# Patient Record
Sex: Female | Born: 1980 | Race: Black or African American | Hispanic: No | Marital: Single | Smoking: Current some day smoker
Health system: Southern US, Community
[De-identification: ages and names within clinical notes are randomized; demographics above are authoritative.]

## PROBLEM LIST (undated history)

## (undated) ENCOUNTER — Emergency Department: Admission: EM | Discharge status: Absent without leave | Payer: Medicaid Other

## (undated) DIAGNOSIS — R519 Headache, unspecified: Secondary | ICD-10-CM

## (undated) DIAGNOSIS — R112 Nausea with vomiting, unspecified: Secondary | ICD-10-CM

## (undated) DIAGNOSIS — I10 Essential (primary) hypertension: Secondary | ICD-10-CM

## (undated) DIAGNOSIS — M1990 Unspecified osteoarthritis, unspecified site: Secondary | ICD-10-CM

## (undated) DIAGNOSIS — K859 Acute pancreatitis without necrosis or infection, unspecified: Secondary | ICD-10-CM

## (undated) DIAGNOSIS — J45909 Unspecified asthma, uncomplicated: Secondary | ICD-10-CM

## (undated) DIAGNOSIS — G894 Chronic pain syndrome: Secondary | ICD-10-CM

## (undated) DIAGNOSIS — N739 Female pelvic inflammatory disease, unspecified: Secondary | ICD-10-CM

## (undated) DIAGNOSIS — R51 Headache: Secondary | ICD-10-CM

## (undated) DIAGNOSIS — K219 Gastro-esophageal reflux disease without esophagitis: Secondary | ICD-10-CM

## (undated) DIAGNOSIS — D259 Leiomyoma of uterus, unspecified: Secondary | ICD-10-CM

## (undated) DIAGNOSIS — R011 Cardiac murmur, unspecified: Secondary | ICD-10-CM

## (undated) DIAGNOSIS — E119 Type 2 diabetes mellitus without complications: Secondary | ICD-10-CM

## (undated) DIAGNOSIS — I509 Heart failure, unspecified: Secondary | ICD-10-CM

## (undated) DIAGNOSIS — Z8619 Personal history of other infectious and parasitic diseases: Secondary | ICD-10-CM

## (undated) HISTORY — DX: Chronic pain syndrome: G89.4

## (undated) HISTORY — DX: Leiomyoma of uterus, unspecified: D25.9

## (undated) HISTORY — PX: KNEE ARTHROSCOPY: SHX127

## (undated) HISTORY — DX: Female pelvic inflammatory disease, unspecified: N73.9

## (undated) HISTORY — PX: ABDOMINAL HYSTERECTOMY: SHX81

## (undated) HISTORY — PX: DENTAL SURGERY: SHX609

## (undated) HISTORY — PX: CHOLECYSTECTOMY: SHX55

## (undated) HISTORY — PX: OTHER SURGICAL HISTORY: SHX169

## (undated) HISTORY — DX: Personal history of other infectious and parasitic diseases: Z86.19

---

## 2017-05-04 ENCOUNTER — Emergency Department
Admission: EM | Admit: 2017-05-04 | Discharge: 2017-05-04 | Disposition: A | Discharge status: Home / Self Care | Payer: No Typology Code available for payment source | Attending: Emergency Medicine | Admitting: Emergency Medicine

## 2017-05-04 ENCOUNTER — Encounter: Payer: Self-pay | Admitting: Emergency Medicine

## 2017-05-04 DIAGNOSIS — F1123 Opioid dependence with withdrawal: Secondary | ICD-10-CM | POA: Insufficient documentation

## 2017-05-04 DIAGNOSIS — F17210 Nicotine dependence, cigarettes, uncomplicated: Secondary | ICD-10-CM | POA: Insufficient documentation

## 2017-05-04 DIAGNOSIS — J45909 Unspecified asthma, uncomplicated: Secondary | ICD-10-CM | POA: Diagnosis not present

## 2017-05-04 DIAGNOSIS — I10 Essential (primary) hypertension: Secondary | ICD-10-CM | POA: Insufficient documentation

## 2017-05-04 DIAGNOSIS — F1193 Opioid use, unspecified with withdrawal: Secondary | ICD-10-CM

## 2017-05-04 DIAGNOSIS — Z79899 Other long term (current) drug therapy: Secondary | ICD-10-CM | POA: Diagnosis not present

## 2017-05-04 DIAGNOSIS — M791 Myalgia, unspecified site: Secondary | ICD-10-CM | POA: Diagnosis present

## 2017-05-04 HISTORY — DX: Essential (primary) hypertension: I10

## 2017-05-04 HISTORY — DX: Unspecified asthma, uncomplicated: J45.909

## 2017-05-04 HISTORY — DX: Unspecified osteoarthritis, unspecified site: M19.90

## 2017-05-04 MED ORDER — IBUPROFEN 600 MG PO TABS: 600.0000 mg | ORAL_TABLET | Freq: Once | ORAL | Status: DC

## 2017-05-04 MED ORDER — ONDANSETRON 4 MG PO TBDP
4.0000 mg | ORAL_TABLET | Freq: Once | ORAL | Status: AC
  Administered 2017-05-04: 4 mg via ORAL
  Filled 2017-05-04: qty 1

## 2017-05-04 MED ORDER — CYCLOBENZAPRINE HCL 10 MG PO TABS
10.0000 mg | ORAL_TABLET | Freq: Once | ORAL | Status: AC
  Administered 2017-05-04: 10 mg via ORAL
  Filled 2017-05-04: qty 1

## 2017-05-04 MED ORDER — CLONIDINE HCL 0.1 MG PO TABS
0.2000 mg | ORAL_TABLET | Freq: Once | ORAL | Status: AC
  Administered 2017-05-04: 0.2 mg via ORAL
  Filled 2017-05-04: qty 2

## 2017-05-04 MED ORDER — CLONIDINE HCL 0.2 MG PO TABS: 0.2000 mg | ORAL_TABLET | Freq: Two times a day (BID) | ORAL | 0 refills | Status: DC

## 2017-05-04 MED ORDER — CYCLOBENZAPRINE HCL 10 MG PO TABS: 10.0000 mg | ORAL_TABLET | Freq: Three times a day (TID) | ORAL | 0 refills | Status: DC | PRN

## 2017-05-04 NOTE — Discharge Instructions (Signed)
Please take your clonidine as needed twice a day for withdrawal symptoms.  Follow up in pain clinic this coming week for a recheck.  It was a pleasure to take care of you today, and thank you for coming to our emergency department.  If you have any questions or concerns before leaving please ask the nurse to grab me and I'm more than happy to go through your aftercare instructions again.  If you were prescribed any opioid pain medication today such as Norco, Vicodin, Percocet, morphine, hydrocodone, or oxycodone please make sure you do not drive when you are taking this medication as it can alter your ability to drive safely.  If you have any concerns once you are home that you are not improving or are in fact getting worse before you can make it to your follow-up appointment, please do not hesitate to call 911 and come back for further evaluation.  Darel Hong, MD

## 2017-05-04 NOTE — ED Triage Notes (Signed)
Pt present to ED 01 via EMS from home c/o pain in both knees, between the shoulder and lower back; pt states pain is 10/10 at all sites; per EMS pt was hypertensive, with BP of 201/113, all other VS are WDL; at this time, pt is awake, alert and oriented x4; pt was talking on the phone when they arrived via EMS; pt has family at bedside at this time.

## 2017-05-04 NOTE — ED Provider Notes (Signed)
[facility] Emergency Department Provider Note  ____________________________________________   First MD Initiated Contact with Patient 05/04/17 1622     (approximate)  I have reviewed the triage vital signs and the nursing notes.   HISTORY  Chief Complaint Knee Pain   HPI Sarah Sosa is a 36 y.o. female who comes to the emergency department via EMS with severe 10 out of 10 diffuse full body pain, nausea, myalgias, headache, insomnia, and fatigue. She has been taking 7.5 mg Percocet 1-2 tablets every 4 hours for many years for her chronic pain. 5 days ago her primary care physician abruptly cut her off of Percocet she has not had any pain medication since. Her symptoms began gradually but is been slowly progressive and are now severe. He attempted to place her on a new medication for her pain however she cannot afford it.   Past Medical History:  Diagnosis Date  . Arthritis   . Asthma   . Hypertension     There are no active problems to display for this patient.   Past Surgical History:  Procedure Laterality Date  . CHOLECYSTECTOMY    . kenn surgery Bilateral     Prior to Admission medications   Medication Sig Start Date End Date Taking? Authorizing Provider  ALPRAZolam (XANAX) 0.25 MG tablet Take 0.25 mg by mouth 2 (two) times daily.   Yes [provider]  DULoxetine (CYMBALTA) 60 MG capsule Take 60 mg by mouth daily. with food 05/02/17  Yes [provider]  oxyCODONE (OXY IR/ROXICODONE) 5 MG immediate release tablet Take 5 mg by mouth 2 (two) times daily. 04/06/17  Yes [provider]  cloNIDine (CATAPRES) 0.2 MG tablet Take 1 tablet (0.2 mg total) by mouth 2 (two) times daily. 05/04/17 05/04/18  Darel Hong, MD  cyclobenzaprine (FLEXERIL) 10 MG tablet Take 1 tablet (10 mg total) by mouth 3 (three) times daily as needed for muscle spasms. 05/04/17   Darel Hong, MD  oxyCODONE-acetaminophen (PERCOCET) 7.5-325 MG  tablet Take 1 tablet by mouth every 4 (four) hours as needed for severe pain.    [provider]    Allergies Tramadol  No family history on file.  Social History Social History  Substance Use Topics  . Smoking status: Current Every Day Smoker    Packs/day: 0.50    Types: Cigarettes  . Smokeless tobacco: Never Used  . Alcohol use No    Review of Systems Constitutional: positive fevers and chills Eyes: No visual changes. ENT: No sore throat. Cardiovascular: positive for chest pain. Respiratory: Denies shortness of breath. Gastrointestinal: positive forabdominal pain.  positive for nausea, no vomiting.  No diarrhea.  No constipation. Genitourinary: Negative for dysuria. Musculoskeletal: positive for for back pain. Skin: Negative for rash. Neurological: positive for headache   ____________________________________________   PHYSICAL EXAM:  VITAL SIGNS: ED Triage Vitals  Enc Vitals Group     BP      Pulse      Resp      Temp      Temp src      SpO2      Weight      Height      Head Circumference      Peak Flow      Pain Score      Pain Loc      Pain Edu?      Excl. in [place]?     Constitutional: alert and oriented 4 appears uncomfortable tearful nontoxic no  diaphoresis speaks in full clear sentences Eyes: PERRL EOMI. pupils 5 mm and brisk Head: Atraumatic. Nose: No congestion/rhinnorhea. Mouth/Throat: No trismus Neck: No stridor.   Cardiovascular: Normal rate, regular rhythm. Grossly normal heart sounds.  Good peripheral circulation. Respiratory: increased respiratory effort.  No retractions. Lungs CTAB and moving good air Gastrointestinal: mild diffuse tenderness with no focality no peritonitis Musculoskeletal: No lower extremity edema   Neurologic:  Normal speech and language. No gross focal neurologic deficits are appreciated. Skin:  piloerection diffusely Psychiatric: Mood and affect are normal. Speech and behavior are  normal.    ____________________________________________   DIFFERENTIAL includes but not limited to  influenza, pneumonia, opioid withdrawal ____________________________________________   LABS (all labs ordered are listed, but only abnormal results are displayed)  Labs Reviewed - No data to display   __________________________________________  EKG   ____________________________________________  RADIOLOGY   ____________________________________________   PROCEDURES  Procedure(s) performed: o  Procedures  Critical Care performed: no  Observation: no ____________________________________________   INITIAL IMPRESSION / ASSESSMENT AND PLAN / ED COURSE  Pertinent labs & imaging results that were available during my care of the patient were reviewed by me and considered in my medical decision making (see chart for details).  The patient is diaphoretic, nauseated, with diffuse abdominal discomfort, hypertensive, with piloerection all in the setting of abrupt cessation of a large dose of opioid pain medications. I will treat her with clonidine, Motrin, Zofran, as well as Flexeril right now.    The patient feels slightly improved after her Flexeril and Zofran and clonidine. I had a lengthy discussion with the patient regarding opioid withdrawal and that I would not treat it with opioid pain medications. She expresses frustration but understands. Her pain specialist is at [facility] however she recently moved to [place] so I will refer her to our pain clinic here. I've given her a 1 month supply of clonidine to help her taper through her withdrawal. She is medically stable for outpatient management verbalizes understanding and agreement with the plan.  ____________________________________________   FINAL CLINICAL IMPRESSION(S) / ED DIAGNOSES  Final diagnoses:  Opiate withdrawal ([place])      NEW MEDICATIONS STARTED DURING THIS VISIT:  Discharge Medication List as of  05/04/2017  5:54 PM    START taking these medications   Details  cloNIDine (CATAPRES) 0.2 MG tablet Take 1 tablet (0.2 mg total) by mouth 2 (two) times daily., Starting Fri 05/04/2017, Until Sat 05/04/2018, Print    cyclobenzaprine (FLEXERIL) 10 MG tablet Take 1 tablet (10 mg total) by mouth 3 (three) times daily as needed for muscle spasms., Starting Fri 05/04/2017, Print         Note:  This document was prepared using Dragon voice recognition software and may include unintentional dictation errors.     Darel Hong, MD 05/05/17 0001

## 2017-05-04 NOTE — ED Notes (Signed)

## 2017-05-10 ENCOUNTER — Emergency Department
Admission: EM | Admit: 2017-05-10 | Discharge: 2017-05-10 | Disposition: A | Discharge status: Home / Self Care | Payer: Medicaid Other | Attending: Emergency Medicine | Admitting: Emergency Medicine

## 2017-05-10 ENCOUNTER — Emergency Department
Admit: 2017-05-10 | Discharge: 2017-05-10 | Disposition: A | Discharge status: Home / Self Care | Payer: Medicaid Other | Attending: Emergency Medicine | Admitting: Emergency Medicine

## 2017-05-10 DIAGNOSIS — I10 Essential (primary) hypertension: Secondary | ICD-10-CM | POA: Diagnosis not present

## 2017-05-10 DIAGNOSIS — M62838 Other muscle spasm: Secondary | ICD-10-CM | POA: Diagnosis not present

## 2017-05-10 DIAGNOSIS — R9431 Abnormal electrocardiogram [ECG] [EKG]: Secondary | ICD-10-CM | POA: Insufficient documentation

## 2017-05-10 DIAGNOSIS — F17210 Nicotine dependence, cigarettes, uncomplicated: Secondary | ICD-10-CM | POA: Insufficient documentation

## 2017-05-10 DIAGNOSIS — J45909 Unspecified asthma, uncomplicated: Secondary | ICD-10-CM | POA: Insufficient documentation

## 2017-05-10 DIAGNOSIS — Z79899 Other long term (current) drug therapy: Secondary | ICD-10-CM | POA: Insufficient documentation

## 2017-05-10 LAB — ECHOCARDIOGRAM COMPLETE
Height: 66 in
Weight: 3360 oz

## 2017-05-10 LAB — BASIC METABOLIC PANEL
Anion gap: 12 (ref 5–15)
BUN: 9 mg/dL (ref 6–20)
CO2: 23 mmol/L (ref 22–32)
CREATININE: 0.76 mg/dL (ref 0.44–1.00)
Calcium: 9.7 mg/dL (ref 8.9–10.3)
Chloride: 104 mmol/L (ref 101–111)
GFR calc Af Amer: >60 mL/min (ref >60)
Glucose, Bld: 125 mg/dL — ABNORMAL HIGH (ref 65–99)
Potassium: 3.6 mmol/L (ref 3.5–5.1)
SODIUM: 139 mmol/L (ref 135–145)

## 2017-05-10 LAB — CBC
HEMATOCRIT: 50.4 % — AB (ref 35.0–47.0)
HEMOGLOBIN: 16.5 g/dL — AB (ref 12.0–16.0)
MCH: 29.9 pg (ref 26.0–34.0)
MCHC: 32.7 g/dL (ref 32.0–36.0)
MCV: 91.5 fL (ref 80.0–100.0)
PLATELETS: 263 10*3/uL (ref 150–440)
RBC: 5.51 MIL/uL — AB (ref 3.80–5.20)
RDW: 13 % (ref 11.5–14.5)
WBC: 5.7 10*3/uL (ref 3.6–11.0)

## 2017-05-10 LAB — CK: Total CK: 50 U/L (ref 38–234)

## 2017-05-10 LAB — TROPONIN I
TROPONIN I: 0.03 ng/mL — AB (ref <0.03)
Troponin I: 0.04 ng/mL (ref <0.03)

## 2017-05-10 MED ORDER — CYCLOBENZAPRINE HCL 10 MG PO TABS
10.0000 mg | ORAL_TABLET | Freq: Once | ORAL | Status: AC
  Administered 2017-05-10: 10 mg via ORAL

## 2017-05-10 MED ORDER — SODIUM CHLORIDE 0.9 % IV BOLUS (SEPSIS)
1000.0000 mL | Freq: Once | INTRAVENOUS | Status: AC
  Administered 2017-05-10: 1000 mL via INTRAVENOUS

## 2017-05-10 MED ORDER — ACETAMINOPHEN 500 MG PO TABS: 1000.0000 mg | ORAL_TABLET | Freq: Once | ORAL | Status: DC

## 2017-05-10 MED ORDER — CYCLOBENZAPRINE HCL 10 MG PO TABS
ORAL_TABLET | ORAL | Status: AC
  Administered 2017-05-10: 10 mg via ORAL
  Filled 2017-05-10: qty 1

## 2017-05-10 NOTE — ED Notes (Signed)
Date and time results received: 05/10/17 1503 (use smartphrase ".now" to insert current time)  Test: Troponin I Critical Value: 0.03  Name of Provider Notified: Dr. Alfred Levins  Orders Received? Or Actions Taken?: no new orders at this time

## 2017-05-10 NOTE — Progress Notes (Signed)
Attempted to draw blood x 2 two.  Patient only allowed nurse and nurse tech to attempt in hand.  Patient refused for arms to be assessed for blood draw.  Patient would not remove jacket.

## 2017-05-10 NOTE — ED Notes (Signed)
2nd troponin drawn. No blood return from IV, pt stuck with butterfly 25G x2 to obtain sample. Sent with difficult collection tag.

## 2017-05-10 NOTE — Progress Notes (Signed)
*  PRELIMINARY RESULTS* Echocardiogram 2D Echocardiogram has been performed.  Sherrie Sport 05/10/2017, 4:07 PM

## 2017-05-10 NOTE — ED Notes (Signed)
Pt states "This isn't fucking helping me. All they gave me was flexeril. I could've stayed at home and done that shit. I'm about to just go." EDP notified of pt statements and wish to leave. EDP to bedside at this time.

## 2017-05-10 NOTE — ED Triage Notes (Signed)
Patient reports taking klonopin and a muscle relaxer this morning at 06:30 but does not know strength or exact name.

## 2017-05-10 NOTE — ED Provider Notes (Signed)
[facility] Emergency Department Provider Note  ____________________________________________  Time seen: Approximately 2:27 PM  I have reviewed the triage vital signs and the nursing notes.   HISTORY  Chief Complaint Spasms   HPI Sarah Sosa is a 36 y.o. female with a history of chronic pain and hypertension who presents for evaluation of muscle spasms. Patient reports that she woke up at 4 AM this morning having spasms of her 4 extremities. Worse on the right upper extremity. These episodes are intermittent and resolved with no intervention. She denies ever having similar symptoms. Patient was seen here 6 days ago after running out of her chronic opiates. She was prescribed clonidine which she has been taking home. She reports that yesterday she found 2 Percocets inside her purse and took them for her pain. She hasn't had any since yesterday. She smokes marijuana but denies any other drugs. She denies chest pain, shortness of breath, dizziness, nausea, vomiting, fever or chills, dysuria or hematuria.  Past Medical History:  Diagnosis Date  . Arthritis   . Asthma   . Hypertension     There are no active problems to display for this patient.   Past Surgical History:  Procedure Laterality Date  . CHOLECYSTECTOMY    . kenn surgery Bilateral     Prior to Admission medications   Medication Sig Start Date End Date Taking? Authorizing Provider  ALPRAZolam (XANAX) 0.25 MG tablet Take 0.25 mg by mouth 2 (two) times daily.   Yes [provider]  cloNIDine (CATAPRES) 0.2 MG tablet Take 1 tablet (0.2 mg total) by mouth 2 (two) times daily. 05/04/17 05/04/18 Yes Darel Hong, MD  cyclobenzaprine (FLEXERIL) 10 MG tablet Take 1 tablet (10 mg total) by mouth 3 (three) times daily as needed for muscle spasms. 05/04/17  Yes Darel Hong, MD  oxyCODONE-acetaminophen (PERCOCET) 7.5-325 MG tablet Take 1 tablet by mouth every 4 (four) hours as needed for  severe pain.   Yes [provider]    Allergies Tramadol  History reviewed. No pertinent family history.  Social History Social History  Substance Use Topics  . Smoking status: Current Every Day Smoker    Packs/day: 0.50    Types: Cigarettes  . Smokeless tobacco: Never Used  . Alcohol use No    Review of Systems  Constitutional: Negative for fever. Eyes: Negative for visual changes. ENT: Negative for sore throat. Neck: No neck pain  Cardiovascular: Negative for chest pain. Respiratory: Negative for shortness of breath. Gastrointestinal: Negative for abdominal pain, vomiting or diarrhea. Genitourinary: Negative for dysuria. Musculoskeletal: Negative for back pain. + muscle spasms x 4 extremities Skin: Negative for rash. Neurological: Negative for headaches, weakness or numbness. Psych: No SI or HI  ____________________________________________   PHYSICAL EXAM:  VITAL SIGNS: ED Triage Vitals  Enc Vitals Group     BP 05/10/17 1320 (!) 190/102     Pulse Rate 05/10/17 1320 (!) 52     Resp 05/10/17 1320 16     Temp 05/10/17 1320 98.2 F (36.8 C)     Temp Source 05/10/17 1320 Oral     SpO2 05/10/17 1320 100 %     Weight 05/10/17 1318 210 lb (95.3 kg)     Height 05/10/17 1318 5\' 6"  (1.676 m)     Head Circumference --      Peak Flow --      Pain Score 05/10/17 1317 8     Pain Loc --      Pain Edu? --  Excl. in [place]? --     Constitutional: Alert and oriented. Well appearing and in no apparent distress. HEENT:      Head: Normocephalic and atraumatic.         Eyes: Conjunctivae are normal. Sclera is non-icteric.       Mouth/Throat: Mucous membranes are moist.       Neck: Supple with no signs of meningismus. Cardiovascular: Regular rate and rhythm. No murmurs, gallops, or rubs. 2+ symmetrical distal pulses are present in all extremities. No JVD. Respiratory: Normal respiratory effort. Lungs are clear to auscultation bilaterally. No wheezes, crackles, or  rhonchi.  Gastrointestinal: Soft, non tender, and non distended with positive bowel sounds. No rebound or guarding. Musculoskeletal: Nontender with normal range of motion in all extremities. No edema, cyanosis, or erythema of extremities. Neurologic: Normal speech and language. Face is symmetric. Moving all extremities. No gross focal neurologic deficits are appreciated. Skin: Skin is warm, dry and intact. No rash noted. Psychiatric: Mood and affect are normal. Speech and behavior are normal.  ____________________________________________   LABS (all labs ordered are listed, but only abnormal results are displayed)  Labs Reviewed  CBC - Abnormal; Notable for the following:       Result Value   RBC 5.51 (*)    Hemoglobin 16.5 (*)    HCT 50.4 (*)    All other components within normal limits  BASIC METABOLIC PANEL - Abnormal; Notable for the following:    Glucose, Bld 125 (*)    All other components within normal limits  TROPONIN I - Abnormal; Notable for the following:    Troponin I 0.03 (*)    All other components within normal limits  TROPONIN I - Abnormal; Notable for the following:    Troponin I 0.04 (*)    All other components within normal limits  CK  URINALYSIS, COMPLETE (UACMP) WITH MICROSCOPIC  URINE DRUG SCREEN, QUALITATIVE ([facility] ONLY)   ____________________________________________  EKG  ED ECG REPORT I, Rudene Re, the attending physician, personally viewed and interpreted this ECG.  13:37 - sinus bradycardia, rate of 52, incomplete left bundle branch block, LVH, normal QTC and normal axis, ST elevation in 1 and aVL with upward concavity of STDs segment and T-wave inversions in 3, aVF, V2 to V4. No prior for comparison.  13:46 - sinus bradycardia, rate of 52, incomplete left bundle branch block, LVH, normal QTC, normal axis, persistent ST elevation in 1 and aVL, concave up with the T-wave inversions and inferior and anterior  leads. ____________________________________________  RADIOLOGY  ECHO: - Normal LVF   Normal Wall Motion   Normal Chamber   No clear evidence of vegetation   EF=60%   Normal Right side. Normal study. No cardiac source of embolism   was identified, but cannot be ruled out on the basis of this   examination. ____________________________________________   PROCEDURES  Procedure(s) performed: None Procedures Critical Care performed:  None ____________________________________________   INITIAL IMPRESSION / ASSESSMENT AND PLAN / ED COURSE  36 y.o. female with a history of chronic pain and hypertension who presents for evaluation of muscle spasms of all 4 extremities since 4 am. patient had an EKG done in triage which was concerning for ST elevations in 1 and aVL with T-wave inversions in inferior and anterior leads. Patient denies any chest pain, back pain, or shortness of breath, she has no dizziness. I consulted Dr. Clayborn Bigness, cardiologist on call who looked at the EKG and does not believe EKG meets criteria for STEMI  especially since patient has no symptoms of a heart attack. Repeat EKG is unchanged. Patient does not have an old EKG for comparison. We'll check cardiac enzymes. In regards to her chief complain patient recently started on clonidine for with drawl from opiates. She is complaining of intermittent muscle spasms possibly from dehydration vs electrolyte abnormalities vs withdrawal. Will check labs, electrolytes, CK, drug screen. Will give IVF and flexeril.  Clinical Course as of May 10 2034  Thu May 10, 2017  1709 Patient requesting to be discharged at this time because she has three young kids at home with no adult supervision. I explained to her about her concerning EKG and the fact that her second troponin is due at this time. She is allowing [place] to send a repeat troponin and has asked that we call her home if it is concerning. She remains with no CP or SOB. Her ECHO is normal  with no evidence of wall motion abnormality. Will refer to cardiology for outpatient evaluation of abnormal EKG. Labs showing hemoconcentration with no signs of acute kidney injury or rhabdo. Will dc home at this time.  [CV]    Clinical Course User Index [CV] Alfred Levins [place], MD   _________________________ 8:35 PM on 05/10/2017 -----------------------------------------  Repeat troponin unchanged.  As part of my medical decision making, I reviewed the following data within the [place] History obtained from family, Nursing notes reviewed and incorporated, Labs reviewed , EKG interpreted , Radiograph reviewed , A consult was requested and obtained from this/these consultant(s) Cardiology, Notes from prior ED visits and [place] Controlled Substance Database    Pertinent labs & imaging results that were available during my care of the patient were reviewed by me and considered in my medical decision making (see chart for details).    ____________________________________________   FINAL CLINICAL IMPRESSION(S) / ED DIAGNOSES  Final diagnoses:  Muscle spasm  Abnormal EKG      NEW MEDICATIONS STARTED DURING THIS VISIT:  Discharge Medication List as of 05/10/2017  5:14 PM       Note:  This document was prepared using Dragon voice recognition software and may include unintentional dictation errors.    Rudene Re, MD 05/10/17 2035

## 2017-05-10 NOTE — ED Notes (Signed)
First Nurse Note:  Per EMS, patient is complaining of muscle "twitching" that began at 4am.  Patient is complaining of generalized body aches.  Denies N/V/D.

## 2017-05-10 NOTE — ED Notes (Signed)
Pt sitting up rocking back and forth in bed, crying. States flexeril is not helping her spasms. EDP [place] notified, no new orders at this time. Pt updated.

## 2017-05-10 NOTE — Discharge Instructions (Signed)
Follow up with cardiology for your abnormal EKG. Return to the ER for chest pain, shortness of breath, feeling like you are going to pass out. Drink plenty of fluids and take muscle relaxants prescribed in your last visit.

## 2017-05-10 NOTE — ED Notes (Signed)
Per discussion with EDP, pt prefers to leave AMA at this time. Does not wish to wait for results of 2nd troponin draw. Pt noted to leave exam room, ambulating with steady gait toward lobby. Family member with patient. Pt signed paper copy of AMA form.

## 2018-02-13 DIAGNOSIS — M5136 Other intervertebral disc degeneration, lumbar region: Secondary | ICD-10-CM | POA: Diagnosis present

## 2018-02-13 DIAGNOSIS — A599 Trichomoniasis, unspecified: Secondary | ICD-10-CM | POA: Diagnosis present

## 2018-02-13 DIAGNOSIS — N730 Acute parametritis and pelvic cellulitis: Principal | ICD-10-CM | POA: Diagnosis present

## 2018-02-13 DIAGNOSIS — F17200 Nicotine dependence, unspecified, uncomplicated: Secondary | ICD-10-CM | POA: Diagnosis present

## 2018-02-13 DIAGNOSIS — G8929 Other chronic pain: Secondary | ICD-10-CM | POA: Diagnosis present

## 2018-02-13 DIAGNOSIS — F1410 Cocaine abuse, uncomplicated: Secondary | ICD-10-CM | POA: Diagnosis present

## 2018-02-13 DIAGNOSIS — I700 Atherosclerosis of aorta: Secondary | ICD-10-CM | POA: Diagnosis present

## 2018-02-13 DIAGNOSIS — I10 Essential (primary) hypertension: Secondary | ICD-10-CM | POA: Diagnosis present

## 2018-02-13 DIAGNOSIS — D259 Leiomyoma of uterus, unspecified: Secondary | ICD-10-CM | POA: Diagnosis present

## 2018-02-13 MED ORDER — ACETAMINOPHEN 325 MG PO TABS
ORAL_TABLET | ORAL | Status: AC
  Administered 2018-02-14: 650 mg via ORAL
  Filled 2018-02-13: qty 2

## 2018-02-13 NOTE — ED Triage Notes (Signed)
Pt in with co mid lower back pain that started this am. States has had back pain for years since epidural during child birth. Also co fever, cold symptoms since this am. States runny nose, congestion, and cough. Also co urinary frequency.

## 2018-02-14 ENCOUNTER — Emergency Department: Payer: Medicaid Other

## 2018-02-14 ENCOUNTER — Inpatient Hospital Stay
Admission: EM | Admit: 2018-02-14 | Discharge: 2018-02-16 | DRG: 759 | Disposition: A | Discharge status: Home / Self Care | Payer: Medicaid Other | Attending: Obstetrics and Gynecology | Admitting: Obstetrics and Gynecology

## 2018-02-14 ENCOUNTER — Other Ambulatory Visit: Payer: Self-pay

## 2018-02-14 ENCOUNTER — Encounter: Payer: Self-pay | Admitting: Radiology

## 2018-02-14 DIAGNOSIS — M5136 Other intervertebral disc degeneration, lumbar region: Secondary | ICD-10-CM | POA: Diagnosis present

## 2018-02-14 DIAGNOSIS — M545 Low back pain, unspecified: Secondary | ICD-10-CM

## 2018-02-14 DIAGNOSIS — F1210 Cannabis abuse, uncomplicated: Secondary | ICD-10-CM

## 2018-02-14 DIAGNOSIS — R509 Fever, unspecified: Secondary | ICD-10-CM

## 2018-02-14 DIAGNOSIS — I700 Atherosclerosis of aorta: Secondary | ICD-10-CM | POA: Diagnosis present

## 2018-02-14 DIAGNOSIS — N739 Female pelvic inflammatory disease, unspecified: Secondary | ICD-10-CM | POA: Diagnosis present

## 2018-02-14 DIAGNOSIS — F17200 Nicotine dependence, unspecified, uncomplicated: Secondary | ICD-10-CM | POA: Diagnosis present

## 2018-02-14 DIAGNOSIS — I10 Essential (primary) hypertension: Secondary | ICD-10-CM | POA: Diagnosis present

## 2018-02-14 DIAGNOSIS — R1084 Generalized abdominal pain: Secondary | ICD-10-CM

## 2018-02-14 DIAGNOSIS — A599 Trichomoniasis, unspecified: Secondary | ICD-10-CM | POA: Diagnosis present

## 2018-02-14 DIAGNOSIS — F1410 Cocaine abuse, uncomplicated: Secondary | ICD-10-CM | POA: Diagnosis present

## 2018-02-14 DIAGNOSIS — N898 Other specified noninflammatory disorders of vagina: Secondary | ICD-10-CM | POA: Diagnosis not present

## 2018-02-14 DIAGNOSIS — A419 Sepsis, unspecified organism: Secondary | ICD-10-CM

## 2018-02-14 DIAGNOSIS — D259 Leiomyoma of uterus, unspecified: Secondary | ICD-10-CM | POA: Diagnosis present

## 2018-02-14 DIAGNOSIS — N730 Acute parametritis and pelvic cellulitis: Secondary | ICD-10-CM | POA: Diagnosis present

## 2018-02-14 DIAGNOSIS — G8929 Other chronic pain: Secondary | ICD-10-CM | POA: Diagnosis present

## 2018-02-14 HISTORY — DX: Female pelvic inflammatory disease, unspecified: N73.9

## 2018-02-14 LAB — URINE DRUG SCREEN, QUALITATIVE (ARMC ONLY)
Amphetamines, Ur Screen: NOT DETECTED
Barbiturates, Ur Screen: NOT DETECTED
Cannabinoid 50 Ng, Ur ~~LOC~~: POSITIVE — AB
Cocaine Metabolite,Ur ~~LOC~~: POSITIVE — AB
MDMA (Ecstasy)Ur Screen: NOT DETECTED
METHADONE SCREEN, URINE: NOT DETECTED
OPIATE, UR SCREEN: NOT DETECTED
Phencyclidine (PCP) Ur S: NOT DETECTED
TRICYCLIC, UR SCREEN: POSITIVE — AB

## 2018-02-14 LAB — CHLAMYDIA/NGC RT PCR (ARMC ONLY)

## 2018-02-14 LAB — CBC
HCT: 39.5 % (ref 35.0–47.0)
HEMOGLOBIN: 13.1 g/dL (ref 12.0–16.0)
MCH: 29.4 pg (ref 26.0–34.0)
MCHC: 33.2 g/dL (ref 32.0–36.0)
MCV: 88.6 fL (ref 80.0–100.0)
Platelets: 420 10*3/uL (ref 150–440)
RBC: 4.46 MIL/uL (ref 3.80–5.20)
RDW: 14.8 % — ABNORMAL HIGH (ref 11.5–14.5)
WBC: 14.6 10*3/uL — ABNORMAL HIGH (ref 3.6–11.0)

## 2018-02-14 LAB — URINALYSIS, COMPLETE (UACMP) WITH MICROSCOPIC
BILIRUBIN URINE: NEGATIVE
Bacteria, UA: NONE SEEN
Glucose, UA: NEGATIVE mg/dL
KETONES UR: NEGATIVE mg/dL
Leukocytes, UA: NEGATIVE
Nitrite: NEGATIVE
PROTEIN: 30 mg/dL — AB
RBC / HPF: >50 RBC/hpf — ABNORMAL HIGH (ref 0–5)
Specific Gravity, Urine: 1.016 (ref 1.005–1.030)
pH: 7 (ref 5.0–8.0)

## 2018-02-14 LAB — COMPREHENSIVE METABOLIC PANEL
ALK PHOS: 59 U/L (ref 38–126)
ALT: 8 U/L (ref 0–44)
AST: 14 U/L — ABNORMAL LOW (ref 15–41)
Albumin: 3.2 g/dL — ABNORMAL LOW (ref 3.5–5.0)
Anion gap: 8 (ref 5–15)
BUN: 5 mg/dL — ABNORMAL LOW (ref 6–20)
CALCIUM: 9.3 mg/dL (ref 8.9–10.3)
CO2: 25 mmol/L (ref 22–32)
CREATININE: 0.71 mg/dL (ref 0.44–1.00)
Chloride: 103 mmol/L (ref 98–111)
GFR calc non Af Amer: >60 mL/min (ref >60)
Glucose, Bld: 93 mg/dL (ref 70–99)
Potassium: 3.5 mmol/L (ref 3.5–5.1)
SODIUM: 136 mmol/L (ref 135–145)
Total Bilirubin: 0.6 mg/dL (ref 0.3–1.2)
Total Protein: 7.8 g/dL (ref 6.5–8.1)

## 2018-02-14 LAB — WET PREP, GENITAL
Sperm: NONE SEEN
Yeast Wet Prep HPF POC: NONE SEEN

## 2018-02-14 LAB — LACTIC ACID, PLASMA: Lactic Acid, Venous: 1.4 mmol/L (ref 0.5–1.9)

## 2018-02-14 LAB — PREGNANCY, URINE: Preg Test, Ur: NEGATIVE

## 2018-02-14 MED ORDER — METRONIDAZOLE IN NACL 5-0.79 MG/ML-% IV SOLN: 500.0000 mg | Freq: Once | INTRAVENOUS | Status: DC

## 2018-02-14 MED ORDER — FENTANYL CITRATE (PF) 100 MCG/2ML IJ SOLN
INTRAMUSCULAR | Status: AC
  Filled 2018-02-14: qty 2

## 2018-02-14 MED ORDER — CEFOXITIN SODIUM 2 G IV SOLR: 2.0000 g | Freq: Once | INTRAVENOUS | Status: DC

## 2018-02-14 MED ORDER — IOPAMIDOL (ISOVUE-300) INJECTION 61%
30.0000 mL | Freq: Once | INTRAVENOUS | Status: AC | PRN
  Administered 2018-02-14: 30 mL via ORAL

## 2018-02-14 MED ORDER — PRENATAL MULTIVITAMIN CH
1.0000 | ORAL_TABLET | Freq: Every day | ORAL | Status: DC
  Administered 2018-02-14 – 2018-02-16 (×3): 1 via ORAL
  Filled 2018-02-14 (×3): qty 1

## 2018-02-14 MED ORDER — ONDANSETRON HCL 4 MG/2ML IJ SOLN
4.0000 mg | Freq: Once | INTRAMUSCULAR | Status: AC
  Administered 2018-02-14: 4 mg via INTRAVENOUS
  Filled 2018-02-14: qty 2

## 2018-02-14 MED ORDER — SODIUM CHLORIDE 0.9 % IV BOLUS
1000.0000 mL | Freq: Once | INTRAVENOUS | Status: AC
  Administered 2018-02-14: 1000 mL via INTRAVENOUS

## 2018-02-14 MED ORDER — FENTANYL CITRATE (PF) 100 MCG/2ML IJ SOLN
50.0000 ug | Freq: Once | INTRAMUSCULAR | Status: AC
  Administered 2018-02-14: 50 ug via INTRAVENOUS
  Filled 2018-02-14: qty 2

## 2018-02-14 MED ORDER — ACETAMINOPHEN 325 MG PO TABS: 650.0000 mg | ORAL_TABLET | ORAL | Status: DC | PRN

## 2018-02-14 MED ORDER — OXYCODONE-ACETAMINOPHEN 7.5-325 MG PO TABS
1.0000 | ORAL_TABLET | ORAL | Status: DC | PRN
  Administered 2018-02-14 – 2018-02-16 (×11): 1 via ORAL
  Filled 2018-02-14 (×11): qty 1

## 2018-02-14 MED ORDER — KETOROLAC TROMETHAMINE 30 MG/ML IJ SOLN
30.0000 mg | Freq: Four times a day (QID) | INTRAMUSCULAR | Status: AC
  Administered 2018-02-14 – 2018-02-15 (×4): 30 mg via INTRAVENOUS
  Filled 2018-02-14 (×4): qty 1

## 2018-02-14 MED ORDER — SODIUM CHLORIDE 0.9 % IV SOLN
100.0000 mg | Freq: Once | INTRAVENOUS | Status: DC
  Filled 2018-02-14: qty 100

## 2018-02-14 MED ORDER — ACETAMINOPHEN 325 MG PO TABS
650.0000 mg | ORAL_TABLET | Freq: Once | ORAL | Status: AC
  Administered 2018-02-14: 650 mg via ORAL

## 2018-02-14 MED ORDER — GADOBENATE DIMEGLUMINE 529 MG/ML IV SOLN
20.0000 mL | Freq: Once | INTRAVENOUS | Status: AC | PRN
  Administered 2018-02-14: 19 mL via INTRAVENOUS

## 2018-02-14 MED ORDER — DOXYCYCLINE HYCLATE 100 MG PO TABS
100.0000 mg | ORAL_TABLET | Freq: Two times a day (BID) | ORAL | Status: DC
  Administered 2018-02-14 – 2018-02-16 (×4): 100 mg via ORAL
  Filled 2018-02-14 (×5): qty 1

## 2018-02-14 MED ORDER — FENTANYL CITRATE (PF) 100 MCG/2ML IJ SOLN
50.0000 ug | Freq: Once | INTRAMUSCULAR | Status: AC
  Administered 2018-02-14: 50 ug via INTRAVENOUS

## 2018-02-14 MED ORDER — SODIUM CHLORIDE 0.9 % IV SOLN
2.0000 g | Freq: Four times a day (QID) | INTRAVENOUS | Status: DC
  Administered 2018-02-14 – 2018-02-16 (×7): 2 g via INTRAVENOUS
  Filled 2018-02-14 (×11): qty 2

## 2018-02-14 MED ORDER — METRONIDAZOLE 500 MG PO TABS
2000.0000 mg | ORAL_TABLET | Freq: Once | ORAL | Status: AC
  Administered 2018-02-14: 2000 mg via ORAL
  Filled 2018-02-14 (×2): qty 4

## 2018-02-14 MED ORDER — IOHEXOL 350 MG/ML SOLN
100.0000 mL | Freq: Once | INTRAVENOUS | Status: AC | PRN
  Administered 2018-02-14: 80 mL via INTRAVENOUS

## 2018-02-14 MED ORDER — SODIUM CHLORIDE 0.9 % IV SOLN
2.0000 g | Freq: Once | INTRAVENOUS | Status: AC
  Administered 2018-02-14: 2 g via INTRAVENOUS
  Filled 2018-02-14 (×2): qty 2

## 2018-02-14 MED ORDER — SODIUM CHLORIDE 0.9 % IV SOLN
100.0000 mg | Freq: Two times a day (BID) | INTRAVENOUS | Status: DC
  Filled 2018-02-14: qty 100

## 2018-02-14 MED ORDER — DEXTROSE IN LACTATED RINGERS 5 % IV SOLN
INTRAVENOUS | Status: DC
  Administered 2018-02-14 – 2018-02-16 (×6): via INTRAVENOUS

## 2018-02-14 NOTE — ED Notes (Signed)
Pt taken to MRI. Family member remains at bedside.

## 2018-02-14 NOTE — ED Notes (Addendum)
1 unsuccessful 22g PIV attempt by this RN (right hand). Pt agrees to allow this RN to attempt an US-guided PIV which was successful on 1st attempt.

## 2018-02-14 NOTE — ED Notes (Signed)
Pt requesting food and drink. Per EDP, pt should remain NPO at this time d/t MRI needed for possible epidural abscess. Per MRI tech, pt's exam cannot be done until 1400 today d/t case list and pt needing multiple exams requiring extended time in MRI. Explained to pt need to remain NPO at this time d/t aspiration risk if surgical intervention is required. Pt expressing frustration with wait but understanding of reasoning. Pt's family member also upset, swearing at staff but verbalizes understanding.

## 2018-02-14 NOTE — ED Provider Notes (Signed)
Signout from Dr. Beather Arbour in this 37 year old female who is presenting with fever as well as back pain.  Patient also found to have diffuse abdominal tenderness and is pending CAT scan of the abdomen and pelvis.  Dr. Beather Arbour reports that the patient was a "difficult stick."  Patient with multiple positive results on her urine drug screen.  Physical Exam  BP (!) 157/99   Pulse (!) 102   Temp 99.4 F (37.4 C) (Oral)   Resp 20   Ht 5\' 6"  (1.676 m)   Wt 94.3 kg (208 lb)   LMP 01/31/2018   SpO2 98%   BMI 33.57 kg/m  ----------------------------------------- 7:52 AM on 02/14/2018 -----------------------------------------   Physical Exam Patient at this time able to give me answers to questions completely.  No distress at this time.  However, does have tenderness diffusely to the midline spine from the cervical spine down to the lumbar spine.  Very minimal tenderness to the abdomen diffusely. ED Course/Procedures   Clinical Course as of Feb 15 751  Thu Feb 14, 2018  8889 Patient proving to be a difficult IV stick.  IV team was consulted.  UDS notable for cocaine metabolites, positive cannabinoids and tricyclics.   [JS]  [id number] Patient soundly asleep.  Nurse reported patient had immediate relief after IV fentanyl.   [JS]  [id number] Patient currently in CT scan.  She was only able to drink 1 bottle of oral contrast because she kept falling asleep.  Care will be transferred to the oncoming provider Dr. Clearnce Hasten pending results of CT scan.  Blood pressure improved 157/90s.   [JS]  [id number] Care transferred to Dr. Clearnce Hasten; consider MRI lumbar spine to evaluate epidural abscess if CT is negative.   [JS]    Clinical Course User Index [JS] Paulette Blanch, MD    Procedures  MDM  Patient says that she is aware of the fibroid.  Concern for epidural abscess given the patient being a hard IV stick and with multiple positives on the UDS.  Patient denies IV drug use to me.  Patient also with URI symptoms.  We  will MRI the spine but possible URI with fever if negative.  ----------------------------------------- 1:58 PM on 02/14/2018 -----------------------------------------  MRI at this time does not reveal any acute pathology to explain the patient's symptoms.  Pelvic exam performed.  A large wad of paper towels was removed from the vagina.  The patient says that she placed the paper towels and is she was leaving her apartment yesterday after wiping after urinating.  She does not give an explanation that adequately explains the large amount of paper towels that was found in her vagina.  However, after the large wad of paper towels was removed which is approximately the size of a tangerine, a large amount of exudate followed.  A bimanual exam did not reveal any CMT, uterine tenderness nor adnexal tenderness to palpation.  ----------------------------------------- 2:36 PM on 02/14/2018 -----------------------------------------  Patient with evidence of trichomonas in many white blood cells on the wet prep.  Patient to be admitted to the hospital for what appears to be mild sepsis in PID.  We will also treat for trichomonas.  Patient admitted to Dr. Amalia Hailey of the OB/GYN service.  Patient and husband aware of diagnosis.      Orbie Pyo, MD 02/14/18 [id number]

## 2018-02-14 NOTE — ED Provider Notes (Addendum)
[facility] Emergency Department Provider Note   ____________________________________________   First MD Initiated Contact with Patient 02/14/18 [phone]     (approximate)  I have reviewed the triage vital signs and the nursing notes.   HISTORY  Chief Complaint Fever and Back Pain  Level V caveat: History limited secondary to patient's distress  HPI Sarah Sosa is a 37 y.o. female who presents to the ED from home with a chief complaint of fever, back and abdominal pain.  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  Complains of a 1 day history of fever, bilateral lower back pain, generalized abdominal pain and cold-like symptoms including runny nose, congestion and nonproductive cough.  Denies headache, vision changes, neck pain, chest pain, shortness of breath, nausea, vomiting, dysuria, diarrhea.  Denies recent travel or trauma.   Past Medical History:  Diagnosis Date  . Arthritis   . Asthma   . Hypertension     There are no active problems to display for this patient.   Past Surgical History:  Procedure Laterality Date  . CHOLECYSTECTOMY    . kenn surgery Bilateral     Prior to Admission medications   Medication Sig Start Date End Date Taking? Authorizing Provider  ALPRAZolam (XANAX) 0.25 MG tablet Take 0.25 mg by mouth 2 (two) times daily.    [provider]  cloNIDine (CATAPRES) 0.2 MG tablet Take 1 tablet (0.2 mg total) by mouth 2 (two) times daily. 05/04/17 05/04/18  Darel Hong, MD  cyclobenzaprine (FLEXERIL) 10 MG tablet Take 1 tablet (10 mg total) by mouth 3 (three) times daily as needed for muscle spasms. 05/04/17   Darel Hong, MD  oxyCODONE-acetaminophen (PERCOCET) 7.5-325 MG tablet Take 1 tablet by mouth every 4 (four) hours as needed for severe pain.    [provider]    Allergies Tramadol  No family history on file.  Social History Social History   Tobacco Use  . Smoking status:  Current Every Day Smoker    Packs/day: 0.50    Types: Cigarettes  . Smokeless tobacco: Never Used  Substance Use Topics  . Alcohol use: No  . Drug use: Yes    Types: Marijuana    Review of Systems  Constitutional: Positive for fever. Eyes: No visual changes. ENT: No sore throat. Cardiovascular: Denies chest pain. Respiratory: Denies shortness of breath. Gastrointestinal: Positive for abdominal pain.  No nausea, no vomiting.  No diarrhea.  No constipation. Genitourinary: Negative for dysuria. Musculoskeletal: Positive for back pain. Skin: Negative for rash. Neurological: Negative for headaches, focal weakness or numbness.   ____________________________________________   PHYSICAL EXAM:  VITAL SIGNS: ED Triage Vitals [02/14/18 0000]  Enc Vitals Group     BP (!) 168/115     Pulse Rate (!) 105     Resp 20     Temp (!) 102.5 F (39.2 C)     Temp Source Oral     SpO2 100 %     Weight 208 lb (94.3 kg)     Height 5\' 6"  (1.676 m)     Head Circumference      Peak Flow      Pain Score 10     Pain Loc      Pain Edu?      Excl. in [place]?     Constitutional: Alert and oriented. Well appearing and in moderate acute distress.  Tearful. Eyes: Conjunctivae are normal. PERRL. EOMI. Head: Atraumatic. Nose: No congestion/rhinnorhea. Mouth/Throat: Mucous membranes are moist.  Oropharynx non-erythematous. Neck: No stridor.  Supple neck without meningismus. Cardiovascular: Normal rate, regular rhythm. Grossly normal heart sounds.  Good peripheral circulation. Respiratory: Normal respiratory effort.  No retractions. Lungs CTAB. Gastrointestinal: Soft and mildly diffusely tender to palpation without rebound or guarding. No distention. No abdominal bruits.  Mild bilateral CVA tenderness. Musculoskeletal: No lower extremity tenderness nor edema.  No joint effusions. Neurologic:  Normal speech and language. No gross focal neurologic deficits are appreciated. No gait instability. Skin:   Skin is warm, dry and intact. No rash noted. Psychiatric: Mood and affect are normal. Speech and behavior are normal.  ____________________________________________   LABS (all labs ordered are listed, but only abnormal results are displayed)  Labs Reviewed  CBC - Abnormal; Notable for the following components:      Result Value   WBC 14.6 (*)    RDW 14.8 (*)    All other components within normal limits  COMPREHENSIVE METABOLIC PANEL - Abnormal; Notable for the following components:   BUN 5 (*)    Albumin 3.2 (*)    AST 14 (*)    All other components within normal limits  URINALYSIS, COMPLETE (UACMP) WITH MICROSCOPIC - Abnormal; Notable for the following components:   Color, Urine YELLOW (*)    APPearance CLEAR (*)    Hgb urine dipstick MODERATE (*)    Protein, ur 30 (*)    RBC / HPF >50 (*)    All other components within normal limits  URINE DRUG SCREEN, QUALITATIVE ([facility] ONLY) - Abnormal; Notable for the following components:   Tricyclic, Ur Screen POSITIVE (*)    Cocaine Metabolite,Ur [place] POSITIVE (*)    Cannabinoid 50 Ng, Ur [place] POSITIVE (*)    All other components within normal limits  LACTIC ACID, PLASMA  PREGNANCY, URINE  POC URINE PREG, ED   ____________________________________________  EKG  None ____________________________________________  RADIOLOGY  ED MD interpretation: No acute cardiopulmonary process  Official radiology report(s): Dg Chest 2 View  Result Date: 02/14/2018 CLINICAL DATA:  Cough.  Fever. EXAM: CHEST - 2 VIEW COMPARISON:  None. FINDINGS: The cardiomediastinal contours are normal. Heart size upper normal. Minimal subsegmental atelectasis or scarring in the left upper lung zone. Pulmonary vasculature is normal. No consolidation, pleural effusion, or pneumothorax. No acute osseous abnormalities are seen. IMPRESSION: No acute pulmonary process. Electronically Signed   By: Jeb Levering M.D.   On: 02/14/2018 00:52     ____________________________________________   PROCEDURES  Procedure(s) performed: None  Procedures  Critical Care performed: No  ____________________________________________   INITIAL IMPRESSION / ASSESSMENT AND PLAN / ED COURSE  As part of my medical decision making, I reviewed the following data within the [place] History obtained from family, Nursing notes reviewed and incorporated, Labs reviewed, Old chart reviewed, Radiograph reviewed  and Notes from prior ED visits   37 year old female who presents with fever, abdominal and back pain. Differential diagnosis includes, but is not limited to, acute appendicitis, diverticulitis, urinary tract infection/pyelonephritis, endometriosis, bowel obstruction, colitis, renal colic, gastroenteritis, hernia, fibroids, endometriosis, pregnancy related pain including ectopic pregnancy, etc.  Laboratory urinalysis results remarkable for mild leukocytosis, RBC and urinalysis, negative lactic acid, unremarkable chest x-ray.  Will initiate IV fluid resuscitation, 50 mcg IV fentanyl for pain paired with 4 mg IV Zofran for nausea, and proceed to CT abdomen/pelvis to evaluate for intra-abdominal etiology of patient's pain.  Noted hypertension which hopefully will improve after pain controlled.   Clinical Course as of Feb 15 723  Thu Feb 14, 2018  1884 Patient proving to be a difficult IV stick.  IV team was consulted.  UDS notable for cocaine metabolites, positive cannabinoids and tricyclics.   [JS]  [id number] Patient soundly asleep.  Nurse reported patient had immediate relief after IV fentanyl.   [JS]  [id number] Patient currently in CT scan.  She was only able to drink 1 bottle of oral contrast because she kept falling asleep.  Care will be transferred to the oncoming provider Dr. Clearnce Hasten pending results of CT scan.  Blood pressure improved 157/90s.   [JS]  [id number] Care transferred to Dr. Clearnce Hasten; consider MRI lumbar spine to  evaluate epidural abscess if CT is negative.   [JS]    Clinical Course User Index [JS] Paulette Blanch, MD     ____________________________________________   FINAL CLINICAL IMPRESSION(S) / ED DIAGNOSES  Final diagnoses:  Fever, unspecified fever cause  Acute bilateral low back pain without sciatica  Generalized abdominal pain  Cocaine abuse ([place])  Marijuana abuse     ED Discharge Orders    None       Note:  This document was prepared using Dragon voice recognition software and may include unintentional dictation errors.    Paulette Blanch, MD 02/14/18 1660    Paulette Blanch, MD 02/14/18 [id number]

## 2018-02-14 NOTE — H&P (Signed)
Subjective:    Patient presented to the emergency department last night with complaint of back pain.  She underwent an extensive work-up for disc disease including MRI and CT.  Her CT revealed a large pelvic mass consistent with uterine fibroids.  She states that she is well aware of her fibroids and has seen doctors at [facility] on many occasions regarding these fibroids.  She is considering hysterectomy. She had a low-grade fever upon presentation to the emergency department.  Objective:    Patient Vitals for the past 2 hrs:  BP Temp Temp src Pulse Resp SpO2  02/14/18 1553 127/74 99 F (37.2 C) Oral 83 18 98 %   No intake/output data recorded.  Labs: Results for orders placed or performed during the hospital encounter of 02/14/18 (from the past 24 hour(s))  CBC     Status: Abnormal   Collection Time: 02/14/18 12:14 AM  Result Value Ref Range   WBC 14.6 (H) 3.6 - 11.0 K/uL   RBC 4.46 3.80 - 5.20 MIL/uL   Hemoglobin 13.1 12.0 - 16.0 g/dL   HCT 39.5 35.0 - 47.0 %   MCV 88.6 80.0 - 100.0 fL   MCH 29.4 26.0 - 34.0 pg   MCHC 33.2 32.0 - 36.0 g/dL   RDW 14.8 (H) 11.5 - 14.5 %   Platelets 420 150 - 440 K/uL  Comprehensive metabolic panel     Status: Abnormal   Collection Time: 02/14/18 12:14 AM  Result Value Ref Range   Sodium 136 135 - 145 mmol/L   Potassium 3.5 3.5 - 5.1 mmol/L   Chloride 103 98 - 111 mmol/L   CO2 25 22 - 32 mmol/L   Glucose, Bld 93 70 - 99 mg/dL   BUN 5 (L) 6 - 20 mg/dL   Creatinine, Ser 0.71 0.44 - 1.00 mg/dL   Calcium 9.3 8.9 - 10.3 mg/dL   Total Protein 7.8 6.5 - 8.1 g/dL   Albumin 3.2 (L) 3.5 - 5.0 g/dL   AST 14 (L) 15 - 41 U/L   ALT 8 0 - 44 U/L   Alkaline Phosphatase 59 38 - 126 U/L   Total Bilirubin 0.6 0.3 - 1.2 mg/dL   GFR calc non Af Amer >60 >60 mL/min   GFR calc Af Amer >60 >60 mL/min   Anion gap 8 5 - 15  Lactic acid, plasma     Status: None   Collection Time: 02/14/18 12:14 AM  Result Value Ref Range   Lactic Acid, Venous 1.4 0.5 - 1.9  mmol/L  Urinalysis, Complete w Microscopic     Status: Abnormal   Collection Time: 02/14/18 12:14 AM  Result Value Ref Range   Color, Urine YELLOW (A) YELLOW   APPearance CLEAR (A) CLEAR   Specific Gravity, Urine 1.016 1.005 - 1.030   pH 7.0 5.0 - 8.0   Glucose, UA NEGATIVE NEGATIVE mg/dL   Hgb urine dipstick MODERATE (A) NEGATIVE   Bilirubin Urine NEGATIVE NEGATIVE   Ketones, ur NEGATIVE NEGATIVE mg/dL   Protein, ur 30 (A) NEGATIVE mg/dL   Nitrite NEGATIVE NEGATIVE   Leukocytes, UA NEGATIVE NEGATIVE   RBC / HPF >50 (H) 0 - 5 RBC/hpf   WBC, UA 6-10 0 - 5 WBC/hpf   Bacteria, UA NONE SEEN NONE SEEN   Squamous Epithelial / LPF 0-5 0 - 5   Mucus PRESENT   Urine Drug Screen, Qualitative     Status: Abnormal   Collection Time: 02/14/18 12:14 AM  Result Value  Ref Range   Tricyclic, Ur Screen POSITIVE (A) NONE DETECTED   Amphetamines, Ur Screen NONE DETECTED NONE DETECTED   MDMA (Ecstasy)Ur Screen NONE DETECTED NONE DETECTED   Cocaine Metabolite,Ur [place] POSITIVE (A) NONE DETECTED   Opiate, Ur Screen NONE DETECTED NONE DETECTED   Phencyclidine (PCP) Ur S NONE DETECTED NONE DETECTED   Cannabinoid 50 Ng, Ur [place] POSITIVE (A) NONE DETECTED   Barbiturates, Ur Screen NONE DETECTED NONE DETECTED   Benzodiazepine, Ur Scrn UNABLE TO REPORT, REAGENT ON BACKORDER...[place] NONE DETECTED   Methadone Scn, Ur NONE DETECTED NONE DETECTED  Pregnancy, urine     Status: None   Collection Time: 02/14/18 12:14 AM  Result Value Ref Range   Preg Test, Ur NEGATIVE NEGATIVE  Wet prep, genital     Status: Abnormal   Collection Time: 02/14/18  1:53 PM  Result Value Ref Range   Yeast Wet Prep HPF POC NONE SEEN NONE SEEN   Trich, Wet Prep PRESENT (A) NONE SEEN   Clue Cells Wet Prep HPF POC PRESENT (A) NONE SEEN   WBC, Wet Prep HPF POC MANY (A) NONE SEEN   Sperm NONE SEEN     Medications    Current Discharge Medication List    CONTINUE these medications which have NOT CHANGED   Details  ALPRAZolam (XANAX)  0.25 MG tablet Take 0.25 mg by mouth 2 (two) times daily.    cyclobenzaprine (FLEXERIL) 10 MG tablet Take 1 tablet (10 mg total) by mouth 3 (three) times daily as needed for muscle spasms. Qty: 30 tablet, Refills: 0    oxyCODONE-acetaminophen (PERCOCET) 7.5-325 MG tablet Take 1 tablet by mouth 3 (three) times daily as needed for severe pain (pain).           See ED H&P   abdomen:  Soft mildly tender to palpation, midline mass consistent with enlarged uterus/fibroids.  Assessment:    Based on ED assessment of profuse vaginal discharge positive trichomonas low-grade fever is likely she has PID.   Her back pain is very likely from Uterine fibroids. Cocaine abuse is also present Patient states that she takes oxycodone regularly because of her knee problems.    Plan:    Intravenous antibiotics to treat PID-plan discharge when patient afebrile and symptoms decrease.   Patient will need oral antibiotics and discharge 2 g stat dose Flagyl for trichomonas Recommend follow-up at [facility] for possible hysterectomy and evaluation of uterine fibroids when PID resolved.  (Patient has seen [facility] multiple times for fibroids and they are aware of her care).   Finis Bud, M.D. 02/14/2018 4:22 PM

## 2018-02-15 DIAGNOSIS — N730 Acute parametritis and pelvic cellulitis: Secondary | ICD-10-CM | POA: Diagnosis present

## 2018-02-15 LAB — CBC WITH DIFFERENTIAL/PLATELET
Basophils Absolute: 0.1 10*3/uL (ref 0–0.1)
Basophils Relative: 1 %
EOS ABS: 0.3 10*3/uL (ref 0–0.7)
EOS PCT: 4 %
HCT: 34.5 % — ABNORMAL LOW (ref 35.0–47.0)
Hemoglobin: 11.6 g/dL — ABNORMAL LOW (ref 12.0–16.0)
LYMPHS ABS: 1.5 10*3/uL (ref 1.0–3.6)
Lymphocytes Relative: 21 %
MCH: 30.3 pg (ref 26.0–34.0)
MCHC: 33.6 g/dL (ref 32.0–36.0)
MCV: 90.1 fL (ref 80.0–100.0)
MONOS PCT: 10 %
Monocytes Absolute: 0.7 10*3/uL (ref 0.2–0.9)
Neutro Abs: 4.4 10*3/uL (ref 1.4–6.5)
Neutrophils Relative %: 64 %
Platelets: 346 10*3/uL (ref 150–440)
RBC: 3.83 MIL/uL (ref 3.80–5.20)
RDW: 14.7 % — ABNORMAL HIGH (ref 11.5–14.5)
WBC: 6.9 10*3/uL (ref 3.6–11.0)

## 2018-02-15 NOTE — Progress Notes (Signed)
Patient ID: Sarah Sosa, female   DOB: July 06, 1981, 37 y.o.   MRN: [id number]     Subjective:    Patient reports that her pain is better than yesterday.  Still complains of some back pain.  She complained of some "stomache" pain that she associated with Toradol use.  But when we discussed discontinuation of Toradol she declined this option and asked to continue it.  Objective:    Patient Vitals for the past 2 hrs:  BP Temp Temp src Resp SpO2  02/15/18 0900 130/84 98.1 F (36.7 C) Oral 20 100 %   Total I/O In: 240 [P.O.:240] Out: -   Labs: Results for orders placed or performed during the hospital encounter of 02/14/18 (from the past 24 hour(s))  Wet prep, genital     Status: Abnormal   Collection Time: 02/14/18  1:53 PM  Result Value Ref Range   Yeast Wet Prep HPF POC NONE SEEN NONE SEEN   Trich, Wet Prep PRESENT (A) NONE SEEN   Clue Cells Wet Prep HPF POC PRESENT (A) NONE SEEN   WBC, Wet Prep HPF POC MANY (A) NONE SEEN   Sperm NONE SEEN   Chlamydia/NGC rt PCR ([facility] only)     Status: Abnormal   Collection Time: 02/14/18  1:53 PM  Result Value Ref Range   Specimen source GC/Chlam ENDOCERVICAL    Chlamydia Tr (A) NOT DETECTED    INVALID, UNABLE TO DETERMINE THE PRESENCE OF TARGET DNA DUE TO SPECIMEN INTEGRITY. RECOLLECTION REQUESTED.   N gonorrhoeae (A) NOT DETECTED    INVALID, UNABLE TO DETERMINE THE PRESENCE OF TARGET DNA DUE TO SPECIMEN INTEGRITY. RECOLLECTION REQUESTED.  CBC WITH DIFFERENTIAL     Status: Abnormal   Collection Time: 02/15/18  5:03 AM  Result Value Ref Range   WBC 6.9 3.6 - 11.0 K/uL   RBC 3.83 3.80 - 5.20 MIL/uL   Hemoglobin 11.6 (L) 12.0 - 16.0 g/dL   HCT 34.5 (L) 35.0 - 47.0 %   MCV 90.1 80.0 - 100.0 fL   MCH 30.3 26.0 - 34.0 pg   MCHC 33.6 32.0 - 36.0 g/dL   RDW 14.7 (H) 11.5 - 14.5 %   Platelets 346 150 - 440 K/uL   Neutrophils Relative % 64 %   Neutro Abs 4.4 1.4 - 6.5 K/uL   Lymphocytes Relative 21 %   Lymphs Abs 1.5 1.0 - 3.6 K/uL   Monocytes Relative 10 %   Monocytes Absolute 0.7 0.2 - 0.9 K/uL   Eosinophils Relative 4 %   Eosinophils Absolute 0.3 0 - 0.7 K/uL   Basophils Relative 1 %   Basophils Absolute 0.1 0 - 0.1 K/uL    Medications    Current Discharge Medication List    CONTINUE these medications which have NOT CHANGED   Details  ALPRAZolam (XANAX) 0.25 MG tablet Take 0.25 mg by mouth 2 (two) times daily.    cyclobenzaprine (FLEXERIL) 10 MG tablet Take 1 tablet (10 mg total) by mouth 3 (three) times daily as needed for muscle spasms. Qty: 30 tablet, Refills: 0    oxyCODONE-acetaminophen (PERCOCET) 7.5-325 MG tablet Take 1 tablet by mouth 3 (three) times daily as needed for severe pain (pain).           Assessment:    PID: Patient improving.  White count decreasing.  However patient was febrile at midnight last night.  Uterine fibroids:  Very likely the source of her back pain.  Not currently bleeding.   Plan:  Continue IV antibiotics- likely discharge tomorrow.  Patient desires of return to [facility] care for possible hysterectomy regarding uterine fibroids and back pain.   Finis Bud, M.D. 02/15/2018 10:07 AM

## 2018-02-15 NOTE — Progress Notes (Signed)
Called Dr. Amalia Hailey to be aware patient is demanding to go outside to smoke. Patient is hooked to continuous IVF and taking PO pain medications. Educated patient she can walk around unit but Dr. Amalia Hailey does not want her going off the unit. Patient refused stating she will be going off the unit tonight. Will continue to monitor.

## 2018-02-16 DIAGNOSIS — N730 Acute parametritis and pelvic cellulitis: Secondary | ICD-10-CM | POA: Diagnosis not present

## 2018-02-16 MED ORDER — OXYCODONE-ACETAMINOPHEN 5-325 MG PO TABS: 1.0000 | ORAL_TABLET | Freq: Three times a day (TID) | ORAL | 0 refills | Status: DC | PRN

## 2018-02-16 MED ORDER — DOXYCYCLINE HYCLATE 100 MG PO TABS: 100.0000 mg | ORAL_TABLET | Freq: Two times a day (BID) | ORAL | 0 refills | Status: AC

## 2018-02-16 MED ORDER — KETOROLAC TROMETHAMINE 10 MG PO TABS
10.0000 mg | ORAL_TABLET | Freq: Two times a day (BID) | ORAL | Status: DC
  Filled 2018-02-16 (×3): qty 1

## 2018-02-16 MED ORDER — DOXYCYCLINE HYCLATE 100 MG PO TABS: 100.0000 mg | ORAL_TABLET | Freq: Two times a day (BID) | ORAL | 0 refills | Status: DC

## 2018-02-16 NOTE — Progress Notes (Addendum)
Patient bed in the highest position. RN explained safety precautions of bed being that high. 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 I told every nurse that and I'm keeping my bed up high." Patient also complaining that night shift RN "told me I couldn't go outside." RN explained safety reasoning for wanting patient to stay on the floor and that it's recommended that patients with IVs stay on the unit. Patient states "well I'm older than yall, and I didn't want to go there, but no little girl is going to tell me. I'm not asking if I can go outside, I'm telling yall I'm going outside." RN encouraged patient to call RN before getting out of bed and to alert RN before leaving the unit. Patient verbalizes understanding.

## 2018-02-16 NOTE — Progress Notes (Signed)
Patient yelled "I can't take oral Toradol because I have a stomach ulcer! Give me IV pain meds." IV RN currently in room to obtain access because left hand IV site swelling. Offered to give PRN Percocet 1 hour early- pt states "yes that will work and can you bring me a [place] sandwich too"

## 2018-02-16 NOTE — Progress Notes (Signed)
Dr. Marcelline Mates notified patient c/o back pain 10/10 and no PRN medication available until 0323 am. See new order. Will continue to assess & monitor.

## 2018-02-16 NOTE — Discharge Summary (Signed)
Physician Discharge Summary  Patient ID: Sarah Sosa MRN: [id number] DOB/AGE: Aug 27, 1980 37 y.o.  Admit date: 02/14/2018 Discharge date: 02/16/2018  Admission Diagnoses:  Discharge Diagnoses:  Active Problems:   PID (pelvic inflammatory disease)   PID (acute pelvic inflammatory disease)   Discharged Condition: fair  Hospital Course: The patient was admitted to the hospital on 02/14/2018 for treatment of PID after initial presentation to the ER for purulent vaginal discharge, febrile morbidity, and pelvic pain.  Patient was also noted to have an enlarged uterus (of which patient had previously noted a known long-time history of fibroids). She was treated with IV antibiotics (Cefoxitine) and oral antibiotics doxycycline and metronidazole.  She was afebrile after 48 hours of treatment. She was discharged on HD#3.   Consults: None  Significant Diagnostic Studies: labs and microbiology Results for orders placed or performed during the hospital encounter of 02/14/18  Wet prep, genital  Result Value Ref Range   Yeast Wet Prep HPF POC NONE SEEN NONE SEEN   Trich, Wet Prep PRESENT (A) NONE SEEN   Clue Cells Wet Prep HPF POC PRESENT (A) NONE SEEN   WBC, Wet Prep HPF POC MANY (A) NONE SEEN   Sperm NONE SEEN   Chlamydia/NGC rt PCR ([facility] only)  Result Value Ref Range   Specimen source GC/Chlam ENDOCERVICAL    Chlamydia Tr (A) NOT DETECTED    INVALID, UNABLE TO DETERMINE THE PRESENCE OF TARGET DNA DUE TO SPECIMEN INTEGRITY. RECOLLECTION REQUESTED.   N gonorrhoeae (A) NOT DETECTED    INVALID, UNABLE TO DETERMINE THE PRESENCE OF TARGET DNA DUE TO SPECIMEN INTEGRITY. RECOLLECTION REQUESTED.  CBC  Result Value Ref Range   WBC 14.6 (H) 3.6 - 11.0 K/uL   RBC 4.46 3.80 - 5.20 MIL/uL   Hemoglobin 13.1 12.0 - 16.0 g/dL   HCT 39.5 35.0 - 47.0 %   MCV 88.6 80.0 - 100.0 fL   MCH 29.4 26.0 - 34.0 pg   MCHC 33.2 32.0 - 36.0 g/dL   RDW 14.8 (H) 11.5 - 14.5 %   Platelets 420 150 - 440 K/uL   Comprehensive metabolic panel  Result Value Ref Range   Sodium 136 135 - 145 mmol/L   Potassium 3.5 3.5 - 5.1 mmol/L   Chloride 103 98 - 111 mmol/L   CO2 25 22 - 32 mmol/L   Glucose, Bld 93 70 - 99 mg/dL   BUN 5 (L) 6 - 20 mg/dL   Creatinine, Ser 0.71 0.44 - 1.00 mg/dL   Calcium 9.3 8.9 - 10.3 mg/dL   Total Protein 7.8 6.5 - 8.1 g/dL   Albumin 3.2 (L) 3.5 - 5.0 g/dL   AST 14 (L) 15 - 41 U/L   ALT 8 0 - 44 U/L   Alkaline Phosphatase 59 38 - 126 U/L   Total Bilirubin 0.6 0.3 - 1.2 mg/dL   GFR calc non Af Amer >60 >60 mL/min   GFR calc Af Amer >60 >60 mL/min   Anion gap 8 5 - 15  Lactic acid, plasma  Result Value Ref Range   Lactic Acid, Venous 1.4 0.5 - 1.9 mmol/L  Urinalysis, Complete w Microscopic  Result Value Ref Range   Color, Urine YELLOW (A) YELLOW   APPearance CLEAR (A) CLEAR   Specific Gravity, Urine 1.016 1.005 - 1.030   pH 7.0 5.0 - 8.0   Glucose, UA NEGATIVE NEGATIVE mg/dL   Hgb urine dipstick MODERATE (A) NEGATIVE   Bilirubin Urine NEGATIVE NEGATIVE   Ketones, ur NEGATIVE NEGATIVE  mg/dL   Protein, ur 30 (A) NEGATIVE mg/dL   Nitrite NEGATIVE NEGATIVE   Leukocytes, UA NEGATIVE NEGATIVE   RBC / HPF >50 (H) 0 - 5 RBC/hpf   WBC, UA 6-10 0 - 5 WBC/hpf   Bacteria, UA NONE SEEN NONE SEEN   Squamous Epithelial / LPF 0-5 0 - 5   Mucus PRESENT   Urine Drug Screen, Qualitative  Result Value Ref Range   Tricyclic, Ur Screen POSITIVE (A) NONE DETECTED   Amphetamines, Ur Screen NONE DETECTED NONE DETECTED   MDMA (Ecstasy)Ur Screen NONE DETECTED NONE DETECTED   Cocaine Metabolite,Ur [place] POSITIVE (A) NONE DETECTED   Opiate, Ur Screen NONE DETECTED NONE DETECTED   Phencyclidine (PCP) Ur S NONE DETECTED NONE DETECTED   Cannabinoid 50 Ng, Ur [place] POSITIVE (A) NONE DETECTED   Barbiturates, Ur Screen NONE DETECTED NONE DETECTED   Benzodiazepine, Ur Scrn UNABLE TO REPORT, REAGENT ON BACKORDER...[place] NONE DETECTED   Methadone Scn, Ur NONE DETECTED NONE DETECTED  Pregnancy, urine   Result Value Ref Range   Preg Test, Ur NEGATIVE NEGATIVE  CBC WITH DIFFERENTIAL  Result Value Ref Range   WBC 6.9 3.6 - 11.0 K/uL   RBC 3.83 3.80 - 5.20 MIL/uL   Hemoglobin 11.6 (L) 12.0 - 16.0 g/dL   HCT 34.5 (L) 35.0 - 47.0 %   MCV 90.1 80.0 - 100.0 fL   MCH 30.3 26.0 - 34.0 pg   MCHC 33.6 32.0 - 36.0 g/dL   RDW 14.7 (H) 11.5 - 14.5 %   Platelets 346 150 - 440 K/uL   Neutrophils Relative % 64 %   Neutro Abs 4.4 1.4 - 6.5 K/uL   Lymphocytes Relative 21 %   Lymphs Abs 1.5 1.0 - 3.6 K/uL   Monocytes Relative 10 %   Monocytes Absolute 0.7 0.2 - 0.9 K/uL   Eosinophils Relative 4 %   Eosinophils Absolute 0.3 0 - 0.7 K/uL   Basophils Relative 1 %   Basophils Absolute 0.1 0 - 0.1 K/uL    MR Lumbar Spine W Wo Contrast CLINICAL DATA:  Back pain. Symptoms since epidural for childbirth in the distant past.  EXAM: MRI LUMBAR SPINE WITHOUT AND WITH CONTRAST  TECHNIQUE: Multiplanar and multiecho pulse sequences of the lumbar spine were obtained without and with intravenous contrast.  CONTRAST:  16mL MULTIHANCE GADOBENATE DIMEGLUMINE 529 MG/ML IV SOLN  COMPARISON:  CT same day.  FINDINGS: Segmentation:  5 lumbar type vertebral bodies.  Alignment:  Normal  Vertebrae:  No fracture or primary bone lesion.  Conus medullaris and cauda equina: Conus extends to the L2 level. Conus and cauda equina appear normal.  Paraspinal and other soft tissues: Possible swelling of the left kidney as shown at CT. Uterus enlargement with mass as demonstrated on recent CT.  Disc levels:  No abnormality at L1-2 or L2-3.  L3-4: Minimal disc bulge and facet hypertrophy. No compressive stenosis.  L4-5: Mild disc bulge and facet hypertrophy.  No stenosis.  L5-S1: Mild disc bulge and facet hypertrophy.  No stenosis.  After contrast administration, there is no evidence of abnormal epidural enhancement to suggest complication related to the previous epidural infusion.  IMPRESSION: No  infectious inflammatory change suspected in the lumbar spine. Lower lumbar degenerative disc disease and degenerative facet disease which could contribute to low back pain.  Possible renal inflammation.  Uterine mass as shown by CT.  Electronically Signed   By: Nelson Chimes M.D.   On: 02/14/2018 12:53 MR THORACIC SPINE W  WO CONTRAST CLINICAL DATA:  Recent epidural for childbirth. Back pain. Recent fever.  EXAM: MRI THORACIC WITHOUT AND WITH CONTRAST  TECHNIQUE: Multiplanar and multiecho pulse sequences of the thoracic spine were obtained without and with intravenous contrast.  CONTRAST:  54mL MULTIHANCE GADOBENATE DIMEGLUMINE 529 MG/ML IV SOLN  COMPARISON:  Chest radiography same day  FINDINGS: MRI THORACIC SPINE FINDINGS  Alignment:  Normal  Vertebrae: Edema and enhancement associated with the endplates at V8-9 but also to a lesser extent affecting the vertebral bodies as distal as T11. This is probably discogenic marrow change.  Cord:  No cord compression or primary cord lesion.  Paraspinal and other soft tissues: Negative  Disc levels:  No abnormality at T1-2 or T2-3.  T3-4: Shallow left posterolateral disc protrusion without apparent neural compression.  T4-5: Broad-based disc herniation more prominent towards the left with left foraminal encroachment.  T5-6: Disc bulge.  No stenosis.  Mild facet arthropathy.  T6-7: Disc bulge.  Mild facet arthropathy.  No stenosis.  T7-8: Shallow right paracentral disc herniation. No neural compression. Mild facet arthropathy.  T8-9: Disc bulge.  No stenosis.  T9-10: Minimal disc bulge.  No stenosis.  T10-11: Disc bulge.  No stenosis.  T11-12: Disc bulge.  No stenosis.  T12-L1: Normal.  IMPRESSION: No sign of infectious inflammatory disease in the thoracic region. Multilevel disc degeneration with disc bulges and shallow disc protrusions. No evidence of cord compression. Discogenic endplate changes with mild  edema and enhancement. The patient also has some midthoracic facet osteoarthritis. All these findings could be associated with back pain.  Electronically Signed   By: Nelson Chimes M.D.   On: 02/14/2018 12:46 MR Cervical Spine W or Wo Contrast CLINICAL DATA:  Fever and back pain following epidural at childbirth.  EXAM: MRI CERVICAL SPINE WITHOUT AND WITH CONTRAST  TECHNIQUE: Multiplanar and multiecho pulse sequences of the cervical spine, to include the craniocervical junction and cervicothoracic junction, were obtained without and with intravenous contrast.  CONTRAST:  19 cc MultiHance  COMPARISON:  None.  FINDINGS: Alignment: Normal  Vertebrae: Slightly heterogeneous marrow signal affecting the C4, C5, C6 and C7 vertebral bodies with areas of patchy increased T2 signal, low T1 signal and low level contrast enhancement.  Cord: No cord compression. Small syrinx at the C2 level extending over a length of 11 mm with a maximal diameter of 3.5 mm. No enhancement.  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  Disc levels:  No disc abnormality at C2-3 or C3-4.  C4-5: Shallow disc protrusion narrows the ventral subarachnoid space but does not compress the cord. Mild bilateral foraminal narrowing.  C5-6: Minimal disc bulge.  No stenosis.  C6-7: Shallow disc protrusion narrows the ventral subarachnoid space but does not compress the cord. Bilateral foraminal narrowing.  C7-T1: Normal.  IMPRESSION: Patchy high T2, low T1 and enhancing signal in the vertebral bodies C4 through C7. Degenerative disc disease at C4-5, C5-6 and C6-7. I think that the marrow signal is most consistent with discogenic change. Certainly, the findings could be associated with neck pain. I think it would be unlikely that this pattern was due to inflammatory or neoplastic disease. Disc  protrusions at C4-5 and C5-6 narrow the ventral subarachnoid space but do not compress the cord. Bilateral foraminal stenosis at those levels would have some potential to affect the C5 and C7 nerves.  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 Neuro surgical referral on an elective basis would be appropriate for follow-up.  Electronically Signed   By: Nelson Chimes M.D.   On: 02/14/2018 12:41 CT Abdomen Pelvis W Contrast CLINICAL DATA:  37 year old female with history of mid lower back pain since this morning. Fever.  EXAM: CT ABDOMEN AND PELVIS WITH CONTRAST  TECHNIQUE: Multidetector CT imaging of the abdomen and pelvis was performed using the standard protocol following bolus administration of intravenous contrast.  CONTRAST:  6mL OMNIPAQUE IOHEXOL 350 MG/ML SOLN  COMPARISON:  No priors.  FINDINGS: Lower chest: Unremarkable.  Hepatobiliary: No cystic or solid hepatic lesions. No intra or extrahepatic biliary ductal dilatation. Gallbladder is not visualized and is either completely collapsed or surgically absent (no surgical clips are identified in the gallbladder fossa).  Pancreas: No pancreatic mass. No pancreatic ductal dilatation. No pancreatic or peripancreatic fluid or inflammatory changes.  Spleen: Unremarkable.  Adrenals/Urinary Tract: Right kidney and bilateral adrenal glands are normal in appearance. Areas of heterogeneous hypoperfusion are noted in the left kidney, concerning for potential pyelonephritis. There is a small amount of left-sided perinephric stranding. No hydroureteronephrosis. Urinary bladder is normal in appearance.  Stomach/Bowel: Normal appearance of the stomach. No pathologic dilatation of small bowel or colon. Normal appendix.  Vascular/Lymphatic: Aortic atherosclerosis, without evidence of aneurysm or dissection in the abdominal or pelvic  vasculature. Borderline enlarged retroperitoneal lymph nodes on the left side adjacent to the renal hila measuring up to 9 mm in short axis, favored to be reactive. No other definite lymphadenopathy noted elsewhere in the abdomen or pelvis.  Reproductive: Large heterogeneously enhancing mass which appears centered in the anterior aspect of the uterine body (axial image 68 of series 2 and sagittal image 63 of series 6) measuring 14 x 13 x 9.8 cm. Ovaries are unremarkable in appearance.  Other: Trace amount of free fluid in the cul-de-sac, presumably physiologic. No larger volume of ascites. No pneumoperitoneum.  Musculoskeletal: There are no aggressive appearing lytic or blastic lesions noted in the visualized portions of the skeleton.  IMPRESSION: 1. Striated nephrogram in the left kidney with some mild perinephric stranding, concerning for left-sided pyelonephritis. Correlation with urinalysis is recommended. 2. Large mass centered in the anterior aspect of the uterine body, statistically likely a large fibroid. 3. Aortic atherosclerosis.  Electronically Signed   By: Vinnie Langton M.D.   On: 02/14/2018 07:36 DG Chest 2 View CLINICAL DATA:  Cough.  Fever.  EXAM: CHEST - 2 VIEW  COMPARISON:  None.  FINDINGS: The cardiomediastinal contours are normal. Heart size upper normal. Minimal subsegmental atelectasis or scarring in the left upper lung zone. Pulmonary vasculature is normal. No consolidation, pleural effusion, or pneumothorax. No acute osseous abnormalities are seen.  IMPRESSION: No acute pulmonary process.  Electronically Signed   By: Jeb Levering M.D.   On: 02/14/2018 00:52    Treatments: IV hydration, antibiotics: metronidazole and doxycyline, and cefoxitine. Analgesia: Fentanyl (IV), Toradol (IV), and Percocet PO  Discharge Exam: Blood pressure 105/69, pulse 61, temperature 97.8 F (36.6 C), temperature source Oral, resp. rate 18, height 5\' 6"   (1.676 m), weight 208 lb (94.3 kg), last menstrual period 01/31/2018, SpO2 98 %. General appearance: alert and no distress Resp: clear to auscultation bilaterally Cardio: regular rate and rhythm, S1, S2 normal, no murmur, click, rub or gallop GI: normal findings: bowel sounds normal and soft and abnormal findings:  moderate tenderness in the upper abdomen Pelvic: deferred Extremities: extremities normal, atraumatic, no cyanosis or  edema Neurologic: Grossly normal  Disposition:    Allergies as of 02/16/2018      Reactions   Tramadol Itching      Medication List    TAKE these medications   ALPRAZolam 0.25 MG tablet Commonly known as:  XANAX Take 0.25 mg by mouth 2 (two) times daily.   cyclobenzaprine 10 MG tablet Commonly known as:  FLEXERIL Take 1 tablet (10 mg total) by mouth 3 (three) times daily as needed for muscle spasms.   doxycycline 100 MG tablet Commonly known as:  VIBRA-TABS Take 1 tablet (100 mg total) by mouth every 12 (twelve) hours for 12 days.   oxyCODONE-acetaminophen 7.5-325 MG tablet Commonly known as:  PERCOCET Take 1 tablet by mouth 3 (three) times daily as needed for severe pain (pain). What changed:  Another medication with the same name was added. Make sure you understand how and when to take each.   oxyCODONE-acetaminophen 5-325 MG tablet Commonly known as:  PERCOCET/ROXICET Take 1-2 tablets by mouth every 8 (eight) hours as needed. What changed:  You were already taking a medication with the same name, and this prescription was added. Make sure you understand how and when to take each.        Signed: Rubie Maid 02/16/2018, 12:44 PM

## 2018-02-16 NOTE — Progress Notes (Signed)
Discharge instructions given. Patient verbalizes understanding of teaching. Patient discharged home via wheelchair at 1515.

## 2018-02-16 NOTE — Discharge Instructions (Signed)
Pelvic Inflammatory Disease Pelvic inflammatory disease (PID) is an infection in some or all of the female organs. PID can be in the uterus, ovaries, fallopian tubes, or the surrounding tissues that are inside the lower belly area (pelvis). PID can lead to lasting problems if it is not treated. To check for this disease, your doctor may:  Do a physical exam.  Do blood tests, urine tests, or a pregnancy test.  Look at your vaginal discharge.  Do tests to look inside the pelvis.  Test you for other infections.  Follow these instructions at home:  Take over-the-counter and prescription medicines only as told by your doctor.  If you were prescribed an antibiotic medicine, take it as told by your doctor. Do not stop taking it even if you start to feel better.  Do not have sex until treatment is done or as told by your doctor.  Tell your sex partner if you have PID. Your partner may need to be treated.  Keep all follow-up visits as told by your doctor. This is important.  Your doctor may test you for infection again 3 months after you are treated. Contact a doctor if:  You have more fluid (discharge) coming from your vagina or fluid that is not normal.  Your pain does not improve.  You throw up (vomit).  You have a fever.  You cannot take your medicines.  Your partner has a sexually transmitted disease (STD).  You have pain when you pee (urinate). Get help right away if:  You have more belly (abdominal) or lower belly pain.  You have chills.  You are not better after 72 hours. This information is not intended to replace advice given to you by your health care provider. Make sure you discuss any questions you have with your health care provider. Document Released: 09/29/2008 Document Revised: 12/09/2015 Document Reviewed: 08/10/2014 Elsevier Interactive Patient Education  2018 [place].    Abdominal Hysterectomy Abdominal hysterectomy is a surgical procedure to  remove the womb (uterus). The uterus is the muscular organ that houses a developing baby. This surgery may be done if:  You have cancer.  You have growths (tumors or fibroids) in the uterus.  You have long-term (chronic) pain.  You are bleeding.  Your uterus has slipped down into your vagina (uterine prolapse).  You have a condition in which the tissue that lines the uterus grows outside of its normal location (endometriosis).  You have an infection in your uterus.  You are having problems with your menstrual cycle.  Depending on why you are having this procedure, you may also have other reproductive organs removed. These could include:  The part of your vagina that connects with your uterus (cervix).  The organs that make eggs (ovaries).  The tubes that connect the ovaries to the uterus (fallopian tubes).  Tell a health care provider about:  Any allergies you have.  All medicines you are taking, including vitamins, herbs, eye drops, creams, and over-the-counter medicines.  Any problems you or family members have had with anesthetic medicines.  Any blood disorders you have.  Any surgeries you have had.  Any medical conditions you have.  Whether you are pregnant or may be pregnant. What are the risks? Generally, this is a safe procedure. However, problems may occur, including:  Bleeding.  Infection.  Allergic reactions to medicines or dyes.  Damage to other structures or organs.  Nerve injury.  Decreased interest in sex or pain during sex.  Blood clots  that can break free and travel to your lungs.  What happens before the procedure? Staying hydrated Follow instructions from your health care provider about hydration, which may include:  Up to 2 hours before the procedure - you may continue to drink clear liquids, such as water, clear fruit juice, black coffee, and plain tea  Eating and drinking restrictions Follow instructions from your health care  provider about eating and drinking, which may include:  8 hours before the procedure - stop eating heavy meals or foods such as meat, fried foods, or fatty foods.  6 hours before the procedure - stop eating light meals or foods, such as toast or cereal.  6 hours before the procedure - stop drinking milk or drinks that contain milk.  2 hours before the procedure - stop drinking clear liquids.  Medicines  Ask your health care provider about: ? Changing or stopping your regular medicines. This is especially important if you are taking diabetes medicines or blood thinners. ? Taking medicines such as aspirin and ibuprofen. These medicines can thin your blood. Do not take these medicines before your procedure if your health care provider instructs you not to.  You may be given antibiotic medicine to help prevent infection. Take it as told by your health care provider.  You may be asked to take laxatives to prevent constipation. General instructions  Ask your health care provider how your surgical site will be marked or identified.  You may be asked to shower with a germ-killing soap.  Plan to have someone take you home from the hospital.  Do not use any products that contain nicotine or tobacco, such as cigarettes and e-cigarettes. If you need help quitting, ask your health care provider.  You may have an exam or testing.  You may have a blood or urine sample taken.  You may need to have an enema to clean out your rectum and lower colon.  This procedure can affect the way you feel about yourself. Talk to your health care provider about the physical and emotional changes this procedure may cause. What happens during the procedure?  To lower your risk of infection: ? Your health care team will wash or sanitize their hands. ? Your skin will be washed with soap. ? Hair may be removed from the surgical area.  An IV tube will be inserted into one of your veins.  You will be given one  or more of the following: ? A medicine to help you relax (sedative). ? A medicine to make you fall asleep (general anesthetic).  Tight-fitting (compression) stockings will be placed on your legs to promote circulation.  A thin, flexible tube (catheter) will be inserted to help drain your urine.  The surgeon will make a cut (incision) through the skin in your lower belly. The incision may go side-to-side or up-and-down.  The surgeon will move aside the body tissue that covers your uterus. The surgeon will then carefully take out your uterus along with any of the other organs that need to be removed.  Bleeding will be controlled with clamps or sutures.  The surgeon will close your incision with stitches (sutures), skin glue, or adhesive strips.  A bandage (dressing) will be placed over the incision. The procedure may vary among health care providers and hospitals. What happens after the procedure?  You will be given pain medicine as needed.  Your blood pressure, heart rate, breathing rate, and blood oxygen level will be monitored until the medicines you  were given have worn off.  You will need to stay in the hospital to recover for one to two days. Ask your health care provider how long you will need to stay in the hospital after your procedure.  You may have a liquid diet at first. You will most likely return to your usual diet the day after surgery.  You will still have the urinary catheter in place. It will likely be removed the day after surgery.  You may have to wear compression stockings. These stockings help to prevent blood clots and reduce swelling in your legs.  You will be encouraged to walk as soon as possible. You will also use a device or do breathing exercises to keep your lungs clear.  You may need to use a sanitary napkin for vaginal discharge. Summary  Abdominal hysterectomy is a surgical procedure to remove the womb (uterus). The uterus is the muscular organ that  houses a developing baby.  This procedure can affect the way you feel about yourself. Talk to your health care provider about the physical and emotional changes this procedure may cause.  You will be given medicines for pain after the procedure.  You will need to stay in the hospital to recover. Ask your health care provider how long you will need to stay in the hospital after your procedure. This information is not intended to replace advice given to you by your health care provider. Make sure you discuss any questions you have with your health care provider. Document Released: 07/08/2013 Document Revised: 06/21/2016 Document Reviewed: 06/21/2016 Elsevier Interactive Patient Education  2017 [employer].

## 2018-02-16 NOTE — Progress Notes (Signed)
Patient seen walking toward exit of unit with spouse . Requested patient stay on unit since she's still hooked up to IVF but patient refused. Will continue to monitor.

## 2018-02-16 NOTE — Progress Notes (Addendum)
Hospital Day # 3, admitted for PID, pelvic pain, enlarged fibroid uterus  Subjective: up ad lib and tolerating PO. Patient reports that she had moderate to severe pain overnight, was not able to take PO Toradol due to having a h/o stomach ulcer.  She states that she regularly take Percot 7.5 mg tablets at home (prescribed by her pain clinic in [place]) every 6 hrs, however is getting less here.  Desires to leave the area to smoke.   Objective: Vitals:   02/16/18 0526 02/16/18 0757 02/16/18 0830 02/16/18 1225  BP: (!) 153/84 (!) 164/88 (!) 157/94 105/69  Pulse: 81 76  61  Resp: 18 20  18   Temp: 98.3 F (36.8 C) 98.4 F (36.9 C)  97.8 F (36.6 C)  TempSrc: Oral Oral  Oral  SpO2: 100% 97%  98%  Weight:      Height:         Physical Exam:  General: alert and no distress  Lungs: clear to auscultation bilaterally Heart: regular rate and rhythm, S1, S2 normal, no murmur, click, rub or gallop Abdomen: soft, moderately tender to palpation.  Pelvic mass palpable to 2-3 cm above umbilicus Pelvis:Bleeding: deferred,  Incision:  Extremities: DVT Evaluation: No evidence of DVT seen on physical exam. Negative Homan's sign. No cords or calf tenderness. No significant calf/ankle edema.   CBC Latest Ref Rng & Units 02/15/2018 02/14/2018  WBC 3.6 - 11.0 K/uL 6.9 14.6(H)  Hemoglobin 12.0 - 16.0 g/dL 11.6(L) 13.1  Hematocrit 35.0 - 47.0 % 34.5(L) 39.5  Platelets 150 - 440 K/uL 346 420     Lab Results  Component Value Date   CREATININE 0.71 02/14/2018     Assessment/Plan: PID - currently receiving IV antibiotics. Has been afebrile for over24 hours.  Can discontinue IV antibiotics and transition to PO medication.  She will be discharged home with PO course and advised to continue until completion.  Fibroid uterus - patient has in the past followed up with [facility].  Initially states that she was planning to go back to have her hysterectomy, however would now like to f/u with [facility]. Will have  patient scheduled in clinic. Discussed method of surgical intervention (likely abdominal approach given size of uterus). Advised that if she desired minimally invasive approach (robotic), then [facility] may be better suited for that request.  Chronic pain - patient with h/o chronic pain, followed by pain clinic in [place] on continuing home pain regimen once discharged, but will give a small prescription for breakthrough pain.  - HTN - patient with elevated BPs, on further discussion patient does report a h/o HTN, usually is diet controlled but has had to be on medication in the past (Lisinopril-HCTZ). Advised that if blood pressures continue to be elevated, she may need to resume medication prior to discharge.  - Tobacco abuse - patient is a regular smoker, desires to ambulate outside to smoke. Offered nicotine patch which she declined. Strongly encouraged patient to remain in hospital, however noted that she could not be held against her will.  Discharge home today. Will f/u back up in clinic next week.    A total of 25 minutes were spent face-to-face with the patient during this encounter and over half of that time involved counseling and coordination of care.   Rubie Maid, MD [facility]

## 2018-02-16 NOTE — Progress Notes (Signed)
Patient requesting that prescriptions be sent to [facility] on [address] instead of [facility] in [place]. Provider, Dr. Marcelline Mates, sent prescriptions to [place] and asked that the RN leave a message with [facility] pharmacy to cancel the prescriptions that were sent there. RN called [facility] and left a message for the pharmacist.

## 2018-02-18 ENCOUNTER — Telehealth: Payer: Self-pay | Admitting: Obstetrics and Gynecology
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
## 2018-02-18 NOTE — Telephone Encounter (Signed)
[facility] will not fill the pain medication due to she goes to a pain clinic and they are managing her pain medications.

## 2018-02-18 NOTE — Telephone Encounter (Signed)
[facility] called earlier this morning asking if [facility] could sent the rx to [place] due to the pt having a block on rx being sent to multiply pharmacies. [facility] was informed that [facility] was not in the office but would see if another provider would sent in the medication.

## 2018-02-26 ENCOUNTER — Telehealth: Payer: Self-pay | Admitting: Obstetrics and Gynecology

## 2018-02-26 NOTE — Telephone Encounter (Signed)
The patient called and stated that she would like to speak with her nurse or Dr. Marcelline Mates in regards to her pain medication and discussing her Hysterectomy. Please advise.

## 2018-02-28 NOTE — Telephone Encounter (Signed)
Pt was called to inform her that [facility] was not going to refill her pain medication and she needed to speak with her pain clinic to receive pain medications.  Pt seem to not understand and continued to tell me that [facility] could resent her another rx and send it to [facility] in [place]. Pt stated that she wanted to make an appointment with [facility] to have a hysterotomy. Pt made that appointment with the front desk staff.

## 2018-03-09 ENCOUNTER — Encounter: Payer: Self-pay | Admitting: Emergency Medicine

## 2018-03-09 ENCOUNTER — Other Ambulatory Visit: Payer: Self-pay

## 2018-03-09 ENCOUNTER — Emergency Department
Admission: EM | Admit: 2018-03-09 | Discharge: 2018-03-10 | Disposition: A | Discharge status: Home / Self Care | Payer: Medicaid Other | Attending: Emergency Medicine | Admitting: Emergency Medicine

## 2018-03-09 DIAGNOSIS — F17210 Nicotine dependence, cigarettes, uncomplicated: Secondary | ICD-10-CM | POA: Diagnosis not present

## 2018-03-09 DIAGNOSIS — R102 Pelvic and perineal pain: Secondary | ICD-10-CM | POA: Insufficient documentation

## 2018-03-09 DIAGNOSIS — B9689 Other specified bacterial agents as the cause of diseases classified elsewhere: Secondary | ICD-10-CM | POA: Diagnosis not present

## 2018-03-09 DIAGNOSIS — Z79899 Other long term (current) drug therapy: Secondary | ICD-10-CM | POA: Diagnosis not present

## 2018-03-09 DIAGNOSIS — M545 Low back pain, unspecified: Secondary | ICD-10-CM

## 2018-03-09 DIAGNOSIS — J45909 Unspecified asthma, uncomplicated: Secondary | ICD-10-CM | POA: Diagnosis not present

## 2018-03-09 DIAGNOSIS — I10 Essential (primary) hypertension: Secondary | ICD-10-CM | POA: Diagnosis not present

## 2018-03-09 DIAGNOSIS — N760 Acute vaginitis: Secondary | ICD-10-CM | POA: Diagnosis not present

## 2018-03-09 DIAGNOSIS — G8929 Other chronic pain: Secondary | ICD-10-CM | POA: Insufficient documentation

## 2018-03-09 DIAGNOSIS — D259 Leiomyoma of uterus, unspecified: Secondary | ICD-10-CM | POA: Diagnosis not present

## 2018-03-09 MED ORDER — KETOROLAC TROMETHAMINE 60 MG/2ML IM SOLN
60.0000 mg | Freq: Once | INTRAMUSCULAR | Status: AC
  Administered 2018-03-10: 60 mg via INTRAMUSCULAR
  Filled 2018-03-09: qty 2

## 2018-03-09 NOTE — ED Triage Notes (Signed)
C/O back and pelvic pain x 2 days.  Patient states she does not see pain doctor until Monday and has been out of pain medications since Thursday.  States takes Percocet 7.5/325 1 tab q 4-6 hours.

## 2018-03-10 ENCOUNTER — Emergency Department: Payer: Medicaid Other

## 2018-03-10 LAB — CHLAMYDIA/NGC RT PCR (ARMC ONLY)
Chlamydia Tr: NOT DETECTED
N GONORRHOEAE: NOT DETECTED

## 2018-03-10 LAB — WET PREP, GENITAL
Sperm: NONE SEEN
Trich, Wet Prep: NONE SEEN
Yeast Wet Prep HPF POC: NONE SEEN

## 2018-03-10 MED ORDER — LIDOCAINE 5 % EX PTCH
1.0000 | MEDICATED_PATCH | CUTANEOUS | Status: DC
  Administered 2018-03-10: 1 via TRANSDERMAL
  Filled 2018-03-10: qty 1

## 2018-03-10 MED ORDER — LIDOCAINE 5 % EX PTCH: 1.0000 | MEDICATED_PATCH | Freq: Two times a day (BID) | CUTANEOUS | 0 refills | Status: DC

## 2018-03-10 MED ORDER — METRONIDAZOLE 500 MG PO TABS
500.0000 mg | ORAL_TABLET | Freq: Once | ORAL | Status: AC
  Administered 2018-03-10: 500 mg via ORAL
  Filled 2018-03-10: qty 1

## 2018-03-10 MED ORDER — ETODOLAC 200 MG PO CAPS: 200.0000 mg | ORAL_CAPSULE | Freq: Three times a day (TID) | ORAL | 0 refills | Status: DC

## 2018-03-10 MED ORDER — METRONIDAZOLE 500 MG PO TABS: 500.0000 mg | ORAL_TABLET | Freq: Two times a day (BID) | ORAL | 0 refills | Status: AC

## 2018-03-10 MED ORDER — OXYCODONE-ACETAMINOPHEN 5-325 MG PO TABS
1.0000 | ORAL_TABLET | Freq: Once | ORAL | Status: AC
  Administered 2018-03-10: 1 via ORAL
  Filled 2018-03-10: qty 1

## 2018-03-10 NOTE — ED Provider Notes (Signed)
[facility] Emergency Department Provider Note   ____________________________________________   First MD Initiated Contact with Patient 03/09/18 2313     (approximate)  I have reviewed the triage vital signs and the nursing notes.   HISTORY  Chief Complaint Back Pain and Pelvic Pain    HPI Sarah Sosa is a 37 y.o. female who comes into the hospital today with some pelvic pain from her fibroid.  The patient states that she is in the pain clinic for knee surgeries and normally takes Percocet.  She reports that it does not stop her uterus pain.  She also has ulcers she has not been taking ibuprofen.  The patient states that when she was last seen she discussed with Dr. Marcelline Mates plans to have a hysterectomy but has not yet gone to her first official appointment.  She reports that she came back tonight because she was having pain that was beyond a 10 out of 10 in intensity.  The patient states that she is had some mild vaginal discharge but she did take antibiotics from when she was previously seen in the hospital.  The patient denies any nausea vomiting or fevers.  She is here today for evaluation of her symptoms.   Past Medical History:  Diagnosis Date  . Arthritis   . Asthma   . Hypertension     Patient Active Problem List   Diagnosis Date Noted  . PID (acute pelvic inflammatory disease) 02/15/2018  . PID (pelvic inflammatory disease) 02/14/2018    Past Surgical History:  Procedure Laterality Date  . CHOLECYSTECTOMY    . kenn surgery Bilateral     Prior to Admission medications   Medication Sig Start Date End Date Taking? Authorizing Provider  ALPRAZolam (XANAX) 0.25 MG tablet Take 0.25 mg by mouth 2 (two) times daily.    [provider]  cyclobenzaprine (FLEXERIL) 10 MG tablet Take 1 tablet (10 mg total) by mouth 3 (three) times daily as needed for muscle spasms. 05/04/17   Darel Hong, MD  etodolac (LODINE) 200 MG capsule Take 1  capsule (200 mg total) by mouth every 8 (eight) hours. 03/10/18   Loney Hering, MD  lidocaine (LIDODERM) 5 % Place 1 patch onto the skin every 12 (twelve) hours. Remove & Discard patch within 12 hours or as directed by MD 03/10/18 03/10/19  Loney Hering, MD  metroNIDAZOLE (FLAGYL) 500 MG tablet Take 1 tablet (500 mg total) by mouth 2 (two) times daily for 7 days. 03/10/18 03/17/18  Loney Hering, MD  oxyCODONE-acetaminophen (PERCOCET) 7.5-325 MG tablet Take 1 tablet by mouth 3 (three) times daily as needed for severe pain (pain).     [provider]  oxyCODONE-acetaminophen (PERCOCET/ROXICET) 5-325 MG tablet Take 1-2 tablets by mouth every 8 (eight) hours as needed. 02/16/18   Rubie Maid, MD    Allergies Latex; Tomato; and Tramadol  No family history on file.  Social History Social History   Tobacco Use  . Smoking status: Current Every Day Smoker    Packs/day: 0.50    Types: Cigarettes  . Smokeless tobacco: Never Used  Substance Use Topics  . Alcohol use: No  . Drug use: Yes    Types: Marijuana    Review of Systems  Constitutional: No fever/chills Eyes: No visual changes. ENT: No sore throat. Cardiovascular: Denies chest pain. Respiratory: Denies shortness of breath. Gastrointestinal:  abdominal pain.  No nausea, no vomiting.  No diarrhea.  No constipation. Genitourinary: Negative for dysuria. Musculoskeletal:  back pain. Skin: Negative for rash. Neurological: Negative for headaches,    ____________________________________________   PHYSICAL EXAM:  VITAL SIGNS: ED Triage Vitals  Enc Vitals Group     BP 03/09/18 2208 (!) 145/100     Pulse Rate 03/09/18 2208 91     Resp 03/09/18 2208 14     Temp 03/09/18 2208 98.2 F (36.8 C)     Temp Source 03/09/18 2208 Oral     SpO2 03/09/18 2208 99 %     Weight 03/09/18 2206 208 lb (94.3 kg)     Height 03/09/18 2206 5\' 6"  (1.676 m)     Head Circumference --      Peak Flow --      Pain Score 03/09/18  2206 10     Pain Loc --      Pain Edu? --      Excl. in [place]? --     Constitutional: Alert and oriented. Well appearing and in moderate distress. Eyes: Conjunctivae are normal. PERRL. EOMI. Head: Atraumatic. Nose: No congestion/rhinnorhea. Mouth/Throat: Mucous membranes are moist.  Oropharynx non-erythematous. Cardiovascular: Normal rate, regular rhythm. Grossly normal heart sounds.  Good peripheral circulation. Respiratory: Normal respiratory effort.  No retractions. Lungs CTAB. Gastrointestinal: Soft and nontender. No distention.  Positive bowel sounds Genitourinary: Normal external genitalia with some minimal white discharge, uterine tenderness to palpation no cervical motion tenderness to palpation Musculoskeletal: No lower extremity tenderness nor edema.   Neurologic:  Normal speech and language.  Skin:  Skin is warm, dry and intact.  Psychiatric: Mood and affect are normal.   ____________________________________________   LABS (all labs ordered are listed, but only abnormal results are displayed)  Labs Reviewed  WET PREP, GENITAL - Abnormal; Notable for the following components:      Result Value   Clue Cells Wet Prep HPF POC PRESENT (*)    WBC, Wet Prep HPF POC FEW (*)    All other components within normal limits  CHLAMYDIA/NGC RT PCR ([facility] ONLY)   ____________________________________________  EKG  none ____________________________________________  RADIOLOGY  ED MD interpretation:  US pelvis: Large uterine masses seen on recent abdominal CT statistically most likely uterine fibroid, internal cystic spaces may represent underlying degeneration, fibroid obscures the endometrium.  Official radiology report(s): US Pelvis Transvanginal Non-ob (tv Only)  Result Date: 03/10/2018 CLINICAL DATA:  Pelvic pain.  History of uterine fibroids. EXAM: TRANSABDOMINAL AND TRANSVAGINAL ULTRASOUND OF PELVIS TECHNIQUE: Both transabdominal and transvaginal ultrasound  examinations of the pelvis were performed. Transabdominal technique was performed for global imaging of the pelvis including uterus, ovaries, adnexal regions, and pelvic cul-de-sac. It was necessary to proceed with endovaginal exam following the transabdominal exam to visualize the endometrium and adnexa. COMPARISON:  CT 02/14/2018 FINDINGS: Uterus Measurements: 14.8 x 11.1 x 11.9 cm. Uterus is near completely replaced by a large 10.7 x 9.1 x 11.9 cm heterogeneous fibroid. Some internal cystic spaces may represent underlying degeneration. Endometrium Not well-defined, obscured by large uterine fibroid. Right ovary Not discretely visualized.  No adnexal mass. Left ovary Not discretely visualized, no adnexal mass. Other findings No abnormal free fluid. IMPRESSION: Large uterine masses seen on recent abdominal CT, statistically most likely uterine fibroid. Internal cystic spaces may represent underlying degeneration. Fibroid obscures the endometrium. Ovaries not visualized. Electronically Signed   By: Jeb Levering M.D.   On: 03/10/2018 01:39   US Pelvis Complete  Result Date: 03/10/2018 CLINICAL DATA:  Pelvic pain.  History of uterine fibroids. EXAM: TRANSABDOMINAL AND TRANSVAGINAL ULTRASOUND OF PELVIS  TECHNIQUE: Both transabdominal and transvaginal ultrasound examinations of the pelvis were performed. Transabdominal technique was performed for global imaging of the pelvis including uterus, ovaries, adnexal regions, and pelvic cul-de-sac. It was necessary to proceed with endovaginal exam following the transabdominal exam to visualize the endometrium and adnexa. COMPARISON:  CT 02/14/2018 FINDINGS: Uterus Measurements: 14.8 x 11.1 x 11.9 cm. Uterus is near completely replaced by a large 10.7 x 9.1 x 11.9 cm heterogeneous fibroid. Some internal cystic spaces may represent underlying degeneration. Endometrium Not well-defined, obscured by large uterine fibroid. Right ovary Not discretely visualized.  No adnexal  mass. Left ovary Not discretely visualized, no adnexal mass. Other findings No abnormal free fluid. IMPRESSION: Large uterine masses seen on recent abdominal CT, statistically most likely uterine fibroid. Internal cystic spaces may represent underlying degeneration. Fibroid obscures the endometrium. Ovaries not visualized. Electronically Signed   By: Jeb Levering M.D.   On: 03/10/2018 01:39    ____________________________________________   PROCEDURES  Procedure(s) performed: None  Procedures  Critical Care performed: No  ____________________________________________   INITIAL IMPRESSION / ASSESSMENT AND PLAN / ED COURSE  As part of my medical decision making, I reviewed the following data within the electronic MEDICAL RECORD NUMBER Notes from prior ED visits and [place] Controlled Substance Database   This is a 37 year old female who comes into the hospital today with some abdominal pain and back pain.  The patient states it is due to her fibroid tumor.  The patient had a wet prep that was positive for clue cells and the patient had an ultrasound that showed a very large fibroid tumor.  I did give the patient a dose of Toradol for her pain as well as a Percocet.  The patient also received a Lidoderm patch for her back.  I informed the patient that she does need to follow-up with OB/GYN for further evaluation and treatment of her uterine fibroid.  The patient otherwise has no other complaints.  She will be discharged home.  She received some metronidazole for her bacterial vaginosis.      ____________________________________________   FINAL CLINICAL IMPRESSION(S) / ED DIAGNOSES  Final diagnoses:  Pelvic pain  Chronic low back pain without sciatica, unspecified back pain laterality  Uterine leiomyoma, unspecified location  Bacterial vaginosis     ED Discharge Orders         Ordered    etodolac (LODINE) 200 MG capsule  Every 8 hours     03/10/18 0229    lidocaine (LIDODERM) 5 %   Every 12 hours     03/10/18 0229    metroNIDAZOLE (FLAGYL) 500 MG tablet  2 times daily     03/10/18 0230           Note:  This document was prepared using Dragon voice recognition software and may include unintentional dictation errors.    Loney Hering, MD 03/10/18 [id number]

## 2018-03-10 NOTE — ED Notes (Signed)
Pt to ultrasound

## 2018-03-10 NOTE — Discharge Instructions (Signed)
Please follow up with Dr. Marcelline Mates for further evaluation of your pain and symptoms

## 2018-03-12 ENCOUNTER — Encounter: Payer: Self-pay | Admitting: Obstetrics and Gynecology

## 2018-03-13 ENCOUNTER — Encounter: Payer: Self-pay | Admitting: Obstetrics and Gynecology

## 2018-03-19 ENCOUNTER — Other Ambulatory Visit (HOSPITAL_COMMUNITY)
Admission: RE | Admit: 2018-03-19 | Discharge: 2018-03-19 | Disposition: A | Discharge status: Home / Self Care | Payer: Medicaid Other | Source: Ambulatory Visit | Attending: Obstetrics and Gynecology | Admitting: Obstetrics and Gynecology

## 2018-03-19 ENCOUNTER — Encounter: Payer: Self-pay | Admitting: Obstetrics and Gynecology

## 2018-03-19 ENCOUNTER — Ambulatory Visit: Payer: Medicaid Other | Admitting: Obstetrics and Gynecology

## 2018-03-19 VITALS — BP 157/104 | HR 79 | Ht 66.0 in | Wt 190.1 lb

## 2018-03-19 DIAGNOSIS — N946 Dysmenorrhea, unspecified: Secondary | ICD-10-CM

## 2018-03-19 DIAGNOSIS — D259 Leiomyoma of uterus, unspecified: Secondary | ICD-10-CM | POA: Diagnosis not present

## 2018-03-19 DIAGNOSIS — I10 Essential (primary) hypertension: Secondary | ICD-10-CM | POA: Insufficient documentation

## 2018-03-19 DIAGNOSIS — N920 Excessive and frequent menstruation with regular cycle: Secondary | ICD-10-CM | POA: Diagnosis not present

## 2018-03-19 DIAGNOSIS — G894 Chronic pain syndrome: Secondary | ICD-10-CM | POA: Diagnosis not present

## 2018-03-19 DIAGNOSIS — Z8619 Personal history of other infectious and parasitic diseases: Secondary | ICD-10-CM

## 2018-03-19 DIAGNOSIS — R102 Pelvic and perineal pain: Secondary | ICD-10-CM | POA: Insufficient documentation

## 2018-03-19 DIAGNOSIS — Z124 Encounter for screening for malignant neoplasm of cervix: Secondary | ICD-10-CM

## 2018-03-19 HISTORY — DX: Leiomyoma of uterus, unspecified: D25.9

## 2018-03-19 HISTORY — DX: Personal history of other infectious and parasitic diseases: Z86.19

## 2018-03-19 HISTORY — DX: Chronic pain syndrome: G89.4

## 2018-03-19 MED ORDER — LISINOPRIL-HYDROCHLOROTHIAZIDE 10-12.5 MG PO TABS: 1.0000 | ORAL_TABLET | Freq: Every day | ORAL | 6 refills | Status: DC

## 2018-03-19 NOTE — Progress Notes (Signed)
GYNECOLOGY PROGRESS NOTE  Subjective:    Patient ID: Sarah Sosa, female    DOB: 1981-04-15, 37 y.o.   MRN: [id number]  HPI  Patient is a 37 y.o. G56P0 female who presents for follow from ER admission on 02/16/2018 for which she was admitted for treatment of PID and pelvic pain.  Of note, patient with a h/o chronic pain managed by a pain clinic in [place], however pelvic pain was more intense and she had a low-grade fever. She was diagnosed with trichomoniasis and treated. She also had a second ER visit for pain on 03/10/2018, however did not require admission for continued complaints of pain.   Of note, patient also has a long history of fibroid uterus, was being followed at [facility], however patient notes that no one seemed to acknowledge her request for a hysterectomy (although she notes that she has been requesting for several years). She has decided to transition her GYN care to [facility].   Gynecology History:  Menarche: age 34 Patient's last menstrual period was 02/26/2018. Contraception: condoms (inconsistent use) Last pap smear: 2016.  Results were: normal (reviewed in Care Everywhere)   Past Medical History:  Diagnosis Date  . Arthritis   . Asthma   . Hypertension     OB History  Gravida Para Term Preterm AB Living  3         4  SAB TAB Ectopic Multiple Live Births               # Outcome Date GA Lbr Len/2nd Weight Sex Delivery Anes PTL Lv  3 Gravida           2 Gravida           1 Gravida             Family History  Problem Relation Age of Onset  . Fibroids Mother   . Hypertension Mother   . Arthritis Mother     Past Surgical History:  Procedure Laterality Date  . CHOLECYSTECTOMY    . DENTAL SURGERY    . kenn surgery Bilateral     Social History   Socioeconomic History  . Marital status: Significant Other    Spouse name: Not on file  . Number of children: Not on file  . Years of education: Not on file  . Highest education level: Not on file    Occupational History  . Not on file  Social Needs  . Financial resource strain: Not on file  . Food insecurity:    Worry: Not on file    Inability: Not on file  . Transportation needs:    Medical: Not on file    Non-medical: Not on file  Tobacco Use  . Smoking status: Current Every Day Smoker    Packs/day: 0.50    Types: Cigarettes  . Smokeless tobacco: Never Used  Substance and Sexual Activity  . Alcohol use: No  . Drug use: Yes    Types: Marijuana  . Sexual activity: Yes    Birth control/protection: None  Lifestyle  . Physical activity:    Days per week: Not on file    Minutes per session: Not on file  . Stress: Not on file  Relationships  . Social connections:    Talks on phone: Not on file    Gets together: Not on file    Attends religious service: Not on file    Active member of club or organization: Not on file  Attends meetings of clubs or organizations: Not on file    Relationship status: Not on file  . Intimate partner violence:    Fear of current or ex partner: Not on file    Emotionally abused: Not on file    Physically abused: Not on file    Forced sexual activity: Not on file  Other Topics Concern  . Not on file  Social History Narrative  . Not on file    Current Outpatient Medications on File Prior to Visit  Medication Sig Dispense Refill  . ALPRAZolam (XANAX) 0.25 MG tablet Take 0.25 mg by mouth 2 (two) times daily.    . cyclobenzaprine (FLEXERIL) 10 MG tablet Take 1 tablet (10 mg total) by mouth 3 (three) times daily as needed for muscle spasms. 30 tablet 0  . oxyCODONE-acetaminophen (PERCOCET) 7.5-325 MG tablet Take 1 tablet by mouth 3 (three) times daily as needed for severe pain (pain).      No current facility-administered medications on file prior to visit.     Allergies  Allergen Reactions  . Latex   . Tomato   . Tramadol Itching      Review of Systems Constitutional: negative for chills, fatigue, fevers and sweats Eyes:  negative for irritation, redness and visual disturbance Ears, nose, mouth, throat, and face: negative for hearing loss, nasal congestion, snoring and tinnitus Respiratory: negative for asthma, cough, sputum Cardiovascular: negative for chest pain, dyspnea, exertional chest pressure/discomfort, irregular heart beat, palpitations and syncope Gastrointestinal: positive for abdominal pain. Negative for change in bowel habits, nausea and vomiting Genitourinary: positive for heavy and painful menstrual periods, genital lesions, sexual problems and vaginal discharge, dysuria and urinary incontinence Integument/breast: negative for breast lump, breast tenderness and nipple discharge Hematologic/lymphatic: negative for bleeding and easy bruising Musculoskeletal:negative for back pain and muscle weakness Neurological: negative for dizziness, headaches, vertigo and weakness Endocrine: negative for diabetic symptoms including polydipsia, polyuria and skin dryness Allergic/Immunologic: negative for hay fever and urticaria      Objective:   Blood pressure (!) 157/104, pulse 79, height 5\' 6"  (1.676 m), weight 190 lb 1.6 oz (86.2 kg), last menstrual period 02/26/2018. General appearance: alert and no distress Abdomen: normal findings: bowel sounds normal, no organomegaly and soft and abnormal findings:  moderate tenderness in the lower abdomen, mass arising from pelvis, midway between umbilicus and pubic symphysis  Pelvic: external genitalia normal, rectovaginal septum normal.  Vagina with moderate thin white discharge, faint odor.  Cervix normal appearing, no lesions and no motion tenderness.  Uterus enlarged, ~ 14-16 week size, mobile, normal contour, tender.   Adnexae non-palpable, nontender bilaterally.  Extremities: extremities normal, atraumatic, no cyanosis or edema Neurologic: Grossly normal   Labs:  Lab Results  Component Value Date   WBC 6.9 02/15/2018   HGB 11.6 (L) 02/15/2018   HCT 34.5 (L)  02/15/2018   MCV 90.1 02/15/2018   PLT 346 02/15/2018    CMP     Component Value Date/Time   NA 136 02/14/2018 0014   K 3.5 02/14/2018 0014   CL 103 02/14/2018 0014   CO2 25 02/14/2018 0014   GLUCOSE 93 02/14/2018 0014   BUN 5 (L) 02/14/2018 0014   CREATININE 0.71 02/14/2018 0014   CALCIUM 9.3 02/14/2018 0014   PROT 7.8 02/14/2018 0014   ALBUMIN 3.2 (L) 02/14/2018 0014   AST 14 (L) 02/14/2018 0014   ALT 8 02/14/2018 0014   ALKPHOS 59 02/14/2018 0014   BILITOT 0.6 02/14/2018 0014   GFRNONAA >60  02/14/2018 0014   GFRAA >60 02/14/2018 0014    Admission on 03/09/2018, Discharged on 03/10/2018  Component Date Value Ref Range Status  . Yeast Wet Prep HPF POC 03/10/2018 NONE SEEN  NONE SEEN Final  . Trich, Wet Prep 03/10/2018 NONE SEEN  NONE SEEN Final  . Clue Cells Wet Prep HPF POC 03/10/2018 PRESENT* NONE SEEN Final  . WBC, Wet Prep HPF POC 03/10/2018 FEW* NONE SEEN Final  . Sperm 03/10/2018 NONE SEEN   Final   Performed at [facility], [address] [place], [place] [address]  . Specimen source GC/Chlam 03/10/2018 ENDOCERVICAL   Final  . Chlamydia Tr 03/10/2018 NOT DETECTED  NOT DETECTED Final  . N gonorrhoeae 03/10/2018 NOT DETECTED  NOT DETECTED Final   Comment: (NOTE) This CT/NG assay has not been evaluated in patients with a history of  hysterectomy. Performed at [facility], [address], [place], [place] [address]     Imaging:   US Pelvis Complete CLINICAL DATA:  Pelvic pain.  History of uterine fibroids.  EXAM: TRANSABDOMINAL AND TRANSVAGINAL ULTRASOUND OF PELVIS  TECHNIQUE: Both transabdominal and transvaginal ultrasound examinations of the pelvis were performed. Transabdominal technique was performed for global imaging of the pelvis including uterus, ovaries, adnexal regions, and pelvic cul-de-sac. It was necessary to proceed with endovaginal exam following the transabdominal exam to visualize the endometrium and  adnexa.  COMPARISON:  CT 02/14/2018  FINDINGS: Uterus  Measurements: 14.8 x 11.1 x 11.9 cm. Uterus is near completely replaced by a large 10.7 x 9.1 x 11.9 cm heterogeneous fibroid. Some internal cystic spaces may represent underlying degeneration.  Endometrium  Not well-defined, obscured by large uterine fibroid.  Right ovary  Not discretely visualized.  No adnexal mass.  Left ovary  Not discretely visualized, no adnexal mass.  Other findings  No abnormal free fluid.  IMPRESSION: Large uterine masses seen on recent abdominal CT, statistically most likely uterine fibroid. Internal cystic spaces may represent underlying degeneration. Fibroid obscures the endometrium.  Ovaries not visualized.  Electronically Signed   By: Jeb Levering M.D.   On: 03/10/2018 01:39 US PELVIS TRANSVANGINAL NON-OB (TV ONLY) CLINICAL DATA:  Pelvic pain.  History of uterine fibroids.  EXAM: TRANSABDOMINAL AND TRANSVAGINAL ULTRASOUND OF PELVIS  TECHNIQUE: Both transabdominal and transvaginal ultrasound examinations of the pelvis were performed. Transabdominal technique was performed for global imaging of the pelvis including uterus, ovaries, adnexal regions, and pelvic cul-de-sac. It was necessary to proceed with endovaginal exam following the transabdominal exam to visualize the endometrium and adnexa.  COMPARISON:  CT 02/14/2018  FINDINGS: Uterus  Measurements: 14.8 x 11.1 x 11.9 cm. Uterus is near completely replaced by a large 10.7 x 9.1 x 11.9 cm heterogeneous fibroid. Some internal cystic spaces may represent underlying degeneration.  Endometrium  Not well-defined, obscured by large uterine fibroid.  Right ovary  Not discretely visualized.  No adnexal mass.  Left ovary  Not discretely visualized, no adnexal mass.  Other findings  No abnormal free fluid.  IMPRESSION: Large uterine masses seen on recent abdominal CT, statistically most likely uterine fibroid.  Internal cystic spaces may represent underlying degeneration. Fibroid obscures the endometrium.  Ovaries not visualized.  Electronically Signed   By: Jeb Levering M.D.   On: 03/10/2018 01:39   Assessment:   Enlarged fibroid uterus Pelvic pain Chronic pain disorder H/o PID (trichomoniasis) Hypertension (uncontrolled) Dysmenorrhea Heavy menses  Plan:   - Patient desires definitive management with hysterectomy.  I proposed doing a total abdominal  hysterectomy (TAH) and prophylactic bilateral salpingectomy.  No indication for oophorectomy.  Patient agrees with this proposed surgery.  The risks of surgery were discussed in detail with the patient including but not limited to: bleeding which may require transfusion or reoperation; infection which may require antibiotics; injury to bowel, bladder, ureters or other surrounding organs; need for additional procedures including laparotomy; thromboembolic phenomenon, incisional problems and other postoperative/anesthesia complications.  Patient was also advised that she will remain in house for 1-2 nights; and expected recovery time after a hysterectomy is 6-8 weeks.  Likelihood of success in alleviating the patient's symptoms was discussed.   She was told that she will be contacted by our surgical scheduler regarding the time and date of her surgery; routine preoperative instructions of having nothing to eat or drink after midnight on the day prior to surgery and also coming to the hospital 1.5 hours prior to her time of surgery were also emphasized.  She was told she may be called for a preoperative appointment about a week prior to surgery and will be given further preoperative instructions at that visit.  Routine postoperative instructions will be reviewed with the patient and her family in detail after surgery. Printed patient education handouts about the procedure was given to the patient to review at home.  Signed Medicaid Hysterectomy form  today. Scheduled for surgery 04/01/2018.  - Chronic pain managed by pain doctor in [place]. Patient has given information, will reach out to discuss perioperative and post-operative pain management. Will also plan for On-Q pump at time of surgery.  - Pap smear performed today, due for cervical cancer screening.  Also included vaginitis assessment due to slightly malodorous discharge.  Recently tested negative at last ER visit for STIs.  - Patient with h/o HTN, usually diet controlled per patient but has been on meds in the past. Will have patient restart BP meds in order to better optimize for surgery.    A total of 30 minutes were spent face-to-face with the patient during the encounter with greater than 50% dealing with counseling and coordination of care.   Rubie Maid, MD [facility]

## 2018-03-19 NOTE — Progress Notes (Signed)
Pt is present today for a consult for hysterectomy.

## 2018-03-19 NOTE — H&P (View-Only) (Signed)
GYNECOLOGY PREOPERATIVE HISTORY AND PHYSICAL   Subjective:  Sarah Sosa is a 37 y.o. [id number] here for surgical management of enlarged fibroid uterus, heavy painful menses. No significant preoperative concerns. Of note, patient was admitted to the hospital several weeks ago and treated for PID and pelvic pain.  She is also currently being managed by pain clinic for chronic pain.   Proposed surgery: Total abdominal hysterectomy with bilateral salpingectomy   Pertinent Gynecological History: Menses: regular every month without intermenstrual spotting, with moderate to severe dysmenorrhea, usually lasting 5-6 days.  Contraception: condoms Last pap: normal Date: 2016 (Reviewed in [place])   Past Medical History:  Diagnosis Date  . Arthritis   . Asthma   . Hypertension    Past Surgical History:  Procedure Laterality Date  . CHOLECYSTECTOMY    . DENTAL SURGERY    . kenn surgery Bilateral    OB History  Gravida Para Term Preterm AB Living  3         4  SAB TAB Ectopic Multiple Live Births               # Outcome Date GA Lbr Len/2nd Weight Sex Delivery Anes PTL Lv  3 Gravida           2 Gravida           1 [place]            .  Family History  Problem Relation Age of Onset  . Fibroids Mother   . Hypertension Mother   . Arthritis Mother     Social History   Socioeconomic History  . Marital status: Significant Other    Spouse name: Not on file  . Number of children: Not on file  . Years of education: Not on file  . Highest education level: Not on file  Occupational History  . Not on file  Social Needs  . Financial resource strain: Not on file  . Food insecurity:    Worry: Not on file    Inability: Not on file  . Transportation needs:    Medical: Not on file    Non-medical: Not on file  Tobacco Use  . Smoking status: Current Every Day Smoker    Packs/day: 0.50    Types: Cigarettes  . Smokeless tobacco: Never Used  Substance and Sexual Activity    . Alcohol use: No  . Drug use: Yes    Types: Marijuana  . Sexual activity: Yes    Birth control/protection: None  Lifestyle  . Physical activity:    Days per week: Not on file    Minutes per session: Not on file  . Stress: Not on file  Relationships  . Social connections:    Talks on phone: Not on file    Gets together: Not on file    Attends religious service: Not on file    Active member of club or organization: Not on file    Attends meetings of clubs or organizations: Not on file    Relationship status: Not on file  . Intimate partner violence:    Fear of current or ex partner: Not on file    Emotionally abused: Not on file    Physically abused: Not on file    Forced sexual activity: Not on file  Other Topics Concern  . Not on file  Social History Narrative  . Not on file    Current Outpatient Medications on File Prior to Visit  Medication  Sig Dispense Refill  . ALPRAZolam (XANAX) 0.25 MG tablet Take 0.25 mg by mouth 2 (two) times daily.    . cyclobenzaprine (FLEXERIL) 10 MG tablet Take 1 tablet (10 mg total) by mouth 3 (three) times daily as needed for muscle spasms. 30 tablet 0  . oxyCODONE-acetaminophen (PERCOCET) 7.5-325 MG tablet Take 1 tablet by mouth 3 (three) times daily as needed for severe pain (pain).      No current facility-administered medications on file prior to visit.    Allergies  Allergen Reactions  . Latex   . Tomato   . Tramadol Itching     Review of Systems Constitutional: No recent fever/chills/sweats Respiratory: No recent cough/bronchitis Cardiovascular: No chest pain Gastrointestinal: No recent nausea/vomiting/diarrhea Genitourinary: No UTI symptoms Hematologic/lymphatic:No history of coagulopathy or recent blood thinner use    Objective:   Blood pressure (!) 157/104, pulse 79, height 5\' 6"  (1.676 m), weight 190 lb 1.6 oz (86.2 kg), last menstrual period 02/26/2018. CONSTITUTIONAL: Well-developed, well-nourished female in no  acute distress.  HENT:  Normocephalic, atraumatic, External right and left ear normal. Oropharynx is clear and moist EYES: Conjunctivae and EOM are normal. Pupils are equal, round, and reactive to light. No scleral icterus.  NECK: Normal range of motion, supple, no masses SKIN: Skin is warm and dry. No rash noted. Not diaphoretic. No erythema. No pallor. NEUROLOGIC: Alert and oriented to person, place, and time. Normal reflexes, muscle tone coordination. No cranial nerve deficit noted. PSYCHIATRIC: Normal mood and affect. Normal behavior. Normal judgment and thought content. CARDIOVASCULAR: Normal heart rate noted, regular rhythm RESPIRATORY: Effort and breath sounds normal, no problems with respiration noted ABDOMEN: Soft, nontender, nondistended. PELVIC: Deferred MUSCULOSKELETAL: Normal range of motion. No edema and no tenderness. 2+ distal pulses.    Labs: Results for orders placed or performed during the hospital encounter of 03/09/18 (from the past 336 hour(s))  Wet prep, genital   Collection Time: 03/10/18 12:17 AM  Result Value Ref Range   Yeast Wet Prep HPF POC NONE SEEN NONE SEEN   Trich, Wet Prep NONE SEEN NONE SEEN   Clue Cells Wet Prep HPF POC PRESENT (A) NONE SEEN   WBC, Wet Prep HPF POC FEW (A) NONE SEEN   Sperm NONE SEEN   Chlamydia/NGC rt PCR ([facility] only)   Collection Time: 03/10/18 12:17 AM  Result Value Ref Range   Specimen source GC/Chlam ENDOCERVICAL    Chlamydia Tr NOT DETECTED NOT DETECTED   N gonorrhoeae NOT DETECTED NOT DETECTED     Imaging Studies: US Pelvis Transvanginal Non-ob (tv Only)  Result Date: 03/10/2018 CLINICAL DATA:  Pelvic pain.  History of uterine fibroids. EXAM: TRANSABDOMINAL AND TRANSVAGINAL ULTRASOUND OF PELVIS TECHNIQUE: Both transabdominal and transvaginal ultrasound examinations of the pelvis were performed. Transabdominal technique was performed for global imaging of the pelvis including uterus, ovaries, adnexal regions, and pelvic  cul-de-sac. It was necessary to proceed with endovaginal exam following the transabdominal exam to visualize the endometrium and adnexa. COMPARISON:  CT 02/14/2018 FINDINGS: Uterus Measurements: 14.8 x 11.1 x 11.9 cm. Uterus is near completely replaced by a large 10.7 x 9.1 x 11.9 cm heterogeneous fibroid. Some internal cystic spaces may represent underlying degeneration. Endometrium Not well-defined, obscured by large uterine fibroid. Right ovary Not discretely visualized.  No adnexal mass. Left ovary Not discretely visualized, no adnexal mass. Other findings No abnormal free fluid. IMPRESSION: Large uterine masses seen on recent abdominal CT, statistically most likely uterine fibroid. Internal cystic spaces may represent underlying degeneration. Fibroid  obscures the endometrium. Ovaries not visualized. Electronically Signed   By: Jeb Levering M.D.   On: 03/10/2018 01:39   US Pelvis Complete  Result Date: 03/10/2018 CLINICAL DATA:  Pelvic pain.  History of uterine fibroids. EXAM: TRANSABDOMINAL AND TRANSVAGINAL ULTRASOUND OF PELVIS TECHNIQUE: Both transabdominal and transvaginal ultrasound examinations of the pelvis were performed. Transabdominal technique was performed for global imaging of the pelvis including uterus, ovaries, adnexal regions, and pelvic cul-de-sac. It was necessary to proceed with endovaginal exam following the transabdominal exam to visualize the endometrium and adnexa. COMPARISON:  CT 02/14/2018 FINDINGS: Uterus Measurements: 14.8 x 11.1 x 11.9 cm. Uterus is near completely replaced by a large 10.7 x 9.1 x 11.9 cm heterogeneous fibroid. Some internal cystic spaces may represent underlying degeneration. Endometrium Not well-defined, obscured by large uterine fibroid. Right ovary Not discretely visualized.  No adnexal mass. Left ovary Not discretely visualized, no adnexal mass. Other findings No abnormal free fluid. IMPRESSION: Large uterine masses seen on recent abdominal CT,  statistically most likely uterine fibroid. Internal cystic spaces may represent underlying degeneration. Fibroid obscures the endometrium. Ovaries not visualized. Electronically Signed   By: Jeb Levering M.D.   On: 03/10/2018 01:39    Assessment:    Fibroid uterus (enlarged) Heavy menses Dysmenorrhea Chronic pain  Plan:    Counseling: Procedure, risks, reasons, benefits and complications (including injury to bowel, bladder, major blood vessel, ureter, bleeding, possibility of transfusion, infection, or fistula formation) reviewed in detail. Likelihood of success in alleviating the patient's condition was discussed. Routine postoperative instructions will be reviewed with the patient and her family in detail after surgery.  The patient concurred with the proposed plan, giving informed written consent for the surgery.   Preop testing ordered. Expectations for pain management reviewed.  Instructions reviewed, including NPO after midnight.  Rubie Maid, MD [facility]

## 2018-03-19 NOTE — Patient Instructions (Signed)
Abdominal Hysterectomy °Abdominal hysterectomy is a surgical procedure to remove the womb (uterus). The uterus is the muscular organ that houses a developing baby. This surgery may be done if: °· You have cancer. °· You have growths (tumors or fibroids) in the uterus. °· You have long-term (chronic) pain. °· You are bleeding. °· Your uterus has slipped down into your vagina (uterine prolapse). °· You have a condition in which the tissue that lines the uterus grows outside of its normal location (endometriosis). °· You have an infection in your uterus. °· You are having problems with your menstrual cycle. ° °Depending on why you are having this procedure, you may also have other reproductive organs removed. These could include: °· The part of your vagina that connects with your uterus (cervix). °· The organs that make eggs (ovaries). °· The tubes that connect the ovaries to the uterus (fallopian tubes). ° °Tell a health care provider about: °· Any allergies you have. °· All medicines you are taking, including vitamins, herbs, eye drops, creams, and over-the-counter medicines. °· Any problems you or family members have had with anesthetic medicines. °· Any blood disorders you have. °· Any surgeries you have had. °· Any medical conditions you have. °· Whether you are pregnant or may be pregnant. °What are the risks? °Generally, this is a safe procedure. However, problems may occur, including: °· Bleeding. °· Infection. °· Allergic reactions to medicines or dyes. °· Damage to other structures or organs. °· Nerve injury. °· Decreased interest in sex or pain during sex. °· Blood clots that can break free and travel to your lungs. ° °What happens before the procedure? °Staying hydrated °Follow instructions from your health care provider about hydration, which may include: °· Up to 2 hours before the procedure - you may continue to drink clear liquids, such as water, clear fruit juice, black coffee, and plain tea ° °Eating  and drinking restrictions °Follow instructions from your health care provider about eating and drinking, which may include: °· 8 hours before the procedure - stop eating heavy meals or foods such as meat, fried foods, or fatty foods. °· 6 hours before the procedure - stop eating light meals or foods, such as toast or cereal. °· 6 hours before the procedure - stop drinking milk or drinks that contain milk. °· 2 hours before the procedure - stop drinking clear liquids. ° °Medicines °· Ask your health care provider about: °? Changing or stopping your regular medicines. This is especially important if you are taking diabetes medicines or blood thinners. °? Taking medicines such as aspirin and ibuprofen. These medicines can thin your blood. Do not take these medicines before your procedure if your health care provider instructs you not to. °· You may be given antibiotic medicine to help prevent infection. Take it as told by your health care provider. °· You may be asked to take laxatives to prevent constipation. °General instructions °· Ask your health care provider how your surgical site will be marked or identified. °· You may be asked to shower with a germ-killing soap. °· Plan to have someone take you home from the hospital. °· Do not use any products that contain nicotine or tobacco, such as cigarettes and e-cigarettes. If you need help quitting, ask your health care provider. °· You may have an exam or testing. °· You may have a blood or urine sample taken. °· You may need to have an enema to clean out your rectum and lower colon. °· This   procedure can affect the way you feel about yourself. Talk to your health care provider about the physical and emotional changes this procedure may cause. °What happens during the procedure? °· To lower your risk of infection: °? Your health care team will wash or sanitize their hands. °? Your skin will be washed with soap. °? Hair may be removed from the surgical area. °· An IV  tube will be inserted into one of your veins. °· You will be given one or more of the following: °? A medicine to help you relax (sedative). °? A medicine to make you fall asleep (general anesthetic). °· Tight-fitting (compression) stockings will be placed on your legs to promote circulation. °· A thin, flexible tube (catheter) will be inserted to help drain your urine. °· The surgeon will make a cut (incision) through the skin in your lower belly. The incision may go side-to-side or up-and-down. °· The surgeon will move aside the body tissue that covers your uterus. The surgeon will then carefully take out your uterus along with any of the other organs that need to be removed. °· Bleeding will be controlled with clamps or sutures. °· The surgeon will close your incision with stitches (sutures), skin glue, or adhesive strips. °· A bandage (dressing) will be placed over the incision. °The procedure may vary among health care providers and hospitals. °What happens after the procedure? °· You will be given pain medicine as needed. °· Your blood pressure, heart rate, breathing rate, and blood oxygen level will be monitored until the medicines you were given have worn off. °· You will need to stay in the hospital to recover for one to two days. Ask your health care provider how long you will need to stay in the hospital after your procedure. °· You may have a liquid diet at first. You will most likely return to your usual diet the day after surgery. °· You will still have the urinary catheter in place. It will likely be removed the day after surgery. °· You may have to wear compression stockings. These stockings help to prevent blood clots and reduce swelling in your legs. °· You will be encouraged to walk as soon as possible. You will also use a device or do breathing exercises to keep your lungs clear. °· You may need to use a sanitary napkin for vaginal discharge. °Summary °· Abdominal hysterectomy is a surgical  procedure to remove the womb (uterus). The uterus is the muscular organ that houses a developing baby. °· This procedure can affect the way you feel about yourself. Talk to your health care provider about the physical and emotional changes this procedure may cause. °· You will be given medicines for pain after the procedure. °· You will need to stay in the hospital to recover. Ask your health care provider how long you will need to stay in the hospital after your procedure. °This information is not intended to replace advice given to you by your health care provider. Make sure you discuss any questions you have with your health care provider. °Document Released: 07/08/2013 Document Revised: 06/21/2016 Document Reviewed: 06/21/2016 °Elsevier Interactive Patient Education © 2017 [employer] ° °

## 2018-03-19 NOTE — H&P (Signed)
GYNECOLOGY PREOPERATIVE HISTORY AND PHYSICAL   Subjective:  Sarah Sosa is a 36 y.o. [id number] here for surgical management of enlarged fibroid uterus, heavy painful menses. No significant preoperative concerns. Of note, patient was admitted to the hospital several weeks ago and treated for PID and pelvic pain.  She is also currently being managed by pain clinic for chronic pain.   Proposed surgery: Total abdominal hysterectomy with bilateral salpingectomy   Pertinent Gynecological History: Menses: regular every month without intermenstrual spotting, with moderate to severe dysmenorrhea, usually lasting 5-6 days.  Contraception: condoms Last pap: normal Date: 2016 (Reviewed in [place])   Past Medical History:  Diagnosis Date  . Arthritis   . Asthma   . Hypertension    Past Surgical History:  Procedure Laterality Date  . CHOLECYSTECTOMY    . DENTAL SURGERY    . kenn surgery Bilateral    OB History  Gravida Para Term Preterm AB Living  3         4  SAB TAB Ectopic Multiple Live Births               # Outcome Date GA Lbr Len/2nd Weight Sex Delivery Anes PTL Lv  3 Gravida           2 Gravida           1 [place]            .  Family History  Problem Relation Age of Onset  . Fibroids Mother   . Hypertension Mother   . Arthritis Mother     Social History   Socioeconomic History  . Marital status: Significant Other    Spouse name: Not on file  . Number of children: Not on file  . Years of education: Not on file  . Highest education level: Not on file  Occupational History  . Not on file  Social Needs  . Financial resource strain: Not on file  . Food insecurity:    Worry: Not on file    Inability: Not on file  . Transportation needs:    Medical: Not on file    Non-medical: Not on file  Tobacco Use  . Smoking status: Current Every Day Smoker    Packs/day: 0.50    Types: Cigarettes  . Smokeless tobacco: Never Used  Substance and Sexual Activity    . Alcohol use: No  . Drug use: Yes    Types: Marijuana  . Sexual activity: Yes    Birth control/protection: None  Lifestyle  . Physical activity:    Days per week: Not on file    Minutes per session: Not on file  . Stress: Not on file  Relationships  . Social connections:    Talks on phone: Not on file    Gets together: Not on file    Attends religious service: Not on file    Active member of club or organization: Not on file    Attends meetings of clubs or organizations: Not on file    Relationship status: Not on file  . Intimate partner violence:    Fear of current or ex partner: Not on file    Emotionally abused: Not on file    Physically abused: Not on file    Forced sexual activity: Not on file  Other Topics Concern  . Not on file  Social History Narrative  . Not on file    Current Outpatient Medications on File Prior to Visit  Medication  Sig Dispense Refill  . ALPRAZolam (XANAX) 0.25 MG tablet Take 0.25 mg by mouth 2 (two) times daily.    . cyclobenzaprine (FLEXERIL) 10 MG tablet Take 1 tablet (10 mg total) by mouth 3 (three) times daily as needed for muscle spasms. 30 tablet 0  . oxyCODONE-acetaminophen (PERCOCET) 7.5-325 MG tablet Take 1 tablet by mouth 3 (three) times daily as needed for severe pain (pain).      No current facility-administered medications on file prior to visit.    Allergies  Allergen Reactions  . Latex   . Tomato   . Tramadol Itching     Review of Systems Constitutional: No recent fever/chills/sweats Respiratory: No recent cough/bronchitis Cardiovascular: No chest pain Gastrointestinal: No recent nausea/vomiting/diarrhea Genitourinary: No UTI symptoms Hematologic/lymphatic:No history of coagulopathy or recent blood thinner use    Objective:   Blood pressure (!) 157/104, pulse 79, height 5\' 6"  (1.676 m), weight 190 lb 1.6 oz (86.2 kg), last menstrual period 02/26/2018. CONSTITUTIONAL: Well-developed, well-nourished female in no  acute distress.  HENT:  Normocephalic, atraumatic, External right and left ear normal. Oropharynx is clear and moist EYES: Conjunctivae and EOM are normal. Pupils are equal, round, and reactive to light. No scleral icterus.  NECK: Normal range of motion, supple, no masses SKIN: Skin is warm and dry. No rash noted. Not diaphoretic. No erythema. No pallor. NEUROLOGIC: Alert and oriented to person, place, and time. Normal reflexes, muscle tone coordination. No cranial nerve deficit noted. PSYCHIATRIC: Normal mood and affect. Normal behavior. Normal judgment and thought content. CARDIOVASCULAR: Normal heart rate noted, regular rhythm RESPIRATORY: Effort and breath sounds normal, no problems with respiration noted ABDOMEN: Soft, nontender, nondistended. PELVIC: Deferred MUSCULOSKELETAL: Normal range of motion. No edema and no tenderness. 2+ distal pulses.    Labs: Results for orders placed or performed during the hospital encounter of 03/09/18 (from the past 336 hour(s))  Wet prep, genital   Collection Time: 03/10/18 12:17 AM  Result Value Ref Range   Yeast Wet Prep HPF POC NONE SEEN NONE SEEN   Trich, Wet Prep NONE SEEN NONE SEEN   Clue Cells Wet Prep HPF POC PRESENT (A) NONE SEEN   WBC, Wet Prep HPF POC FEW (A) NONE SEEN   Sperm NONE SEEN   Chlamydia/NGC rt PCR ([facility] only)   Collection Time: 03/10/18 12:17 AM  Result Value Ref Range   Specimen source GC/Chlam ENDOCERVICAL    Chlamydia Tr NOT DETECTED NOT DETECTED   N gonorrhoeae NOT DETECTED NOT DETECTED     Imaging Studies: US Pelvis Transvanginal Non-ob (tv Only)  Result Date: 03/10/2018 CLINICAL DATA:  Pelvic pain.  History of uterine fibroids. EXAM: TRANSABDOMINAL AND TRANSVAGINAL ULTRASOUND OF PELVIS TECHNIQUE: Both transabdominal and transvaginal ultrasound examinations of the pelvis were performed. Transabdominal technique was performed for global imaging of the pelvis including uterus, ovaries, adnexal regions, and pelvic  cul-de-sac. It was necessary to proceed with endovaginal exam following the transabdominal exam to visualize the endometrium and adnexa. COMPARISON:  CT 02/14/2018 FINDINGS: Uterus Measurements: 14.8 x 11.1 x 11.9 cm. Uterus is near completely replaced by a large 10.7 x 9.1 x 11.9 cm heterogeneous fibroid. Some internal cystic spaces may represent underlying degeneration. Endometrium Not well-defined, obscured by large uterine fibroid. Right ovary Not discretely visualized.  No adnexal mass. Left ovary Not discretely visualized, no adnexal mass. Other findings No abnormal free fluid. IMPRESSION: Large uterine masses seen on recent abdominal CT, statistically most likely uterine fibroid. Internal cystic spaces may represent underlying degeneration. Fibroid  obscures the endometrium. Ovaries not visualized. Electronically Signed   By: Jeb Levering M.D.   On: 03/10/2018 01:39   US Pelvis Complete  Result Date: 03/10/2018 CLINICAL DATA:  Pelvic pain.  History of uterine fibroids. EXAM: TRANSABDOMINAL AND TRANSVAGINAL ULTRASOUND OF PELVIS TECHNIQUE: Both transabdominal and transvaginal ultrasound examinations of the pelvis were performed. Transabdominal technique was performed for global imaging of the pelvis including uterus, ovaries, adnexal regions, and pelvic cul-de-sac. It was necessary to proceed with endovaginal exam following the transabdominal exam to visualize the endometrium and adnexa. COMPARISON:  CT 02/14/2018 FINDINGS: Uterus Measurements: 14.8 x 11.1 x 11.9 cm. Uterus is near completely replaced by a large 10.7 x 9.1 x 11.9 cm heterogeneous fibroid. Some internal cystic spaces may represent underlying degeneration. Endometrium Not well-defined, obscured by large uterine fibroid. Right ovary Not discretely visualized.  No adnexal mass. Left ovary Not discretely visualized, no adnexal mass. Other findings No abnormal free fluid. IMPRESSION: Large uterine masses seen on recent abdominal CT,  statistically most likely uterine fibroid. Internal cystic spaces may represent underlying degeneration. Fibroid obscures the endometrium. Ovaries not visualized. Electronically Signed   By: Jeb Levering M.D.   On: 03/10/2018 01:39    Assessment:    Fibroid uterus (enlarged) Heavy menses Dysmenorrhea Chronic pain  Plan:    Counseling: Procedure, risks, reasons, benefits and complications (including injury to bowel, bladder, major blood vessel, ureter, bleeding, possibility of transfusion, infection, or fistula formation) reviewed in detail. Likelihood of success in alleviating the patient's condition was discussed. Routine postoperative instructions will be reviewed with the patient and her family in detail after surgery.  The patient concurred with the proposed plan, giving informed written consent for the surgery.   Preop testing ordered. Expectations for pain management reviewed.  Instructions reviewed, including NPO after midnight.  Rubie Maid, MD [facility]

## 2018-03-22 LAB — CYTOLOGY - PAP
Adequacy: ABSENT
Chlamydia: NEGATIVE
Diagnosis: NEGATIVE
HPV (WINDOPATH): NOT DETECTED
NEISSERIA GONORRHEA: NEGATIVE
TRICH (WINDOWPATH): NEGATIVE

## 2018-03-27 ENCOUNTER — Inpatient Hospital Stay
Admission: RE | Admit: 2018-03-27 | Discharge: 2018-03-27 | Disposition: A | Discharge status: Home / Self Care | Payer: Self-pay | Source: Ambulatory Visit

## 2018-03-27 NOTE — Pre-Procedure Instructions (Signed)
Sarah Sosa  ECHO COMPLETE WO IMAGING ENHANCING AGENT  Order# [id number]  Reading physician: Yolonda Kida, MD Ordering physician: Rudene Re, MD Study date: 05/10/17  Study Result   Result status: Final result                   *[place], [place] [address]                            [phone]  ------------------------------------------------------------------- Transthoracic Echocardiography  Patient:    Sarah Sosa, Sarah Sosa MR #:       [id number] Study Date: 05/10/2017 Gender:     F Age:        37 Height:     167.6 cm Weight:     95.3 kg BSA:        2.14 m^2 Pt. Status: Room:       ED15A   SONOGRAPHER  [facility]  PERFORMING   [facility]  ATTENDING    [place], [place], [place], [place]  cc:  ------------------------------------------------------------------- LV EF: 60% -   65%  ------------------------------------------------------------------- Indications:      Abnormal ECG 794.31.  ------------------------------------------------------------------- History:   PMH:  Asthma  Risk factors:  Hypertension.  ------------------------------------------------------------------- Study Conclusions  - Left ventricle: The cavity size was normal. Wall thickness was   normal. Systolic function was normal. The estimated ejection   fraction was in the range of 60% to 65%. Wall motion was normal;   there were no regional wall motion abnormalities. - Aortic valve: There was moderate regurgitation.  Impressions:  - Normal LVF   Normal Wall Motion   Normal Chamber   No clear evidence of vegetation   EF=60%   Normal Right side. Normal study. No cardiac source of embolism   was identified, but cannot be ruled out on the basis of this   examination.  Recommendations:  Consider transesophageal  echocardiography if clinically indicated.  ------------------------------------------------------------------- Study data:   Study status:  Routine.  Procedure:  The patient reported no pain pre or post test. Transthoracic echocardiography. Image quality was adequate.          Transthoracic echocardiography.  M-mode, complete 2D, spectral Doppler, and color Doppler.  Birthdate:  Patient birthdate: Oct 10, 1980.  Age:  Patient is 37 yr old.  Sex:  Gender: female.    BMI: 33.9 kg/m^2.  Blood pressure:     191/91  Patient status:  Inpatient.  Study date: Study date: 05/10/2017. Study time: 03:45 PM.  -------------------------------------------------------------------  ------------------------------------------------------------------- Left ventricle:  The cavity size was normal. Wall thickness was normal. Systolic function was normal. The estimated ejection fraction was in the range of 60% to 65%. Wall motion was normal; there were no regional wall motion abnormalities.  ------------------------------------------------------------------- Aortic valve:   Trileaflet; mildly thickened leaflets. Mobility was not restricted.  Doppler:  Transvalvular velocity was within the normal range. There was no stenosis. There was moderate regurgitation.  ------------------------------------------------------------------- Aorta:  The aorta was normal, not dilated, and non-diseased. Aortic root: The aortic root was normal in size.  ------------------------------------------------------------------- Mitral valve:   Structurally  normal valve.   Leaflet separation was normal. Mobility was not restricted.  Doppler:  Transvalvular velocity was within the normal range. There was no evidence for stenosis. There was no regurgitation.  ------------------------------------------------------------------- Left atrium:  The atrium was normal in  size.  ------------------------------------------------------------------- Atrial septum:  Poorly visualized.  ------------------------------------------------------------------- Right ventricle:  The cavity size was normal. Wall thickness was normal. Systolic function was normal.  ------------------------------------------------------------------- Pulmonic valve:    Structurally normal valve.   Cusp separation was normal.  Doppler:  Transvalvular velocity was within the normal range. There was no evidence for stenosis. There was no regurgitation.  ------------------------------------------------------------------- Tricuspid valve:   Structurally normal valve.   Leaflet separation was normal.  Doppler:  Transvalvular velocity was within the normal range. There was no regurgitation.  ------------------------------------------------------------------- Pulmonary artery:   Poorly visualized. The main pulmonary artery was normal-sized. Systolic pressure was within the normal range.   ------------------------------------------------------------------- Right atrium:  The atrium was normal in size.  ------------------------------------------------------------------- Pericardium:  Not well visualized. The pericardium was normal in appearance. There was no pericardial effusion.  ------------------------------------------------------------------- Systemic veins: Inferior vena cava: The vessel was normal in size.  ------------------------------------------------------------------- Post procedure conclusions Ascending Aorta:  - The aorta was normal, not dilated, and non-diseased.  ------------------------------------------------------------------- Measurements   Left ventricle                          Value        Reference  LV ID, ED, PLAX chordal                 46.4  mm     43 - 52  LV ID, ES, PLAX chordal                 31.3  mm     23 - 38  LV fx shortening, PLAX  chordal          33    %      >=29  LV PW thickness, ED                     17.8  mm     ----------  IVS/LV PW ratio, ED                     0.88         <=1.3    Ventricular septum                      Value        Reference  IVS thickness, ED                       15.6  mm     ----------    LVOT                                    Value        Reference  LVOT ID, S                              22    mm     ----------  LVOT area  3.8   cm^2   ----------    Aorta                                   Value        Reference  Aortic root ID, ED                      29    mm     ----------    Left atrium                             Value        Reference  LA ID, A-P, ES                          37    mm     ----------  LA ID/bsa, A-P                          1.73  cm/m^2 <=2.2  LA volume, ES, 1-p A4C                  41.7  ml     ----------  LA volume/bsa, ES, 1-p A4C              19.5  ml/m^2 ----------    Right atrium                            Value        Reference  RA ID, S-I, ES, A4C             (H)     53    mm     34 - 49  RA area, ES, A4C                        15.5  cm^2   8.3 - 19.5  RA volume, ES, A/L                      37    ml     ----------  RA volume/bsa, ES, A/L                  17.3  ml/m^2 ----------    Right ventricle                         Value        Reference  RV ID, ED, PLAX                         29.8  mm     19 - 38    Pulmonic valve                          Value        Reference  Pulmonic valve peak velocity, S         68.4  cm/s   ----------  Legend: (L)  and  (H)  mark values outside specified reference range.  ------------------------------------------------------------------- Prepared and Electronically Authenticated by  Lujean Amel, MD 2018-10-25T16:22:20  [facility] Images  Show images for ECHOCARDIOGRAM COMPLETE  Patient Information   Patient Name Sarah Sosa, Sarah Sosa Sex Female DOB 05-25-1981 SSN [id number]   Reason for Exam  Priority: STAT  Comments:   Surgical History   Surgical History   No past medical history on file.    Other Surgical History   Procedure Laterality Date Comment Source  CHOLECYSTECTOMY    Provider  DENTAL SURGERY    Provider  kenn surgery Bilateral   Provider    Patient Data   Height 66 in    BP 191/91 mmHg       Performing Technologist/Nurse   Performing Technologist/Nurse: Gabriel Cirri                    Implants    No active implants to display in this view.  Order-Level Documents:   There are no order-level documents.  Encounter-Level Documents - 05/10/2017:   Document on 05/14/2017 5:45 PM by Terrilyn Saver, RN: ED PB Summary  Scan on 05/11/2017 4:32 PM by Default, Provider, MD  Scan on 05/11/2017 3:51 PM by Default, Provider, MD  Electronic signature on 05/10/2017 5:36 PM - Unsigned  Document on 05/10/2017 5:14 PM by Rudene Re, MD: ED After Visit Summary  Scan on 05/10/2017 4:48 PM by Default, Provider, MD  Electronic signature on 05/10/2017 2:37 PM - Signed      Signed   Electronically signed by Yolonda Kida, MD on 05/10/17 at 1622 EDT  Printable Result Report   Result Report   External Result Report   External Result Report

## 2018-03-28 ENCOUNTER — Encounter
Admission: RE | Admit: 2018-03-28 | Discharge: 2018-03-28 | Disposition: A | Discharge status: Home / Self Care | Payer: Medicaid Other | Source: Ambulatory Visit | Attending: Obstetrics and Gynecology | Admitting: Obstetrics and Gynecology

## 2018-03-28 ENCOUNTER — Other Ambulatory Visit: Payer: Self-pay

## 2018-03-28 HISTORY — DX: Headache, unspecified: R51.9

## 2018-03-28 HISTORY — DX: Gastro-esophageal reflux disease without esophagitis: K21.9

## 2018-03-28 HISTORY — DX: Cardiac murmur, unspecified: R01.1

## 2018-03-28 HISTORY — DX: Headache: R51

## 2018-03-28 NOTE — Patient Instructions (Signed)
  Your procedure is scheduled on: Monday April 01, 2018 Report to Same Day Surgery 2nd floor medical mall ([facility] Entrance-take elevator on left to 2nd floor.  Check in with surgery information desk.) To find out your arrival time please call [phone] between 1PM - 3PM on Friday March 29, 2018  Remember: Instructions that are not followed completely may result in serious medical risk, up to and including death, or upon the discretion of your surgeon and anesthesiologist your surgery may need to be rescheduled.    _x___ 1. Do not eat food (including mints, candies, chewing gum) after midnight the night before your procedure. You may drink clear liquids up to 2 hours before you are scheduled to arrive at the hospital for your procedure.  Do not drink clear liquids within 2 hours of your scheduled arrival to the hospital.  Clear liquids include  --Water or Apple juice without pulp  --Clear carbohydrate beverage such as Gatorade  --Black Coffee or Clear Tea (No milk, no creamers, do not add anything to the coffee or tea)    __x__ 2. No Alcohol for 24 hours before or after surgery.   __x__ 3. No Smoking or e-cigarettes for 24 prior to surgery.  Do not use any chewable tobacco products for at least 6 hour prior to surgery   __x__ 4. Notify your doctor if there is any change in your medical condition (cold, fever, infections).   __x__ 5. On the morning of surgery brush your teeth with toothpaste and water.  You may rinse your mouth with mouth wash if you wish.  Do not swallow any toothpaste or mouthwash.  Please read over the following fact sheets that you were given:   [facility] Preparing for Surgery and or MRSA Information    __x__ Use CHG Soap or sage wipes as directed on instruction sheet    Do not wear jewelry, make-up, hairpins, clips or nail polish.  Do not wear lotions, powders, deodorant, or perfumes.   Do not shave below the face/neck 48 hours prior to surgery.    Do not bring valuables to the hospital.    [facility] is not responsible for any belongings or valuables.               Contacts, dentures or bridgework may not be worn into surgery.  Leave your suitcase in the car. After surgery it may be brought to your room.  For patients admitted to the hospital, discharge time is determined by your treatment team.  _x___ Take anti-hypertensive listed below, cardiac, seizure, asthma, anti-reflux and psychiatric medicines. These include:  1. Alprazolam/Xanax  2. Oxycodone-Acetaminophen/Percocet  3. Cyclobenzaprine/Flexeril if needed  4. Acetaminophen/Tylenol if needed  5. Fluticasone/Flonase spray if needed  Do NOT take your Lisinopril-Hydrochlorothiazide/Zestoretic on the day of surgery.    _x___ Follow recommendations from Cardiologist, Pulmonologist or PCP regarding stopping Aspirin, Coumadin, Plavix ,Eliquis, Effient, or Pradaxa, and Pletal.  _x___ Stop Anti-inflammatories such as Advil, Aleve, Ibuprofen, Motrin, Naproxen, Naprosyn, Goodies powders or aspirin products. OK to take Tylenol and Celebrex.   _x___ NOW: Stop supplements until after surgery.  But may continue Vitamin D, Vitamin B, and multivitamin.

## 2018-03-29 ENCOUNTER — Inpatient Hospital Stay
Admission: RE | Admit: 2018-03-29 | Discharge: 2018-03-29 | Disposition: A | Discharge status: Home / Self Care | Payer: Self-pay | Source: Ambulatory Visit

## 2018-03-29 NOTE — Pre-Procedure Instructions (Signed)
Called Dr Cherry's office regarding the fact that patient did not show for blood work.

## 2018-03-31 MED ORDER — CEFAZOLIN SODIUM-DEXTROSE 2-4 GM/100ML-% IV SOLN: 2.0000 g | INTRAVENOUS | Status: DC

## 2018-04-01 ENCOUNTER — Ambulatory Visit
Admission: RE | Admit: 2018-04-01 | Discharge: 2018-04-01 | Disposition: A | Discharge status: Home / Self Care | Payer: Medicaid Other | Source: Ambulatory Visit | Attending: Obstetrics and Gynecology | Admitting: Obstetrics and Gynecology

## 2018-04-01 ENCOUNTER — Inpatient Hospital Stay: Payer: Medicaid Other | Admitting: Anesthesiology

## 2018-04-01 ENCOUNTER — Encounter: Payer: Self-pay | Admitting: *Deleted

## 2018-04-01 ENCOUNTER — Encounter
Admission: RE | Disposition: A | Discharge status: Home / Self Care | Payer: Self-pay | Source: Ambulatory Visit | Attending: Obstetrics and Gynecology

## 2018-04-01 ENCOUNTER — Other Ambulatory Visit: Payer: Self-pay

## 2018-04-01 DIAGNOSIS — Z01818 Encounter for other preprocedural examination: Secondary | ICD-10-CM | POA: Insufficient documentation

## 2018-04-01 LAB — ABO/RH: ABO/RH(D): A POS

## 2018-04-01 LAB — URINE DRUG SCREEN, QUALITATIVE (ARMC ONLY)
Amphetamines, Ur Screen: NOT DETECTED
BARBITURATES, UR SCREEN: NOT DETECTED
BENZODIAZEPINE, UR SCRN: NOT DETECTED
Cannabinoid 50 Ng, Ur ~~LOC~~: POSITIVE — AB
Cocaine Metabolite,Ur ~~LOC~~: POSITIVE — AB
MDMA (Ecstasy)Ur Screen: NOT DETECTED
METHADONE SCREEN, URINE: NOT DETECTED
OPIATE, UR SCREEN: NOT DETECTED
Phencyclidine (PCP) Ur S: NOT DETECTED
TRICYCLIC, UR SCREEN: POSITIVE — AB

## 2018-04-01 LAB — COMPREHENSIVE METABOLIC PANEL
ALBUMIN: 3.4 g/dL — AB (ref 3.5–5.0)
ALT: 12 U/L (ref 0–44)
ANION GAP: 8 (ref 5–15)
AST: 16 U/L (ref 15–41)
Alkaline Phosphatase: 48 U/L (ref 38–126)
BILIRUBIN TOTAL: 0.3 mg/dL (ref 0.3–1.2)
BUN: 10 mg/dL (ref 6–20)
CHLORIDE: 112 mmol/L — AB (ref 98–111)
CO2: 21 mmol/L — ABNORMAL LOW (ref 22–32)
Calcium: 8.8 mg/dL — ABNORMAL LOW (ref 8.9–10.3)
Creatinine, Ser: 0.84 mg/dL (ref 0.44–1.00)
GFR calc Af Amer: >60 mL/min (ref >60)
GFR calc non Af Amer: >60 mL/min (ref >60)
GLUCOSE: 96 mg/dL (ref 70–99)
POTASSIUM: 3.9 mmol/L (ref 3.5–5.1)
Sodium: 141 mmol/L (ref 135–145)
TOTAL PROTEIN: 7.2 g/dL (ref 6.5–8.1)

## 2018-04-01 LAB — CBC
HEMATOCRIT: 39.2 % (ref 35.0–47.0)
Hemoglobin: 13.6 g/dL (ref 12.0–16.0)
MCH: 31.3 pg (ref 26.0–34.0)
MCHC: 34.5 g/dL (ref 32.0–36.0)
MCV: 90.6 fL (ref 80.0–100.0)
PLATELETS: 280 10*3/uL (ref 150–440)
RBC: 4.33 MIL/uL (ref 3.80–5.20)
RDW: 15.7 % — AB (ref 11.5–14.5)
WBC: 3 10*3/uL — ABNORMAL LOW (ref 3.6–11.0)

## 2018-04-01 LAB — RAPID HIV SCREEN (HIV 1/2 AB+AG)
HIV 1/2 Antibodies: NONREACTIVE
HIV-1 P24 ANTIGEN - HIV24: NONREACTIVE

## 2018-04-01 LAB — POCT PREGNANCY, URINE: PREG TEST UR: NEGATIVE

## 2018-04-01 SURGERY — HYSTERECTOMY, TOTAL, ABDOMINAL, WITH SALPINGECTOMY
Anesthesia: General | Laterality: Bilateral

## 2018-04-01 MED ORDER — FAMOTIDINE 20 MG PO TABS: 20.0000 mg | ORAL_TABLET | Freq: Once | ORAL | Status: DC

## 2018-04-01 MED ORDER — LACTATED RINGERS IV SOLN: INTRAVENOUS | Status: DC

## 2018-04-01 SURGICAL SUPPLY — 29 items
CANISTER SUCT 1200ML W/VALVE (MISCELLANEOUS) ×3 IMPLANT
CHLORAPREP W/TINT 26ML (MISCELLANEOUS) ×3 IMPLANT
DRAPE LAPAROTOMY 100X77 ABD (DRAPES) ×3 IMPLANT
DRAPE LAPAROTOMY TRNSV 106X77 (MISCELLANEOUS) ×3 IMPLANT
DRSG TELFA 3X8 NADH (GAUZE/BANDAGES/DRESSINGS) ×3 IMPLANT
ELECT BLADE 6 FLAT ULTRCLN (ELECTRODE) ×3 IMPLANT
ELECT REM PT RETURN 9FT ADLT (ELECTROSURGICAL) ×3
ELECTRODE REM PT RTRN 9FT ADLT (ELECTROSURGICAL) ×1 IMPLANT
GAUZE SPONGE 4X4 12PLY STRL (GAUZE/BANDAGES/DRESSINGS) ×3 IMPLANT
GLOVE BIO SURGEON STRL SZ 6.5 (GLOVE) ×2 IMPLANT
GLOVE BIO SURGEONS STRL SZ 6.5 (GLOVE) ×1
GLOVE INDICATOR 7.0 STRL GRN (GLOVE) ×3 IMPLANT
GOWN STRL REUS W/ TWL LRG LVL3 (GOWN DISPOSABLE) ×2 IMPLANT
GOWN STRL REUS W/TWL LRG LVL3 (GOWN DISPOSABLE) ×4
KIT TURNOVER CYSTO (KITS) ×3 IMPLANT
LABEL OR SOLS (LABEL) ×3 IMPLANT
NS IRRIG 1000ML POUR BTL (IV SOLUTION) ×3 IMPLANT
PACK BASIN MAJOR ARMC (MISCELLANEOUS) ×3 IMPLANT
SPONGE LAP 18X18 RF (DISPOSABLE) ×3 IMPLANT
STAPLER SKIN PROX 35W (STAPLE) ×3 IMPLANT
SUT MNCRL 4-0 (SUTURE) ×2
SUT MNCRL 4-0 27XMFL (SUTURE) ×1
SUT VIC AB 0 CT1 27 (SUTURE) ×2
SUT VIC AB 0 CT1 27XCR 8 STRN (SUTURE) ×1 IMPLANT
SUT VIC AB 0 CT1 36 (SUTURE) ×6 IMPLANT
SUTURE MNCRL 4-0 27XMF (SUTURE) ×1 IMPLANT
SYR BULB IRRIG 60ML STRL (SYRINGE) ×3 IMPLANT
TRAY FOLEY MTR SLVR 16FR STAT (SET/KITS/TRAYS/PACK) ×3 IMPLANT
TRAY PREP VAG/GEN (MISCELLANEOUS) ×3 IMPLANT

## 2018-04-01 NOTE — Progress Notes (Signed)
Dr. Amie Critchley in to speak to patient regarding positive drug scrreen;  Patient told to call office and reschedule.

## 2018-04-02 LAB — RPR: RPR Ser Ql: NONREACTIVE

## 2018-04-07 MED ORDER — BUPIVACAINE HCL (PF) 0.5 % IJ SOLN
INTRAMUSCULAR | Status: AC
  Filled 2018-04-07: qty 30

## 2018-04-08 ENCOUNTER — Encounter: Payer: Self-pay | Admitting: Emergency Medicine

## 2018-04-08 ENCOUNTER — Emergency Department
Admission: EM | Admit: 2018-04-08 | Discharge: 2018-04-08 | Disposition: A | Discharge status: Home / Self Care | Payer: Medicaid Other | Attending: Emergency Medicine | Admitting: Emergency Medicine

## 2018-04-08 DIAGNOSIS — G8929 Other chronic pain: Secondary | ICD-10-CM

## 2018-04-08 DIAGNOSIS — J45909 Unspecified asthma, uncomplicated: Secondary | ICD-10-CM | POA: Diagnosis not present

## 2018-04-08 DIAGNOSIS — M545 Low back pain: Secondary | ICD-10-CM | POA: Diagnosis not present

## 2018-04-08 DIAGNOSIS — Z79899 Other long term (current) drug therapy: Secondary | ICD-10-CM | POA: Diagnosis not present

## 2018-04-08 DIAGNOSIS — F17210 Nicotine dependence, cigarettes, uncomplicated: Secondary | ICD-10-CM | POA: Diagnosis not present

## 2018-04-08 DIAGNOSIS — R300 Dysuria: Secondary | ICD-10-CM | POA: Insufficient documentation

## 2018-04-08 DIAGNOSIS — I10 Essential (primary) hypertension: Secondary | ICD-10-CM | POA: Diagnosis not present

## 2018-04-08 LAB — CBC
HCT: 38.8 % (ref 35.0–47.0)
Hemoglobin: 13.4 g/dL (ref 12.0–16.0)
MCH: 31.2 pg (ref 26.0–34.0)
MCHC: 34.4 g/dL (ref 32.0–36.0)
MCV: 90.8 fL (ref 80.0–100.0)
PLATELETS: 433 10*3/uL (ref 150–440)
RBC: 4.28 MIL/uL (ref 3.80–5.20)
RDW: 15.1 % — AB (ref 11.5–14.5)
WBC: 7.8 10*3/uL (ref 3.6–11.0)

## 2018-04-08 LAB — COMPREHENSIVE METABOLIC PANEL
ALK PHOS: 54 U/L (ref 38–126)
ALT: 11 U/L (ref 0–44)
AST: 20 U/L (ref 15–41)
Albumin: 3.5 g/dL (ref 3.5–5.0)
Anion gap: 9 (ref 5–15)
BUN: 9 mg/dL (ref 6–20)
CHLORIDE: 105 mmol/L (ref 98–111)
CO2: 24 mmol/L (ref 22–32)
CREATININE: 0.92 mg/dL (ref 0.44–1.00)
Calcium: 9.1 mg/dL (ref 8.9–10.3)
GFR calc non Af Amer: >60 mL/min (ref >60)
GLUCOSE: 145 mg/dL — AB (ref 70–99)
Potassium: 3.5 mmol/L (ref 3.5–5.1)
Sodium: 138 mmol/L (ref 135–145)
Total Bilirubin: 0.3 mg/dL (ref 0.3–1.2)
Total Protein: 7.5 g/dL (ref 6.5–8.1)

## 2018-04-08 LAB — URINALYSIS, COMPLETE (UACMP) WITH MICROSCOPIC
BACTERIA UA: NONE SEEN
BILIRUBIN URINE: NEGATIVE
Bilirubin Urine: NEGATIVE
GLUCOSE, UA: NEGATIVE mg/dL
Glucose, UA: NEGATIVE mg/dL
Hgb urine dipstick: NEGATIVE
KETONES UR: NEGATIVE mg/dL
KETONES UR: NEGATIVE mg/dL
LEUKOCYTES UA: NEGATIVE
Leukocytes, UA: NEGATIVE
NITRITE: NEGATIVE
Nitrite: NEGATIVE
PH: 5 (ref 5.0–8.0)
Protein, ur: 30 mg/dL — AB
Protein, ur: NEGATIVE mg/dL
SPECIFIC GRAVITY, URINE: 1.025 (ref 1.005–1.030)
SPECIFIC GRAVITY, URINE: 1.012 (ref 1.005–1.030)
pH: 6 (ref 5.0–8.0)

## 2018-04-08 LAB — PREGNANCY, URINE: Preg Test, Ur: NEGATIVE

## 2018-04-08 MED ORDER — CYCLOBENZAPRINE HCL 10 MG PO TABS: 10.0000 mg | ORAL_TABLET | Freq: Three times a day (TID) | ORAL | 0 refills | Status: DC | PRN

## 2018-04-08 MED ORDER — CEPHALEXIN 500 MG PO CAPS: 500.0000 mg | ORAL_CAPSULE | Freq: Two times a day (BID) | ORAL | 0 refills | Status: DC

## 2018-04-08 MED ORDER — MORPHINE SULFATE (PF) 4 MG/ML IV SOLN
4.0000 mg | Freq: Once | INTRAVENOUS | Status: AC
  Administered 2018-04-08: 4 mg via INTRAVENOUS
  Filled 2018-04-08: qty 1

## 2018-04-08 NOTE — ED Triage Notes (Signed)
Patient states that she is having lower back pain that started yesterday. Patient states that she has had similar pain in the past and was admitting with a kidney infection. Patient states that she has taken 7.5 mg percocet with no relief.

## 2018-04-08 NOTE — ED Notes (Signed)
Pt ambulatory with slow steady gait to stat registration to ask for a blanket; explained to pt that there was one more person to be seen ahead of her; pt replied "I've been suffering for years anyway so it don't make a difference"

## 2018-04-08 NOTE — ED Notes (Signed)
Pt awake and alert in waiting room, waiting patiently for treatment room

## 2018-04-08 NOTE — ED Provider Notes (Signed)
[facility] Emergency Department Provider Note    First MD Initiated Contact with Patient 04/08/18 0559     (approximate)  I have reviewed the triage vital signs and the nursing notes.   HISTORY  Chief Complaint Back Pain    HPI Sarah Sosa is a 37 y.o. female low list of chronic medical conditions including chronic pain disorder secondary to chronic back pain presents to the emergency department with low back pain which patient states began yesterday which was nontraumatic in nature.  Patient states that she had a similar pain in the past when she had a kidney infection.  Patient denies any fever, febrile on presentation.  Patient does however admit to dysuria x2 days.  Patient states that she took a Percocet 7.5 mg tablet before arrival without any improvement of pain.  Patient denies any lower examinee weakness numbness gait instability.  Patient denies any change in bowel or urinary habits.  Past Medical History:  Diagnosis Date  . Arthritis   . Asthma   . Chronic pain disorder 03/19/2018  . GERD (gastroesophageal reflux disease)   . Headache   . Heart murmur   . History of trichomoniasis 03/19/2018  . Hypertension   . PID (pelvic inflammatory disease) 02/14/2018  . Uterine leiomyoma 03/19/2018    Patient Active Problem List   Diagnosis Date Noted  . Uterine leiomyoma 03/19/2018  . Menorrhagia with regular cycle 03/19/2018  . Chronic pain disorder 03/19/2018  . Pelvic pain 03/19/2018  . Essential hypertension 03/19/2018    Past Surgical History:  Procedure Laterality Date  . CHOLECYSTECTOMY    . DENTAL SURGERY    . kenn surgery Bilateral   . KNEE ARTHROSCOPY Left 2012, 2013    Prior to Admission medications   Medication Sig Start Date End Date Taking? Authorizing Provider  acetaminophen (TYLENOL) 500 MG tablet Take 1,000-1,500 mg by mouth 2 (two) times daily as needed for moderate pain.    [provider]  ALPRAZolam Duanne Moron) 0.25  MG tablet Take 0.25 mg by mouth 2 (two) times daily.    [provider]  cephALEXin (KEFLEX) 500 MG capsule Take 1 capsule (500 mg total) by mouth 2 (two) times daily for 10 days. 04/08/18 04/18/18  Gregor Hams, MD  cyclobenzaprine (FLEXERIL) 10 MG tablet Take 1 tablet (10 mg total) by mouth 3 (three) times daily as needed for muscle spasms. 05/04/17   Darel Hong, MD  cyclobenzaprine (FLEXERIL) 10 MG tablet Take 1 tablet (10 mg total) by mouth 3 (three) times daily as needed. 04/08/18   Gregor Hams, MD  diphenhydramine-acetaminophen (TYLENOL PM) 25-500 MG TABS tablet Take 2 tablets by mouth at bedtime as needed (sleep).    [provider]  fluticasone (FLONASE) 50 MCG/ACT nasal spray Place 1-2 sprays into both nostrils daily as needed for allergies or rhinitis.    [provider]  lisinopril-hydrochlorothiazide (ZESTORETIC) 10-12.5 MG tablet Take 1 tablet by mouth daily. 03/19/18   Rubie Maid, MD  oxyCODONE-acetaminophen (PERCOCET) 7.5-325 MG tablet Take 1 tablet by mouth 4 (four) times daily.     [provider]    Allergies Latex; Nsaids; Tomato; and Tramadol  Family History  Problem Relation Age of Onset  . Fibroids Mother   . Hypertension Mother   . Arthritis Mother     Social History Social History   Tobacco Use  . Smoking status: Current Every Day Smoker    Packs/day: 0.50    Years: 22.00  Pack years: 11.00    Types: Cigarettes  . Smokeless tobacco: Never Used  Substance Use Topics  . Alcohol use: Yes    Comment: occasional  . Drug use: Yes    Types: Marijuana    Review of Systems Constitutional: No fever/chills Eyes: No visual changes. ENT: No sore throat. Cardiovascular: Denies chest pain. Respiratory: Denies shortness of breath. Gastrointestinal: No abdominal pain.  No nausea, no vomiting.  No diarrhea.  No constipation. Genitourinary: Positive for dysuria Musculoskeletal: Negative for neck pain.  Positive for  back pain. Integumentary: Negative for rash. Neurological: Negative for headaches, focal weakness or numbness.   ____________________________________________   PHYSICAL EXAM:  VITAL SIGNS: ED Triage Vitals [04/08/18 0208]  Enc Vitals Group     BP (!) 174/112     Pulse Rate 97     Resp 18     Temp 98.6 F (37 C)     Temp Source Oral     SpO2 100 %     Weight 93.4 kg (206 lb)     Height 1.702 m (5\' 7" )     Head Circumference      Peak Flow      Pain Score 10     Pain Loc      Pain Edu?      Excl. in [place]?     Constitutional: Alert and oriented. Well appearing and in no acute distress. Eyes: Conjunctivae are normal.  Head: Atraumatic. Mouth/Throat: Mucous membranes are moist.  Oropharynx non-erythematous. Neck: No stridor.  No meningeal signs.  No cervical spine tenderness to palpation. Cardiovascular: Normal rate, regular rhythm. Good peripheral circulation. Grossly normal heart sounds. Respiratory: Normal respiratory effort.  No retractions. Lungs CTAB. Gastrointestinal: Soft and nontender. No distention.  Musculoskeletal: Diffuse back pain with very gentle palpation Neurologic:  Normal speech and language. No gross focal neurologic deficits are appreciated.  Skin:  Skin is warm, dry and intact. No rash noted. Psychiatric: Mood and affect are normal. Speech and behavior are normal.  ____________________________________________   LABS (all labs ordered are listed, but only abnormal results are displayed)  Labs Reviewed  COMPREHENSIVE METABOLIC PANEL - Abnormal; Notable for the following components:      Result Value   Glucose, Bld 145 (*)    All other components within normal limits  CBC - Abnormal; Notable for the following components:   RDW 15.1 (*)    All other components within normal limits  URINALYSIS, COMPLETE (UACMP) WITH MICROSCOPIC - Abnormal; Notable for the following components:   Color, Urine YELLOW (*)    APPearance CLOUDY (*)    Hgb urine dipstick  SMALL (*)    Protein, ur 30 (*)    Bacteria, UA RARE (*)    All other components within normal limits  URINALYSIS, COMPLETE (UACMP) WITH MICROSCOPIC - Abnormal; Notable for the following components:   Color, Urine STRAW (*)    APPearance CLEAR (*)    All other components within normal limits  PREGNANCY, URINE   ____    Procedures   ____________________________________________   INITIAL IMPRESSION / ASSESSMENT AND PLAN / ED COURSE  As part of my medical decision making, I reviewed the following data within the electronic MEDICAL RECORD NUMBER   37 year old female presenting with above-stated history and physical exam secondary to back pain.  Considered possibility of pyelonephritis.  Patient initially urinalysis was a unclean catch and as such repeat sample was obtained which revealed no evidence of UTI.  Patient is also afebrile.  Considered possibility of epidural abscess or cauda equina however no saddle anesthesia or any neurological deficits.  Patient is also afebrile with unremarkable laboratory data.  I suspect the patient's pain is secondary to her known history of chronic back pain.  Patient was given IV morphine in the emergency department with pain improvement.  Patient is advised to follow-up with her chronic pain physician.  Patient was prescribed Flexeril ____________________________________________  FINAL CLINICAL IMPRESSION(S) / ED DIAGNOSES  Final diagnoses:  Dysuria  Chronic bilateral low back pain without sciatica     MEDICATIONS GIVEN DURING THIS VISIT:  Medications  morphine 4 MG/ML injection 4 mg (4 mg Intravenous Given 04/08/18 0537)     ED Discharge Orders         Ordered    cephALEXin (KEFLEX) 500 MG capsule  2 times daily,   Status:  Discontinued     04/08/18 0610    cyclobenzaprine (FLEXERIL) 10 MG tablet  3 times daily PRN,   Status:  Discontinued     04/08/18 0610    cyclobenzaprine (FLEXERIL) 10 MG tablet  3 times daily PRN     04/08/18 0638     cephALEXin (KEFLEX) 500 MG capsule  2 times daily     04/08/18 4403           Note:  This document was prepared using Dragon voice recognition software and may include unintentional dictation errors.    Gregor Hams, MD 04/08/18 2245

## 2018-04-12 ENCOUNTER — Other Ambulatory Visit: Payer: Self-pay | Admitting: Obstetrics and Gynecology

## 2018-04-14 MED ORDER — CEFAZOLIN SODIUM-DEXTROSE 2-4 GM/100ML-% IV SOLN
2.0000 g | INTRAVENOUS | Status: AC
  Administered 2018-04-15: 2 g via INTRAVENOUS

## 2018-04-15 ENCOUNTER — Inpatient Hospital Stay: Payer: Medicaid Other | Admitting: Anesthesiology

## 2018-04-15 ENCOUNTER — Encounter
Admission: RE | Disposition: A | Discharge status: Home / Self Care | Payer: Self-pay | Source: Home / Self Care | Attending: Obstetrics and Gynecology

## 2018-04-15 ENCOUNTER — Inpatient Hospital Stay
Admission: RE | Admit: 2018-04-15 | Discharge: 2018-04-18 | DRG: 743 | Disposition: A | Discharge status: Home / Self Care | Payer: Medicaid Other | Attending: Obstetrics and Gynecology | Admitting: Obstetrics and Gynecology

## 2018-04-15 ENCOUNTER — Encounter: Payer: Self-pay | Admitting: Anesthesiology

## 2018-04-15 ENCOUNTER — Other Ambulatory Visit: Payer: Self-pay

## 2018-04-15 DIAGNOSIS — I10 Essential (primary) hypertension: Secondary | ICD-10-CM | POA: Diagnosis present

## 2018-04-15 DIAGNOSIS — R102 Pelvic and perineal pain: Secondary | ICD-10-CM | POA: Diagnosis present

## 2018-04-15 DIAGNOSIS — N946 Dysmenorrhea, unspecified: Secondary | ICD-10-CM

## 2018-04-15 DIAGNOSIS — N920 Excessive and frequent menstruation with regular cycle: Secondary | ICD-10-CM | POA: Diagnosis present

## 2018-04-15 DIAGNOSIS — Z90710 Acquired absence of both cervix and uterus: Secondary | ICD-10-CM

## 2018-04-15 DIAGNOSIS — F17210 Nicotine dependence, cigarettes, uncomplicated: Secondary | ICD-10-CM | POA: Diagnosis present

## 2018-04-15 DIAGNOSIS — G8929 Other chronic pain: Secondary | ICD-10-CM

## 2018-04-15 DIAGNOSIS — N941 Unspecified dyspareunia: Secondary | ICD-10-CM | POA: Diagnosis present

## 2018-04-15 DIAGNOSIS — D259 Leiomyoma of uterus, unspecified: Secondary | ICD-10-CM | POA: Diagnosis present

## 2018-04-15 DIAGNOSIS — N852 Hypertrophy of uterus: Secondary | ICD-10-CM

## 2018-04-15 DIAGNOSIS — F419 Anxiety disorder, unspecified: Secondary | ICD-10-CM | POA: Diagnosis present

## 2018-04-15 DIAGNOSIS — D251 Intramural leiomyoma of uterus: Secondary | ICD-10-CM

## 2018-04-15 DIAGNOSIS — Z9889 Other specified postprocedural states: Secondary | ICD-10-CM

## 2018-04-15 HISTORY — PX: HYSTERECTOMY ABDOMINAL WITH SALPINGECTOMY: SHX6725

## 2018-04-15 LAB — POCT PREGNANCY, URINE: PREG TEST UR: NEGATIVE

## 2018-04-15 LAB — TYPE AND SCREEN
ABO/RH(D): A POS
ANTIBODY SCREEN: NEGATIVE

## 2018-04-15 LAB — URINE DRUG SCREEN, QUALITATIVE (ARMC ONLY)
Amphetamines, Ur Screen: NOT DETECTED
Barbiturates, Ur Screen: NOT DETECTED
Benzodiazepine, Ur Scrn: NOT DETECTED
CANNABINOID 50 NG, UR ~~LOC~~: POSITIVE — AB
COCAINE METABOLITE, UR ~~LOC~~: NOT DETECTED
MDMA (ECSTASY) UR SCREEN: NOT DETECTED
Methadone Scn, Ur: NOT DETECTED
Opiate, Ur Screen: NOT DETECTED
PHENCYCLIDINE (PCP) UR S: NOT DETECTED
Tricyclic, Ur Screen: NOT DETECTED

## 2018-04-15 SURGERY — HYSTERECTOMY, TOTAL, ABDOMINAL, WITH SALPINGECTOMY
Anesthesia: General | Laterality: Bilateral

## 2018-04-15 MED ORDER — CEFAZOLIN SODIUM-DEXTROSE 2-4 GM/100ML-% IV SOLN
INTRAVENOUS | Status: AC
  Filled 2018-04-15: qty 100

## 2018-04-15 MED ORDER — FENTANYL CITRATE (PF) 250 MCG/5ML IJ SOLN
INTRAMUSCULAR | Status: AC
  Filled 2018-04-15: qty 5

## 2018-04-15 MED ORDER — LIDOCAINE HCL (PF) 2 % IJ SOLN
INTRAMUSCULAR | Status: AC
  Filled 2018-04-15: qty 10

## 2018-04-15 MED ORDER — FENTANYL CITRATE (PF) 100 MCG/2ML IJ SOLN
INTRAMUSCULAR | Status: AC
  Administered 2018-04-15: 25 ug via INTRAVENOUS
  Filled 2018-04-15: qty 2

## 2018-04-15 MED ORDER — GABAPENTIN 300 MG PO CAPS
ORAL_CAPSULE | ORAL | Status: AC
  Administered 2018-04-15: 600 mg via ORAL
  Filled 2018-04-15: qty 2

## 2018-04-15 MED ORDER — FENTANYL CITRATE (PF) 100 MCG/2ML IJ SOLN
INTRAMUSCULAR | Status: DC | PRN
  Administered 2018-04-15 (×5): 50 ug via INTRAVENOUS

## 2018-04-15 MED ORDER — ONDANSETRON HCL 4 MG/2ML IJ SOLN: 4.0000 mg | Freq: Once | INTRAMUSCULAR | Status: DC | PRN

## 2018-04-15 MED ORDER — KETAMINE HCL 50 MG/ML IJ SOLN
INTRAMUSCULAR | Status: DC | PRN
  Administered 2018-04-15: 25 mg via INTRAMUSCULAR
  Administered 2018-04-15: 50 mg via INTRAMUSCULAR

## 2018-04-15 MED ORDER — DOCUSATE SODIUM 100 MG PO CAPS
100.0000 mg | ORAL_CAPSULE | Freq: Two times a day (BID) | ORAL | Status: DC
  Administered 2018-04-15 – 2018-04-18 (×7): 100 mg via ORAL
  Filled 2018-04-15 (×7): qty 1

## 2018-04-15 MED ORDER — MIDAZOLAM HCL 2 MG/2ML IJ SOLN
INTRAMUSCULAR | Status: AC
  Filled 2018-04-15: qty 2

## 2018-04-15 MED ORDER — ACETAMINOPHEN 325 MG PO TABS
ORAL_TABLET | ORAL | Status: AC
  Administered 2018-04-15: 1000 mg via ORAL
  Filled 2018-04-15: qty 3

## 2018-04-15 MED ORDER — PROPOFOL 10 MG/ML IV BOLUS
INTRAVENOUS | Status: AC
  Filled 2018-04-15: qty 20

## 2018-04-15 MED ORDER — KETOROLAC TROMETHAMINE 30 MG/ML IJ SOLN
INTRAMUSCULAR | Status: AC
  Filled 2018-04-15: qty 1

## 2018-04-15 MED ORDER — MAGNESIUM CITRATE PO SOLN
1.0000 | Freq: Once | ORAL | Status: AC | PRN
  Administered 2018-04-18: 1 via ORAL
  Filled 2018-04-15 (×2): qty 296

## 2018-04-15 MED ORDER — BUPIVACAINE HCL (PF) 0.5 % IJ SOLN
INTRAMUSCULAR | Status: AC
  Filled 2018-04-15: qty 30

## 2018-04-15 MED ORDER — LACTATED RINGERS IV SOLN
INTRAVENOUS | Status: DC
  Administered 2018-04-15 – 2018-04-18 (×7): via INTRAVENOUS

## 2018-04-15 MED ORDER — ACETAMINOPHEN NICU IV SYRINGE 10 MG/ML
INTRAVENOUS | Status: AC
  Filled 2018-04-15: qty 1

## 2018-04-15 MED ORDER — GABAPENTIN 300 MG PO CAPS
600.0000 mg | ORAL_CAPSULE | Freq: Once | ORAL | Status: AC
  Administered 2018-04-15: 600 mg via ORAL

## 2018-04-15 MED ORDER — ONDANSETRON HCL 4 MG PO TABS: 4.0000 mg | ORAL_TABLET | Freq: Four times a day (QID) | ORAL | Status: DC | PRN

## 2018-04-15 MED ORDER — PHENYLEPHRINE HCL 10 MG/ML IJ SOLN
INTRAMUSCULAR | Status: DC | PRN
  Administered 2018-04-15 (×7): 100 ug via INTRAVENOUS

## 2018-04-15 MED ORDER — BUPIVACAINE 0.25 % ON-Q PUMP DUAL CATH 400 ML
400.0000 mL | INJECTION | Status: DC
  Filled 2018-04-15: qty 400

## 2018-04-15 MED ORDER — SUGAMMADEX SODIUM 200 MG/2ML IV SOLN
INTRAVENOUS | Status: AC
  Filled 2018-04-15: qty 2

## 2018-04-15 MED ORDER — ROCURONIUM BROMIDE 50 MG/5ML IV SOLN
INTRAVENOUS | Status: AC
  Filled 2018-04-15: qty 1

## 2018-04-15 MED ORDER — LIDOCAINE HCL (CARDIAC) PF 100 MG/5ML IV SOSY
PREFILLED_SYRINGE | INTRAVENOUS | Status: DC | PRN
  Administered 2018-04-15: 100 mg via INTRAVENOUS

## 2018-04-15 MED ORDER — HYDROMORPHONE HCL 1 MG/ML IJ SOLN
INTRAMUSCULAR | Status: DC | PRN
  Administered 2018-04-15 (×2): 0.5 mg via INTRAVENOUS

## 2018-04-15 MED ORDER — BUPIVACAINE HCL 0.5 % IJ SOLN
INTRAMUSCULAR | Status: DC | PRN
  Administered 2018-04-15: 10 mL

## 2018-04-15 MED ORDER — HYDROCHLOROTHIAZIDE 12.5 MG PO CAPS
12.5000 mg | ORAL_CAPSULE | Freq: Every day | ORAL | Status: DC
  Administered 2018-04-16 – 2018-04-18 (×3): 12.5 mg via ORAL
  Filled 2018-04-15 (×4): qty 1

## 2018-04-15 MED ORDER — ONDANSETRON HCL 4 MG/2ML IJ SOLN
INTRAMUSCULAR | Status: AC
  Filled 2018-04-15: qty 2

## 2018-04-15 MED ORDER — FAMOTIDINE 20 MG PO TABS
20.0000 mg | ORAL_TABLET | Freq: Once | ORAL | Status: AC
  Administered 2018-04-15: 20 mg via ORAL

## 2018-04-15 MED ORDER — ACETAMINOPHEN 10 MG/ML IV SOLN
INTRAVENOUS | Status: DC | PRN
  Administered 2018-04-15: 1000 mg via INTRAVENOUS

## 2018-04-15 MED ORDER — ONDANSETRON HCL 4 MG/2ML IJ SOLN
4.0000 mg | Freq: Four times a day (QID) | INTRAMUSCULAR | Status: DC | PRN
  Administered 2018-04-18 (×2): 4 mg via INTRAVENOUS
  Filled 2018-04-15 (×2): qty 2

## 2018-04-15 MED ORDER — PANTOPRAZOLE SODIUM 40 MG PO TBEC
40.0000 mg | DELAYED_RELEASE_TABLET | Freq: Every day | ORAL | Status: DC
  Administered 2018-04-15 – 2018-04-18 (×4): 40 mg via ORAL
  Filled 2018-04-15 (×4): qty 1

## 2018-04-15 MED ORDER — ALUM & MAG HYDROXIDE-SIMETH 200-200-20 MG/5ML PO SUSP
30.0000 mL | ORAL | Status: DC | PRN
  Filled 2018-04-15: qty 30

## 2018-04-15 MED ORDER — ONDANSETRON HCL 4 MG/2ML IJ SOLN
INTRAMUSCULAR | Status: DC | PRN
  Administered 2018-04-15: 4 mg via INTRAVENOUS

## 2018-04-15 MED ORDER — PROPOFOL 10 MG/ML IV BOLUS
INTRAVENOUS | Status: DC | PRN
  Administered 2018-04-15: 200 mg via INTRAVENOUS

## 2018-04-15 MED ORDER — SIMETHICONE 80 MG PO CHEW
80.0000 mg | CHEWABLE_TABLET | Freq: Four times a day (QID) | ORAL | Status: DC | PRN
  Administered 2018-04-15 – 2018-04-17 (×5): 80 mg via ORAL
  Filled 2018-04-15 (×6): qty 1

## 2018-04-15 MED ORDER — GLYCOPYRROLATE 0.2 MG/ML IJ SOLN
INTRAMUSCULAR | Status: DC | PRN
  Administered 2018-04-15: .15 mg via INTRAVENOUS

## 2018-04-15 MED ORDER — LISINOPRIL-HYDROCHLOROTHIAZIDE 10-12.5 MG PO TABS: 1.0000 | ORAL_TABLET | Freq: Every day | ORAL | Status: DC

## 2018-04-15 MED ORDER — ROCURONIUM BROMIDE 100 MG/10ML IV SOLN
INTRAVENOUS | Status: DC | PRN
  Administered 2018-04-15: 50 mg via INTRAVENOUS
  Administered 2018-04-15: 20 mg via INTRAVENOUS

## 2018-04-15 MED ORDER — OXYCODONE HCL 5 MG PO TABS
5.0000 mg | ORAL_TABLET | Freq: Four times a day (QID) | ORAL | Status: DC
  Administered 2018-04-15: 5 mg via ORAL
  Filled 2018-04-15: qty 1

## 2018-04-15 MED ORDER — KETAMINE HCL 50 MG/ML IJ SOLN
INTRAMUSCULAR | Status: AC
  Filled 2018-04-15: qty 10

## 2018-04-15 MED ORDER — LIDOCAINE 5 % EX PTCH
MEDICATED_PATCH | CUTANEOUS | Status: AC
  Filled 2018-04-15: qty 1

## 2018-04-15 MED ORDER — NICOTINE 21 MG/24HR TD PT24: 21.0000 mg | MEDICATED_PATCH | Freq: Every day | TRANSDERMAL | Status: DC

## 2018-04-15 MED ORDER — OXYCODONE HCL 5 MG PO TABS: 10.0000 mg | ORAL_TABLET | Freq: Four times a day (QID) | ORAL | Status: DC | PRN

## 2018-04-15 MED ORDER — MIDAZOLAM HCL 2 MG/2ML IJ SOLN
INTRAMUSCULAR | Status: DC | PRN
  Administered 2018-04-15: 2 mg via INTRAVENOUS

## 2018-04-15 MED ORDER — LACTATED RINGERS IV SOLN
INTRAVENOUS | Status: DC
  Administered 2018-04-15: 09:00:00 via INTRAVENOUS

## 2018-04-15 MED ORDER — KETOROLAC TROMETHAMINE 30 MG/ML IJ SOLN
30.0000 mg | Freq: Four times a day (QID) | INTRAMUSCULAR | Status: DC
  Administered 2018-04-15 – 2018-04-17 (×7): 30 mg via INTRAVENOUS
  Filled 2018-04-15 (×7): qty 1

## 2018-04-15 MED ORDER — DEXAMETHASONE SODIUM PHOSPHATE 10 MG/ML IJ SOLN
INTRAMUSCULAR | Status: DC | PRN
  Administered 2018-04-15: 10 mg via INTRAVENOUS

## 2018-04-15 MED ORDER — ACETAMINOPHEN 325 MG PO TABS: 650.0000 mg | ORAL_TABLET | ORAL | Status: DC | PRN

## 2018-04-15 MED ORDER — FAMOTIDINE 20 MG PO TABS
ORAL_TABLET | ORAL | Status: AC
  Administered 2018-04-15: 20 mg via ORAL
  Filled 2018-04-15: qty 1

## 2018-04-15 MED ORDER — BISACODYL 10 MG RE SUPP
10.0000 mg | Freq: Every day | RECTAL | Status: DC | PRN
  Administered 2018-04-18: 10 mg via RECTAL
  Filled 2018-04-15 (×2): qty 1

## 2018-04-15 MED ORDER — OXYCODONE HCL 5 MG PO TABS
5.0000 mg | ORAL_TABLET | Freq: Once | ORAL | Status: AC
  Administered 2018-04-15: 5 mg via ORAL
  Filled 2018-04-15: qty 1

## 2018-04-15 MED ORDER — OXYCODONE-ACETAMINOPHEN 7.5-325 MG PO TABS
1.0000 | ORAL_TABLET | Freq: Four times a day (QID) | ORAL | Status: DC
  Administered 2018-04-15 – 2018-04-16 (×2): 1 via ORAL
  Filled 2018-04-15 (×2): qty 1

## 2018-04-15 MED ORDER — GLYCOPYRROLATE 0.2 MG/ML IJ SOLN
INTRAMUSCULAR | Status: AC
  Filled 2018-04-15: qty 1

## 2018-04-15 MED ORDER — ACETAMINOPHEN 500 MG PO TABS
1000.0000 mg | ORAL_TABLET | Freq: Once | ORAL | Status: AC
  Administered 2018-04-15: 1000 mg via ORAL

## 2018-04-15 MED ORDER — DEXAMETHASONE SODIUM PHOSPHATE 10 MG/ML IJ SOLN
INTRAMUSCULAR | Status: AC
  Filled 2018-04-15: qty 1

## 2018-04-15 MED ORDER — ACETAMINOPHEN 500 MG PO TABS
1000.0000 mg | ORAL_TABLET | Freq: Four times a day (QID) | ORAL | Status: DC
  Administered 2018-04-15: 1000 mg via ORAL
  Filled 2018-04-15: qty 2

## 2018-04-15 MED ORDER — ZOLPIDEM TARTRATE 5 MG PO TABS: 5.0000 mg | ORAL_TABLET | Freq: Every evening | ORAL | Status: DC | PRN

## 2018-04-15 MED ORDER — FENTANYL CITRATE (PF) 100 MCG/2ML IJ SOLN
25.0000 ug | INTRAMUSCULAR | Status: AC | PRN
  Administered 2018-04-15 (×6): 25 ug via INTRAVENOUS

## 2018-04-15 MED ORDER — OXYCODONE-ACETAMINOPHEN 5-325 MG PO TABS: 1.0000 | ORAL_TABLET | ORAL | Status: DC | PRN

## 2018-04-15 MED ORDER — HYDROMORPHONE HCL 1 MG/ML IJ SOLN
0.2000 mg | INTRAMUSCULAR | Status: DC | PRN
  Administered 2018-04-15 (×2): 0.6 mg via INTRAVENOUS
  Administered 2018-04-15: 0.4 mg via INTRAVENOUS
  Administered 2018-04-15 – 2018-04-17 (×15): 0.6 mg via INTRAVENOUS
  Filled 2018-04-15 (×18): qty 1

## 2018-04-15 MED ORDER — CYCLOBENZAPRINE HCL 10 MG PO TABS
10.0000 mg | ORAL_TABLET | Freq: Three times a day (TID) | ORAL | Status: DC | PRN
  Administered 2018-04-16 – 2018-04-18 (×3): 10 mg via ORAL
  Filled 2018-04-15 (×4): qty 1

## 2018-04-15 MED ORDER — MENTHOL 3 MG MT LOZG
1.0000 | LOZENGE | OROMUCOSAL | Status: DC | PRN
  Filled 2018-04-15: qty 9

## 2018-04-15 MED ORDER — HYDROMORPHONE HCL 1 MG/ML IJ SOLN
INTRAMUSCULAR | Status: AC
  Filled 2018-04-15: qty 1

## 2018-04-15 MED ORDER — LISINOPRIL 10 MG PO TABS
10.0000 mg | ORAL_TABLET | Freq: Every day | ORAL | Status: DC
  Administered 2018-04-16 – 2018-04-18 (×3): 10 mg via ORAL
  Filled 2018-04-15 (×4): qty 1

## 2018-04-15 MED ORDER — SENNOSIDES-DOCUSATE SODIUM 8.6-50 MG PO TABS: 1.0000 | ORAL_TABLET | Freq: Every evening | ORAL | Status: DC | PRN

## 2018-04-15 MED ORDER — ACETAMINOPHEN 500 MG PO TABS
ORAL_TABLET | ORAL | Status: AC
  Filled 2018-04-15: qty 2

## 2018-04-15 MED ORDER — SUGAMMADEX SODIUM 200 MG/2ML IV SOLN
INTRAVENOUS | Status: DC | PRN
  Administered 2018-04-15: 186.8 mg via INTRAVENOUS

## 2018-04-15 MED ORDER — LACTATED RINGERS IV SOLN: INTRAVENOUS | Status: DC

## 2018-04-15 SURGICAL SUPPLY — 35 items
CANISTER SUCT 1200ML W/VALVE (MISCELLANEOUS) ×3 IMPLANT
CATH KIT ON-Q SILVERSOAK 5IN (CATHETERS) ×6 IMPLANT
CHLORAPREP W/TINT 26ML (MISCELLANEOUS) ×3 IMPLANT
DERMABOND ADVANCED (GAUZE/BANDAGES/DRESSINGS) ×2
DERMABOND ADVANCED .7 DNX12 (GAUZE/BANDAGES/DRESSINGS) ×1 IMPLANT
DRAPE LAPAROTOMY 100X77 ABD (DRAPES) ×3 IMPLANT
DRAPE LAPAROTOMY TRNSV 106X77 (MISCELLANEOUS) ×3 IMPLANT
DRAPE POUCH INSTRU U-SHP 10X18 (DRAPES) ×3 IMPLANT
DRSG TELFA 3X8 NADH (GAUZE/BANDAGES/DRESSINGS) ×3 IMPLANT
ELECT BLADE 6 FLAT ULTRCLN (ELECTRODE) ×3 IMPLANT
ELECT REM PT RETURN 9FT ADLT (ELECTROSURGICAL) ×3
ELECTRODE REM PT RTRN 9FT ADLT (ELECTROSURGICAL) ×1 IMPLANT
GAUZE SPONGE 4X4 12PLY STRL (GAUZE/BANDAGES/DRESSINGS) ×3 IMPLANT
GLOVE BIO SURGEON STRL SZ 6.5 (GLOVE) ×2 IMPLANT
GLOVE BIO SURGEONS STRL SZ 6.5 (GLOVE) ×1
GLOVE INDICATOR 7.0 STRL GRN (GLOVE) ×3 IMPLANT
GOWN STRL REUS W/ TWL LRG LVL3 (GOWN DISPOSABLE) ×2 IMPLANT
GOWN STRL REUS W/TWL LRG LVL3 (GOWN DISPOSABLE) ×4
KIT TURNOVER CYSTO (KITS) ×3 IMPLANT
LABEL OR SOLS (LABEL) ×3 IMPLANT
NS IRRIG 1000ML POUR BTL (IV SOLUTION) ×3 IMPLANT
PACK BASIN MAJOR ARMC (MISCELLANEOUS) ×3 IMPLANT
RETRACTOR WND ALEXIS-O 25 LRG (MISCELLANEOUS) ×1 IMPLANT
RTRCTR WOUND ALEXIS O 25CM LRG (MISCELLANEOUS) ×3
SPONGE LAP 18X18 RF (DISPOSABLE) ×3 IMPLANT
STAPLER SKIN PROX 35W (STAPLE) ×3 IMPLANT
SUT MNCRL 4-0 (SUTURE) ×2
SUT MNCRL 4-0 27XMFL (SUTURE) ×1
SUT VIC AB 0 CT1 27 (SUTURE) ×2
SUT VIC AB 0 CT1 27XCR 8 STRN (SUTURE) ×1 IMPLANT
SUT VIC AB 0 CT1 36 (SUTURE) ×6 IMPLANT
SUTURE MNCRL 4-0 27XMF (SUTURE) ×1 IMPLANT
SYR BULB IRRIG 60ML STRL (SYRINGE) ×3 IMPLANT
TRAY FOLEY MTR SLVR 16FR STAT (SET/KITS/TRAYS/PACK) ×3 IMPLANT
TRAY PREP VAG/GEN (MISCELLANEOUS) ×3 IMPLANT

## 2018-04-15 NOTE — Anesthesia Procedure Notes (Signed)
Procedure Name: Intubation Performed by: Scarleth Brame, CRNA Pre-anesthesia Checklist: Patient identified, Patient being monitored, Timeout performed, Emergency Drugs available and Suction available Patient Re-evaluated:Patient Re-evaluated prior to induction Oxygen Delivery Method: Circle system utilized Preoxygenation: Pre-oxygenation with 100% oxygen Induction Type: IV induction Ventilation: Mask ventilation without difficulty Laryngoscope Size: Mac and 3 Grade View: Grade II Tube type: Oral Tube size: 7.0 mm Number of attempts: 1 Airway Equipment and Method: Stylet Placement Confirmation: ETT inserted through vocal cords under direct vision,  positive ETCO2 and breath sounds checked- equal and bilateral Secured at: 21 cm Tube secured with: Tape Dental Injury: Teeth and Oropharynx as per pre-operative assessment        

## 2018-04-15 NOTE — Op Note (Signed)
Procedure(s): HYSTERECTOMY ABDOMINAL WITH BILATERAL SALPINGECTOMY Procedure Note  Sarah Sosa female 37 y.o. 04/15/2018  Indications: The patient is a 37 y.o. G81P0 female with enlarged fibroid uterus, chronic pain, menorrhagia, dysmenorrhea, h/o substance abuse  Pre-operative Diagnosis: enlarged fibroid uterus, chronic pain, menorrhagia, dysmenorrhea  Post-operative Diagnosis: Same  Surgeon: Rubie Maid, MD  Assistants:  Malachi Paradise, MD. No other capable assistant available in surgery requiring a high level assistant.   Anesthesia: General endotracheal anesthesia  Procedure Details: The patient was seen in the Holding Room. The risks, benefits, complications, treatment options, and expected outcomes were discussed with the patient.  The patient concurred with the proposed plan, giving informed consent.  The site of surgery properly noted/marked. The patient was taken to the Operating Room, identified as Sarah Sosa and the procedure verified as Procedure(s) (LRB): HYSTERECTOMY ABDOMINAL WITH BILATERAL SALPINGECTOMY (Bilateral). A Time Out was held and the above information confirmed.  After induction of anesthesia, the patient was draped and prepped in the usual sterile manner. She was placed in supine position after anesthesia and draped and prepped in the usual sterile manner. Foley catheter was placed.  A Pfannenstiel incision was made and carried through the subcutaneous tissue to the fascia. Fascial incision was made and extended laterally. The rectus muscles were separated. The peritoneum was identified and entered. Peritoneal incision was extended longitudinally.  The above findings were noted. The bowel was packed away from the surgical site.   The round ligaments were identified, clamped, ligated, and cut using the Ligasure device. The anterior peritoneal reflection was incised and the bladder was dissected off the lower uterine segment. The retroperitoneal space was  explored and the ureters were identified bilaterally. The right utero-ovarian ligament and proximal fallopian tube were grasped, cut, and suture ligated with 0-Vicryl. The left utero-ovarian ligament and proximal fallopian tube were grasped, cut and suture ligated with 0-Vicryl. The fallopian tubes were then bilaterally grasped with Babcock clamps, clamped, ligated and cut in the mesosalpinx, and removed from the uterus. Hemostasis was observed. The uterine vessels were skeletonized, then clamped, ligated, and cut using the Ligasure device.. Serial pedicles of the cardinal and utero-sacral ligaments were clamped, ligated, and cut using the Ligasure device. Entrance was made into the vagina and the uterus removed. Vaginal cuff angle sutures were placed incorporating the utero-sacral ligaments for support. The vaginal cuff was then closed with a figure-of-eight stitch of 0-Vicryl. Lavage was carried out until clear. Hemostasis was observed.  All packing was removed from the abdomen. The On-Q pump device was then placed according to manufacturer instructions. The fascia was approximated with running sutures of 0-Vicryl. Lavage was again carried out. Hemostasis was observed. The skin was approximated with 4-0 Vicryl.  Instrument, sponge, and needle counts were correct prior to abdominal closure and at the conclusion of the case.   Estimated Blood Loss:  125 ml      Drains: straight catheterization prior to procedure with  100 ml of clear urine         Total IV Fluids:  800 ml  Specimens: Uterus with cervix, bilateral fallopian tubes         Implants: On-Q bupivacaine pump         Complications:  None; patient tolerated the procedure well.         Disposition: PACU - hemodynamically stable.         Condition: stable   Rubie Maid, MD [facility]

## 2018-04-15 NOTE — Interval H&P Note (Signed)
History and Physical Interval Note:  04/15/2018 9:25 AM  Sarah Sosa  has presented today for surgery, with the diagnosis of UTERINE LEIOMYOMA, MENORRHAGIA, CHRONIC PAIN DISORDER  The various methods of treatment have been discussed with the patient and family. After consideration of risks, benefits and other options for treatment, the patient has consented to  Procedure(s): HYSTERECTOMY ABDOMINAL WITH BILATERAL SALPINGECTOMY (Bilateral) as a surgical intervention .  The patient's history has been reviewed, patient examined, no change in status, stable for surgery.  I have reviewed the patient's chart and labs.  Questions were answered to the patient's satisfaction.     Rubie Maid, MD [facility]

## 2018-04-15 NOTE — Anesthesia Postprocedure Evaluation (Signed)
Anesthesia Post Note  Patient: Sarah Sosa  Procedure(s) Performed: HYSTERECTOMY ABDOMINAL WITH BILATERAL SALPINGECTOMY (Bilateral )  Patient location during evaluation: PACU Anesthesia Type: General Level of consciousness: awake and alert and oriented Pain management: pain level controlled Vital Signs Assessment: post-procedure vital signs reviewed and stable Respiratory status: spontaneous breathing Cardiovascular status: blood pressure returned to baseline Anesthetic complications: no     Last Vitals:  Vitals:   04/15/18 1300 04/15/18 1350  BP: (!) 133/102 (!) 145/89  Pulse: 71 69  Resp: 16 18  Temp: 37.1 C 36.9 C  SpO2: 93% 96%    Last Pain:  Vitals:   04/15/18 1402  TempSrc:   PainSc: 8                  Sarah Sosa

## 2018-04-15 NOTE — Anesthesia Preprocedure Evaluation (Signed)
Anesthesia Evaluation  Patient identified by MRN, date of birth, ID band Patient awake    Reviewed: Allergy & Precautions, NPO status , Patient's Chart, lab work & pertinent test results  Airway Mallampati: II  TM Distance: >3 FB     Dental   Pulmonary asthma , Current Smoker,    Pulmonary exam normal        Cardiovascular hypertension, Pt. on medications Normal cardiovascular exam+ Valvular Problems/Murmurs      Neuro/Psych  Headaches, negative psych ROS   GI/Hepatic Neg liver ROS, GERD  ,  Endo/Other  negative endocrine ROS  Renal/GU negative Renal ROS  Female GU complaint     Musculoskeletal  (+) Arthritis , Osteoarthritis,    Abdominal Normal abdominal exam  (+)   Peds negative pediatric ROS (+)  Hematology   Anesthesia Other Findings Past Medical History: No date: Arthritis No date: Asthma 03/19/2018: Chronic pain disorder No date: GERD (gastroesophageal reflux disease) No date: Headache No date: Heart murmur 03/19/2018: History of trichomoniasis No date: Hypertension 02/14/2018: PID (pelvic inflammatory disease) 03/19/2018: Uterine leiomyoma  Reproductive/Obstetrics                             Anesthesia Physical Anesthesia Plan  ASA: II  Anesthesia Plan: General   Post-op Pain Management:    Induction: Intravenous  PONV Risk Score and Plan:   Airway Management Planned: Oral ETT  Additional Equipment:   Intra-op Plan:   Post-operative Plan: Extubation in OR  Informed Consent: I have reviewed the patients History and Physical, chart, labs and discussed the procedure including the risks, benefits and alternatives for the proposed anesthesia with the patient or authorized representative who has indicated his/her understanding and acceptance.   Dental advisory given  Plan Discussed with: CRNA and Surgeon  Anesthesia Plan Comments:         Anesthesia Quick  Evaluation

## 2018-04-15 NOTE — Anesthesia Post-op Follow-up Note (Signed)
Anesthesia QCDR form completed.        

## 2018-04-15 NOTE — Transfer of Care (Signed)
Immediate Anesthesia Transfer of Care Note  Patient: Sarah Sosa  Procedure(s) Performed: HYSTERECTOMY ABDOMINAL WITH BILATERAL SALPINGECTOMY (Bilateral )  Patient Location: PACU  Anesthesia Type:General  Level of Consciousness: sedated  Airway & Oxygen Therapy: Patient Spontanous Breathing and Patient connected to face mask oxygen  Post-op Assessment: Report given to RN and Post -op Vital signs reviewed and stable  Post vital signs: Reviewed and stable  Last Vitals:  Vitals Value Taken Time  BP 100/60 04/15/2018 11:38 AM  Temp    Pulse 78 04/15/2018 11:40 AM  Resp 27 04/15/2018 11:40 AM  SpO2 99 % 04/15/2018 11:40 AM  Vitals shown include unvalidated device data.  Last Pain:  Vitals:   04/15/18 0820  TempSrc: Temporal  PainSc: 9          Complications: No apparent anesthesia complications

## 2018-04-16 LAB — CBC
HEMATOCRIT: 36.9 % (ref 35.0–47.0)
HEMOGLOBIN: 12.4 g/dL (ref 12.0–16.0)
MCH: 30.9 pg (ref 26.0–34.0)
MCHC: 33.7 g/dL (ref 32.0–36.0)
MCV: 91.8 fL (ref 80.0–100.0)
Platelets: 284 10*3/uL (ref 150–440)
RBC: 4.02 MIL/uL (ref 3.80–5.20)
RDW: 15.4 % — ABNORMAL HIGH (ref 11.5–14.5)
WBC: 15.7 10*3/uL — ABNORMAL HIGH (ref 3.6–11.0)

## 2018-04-16 LAB — SURGICAL PATHOLOGY

## 2018-04-16 LAB — CREATININE, SERUM
Creatinine, Ser: 0.98 mg/dL (ref 0.44–1.00)
GFR calc Af Amer: >60 mL/min (ref >60)
GFR calc non Af Amer: >60 mL/min (ref >60)

## 2018-04-16 MED ORDER — OXYCODONE-ACETAMINOPHEN 7.5-325 MG PO TABS: 1.0000 | ORAL_TABLET | ORAL | Status: DC | PRN

## 2018-04-16 MED ORDER — OXYCODONE-ACETAMINOPHEN 7.5-325 MG PO TABS
2.0000 | ORAL_TABLET | ORAL | Status: DC | PRN
  Administered 2018-04-16 – 2018-04-17 (×8): 2 via ORAL
  Filled 2018-04-16 (×9): qty 2

## 2018-04-16 MED ORDER — ALPRAZOLAM 0.25 MG PO TABS
0.2500 mg | ORAL_TABLET | Freq: Two times a day (BID) | ORAL | Status: DC
  Administered 2018-04-17 – 2018-04-18 (×3): 0.25 mg via ORAL
  Filled 2018-04-16 (×4): qty 1

## 2018-04-16 NOTE — Progress Notes (Signed)
Patient vitals stable this morning. Blood pressure elevated at 149/91. Scheduled bp meds given. Patient complaining of pain 10/10. Pain being controlled with PO percocet and IV dilaudid and toradol. Patient also complaining of "a lot of gas bubbles." PRN simethicone given and RN encouraged patient to ambulate. Patient has voided x1 (359ml) since foley catheter removal at 0630. Patient tolerating PO food/fluids. Incision WDL. No vaginal bleeding. RN to continue to monitor.

## 2018-04-16 NOTE — Progress Notes (Signed)
Post-Operative Day # 1, s/p TAH with bilateral salpingectomy. Performed for menorrhagia, enalrged fibroid uterus, pelvic pain, dysmenorrhea. PMH of chronic pain, substance abuse.   Subjective: Patient complains of pain, currently unrelieved by pain medications, and notes that she is feeling very anxious and was not able to sleep much last night. Tolerating diet. Has not yet ambulated. Is belching but otherwise  Not passing flatus, no BM.  Denies SOB, chest pain, nausea/vomiting.   Objective: Temp:  [97.5 F (36.4 C)-99.2 F (37.3 C)] 98.4 F (36.9 C) (10/01 0747) Pulse Rate:  [61-87] 64 (10/01 0747) Resp:  [12-32] 20 (10/01 0747) BP: (93-175)/(57-104) 149/91 (10/01 0747) SpO2:  [91 %-100 %] 97 % (10/01 0747) Weight:  [93.4 kg] 93.4 kg (09/30 0820)  Physical Exam:  General: alert and no distress  Lungs: clear to auscultation bilaterally Heart: regular rate and rhythm, S1, S2 normal, no murmur, click, rub or gallop Abdomen: soft, non-tender; bowel sounds normal; no masses,  no organomegaly Pelvis:Bleeding: appropriate, scant,  Incision: healing well, no significant drainage, no dehiscence, no significant erythema Extremities: DVT Evaluation: No evidence of DVT seen on physical exam. Negative Homan's sign. No cords or calf tenderness. No significant calf/ankle edema.   Labs:  CBC Latest Ref Rng & Units 04/16/2018 04/08/2018  WBC 3.6 - 11.0 K/uL 15.7(H) 7.8  Hemoglobin 12.0 - 16.0 g/dL 12.4 13.4  Hematocrit 35.0 - 47.0 % 36.9 38.8  Platelets 150 - 440 K/uL 284 433     Lab Results  Component Value Date   CREATININE 0.98 04/16/2018   CREATININE 0.92 04/08/2018     Assessment: s/p Procedure(s): HYSTERECTOMY ABDOMINAL WITH BILATERAL SALPINGECTOMY (Bilateral): stable, tolerating diet and post-operative pain uncontrolled.   H/o chronic pain H/o substance abuse Anxiety HTN   Plan: Advance diet.  Encourage ambulation Continue PO medication, patient notes that the dosage she is  on currently is not her regular dosage prescribed by her pain doctor in [place]. Usually uses 1-2 tabs of 7.5 mg Percocet q 4 hrs (not 1 tab q 6 as currently documented in her chart). Will change. Also has been using IV Dilaudid frequently, in addition to her Toradol IV. Advised that after changing of her pain medications, she should begin to notice a difference and not requires so much IV pain medication.   Patient states she uses Xanax for her anxiety. Is on her MAR, can receive.   Discontinue IV fluids once tolerating regular diet.  Resume home BP meds (although patient notes she did not start them as encouraged after her last admission. Dispo: home in 1-2 days.    LOS: 1 day       Rubie Maid, MD [facility]

## 2018-04-16 NOTE — Plan of Care (Signed)
Patient's temp max 99.2 orally; other vital signs stable except for one high BP that RN was not notified of by Theodis Sato NT, therefore the MD was not notified, subsequent BP's were normal; pain controlled with po percocet, IV toradol, and IV dilaudid; patient TCDB with splinting; incentive spirometer used every hour while awake; IV fluids continue; IV site clear; husband at bedside and attentive; foley catheter to be removed this am; urine clear amber and qs.

## 2018-04-17 MED ORDER — ACETAMINOPHEN 325 MG PO TABS
650.0000 mg | ORAL_TABLET | ORAL | Status: DC
  Administered 2018-04-17 – 2018-04-18 (×6): 650 mg via ORAL
  Filled 2018-04-17 (×6): qty 2

## 2018-04-17 MED ORDER — OXYCODONE HCL 5 MG PO TABS
20.0000 mg | ORAL_TABLET | ORAL | Status: DC
  Administered 2018-04-17 – 2018-04-18 (×6): 20 mg via ORAL
  Filled 2018-04-17 (×6): qty 4

## 2018-04-17 MED ORDER — OXYCODONE-ACETAMINOPHEN 7.5-325 MG PO TABS: 2.0000 | ORAL_TABLET | ORAL | Status: DC

## 2018-04-17 MED ORDER — NALOXONE HCL 0.4 MG/ML IJ SOLN: 0.4000 mg | INTRAMUSCULAR | Status: DC | PRN

## 2018-04-17 MED ORDER — LIDOCAINE 5 % EX PTCH
1.0000 | MEDICATED_PATCH | CUTANEOUS | Status: DC
  Administered 2018-04-17 – 2018-04-18 (×2): 1 via TRANSDERMAL
  Filled 2018-04-17 (×2): qty 1

## 2018-04-17 MED ORDER — KETOROLAC TROMETHAMINE 30 MG/ML IJ SOLN
30.0000 mg | Freq: Four times a day (QID) | INTRAMUSCULAR | Status: DC
  Administered 2018-04-17 – 2018-04-18 (×5): 30 mg via INTRAMUSCULAR
  Filled 2018-04-17 (×5): qty 1

## 2018-04-17 MED ORDER — MAGNESIUM HYDROXIDE 400 MG/5ML PO SUSP
30.0000 mL | Freq: Every day | ORAL | Status: DC | PRN
  Administered 2018-04-17: 30 mL via ORAL
  Filled 2018-04-17: qty 30

## 2018-04-17 NOTE — Progress Notes (Signed)
Post-Operative Day # 2, s/p TAH with bilateral salpingectomy. Performed for menorrhagia, enalrged fibroid uterus, pelvic pain, dysmenorrhea. PMH of chronic pain, substance abuse.   Subjective: Patient ambulating voiding, has passed gas.  Tolerating diet. Still continues to complain of 10/10 pain at times. Has passed flatus. Is currently using Toradol, IV Dilaudid, and Hydrocodone scheduled.   Objective: Vitals:   04/16/18 2035 04/16/18 2344 04/17/18 0329 04/17/18 0428  BP: (!) 146/83 (!) 142/77 (!) 146/100 126/74  Pulse: 65 70 66 66  Resp: 20 20    Temp: 97.9 F (36.6 C) 98.1 F (36.7 C) 98 F (36.7 C)   TempSrc: Oral Oral Oral   SpO2: 100% 98% 99%   Weight:      Height:        Physical Exam:  General: alert and no distress  Lungs: clear to auscultation bilaterally Heart: regular rate and rhythm, S1, S2 normal, no murmur, click, rub or gallop Abdomen: soft, non-tender; bowel sounds normal; no masses,  no organomegaly Pelvis:Bleeding: appropriate, scant,  Incision: healing well, no significant drainage, no dehiscence, no significant erythema Extremities: DVT Evaluation: No evidence of DVT seen on physical exam. Negative Homan's sign. No cords or calf tenderness. No significant calf/ankle edema.   Labs:  CBC Latest Ref Rng & Units 04/16/2018 04/08/2018  WBC 3.6 - 11.0 K/uL 15.7(H) 7.8  Hemoglobin 12.0 - 16.0 g/dL 12.4 13.4  Hematocrit 35.0 - 47.0 % 36.9 38.8  Platelets 150 - 440 K/uL 284 433     Lab Results  Component Value Date   CREATININE 0.98 04/16/2018   CREATININE 0.92 04/08/2018     Assessment: s/p Procedure(s): HYSTERECTOMY ABDOMINAL WITH BILATERAL SALPINGECTOMY (Bilateral): stable, tolerating diet and post-operative pain uncontrolled.   H/o chronic pain H/o substance abuse Anxiety HTN   Plan: Regular diet as tolerated.  Encourage ambulation Continue PO pain medication scheduled, will discontinue IV narcotic pain medications, but can change Toradol to IM.    Continue Xanax for her anxiety.  Continue home BP meds Dispo: home in 1-2 days.    LOS: 2 days       Rubie Maid, MD [facility]

## 2018-04-18 MED ORDER — OXYCODONE HCL 5 MG PO TABS: 5.0000 mg | ORAL_TABLET | ORAL | 0 refills | Status: AC

## 2018-04-18 MED ORDER — DOCUSATE SODIUM 100 MG PO CAPS: 100.0000 mg | ORAL_CAPSULE | Freq: Two times a day (BID) | ORAL | 2 refills | Status: DC | PRN

## 2018-04-18 MED ORDER — KETOROLAC TROMETHAMINE 10 MG PO TABS: 10.0000 mg | ORAL_TABLET | Freq: Four times a day (QID) | ORAL | 0 refills | Status: DC | PRN

## 2018-04-18 MED ORDER — GABAPENTIN 300 MG PO CAPS: 300.0000 mg | ORAL_CAPSULE | Freq: Three times a day (TID) | ORAL | 0 refills | Status: AC

## 2018-04-18 MED ORDER — OXYCODONE HCL 5 MG PO TABS: 5.0000 mg | ORAL_TABLET | ORAL | 0 refills | Status: DC | PRN

## 2018-04-18 NOTE — Discharge Summary (Signed)
Gynecology Physician Postoperative Discharge Summary  Patient ID: Sarah Sosa MRN: [id number] DOB/AGE: 36-05-1981 36 y.o.  Admit Date: 04/15/2018 Discharge Date: 04/18/2018  Preoperative Diagnoses: Enlarged fibroid uterus, pelvic pain, dysmenorrhea, dyspareunia. Also with h/o chronic pain, history of substance abuse, anxiety.   Procedures: Procedure(s) (LRB): HYSTERECTOMY ABDOMINAL WITH BILATERAL SALPINGECTOMY (Bilateral)  Hospital Course:  Sarah Sosa is a 37 y.o. G3P0  admitted for scheduled surgery.  She underwent the procedures as mentioned above, her operation was uncomplicated. For further details about surgery, please refer to the operative report. Patient had a postoperative course complicated by post-operative pain management. By time of discharge on POD#3, her pain was controlled on oral pain medications; she was ambulating, voiding without difficulty, tolerating regular diet and passing flatus. She was deemed stable for discharge to home.   Significant Labs: CBC Latest Ref Rng & Units 04/16/2018 04/08/2018 04/01/2018  WBC 3.6 - 11.0 K/uL 15.7(H) 7.8 3.0(L)  Hemoglobin 12.0 - 16.0 g/dL 12.4 13.4 13.6  Hematocrit 35.0 - 47.0 % 36.9 38.8 39.2  Platelets 150 - 440 K/uL 284 433 280    Lab Results  Component Value Date   CREATININE 0.98 04/16/2018   CREATININE 0.92 04/08/2018     Discharge Exam: Blood pressure 131/82, pulse 69, temperature 98.6 F (37 C), temperature source Oral, resp. rate 18, height 5\' 7"  (1.702 m), weight 93.4 kg, last menstrual period 03/25/2018, SpO2 99 %. General appearance: alert and no distress  Resp: clear to auscultation bilaterally  Cardio: regular rate and rhythm  GI: soft, non-tender; bowel sounds normal; no masses, no organomegaly.  Incision: C/D/I, no erythema, no drainage noted Pelvic: scant blood on pad  Extremities: extremities normal, atraumatic, no cyanosis or edema and Homans sign is negative, no sign of DVT  Discharged Condition:  Stable  Disposition: Discharge disposition: 01-Home or Self Care       Discharge Instructions    Discharge patient   Complete by:  As directed    Discharge disposition:  01-Home or Self Care   Discharge patient date:  04/18/2018     Allergies as of 04/18/2018      Reactions   Latex Anaphylaxis, Hives, Itching   Nsaids Other (See Comments)   Serve ulcers; stomach aches, GERD.    Tomato Anaphylaxis, Hives, Itching   Tramadol Itching      Medication List    STOP taking these medications   cephALEXin 500 MG capsule Commonly known as:  KEFLEX     TAKE these medications   acetaminophen 500 MG tablet Commonly known as:  TYLENOL Take 1,000-1,500 mg by mouth 2 (two) times daily as needed for moderate pain.   ALPRAZolam 0.25 MG tablet Commonly known as:  XANAX Take 0.25 mg by mouth 2 (two) times daily.   cyclobenzaprine 10 MG tablet Commonly known as:  FLEXERIL Take 1 tablet (10 mg total) by mouth 3 (three) times daily as needed for muscle spasms. What changed:  Another medication with the same name was removed. Continue taking this medication, and follow the directions you see here.   diphenhydramine-acetaminophen 25-500 MG Tabs tablet Commonly known as:  TYLENOL PM Take 2 tablets by mouth at bedtime as needed (sleep).   docusate sodium 100 MG capsule Commonly known as:  COLACE Take 1 capsule (100 mg total) by mouth 2 (two) times daily as needed.   fluticasone 50 MCG/ACT nasal spray Commonly known as:  FLONASE Place 1-2 sprays into both nostrils daily as needed for allergies or rhinitis.   gabapentin  300 MG capsule Commonly known as:  NEURONTIN Take 1 capsule (300 mg total) by mouth 3 (three) times daily for 7 days. For nerve pain after surgery   ketorolac 10 MG tablet Commonly known as:  TORADOL Take 1 tablet (10 mg total) by mouth every 6 (six) hours as needed for moderate pain or severe pain.   lisinopril-hydrochlorothiazide 10-12.5 MG tablet Commonly known  as:  PRINZIDE,ZESTORETIC Take 1 tablet by mouth daily.   oxyCODONE 5 MG immediate release tablet Commonly known as:  Oxy IR/ROXICODONE Take 1 tablet (5 mg total) by mouth every 4 (four) hours as needed for up to 5 days for breakthrough pain.   oxyCODONE-acetaminophen 7.5-325 MG tablet Commonly known as:  PERCOCET Take 1 tablet by mouth 4 (four) times daily.      No future appointments. Follow-up Information    Rubie Maid, MD In 1 week.   Specialties:  Obstetrics and Gynecology, Radiology Why:  For wound re-check Contact information: [place] [address] [phone]           Signed:  Rubie Maid, MD [facility]

## 2018-04-18 NOTE — Progress Notes (Signed)
Post-Operative Day # 3, s/p TAH with bilateral salpingectomy. Performed for menorrhagia, enalrged fibroid uterus, pelvic pain, dysmenorrhea. PMH of chronic pain, substance abuse.   Subjective: Patient ambulating voiding, has passed gas and had BM.  Tolerating diet. Still continues to complain of 10/10 pain at times. Has passed flatus. Had to increase patient's pain medication which she notes significantly helped, in addition to taking her Flexeril.  Objective: Vitals:   04/17/18 1906 04/18/18 0008 04/18/18 0418 04/18/18 0827  BP: (!) 143/70 (!) 195/97 (!) 149/89 131/82  Pulse: 83 87 78 69  Resp: 20 18 20 18   Temp: 98.5 F (36.9 C) 98.4 F (36.9 C) 98.9 F (37.2 C) 98.6 F (37 C)  TempSrc: Oral Oral Oral Oral  SpO2: 99% 100% 100% 99%  Weight:      Height:        Physical Exam:  General: alert and no distress  Lungs: clear to auscultation bilaterally Heart: regular rate and rhythm, S1, S2 normal, no murmur, click, rub or gallop Abdomen: soft, non-tender; bowel sounds normal; no masses,  no organomegaly Pelvis:Bleeding: none  Incision: healing well, no significant drainage, no dehiscence, no significant erythema Extremities: DVT Evaluation: No evidence of DVT seen on physical exam. Negative Homan's sign. No cords or calf tenderness. No significant calf/ankle edema.   Labs:  CBC Latest Ref Rng & Units 04/16/2018 04/08/2018  WBC 3.6 - 11.0 K/uL 15.7(H) 7.8  Hemoglobin 12.0 - 16.0 g/dL 12.4 13.4  Hematocrit 35.0 - 47.0 % 36.9 38.8  Platelets 150 - 440 K/uL 284 433     Lab Results  Component Value Date   CREATININE 0.98 04/16/2018   CREATININE 0.92 04/08/2018     Assessment: s/p Procedure(s): HYSTERECTOMY ABDOMINAL WITH BILATERAL SALPINGECTOMY (Bilateral): stable, tolerating diet H/o chronic pain H/o substance abuse Anxiety HTN   Plan: Regular diet as tolerated.  Encourage ambulation Continue PO pain medication scheduled. Continue Xanax for her anxiety.  Continue  home BP meds Dispo: home today.    LOS: 3 days       Rubie Maid, MD [facility]

## 2018-04-18 NOTE — Progress Notes (Signed)
Post-Operative Day # 3, s/p TAH with bilateral salpingectomy. Performed for menorrhagia, enalrged fibroid uterus, pelvic pain, dysmenorrhea. PMH of chronic pain, substance abuse.   Subjective: Patient ambulating voiding, has passed gas.  Tolerating diet. Still continues to complain of 10/10 pain at times. Has passed flatus. Is currently using Toradol, IV Dilaudid, and Hydrocodone scheduled.   Objective: Vitals:   04/17/18 1906 04/18/18 0008 04/18/18 0418 04/18/18 0827  BP: (!) 143/70 (!) 195/97 (!) 149/89 131/82  Pulse: 83 87 78 69  Resp: 20 18 20 18   Temp: 98.5 F (36.9 C) 98.4 F (36.9 C) 98.9 F (37.2 C) 98.6 F (37 C)  TempSrc: Oral Oral Oral Oral  SpO2: 99% 100% 100% 99%  Weight:      Height:        Physical Exam:  General: alert and no distress  Lungs: clear to auscultation bilaterally Heart: regular rate and rhythm, S1, S2 normal, no murmur, click, rub or gallop Abdomen: soft, non-tender; bowel sounds normal; no masses,  no organomegaly Pelvis:Bleeding: appropriate, scant,  Incision: healing well, no significant drainage, no dehiscence, no significant erythema Extremities: DVT Evaluation: No evidence of DVT seen on physical exam. Negative Homan's sign. No cords or calf tenderness. No significant calf/ankle edema.   Labs:  CBC Latest Ref Rng & Units 04/16/2018 04/08/2018  WBC 3.6 - 11.0 K/uL 15.7(H) 7.8  Hemoglobin 12.0 - 16.0 g/dL 12.4 13.4  Hematocrit 35.0 - 47.0 % 36.9 38.8  Platelets 150 - 440 K/uL 284 433     Lab Results  Component Value Date   CREATININE 0.98 04/16/2018   CREATININE 0.92 04/08/2018     Assessment: s/p Procedure(s): HYSTERECTOMY ABDOMINAL WITH BILATERAL SALPINGECTOMY (Bilateral): stable, tolerating diet and post-operative pain uncontrolled.   H/o chronic pain H/o substance abuse Anxiety HTN   Plan: Regular diet as tolerated.  Encourage ambulation Continue PO pain medication scheduled, Toradol and On-Q pump.    Continue Xanax for  her anxiety.  Continue home BP meds Dispo: home today.    LOS: 3 days       Rubie Maid, MD [facility]

## 2018-04-18 NOTE — Progress Notes (Signed)
All discharge instructions reviewed with pt at 1930; information regarding on-q pump removal given to pt; on-q pump to be removed tomorrow morning; pt's significant other went to get prescription meds; pt discharged at this time via wheelchair escorted to car by RN; pt going home with significant other

## 2018-04-18 NOTE — Discharge Instructions (Signed)
REMOVE ON-Q PAIN PUMP ON POST-OPERATIVE DAY 5 (WHEN ALL MEDICINE IS DRAINED FROM BALL)  Abdominal Hysterectomy, Care After This sheet gives you information about how to care for yourself after your procedure. Your health care provider may also give you more specific instructions. If you have problems or questions, contact your health care provider. What can I expect after the procedure? After your procedure, it is common to have:  Pain.  Fatigue.  Poor appetite.  Less interest in sex.  Vaginal bleeding and discharge. You may need to use a sanitary napkin after this procedure.  Follow these instructions at home: Bathing  Do not take baths, swim, or use a hot tub until your health care provider approves. Ask your health care provider if you can take showers. You may only be allowed to take sponge baths for bathing.  Keep the bandage (dressing) dry until your health care provider says it can be removed. Incision care  Follow instructions from your health care provider about how to take care of your incision. Make sure you: ? Wash your hands with soap and water before you change your bandage (dressing). If soap and water are not available, use hand sanitizer. ? Change your dressing as told by your health care provider. ? Leave stitches (sutures), skin glue, or adhesive strips in place. These skin closures may need to stay in place for 2 weeks or longer. If adhesive strip edges start to loosen and curl up, you may trim the loose edges. Do not remove adhesive strips completely unless your health care provider tells you to do that.  Check your incision area every day for signs of infection. Check for: ? Redness, swelling, or pain. ? Fluid or blood. ? Warmth. ? Pus or a bad smell. Activity  Do gentle, daily exercises as told by your health care provider. You may be told to take short walks every day and go farther each time.  Do not lift anything that is heavier than 10 lb (4.5 kg), or  the limit that your health care provider tells you, until he or she says that it is safe.  Do not drive or use heavy machinery while taking prescription pain medicine.  Do not drive for 24 hours if you were given a medicine to help you relax (sedative).  Follow your health care provider's instructions about exercise, driving, and general activities. Ask your health care provider what activities are safe for you. Lifestyle  Do not douche, use tampons, or have sex for at least 6 weeks or as told by your health care provider.  Do not drink alcohol until your health care provider approves.  Drink enough fluid to keep your urine clear or pale yellow.  Try to have someone at home with you for the first 1-2 weeks to help.  Do not use any products that contain nicotine or tobacco, such as cigarettes and e-cigarettes. These can delay healing. If you need help quitting, ask your health care provider. General instructions  Take over-the-counter and prescription medicines only as told by your health care provider.  Do not take aspirin or ibuprofen. These medicines can cause bleeding.  To prevent or treat constipation while you are taking prescription pain medicine, your health care provider may recommend that you: ? Drink enough fluid to keep your urine clear or pale yellow. ? Take over-the-counter or prescription medicines. ? Eat foods that are high in fiber, such as fresh fruits and vegetables, whole grains, and beans. ? Limit foods that  are high in fat and processed sugars, such as fried and sweet foods.  Keep all follow-up visits as told by your health care provider. This is important. Contact a health care provider if:  You have chills or fever.  You have redness, swelling, or pain around your incision.  You have fluid or blood coming from your incision.  Your incision feels warm to the touch.  You have pus or a bad smell coming from your incision.  Your incision breaks  open.  You feel dizzy or light-headed.  You have pain or bleeding when you urinate.  You have persistent diarrhea.  You have persistent nausea and vomiting.  You have abnormal vaginal discharge.  You have a rash.  You have any type of abnormal reaction or you develop an allergy to your medicine.  Your pain medicine does not help. Get help right away if:  You have a fever and your symptoms suddenly get worse.  You have severe abdominal pain.  You have shortness of breath.  You faint.  You have pain, swelling, or redness in your leg.  You have heavy vaginal bleeding with blood clots. Summary  After your procedure, it is common to have pain, fatigue and vaginal discharge.  Do not take baths, swim, or use a hot tub until your health care provider approves. Ask your health care provider if you can take showers. You may only be allowed to take sponge baths for bathing.  Follow your health care provider's instructions about exercise, driving, and general activities. Ask your health care provider what activities are safe for you.  Do not lift anything that is heavier than 10 lb (4.5 kg), or the limit that your health care provider tells you, until he or she says that it is safe.  Try to have someone at home with you for the first 1-2 weeks to help. This information is not intended to replace advice given to you by your health care provider. Make sure you discuss any questions you have with your health care provider. Document Released: 01/20/2005 Document Revised: 06/21/2016 Document Reviewed: 06/21/2016 Elsevier Interactive Patient Education  2017 [employer].

## 2018-04-18 NOTE — Progress Notes (Signed)
Dr. Marcelline Mates notified patient still not feeling well this afternoon due to N/V related to constipation per patient and surgical pain. PRN Zofran given after emesis around 12pm with minimal relief. Patient was able to take PO pain medications at 1418. Per Dr. Marcelline Mates give PRN Magnesium Citrate once for constipation. Patient updated and aware of current plan of care.

## 2018-04-20 ENCOUNTER — Inpatient Hospital Stay
Admission: EM | Admit: 2018-04-20 | Discharge: 2018-04-23 | DRG: 394 | Disposition: A | Discharge status: Home / Self Care | Payer: Medicaid Other | Attending: Obstetrics and Gynecology | Admitting: Obstetrics and Gynecology

## 2018-04-20 ENCOUNTER — Observation Stay: Payer: Medicaid Other

## 2018-04-20 ENCOUNTER — Encounter: Payer: Self-pay | Admitting: Emergency Medicine

## 2018-04-20 ENCOUNTER — Emergency Department: Payer: Medicaid Other

## 2018-04-20 DIAGNOSIS — K9189 Other postprocedural complications and disorders of digestive system: Principal | ICD-10-CM | POA: Diagnosis present

## 2018-04-20 DIAGNOSIS — E876 Hypokalemia: Secondary | ICD-10-CM | POA: Diagnosis present

## 2018-04-20 DIAGNOSIS — F17210 Nicotine dependence, cigarettes, uncomplicated: Secondary | ICD-10-CM | POA: Diagnosis present

## 2018-04-20 DIAGNOSIS — K567 Ileus, unspecified: Secondary | ICD-10-CM | POA: Diagnosis present

## 2018-04-20 DIAGNOSIS — R140 Abdominal distension (gaseous): Secondary | ICD-10-CM

## 2018-04-20 DIAGNOSIS — I10 Essential (primary) hypertension: Secondary | ICD-10-CM | POA: Diagnosis present

## 2018-04-20 DIAGNOSIS — K5909 Other constipation: Secondary | ICD-10-CM | POA: Diagnosis present

## 2018-04-20 DIAGNOSIS — G8929 Other chronic pain: Secondary | ICD-10-CM | POA: Diagnosis present

## 2018-04-20 DIAGNOSIS — K56609 Unspecified intestinal obstruction, unspecified as to partial versus complete obstruction: Secondary | ICD-10-CM

## 2018-04-20 DIAGNOSIS — R109 Unspecified abdominal pain: Secondary | ICD-10-CM

## 2018-04-20 LAB — CBC
HCT: 39.7 % (ref 35.0–47.0)
Hemoglobin: 13 g/dL (ref 12.0–16.0)
MCH: 29.9 pg (ref 26.0–34.0)
MCHC: 32.9 g/dL (ref 32.0–36.0)
MCV: 91.1 fL (ref 80.0–100.0)
PLATELETS: 380 10*3/uL (ref 150–440)
RBC: 4.36 MIL/uL (ref 3.80–5.20)
RDW: 14.4 % (ref 11.5–14.5)
WBC: 6.4 10*3/uL (ref 3.6–11.0)

## 2018-04-20 LAB — COMPREHENSIVE METABOLIC PANEL
ALK PHOS: 43 U/L (ref 38–126)
ALT: 9 U/L (ref 0–44)
AST: 11 U/L — ABNORMAL LOW (ref 15–41)
Albumin: 3 g/dL — ABNORMAL LOW (ref 3.5–5.0)
Anion gap: 9 (ref 5–15)
BUN: 11 mg/dL (ref 6–20)
CHLORIDE: 102 mmol/L (ref 98–111)
CO2: 27 mmol/L (ref 22–32)
CREATININE: 0.67 mg/dL (ref 0.44–1.00)
Calcium: 8.3 mg/dL — ABNORMAL LOW (ref 8.9–10.3)
GLUCOSE: 93 mg/dL (ref 70–99)
Potassium: 3.4 mmol/L — ABNORMAL LOW (ref 3.5–5.1)
Sodium: 138 mmol/L (ref 135–145)
Total Bilirubin: 0.5 mg/dL (ref 0.3–1.2)
Total Protein: 6.5 g/dL (ref 6.5–8.1)

## 2018-04-20 LAB — LIPASE, BLOOD: Lipase: 19 U/L (ref 11–51)

## 2018-04-20 MED ORDER — ONDANSETRON HCL 4 MG/2ML IJ SOLN
4.0000 mg | Freq: Once | INTRAMUSCULAR | Status: AC
  Administered 2018-04-20: 4 mg via INTRAVENOUS
  Filled 2018-04-20: qty 2

## 2018-04-20 MED ORDER — SODIUM CHLORIDE 0.9 % IV SOLN
Freq: Once | INTRAVENOUS | Status: AC
  Administered 2018-04-20: 17:00:00 via INTRAVENOUS

## 2018-04-20 MED ORDER — KETOROLAC TROMETHAMINE 30 MG/ML IJ SOLN
30.0000 mg | Freq: Four times a day (QID) | INTRAMUSCULAR | Status: DC
  Filled 2018-04-20: qty 1

## 2018-04-20 MED ORDER — SODIUM CHLORIDE 0.9 % IV BOLUS
1000.0000 mL | Freq: Once | INTRAVENOUS | Status: AC
  Administered 2018-04-20: 1000 mL via INTRAVENOUS

## 2018-04-20 MED ORDER — SODIUM CHLORIDE 0.9 % IV BOLUS: 1000.0000 mL | Freq: Once | INTRAVENOUS | Status: DC

## 2018-04-20 MED ORDER — ONDANSETRON HCL 4 MG PO TABS: 4.0000 mg | ORAL_TABLET | Freq: Four times a day (QID) | ORAL | Status: DC | PRN

## 2018-04-20 MED ORDER — IOPAMIDOL (ISOVUE-300) INJECTION 61%
100.0000 mL | Freq: Once | INTRAVENOUS | Status: AC | PRN
  Administered 2018-04-20: 100 mL via INTRAVENOUS

## 2018-04-20 MED ORDER — POTASSIUM CHLORIDE 10 MEQ/100ML IV SOLN
10.0000 meq | INTRAVENOUS | Status: AC
  Administered 2018-04-21 (×3): 10 meq via INTRAVENOUS
  Filled 2018-04-20 (×3): qty 100

## 2018-04-20 MED ORDER — LEVOFLOXACIN IN D5W 500 MG/100ML IV SOLN
500.0000 mg | INTRAVENOUS | Status: DC
  Administered 2018-04-21: 500 mg via INTRAVENOUS
  Filled 2018-04-20 (×2): qty 100

## 2018-04-20 MED ORDER — DEXTROSE IN LACTATED RINGERS 5 % IV SOLN
INTRAVENOUS | Status: DC
  Administered 2018-04-21 – 2018-04-23 (×8): via INTRAVENOUS

## 2018-04-20 MED ORDER — ACETAMINOPHEN 325 MG PO TABS: 650.0000 mg | ORAL_TABLET | ORAL | Status: DC | PRN

## 2018-04-20 MED ORDER — HYDROMORPHONE HCL 1 MG/ML IJ SOLN
0.5000 mg | Freq: Once | INTRAMUSCULAR | Status: AC
  Administered 2018-04-20: 0.5 mg via INTRAVENOUS
  Filled 2018-04-20: qty 1

## 2018-04-20 MED ORDER — ONDANSETRON HCL 4 MG/2ML IJ SOLN
4.0000 mg | Freq: Four times a day (QID) | INTRAMUSCULAR | Status: DC | PRN
  Administered 2018-04-21: 4 mg via INTRAVENOUS
  Filled 2018-04-20: qty 2

## 2018-04-20 MED ORDER — HYDROMORPHONE HCL 1 MG/ML IJ SOLN
1.0000 mg | Freq: Once | INTRAMUSCULAR | Status: AC
  Administered 2018-04-20: 1 mg via INTRAVENOUS
  Filled 2018-04-20: qty 1

## 2018-04-20 MED ORDER — KETOROLAC TROMETHAMINE 30 MG/ML IJ SOLN
15.0000 mg | Freq: Once | INTRAMUSCULAR | Status: AC
  Administered 2018-04-20: 15 mg via INTRAVENOUS

## 2018-04-20 MED ORDER — KETOROLAC TROMETHAMINE 30 MG/ML IJ SOLN
30.0000 mg | Freq: Four times a day (QID) | INTRAMUSCULAR | Status: DC
  Administered 2018-04-21 – 2018-04-23 (×10): 30 mg via INTRAVENOUS
  Filled 2018-04-20 (×9): qty 1

## 2018-04-20 MED ORDER — SIMETHICONE 80 MG PO CHEW: 80.0000 mg | CHEWABLE_TABLET | Freq: Four times a day (QID) | ORAL | Status: DC | PRN

## 2018-04-20 MED ORDER — KETOROLAC TROMETHAMINE 30 MG/ML IJ SOLN
INTRAMUSCULAR | Status: AC
  Filled 2018-04-20: qty 1

## 2018-04-20 NOTE — ED Provider Notes (Signed)
[facility] Emergency Department Provider Note  ____________________________________________  Time seen: Approximately 6:02 PM  I have reviewed the triage vital signs and the nursing notes.   HISTORY  Chief Complaint Emesis and Loss of Consciousness    HPI Sarah Sosa is a 37 y.o. female with a history of chronic pain, GERD, hypertension, who is recently status post abdominal hysterectomy  performed 5 days ago.  She reports that since then she has had ongoing abdominal pain, inability to have a bowel movement, inability to tolerate oral intake, and profuse vomiting for the past few days.  Complains of abdominal distention as well.  No vaginal discharge or bleeding.  Pain is constant, severe, worse with movement, no alleviating factors.  Denies fever.  Positive chills     Past Medical History:  Diagnosis Date  . Arthritis   . Asthma   . Chronic pain disorder 03/19/2018  . GERD (gastroesophageal reflux disease)   . Headache   . Heart murmur   . History of trichomoniasis 03/19/2018  . Hypertension   . PID (pelvic inflammatory disease) 02/14/2018  . Uterine leiomyoma 03/19/2018     Patient Active Problem List   Diagnosis Date Noted  . Ileus ([place]) 04/21/2018  . Ileus, postoperative ([place]) 04/20/2018  . Postoperative state 04/15/2018  . Uterine leiomyoma 03/19/2018  . Menorrhagia with regular cycle 03/19/2018  . Chronic pain disorder 03/19/2018  . Pelvic pain 03/19/2018  . Essential hypertension 03/19/2018     Past Surgical History:  Procedure Laterality Date  . CHOLECYSTECTOMY    . DENTAL SURGERY    . HYSTERECTOMY ABDOMINAL WITH SALPINGECTOMY Bilateral 04/15/2018   Procedure: HYSTERECTOMY ABDOMINAL WITH BILATERAL SALPINGECTOMY;  Surgeon: Rubie Maid, MD;  Location: [facility] ORS;  Service: Gynecology;  Laterality: Bilateral;  . kenn surgery Bilateral   . KNEE ARTHROSCOPY Left 2012, 2013     Prior to Admission medications   Medication Sig Start Date  End Date Taking? Authorizing Provider  acetaminophen (TYLENOL) 500 MG tablet Take 1,000-1,500 mg by mouth 2 (two) times daily as needed for moderate pain.   Yes [provider]  ALPRAZolam (XANAX) 0.25 MG tablet Take 0.25 mg by mouth 2 (two) times daily.   Yes [provider]  cyclobenzaprine (FLEXERIL) 10 MG tablet Take 1 tablet (10 mg total) by mouth 3 (three) times daily as needed for muscle spasms. 05/04/17  Yes Darel Hong, MD  diphenhydramine-acetaminophen (TYLENOL PM) 25-500 MG TABS tablet Take 2 tablets by mouth at bedtime as needed (sleep).   Yes [provider]  docusate sodium (COLACE) 100 MG capsule Take 1 capsule (100 mg total) by mouth 2 (two) times daily as needed. 04/18/18  Yes Rubie Maid, MD  fluticasone (FLONASE) 50 MCG/ACT nasal spray Place 1-2 sprays into both nostrils daily as needed for allergies or rhinitis.   Yes [provider]  gabapentin (NEURONTIN) 300 MG capsule Take 1 capsule (300 mg total) by mouth 3 (three) times daily for 7 days. For nerve pain after surgery 04/18/18 04/25/18 Yes Rubie Maid, MD  ketorolac (TORADOL) 10 MG tablet Take 1 tablet (10 mg total) by mouth every 6 (six) hours as needed for moderate pain or severe pain. 04/18/18  Yes Rubie Maid, MD  lisinopril-hydrochlorothiazide (ZESTORETIC) 10-12.5 MG tablet Take 1 tablet by mouth daily. 03/19/18  Yes Rubie Maid, MD  oxyCODONE (ROXICODONE) 5 MG immediate release tablet Take 1 tablet (5 mg total) by mouth every 4 (four) hours as needed for up to 5 days for breakthrough  pain. 04/18/18 04/23/18 Yes Rubie Maid, MD  oxyCODONE-acetaminophen (PERCOCET) 7.5-325 MG tablet Take 1 tablet by mouth 4 (four) times daily.    Yes [provider]     Allergies Latex; Nsaids; Tomato; and Tramadol   Family History  Problem Relation Age of Onset  . Fibroids Mother   . Hypertension Mother   . Arthritis Mother     Social History Social History   Tobacco Use  .  Smoking status: Current Every Day Smoker    Packs/day: 0.50    Years: 22.00    Pack years: 11.00    Types: Cigarettes  . Smokeless tobacco: Never Used  Substance Use Topics  . Alcohol use: Yes    Comment: occasional  . Drug use: Yes    Types: Marijuana    Review of Systems  Constitutional:   No fever or chills.  ENT:   No sore throat. No rhinorrhea. Cardiovascular:   No chest pain or syncope. Respiratory:   No dyspnea or cough. Gastrointestinal: Positive as above for abdominal pain, vomiting, and constipation Musculoskeletal:   Negative for focal pain or swelling All other systems reviewed and are negative except as documented above in ROS and HPI.  ____________________________________________   PHYSICAL EXAM:  VITAL SIGNS: ED Triage Vitals  Enc Vitals Group     BP 04/20/18 1600 (!) 154/97     Pulse Rate 04/20/18 1600 100     Resp 04/20/18 1600 10     Temp 04/20/18 1600 98.2 F (36.8 C)     Temp Source 04/20/18 1600 Oral     SpO2 04/20/18 1600 97 %     Weight 04/20/18 1614 210 lb (95.3 kg)     Height 04/20/18 1614 5\' 6"  (1.676 m)     Head Circumference --      Peak Flow --      Pain Score 04/20/18 1613 10     Pain Loc --      Pain Edu? --      Excl. in [place]? --     Vital signs reviewed, nursing assessments reviewed.   Constitutional:   Alert and oriented. Non-toxic appearance. Eyes:   Conjunctivae are normal. EOMI. PERRL. ENT      Head:   Normocephalic and atraumatic.      Nose:   No congestion/rhinnorhea.       Mouth/Throat:   Dry mucous membranes, no pharyngeal erythema. No peritonsillar mass.       Neck:   No meningismus. Full ROM. Hematological/Lymphatic/Immunilogical:   No cervical lymphadenopathy. Cardiovascular:   RRR. Symmetric bilateral radial and DP pulses.  No murmurs. Cap refill less than 2 seconds. Respiratory:   Normal respiratory effort without tachypnea/retractions. Breath sounds are clear and equal bilaterally. No  wheezes/rales/rhonchi. Gastrointestinal:   Soft with moderate distention and generalized tenderness.  Pfannenstiel incision is well-healed. No rebound, rigidity, or guarding. Musculoskeletal:   Normal range of motion in all extremities. No joint effusions.  No lower extremity tenderness.  No edema. Neurologic:   Normal speech and language.  Motor grossly intact. No acute focal neurologic deficits are appreciated.  Skin:    Skin is warm, dry and intact. No rash noted.  No petechiae, purpura, or bullae.  ____________________________________________    LABS (pertinent positives/negatives) (all labs ordered are listed, but only abnormal results are displayed) Labs Reviewed  URINALYSIS, COMPLETE (UACMP) WITH MICROSCOPIC - Abnormal; Notable for the following components:      Result Value   Color, Urine YELLOW (*)  APPearance CLEAR (*)    Specific Gravity, Urine >1.046 (*)    Ketones, ur 5 (*)    Protein, ur 30 (*)    All other components within normal limits  COMPREHENSIVE METABOLIC PANEL - Abnormal; Notable for the following components:   Potassium 3.4 (*)    Calcium 8.3 (*)    Albumin 3.0 (*)    AST 11 (*)    All other components within normal limits  URINE DRUG SCREEN, QUALITATIVE ([facility] ONLY) - Abnormal; Notable for the following components:   Tricyclic, Ur Screen POSITIVE (*)    Opiate, Ur Screen POSITIVE (*)    Cannabinoid 50 Ng, Ur [place] POSITIVE (*)    Benzodiazepine, Ur Scrn POSITIVE (*)    All other components within normal limits  COMPREHENSIVE METABOLIC PANEL - Abnormal; Notable for the following components:   Glucose, Bld 108 (*)    Calcium 8.4 (*)    Total Protein 6.3 (*)    Albumin 2.8 (*)    AST 11 (*)    Alkaline Phosphatase 37 (*)    All other components within normal limits  CBC  LIPASE, BLOOD  CBC   ____________________________________________   EKG    ____________________________________________    RADIOLOGY  Dg Abd Portable 2v  Result Date:  04/21/2018 CLINICAL DATA:  Small bowel obstruction, recent hysterectomy EXAM: PORTABLE ABDOMEN - 2 VIEW COMPARISON:  04/20/2018 abdominal radiograph FINDINGS: Enteric tube terminates in the proximal stomach. Moderately dilated small bowel loops throughout the abdomen, not appreciably changed. No evidence of pneumatosis or pneumoperitoneum. Excreted contrast in the bladder. Clear lung bases. No radiopaque nephrolithiasis. IMPRESSION: Enteric tube terminates in the proximal stomach. Stable moderate small bowel dilatation throughout the abdomen compatible with diffuse adynamic ileus or distal small bowel obstruction. Electronically Signed   By: Ilona Sorrel M.D.   On: 04/21/2018 10:12    ____________________________________________   PROCEDURES Procedures Peripheral IV insertion by physician Indication: Multiple failed attempts by nursing staff, need for IV access and/or blood samples for workup Performed under continuous real-time ultrasound visualization Area cleaned with chlorhexidine. 20-gauge IV successfully placed in the right basilic vein in mid-upper arm 1 attempt, no complications, EBL 0.   ____________________________________________  DIFFERENTIAL DIAGNOSIS   Bowel perforation, obstruction, intra-abdominal abscess, constipation  CLINICAL IMPRESSION / ASSESSMENT AND PLAN / ED COURSE  Pertinent labs & imaging results that were available during my care of the patient were reviewed by me and considered in my medical decision making (see chart for details).    Patient presents with severe pain in the abdomen, less than 1 week after abdominal surgery for hysterectomy.  Patient is not septic, doubt vascular injury.  Possible bowel injury versus infectious complication related to procedure.  Recent surgery and pain medicine use could be causing ileus or constipation as well.  IV placed by me under ultrasound visualization on arrival to treatment room to expedite work-up and management with  IV fluids, Dilaudid 1 mg IV, Zofran 4 mill grams IV.  Clinical Course as of Apr 21 2330  Sat Apr 20, 2018  2030 Labs unremarkable.  Still waiting for CT.   [PS]    Clinical Course User Index [PS] Carrie Mew, MD     ____________________________________________   FINAL CLINICAL IMPRESSION(S) / ED DIAGNOSES    Final diagnoses:  Abdominal pain, unspecified abdominal location     ED Discharge Orders    None      Portions of this note were generated with dragon dictation software. Dictation errors  may occur despite best attempts at proofreading.    Carrie Mew, MD 04/21/18 2332

## 2018-04-20 NOTE — ED Provider Notes (Signed)
T scan shows possible pyelonephritis also cannot ruled out small bowel obstruction or possibly just severe adynamic ileus.  Patient is not passing gas she has severe pain.  I will call OB/GYN to see if they want to admit her.   Nena Polio, MD 04/20/18 2159

## 2018-04-20 NOTE — ED Notes (Signed)
Attempt to call report.

## 2018-04-20 NOTE — ED Triage Notes (Signed)
Patient presents to the ED for nausea and vomiting since Wednesday.  Patient had a hysterectomy on Monday and was d/c from the hospital on Wednesday.  Per patient the only thing she has had to eat since then is an apple.  Patient denies bleeding.  Patient appears very lethargic in triage.  Patient slumped over asleep twice during triage.  Patient's significant other states that patient passed out twice at home today.  He also states patient vomited x 5 today.

## 2018-04-20 NOTE — ED Notes (Signed)
Unable to pull from IV pt request hand stick

## 2018-04-20 NOTE — ED Provider Notes (Signed)
Gust with Dr. Amalia Hailey on call for Dr. Marcelline Mates.  He wants [place] to put an NG tube and I will do this I will also give her some pain medication she is complaining severe pain.  He will admit her and we will go from there.  She may have the pyelonephritis she has either adynamic ileus or bowel obstruction again we will get her admitted.   Nena Polio, MD 04/20/18 2214

## 2018-04-21 ENCOUNTER — Observation Stay: Payer: Medicaid Other

## 2018-04-21 ENCOUNTER — Other Ambulatory Visit: Payer: Self-pay

## 2018-04-21 DIAGNOSIS — K5909 Other constipation: Secondary | ICD-10-CM | POA: Diagnosis present

## 2018-04-21 DIAGNOSIS — G8929 Other chronic pain: Secondary | ICD-10-CM | POA: Diagnosis present

## 2018-04-21 DIAGNOSIS — F17210 Nicotine dependence, cigarettes, uncomplicated: Secondary | ICD-10-CM | POA: Diagnosis present

## 2018-04-21 DIAGNOSIS — R109 Unspecified abdominal pain: Secondary | ICD-10-CM | POA: Diagnosis present

## 2018-04-21 DIAGNOSIS — K567 Ileus, unspecified: Secondary | ICD-10-CM | POA: Diagnosis present

## 2018-04-21 DIAGNOSIS — F1120 Opioid dependence, uncomplicated: Secondary | ICD-10-CM

## 2018-04-21 DIAGNOSIS — K9189 Other postprocedural complications and disorders of digestive system: Secondary | ICD-10-CM | POA: Diagnosis present

## 2018-04-21 DIAGNOSIS — I10 Essential (primary) hypertension: Secondary | ICD-10-CM | POA: Diagnosis present

## 2018-04-21 DIAGNOSIS — E876 Hypokalemia: Secondary | ICD-10-CM | POA: Diagnosis present

## 2018-04-21 LAB — CBC
HEMATOCRIT: 36.9 % (ref 35.0–47.0)
Hemoglobin: 12 g/dL (ref 12.0–16.0)
MCH: 29.7 pg (ref 26.0–34.0)
MCHC: 32.5 g/dL (ref 32.0–36.0)
MCV: 91.3 fL (ref 80.0–100.0)
Platelets: 318 10*3/uL (ref 150–440)
RBC: 4.04 MIL/uL (ref 3.80–5.20)
RDW: 14.5 % (ref 11.5–14.5)
WBC: 5.9 10*3/uL (ref 3.6–11.0)

## 2018-04-21 LAB — COMPREHENSIVE METABOLIC PANEL
ALK PHOS: 37 U/L — AB (ref 38–126)
ALT: 7 U/L (ref 0–44)
AST: 11 U/L — ABNORMAL LOW (ref 15–41)
Albumin: 2.8 g/dL — ABNORMAL LOW (ref 3.5–5.0)
Anion gap: 10 (ref 5–15)
BUN: 9 mg/dL (ref 6–20)
CALCIUM: 8.4 mg/dL — AB (ref 8.9–10.3)
CO2: 28 mmol/L (ref 22–32)
CREATININE: 0.71 mg/dL (ref 0.44–1.00)
Chloride: 100 mmol/L (ref 98–111)
Glucose, Bld: 108 mg/dL — ABNORMAL HIGH (ref 70–99)
Potassium: 3.5 mmol/L (ref 3.5–5.1)
Sodium: 138 mmol/L (ref 135–145)
Total Bilirubin: 0.4 mg/dL (ref 0.3–1.2)
Total Protein: 6.3 g/dL — ABNORMAL LOW (ref 6.5–8.1)

## 2018-04-21 LAB — URINALYSIS, COMPLETE (UACMP) WITH MICROSCOPIC
Bacteria, UA: NONE SEEN
Bilirubin Urine: NEGATIVE
GLUCOSE, UA: NEGATIVE mg/dL
HGB URINE DIPSTICK: NEGATIVE
Ketones, ur: 5 mg/dL — AB
Leukocytes, UA: NEGATIVE
Nitrite: NEGATIVE
Protein, ur: 30 mg/dL — AB
pH: 6 (ref 5.0–8.0)

## 2018-04-21 LAB — URINE DRUG SCREEN, QUALITATIVE (ARMC ONLY)
AMPHETAMINES, UR SCREEN: NOT DETECTED
Barbiturates, Ur Screen: NOT DETECTED
Benzodiazepine, Ur Scrn: POSITIVE — AB
Cannabinoid 50 Ng, Ur ~~LOC~~: POSITIVE — AB
Cocaine Metabolite,Ur ~~LOC~~: NOT DETECTED
MDMA (ECSTASY) UR SCREEN: NOT DETECTED
Methadone Scn, Ur: NOT DETECTED
OPIATE, UR SCREEN: POSITIVE — AB
PHENCYCLIDINE (PCP) UR S: NOT DETECTED
Tricyclic, Ur Screen: POSITIVE — AB

## 2018-04-21 MED ORDER — PHENOL 1.4 % MT LIQD
1.0000 | OROMUCOSAL | Status: DC | PRN
  Filled 2018-04-21: qty 177

## 2018-04-21 MED ORDER — DIPHENHYDRAMINE HCL 50 MG/ML IJ SOLN
25.0000 mg | Freq: Four times a day (QID) | INTRAMUSCULAR | Status: DC | PRN
  Administered 2018-04-21 – 2018-04-23 (×6): 25 mg via INTRAVENOUS
  Filled 2018-04-21 (×6): qty 1

## 2018-04-21 MED ORDER — SODIUM CHLORIDE 0.9 % IV SOLN
INTRAVENOUS | Status: DC | PRN
  Administered 2018-04-21: 250 mL via INTRAVENOUS

## 2018-04-21 MED ORDER — LORAZEPAM 2 MG/ML IJ SOLN
0.5000 mg | INTRAMUSCULAR | Status: DC | PRN
  Administered 2018-04-21 – 2018-04-23 (×6): 0.5 mg via INTRAVENOUS
  Filled 2018-04-21 (×6): qty 1

## 2018-04-21 NOTE — Progress Notes (Signed)
   04/21/18 1310  Clinical Encounter Type  Visited With Patient and family together  Visit Type Initial (order for advanced directive)  Referral From Nurse  Consult/Referral To Chaplain   Chaplain responded to order for advanced directive.  Patient expressed frustration with current health situation and declined visit/conversation.  She requested that chaplain leave document with her to review later.  Chaplain outlined scope of chaplain support services if patient would like to have a conversation around AD or around current feelings/experiences.  Patient indicated that she would have staff page chaplain if needed.  Chaplain followed up with patient's nurse regarding the order and patient's wishes.

## 2018-04-21 NOTE — Progress Notes (Signed)
Pt became agitated, asking to go outside, states she is withdrawing from her pain meds, says she feels claustrophobic. Made statements that she was "going to pull out this tube" or "walk out of the building". MD paged and notified. Orders placed for ativan. Pt offered nicotine patch, declined stating it makes her itch.

## 2018-04-21 NOTE — H&P (Signed)
Patient ID: Sarah Sosa, female   DOB: 1981/04/14, 37 y.o.   MRN: [id number]         ADMIT NOTE & HD#1 ROUNDING NOTE  HPI:      Sarah Sosa is a 37 y.o. G3P0 who underwent abdominal hysterectomy on Monday 1 week ago.  Her postoperative course was complicated by difficulty with pain control requiring higher doses of narcotics. Sarah Sosa presented to the emergency department yesterday complaining of upper abdominal pain.  Her CT was remarkable for dilated bowel loops especially in the upper abdomen.  The possibility of pyelonephritis was also raised by the CT findings.  Her white count and H&H were normal and she was afebrile.  According to the emergency physician her abdomen was significantly distended.  And she was having nausea and vomiting in the emergency department. I admitted her last evening and had an NG tube placed which she has been draining significant amounts since that time.  Subjective: She is currently sleeping.  She had some significant vomiting this morning despite her NG tube but is now resting comfortably.          HISTORY Allergies  Allergen Reactions  . Latex Anaphylaxis, Hives and Itching  . Nsaids Other (See Comments)    Serve ulcers; stomach aches, GERD.   . Tomato Anaphylaxis, Hives and Itching  . Tramadol Itching    OB History  OB History  Gravida Para Term Preterm AB Living  3         4  SAB TAB Ectopic Multiple Live Births               # Outcome Date GA Lbr Len/2nd Weight Sex Delivery Anes PTL Lv  3 Gravida           2 Gravida           1 [place]             Past Medical History  Past Medical History:  Diagnosis Date  . Arthritis   . Asthma   . Chronic pain disorder 03/19/2018  . GERD (gastroesophageal reflux disease)   . Headache   . Heart murmur   . History of trichomoniasis 03/19/2018  . Hypertension   . PID (pelvic inflammatory disease) 02/14/2018  . Uterine leiomyoma 03/19/2018    Past Surgical History  Past Surgical History:    Procedure Laterality Date  . CHOLECYSTECTOMY    . DENTAL SURGERY    . HYSTERECTOMY ABDOMINAL WITH SALPINGECTOMY Bilateral 04/15/2018   Procedure: HYSTERECTOMY ABDOMINAL WITH BILATERAL SALPINGECTOMY;  Surgeon: Rubie Maid, MD;  Location: [facility] ORS;  Service: Gynecology;  Laterality: Bilateral;  . kenn surgery Bilateral   . KNEE ARTHROSCOPY Left 2012, 2013      Past Social History:  Social History   Socioeconomic History  . Marital status: Significant Other    Spouse name: Not on file  . Number of children: Not on file  . Years of education: Not on file  . Highest education level: Not on file  Occupational History  . Not on file  Social Needs  . Financial resource strain: Not on file  . Food insecurity:    Worry: Not on file    Inability: Not on file  . Transportation needs:    Medical: Not on file    Non-medical: Not on file  Tobacco Use  . Smoking status: Current Every Day Smoker    Packs/day: 0.50    Years: 22.00    Pack years: 11.00  Types: Cigarettes  . Smokeless tobacco: Never Used  Substance and Sexual Activity  . Alcohol use: Yes    Comment: occasional  . Drug use: Yes    Types: Marijuana  . Sexual activity: Not on file  Lifestyle  . Physical activity:    Days per week: Not on file    Minutes per session: Not on file  . Stress: Not on file  Relationships  . Social connections:    Talks on phone: Not on file    Gets together: Not on file    Attends religious service: Not on file    Active member of club or organization: Not on file    Attends meetings of clubs or organizations: Not on file    Relationship status: Not on file  Other Topics Concern  . Not on file  Social History Narrative  . Not on file    Family History  Family History  Problem Relation Age of Onset  . Fibroids Mother   . Hypertension Mother   . Arthritis Mother      ROS: Constitutional: Denied constitutional symptoms, night sweats, recent illness, fatigue, fever,  insomnia and weight loss.  Eyes: Denied eye symptoms, eye pain, photophobia, vision change and visual disturbance.  Ears/Nose/Throat/Neck: Denied ear, nose, throat or neck symptoms, hearing loss, nasal discharge, sinus congestion and sore throat.  Cardiovascular: Denied cardiovascular symptoms, arrhythmia, chest pain/pressure, edema, exercise intolerance, orthopnea and palpitations.  Respiratory: Denied pulmonary symptoms, asthma, pleuritic pain, productive sputum, cough, dyspnea and wheezing.  Gastrointestinal: See HPI for additional information.  Genitourinary: Denied genitourinary symptoms including symptomatic vaginal discharge, pelvic relaxation issues, and urinary complaints.  Musculoskeletal: Denied musculoskeletal symptoms, stiffness, swelling, muscle weakness and myalgia.  Dermatologic: Denied dermatology symptoms, rash and scar.  Neurologic: Denied neurology symptoms, dizziness, headache, neck pain and syncope.  Psychiatric: Denied psychiatric symptoms, anxiety and depression.  Endocrine: Denied endocrine symptoms including hot flashes and night sweats.   Medications    Current Discharge Medication List    CONTINUE these medications which have NOT CHANGED   Details  acetaminophen (TYLENOL) 500 MG tablet Take 1,000-1,500 mg by mouth 2 (two) times daily as needed for moderate pain.    ALPRAZolam (XANAX) 0.25 MG tablet Take 0.25 mg by mouth 2 (two) times daily.    cyclobenzaprine (FLEXERIL) 10 MG tablet Take 1 tablet (10 mg total) by mouth 3 (three) times daily as needed for muscle spasms. Qty: 30 tablet, Refills: 0    diphenhydramine-acetaminophen (TYLENOL PM) 25-500 MG TABS tablet Take 2 tablets by mouth at bedtime as needed (sleep).    docusate sodium (COLACE) 100 MG capsule Take 1 capsule (100 mg total) by mouth 2 (two) times daily as needed. Qty: 30 capsule, Refills: 2    fluticasone (FLONASE) 50 MCG/ACT nasal spray Place 1-2 sprays into both nostrils daily as needed for  allergies or rhinitis.    gabapentin (NEURONTIN) 300 MG capsule Take 1 capsule (300 mg total) by mouth 3 (three) times daily for 7 days. For nerve pain after surgery Qty: 21 capsule, Refills: 0    ketorolac (TORADOL) 10 MG tablet Take 1 tablet (10 mg total) by mouth every 6 (six) hours as needed for moderate pain or severe pain. Qty: 20 tablet, Refills: 0    lisinopril-hydrochlorothiazide (ZESTORETIC) 10-12.5 MG tablet Take 1 tablet by mouth daily. Qty: 30 tablet, Refills: 6    oxyCODONE (ROXICODONE) 5 MG immediate release tablet Take 1 tablet (5 mg total) by mouth every 4 (four) hours as  needed for up to 5 days for breakthrough pain. Qty: 20 tablet, Refills: 0    oxyCODONE-acetaminophen (PERCOCET) 7.5-325 MG tablet Take 1 tablet by mouth 4 (four) times daily.         Objective: Vitals:   04/21/18 0317 04/21/18 0831  BP: (!) 141/75 (!) 148/79  Pulse: 75 66  Resp: 20 18  Temp: 98.6 F (37 C) 98.2 F (36.8 C)  SpO2: 98% 100%    See ED for H&P  Abdomen: Mild to moderately distended.  No bowel sounds.  Her abdomen is relatively soft.  She does not complain of pain or wake up with abdominal palpation she continues to rest comfortably.           ASSESSMENT:  1.  Postop ileus -patient has shown significant improvement since ED visit.  NG tube continues to be productive of large amounts.  Abdomen much less distended and relatively nontender at this time. 2.  Although the possibility of pyelonephritis was noted on CT without an elevated white count and based on her urinalysis I doubt this as a diagnosis at this time.  PLAN: 1.   Continue to attempt to manage ileus without narcotic use.  Benadryl and ketorolac seem to be working.  Continue expectant management and electrolyte and fluid replacement as needed. 2.  Will stop antibiotics as pyelonephritis likely not present. 3.  General surgery consult-I spoke with Dr. Perrin Maltese regarding postoperative ileus and he will stop by to see her  today. 4.  Repeat CT tomorrow. 5.  CBC and CMP this morning.  Jeannie Fend ,MD 04/21/2018,9:11 AM

## 2018-04-21 NOTE — Progress Notes (Signed)
Pt awake, complains of discomfort to throat and nose, states"I can't take this thing in my nose anymore". Provided emotional support and education on purpose of NG tube, discussed with patient and family member at bedside. Pt compliant but seems agitated. Also stated, "my body doesn't feel right, I need to be back on my medicine". When RN inquired about this, she replied "I need my pain meds, my body doesn't feel right when its not in my system". Explained that narcotic pain meds could contribute to cause and worsening of illeus, but will pass this information along to MD.

## 2018-04-21 NOTE — Consult Note (Signed)
Patient ID: Sarah Sosa, female   DOB: [birth year], 37 y.o.   MRN: [id number]  HPI Sarah Sosa is a 37 y.o. female asked to see in consultation by Dr. Amalia Hailey  ( d/w him in detail) secondary to ileus.  Patient and abdominal hysterectomy 6 days ago by Dr. Marcelline Mates.  He came into the emergency room complaining of moderate to severe abdominal pain and nausea and vomiting.  She reports that she is always had abdominal pain and she had a history of chronic pain.  She reports significant vomiting.  The pain she reports is sharp, diffuse.  No specific alleviating or aggravating factors.  CT scan personally reviewed showing evidence of dilated loops of the small bowel.  There is no definitive transition point.  There is no evidence of free air or pneumatosis.  No evidence of abscess.  CBC and CMP were normal. I have repeated a KUB this morning showing no evidence of free air, dilated loops of SB Operative reports reviewed  HPI  Past Medical History:  Diagnosis Date  . Arthritis   . Asthma   . Chronic pain disorder 03/19/2018  . GERD (gastroesophageal reflux disease)   . Headache   . Heart murmur   . History of trichomoniasis 03/19/2018  . Hypertension   . PID (pelvic inflammatory disease) 02/14/2018  . Uterine leiomyoma 03/19/2018    Past Surgical History:  Procedure Laterality Date  . CHOLECYSTECTOMY    . DENTAL SURGERY    . HYSTERECTOMY ABDOMINAL WITH SALPINGECTOMY Bilateral 04/15/2018   Procedure: HYSTERECTOMY ABDOMINAL WITH BILATERAL SALPINGECTOMY;  Surgeon: Rubie Maid, MD;  Location: [facility] ORS;  Service: Gynecology;  Laterality: Bilateral;  . kenn surgery Bilateral   . KNEE ARTHROSCOPY Left 2012, 2013    Family History  Problem Relation Age of Onset  . Fibroids Mother   . Hypertension Mother   . Arthritis Mother     Social History Social History   Tobacco Use  . Smoking status: Current Every Day Smoker    Packs/day: 0.50    Years: 22.00    Pack years: 11.00    Types: Cigarettes  .  Smokeless tobacco: Never Used  Substance Use Topics  . Alcohol use: Yes    Comment: occasional  . Drug use: Yes    Types: Marijuana    Allergies  Allergen Reactions  . Latex Anaphylaxis, Hives and Itching  . Nsaids Other (See Comments)    Serve ulcers; stomach aches, GERD.   . Tomato Anaphylaxis, Hives and Itching  . Tramadol Itching    Current Facility-Administered Medications  Medication Dose Route Frequency Provider Last Rate Last Dose  . 0.9 %  sodium chloride infusion   Intravenous PRN Harlin Heys, MD 10 mL/hr at 04/21/18 0124 250 mL at 04/21/18 0124  . acetaminophen (TYLENOL) tablet 650 mg  650 mg Oral Q4H PRN Harlin Heys, MD      . dextrose 5 % in lactated ringers infusion   Intravenous Continuous Harlin Heys, MD 150 mL/hr at 04/21/18 1331    . diphenhydrAMINE (BENADRYL) injection 25 mg  25 mg Intravenous Q6H PRN Harlin Heys, MD   25 mg at 04/21/18 0810  . ketorolac (TORADOL) 30 MG/ML injection 30 mg  30 mg Intravenous Q6H Harlin Heys, MD   30 mg at 04/21/18 1214   Or  . ketorolac (TORADOL) 30 MG/ML injection 30 mg  30 mg Intramuscular Q6H Harlin Heys, MD      . levofloxacin [facility]) IVPB 500  mg  500 mg Intravenous Q24H Harlin Heys, MD 100 mL/hr at 04/21/18 0126 500 mg at 04/21/18 0126  . ondansetron (ZOFRAN) tablet 4 mg  4 mg Oral Q6H PRN Harlin Heys, MD       Or  . ondansetron [facility]) injection 4 mg  4 mg Intravenous Q6H PRN Harlin Heys, MD   4 mg at 04/21/18 0810  . phenol (CHLORASEPTIC) mouth spray 1 spray  1 spray Mouth/Throat PRN Harlin Heys, MD      . simethicone [facility]) chewable tablet 80 mg  80 mg Oral QID PRN Harlin Heys, MD         Review of Systems Full ROS  was asked and was negative except for the information on the HPI  Physical Exam Blood pressure 140/83, pulse 73, temperature 98.1 F (36.7 C), temperature source Oral, resp. rate 18, height 5\' 6"  (1.676 m), weight 90 kg,  last menstrual period 03/25/2018, SpO2 98 %. CONSTITUTIONAL: NAD EYES: Pupils are equal, round, and reactive to light, Sclera are non-icteric. EARS, NOSE, MOUTH AND THROAT: The oropharynx is clear. The oral mucosa is pink and moist. Hearing is intact to voice. LYMPH NODES:  Lymph nodes in the neck are normal. RESPIRATORY:  Lungs are clear. There is normal respiratory effort, with equal breath sounds bilaterally, and without pathologic use of accessory muscles. CARDIOVASCULAR: Heart is regular without murmurs, gallops, or rubs. GI: The abdomen is  soft, diffuse tenderness, no peritonitis, decrease BS, mild distension. wound healing well, no infection GU: Rectal deferred.   MUSCULOSKELETAL: Normal muscle strength and tone. No cyanosis or edema.   SKIN: Turgor is good and there are no pathologic skin lesions or ulcers. NEUROLOGIC: Motor and sensation is grossly normal. Cranial nerves are grossly intact. PSYCH:  Oriented to person, place and time. Affect is normal.  Data Reviewed  I have personally reviewed the patient's imaging, laboratory findings and medical records.    Assessment/Plan 37 year old female admitted for ileus.  I agree with current management of NG tube and n.p.o.  We will continue serial abdominal exams and will perform an additional x-ray in the morning.  Differentials will include obviously missed bowel injury.  At this time she is not septic,  not peritonitic and missed Bowel injury  is unlikely.  No need for any emergent surgical intervention at this point.    Caroleen Hamman, MD FACS General Surgeon 04/21/2018, 1:46 PM

## 2018-04-22 ENCOUNTER — Inpatient Hospital Stay: Payer: Medicaid Other

## 2018-04-22 DIAGNOSIS — E876 Hypokalemia: Secondary | ICD-10-CM

## 2018-04-22 DIAGNOSIS — K567 Ileus, unspecified: Secondary | ICD-10-CM | POA: Diagnosis not present

## 2018-04-22 DIAGNOSIS — K5909 Other constipation: Secondary | ICD-10-CM

## 2018-04-22 DIAGNOSIS — F1120 Opioid dependence, uncomplicated: Secondary | ICD-10-CM

## 2018-04-22 DIAGNOSIS — I10 Essential (primary) hypertension: Secondary | ICD-10-CM

## 2018-04-22 LAB — COMPREHENSIVE METABOLIC PANEL
ALK PHOS: 38 U/L (ref 38–126)
ALT: 8 U/L (ref 0–44)
AST: 13 U/L — ABNORMAL LOW (ref 15–41)
Albumin: 2.8 g/dL — ABNORMAL LOW (ref 3.5–5.0)
Anion gap: 7 (ref 5–15)
BUN: 6 mg/dL (ref 6–20)
CALCIUM: 8.5 mg/dL — AB (ref 8.9–10.3)
CO2: 27 mmol/L (ref 22–32)
CREATININE: 0.78 mg/dL (ref 0.44–1.00)
Chloride: 106 mmol/L (ref 98–111)
GFR calc Af Amer: >60 mL/min (ref >60)
GFR calc non Af Amer: >60 mL/min (ref >60)
Glucose, Bld: 100 mg/dL — ABNORMAL HIGH (ref 70–99)
Potassium: 3.1 mmol/L — ABNORMAL LOW (ref 3.5–5.1)
SODIUM: 140 mmol/L (ref 135–145)
Total Bilirubin: 0.4 mg/dL (ref 0.3–1.2)
Total Protein: 6.1 g/dL — ABNORMAL LOW (ref 6.5–8.1)

## 2018-04-22 MED ORDER — PROMETHAZINE HCL 25 MG/ML IJ SOLN
12.5000 mg | Freq: Four times a day (QID) | INTRAMUSCULAR | Status: DC | PRN
  Administered 2018-04-22: 12.5 mg via INTRAVENOUS
  Filled 2018-04-22: qty 1

## 2018-04-22 MED ORDER — FLEET ENEMA 7-19 GM/118ML RE ENEM
1.0000 | ENEMA | Freq: Once | RECTAL | Status: AC
  Administered 2018-04-22: 1 via RECTAL

## 2018-04-22 MED ORDER — ACETAMINOPHEN 10 MG/ML IV SOLN
1000.0000 mg | Freq: Four times a day (QID) | INTRAVENOUS | Status: AC
  Administered 2018-04-22 – 2018-04-23 (×4): 1000 mg via INTRAVENOUS
  Filled 2018-04-22 (×4): qty 100

## 2018-04-22 MED ORDER — ACETAMINOPHEN 10 MG/ML IV SOLN: 1000.0000 mg | Freq: Four times a day (QID) | INTRAVENOUS | Status: DC

## 2018-04-22 MED ORDER — PROMETHAZINE HCL 25 MG PO TABS
25.0000 mg | ORAL_TABLET | Freq: Four times a day (QID) | ORAL | Status: DC | PRN
  Filled 2018-04-22: qty 1

## 2018-04-22 NOTE — Progress Notes (Signed)
[facility] Progress Note     Subjective: She is resting in bed this morning. She complains of "pain all over." Emesis yesterday, no nausea this morning. NGT in place, NPO. Has not been up and moving.   Objective: Vital signs in last 24 hours: Temp:  [98.1 F (36.7 C)-98.4 F (36.9 C)] 98.2 F (36.8 C) (10/07 0747) Pulse Rate:  [68-85] 85 (10/07 0747) Resp:  [18] 18 (10/07 0747) BP: (102-181)/(68-88) 102/68 (10/07 0747) SpO2:  [98 %-100 %] 98 % (10/07 0747) Last BM Date: 04/14/18  Intake/Output from previous day: 10/06 0701 - 10/07 0700 In: 1250 [I.V.:1250] Out: 2350 [Urine:700; Emesis/NG output:1650] Intake/Output this shift: No intake/output data recorded.  PE: Gen:  Alert, NAD, pleasant Card:  Regular rate and rhythm Pulm:  Normal effort, clear to auscultation bilaterally Abd: Soft, diffuse tenderness, non-distended, tympanic to percussion Skin: warm and dry, no rashes  Psych: A&Ox3   Lab Results:  Recent Labs    04/20/18 1629 04/21/18 0943  WBC 6.4 5.9  HGB 13.0 12.0  HCT 39.7 36.9  PLT 380 318   BMET Recent Labs    04/20/18 1932 04/21/18 0943  NA 138 138  K 3.4* 3.5  CL 102 100  CO2 27 28  GLUCOSE 93 108*  BUN 11 9  CREATININE 0.67 0.71  CALCIUM 8.3* 8.4*   PT/INR No results for input(s): LABPROT, INR in the last 72 hours. CMP     Component Value Date/Time   NA 138 04/21/2018 0943   K 3.5 04/21/2018 0943   CL 100 04/21/2018 0943   CO2 28 04/21/2018 0943   GLUCOSE 108 (H) 04/21/2018 0943   BUN 9 04/21/2018 0943   CREATININE 0.71 04/21/2018 0943   CALCIUM 8.4 (L) 04/21/2018 0943   PROT 6.3 (L) 04/21/2018 0943   ALBUMIN 2.8 (L) 04/21/2018 0943   AST 11 (L) 04/21/2018 0943   ALT 7 04/21/2018 0943   ALKPHOS 37 (L) 04/21/2018 0943   BILITOT 0.4 04/21/2018 0943   GFRNONAA >60 04/21/2018 0943   GFRAA >60 04/21/2018 0943   Lipase     Component Value Date/Time   LIPASE 19 04/20/2018 1932       Studies/Results: Dg  Abdomen 1 View  Result Date: 04/20/2018 CLINICAL DATA:  37 y/o  F; NG tube placement. EXAM: ABDOMEN - 1 VIEW COMPARISON:  04/20/2018 CT abdomen and pelvis. FINDINGS: Diffusely dilated loops of bowel project over the upper abdomen better characterized on prior CT of abdomen and pelvis. The enteric tube tip projects over proximal stomach and the proximal side hole is in the distal esophagus IMPRESSION: Enteric tube tip projects over proximal stomach and the proximal side hole is in the distal esophagus. At least 6 cm advancement recommended. Electronically Signed   By: Kristine Garbe M.D.   On: 04/20/2018 23:02   Ct Abdomen Pelvis W Contrast  Result Date: 04/20/2018 CLINICAL DATA:  Nausea and vomiting. Status post hysterectomy on 04/15/2018. EXAM: CT ABDOMEN AND PELVIS WITH CONTRAST TECHNIQUE: Multidetector CT imaging of the abdomen and pelvis was performed using the standard protocol following bolus administration of intravenous contrast. CONTRAST:  158mL ISOVUE-300 IOPAMIDOL (ISOVUE-300) INJECTION 61% COMPARISON:  02/14/2018. FINDINGS: Lower chest: Unremarkable. Hepatobiliary: Tiny 3 mm low-density lesion in the right liver is too small to characterize but likely benign and probably a cyst. Liver otherwise unremarkable. Gallbladder surgically absent. No intrahepatic or extrahepatic biliary dilation. Pancreas: No focal mass lesion. No dilatation of the main duct. No intraparenchymal cyst. No peripancreatic  edema. Spleen: No splenomegaly. No focal mass lesion. Adrenals/Urinary Tract: No adrenal nodule or mass. Right kidney unremarkable. The areas of segmental hypoperfusion are identified in the left kidney suspicious for pyelonephritis. No evidence for hydroureter. The urinary bladder appears normal for the degree of distention. Stomach/Bowel: Stomach is fluid-filled and distended. Duodenum is fluid-filled and distended. Small bowel loops in the abdomen and upper pelvis are fluid-filled and distended  measuring up to 3.9 cm diameter. Gas is identified between small bowel contents and the mucosa, non dependent in some locations, but this is not felt to represent pneumatosis. Small bowel loops in the central pelvis are relatively decompressed. Fluid and gas are identified in the terminal ileum although it is nondilated. There is fluid in the mildly distended right colon with gas and stool identified in the transverse and left segments of the colon. Vascular/Lymphatic: No abdominal aortic aneurysm. No abdominal lymphadenopathy no pelvic lymphadenopathy Reproductive: Uterus surgically absent. A 3.1 x 3.2 cm multicystic focus in the posterior right pelvis (61/7) may be the ovary. This could also be a tiny residual hematoma or fluid collection from surgery. Other: Small volume free fluid noted in the pelvis. Small volume of fluid is also noted in each paracolic gutter. Musculoskeletal: Gas is identified in the subcutaneous fat of the lower anterior abdominal wall and a tiny gas bubble is identified in the right rectus sheath. This is not unexpected on postoperative day 5. no evidence for intraperitoneal free air. IMPRESSION: 1. Prominent fluid-filled distention of proximal and mid small bowel which appears to gradually taper to decompressed loops in the central pelvis. Gas and fluid is identified in the nondilated terminal ileum and fluid/gas/stool is identified along the length of the colon. Imaging features may be related to a severe adynamic ileus given the patient is only 5 days out from surgery. Although small bowel obstruction is considered less likely based on timing since surgery, mechanical obstruction cannot be excluded on this CT. 2. 3 cm multicystic structure identified in the right pelvis, not well defined given the adjacent collapsed small bowel loops. This may represent the ovary although a residual fluid collection from recent surgery could have this appearance. 3. Segmental edema/hypoperfusion in the  left kidney is highly suspicious for pyelonephritis. 4. Small volume free fluid identified in the para colic gutters and central pelvis. 5. Soft tissue gas in the anterior lower abdominal wall, in the expected region of a low transverse incision and not unexpected on postoperative day 5. There is no evidence for intraperitoneal free gas. Electronically Signed   By: Misty Stanley M.D.   On: 04/20/2018 21:14   Dg Abd Portable 2v  Result Date: 04/21/2018 CLINICAL DATA:  Small bowel obstruction, recent hysterectomy EXAM: PORTABLE ABDOMEN - 2 VIEW COMPARISON:  04/20/2018 abdominal radiograph FINDINGS: Enteric tube terminates in the proximal stomach. Moderately dilated small bowel loops throughout the abdomen, not appreciably changed. No evidence of pneumatosis or pneumoperitoneum. Excreted contrast in the bladder. Clear lung bases. No radiopaque nephrolithiasis. IMPRESSION: Enteric tube terminates in the proximal stomach. Stable moderate small bowel dilatation throughout the abdomen compatible with diffuse adynamic ileus or distal small bowel obstruction. Electronically Signed   By: Ilona Sorrel M.D.   On: 04/21/2018 10:12    Anti-infectives: Anti-infectives (From admission, onward)   Start     Dose/Rate Route Frequency Ordered Stop   04/20/18 2245  levofloxacin (LEVAQUIN) IVPB 500 mg  Status:  Discontinued     500 mg 100 mL/hr over 60 Minutes Intravenous Every 24  hours 04/20/18 2235 04/21/18 2057       Assessment/Plan  Post-Operative Ileus  - NGT placed on day of admission, per chart review 1600cc out in last 24 hours.  - Will obtain KUB this morning - Likely NGT will remain in place and she will remain NPO today - Continue serial abdominal exams  - She is complaining of inadequate pain control, takes percocet and flexeril at home for arthritis and muscles spasms....would limit narcotic use given ileus - Encourage mobilization - OB/GYN primary   LOS: 1 day    Edison Simon ,  PA-C  [facility] 04/22/2018, 9:06 AM [phone] M-F: 7am - 4pm

## 2018-04-22 NOTE — Progress Notes (Signed)
Post-Operative Day #7 s/p TAH with bilateral salpingectomy/Hospital Day # 2, s/p post-operative ileus. H/o chronic pain, long-time narcotic use, substance abuse.  Subjective: Patient complains of feeling "awful".  Notes that she is going through withdrawals, feels clammy having hot flushes.  Also feels nauseated.  Notes that she just wants to leave as she wants to see her kids. Also desiring to have an enema as she has not had a BM since leaving the hospital previously, and has a h/o chronic constipation due to chronic pain medication use. States that she does not feel her pain is very well controlled.   Objective: Temp:  [98.1 F (36.7 C)-98.4 F (36.9 C)] 98.2 F (36.8 C) (10/07 0747) Pulse Rate:  [68-85] 85 (10/07 0747) Resp:  [18] 18 (10/07 0747) BP: (102-181)/(68-88) 102/68 (10/07 0747) SpO2:  [98 %-100 %] 98 % (10/07 0747)  I/O last 3 completed shifts: In: 3250 [I.V.:2250; IV Piggyback:1000] Out: 8325 [Urine:1150; Emesis/NG output:3125] Total I/O In: 159.1 [I.V.:159.1] Out: 250 [Emesis/NG output:250]    Physical Exam:  General: alert and no distress  HEENT: NG tube in place draining bilious fluid Lungs: clear to auscultation bilaterally  Heart: regular rate and rhythm, S1, S2 normal, no murmur, click, rub or gallop Abdomen: deferred at patient's request Pelvis:Bleeding: no bleeding,  Incision: healing well, no significant drainage, no dehiscence, no significant erythema Extremities: DVT Evaluation: No evidence of DVT seen on physical exam. Negative Homan's sign. No cords or calf tenderness. No significant calf/ankle edema.   Labs:   Lab Results  Component Value Date   WBC 5.9 04/21/2018   HGB 12.0 04/21/2018   HCT 36.9 04/21/2018   MCV 91.3 04/21/2018   PLT 318 04/21/2018    Lab Results  Component Value Date   CREATININE 0.71 04/21/2018   BUN 9 04/21/2018   NA 138 04/21/2018   K 3.5 04/21/2018   CL 100 04/21/2018   CO2 28 04/21/2018   Lab Results   Component Value Date   ALT 7 04/21/2018   AST 11 (L) 04/21/2018   ALKPHOS 37 (L) 04/21/2018   BILITOT 0.4 04/21/2018     Assessment/Plan: Post-operative Day #7 s/p hysterectomy, HD#2 readmitted for post-operative ileus.  - Chronic prescription  narcotic use - patient currently without pain medication x 2 days, treating withdrawal symptoms with Ativan. Discussed that it was necessary to discontinue her narcotic medication use as it was likely the cause of her ileus (received multiple medications during recent hospitalization for pain control). Currently on Toradol IV, and will add IV Tylenol.  - Post-operative ileus - currently with NG tube in place.  General Surgery aware of patient and currently following. Will repeat Abd X-Ray in a.m. Possibly may pull NG tube tomorrow if drainage continues to decrease. Continue NPO status for now, continue IVF with D5LR. Repeat CMP today.  - Chronic constipation - patient notes that she uses laxatives every few days. Would like to have an enema today. Will order.  - Continue routine post-operative care    Rubie Maid, MD [facility]

## 2018-04-22 NOTE — Progress Notes (Signed)
Came into room to administer medications.  Patient was hiding face under blanket.  Asked o see her face.  Patient has pulled her NG tube and eaten 2 chicken wings, some collard greens and mac n cheese.

## 2018-04-23 ENCOUNTER — Inpatient Hospital Stay: Payer: Medicaid Other

## 2018-04-23 ENCOUNTER — Telehealth: Payer: Self-pay | Admitting: Obstetrics and Gynecology

## 2018-04-23 DIAGNOSIS — K567 Ileus, unspecified: Secondary | ICD-10-CM | POA: Diagnosis not present

## 2018-04-23 DIAGNOSIS — K9189 Other postprocedural complications and disorders of digestive system: Secondary | ICD-10-CM | POA: Diagnosis not present

## 2018-04-23 MED ORDER — POLYETHYLENE GLYCOL 3350 17 G PO PACK: 17.0000 g | PACK | Freq: Every day | ORAL | 3 refills | Status: DC

## 2018-04-23 MED ORDER — LISINOPRIL-HYDROCHLOROTHIAZIDE 10-12.5 MG PO TABS: 1.0000 | ORAL_TABLET | Freq: Every day | ORAL | 6 refills | Status: DC

## 2018-04-23 MED ORDER — HYDROCHLOROTHIAZIDE 12.5 MG PO CAPS
12.5000 mg | ORAL_CAPSULE | Freq: Every day | ORAL | Status: DC
  Administered 2018-04-23: 12.5 mg via ORAL
  Filled 2018-04-23: qty 1

## 2018-04-23 MED ORDER — LISINOPRIL-HYDROCHLOROTHIAZIDE 10-12.5 MG PO TABS: 2.0000 | ORAL_TABLET | Freq: Every day | ORAL | 6 refills | Status: DC

## 2018-04-23 MED ORDER — LISINOPRIL 10 MG PO TABS
10.0000 mg | ORAL_TABLET | Freq: Every day | ORAL | Status: DC
  Administered 2018-04-23: 10 mg via ORAL
  Filled 2018-04-23: qty 1

## 2018-04-23 MED ORDER — POTASSIUM CHLORIDE CRYS ER 20 MEQ PO TBCR
40.0000 meq | EXTENDED_RELEASE_TABLET | Freq: Once | ORAL | Status: AC
  Administered 2018-04-23: 40 meq via ORAL
  Filled 2018-04-23: qty 2

## 2018-04-23 NOTE — Telephone Encounter (Signed)
[facility] called just now with courtesy results and also wanted to verbally notify Dr. Marcelline Mates that this patient does have a partial SBO and is going to fax over a copy of those records today.  The patient is currently admitted to [facility] and had her radiology report done today; early this morning, please advise, thanks.

## 2018-04-23 NOTE — Discharge Instructions (Signed)
Ileus  Ileus is a condition in which the intestines, also called the bowels, stop working and moving correctly. If the intestines stop working, food cannot pass through to get digested. The intestines are hollow organs that digest food after the food leaves the stomach. These organs are long, muscular tubes that connect the stomach to the rectum. When ileus occurs, the muscular contractions that cause food to move through the intestines stop happening as they normally would.  Ileus can occur for various reasons. This condition is a serious problem that usually requires hospitalization. It can cause symptoms such as nausea, abdominal pain, and bloating. Ileus can last from a few hours to a few days. If the intestines stop working because of a blockage, that is a different condition that is called a bowel obstruction.  What are the causes?  This condition may be caused by:  · Surgery on the abdomen.  · An infection or inflammation in the abdomen. This includes inflammation of the lining of the abdomen (peritonitis).  · Infection or inflammation in other parts of the body, such as pneumonia or pancreatitis.  · Passage of gallstones or kidney stones.  · Damage to the nerves or blood vessels that go to the intestines.  · A collection of blood within the abdominal cavity.  · Imbalance in the salts in the blood (electrolytes).  · Injury to the brain or spinal cord.  · Medicines. Many medicines, including strong pain medicines, can cause ileus or make it worse.    What are the signs or symptoms?  Symptoms of this condition include:  · Bloating of the abdomen.  · Pain or discomfort in the abdomen.  · Poor appetite.  · Nausea and vomiting.  · Lack of normal bowel sounds, such as “growling" in the stomach.    How is this diagnosed?  This condition may be diagnosed with:  · A physical exam and medical history.  · X-rays or a CT scan of the abdomen.    You may also have other tests to help find the cause of the condition.  How  is this treated?  Treatment for this condition may include:  · Resting the intestines until they start to work again. This is often done by:  ? Stopping oral intake of food and drink. You will be given fluid through an IV tube to prevent dehydration.  ? Placing a small tube (nasogastric tube or NG tube) that is passed through your nose and into your stomach. The tube is attached to a suction device and keeps the stomach emptied out. This allows the bowels to rest and also helps to reduce nausea and vomiting.  · Correcting any electrolyte imbalance by giving supplements in the IV fluid.  · Stopping any medicines that might make ileus worse.  · Treating any condition that may have caused ileus.    Follow these instructions at home:  · Follow instructions from your health care provider about diet and fluid intake. Usually, you will be told to:  ? Drink plenty of clear fluids.  ? Avoid alcohol.  ? Avoid caffeine.  ? Eat a bland diet.  · Get plenty of rest. Return to your normal activities as told by your health care provider.  · Take over-the-counter and prescription medicines only as told by your health care provider.  · Keep all follow-up visits as told by your health care provider. This is important.  Contact a health care provider if:  · You have nausea, vomiting,   or abdominal discomfort.  · You have a fever.  Get help right away if:  · You have severe abdominal pain or bloating.  · You cannot eat or drink without vomiting.  This information is not intended to replace advice given to you by your health care provider. Make sure you discuss any questions you have with your health care provider.  Document Released: 07/06/2003 Document Revised: 12/09/2015 Document Reviewed: 08/27/2014  [employer] Interactive Patient Education © 2018 [employer]

## 2018-04-23 NOTE — Progress Notes (Signed)
Post-Operative Day #8 s/p TAH with bilateral salpingectomy/Hospital Day # 3, s/p post-operative ileus. H/o chronic pain, long-time narcotic use, substance abuse, HTN.  Subjective: Overnight patient pulled NG tube out, and consumed a full meal against medical advice. Patient notes she is doing much bettertoday.  Is tolerating regular diet, ambulating without difficulty. Has also had a full bowel movement. Denies any major complaints of pain  Objective: Vitals:   04/22/18 2015 04/22/18 2346 04/23/18 0341 04/23/18 0700  BP: (!) 185/85 (!) 182/95 (!) 167/89 (!) 155/85  Pulse: 83 73 61 63  Resp: 18 18 18 16   Temp: 98.4 F (36.9 C) 98.2 F (36.8 C) 98 F (36.7 C) 98.4 F (36.9 C)  TempSrc: Oral Oral Oral Oral  SpO2: 99% 99% 97% 98%  Weight:      Height:        I/O last 3 completed shifts: In: 3051.8 [I.V.:2951.8; IV Piggyback:100] Out: 1100 [Urine:250; Emesis/NG output:850] No intake/output data recorded.   Physical Exam:  General: alert and no distress  HEENT: NG tube in place draining bilious fluid Lungs: clear to auscultation bilaterally  Heart: regular rate and rhythm, S1, S2 normal, no murmur, click, rub or gallop Abdomen: deferred at patient's request Pelvis:Bleeding: no bleeding,  Incision: healing well, no significant drainage, no dehiscence, no significant erythema Extremities: DVT Evaluation: No evidence of DVT seen on physical exam. Negative Homan's sign. No cords or calf tenderness. No significant calf/ankle edema.   Labs:   Lab Results  Component Value Date   WBC 5.9 04/21/2018   HGB 12.0 04/21/2018   HCT 36.9 04/21/2018   MCV 91.3 04/21/2018   PLT 318 04/21/2018    Lab Results  Component Value Date   CREATININE 0.78 04/22/2018   BUN 6 04/22/2018   NA 140 04/22/2018   K 3.1 (L) 04/22/2018   CL 106 04/22/2018   CO2 27 04/22/2018   Lab Results  Component Value Date   ALT 8 04/22/2018   AST 13 (L) 04/22/2018   ALKPHOS 38 04/22/2018   BILITOT 0.4  04/22/2018     DG Abd 2 Views CLINICAL DATA:  37 year old female. Postoperative ileus. Hysterectomy 04/15/2018. Subsequent encounter.  EXAM: ABDOMEN - 2 VIEW  COMPARISON:  04/22/2018 plain film exam.  04/20/2018 CT.  FINDINGS: Progressive gas distended small bowel loops with thickened folds with small bowel measuring up to 5.6 cm versus prior 5 cm. Small bowel fold thickening more notable. Gas seen within portions of colon. No free intraperitoneal air detected. Findings raise possibility of partial small bowel obstruction with postoperative ileus a secondary consideration.  IMPRESSION: Progressive abnormal bowel gas pattern raises possibility of partial small bowel obstruction with postoperative ileus a secondary consideration. Please see above.  These results will be called to the ordering clinician or representative by the Radiologist Assistant, and communication documented in the PACS or zVision Dashboard.  Electronically Signed   By: Genia Del M.D.   On: 04/23/2018 09:39  Assessment/Plan: Post-operative Day #8 s/p hysterectomy, HD#3 readmitted for post-operative ileus.  - Post-operative ileus - clinically significantly improved, although abdominal X-ray still noting evidence of possible partial SBO vs ileus.  General Surgery has seen patient and recommend no further intervention at this time, and is ok to d/c home with precautions.  - Chronic prescription  narcotic use - patient currently without pain medication x 3 days, treating withdrawal symptoms with Ativan. Discussed if patient desired to continue with detoxification, can continue prescription for Ativan for the next 4-7 days.  Patient notes that now that she has had her hysterectomy, she will only need her pain medications "as needed, every few days" for her chronic knee pain.  - Chronic constipation - patient notes that she uses laxatives every few days. Advised on use of Miralax to improve regularity. Will  prescribe.  -  Mild hypokalemia. Will treat PO with K-dur now that she is tolerating PO, and refused IV infusion.  - Hypertension - patient has not received her BP meds in several days due to NPO status with NG tube.  Will give today as is currently uncontrolled.   Can d/c home today. To f/u on Friday for GYN post-operative check.      Rubie Maid, MD [facility]

## 2018-04-23 NOTE — Discharge Summary (Signed)
Gynecology Physician Postoperative Discharge Summary  Patient ID: Sarah Sosa MRN: [id number] DOB/AGE: 11/02/1980 37 y.o.  Admit Date: 04/20/2018 Discharge Date: 04/23/2018  Preoperative Diagnoses: Post-operative ileus, h/o chronic pain, substance abuse. Recent abdominal hysterectomy  Procedures: Imaging (serial abdominal X-rays)  Hospital Course:  Sarah Sosa is a 37 y.o. female readmitted on POD#5 s/p TAH with bilateral salpingectomy for nausea/vomiting, abdominal pain. She was diagnosed with ileus vs SBO.  She had an NG tube placed and remained NPO x 2 days. She began experiencing withdrawal symptoms and was treated with Ativan.  Patient self-discontinued her NG tube and proceeded to tolerate a regular diet. By time of discharge on POD#8, HD#3,  she was ambulating, voiding without difficulty, tolerating regular diet and passing flatus and had a BM. She was deemed stable for discharge to home.   Significant Labs: CBC Latest Ref Rng & Units 04/21/2018 04/20/2018 04/16/2018  WBC 3.6 - 11.0 K/uL 5.9 6.4 15.7(H)  Hemoglobin 12.0 - 16.0 g/dL 12.0 13.0 12.4  Hematocrit 35.0 - 47.0 % 36.9 39.7 36.9  Platelets 150 - 440 K/uL 318 380 284    Discharge Exam: Blood pressure (!) 155/85, pulse 63, temperature 98.4 F (36.9 C), temperature source Oral, resp. rate 16, height 5\' 6"  (1.676 m), weight 90 kg, last menstrual period 03/25/2018, SpO2 98 %. General appearance: alert and no distress  Resp: clear to auscultation bilaterally  Cardio: regular rate and rhythm  GI: soft, non-tender; bowel sounds normal; no masses, no organomegaly.  Incision: C/D/I, no erythema, no drainage noted Pelvic: no bleeding Extremities: extremities normal, atraumatic, no cyanosis or edema and Homans sign is negative, no sign of DVT  Discharged Condition: Stable  Disposition: Discharge disposition: 01-Home or Self Care      Discharge Instructions    Discharge patient   Complete by:  As directed    Discharge  disposition:  01-Home or Self Care   Discharge patient date:  04/23/2018     Allergies as of 04/23/2018      Reactions   Latex Anaphylaxis, Hives, Itching   Nsaids Other (See Comments)   Serve ulcers; stomach aches, GERD.    Tomato Anaphylaxis, Hives, Itching   Tramadol Itching      Medication List    STOP taking these medications   acetaminophen 500 MG tablet Commonly known as:  TYLENOL   docusate sodium 100 MG capsule Commonly known as:  COLACE   fluticasone 50 MCG/ACT nasal spray Commonly known as:  FLONASE   ketorolac 10 MG tablet Commonly known as:  TORADOL   oxyCODONE 5 MG immediate release tablet Commonly known as:  Oxy IR/ROXICODONE     TAKE these medications   ALPRAZolam 0.25 MG tablet Commonly known as:  XANAX Take 0.25 mg by mouth 2 (two) times daily.   cyclobenzaprine 10 MG tablet Commonly known as:  FLEXERIL Take 1 tablet (10 mg total) by mouth 3 (three) times daily as needed for muscle spasms.   diphenhydramine-acetaminophen 25-500 MG Tabs tablet Commonly known as:  TYLENOL PM Take 2 tablets by mouth at bedtime as needed (sleep).   gabapentin 300 MG capsule Commonly known as:  NEURONTIN Take 1 capsule (300 mg total) by mouth 3 (three) times daily for 7 days. For nerve pain after surgery   lisinopril-hydrochlorothiazide 10-12.5 MG tablet Commonly known as:  PRINZIDE,ZESTORETIC Take 1 tablet by mouth daily.   oxyCODONE-acetaminophen 7.5-325 MG tablet Commonly known as:  PERCOCET Take 1 tablet by mouth 4 (four) times daily.   polyethylene glycol  packet Commonly known as:  MIRALAX / GLYCOLAX Take 17 g by mouth daily. Take daily for 2 weeks, then daily prn.      Future Appointments  Date Time Provider [place]  04/26/2018 11:00 AM Rubie Maid, MD [facility] None     Signed:  Rubie Maid, MD [facility]

## 2018-04-23 NOTE — Progress Notes (Signed)
   04/23/18 1130  Clinical Encounter Type  Visited With Patient not available;Health care provider  Visit Type Follow-up  Spiritual Encounters  Spiritual Needs Prayer   Chaplain attempted follow up with patient.  Per staff, patient currently with physician.  Chaplain offered silent and energetic prayer for patient and care team.

## 2018-04-23 NOTE — Progress Notes (Addendum)
SURGICAL PROGRESS NOTE (cpt: [phone])  Patient seen and examined as described below by surgical PA-C, Ardell Isaacs.  Assessment/Plan: (ICD-10's: K56.7, K91.89) In summary, patient is a 37 y.o. female with resolving post-operative ileus POD#9 s/p hysterectomy with Dr. Marcelline Mates for fibroids, complicated by pertinent comorbidities including HTN, asthma, history of heart murmur, chronic pain, and chronic ongoing tobacco abuse (smoking).   - minimize narcotics  - okay to keep NGT out as patient tolerating diet without abdominal pain or N/V with +flatus and +BM's  - medical management and discharge planning as per primary team, return precautions discussed  - will signoff, please call if any questions or concerns  - DVT prophylaxis, ambulation encouraged.  Thank you for the opportunity to participate in this patient's care.  -- Marilynne Drivers Rosana Hoes, MD, [place]: [facility] - Partnering for exceptional care. Office: [phone]      SURGICAL PROGRESS NOTE (cpt [phone])  Hospital Day(s): 2.   Post op day(s):  Marland Kitchen   Interval History: Patient seen and examined, patient removed her NGT last night and ate a full meal (chicken, greens, and mac and cheese). Denied any nausea or emesis this morning. No abdominal pain. She endorses 6 bowel movements since evaluation yesterday morning.  Review of Systems:  Constitutional: denies fever, chills  HEENT: denies cough or congestion  Respiratory: denies any shortness of breath  Cardiovascular: denies chest pain or palpitations  Gastrointestinal: denies abdominal pain, N/V, or diarrhea/and bowel function as per interval history Genitourinary: denies burning with urination or urinary frequency Musculoskeletal: denies pain, decreased motor or sensation Integumentary: denies any other rashes or skin discolorations Neurological: denies HA or vision/hearing changes   Vital signs in last 24 hours: [min-max] current  Temp:   [98 F (36.7 C)-98.4 F (36.9 C)] 98.4 F (36.9 C) (10/08 0700) Pulse Rate:  [61-83] 63 (10/08 0700) Resp:  [16-18] 16 (10/08 0700) BP: (155-185)/(85-95) 155/85 (10/08 0700) SpO2:  [97 %-99 %] 98 % (10/08 0700)     Height: _0  (167.6 cm) Weight: 90 kg BMI (Calculated): 32.05   Intake/Output this shift:  No intake/output data recorded.   Intake/Output last 2 shifts:  _1 @   Physical Exam:  Constitutional: alert, cooperative and no distress  HENT: normocephalic without obvious abnormality  Eyes: PERRL, EOM's grossly intact and symmetric  Neuro: CN II - XII grossly intact and symmetric without deficit  Respiratory: breathing non-labored at rest  Cardiovascular: regular rate and sinus rhythm  Gastrointestinal: soft, non-tender, and non-distended, + BS Musculoskeletal: UE and LE FROM, no edema or wounds, motor and sensation grossly intact, NT   Labs:  CBC Latest Ref Rng & Units 04/21/2018 04/20/2018 04/16/2018  WBC 3.6 - 11.0 K/uL 5.9 6.4 15.7(H)  Hemoglobin 12.0 - 16.0 g/dL 12.0 13.0 12.4  Hematocrit 35.0 - 47.0 % 36.9 39.7 36.9  Platelets 150 - 440 K/uL 318 380 284   CMP Latest Ref Rng & Units 04/22/2018 04/21/2018 04/20/2018  Glucose 70 - 99 mg/dL 100(H) 108(H) 93  BUN 6 - 20 mg/dL _2 Creatinine 0.44 - 1.00 mg/dL 0.78 0.71 0.67  Sodium 135 - 145 mmol/L 140 138 138  Potassium 3.5 - 5.1 mmol/L 3.1(L) 3.5 3.4(L)  Chloride 98 - 111 mmol/L 106 100 102  CO2 22 - 32 mmol/L _3 Calcium 8.9 - 10.3 mg/dL 8.5(L) 8.4(L) 8.3(L)  Total Protein 6.5 - 8.1 g/dL 6.1(L) 6.3(L) 6.5  Total Bilirubin 0.3 - 1.2 mg/dL 0.4 0.4 0.5  Alkaline Phos 38 - 126 U/L 38 37(L) 43  AST 15 - 41 U/L 13(L) 11(L) 11(L)  ALT 0 - 44 U/L _0 Imaging studies: No new pertinent imaging studies   Assessment/Plan: (ICD-10's: K56.7, K91.89) 37 y.o. female with resolving post-operative ileus POD9 s/p hysterectomy with Dr. Marcelline Mates for fibroids, complicated by pertinent comorbidities including  Asthma, chronic pain, history of heart murmur, HTN and current tobacco abuse (smoking).   - minimize narcotics  - okay to keep NGT out as patient tolerating diet without abdominal pain or N/V with +flatus and +BM's  - medical management and discharge planning as per primary team, return precautions discussed  - will signoff, please call if any questions or concerns  - DVT prophylaxis, ambulation encouraged  All of the above findings and recommendations were discussed with the patient and Dr. Patrice Paradise team, and all of patient's questions were answered to her expressed satisfaction.  Thank you for the opportunity to participate in this patient's care.  -- Edison Simon, PA-C [facility] 04/23/2018, 10:47 AM [phone] M-F: 7am - 4pm

## 2018-04-23 NOTE — Progress Notes (Signed)
Patient off the floor for imaging (abdominal X-ray).  Will return to round once patient returns. Of note, called by nurses yesterday evening as patient had pulled NG tube out and ate a full meal (fried chicken, collard greens, mac 'n cheese) brought in by visitor.  Was initiated on liquid diet thereafter. Per nurse, no episodes of vomiting overnight. If imaging ok today, can discuss with General Surgery regarding potential discharge today.   Rubie Maid, MD [facility]

## 2018-04-23 NOTE — Progress Notes (Signed)
Patient discharged home. Discharge instructions, prescriptions and follow up appointment given to and reviewed with patient. Patient verbalized understanding. Patient wheeled out by auxiliary.  

## 2018-04-24 NOTE — Telephone Encounter (Signed)
AC was informed yesterday and pt was discharged.

## 2018-04-26 ENCOUNTER — Other Ambulatory Visit: Payer: Self-pay

## 2018-04-26 ENCOUNTER — Encounter: Payer: Medicaid Other | Admitting: Obstetrics and Gynecology

## 2018-04-26 NOTE — Telephone Encounter (Signed)
Pt was called to see how she was doing since she did not arrive for her appointment today. No answer but let message via voicemail of the reason for the call and for her to call the office to inform staff on how she was doing.

## 2018-06-24 ENCOUNTER — Telehealth: Payer: Self-pay

## 2018-06-24 NOTE — Telephone Encounter (Signed)
Spoke with pt concerning her hysterectomy statement paper being rejected by medicaid due to the signature. Pt seem to not understand why due to that is the way she always sign. Pt stated that she would come by on tomorrow and sign the paper. Pt stated that one of her incision area is numb and she as no feeling in that area. Pt was advise to make an appointment to be seen by [facility]. Pt stated that she would make an appointment when she came in to sign the paperwork.

## 2018-07-15 ENCOUNTER — Encounter: Payer: Self-pay | Admitting: Radiology

## 2018-07-15 ENCOUNTER — Emergency Department
Admission: EM | Admit: 2018-07-15 | Discharge: 2018-07-15 | Disposition: A | Discharge status: Home / Self Care | Payer: Medicaid Other | Attending: Student in an Organized Health Care Education/Training Program | Admitting: Student in an Organized Health Care Education/Training Program

## 2018-07-15 ENCOUNTER — Other Ambulatory Visit: Payer: Self-pay

## 2018-07-15 ENCOUNTER — Emergency Department: Payer: Medicaid Other

## 2018-07-15 DIAGNOSIS — F1210 Cannabis abuse, uncomplicated: Secondary | ICD-10-CM | POA: Diagnosis not present

## 2018-07-15 DIAGNOSIS — R112 Nausea with vomiting, unspecified: Secondary | ICD-10-CM | POA: Diagnosis not present

## 2018-07-15 DIAGNOSIS — J45909 Unspecified asthma, uncomplicated: Secondary | ICD-10-CM | POA: Insufficient documentation

## 2018-07-15 DIAGNOSIS — Z91040 Latex allergy status: Secondary | ICD-10-CM | POA: Diagnosis not present

## 2018-07-15 DIAGNOSIS — F17210 Nicotine dependence, cigarettes, uncomplicated: Secondary | ICD-10-CM | POA: Insufficient documentation

## 2018-07-15 DIAGNOSIS — Z79899 Other long term (current) drug therapy: Secondary | ICD-10-CM | POA: Insufficient documentation

## 2018-07-15 DIAGNOSIS — I10 Essential (primary) hypertension: Secondary | ICD-10-CM | POA: Insufficient documentation

## 2018-07-15 DIAGNOSIS — R1030 Lower abdominal pain, unspecified: Secondary | ICD-10-CM | POA: Diagnosis present

## 2018-07-15 LAB — COMPREHENSIVE METABOLIC PANEL
ALBUMIN: 3.8 g/dL (ref 3.5–5.0)
ALT: 9 U/L (ref 0–44)
AST: 14 U/L — ABNORMAL LOW (ref 15–41)
Alkaline Phosphatase: 49 U/L (ref 38–126)
Anion gap: 7 (ref 5–15)
BUN: 10 mg/dL (ref 6–20)
CO2: 24 mmol/L (ref 22–32)
Calcium: 9.2 mg/dL (ref 8.9–10.3)
Chloride: 109 mmol/L (ref 98–111)
Creatinine, Ser: 1.14 mg/dL — ABNORMAL HIGH (ref 0.44–1.00)
GFR calc Af Amer: >60 mL/min (ref >60)
GFR calc non Af Amer: >60 mL/min (ref >60)
Glucose, Bld: 120 mg/dL — ABNORMAL HIGH (ref 70–99)
Potassium: 3.4 mmol/L — ABNORMAL LOW (ref 3.5–5.1)
Sodium: 140 mmol/L (ref 135–145)
Total Bilirubin: 0.8 mg/dL (ref 0.3–1.2)
Total Protein: 7.3 g/dL (ref 6.5–8.1)

## 2018-07-15 LAB — URINALYSIS, COMPLETE (UACMP) WITH MICROSCOPIC
Bacteria, UA: NONE SEEN
Bilirubin Urine: NEGATIVE
GLUCOSE, UA: NEGATIVE mg/dL
Hgb urine dipstick: NEGATIVE
Ketones, ur: NEGATIVE mg/dL
Leukocytes, UA: NEGATIVE
Nitrite: NEGATIVE
Protein, ur: 100 mg/dL — AB
Specific Gravity, Urine: 1.026 (ref 1.005–1.030)
pH: 8 (ref 5.0–8.0)

## 2018-07-15 LAB — CBC
HCT: 39.8 % (ref 36.0–46.0)
Hemoglobin: 13.1 g/dL (ref 12.0–15.0)
MCH: 29.4 pg (ref 26.0–34.0)
MCHC: 32.9 g/dL (ref 30.0–36.0)
MCV: 89.4 fL (ref 80.0–100.0)
Platelets: 345 10*3/uL (ref 150–400)
RBC: 4.45 MIL/uL (ref 3.87–5.11)
RDW: 13.3 % (ref 11.5–15.5)
WBC: 5.4 10*3/uL (ref 4.0–10.5)
nRBC: 0 % (ref 0.0–0.2)

## 2018-07-15 LAB — LIPASE, BLOOD: LIPASE: 38 U/L (ref 11–51)

## 2018-07-15 MED ORDER — MORPHINE SULFATE (PF) 4 MG/ML IV SOLN
4.0000 mg | INTRAVENOUS | Status: DC | PRN
  Administered 2018-07-15: 4 mg via INTRAVENOUS
  Filled 2018-07-15: qty 1

## 2018-07-15 MED ORDER — IOHEXOL 300 MG/ML  SOLN
100.0000 mL | Freq: Once | INTRAMUSCULAR | Status: AC | PRN
  Administered 2018-07-15: 100 mL via INTRAVENOUS

## 2018-07-15 MED ORDER — PROMETHAZINE HCL 25 MG/ML IJ SOLN
12.5000 mg | Freq: Four times a day (QID) | INTRAMUSCULAR | Status: DC | PRN
  Administered 2018-07-15: 12.5 mg via INTRAVENOUS
  Filled 2018-07-15: qty 1

## 2018-07-15 NOTE — ED Notes (Signed)
Patient reports pain to pelvic region/suprapubic area where C-section incision site is x2 weeks. Reports fever of 101 day before yesterday with no other accompanying symptoms. Patient states she sees Dr. Marcelline Mates, but has not been to see her yet for this episode/problem (no ultrasound yet). Patient's C-section was in September. Denies any complications that she is aware of with C-section other than ileus that she developed afterwards that required rehospitalization.

## 2018-07-15 NOTE — ED Notes (Signed)
Observed sitting in waiting room.  No acute distress noted.

## 2018-07-15 NOTE — ED Provider Notes (Signed)
[facility] Emergency Department Provider Note    First MD Initiated Contact with Patient 07/15/18 [phone]     (approximate)  I have reviewed the triage vital signs and the nursing notes.   HISTORY  Chief Complaint Abdominal Pain    HPI Sarah Sosa is a 37 y.o. female status post transabdominal hysterectomy presents with persistent worsening lower abdominal pain associated with nausea vomiting.  No diarrhea.  Denies any discharge.  No vaginal discharge.  No dysuria or hematuria.  States that she is on pain medications at home but the pain is gotten worse.  Denies any recent surgeries.    Past Medical History:  Diagnosis Date  . Arthritis   . Asthma   . Chronic pain disorder 03/19/2018  . GERD (gastroesophageal reflux disease)   . Headache   . Heart murmur   . History of trichomoniasis 03/19/2018  . Hypertension   . PID (pelvic inflammatory disease) 02/14/2018  . Uterine leiomyoma 03/19/2018   Family History  Problem Relation Age of Onset  . Fibroids Mother   . Hypertension Mother   . Arthritis Mother    Past Surgical History:  Procedure Laterality Date  . CHOLECYSTECTOMY    . DENTAL SURGERY    . HYSTERECTOMY ABDOMINAL WITH SALPINGECTOMY Bilateral 04/15/2018   Procedure: HYSTERECTOMY ABDOMINAL WITH BILATERAL SALPINGECTOMY;  Surgeon: Rubie Maid, MD;  Location: [facility] ORS;  Service: Gynecology;  Laterality: Bilateral;  . kenn surgery Bilateral   . KNEE ARTHROSCOPY Left 2012, 2013   Patient Active Problem List   Diagnosis Date Noted  . Ileus ([place]) 04/21/2018  . Postoperative ileus ([place]) 04/20/2018  . Postoperative state 04/15/2018  . Uterine leiomyoma 03/19/2018  . Menorrhagia with regular cycle 03/19/2018  . Chronic pain disorder 03/19/2018  . Pelvic pain 03/19/2018  . Essential hypertension 03/19/2018      Prior to Admission medications   Medication Sig Start Date End Date Taking? Authorizing Provider  ALPRAZolam (XANAX) 0.25 MG tablet  Take 0.25 mg by mouth 2 (two) times daily.    [provider]  cyclobenzaprine (FLEXERIL) 10 MG tablet Take 1 tablet (10 mg total) by mouth 3 (three) times daily as needed for muscle spasms. 05/04/17   Darel Hong, MD  diphenhydramine-acetaminophen (TYLENOL PM) 25-500 MG TABS tablet Take 2 tablets by mouth at bedtime as needed (sleep).    [provider]  lisinopril-hydrochlorothiazide (ZESTORETIC) 10-12.5 MG tablet Take 1 tablet by mouth daily. 04/23/18   Rubie Maid, MD  oxyCODONE-acetaminophen (PERCOCET) 7.5-325 MG tablet Take 1 tablet by mouth 4 (four) times daily.     [provider]  polyethylene glycol (MIRALAX) packet Take 17 g by mouth daily. Take daily for 2 weeks, then daily prn. 04/23/18   Rubie Maid, MD    Allergies Latex; Nsaids; Tomato; and Tramadol    Social History Social History   Tobacco Use  . Smoking status: Current Every Day Smoker    Packs/day: 0.50    Years: 22.00    Pack years: 11.00    Types: Cigarettes  . Smokeless tobacco: Never Used  Substance Use Topics  . Alcohol use: Yes    Comment: occasional  . Drug use: Yes    Types: Marijuana    Review of Systems Patient denies headaches, rhinorrhea, blurry vision, numbness, shortness of breath, chest pain, edema, cough, abdominal pain, nausea, vomiting, diarrhea, dysuria, fevers, rashes or hallucinations unless otherwise stated above in HPI. ____________________________________________   PHYSICAL EXAM:  VITAL SIGNS: Vitals:   07/15/18  0650 07/15/18 1039  BP: (!) 155/92 (!) 156/72  Pulse: 84 (!) 54  Resp: 18 18  Temp:    SpO2: 100% 100%    Constitutional: Alert and oriented.  Eyes: Conjunctivae are normal.  Head: Atraumatic. Nose: No congestion/rhinnorhea. Mouth/Throat: Mucous membranes are moist.   Neck: No stridor. Painless ROM.  Cardiovascular: Normal rate, regular rhythm. Grossly normal heart sounds.  Good peripheral circulation. Respiratory: Normal  respiratory effort.  No retractions. Lungs CTAB. Gastrointestinal: Soft with ttp of lower abd.  No fluctuance or overlying erythema. No distention. No abdominal bruits. No CVA tenderness. Genitourinary:  Musculoskeletal: No lower extremity tenderness nor edema.  No joint effusions. Neurologic:  Normal speech and language. No gross focal neurologic deficits are appreciated. No facial droop Skin:  Skin is warm, dry and intact. No rash noted. Psychiatric: Mood and affect are normal. Speech and behavior are normal.  ____________________________________________   LABS (all labs ordered are listed, but only abnormal results are displayed)  Results for orders placed or performed during the hospital encounter of 07/15/18 (from the past 24 hour(s))  Lipase, blood     Status: None   Collection Time: 07/15/18  2:24 AM  Result Value Ref Range   Lipase 38 11 - 51 U/L  Comprehensive metabolic panel     Status: Abnormal   Collection Time: 07/15/18  2:24 AM  Result Value Ref Range   Sodium 140 135 - 145 mmol/L   Potassium 3.4 (L) 3.5 - 5.1 mmol/L   Chloride 109 98 - 111 mmol/L   CO2 24 22 - 32 mmol/L   Glucose, Bld 120 (H) 70 - 99 mg/dL   BUN 10 6 - 20 mg/dL   Creatinine, Ser 1.14 (H) 0.44 - 1.00 mg/dL   Calcium 9.2 8.9 - 10.3 mg/dL   Total Protein 7.3 6.5 - 8.1 g/dL   Albumin 3.8 3.5 - 5.0 g/dL   AST 14 (L) 15 - 41 U/L   ALT 9 0 - 44 U/L   Alkaline Phosphatase 49 38 - 126 U/L   Total Bilirubin 0.8 0.3 - 1.2 mg/dL   GFR calc non Af Amer >60 >60 mL/min   GFR calc Af Amer >60 >60 mL/min   Anion gap 7 5 - 15  CBC     Status: None   Collection Time: 07/15/18  2:24 AM  Result Value Ref Range   WBC 5.4 4.0 - 10.5 K/uL   RBC 4.45 3.87 - 5.11 MIL/uL   Hemoglobin 13.1 12.0 - 15.0 g/dL   HCT 39.8 36.0 - 46.0 %   MCV 89.4 80.0 - 100.0 fL   MCH 29.4 26.0 - 34.0 pg   MCHC 32.9 30.0 - 36.0 g/dL   RDW 13.3 11.5 - 15.5 %   Platelets 345 150 - 400 K/uL   nRBC 0.0 0.0 - 0.2 %  Urinalysis, Complete w  Microscopic     Status: Abnormal   Collection Time: 07/15/18  2:25 AM  Result Value Ref Range   Color, Urine YELLOW (A) YELLOW   APPearance CLEAR (A) CLEAR   Specific Gravity, Urine 1.026 1.005 - 1.030   pH 8.0 5.0 - 8.0   Glucose, UA NEGATIVE NEGATIVE mg/dL   Hgb urine dipstick NEGATIVE NEGATIVE   Bilirubin Urine NEGATIVE NEGATIVE   Ketones, ur NEGATIVE NEGATIVE mg/dL   Protein, ur 100 (A) NEGATIVE mg/dL   Nitrite NEGATIVE NEGATIVE   Leukocytes, UA NEGATIVE NEGATIVE   RBC / HPF 0-5 0 - 5 RBC/hpf  WBC, UA 0-5 0 - 5 WBC/hpf   Bacteria, UA NONE SEEN NONE SEEN   Squamous Epithelial / LPF 0-5 0 - 5   Mucus PRESENT    ____________________________________________   ____________________________________________  RADIOLOGY  I personally reviewed all radiographic images ordered to evaluate for the above acute complaints and reviewed radiology reports and findings.  These findings were personally discussed with the patient.  Please see medical record for radiology report.  ____________________________________________   PROCEDURES  Procedure(s) performed:  Procedures    Critical Care performed: no ____________________________________________   INITIAL IMPRESSION / ASSESSMENT AND PLAN / ED COURSE  Pertinent labs & imaging results that were available during my care of the patient were reviewed by me and considered in my medical decision making (see chart for details).   DDX: Colitis, appendicitis, TOA, abscess, seroma, cellulitis, pain syndrome  Evalena Blankenhorn is a 37 y.o. who presents to the ED with symptoms as described above.  Patient in no acute distress.  But does have abdominal pain as described above.  Given her previous history will order CT imaging to evaluate for the bladder complaints.  CT imaging does not show any evidence of acute process.  Blood work is reassuring.  Patient stable and appropriate for outpatient follow-up.      As part of my medical decision  making, I reviewed the following data within the [place] notes reviewed and incorporated, Labs reviewed, notes from prior ED visits.  ____________________________________________   FINAL CLINICAL IMPRESSION(S) / ED DIAGNOSES  Final diagnoses:  Lower abdominal pain      NEW MEDICATIONS STARTED DURING THIS VISIT:  Discharge Medication List as of 07/15/2018 10:09 AM       Note:  This document was prepared using Dragon voice recognition software and may include unintentional dictation errors.    Merlyn Lot, MD 07/15/18 1101

## 2018-07-15 NOTE — ED Triage Notes (Signed)
Patient reports lower abdominal pain at c-section incision (done in September) for the past 2 weeks.

## 2018-07-15 NOTE — Discharge Instructions (Signed)

## 2018-08-11 ENCOUNTER — Emergency Department: Payer: Medicaid Other

## 2018-08-11 ENCOUNTER — Other Ambulatory Visit: Payer: Self-pay

## 2018-08-11 ENCOUNTER — Emergency Department
Admission: EM | Admit: 2018-08-11 | Discharge: 2018-08-11 | Disposition: A | Discharge status: Home / Self Care | Payer: Medicaid Other | Attending: Emergency Medicine | Admitting: Emergency Medicine

## 2018-08-11 DIAGNOSIS — F17210 Nicotine dependence, cigarettes, uncomplicated: Secondary | ICD-10-CM | POA: Insufficient documentation

## 2018-08-11 DIAGNOSIS — I10 Essential (primary) hypertension: Secondary | ICD-10-CM | POA: Diagnosis not present

## 2018-08-11 DIAGNOSIS — Z79899 Other long term (current) drug therapy: Secondary | ICD-10-CM | POA: Insufficient documentation

## 2018-08-11 DIAGNOSIS — J45909 Unspecified asthma, uncomplicated: Secondary | ICD-10-CM | POA: Diagnosis not present

## 2018-08-11 DIAGNOSIS — M25511 Pain in right shoulder: Secondary | ICD-10-CM | POA: Insufficient documentation

## 2018-08-11 MED ORDER — OXYCODONE-ACETAMINOPHEN 5-325 MG PO TABS
1.0000 | ORAL_TABLET | Freq: Once | ORAL | Status: AC
  Administered 2018-08-11: 1 via ORAL
  Filled 2018-08-11: qty 1

## 2018-08-11 MED ORDER — ONDANSETRON 4 MG PO TBDP
4.0000 mg | ORAL_TABLET | Freq: Once | ORAL | Status: DC
  Filled 2018-08-11: qty 1

## 2018-08-11 NOTE — ED Notes (Signed)
Pt already accessed by provider

## 2018-08-11 NOTE — ED Notes (Signed)
Discharge instructions reviewed with patient. Questions fielded by this RN. Patient verbalizes understanding of instructions. Patient discharged home in stable condition per provider. No acute distress noted at time of discharge.

## 2018-08-11 NOTE — ED Provider Notes (Signed)
[facility] Emergency Department Provider Note  ____________________________________________  Time seen: Approximately 11:04 PM  I have reviewed the triage vital signs and the nursing notes.   HISTORY  Chief Complaint Shoulder Pain    HPI Sarah Sosa is a 38 y.o. female presents to the emergency department with acute right shoulder pain after patient reports that she sustained a mechanical, non-syncopal fall while ascending steps.  Patient did not hit her head or lose consciousness.  Patient is currently under the care of pain management and takes oxycodone 7.5 mg 3 times daily.  Patient is apprehensive that she is going to run out of her pain medication.  Patient was given a 31-day supply on July 22, 2018.  Patient contacted her pain management facility tonight after fall and he referred her to the emergency department stating that if the emergency department provides additional pain medication at discharge "he would approve it".  She denies chest pain, chest tightness, shortness of breath, nausea, vomiting or abdominal pain.  Patient has history of hypertension and has been noncompliant with her hypertensive medications today.   Past Medical History:  Diagnosis Date  . Arthritis   . Asthma   . Chronic pain disorder 03/19/2018  . GERD (gastroesophageal reflux disease)   . Headache   . Heart murmur   . History of trichomoniasis 03/19/2018  . Hypertension   . PID (pelvic inflammatory disease) 02/14/2018  . Uterine leiomyoma 03/19/2018    Patient Active Problem List   Diagnosis Date Noted  . Ileus ([place]) 04/21/2018  . Postoperative ileus ([place]) 04/20/2018  . Postoperative state 04/15/2018  . Uterine leiomyoma 03/19/2018  . Menorrhagia with regular cycle 03/19/2018  . Chronic pain disorder 03/19/2018  . Pelvic pain 03/19/2018  . Essential hypertension 03/19/2018    Past Surgical History:  Procedure Laterality Date  . CHOLECYSTECTOMY    . DENTAL SURGERY     . HYSTERECTOMY ABDOMINAL WITH SALPINGECTOMY Bilateral 04/15/2018   Procedure: HYSTERECTOMY ABDOMINAL WITH BILATERAL SALPINGECTOMY;  Surgeon: Rubie Maid, MD;  Location: [facility] ORS;  Service: Gynecology;  Laterality: Bilateral;  . kenn surgery Bilateral   . KNEE ARTHROSCOPY Left 2012, 2013    Prior to Admission medications   Medication Sig Start Date End Date Taking? Authorizing Provider  ALPRAZolam (XANAX) 0.25 MG tablet Take 0.25 mg by mouth 2 (two) times daily.    [provider]  cyclobenzaprine (FLEXERIL) 10 MG tablet Take 1 tablet (10 mg total) by mouth 3 (three) times daily as needed for muscle spasms. 05/04/17   Darel Hong, MD  diphenhydramine-acetaminophen (TYLENOL PM) 25-500 MG TABS tablet Take 2 tablets by mouth at bedtime as needed (sleep).    [provider]  lisinopril-hydrochlorothiazide (ZESTORETIC) 10-12.5 MG tablet Take 1 tablet by mouth daily. 04/23/18   Rubie Maid, MD  oxyCODONE-acetaminophen (PERCOCET) 7.5-325 MG tablet Take 1 tablet by mouth 4 (four) times daily.     [provider]  polyethylene glycol (MIRALAX) packet Take 17 g by mouth daily. Take daily for 2 weeks, then daily prn. 04/23/18   Rubie Maid, MD    Allergies Latex; Nsaids; Tomato; and Tramadol  Family History  Problem Relation Age of Onset  . Fibroids Mother   . Hypertension Mother   . Arthritis Mother     Social History Social History   Tobacco Use  . Smoking status: Current Every Day Smoker    Packs/day: 0.50    Years: 22.00    Pack years: 11.00    Types: Cigarettes  .  Smokeless tobacco: Never Used  Substance Use Topics  . Alcohol use: Yes    Comment: occasional  . Drug use: Yes    Types: Marijuana     Review of Systems  Constitutional: No fever/chills Eyes: No visual changes. No discharge ENT: No upper respiratory complaints. Cardiovascular: no chest pain. Respiratory: no cough. No SOB. Gastrointestinal: No abdominal pain.  No nausea, no  vomiting.  No diarrhea.  No constipation. Musculoskeletal: Patient has right shoulder pain.  Skin: Negative for rash, abrasions, lacerations, ecchymosis. Neurological: Negative for headaches, focal weakness or numbness. ____________________________________________   PHYSICAL EXAM:  VITAL SIGNS: ED Triage Vitals  Enc Vitals Group     BP 08/11/18 2158 (!) 206/116     Pulse Rate 08/11/18 2158 83     Resp 08/11/18 2158 18     Temp 08/11/18 2158 98.4 F (36.9 C)     Temp Source 08/11/18 2158 Oral     SpO2 08/11/18 2158 100 %     Weight --      Height --      Head Circumference --      Peak Flow --      Pain Score 08/11/18 2155 10     Pain Loc --      Pain Edu? --      Excl. in [place]? --      Constitutional: Alert and oriented. Well appearing and in no acute distress. Eyes: Conjunctivae are normal. PERRL. EOMI. Head: Atraumatic. Cardiovascular: Normal rate, regular rhythm. Normal S1 and S2.  Good peripheral circulation. Respiratory: Normal respiratory effort without tachypnea or retractions. Lungs CTAB. Good air entry to the bases with no decreased or absent breath sounds. Musculoskeletal: Provocative testing at the right shoulder is limited due to pain.  No pain with palpation over the clavicle or the right AC joint.  Patient is able to spread her right fingers.  She can perform opposition at the right hand.  She can perform flexion at the IP joint of the right thumb.  She can perform pronation and supination at the right elbow without difficulty.  Palpable radial pulse, right. Neurologic:  Normal speech and language. No gross focal neurologic deficits are appreciated.  Skin:  Skin is warm, dry and intact. No rash noted. Psychiatric: Mood and affect are normal. Speech and behavior are normal. Patient exhibits appropriate insight and judgement.   ____________________________________________   LABS (all labs ordered are listed, but only abnormal results are displayed)  Labs  Reviewed - No data to display ____________________________________________  EKG   ____________________________________________  RADIOLOGY I personally viewed and evaluated these images as part of my medical decision making, as well as reviewing the written report by the radiologist.  Dg Shoulder Right  Result Date: 08/11/2018 CLINICAL DATA:  Shoulder pain after fall EXAM: RIGHT SHOULDER - 2+ VIEW COMPARISON:  None. FINDINGS: There is no evidence of fracture or dislocation. There is no evidence of arthropathy or other focal bone abnormality. Soft tissues are unremarkable. IMPRESSION: Negative. Electronically Signed   By: Donavan Foil M.D.   On: 08/11/2018 22:31    ____________________________________________    PROCEDURES  Procedure(s) performed:    Procedures    Medications  ondansetron (ZOFRAN-ODT) disintegrating tablet 4 mg (4 mg Oral Refused 08/11/18 2216)  oxyCODONE-acetaminophen (PERCOCET/ROXICET) 5-325 MG per tablet 1 tablet (1 tablet Oral Given 08/11/18 2215)     ____________________________________________   INITIAL IMPRESSION / ASSESSMENT AND PLAN / ED COURSE  Pertinent labs & imaging results that were available  during my care of the patient were reviewed by me and considered in my medical decision making (see chart for details).  Review of the [place] CSRS was performed in accordance of the [place] prior to dispensing any controlled drugs.       Assessment and plan Right shoulder pain Patient presents to the emergency department with acute right shoulder pain that occurred tonight after a fall.  Patient was given Roxicet in the emergency department and she reported no improvement in her pain.  X-ray examination the right shoulder revealed no acute abnormality.  I was unable to perform provocative testing of the right shoulder as patient stated that her pain was too severe.  Patient's history was reviewed and the [facility] drug database and I declined prescribing  patient additional narcotics.  Patient was prescribed a 31-day supply of Percocet on July 22, 2018.  Patient was given a referral to orthopedics.  Patient was advised to follow-up with primary care regarding hypertension noted at triage.  All patient questions were answered.     ____________________________________________  FINAL CLINICAL IMPRESSION(S) / ED DIAGNOSES  Final diagnoses:  Acute pain of right shoulder      NEW MEDICATIONS STARTED DURING THIS VISIT:  ED Discharge Orders    None          This chart was dictated using voice recognition software/Dragon. Despite best efforts to proofread, errors can occur which can change the meaning. Any change was purely unintentional.    Lannie Fields, PA-C 08/11/18 2311    Harvest Dark, MD 08/11/18 2312

## 2018-08-11 NOTE — ED Triage Notes (Signed)
Patient reports knee gave out while going up steps and she fell and re injured her right shoulder.

## 2018-10-27 ENCOUNTER — Other Ambulatory Visit: Payer: Self-pay

## 2018-10-27 ENCOUNTER — Emergency Department
Admission: EM | Admit: 2018-10-27 | Discharge: 2018-10-27 | Disposition: A | Discharge status: Home / Self Care | Payer: Medicaid Other | Attending: Emergency Medicine | Admitting: Emergency Medicine

## 2018-10-27 ENCOUNTER — Emergency Department: Payer: Medicaid Other

## 2018-10-27 DIAGNOSIS — I10 Essential (primary) hypertension: Secondary | ICD-10-CM | POA: Diagnosis not present

## 2018-10-27 DIAGNOSIS — F17210 Nicotine dependence, cigarettes, uncomplicated: Secondary | ICD-10-CM | POA: Diagnosis not present

## 2018-10-27 DIAGNOSIS — J029 Acute pharyngitis, unspecified: Secondary | ICD-10-CM | POA: Diagnosis not present

## 2018-10-27 DIAGNOSIS — J45909 Unspecified asthma, uncomplicated: Secondary | ICD-10-CM | POA: Insufficient documentation

## 2018-10-27 DIAGNOSIS — Z91040 Latex allergy status: Secondary | ICD-10-CM | POA: Insufficient documentation

## 2018-10-27 DIAGNOSIS — L049 Acute lymphadenitis, unspecified: Secondary | ICD-10-CM | POA: Diagnosis not present

## 2018-10-27 DIAGNOSIS — Z79899 Other long term (current) drug therapy: Secondary | ICD-10-CM | POA: Insufficient documentation

## 2018-10-27 LAB — CBC
HCT: 40.7 % (ref 36.0–46.0)
Hemoglobin: 13.6 g/dL (ref 12.0–15.0)
MCH: 30.9 pg (ref 26.0–34.0)
MCHC: 33.4 g/dL (ref 30.0–36.0)
MCV: 92.5 fL (ref 80.0–100.0)
Platelets: 303 10*3/uL (ref 150–400)
RBC: 4.4 MIL/uL (ref 3.87–5.11)
RDW: 12.9 % (ref 11.5–15.5)
WBC: 5.5 10*3/uL (ref 4.0–10.5)
nRBC: 0 % (ref 0.0–0.2)

## 2018-10-27 LAB — BASIC METABOLIC PANEL
Anion gap: 10 (ref 5–15)
BUN: 7 mg/dL (ref 6–20)
CO2: 25 mmol/L (ref 22–32)
Calcium: 8.9 mg/dL (ref 8.9–10.3)
Chloride: 104 mmol/L (ref 98–111)
Creatinine, Ser: 0.77 mg/dL (ref 0.44–1.00)
GFR calc Af Amer: >60 mL/min (ref >60)
GFR calc non Af Amer: >60 mL/min (ref >60)
Glucose, Bld: 139 mg/dL — ABNORMAL HIGH (ref 70–99)
Potassium: 3 mmol/L — ABNORMAL LOW (ref 3.5–5.1)
Sodium: 139 mmol/L (ref 135–145)

## 2018-10-27 LAB — GROUP A STREP BY PCR: Group A Strep by PCR: NOT DETECTED

## 2018-10-27 MED ORDER — MENTHOL 3 MG MT LOZG
1.0000 | LOZENGE | OROMUCOSAL | Status: DC | PRN
  Filled 2018-10-27: qty 9

## 2018-10-27 MED ORDER — MORPHINE SULFATE (PF) 4 MG/ML IV SOLN
4.0000 mg | Freq: Once | INTRAVENOUS | Status: AC
  Administered 2018-10-27: 4 mg via INTRAVENOUS
  Filled 2018-10-27: qty 1

## 2018-10-27 MED ORDER — IOHEXOL 300 MG/ML  SOLN
75.0000 mL | Freq: Once | INTRAMUSCULAR | Status: AC | PRN
  Administered 2018-10-27: 75 mL via INTRAVENOUS
  Filled 2018-10-27: qty 75

## 2018-10-27 MED ORDER — DEXAMETHASONE SODIUM PHOSPHATE 4 MG/ML IJ SOLN
8.0000 mg | Freq: Once | INTRAMUSCULAR | Status: AC
  Administered 2018-10-27: 8 mg via INTRAVENOUS
  Filled 2018-10-27 (×2): qty 2

## 2018-10-27 NOTE — ED Triage Notes (Signed)
Pt states she noticed lumps to R sick of neck/jaw yesterday with sore throat. Painful to swallow. Denies fever. A&O, ambulatory.   States hx of HTN, takes BP meds at night.

## 2018-10-27 NOTE — ED Notes (Signed)
First Nurse Note: Pt to ED via POV c/o swelling on the right side of her neck. Pt is in NAD.

## 2018-10-27 NOTE — ED Provider Notes (Signed)
[facility] Emergency Department Provider Note   ____________________________________________   First MD Initiated Contact with Patient 10/27/18 1533     (approximate)  I have reviewed the triage vital signs and the nursing notes.   HISTORY  Chief Complaint Sore Throat    HPI Sarah Sosa is a 38 y.o. female has a history of hypertension  Patient presents today states that over the last days she is noticed a very sore throat, sore when she swallows and she is noticed that she feels like the front of her neck all the glands and are swollen   No nausea vomiting.  The glands feel more swollen over the right side.  No pain in her teeth.  No swelling inside the mouth.  Reports is painful to swallow but she is able to swallow.  Reports she did not take her blood pressure medicine last night, but thinks she brought it with her and can take it here in the ER.  No chest pain or shortness of breath.  No nausea or vomiting.  Denies pregnancy.  Not take any medication to alleviate the symptoms  Past Medical History:  Diagnosis Date  . Arthritis   . Asthma   . Chronic pain disorder 03/19/2018  . GERD (gastroesophageal reflux disease)   . Headache   . Heart murmur   . History of trichomoniasis 03/19/2018  . Hypertension   . PID (pelvic inflammatory disease) 02/14/2018  . Uterine leiomyoma 03/19/2018    Patient Active Problem List   Diagnosis Date Noted  . Ileus ([place]) 04/21/2018  . Postoperative ileus ([place]) 04/20/2018  . Postoperative state 04/15/2018  . Uterine leiomyoma 03/19/2018  . Menorrhagia with regular cycle 03/19/2018  . Chronic pain disorder 03/19/2018  . Pelvic pain 03/19/2018  . Essential hypertension 03/19/2018    Past Surgical History:  Procedure Laterality Date  . CHOLECYSTECTOMY    . DENTAL SURGERY    . HYSTERECTOMY ABDOMINAL WITH SALPINGECTOMY Bilateral 04/15/2018   Procedure: HYSTERECTOMY ABDOMINAL WITH BILATERAL SALPINGECTOMY;  Surgeon:  Rubie Maid, MD;  Location: [facility] ORS;  Service: Gynecology;  Laterality: Bilateral;  . kenn surgery Bilateral   . KNEE ARTHROSCOPY Left 2012, 2013    Prior to Admission medications   Medication Sig Start Date End Date Taking? Authorizing Provider  ALPRAZolam (XANAX) 0.25 MG tablet Take 0.25 mg by mouth 2 (two) times daily.    [provider]  cyclobenzaprine (FLEXERIL) 10 MG tablet Take 1 tablet (10 mg total) by mouth 3 (three) times daily as needed for muscle spasms. 05/04/17   Darel Hong, MD  diphenhydramine-acetaminophen (TYLENOL PM) 25-500 MG TABS tablet Take 2 tablets by mouth at bedtime as needed (sleep).    [provider]  lisinopril-hydrochlorothiazide (ZESTORETIC) 10-12.5 MG tablet Take 1 tablet by mouth daily. 04/23/18   Rubie Maid, MD  oxyCODONE-acetaminophen (PERCOCET) 7.5-325 MG tablet Take 1 tablet by mouth 4 (four) times daily.     [provider]  polyethylene glycol (MIRALAX) packet Take 17 g by mouth daily. Take daily for 2 weeks, then daily prn. 04/23/18   Rubie Maid, MD    Allergies Latex; Nsaids; Tomato; and Tramadol  Family History  Problem Relation Age of Onset  . Fibroids Mother   . Hypertension Mother   . Arthritis Mother     Social History Social History   Tobacco Use  . Smoking status: Current Every Day Smoker    Packs/day: 0.50    Years: 22.00    Pack years: 11.00  Types: Cigarettes  . Smokeless tobacco: Never Used  Substance Use Topics  . Alcohol use: Yes    Comment: occasional  . Drug use: Yes    Types: Marijuana    Review of Systems Constitutional: No fever/chills Eyes: No visual changes. ENT: See HPI Cardiovascular: Denies chest pain. Respiratory: Denies shortness of breath. Gastrointestinal: No abdominal pain.   Genitourinary: Negative for dysuria. Musculoskeletal: Negative for back pain. Skin: Negative for rash. Neurological: Negative for headaches, areas of focal weakness or numbness.     ____________________________________________   PHYSICAL EXAM:  VITAL SIGNS: ED Triage Vitals  Enc Vitals Group     BP 10/27/18 1527 (!) 201/98     Pulse Rate 10/27/18 1527 93     Resp 10/27/18 1527 16     Temp 10/27/18 1527 98.6 F (37 C)     Temp Source 10/27/18 1527 Oral     SpO2 10/27/18 1527 99 %     Weight 10/27/18 1528 190 lb (86.2 kg)     Height 10/27/18 1528 5\' 7"  (1.702 m)     Head Circumference --      Peak Flow --      Pain Score 10/27/18 1527 10     Pain Loc --      Pain Edu? --      Excl. in [place]? --     Constitutional: Alert and oriented. Well appearing and in no acute distress. Eyes: Conjunctivae are normal. Head: Atraumatic. Nose: No congestion/rhinnorhea.  Tympanic membranes normal bilaterally. Mouth/Throat: Mucous membranes are moist.  No dental pain or dental lesions. Neck: No stridor.  She has shotty tender anterior cervical adenopathy, questionably slightly worse on the right than the left.  No meningismus. Cardiovascular: Normal rate, regular rhythm. Grossly normal heart sounds.  Good peripheral circulation. Respiratory: Normal respiratory effort.  No retractions. Lungs CTAB.  He can full and clear sentences Gastrointestinal: Soft and nontender. No distention. Musculoskeletal: No lower extremity tenderness nor edema. Neurologic:  Normal speech and language. No gross focal neurologic deficits are appreciated.  Skin:  Skin is warm, dry and intact. No rash noted. Psychiatric: Mood and affect are normal. Speech and behavior are normal.  ____________________________________________   LABS (all labs ordered are listed, but only abnormal results are displayed)  Labs Reviewed  BASIC METABOLIC PANEL - Abnormal; Notable for the following components:      Result Value   Potassium 3.0 (*)    Glucose, Bld 139 (*)    All other components within normal limits  GROUP A STREP BY PCR  CBC   ____________________________________________  EKG    ____________________________________________  RADIOLOGY  Ct Soft Tissue Neck W Contrast  Result Date: 10/27/2018 CLINICAL DATA:  Sore throat with right-sided adenopathy EXAM: CT NECK WITH CONTRAST TECHNIQUE: Multidetector CT imaging of the neck was performed using the standard protocol following the bolus administration of intravenous contrast. CONTRAST:  33mL OMNIPAQUE IOHEXOL 300 MG/ML  SOLN COMPARISON:  None. FINDINGS: Pharynx and larynx: Mild prominence of the tonsils without submucosal edema or abnormal enhancement. Negative for abscess. Salivary glands: No inflammation, mass, or stone. Thyroid: Generous size without asymmetry, nodule or evident inflammation Lymph nodes: Symmetric prominence of upper jugular lymph nodes. There is asymmetric right submandibular nodal swelling with mild adjacent fat edema. Vascular: Negative. Limited intracranial: Negative. Visualized orbits: Negative. Mastoids and visualized paranasal sinuses: Clear. Skeleton: Lower cervical degenerative disc narrowing and ridging Upper chest: Negative IMPRESSION: Nonspecific adenitis.  No abscess or airway swelling. Electronically Signed   By:  Monte Fantasia M.D.   On: 10/27/2018 17:08    CT reviewed adenitis. ____________________________________________   PROCEDURES  Procedure(s) performed: None  Procedures  Critical Care performed: No  ____________________________________________   INITIAL IMPRESSION / ASSESSMENT AND PLAN / ED COURSE  Pertinent labs & imaging results that were available during my care of the patient were reviewed by me and considered in my medical decision making (see chart for details).   Patient notes for sore throat.  On clinical examination obviously has anterior cervical adenopathy.  No tonsillar exudates or swelling.  Oropharynx is widely patent slightly erythematous.  Reassuring clinical examination, she is notably hypertensive but reports missing her medication, she brought with her and  we will have her administer it here but she has known hypertension and is treated for this already.  She is not having any symptoms of hypertensive emergency or urgency.  Brent Vangorden was evaluated in Emergency Department on 10/27/2018 for the symptoms described in the history of present illness. She was evaluated in the context of the global COVID-19 pandemic, which necessitated consideration that the patient might be at risk for infection with the SARS-CoV-2 virus that causes COVID-19. Institutional protocols and algorithms that pertain to the evaluation of patients at risk for COVID-19 are in a state of rapid change based on information released by regulatory bodies including the CDC and federal and state organizations. These policies and algorithms were followed during the patient's care in the ED.     Reassuring clinical examination.  She does have evidence of pharyngitis and sore throat but is handling secretions well.  Because of the amount of discomfort and pain and adenopathy will proceed with CT scan to rule out abscess, I doubt something like epiglottitis.   Strep negative.  Patient feels quite it better after throat lozenges and morphine.  Resting comfortably.  She did not have her medication with her to take for blood pressure, but she reports she will take it at home and she will follow-up with her doctor on this.  Careful return precautions around sore throat, pharyngitis discussed and reviewed with the patient is in agreement  Return precautions and treatment recommendations and follow-up discussed with the patient who is agreeable with the plan.   ____________________________________________   FINAL CLINICAL IMPRESSION(S) / ED DIAGNOSES  Final diagnoses:  Adenitis, acute  Pharyngitis, unspecified etiology        Note:  This document was prepared using Dragon voice recognition software and may include unintentional dictation errors       Delman Kitten, MD 10/27/18 1800

## 2018-10-27 NOTE — Discharge Instructions (Signed)
No driving today as you received a dose of morphine today.  Please make sure to take your blood pressure medication as precribed.  As we discussed, though you do have high blood pressure (hypertension), fortunately it is not immediately dangerous at this time and does not need emergency intervention or admission to the hospital.  If we add to or change your regular medications, we may cause more harm than good - it is more appropriate for your primary care doctor to evaluate you in clinic and decide if any medication changes are needed.  Please follow up in clinic as recommended in these papers.

## 2018-10-29 ENCOUNTER — Other Ambulatory Visit: Payer: Self-pay

## 2018-10-29 ENCOUNTER — Emergency Department: Payer: Medicaid Other

## 2018-10-29 ENCOUNTER — Encounter: Payer: Self-pay | Admitting: *Deleted

## 2018-10-29 ENCOUNTER — Emergency Department
Admission: EM | Admit: 2018-10-29 | Discharge: 2018-10-29 | Disposition: A | Discharge status: Home / Self Care | Payer: Medicaid Other | Attending: Emergency Medicine | Admitting: Emergency Medicine

## 2018-10-29 DIAGNOSIS — J45909 Unspecified asthma, uncomplicated: Secondary | ICD-10-CM | POA: Diagnosis not present

## 2018-10-29 DIAGNOSIS — Z9049 Acquired absence of other specified parts of digestive tract: Secondary | ICD-10-CM | POA: Insufficient documentation

## 2018-10-29 DIAGNOSIS — I10 Essential (primary) hypertension: Secondary | ICD-10-CM | POA: Insufficient documentation

## 2018-10-29 DIAGNOSIS — Z79899 Other long term (current) drug therapy: Secondary | ICD-10-CM | POA: Insufficient documentation

## 2018-10-29 DIAGNOSIS — F17210 Nicotine dependence, cigarettes, uncomplicated: Secondary | ICD-10-CM | POA: Diagnosis not present

## 2018-10-29 DIAGNOSIS — J029 Acute pharyngitis, unspecified: Secondary | ICD-10-CM | POA: Diagnosis present

## 2018-10-29 MED ORDER — DEXAMETHASONE SODIUM PHOSPHATE 10 MG/ML IJ SOLN
10.0000 mg | Freq: Once | INTRAMUSCULAR | Status: AC
  Administered 2018-10-29: 10 mg via INTRAVENOUS
  Filled 2018-10-29: qty 1

## 2018-10-29 MED ORDER — HYDROCODONE-ACETAMINOPHEN 5-325 MG PO TABS: 1.0000 | ORAL_TABLET | Freq: Four times a day (QID) | ORAL | 0 refills | Status: DC | PRN

## 2018-10-29 MED ORDER — MORPHINE SULFATE (PF) 4 MG/ML IV SOLN
4.0000 mg | Freq: Once | INTRAVENOUS | Status: AC
  Administered 2018-10-29: 4 mg via INTRAVENOUS
  Filled 2018-10-29: qty 1

## 2018-10-29 MED ORDER — DEXAMETHASONE 4 MG PO TABS: 12.0000 mg | ORAL_TABLET | Freq: Once | ORAL | 0 refills | Status: AC

## 2018-10-29 MED ORDER — DEXAMETHASONE 4 MG PO TABS: 12.0000 mg | ORAL_TABLET | Freq: Once | ORAL | 0 refills | Status: DC

## 2018-10-29 NOTE — ED Triage Notes (Signed)
Pt reports a sore throat for 5 days.  Pt seen here recently for similar sx.  Pt has a dry cough.  cig smoker.  Pt alert.

## 2018-10-29 NOTE — Discharge Instructions (Addendum)
Return to the ER for new or worsening pain, inability to swallow, fever, weakness or any other new or worsening symptoms that concern you.    You should continue tylenol for pain; you can take the vicodin as needed if you have more severe pain.  Take the decadron once, the day after tomorrow.

## 2018-10-29 NOTE — ED Triage Notes (Signed)
First Nurse Note:  C/O no improvement to sore throat since last visit to ED on Monday.  States neck/jaw yesterday.  Painful to swallow. States unable to eat or drink anything.  No SOB/ DOE noted, but  Patient has laryngitis.

## 2018-10-29 NOTE — ED Provider Notes (Signed)
[facility] Emergency Department Provider Note ____________________________________________   First MD Initiated Contact with Patient 10/29/18 1718     (approximate)  I have reviewed the triage vital signs and the nursing notes.   HISTORY  Chief Complaint Sore Throat    HPI Sarah Sosa is a 38 y.o. female with PMH as noted below who presents with sore throat over the last 3 to 4 days, persistent course, associated with difficulty swallowing and pain on swallowing, and also associated with nonproductive cough and mild shortness of breath.  She denies any fever.  She is able to swallow her saliva and drink water, and is able to eat food but has pain.  The patient was seen in the ER for this 2 days ago.  She states that she had some swelling and glands to her neck especially on the right side which have come down, but the other symptoms have not really improved since she left the hospital.  Past Medical History:  Diagnosis Date  . Arthritis   . Asthma   . Chronic pain disorder 03/19/2018  . GERD (gastroesophageal reflux disease)   . Headache   . Heart murmur   . History of trichomoniasis 03/19/2018  . Hypertension   . PID (pelvic inflammatory disease) 02/14/2018  . Uterine leiomyoma 03/19/2018    Patient Active Problem List   Diagnosis Date Noted  . Ileus ([place]) 04/21/2018  . Postoperative ileus ([place]) 04/20/2018  . Postoperative state 04/15/2018  . Uterine leiomyoma 03/19/2018  . Menorrhagia with regular cycle 03/19/2018  . Chronic pain disorder 03/19/2018  . Pelvic pain 03/19/2018  . Essential hypertension 03/19/2018    Past Surgical History:  Procedure Laterality Date  . CHOLECYSTECTOMY    . DENTAL SURGERY    . HYSTERECTOMY ABDOMINAL WITH SALPINGECTOMY Bilateral 04/15/2018   Procedure: HYSTERECTOMY ABDOMINAL WITH BILATERAL SALPINGECTOMY;  Surgeon: Rubie Maid, MD;  Location: [facility] ORS;  Service: Gynecology;  Laterality: Bilateral;  . kenn surgery  Bilateral   . KNEE ARTHROSCOPY Left 2012, 2013    Prior to Admission medications   Medication Sig Start Date End Date Taking? Authorizing Provider  ALPRAZolam (XANAX) 0.25 MG tablet Take 0.25 mg by mouth 2 (two) times daily.    [provider]  cyclobenzaprine (FLEXERIL) 10 MG tablet Take 1 tablet (10 mg total) by mouth 3 (three) times daily as needed for muscle spasms. 05/04/17   Darel Hong, MD  dexamethasone (DECADRON) 4 MG tablet Take 3 tablets (12 mg total) by mouth once for 1 dose. Take two days after the ER visit 10/31/18 10/31/18  Arta Silence, MD  diphenhydramine-acetaminophen (TYLENOL PM) 25-500 MG TABS tablet Take 2 tablets by mouth at bedtime as needed (sleep).    [provider]  HYDROcodone-acetaminophen (NORCO/VICODIN) 5-325 MG tablet Take 1 tablet by mouth every 6 (six) hours as needed for severe pain. 10/29/18   Arta Silence, MD  lisinopril-hydrochlorothiazide (ZESTORETIC) 10-12.5 MG tablet Take 1 tablet by mouth daily. 04/23/18   Rubie Maid, MD  oxyCODONE-acetaminophen (PERCOCET) 7.5-325 MG tablet Take 1 tablet by mouth 4 (four) times daily.     [provider]  polyethylene glycol (MIRALAX) packet Take 17 g by mouth daily. Take daily for 2 weeks, then daily prn. 04/23/18   Rubie Maid, MD    Allergies Latex; Nsaids; Tomato; and Tramadol  Family History  Problem Relation Age of Onset  . Fibroids Mother   . Hypertension Mother   . Arthritis Mother     Social History  Social History   Tobacco Use  . Smoking status: Current Every Day Smoker    Packs/day: 0.50    Years: 22.00    Pack years: 11.00    Types: Cigarettes  . Smokeless tobacco: Never Used  Substance Use Topics  . Alcohol use: Yes    Comment: occasional  . Drug use: Yes    Types: Marijuana    Review of Systems  Constitutional: No fever. Eyes: No redness. ENT: Positive for sore throat. Cardiovascular: Denies chest pain. Respiratory: Positive for mild  shortness of breath. Gastrointestinal: No vomiting or diarrhea.  Genitourinary: Negative for flank pain.  Musculoskeletal: Negative for back pain. Skin: Negative for rash. Neurological: Negative for headache.   ____________________________________________   PHYSICAL EXAM:  VITAL SIGNS: ED Triage Vitals  Enc Vitals Group     BP 10/29/18 1709 (!) 157/105     Pulse Rate 10/29/18 1709 91     Resp 10/29/18 1709 18     Temp 10/29/18 1709 98 F (36.7 C)     Temp Source 10/29/18 1709 Oral     SpO2 10/29/18 1709 100 %     Weight 10/29/18 1711 190 lb (86.2 kg)     Height 10/29/18 1711 5\' 7"  (1.702 m)     Head Circumference --      Peak Flow --      Pain Score 10/29/18 1711 10     Pain Loc --      Pain Edu? --      Excl. in [place]? --     Constitutional: Alert and oriented.  Relatively well appearing and in no acute distress. Eyes: Conjunctivae are normal.  Head: Atraumatic. Nose: No congestion/rhinnorhea. Mouth/Throat: Mucous membranes are moist.  Oropharynx with erythema.  No significant swelling or exudate. Neck: Normal range of motion.  Mild anterior cervical lymphadenopathy. Cardiovascular: Normal rate, regular rhythm. Grossly normal heart sounds.  Good peripheral circulation. Respiratory: Normal respiratory effort.  No retractions. Lungs CTAB. Gastrointestinal:  No distention.  Musculoskeletal: Extremities warm and well perfused.  Neurologic:  Normal speech and language. No gross focal neurologic deficits are appreciated.  Skin:  Skin is warm and dry. No rash noted. Psychiatric: Mood and affect are normal. Speech and behavior are normal.  ____________________________________________   LABS (all labs ordered are listed, but only abnormal results are displayed)  Labs Reviewed - No data to display ____________________________________________  EKG   ____________________________________________  RADIOLOGY  XR neck soft tissue: No acute abnormality CXR: No acute  abnormality  ____________________________________________   PROCEDURES  Procedure(s) performed: No  Procedures  Critical Care performed: No ____________________________________________   INITIAL IMPRESSION / ASSESSMENT AND PLAN / ED COURSE  Pertinent labs & imaging results that were available during my care of the patient were reviewed by me and considered in my medical decision making (see chart for details).  38 year old female with PMH as noted above presents with persistent throat pain over the last several days, and states she now has lost her voice.  She also had some swelling to the right side of her neck and jaw area which has gone down over the last few days.  She has no fever.  She reports difficulty swallowing, but clarifies that she is able to swallow just with a lot of pain.  She has mild shortness of breath and cough.  On exam the patient is relatively well-appearing.  Her vital signs are normal except for hypertension.  Oropharynx shows erythema but no significant swelling.  There is mild  anterior cervical lymphadenopathy with no palpable masses.  She has no stridor.  The lungs are clear.  I reviewed the past medical records in epic.  The patient was seen in ED on 4/14 with the presentation described above.  Due to the cervical lymphadenopathy and swelling she had a CT neck which showed adenitis but no abscess or airway compromise.  Lab work-up was unremarkable.  Strep was negative.  She improved with Decadron and pain medication.  Overall the presentation is consistent with persistent pharyngitis/laryngitis, likely viral.  At this time there is no evidence of airway compromise.  Because of the patient's young age I would like to avoid unnecessary CT, and based on the clinical presentation there is no indication for repeat CT at this time.  However I would like to obtain some imaging to verify no abnormal airway narrowing.  We will obtain x-rays of the neck as well as chest  x-ray.  I will give another dose of IV Decadron and pain medication.  There is no indication for repeat labs at this time especially as the patient is afebrile and has no signs of sepsis.  ----------------------------------------- 7:44 PM on 10/29/2018 -----------------------------------------  X-rays are negative.  The patient is tolerating p.o. without difficulty and feeling better after the Decadron.  She is stable for discharge home.  I will prescribe an additional Decadron dose for home as well as some pain medication.  Return precautions given, and the patient expresses understanding  ________  Tamera Stands was evaluated in Emergency Department on 10/29/2018 for the symptoms described in the history of present illness. She was evaluated in the context of the global COVID-19 pandemic, which necessitated consideration that the patient might be at risk for infection with the SARS-CoV-2 virus that causes COVID-19. Institutional protocols and algorithms that pertain to the evaluation of patients at risk for COVID-19 are in a state of rapid change based on information released by regulatory bodies including the CDC and federal and state organizations. These policies and algorithms were followed during the patient's care in the ED.  ____________________________________________   FINAL CLINICAL IMPRESSION(S) / ED DIAGNOSES  Final diagnoses:  Viral pharyngitis      NEW MEDICATIONS STARTED DURING THIS VISIT:  Current Discharge Medication List    START taking these medications   Details  dexamethasone (DECADRON) 4 MG tablet Take 3 tablets (12 mg total) by mouth once for 1 dose. Take two days after the ER visit Qty: 3 tablet, Refills: 0    HYDROcodone-acetaminophen (NORCO/VICODIN) 5-325 MG tablet Take 1 tablet by mouth every 6 (six) hours as needed for severe pain. Qty: 5 tablet, Refills: 0         Note:  This document was prepared using Dragon voice recognition software and may  include unintentional dictation errors.    Arta Silence, MD 10/29/18 1944

## 2018-10-30 IMAGING — DX DG ABD PORTABLE 2V
2 series · 2 of 2 positions shown · non-contrast
Comparison: Abdominal radiographs of April 21, 2018

CLINICAL DATA: Abdominal distension since hysterectomy 1 week ago.

EXAM:
PORTABLE ABDOMEN - 2 VIEW

[abdomen erect]
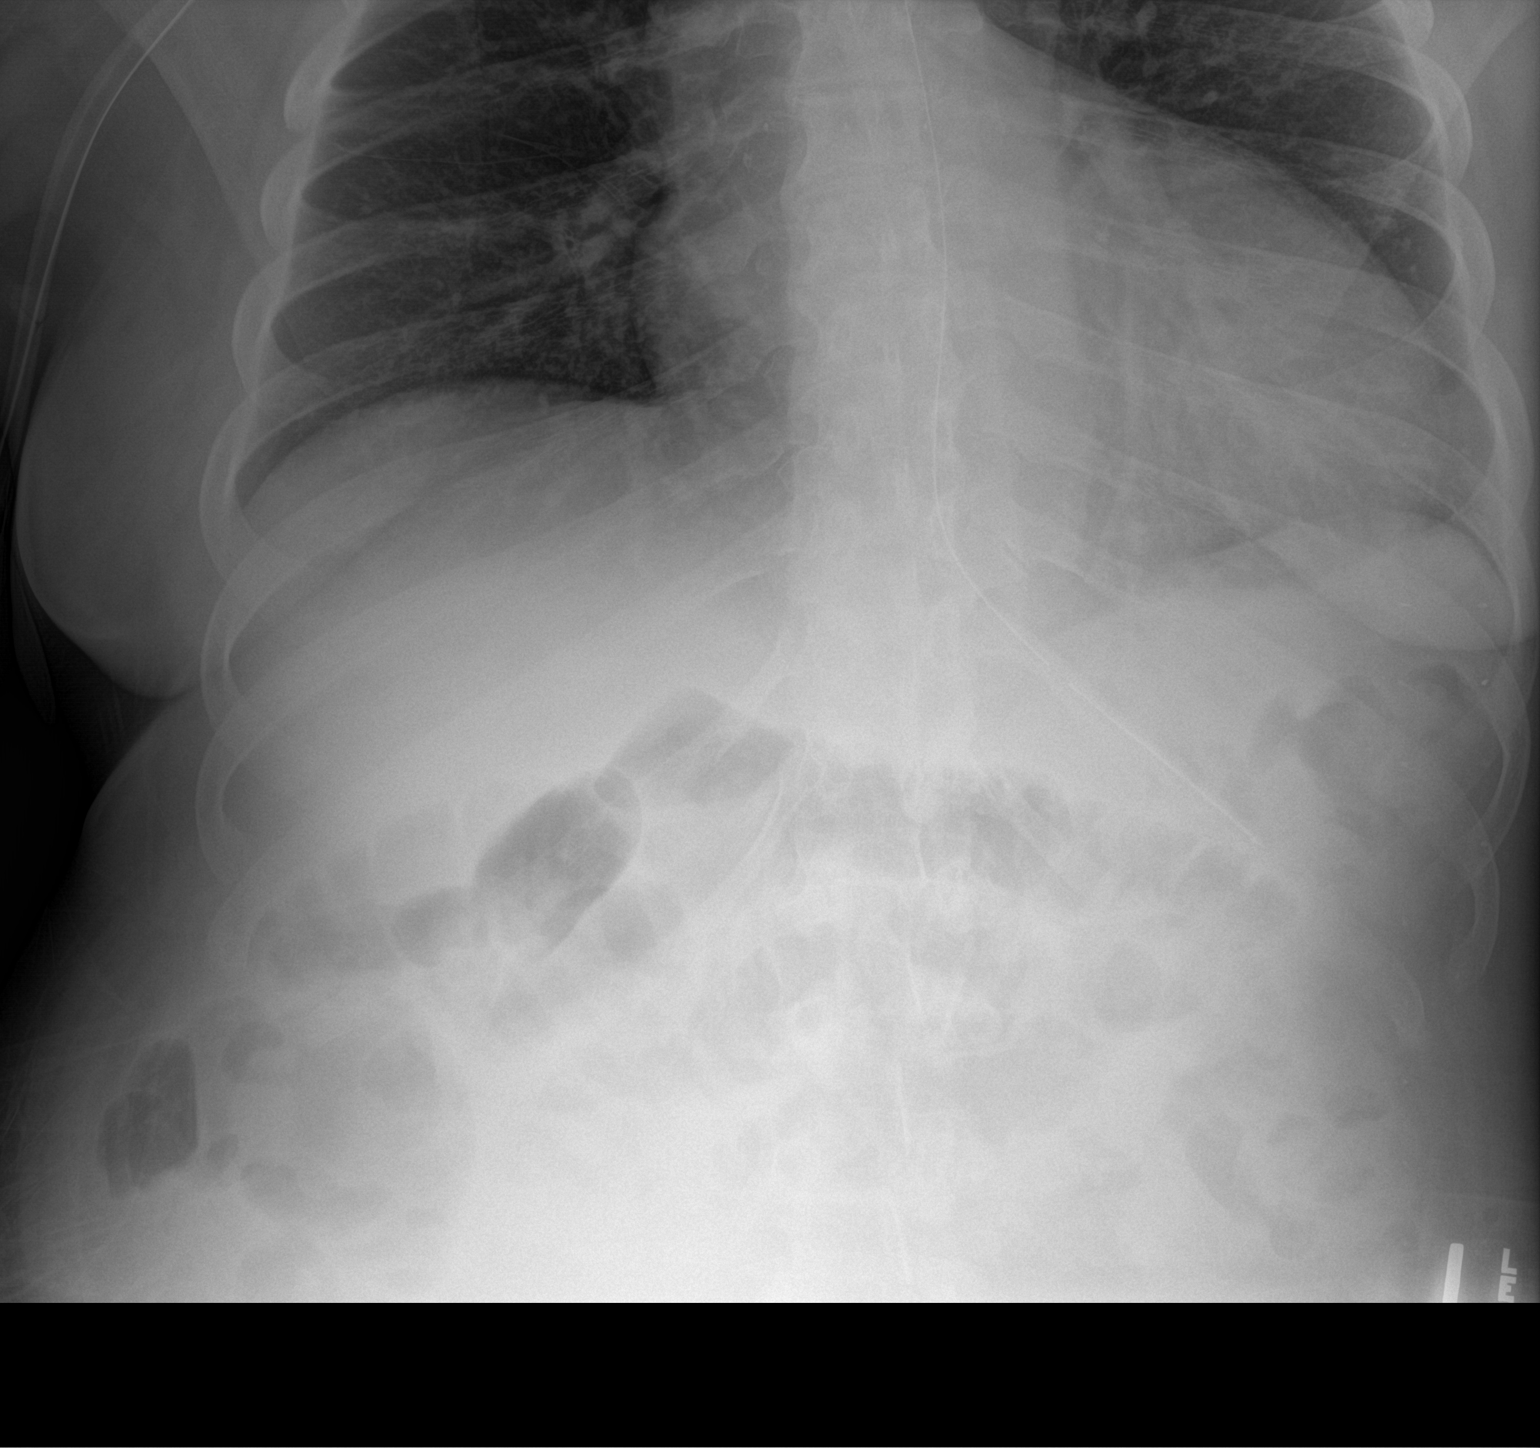

[abdomen supine]
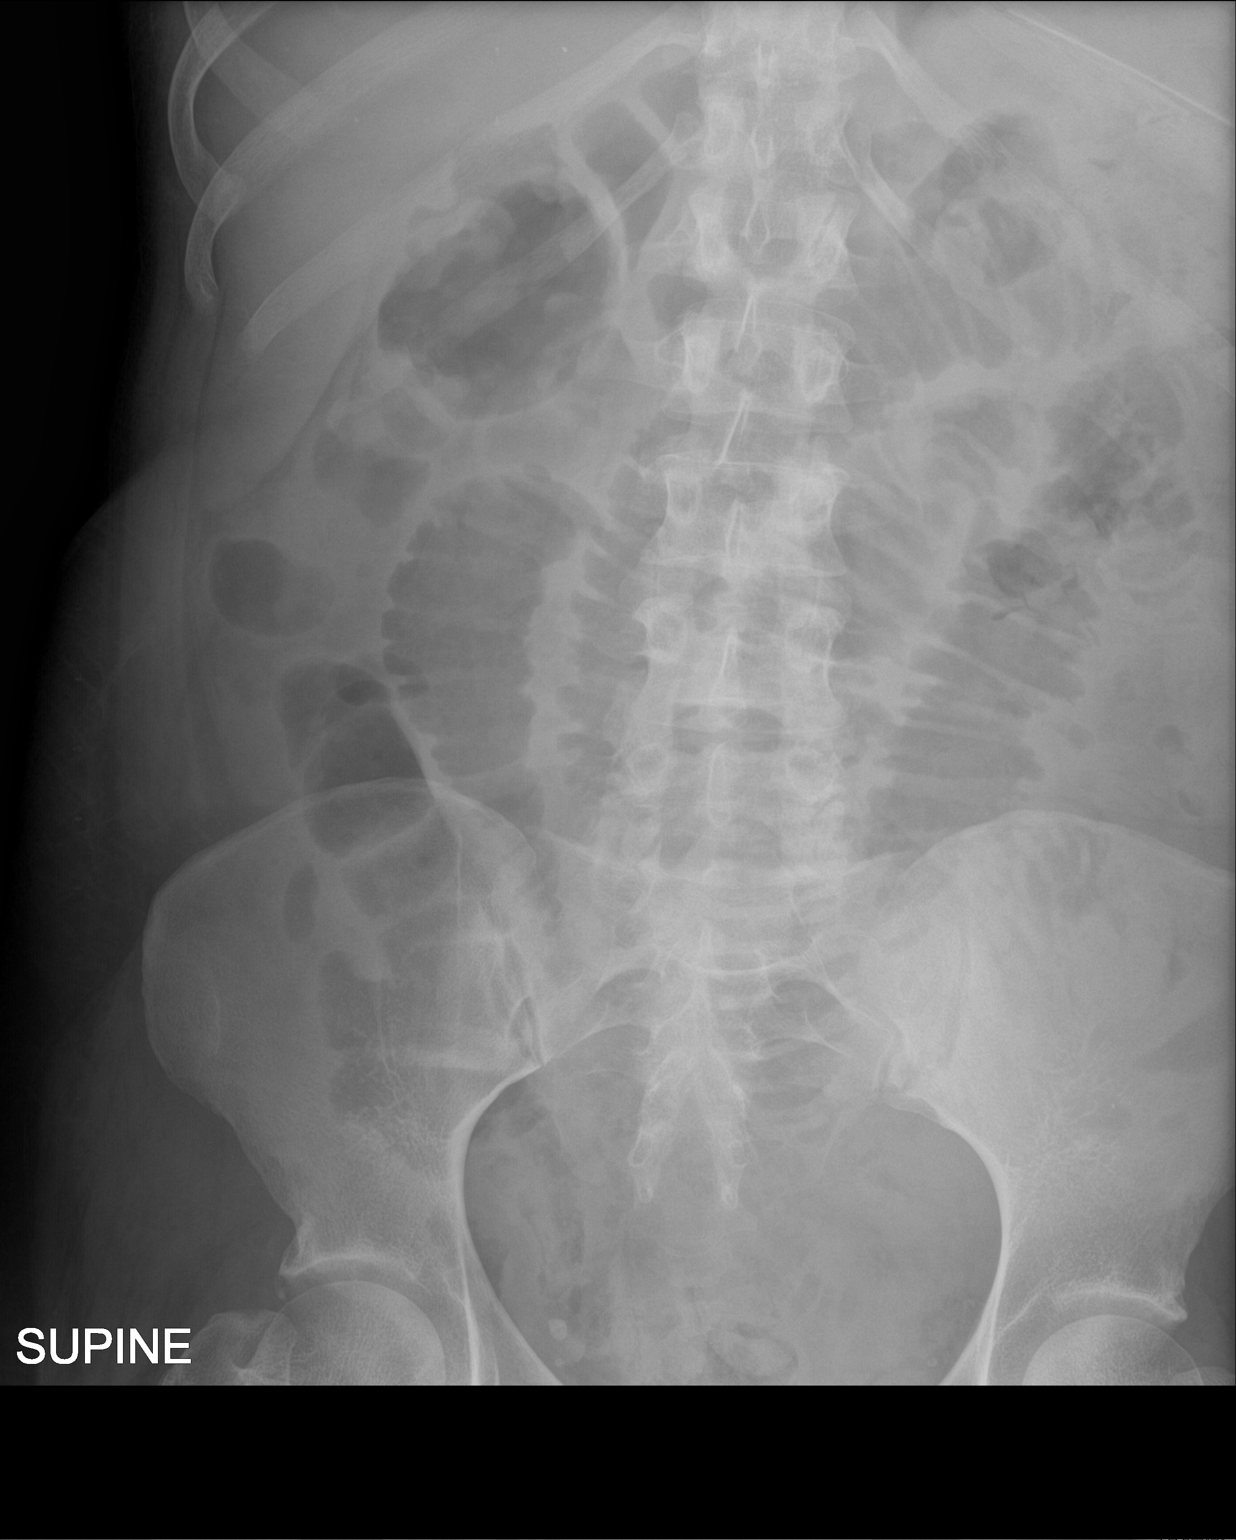

[2 of 2 positions shown; findings below may reference images not displayed]

FINDINGS: There remain loops of moderately distended gas-filled small bowel in
the mid abdomen. There is some gas in the right colon. The stomach
is nondistended. There is an esophagogastric tube present whose tip
lies in the cardia and proximal port at the GE junction. No free
extraluminal gas collections are observed. The observed bony
structures are unremarkable.
IMPRESSION: Findings compatible with an ileus or partial distal small bowel
obstruction.

Advancement of the nasogastric tube by 5-10 cm would assure that the
proximal port remains below the GE junction.

## 2018-12-09 ENCOUNTER — Other Ambulatory Visit: Payer: Self-pay

## 2018-12-09 ENCOUNTER — Emergency Department: Payer: Medicaid Other

## 2018-12-09 ENCOUNTER — Encounter: Payer: Self-pay | Admitting: Emergency Medicine

## 2018-12-09 ENCOUNTER — Emergency Department
Admission: EM | Admit: 2018-12-09 | Discharge: 2018-12-10 | Disposition: A | Discharge status: Home / Self Care | Payer: Medicaid Other | Attending: Emergency Medicine | Admitting: Emergency Medicine

## 2018-12-09 DIAGNOSIS — Z79899 Other long term (current) drug therapy: Secondary | ICD-10-CM | POA: Diagnosis not present

## 2018-12-09 DIAGNOSIS — F17210 Nicotine dependence, cigarettes, uncomplicated: Secondary | ICD-10-CM | POA: Diagnosis not present

## 2018-12-09 DIAGNOSIS — R1084 Generalized abdominal pain: Secondary | ICD-10-CM | POA: Diagnosis present

## 2018-12-09 DIAGNOSIS — Z91040 Latex allergy status: Secondary | ICD-10-CM | POA: Diagnosis not present

## 2018-12-09 DIAGNOSIS — R112 Nausea with vomiting, unspecified: Secondary | ICD-10-CM | POA: Insufficient documentation

## 2018-12-09 DIAGNOSIS — J45909 Unspecified asthma, uncomplicated: Secondary | ICD-10-CM | POA: Diagnosis not present

## 2018-12-09 LAB — CBC
HCT: 46.9 % — ABNORMAL HIGH (ref 36.0–46.0)
Hemoglobin: 15.3 g/dL — ABNORMAL HIGH (ref 12.0–15.0)
MCH: 30.4 pg (ref 26.0–34.0)
MCHC: 32.6 g/dL (ref 30.0–36.0)
MCV: 93.1 fL (ref 80.0–100.0)
Platelets: 401 10*3/uL — ABNORMAL HIGH (ref 150–400)
RBC: 5.04 MIL/uL (ref 3.87–5.11)
RDW: 13.3 % (ref 11.5–15.5)
WBC: 7.1 10*3/uL (ref 4.0–10.5)
nRBC: 0 % (ref 0.0–0.2)

## 2018-12-09 LAB — COMPREHENSIVE METABOLIC PANEL
ALT: 8 U/L (ref 0–44)
AST: 16 U/L (ref 15–41)
Albumin: 3.7 g/dL (ref 3.5–5.0)
Alkaline Phosphatase: 57 U/L (ref 38–126)
Anion gap: 9 (ref 5–15)
BUN: 8 mg/dL (ref 6–20)
CO2: 25 mmol/L (ref 22–32)
Calcium: 9.1 mg/dL (ref 8.9–10.3)
Chloride: 107 mmol/L (ref 98–111)
Creatinine, Ser: 0.69 mg/dL (ref 0.44–1.00)
GFR calc Af Amer: >60 mL/min (ref >60)
GFR calc non Af Amer: >60 mL/min (ref >60)
Glucose, Bld: 101 mg/dL — ABNORMAL HIGH (ref 70–99)
Potassium: 3.2 mmol/L — ABNORMAL LOW (ref 3.5–5.1)
Sodium: 141 mmol/L (ref 135–145)
Total Bilirubin: 0.6 mg/dL (ref 0.3–1.2)
Total Protein: 7.5 g/dL (ref 6.5–8.1)

## 2018-12-09 LAB — LIPASE, BLOOD: Lipase: 33 U/L (ref 11–51)

## 2018-12-09 MED ORDER — IOHEXOL 300 MG/ML  SOLN
100.0000 mL | Freq: Once | INTRAMUSCULAR | Status: AC | PRN
  Administered 2018-12-09: 100 mL via INTRAVENOUS

## 2018-12-09 MED ORDER — SODIUM CHLORIDE 0.9 % IV BOLUS
1000.0000 mL | Freq: Once | INTRAVENOUS | Status: AC
  Administered 2018-12-09: 1000 mL via INTRAVENOUS

## 2018-12-09 MED ORDER — IOHEXOL 300 MG/ML  SOLN
30.0000 mL | Freq: Once | INTRAMUSCULAR | Status: AC | PRN
  Administered 2018-12-09: 22:00:00 30 mL via INTRAVENOUS

## 2018-12-09 MED ORDER — MORPHINE SULFATE (PF) 4 MG/ML IV SOLN: 4.0000 mg | Freq: Once | INTRAVENOUS | Status: DC

## 2018-12-09 MED ORDER — HYDROMORPHONE HCL 1 MG/ML IJ SOLN
1.0000 mg | Freq: Once | INTRAMUSCULAR | Status: AC
  Administered 2018-12-09: 1 mg via INTRAVENOUS
  Filled 2018-12-09: qty 1

## 2018-12-09 MED ORDER — IOPAMIDOL (ISOVUE-300) INJECTION 61%: 100.0000 mL | Freq: Once | INTRAVENOUS | Status: DC | PRN

## 2018-12-09 MED ORDER — ONDANSETRON HCL 4 MG/2ML IJ SOLN
4.0000 mg | Freq: Once | INTRAMUSCULAR | Status: AC
  Administered 2018-12-09: 4 mg via INTRAVENOUS
  Filled 2018-12-09: qty 2

## 2018-12-09 NOTE — ED Notes (Signed)
EDP aware of inability to collect labs

## 2018-12-09 NOTE — ED Triage Notes (Signed)
Pt reports she was recently hospitalized with bowl obstruction. Pt back to ED tonight due to abdominal distension as well as brown emesis and BM x3 days ago.

## 2018-12-09 NOTE — ED Provider Notes (Signed)
[facility] Emergency Department Provider Note  Time seen: 10:06 PM  I have reviewed the triage vital signs and the nursing notes.   HISTORY  Chief Complaint Abdominal Pain   HPI Sarah Sosa is a 38 y.o. female with a past medical history of gastric reflux, chronic pain, hypertension, presents to the emergency department for abdominal pain.  According to the patient and record review patient was admitted to [facility] 5/10 for small bowel obstruction.  Patient was treated with a NG tube  but did not require surgery.  Patient was discharged from the hospital 3 days after admission, states she had been doing fairly well until approximately 2 days ago when the abdominal pain returned.  Patient states moderate to severe diffuse abdominal pain.  Positive for nausea and vomiting today as well.  Negative for diarrhea.  Patient states she has not been able to pass gas or stool today.  Last bowel movement 2 days ago.  Denies any fever cough congestion or shortness of breath.  Past Medical History:  Diagnosis Date  . Arthritis   . Asthma   . Chronic pain disorder 03/19/2018  . GERD (gastroesophageal reflux disease)   . Headache   . Heart murmur   . History of trichomoniasis 03/19/2018  . Hypertension   . PID (pelvic inflammatory disease) 02/14/2018  . Uterine leiomyoma 03/19/2018    Patient Active Problem List   Diagnosis Date Noted  . Ileus ([place]) 04/21/2018  . Postoperative ileus ([place]) 04/20/2018  . Postoperative state 04/15/2018  . Uterine leiomyoma 03/19/2018  . Menorrhagia with regular cycle 03/19/2018  . Chronic pain disorder 03/19/2018  . Pelvic pain 03/19/2018  . Essential hypertension 03/19/2018    Past Surgical History:  Procedure Laterality Date  . CHOLECYSTECTOMY    . DENTAL SURGERY    . HYSTERECTOMY ABDOMINAL WITH SALPINGECTOMY Bilateral 04/15/2018   Procedure: HYSTERECTOMY ABDOMINAL WITH BILATERAL SALPINGECTOMY;  Surgeon: Rubie Maid, MD;   Location: [facility] ORS;  Service: Gynecology;  Laterality: Bilateral;  . kenn surgery Bilateral   . KNEE ARTHROSCOPY Left 2012, 2013    Prior to Admission medications   Medication Sig Start Date End Date Taking? Authorizing Provider  ALPRAZolam (XANAX) 0.25 MG tablet Take 0.25 mg by mouth 2 (two) times daily.    [provider]  cyclobenzaprine (FLEXERIL) 10 MG tablet Take 1 tablet (10 mg total) by mouth 3 (three) times daily as needed for muscle spasms. 05/04/17   Darel Hong, MD  diphenhydramine-acetaminophen (TYLENOL PM) 25-500 MG TABS tablet Take 2 tablets by mouth at bedtime as needed (sleep).    [provider]  HYDROcodone-acetaminophen (NORCO/VICODIN) 5-325 MG tablet Take 1 tablet by mouth every 6 (six) hours as needed for severe pain. 10/29/18   Arta Silence, MD  lisinopril-hydrochlorothiazide (ZESTORETIC) 10-12.5 MG tablet Take 1 tablet by mouth daily. 04/23/18   Rubie Maid, MD  oxyCODONE-acetaminophen (PERCOCET) 7.5-325 MG tablet Take 1 tablet by mouth 4 (four) times daily.     [provider]  polyethylene glycol (MIRALAX) packet Take 17 g by mouth daily. Take daily for 2 weeks, then daily prn. 04/23/18   Rubie Maid, MD    Allergies  Allergen Reactions  . Latex Anaphylaxis, Hives and Itching  . Nsaids Other (See Comments)    Serve ulcers; stomach aches, GERD.   . Tomato Anaphylaxis, Hives and Itching  . Tramadol Itching    Family History  Problem Relation Age of Onset  . Fibroids Mother   . Hypertension Mother   .  Arthritis Mother     Social History Social History   Tobacco Use  . Smoking status: Current Every Day Smoker    Packs/day: 0.50    Years: 22.00    Pack years: 11.00    Types: Cigarettes  . Smokeless tobacco: Never Used  Substance Use Topics  . Alcohol use: Yes    Comment: occasional  . Drug use: Yes    Types: Marijuana    Review of Systems Constitutional: Negative for fever. Cardiovascular: Negative for  chest pain. Respiratory: Negative for shortness of breath. Gastrointestinal: Positive for abdominal pain.  Positive for nausea vomiting.  Negative for diarrhea.  Positive for constipation and obstipation. Genitourinary: Negative for urinary compaints Musculoskeletal: Negative for musculoskeletal complaints Skin: Negative for skin complaints  Neurological: Negative for headache All other ROS negative  ____________________________________________   PHYSICAL EXAM:  VITAL SIGNS: ED Triage Vitals [12/09/18 2031]  Enc Vitals Group     BP (!) 205/108     Pulse Rate 83     Resp 18     Temp 98.1 F (36.7 C)     Temp Source Oral     SpO2 100 %     Weight 180 lb (81.6 kg)     Height 5\' 6"  (1.676 m)     Head Circumference      Peak Flow      Pain Score      Pain Loc      Pain Edu?      Excl. in [place]?     Constitutional: Alert and oriented. Well appearing and in no distress. Eyes: Normal exam ENT      Head: Normocephalic and atraumatic.      Mouth/Throat: Mucous membranes are moist. Cardiovascular: Normal rate, regular rhythm Respiratory: Normal respiratory effort without tachypnea nor retractions. Breath sounds are clear  Gastrointestinal: Soft, no obvious distention.  Moderate diffuse tenderness.  No specific area of increased tenderness.  No rebound guarding o Musculoskeletal: Nontender with normal range of motion in all extremities.  Neurologic:  Normal speech and language. No gross focal neurologic deficits Skin:  Skin is warm, dry and intact.  Psychiatric: Mood and affect are normal.  ____________________________________________     RADIOLOGY  CT pending  ____________________________________________   INITIAL IMPRESSION / ASSESSMENT AND PLAN / ED COURSE  Pertinent labs & imaging results that were available during my care of the patient were reviewed by me and considered in my medical decision making (see chart for details).   Patient presents to the emergency  department for diffuse abdominal pain, history of small bowel obstruction 2 weeks ago at [facility] that resolved spontaneously.  Differential would include return of small bowel obstruction, ileus, gastroenteritis, enteritis, colitis, diverticulitis, other intra-abdominal pathology.  We will proceed with CT scan of the abdomen/pelvis.  Labs are pending.  Labs and CT are pending.  Patient care signed out to oncoming physician.  Sarah Sosa was evaluated in Emergency Department on 12/09/2018 for the symptoms described in the history of present illness. She was evaluated in the context of the global COVID-19 pandemic, which necessitated consideration that the patient might be at risk for infection with the SARS-CoV-2 virus that causes COVID-19. Institutional protocols and algorithms that pertain to the evaluation of patients at risk for COVID-19 are in a state of rapid change based on information released by regulatory bodies including the CDC and federal and state organizations. These policies and algorithms were followed during the patient's care in the ED.  ____________________________________________  FINAL CLINICAL IMPRESSION(S) / ED DIAGNOSES  Abdominal pain   Harvest Dark, MD 12/09/18 2257

## 2018-12-10 LAB — URINALYSIS, COMPLETE (UACMP) WITH MICROSCOPIC
Bacteria, UA: NONE SEEN
Bilirubin Urine: NEGATIVE
Glucose, UA: NEGATIVE mg/dL
Hgb urine dipstick: NEGATIVE
Ketones, ur: NEGATIVE mg/dL
Leukocytes,Ua: NEGATIVE
Nitrite: NEGATIVE
Protein, ur: 30 mg/dL — AB
Specific Gravity, Urine: >1.046 — ABNORMAL HIGH (ref 1.005–1.030)
pH: 6 (ref 5.0–8.0)

## 2018-12-10 MED ORDER — HYDROMORPHONE HCL 1 MG/ML IJ SOLN
INTRAMUSCULAR | Status: AC
  Filled 2018-12-10: qty 1

## 2018-12-10 MED ORDER — OXYCODONE-ACETAMINOPHEN 5-325 MG PO TABS: 1.0000 | ORAL_TABLET | ORAL | 0 refills | Status: DC | PRN

## 2018-12-10 MED ORDER — ESOMEPRAZOLE MAGNESIUM 40 MG PO CPDR: 40.0000 mg | DELAYED_RELEASE_CAPSULE | Freq: Every day | ORAL | 1 refills | Status: DC

## 2018-12-10 MED ORDER — OXYCODONE-ACETAMINOPHEN 5-325 MG PO TABS
1.0000 | ORAL_TABLET | Freq: Once | ORAL | Status: AC
Start: 2018-12-10 — End: 2018-12-10
  Administered 2018-12-10: 1 via ORAL
  Filled 2018-12-10: qty 1

## 2018-12-10 MED ORDER — HYDROMORPHONE HCL 1 MG/ML IJ SOLN
1.0000 mg | Freq: Once | INTRAMUSCULAR | Status: AC
  Administered 2018-12-10: 1 mg via INTRAVENOUS

## 2018-12-10 NOTE — Discharge Instructions (Addendum)
Lab work urine and CT are normal.  Not quite sure what is causing her abdominal pain but there is nothing more that I can do about it tonight.  I will give you some pain medicine to go home with.  Please follow-up with your regular doctor or you can follow-up with the surgeon.  Please also make sure your regular doctor checks on your blood pressure.  I understand it is always high when you are in pain but it is quite high now.  He is be sure to drink more water.  Your urine is concentrated.  You could develop kidney stones if you do not drink enough fluids.  Nexium 1 a day.  If it is too expensive have the pharmacy call us while you are there we can try something else.  Remember to come back if you begin vomiting brown material again if you vomit any blood.

## 2018-12-10 NOTE — ED Provider Notes (Addendum)
Patient CT is negative her lab work is within normal limits urine is clear patient reports her blood pressure is always very high when she has pain.  She is complaining of pain now although not sure why.  I have given her some pain medicine.  I will give her some more to go home with.  I will advise her to drink more as her urine is fairly concentrated.   Nena Polio, MD 12/10/18 0300 Patient reports she had been vomiting up brown material.  She has not vomited during her whole stay here however.  Has been here almost 7 hours.  I will give her some Nexium as well.  Advised her to come back if she vomits any more brown material or if she vomits anything that looks like blood.  Any red blood that is.  Patient's abdominal pain is not epigastric is diffuse her abdomen is soft.   Nena Polio, MD 12/10/18 [phone]

## 2019-02-11 ENCOUNTER — Other Ambulatory Visit: Payer: Self-pay

## 2019-02-11 ENCOUNTER — Emergency Department: Payer: Medicaid Other

## 2019-02-11 ENCOUNTER — Emergency Department
Admission: EM | Admit: 2019-02-11 | Discharge: 2019-02-11 | Disposition: A | Discharge status: Home / Self Care | Payer: Medicaid Other | Attending: Emergency Medicine | Admitting: Emergency Medicine

## 2019-02-11 DIAGNOSIS — S8012XA Contusion of left lower leg, initial encounter: Secondary | ICD-10-CM | POA: Diagnosis not present

## 2019-02-11 DIAGNOSIS — I10 Essential (primary) hypertension: Secondary | ICD-10-CM | POA: Insufficient documentation

## 2019-02-11 DIAGNOSIS — Z79899 Other long term (current) drug therapy: Secondary | ICD-10-CM | POA: Diagnosis not present

## 2019-02-11 DIAGNOSIS — Y92810 Car as the place of occurrence of the external cause: Secondary | ICD-10-CM | POA: Diagnosis not present

## 2019-02-11 DIAGNOSIS — F1210 Cannabis abuse, uncomplicated: Secondary | ICD-10-CM | POA: Diagnosis not present

## 2019-02-11 DIAGNOSIS — J45909 Unspecified asthma, uncomplicated: Secondary | ICD-10-CM | POA: Insufficient documentation

## 2019-02-11 DIAGNOSIS — F17210 Nicotine dependence, cigarettes, uncomplicated: Secondary | ICD-10-CM | POA: Diagnosis not present

## 2019-02-11 DIAGNOSIS — G44319 Acute post-traumatic headache, not intractable: Secondary | ICD-10-CM | POA: Insufficient documentation

## 2019-02-11 DIAGNOSIS — W230XXA Caught, crushed, jammed, or pinched between moving objects, initial encounter: Secondary | ICD-10-CM | POA: Diagnosis not present

## 2019-02-11 DIAGNOSIS — S8992XA Unspecified injury of left lower leg, initial encounter: Secondary | ICD-10-CM | POA: Diagnosis present

## 2019-02-11 DIAGNOSIS — Y9389 Activity, other specified: Secondary | ICD-10-CM | POA: Diagnosis not present

## 2019-02-11 DIAGNOSIS — Y998 Other external cause status: Secondary | ICD-10-CM | POA: Diagnosis not present

## 2019-02-11 DIAGNOSIS — S93402A Sprain of unspecified ligament of left ankle, initial encounter: Secondary | ICD-10-CM | POA: Diagnosis not present

## 2019-02-11 DIAGNOSIS — Z91040 Latex allergy status: Secondary | ICD-10-CM | POA: Insufficient documentation

## 2019-02-11 MED ORDER — ACETAMINOPHEN 500 MG PO TABS
1000.0000 mg | ORAL_TABLET | Freq: Once | ORAL | Status: AC
  Administered 2019-02-11: 08:00:00 1000 mg via ORAL

## 2019-02-11 MED ORDER — OXYCODONE-ACETAMINOPHEN 5-325 MG PO TABS
1.0000 | ORAL_TABLET | ORAL | Status: DC | PRN
  Administered 2019-02-11: 05:00:00 1 via ORAL
  Filled 2019-02-11: qty 1

## 2019-02-11 MED ORDER — ACETAMINOPHEN 500 MG PO TABS: 1000.0000 mg | ORAL_TABLET | Freq: Three times a day (TID) | ORAL | 2 refills | Status: DC | PRN

## 2019-02-11 MED ORDER — ACETAMINOPHEN 500 MG PO TABS
ORAL_TABLET | ORAL | Status: AC
  Administered 2019-02-11: 08:00:00 1000 mg via ORAL
  Filled 2019-02-11: qty 2
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
## 2019-03-24 DIAGNOSIS — R1030 Lower abdominal pain, unspecified: Secondary | ICD-10-CM | POA: Diagnosis present

## 2019-03-24 DIAGNOSIS — F17210 Nicotine dependence, cigarettes, uncomplicated: Secondary | ICD-10-CM | POA: Insufficient documentation

## 2019-03-24 DIAGNOSIS — Z79899 Other long term (current) drug therapy: Secondary | ICD-10-CM | POA: Insufficient documentation

## 2019-03-24 DIAGNOSIS — E876 Hypokalemia: Secondary | ICD-10-CM | POA: Diagnosis not present

## 2019-03-24 DIAGNOSIS — I10 Essential (primary) hypertension: Secondary | ICD-10-CM | POA: Insufficient documentation

## 2019-03-24 DIAGNOSIS — R1084 Generalized abdominal pain: Secondary | ICD-10-CM | POA: Insufficient documentation

## 2019-03-24 DIAGNOSIS — E860 Dehydration: Secondary | ICD-10-CM | POA: Insufficient documentation
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
## 2019-03-25 ENCOUNTER — Other Ambulatory Visit: Payer: Self-pay

## 2019-03-25 ENCOUNTER — Emergency Department: Payer: Medicaid Other

## 2019-03-25 ENCOUNTER — Encounter: Payer: Self-pay | Admitting: Emergency Medicine

## 2019-03-25 ENCOUNTER — Emergency Department
Admission: EM | Admit: 2019-03-25 | Discharge: 2019-03-25 | Disposition: A | Discharge status: Home / Self Care | Payer: Medicaid Other | Attending: Emergency Medicine | Admitting: Emergency Medicine

## 2019-03-25 DIAGNOSIS — N3000 Acute cystitis without hematuria: Secondary | ICD-10-CM

## 2019-03-25 DIAGNOSIS — E860 Dehydration: Secondary | ICD-10-CM

## 2019-03-25 DIAGNOSIS — E876 Hypokalemia: Secondary | ICD-10-CM

## 2019-03-25 DIAGNOSIS — R1084 Generalized abdominal pain: Secondary | ICD-10-CM

## 2019-03-25 LAB — URINALYSIS, COMPLETE (UACMP) WITH MICROSCOPIC
Bilirubin Urine: NEGATIVE
Glucose, UA: NEGATIVE mg/dL
Ketones, ur: NEGATIVE mg/dL
Nitrite: POSITIVE — AB
Protein, ur: NEGATIVE mg/dL
Specific Gravity, Urine: 1.011 (ref 1.005–1.030)
WBC, UA: >50 WBC/hpf — ABNORMAL HIGH (ref 0–5)
pH: 6 (ref 5.0–8.0)

## 2019-03-25 LAB — COMPREHENSIVE METABOLIC PANEL
ALT: 11 U/L (ref 0–44)
AST: 23 U/L (ref 15–41)
Albumin: 3.7 g/dL (ref 3.5–5.0)
Alkaline Phosphatase: 59 U/L (ref 38–126)
Anion gap: 12 (ref 5–15)
BUN: 7 mg/dL (ref 6–20)
CO2: 24 mmol/L (ref 22–32)
Calcium: 9 mg/dL (ref 8.9–10.3)
Chloride: 105 mmol/L (ref 98–111)
Creatinine, Ser: 0.84 mg/dL (ref 0.44–1.00)
GFR calc Af Amer: >60 mL/min (ref >60)
GFR calc non Af Amer: >60 mL/min (ref >60)
Glucose, Bld: 148 mg/dL — ABNORMAL HIGH (ref 70–99)
Potassium: 2.8 mmol/L — ABNORMAL LOW (ref 3.5–5.1)
Sodium: 141 mmol/L (ref 135–145)
Total Bilirubin: 0.5 mg/dL (ref 0.3–1.2)
Total Protein: 7.4 g/dL (ref 6.5–8.1)

## 2019-03-25 LAB — CBC
HCT: 40.6 % (ref 36.0–46.0)
Hemoglobin: 13.7 g/dL (ref 12.0–15.0)
MCH: 31 pg (ref 26.0–34.0)
MCHC: 33.7 g/dL (ref 30.0–36.0)
MCV: 91.9 fL (ref 80.0–100.0)
Platelets: 312 10*3/uL (ref 150–400)
RBC: 4.42 MIL/uL (ref 3.87–5.11)
RDW: 12.2 % (ref 11.5–15.5)
WBC: 5.5 10*3/uL (ref 4.0–10.5)
nRBC: 0 % (ref 0.0–0.2)

## 2019-03-25 LAB — LIPASE, BLOOD: Lipase: 34 U/L (ref 11–51)

## 2019-03-25 MED ORDER — LACTATED RINGERS IV BOLUS
1000.0000 mL | Freq: Once | INTRAVENOUS | Status: AC
  Administered 2019-03-25: 1000 mL via INTRAVENOUS

## 2019-03-25 MED ORDER — DICYCLOMINE HCL 10 MG PO CAPS
20.0000 mg | ORAL_CAPSULE | Freq: Once | ORAL | Status: AC
  Administered 2019-03-25: 20 mg via ORAL
  Filled 2019-03-25: qty 2

## 2019-03-25 MED ORDER — HYDROMORPHONE HCL 1 MG/ML IJ SOLN
1.0000 mg | Freq: Once | INTRAMUSCULAR | Status: AC
  Administered 2019-03-25: 1 mg via INTRAVENOUS
  Filled 2019-03-25: qty 1

## 2019-03-25 MED ORDER — OXYCODONE HCL 5 MG PO TABS: 5.0000 mg | ORAL_TABLET | Freq: Three times a day (TID) | ORAL | 0 refills | Status: DC | PRN

## 2019-03-25 MED ORDER — SODIUM CHLORIDE 0.9 % IV SOLN
2.0000 g | Freq: Once | INTRAVENOUS | Status: AC
  Administered 2019-03-25: 2 g via INTRAVENOUS
  Filled 2019-03-25: qty 20

## 2019-03-25 MED ORDER — POTASSIUM CHLORIDE 10 MEQ/100ML IV SOLN: 10.0000 meq | INTRAVENOUS | Status: DC

## 2019-03-25 MED ORDER — HYDROMORPHONE HCL 1 MG/ML IJ SOLN
1.0000 mg | Freq: Once | INTRAMUSCULAR | Status: AC
  Administered 2019-03-25: 06:00:00 1 mg via INTRAVENOUS
  Filled 2019-03-25: qty 1

## 2019-03-25 MED ORDER — POTASSIUM CHLORIDE CRYS ER 20 MEQ PO TBCR
20.0000 meq | EXTENDED_RELEASE_TABLET | Freq: Once | ORAL | Status: DC
  Filled 2019-03-25: qty 1

## 2019-03-25 MED ORDER — POTASSIUM CHLORIDE ER 10 MEQ PO TBCR: 20.0000 meq | EXTENDED_RELEASE_TABLET | Freq: Two times a day (BID) | ORAL | 0 refills | Status: DC

## 2019-03-25 MED ORDER — POTASSIUM CHLORIDE CRYS ER 20 MEQ PO TBCR
40.0000 meq | EXTENDED_RELEASE_TABLET | Freq: Once | ORAL | Status: AC
  Administered 2019-03-25: 40 meq via ORAL
  Filled 2019-03-25: qty 2

## 2019-03-25 MED ORDER — DICYCLOMINE HCL 20 MG PO TABS: 20.0000 mg | ORAL_TABLET | Freq: Three times a day (TID) | ORAL | 0 refills | Status: DC | PRN

## 2019-03-25 MED ORDER — ONDANSETRON HCL 4 MG/2ML IJ SOLN
4.0000 mg | Freq: Once | INTRAMUSCULAR | Status: AC
  Administered 2019-03-25: 4 mg via INTRAVENOUS
  Filled 2019-03-25: qty 2

## 2019-03-25 MED ORDER — IOHEXOL 300 MG/ML  SOLN
100.0000 mL | Freq: Once | INTRAMUSCULAR | Status: AC | PRN
  Administered 2019-03-25: 100 mL via INTRAVENOUS

## 2019-03-25 MED ORDER — CEPHALEXIN 500 MG PO CAPS: 500.0000 mg | ORAL_CAPSULE | Freq: Three times a day (TID) | ORAL | 0 refills | Status: AC

## 2019-03-25 MED ORDER — ONDANSETRON 4 MG PO TBDP: 4.0000 mg | ORAL_TABLET | Freq: Three times a day (TID) | ORAL | 0 refills | Status: DC | PRN

## 2019-03-25 NOTE — ED Provider Notes (Signed)
[facility] Emergency Department Provider Note  ____________________________________________   First MD Initiated Contact with Patient 03/25/19 [phone]     (approximate)  I have reviewed the triage vital signs and the nursing notes.   HISTORY  Chief Complaint Abdominal Pain    HPI Sarah Sosa is a 38 y.o. female here with lower abdominal pain.  The patient has a history of chronic abdominal pain, but states that her current pain is very different from this.  She reports aching, throbbing, cramp-like, suprapubic lower abdominal pain with associated nausea.  She has had associated abdominal distention and subjective swelling.  She is had some occasional vomiting.  She feels like her symptoms are somewhat similar to her previous bowel obstructions.  She denies any diarrhea but instead has constipation.  No flank pain.  No known fevers or chills.  No cough.  No shortness of breath.        Past Medical History:  Diagnosis Date   Arthritis    Asthma    Chronic pain disorder 03/19/2018   GERD (gastroesophageal reflux disease)    Headache    Heart murmur    History of trichomoniasis 03/19/2018   Hypertension    PID (pelvic inflammatory disease) 02/14/2018   Uterine leiomyoma 03/19/2018    Patient Active Problem List   Diagnosis Date Noted   Ileus ([place]) 04/21/2018   Postoperative ileus ([place]) 04/20/2018   Postoperative state 04/15/2018   Uterine leiomyoma 03/19/2018   Menorrhagia with regular cycle 03/19/2018   Chronic pain disorder 03/19/2018   Pelvic pain 03/19/2018   Essential hypertension 03/19/2018    Past Surgical History:  Procedure Laterality Date   ABDOMINAL HYSTERECTOMY     CHOLECYSTECTOMY     DENTAL SURGERY     HYSTERECTOMY ABDOMINAL WITH SALPINGECTOMY Bilateral 04/15/2018   Procedure: HYSTERECTOMY ABDOMINAL WITH BILATERAL SALPINGECTOMY;  Surgeon: Rubie Maid, MD;  Location: [facility] ORS;  Service: Gynecology;  Laterality:  Bilateral;   kenn surgery Bilateral    KNEE ARTHROSCOPY Left 2012, 2013    Prior to Admission medications   Medication Sig Start Date End Date Taking? Authorizing Provider  acetaminophen (TYLENOL) 500 MG tablet Take 2 tablets (1,000 mg total) by mouth every 8 (eight) hours as needed. 02/11/19 02/11/20  Lavonia Drafts, MD  ALPRAZolam Duanne Moron) 0.25 MG tablet Take 0.25 mg by mouth 2 (two) times daily.    [provider]  cephALEXin (KEFLEX) 500 MG capsule Take 1 capsule (500 mg total) by mouth 3 (three) times daily for 7 days. 03/25/19 04/01/19  Duffy Bruce, MD  cyclobenzaprine (FLEXERIL) 10 MG tablet Take 1 tablet (10 mg total) by mouth 3 (three) times daily as needed for muscle spasms. 05/04/17   Darel Hong, MD  dicyclomine (BENTYL) 20 MG tablet Take 1 tablet (20 mg total) by mouth 3 (three) times daily as needed for spasms. 03/25/19 03/24/20  Duffy Bruce, MD  diphenhydramine-acetaminophen (TYLENOL PM) 25-500 MG TABS tablet Take 2 tablets by mouth at bedtime as needed (sleep).    [provider]  esomeprazole (NEXIUM) 40 MG capsule Take 1 capsule (40 mg total) by mouth daily. 12/10/18 12/10/19  Nena Polio, MD  HYDROcodone-acetaminophen (NORCO/VICODIN) 5-325 MG tablet Take 1 tablet by mouth every 6 (six) hours as needed for severe pain. 10/29/18   Arta Silence, MD  lisinopril-hydrochlorothiazide (ZESTORETIC) 10-12.5 MG tablet Take 1 tablet by mouth daily. 04/23/18   Rubie Maid, MD  ondansetron (ZOFRAN ODT) 4 MG disintegrating tablet Take 1 tablet (4 mg total)  by mouth every 8 (eight) hours as needed for nausea or vomiting. 03/25/19   Duffy Bruce, MD  oxyCODONE (ROXICODONE) 5 MG immediate release tablet Take 1 tablet (5 mg total) by mouth every 8 (eight) hours as needed for breakthrough pain. 03/25/19 03/24/20  Duffy Bruce, MD  oxyCODONE-acetaminophen (PERCOCET) 5-325 MG tablet Take 1 tablet by mouth every 4 (four) hours as needed for severe pain. 12/10/18 12/10/19   Nena Polio, MD  oxyCODONE-acetaminophen (PERCOCET) 7.5-325 MG tablet Take 1 tablet by mouth 4 (four) times daily.     [provider]  polyethylene glycol (MIRALAX) packet Take 17 g by mouth daily. Take daily for 2 weeks, then daily prn. 04/23/18   Rubie Maid, MD  potassium chloride (K-DUR) 10 MEQ tablet Take 2 tablets (20 mEq total) by mouth 2 (two) times daily for 3 days. 03/25/19 03/28/19  Duffy Bruce, MD    Allergies Latex, Nsaids, Tomato, and Tramadol  Family History  Problem Relation Age of Onset   Fibroids Mother    Hypertension Mother    Arthritis Mother     Social History Social History   Tobacco Use   Smoking status: Current Some Day Smoker    Packs/day: 0.50    Years: 22.00    Pack years: 11.00    Types: Cigarettes   Smokeless tobacco: Never Used  Substance Use Topics   Alcohol use: Yes    Comment: occasional   Drug use: Yes    Types: Marijuana    Review of Systems  Review of Systems  Constitutional: Positive for fatigue. Negative for fever.  HENT: Negative for congestion and sore throat.   Eyes: Negative for visual disturbance.  Respiratory: Negative for cough and shortness of breath.   Cardiovascular: Negative for chest pain.  Gastrointestinal: Positive for abdominal pain, nausea and vomiting. Negative for diarrhea.  Genitourinary: Positive for dysuria and frequency. Negative for flank pain.  Musculoskeletal: Negative for back pain and neck pain.  Skin: Negative for rash and wound.  Neurological: Negative for weakness.  All other systems reviewed and are negative.    ____________________________________________  PHYSICAL EXAM:      VITAL SIGNS: ED Triage Vitals  Enc Vitals Group     BP 03/24/19 2352 (!) 176/96     Pulse Rate 03/24/19 2352 76     Resp 03/24/19 2352 18     Temp 03/24/19 2352 98.7 F (37.1 C)     Temp Source 03/24/19 2352 Oral     SpO2 03/24/19 2352 100 %     Weight 03/25/19 0001 190 lb (86.2 kg)      Height 03/25/19 0001 5\' 6"  (1.676 m)     Head Circumference --      Peak Flow --      Pain Score 03/25/19 0000 10     Pain Loc --      Pain Edu? --      Excl. in [place]? --      Physical Exam Vitals signs and nursing note reviewed.  Constitutional:      General: She is not in acute distress.    Appearance: She is well-developed.  HENT:     Head: Normocephalic and atraumatic.  Eyes:     Conjunctiva/sclera: Conjunctivae normal.  Neck:     Musculoskeletal: Neck supple.  Cardiovascular:     Rate and Rhythm: Normal rate and regular rhythm.     Heart sounds: Normal heart sounds. No murmur. No friction rub.  Pulmonary:  Effort: Pulmonary effort is normal. No respiratory distress.     Breath sounds: Normal breath sounds. No wheezing or rales.  Abdominal:     General: There is no distension.     Palpations: Abdomen is soft.     Tenderness: There is abdominal tenderness in the epigastric area and suprapubic area. There is guarding. There is no rebound.  Skin:    General: Skin is warm.     Capillary Refill: Capillary refill takes less than 2 seconds.  Neurological:     Mental Status: She is alert and oriented to person, place, and time.     Motor: No abnormal muscle tone.       ____________________________________________   LABS (all labs ordered are listed, but only abnormal results are displayed)  Labs Reviewed  COMPREHENSIVE METABOLIC PANEL - Abnormal; Notable for the following components:      Result Value   Potassium 2.8 (*)    Glucose, Bld 148 (*)    All other components within normal limits  URINALYSIS, COMPLETE (UACMP) WITH MICROSCOPIC - Abnormal; Notable for the following components:   Color, Urine YELLOW (*)    APPearance CLOUDY (*)    Hgb urine dipstick SMALL (*)    Nitrite POSITIVE (*)    Leukocytes,Ua MODERATE (*)    WBC, UA >50 (*)    Bacteria, UA MANY (*)    All other components within normal limits  URINE CULTURE  LIPASE, BLOOD  CBC     ____________________________________________  EKG: None ________________________________________  RADIOLOGY All imaging, including plain films, CT scans, and ultrasounds, independently reviewed by me, and interpretations confirmed via formal radiology reads.  ED MD interpretation:   CT: No acute normality  Official radiology report(s): Ct Abdomen Pelvis W Contrast  Result Date: 03/25/2019 CLINICAL DATA:  Lower abdominal pain and bloating worsening over the past 4 days EXAM: CT ABDOMEN AND PELVIS WITH CONTRAST TECHNIQUE: Multidetector CT imaging of the abdomen and pelvis was performed using the standard protocol following bolus administration of intravenous contrast. CONTRAST:  155mL OMNIPAQUE IOHEXOL 300 MG/ML  SOLN COMPARISON:  12/09/2018 FINDINGS: Lower chest: No acute abnormality. Hepatobiliary: No focal liver abnormality is seen. Status post cholecystectomy. No biliary dilatation. Pancreas: Unremarkable. No pancreatic ductal dilatation or surrounding inflammatory changes. Spleen: Normal in size without focal abnormality. Adrenals/Urinary Tract: Adrenal glands are unremarkable. Kidneys are normal, without renal calculi, focal lesion, or hydronephrosis. Bladder is unremarkable. Stomach/Bowel: Stomach is within normal limits. No evidence of bowel wall thickening, distention, or inflammatory changes. Moderate amount of stool in the ascending and transverse colon. Vascular/Lymphatic: Normal caliber abdominal aorta with mild atherosclerosis. No lymphadenopathy. Reproductive: Status post hysterectomy. No adnexal masses. Other: No abdominal wall hernia or abnormality. No abdominopelvic ascites. Musculoskeletal: No acute osseous abnormality. No aggressive osseous lesion. Mild osteoarthritis of bilateral sacroiliac joints. Bilateral facet arthropathy at L4-5 and L5-S1. IMPRESSION: No acute abdominal or pelvic pathology. Aortic Atherosclerosis (ICD10-I70.0). Electronically Signed   By: Kathreen Devoid   On:  03/25/2019 06:19    ____________________________________________  PROCEDURES   Procedure(s) performed (including Critical Care):  Procedures  ____________________________________________  INITIAL IMPRESSION / MDM / [place] / ED COURSE  As part of my medical decision making, I reviewed the following data within the electronic MEDICAL RECORD NUMBER Notes from prior ED visits and [place] Controlled Substance Database      *Linell Wheeland was evaluated in Emergency Department on 03/25/2019 for the symptoms described in the history of present illness. She was evaluated in the  context of the global COVID-19 pandemic, which necessitated consideration that the patient might be at risk for infection with the SARS-CoV-2 virus that causes COVID-19. Institutional protocols and algorithms that pertain to the evaluation of patients at risk for COVID-19 are in a state of rapid change based on information released by regulatory bodies including the CDC and federal and state organizations. These policies and algorithms were followed during the patient's care in the ED.  Some ED evaluations and interventions may be delayed as a result of limited staffing during the pandemic.*      Medical Decision Making: 38 year old female here with generalized abdominal pain.  Lab work shows likely UTI which does explain some of her suprapubic pain and cramping.  Suspect there is also a component of chronic abdominal pain versus IBS.  Her CT scan shows no acute normality.  Lab work is reassuring with the exception of mild hypokalemia, which is a chronic issue for her.  She has tolerated multiple doses of p.o. replacement here.  No arrhythmia on telemetry.  Will treat her UTI, discharge with supportive care.  Brief course of analgesics given.  ____________________________________________  FINAL CLINICAL IMPRESSION(S) / ED DIAGNOSES  Final diagnoses:  Hypokalemia  Dehydration  Generalized abdominal pain      MEDICATIONS GIVEN DURING THIS VISIT:  Medications  potassium chloride SA (K-DUR) CR tablet 20 mEq (has no administration in time range)  dicyclomine (BENTYL) capsule 20 mg (has no administration in time range)  HYDROmorphone (DILAUDID) injection 1 mg (has no administration in time range)  cefTRIAXone (ROCEPHIN) 2 g in sodium chloride 0.9 % 100 mL IVPB (0 g Intravenous Stopped 03/25/19 0618)  lactated ringers bolus 1,000 mL (1,000 mLs Intravenous New Bag/Given 03/25/19 0547)  potassium chloride SA (K-DUR) CR tablet 40 mEq (40 mEq Oral Given 03/25/19 0543)  ondansetron (ZOFRAN) injection 4 mg (4 mg Intravenous Given 03/25/19 0547)  HYDROmorphone (DILAUDID) injection 1 mg (1 mg Intravenous Given 03/25/19 0547)  iohexol (OMNIPAQUE) 300 MG/ML solution 100 mL (100 mLs Intravenous Contrast Given 03/25/19 0559)     ED Discharge Orders         Ordered    cephALEXin (KEFLEX) 500 MG capsule  3 times daily     03/25/19 0707    potassium chloride (K-DUR) 10 MEQ tablet  2 times daily     03/25/19 0707    ondansetron (ZOFRAN ODT) 4 MG disintegrating tablet  Every 8 hours PRN     03/25/19 0707    dicyclomine (BENTYL) 20 MG tablet  3 times daily PRN     03/25/19 0707    oxyCODONE (ROXICODONE) 5 MG immediate release tablet  Every 8 hours PRN     03/25/19 0708           Note:  This document was prepared using Dragon voice recognition software and may include unintentional dictation errors.   Duffy Bruce, MD 03/25/19 [phone]

## 2019-03-25 NOTE — ED Notes (Signed)
Patient ambulatory to lobby with steady gait and NAD noted. Verbalized understanding of discharge instructions and follow-up care.  

## 2019-03-27 ENCOUNTER — Emergency Department: Payer: Medicaid Other

## 2019-03-27 ENCOUNTER — Encounter: Payer: Self-pay | Admitting: Emergency Medicine

## 2019-03-27 ENCOUNTER — Emergency Department
Admission: EM | Admit: 2019-03-27 | Discharge: 2019-03-27 | Disposition: A | Discharge status: Home / Self Care | Payer: Medicaid Other | Attending: Emergency Medicine | Admitting: Emergency Medicine

## 2019-03-27 ENCOUNTER — Other Ambulatory Visit: Payer: Self-pay

## 2019-03-27 DIAGNOSIS — Z91040 Latex allergy status: Secondary | ICD-10-CM | POA: Insufficient documentation

## 2019-03-27 DIAGNOSIS — I10 Essential (primary) hypertension: Secondary | ICD-10-CM | POA: Diagnosis not present

## 2019-03-27 DIAGNOSIS — K5900 Constipation, unspecified: Secondary | ICD-10-CM | POA: Diagnosis not present

## 2019-03-27 DIAGNOSIS — J45909 Unspecified asthma, uncomplicated: Secondary | ICD-10-CM | POA: Diagnosis not present

## 2019-03-27 DIAGNOSIS — R1084 Generalized abdominal pain: Secondary | ICD-10-CM | POA: Diagnosis not present

## 2019-03-27 DIAGNOSIS — F17210 Nicotine dependence, cigarettes, uncomplicated: Secondary | ICD-10-CM | POA: Diagnosis not present

## 2019-03-27 LAB — URINALYSIS, COMPLETE (UACMP) WITH MICROSCOPIC
Bilirubin Urine: NEGATIVE
Glucose, UA: NEGATIVE mg/dL
Hgb urine dipstick: NEGATIVE
Ketones, ur: NEGATIVE mg/dL
Leukocytes,Ua: NEGATIVE
Nitrite: NEGATIVE
Protein, ur: NEGATIVE mg/dL
Specific Gravity, Urine: 1.015 (ref 1.005–1.030)
pH: 7 (ref 5.0–8.0)

## 2019-03-27 LAB — COMPREHENSIVE METABOLIC PANEL
ALT: 10 U/L (ref 0–44)
AST: 21 U/L (ref 15–41)
Albumin: 3.5 g/dL (ref 3.5–5.0)
Alkaline Phosphatase: 58 U/L (ref 38–126)
Anion gap: 9 (ref 5–15)
BUN: 9 mg/dL (ref 6–20)
CO2: 25 mmol/L (ref 22–32)
Calcium: 8.5 mg/dL — ABNORMAL LOW (ref 8.9–10.3)
Chloride: 105 mmol/L (ref 98–111)
Creatinine, Ser: 0.8 mg/dL (ref 0.44–1.00)
GFR calc Af Amer: >60 mL/min (ref >60)
GFR calc non Af Amer: >60 mL/min (ref >60)
Glucose, Bld: 112 mg/dL — ABNORMAL HIGH (ref 70–99)
Potassium: 3.4 mmol/L — ABNORMAL LOW (ref 3.5–5.1)
Sodium: 139 mmol/L (ref 135–145)
Total Bilirubin: 0.3 mg/dL (ref 0.3–1.2)
Total Protein: 6.9 g/dL (ref 6.5–8.1)

## 2019-03-27 LAB — CBC WITH DIFFERENTIAL/PLATELET
Abs Immature Granulocytes: 0.01 10*3/uL (ref 0.00–0.07)
Basophils Absolute: 0.1 10*3/uL (ref 0.0–0.1)
Basophils Relative: 1 %
Eosinophils Absolute: 0.5 10*3/uL (ref 0.0–0.5)
Eosinophils Relative: 10 %
HCT: 37.9 % (ref 36.0–46.0)
Hemoglobin: 12.9 g/dL (ref 12.0–15.0)
Immature Granulocytes: 0 %
Lymphocytes Relative: 40 %
Lymphs Abs: 2.1 10*3/uL (ref 0.7–4.0)
MCH: 31.6 pg (ref 26.0–34.0)
MCHC: 34 g/dL (ref 30.0–36.0)
MCV: 92.9 fL (ref 80.0–100.0)
Monocytes Absolute: 0.4 10*3/uL (ref 0.1–1.0)
Monocytes Relative: 7 %
Neutro Abs: 2.2 10*3/uL (ref 1.7–7.7)
Neutrophils Relative %: 42 %
Platelets: 290 10*3/uL (ref 150–400)
RBC: 4.08 MIL/uL (ref 3.87–5.11)
RDW: 12.6 % (ref 11.5–15.5)
WBC: 5.2 10*3/uL (ref 4.0–10.5)
nRBC: 0 % (ref 0.0–0.2)

## 2019-03-27 LAB — LIPASE, BLOOD: Lipase: 35 U/L (ref 11–51)

## 2019-03-27 LAB — POCT PREGNANCY, URINE: Preg Test, Ur: NEGATIVE

## 2019-03-27 MED ORDER — MINERAL OIL RE ENEM: 1.0000 | ENEMA | Freq: Once | RECTAL | Status: DC

## 2019-03-27 MED ORDER — IOHEXOL 9 MG/ML PO SOLN
500.0000 mL | ORAL | Status: AC | PRN
  Administered 2019-03-27 (×2): 500 mL via ORAL
  Filled 2019-03-27 (×2): qty 500

## 2019-03-27 MED ORDER — ONDANSETRON HCL 4 MG/2ML IJ SOLN
4.0000 mg | Freq: Once | INTRAMUSCULAR | Status: AC
  Administered 2019-03-27: 4 mg via INTRAVENOUS
  Filled 2019-03-27: qty 2

## 2019-03-27 MED ORDER — IOHEXOL 300 MG/ML  SOLN
100.0000 mL | Freq: Once | INTRAMUSCULAR | Status: AC | PRN
  Administered 2019-03-27: 100 mL via INTRAVENOUS

## 2019-03-27 MED ORDER — METOCLOPRAMIDE HCL 10 MG PO TABS: 10.0000 mg | ORAL_TABLET | Freq: Three times a day (TID) | ORAL | 1 refills | Status: DC | PRN

## 2019-03-27 MED ORDER — HYDROMORPHONE HCL 1 MG/ML IJ SOLN
1.0000 mg | Freq: Once | INTRAMUSCULAR | Status: AC
  Administered 2019-03-27: 1 mg via INTRAVENOUS
  Filled 2019-03-27: qty 1

## 2019-03-27 MED ORDER — HYDRALAZINE HCL 20 MG/ML IJ SOLN
20.0000 mg | Freq: Once | INTRAMUSCULAR | Status: AC
  Administered 2019-03-27: 20 mg via INTRAVENOUS
  Filled 2019-03-27: qty 1

## 2019-03-27 MED ORDER — HYDROMORPHONE HCL 1 MG/ML IJ SOLN
0.5000 mg | Freq: Once | INTRAMUSCULAR | Status: AC
  Administered 2019-03-27: 09:00:00 0.5 mg via INTRAVENOUS
  Filled 2019-03-27: qty 1

## 2019-03-27 NOTE — ED Notes (Signed)
Patient transported to CT 

## 2019-03-27 NOTE — ED Triage Notes (Addendum)
Patient ambulatory to triage with steady gait, without difficulty or distress noted, mask in place; pt reports she was here 2 days ago and dx with "blockage" but it was "early" and was d/c; c/o persistent abd pain with N/V; per chart notes pt was d/c with UTI and CT scan was negative

## 2019-03-27 NOTE — ED Notes (Signed)
Patient here for abdominal pain. Skin is warm, dry and intact. Skin color is appropriate for ethnicity. Respirations are even, symmetrical and non labored. Patient is tearful. Patient endorses nausea and vomiting. No emesis noted while patient has been here in ED. Patient was seen here a few days ago for same and dx with a UTI.

## 2019-03-27 NOTE — ED Provider Notes (Signed)
[facility] Emergency Department Provider Note       Time seen: ----------------------------------------- 7:17 AM on 03/27/2019 -----------------------------------------   I have reviewed the triage vital signs and the nursing notes.  HISTORY   Chief Complaint Abdominal Pain    HPI Sarah Sosa is a 38 y.o. female with a history of arthritis, asthma, chronic pain, GERD, headache, hypertension, PID who presents to the ED for abdominal pain.  Patient states she was here 2 days ago and diagnosed with a "blockage".  Patient states she was told it was early but was discharged.  She has had persistent abdominal pain as well as nausea vomiting.  CT scan was reportedly negative at that time.  Past Medical History:  Diagnosis Date  . Arthritis   . Asthma   . Chronic pain disorder 03/19/2018  . GERD (gastroesophageal reflux disease)   . Headache   . Heart murmur   . History of trichomoniasis 03/19/2018  . Hypertension   . PID (pelvic inflammatory disease) 02/14/2018  . Uterine leiomyoma 03/19/2018    Patient Active Problem List   Diagnosis Date Noted  . Ileus ([place]) 04/21/2018  . Postoperative ileus ([place]) 04/20/2018  . Postoperative state 04/15/2018  . Uterine leiomyoma 03/19/2018  . Menorrhagia with regular cycle 03/19/2018  . Chronic pain disorder 03/19/2018  . Pelvic pain 03/19/2018  . Essential hypertension 03/19/2018    Past Surgical History:  Procedure Laterality Date  . ABDOMINAL HYSTERECTOMY    . CHOLECYSTECTOMY    . DENTAL SURGERY    . HYSTERECTOMY ABDOMINAL WITH SALPINGECTOMY Bilateral 04/15/2018   Procedure: HYSTERECTOMY ABDOMINAL WITH BILATERAL SALPINGECTOMY;  Surgeon: Rubie Maid, MD;  Location: [facility] ORS;  Service: Gynecology;  Laterality: Bilateral;  . kenn surgery Bilateral   . KNEE ARTHROSCOPY Left 2012, 2013    Allergies Latex, Nsaids, Tomato, and Tramadol  Social History Social History   Tobacco Use  . Smoking status: Current  Some Day Smoker    Packs/day: 0.50    Years: 22.00    Pack years: 11.00    Types: Cigarettes  . Smokeless tobacco: Never Used  Substance Use Topics  . Alcohol use: Yes    Comment: occasional  . Drug use: Yes    Types: Marijuana   Review of Systems Constitutional: Negative for fever. Cardiovascular: Negative for chest pain. Respiratory: Negative for shortness of breath. Gastrointestinal: Positive for abdominal pain, vomiting Musculoskeletal: Negative for back pain. Skin: Negative for rash. Neurological: Negative for headaches, focal weakness or numbness.  All systems negative/normal/unremarkable except as stated in the HPI  ____________________________________________   PHYSICAL EXAM:  VITAL SIGNS: ED Triage Vitals  Enc Vitals Group     BP 03/27/19 0532 (!) 227/115     Pulse Rate 03/27/19 0528 80     Resp 03/27/19 0528 20     Temp 03/27/19 0528 98.2 F (36.8 C)     Temp Source 03/27/19 0528 Oral     SpO2 03/27/19 0528 97 %     Weight --      Height --      Head Circumference --      Peak Flow --      Pain Score 03/27/19 0528 10     Pain Loc --      Pain Edu? --      Excl. in [place]? --    Constitutional: Alert and oriented.  Moderate distress from pain Eyes: Conjunctivae are normal. Normal extraocular movements. ENT      Head: Normocephalic and  atraumatic.      Nose: No congestion/rhinnorhea.      Mouth/Throat: Mucous membranes are moist.      Neck: No stridor. Cardiovascular: Normal rate, regular rhythm. No murmurs, rubs, or gallops. Respiratory: Normal respiratory effort without tachypnea nor retractions. Breath sounds are clear and equal bilaterally. No wheezes/rales/rhonchi. Gastrointestinal: Distended, high-pitched bowel sounds Musculoskeletal: Nontender with normal range of motion in extremities. No lower extremity tenderness nor edema. Neurologic:  Normal speech and language. No gross focal neurologic deficits are appreciated.  Skin:  Skin is warm, dry and  intact. No rash noted. Psychiatric: Mood and affect are normal. Speech and behavior are normal.  ____________________________________________  ED COURSE:  As part of my medical decision making, I reviewed the following data within the [facility] History obtained from family if available, nursing notes, old chart and ekg, as well as notes from prior ED visits. Patient presented for abdominal pain, we will assess with labs and imaging as indicated at this time.   Procedures  Sarah Sosa was evaluated in Emergency Department on 03/27/2019 for the symptoms described in the history of present illness. She was evaluated in the context of the global COVID-19 pandemic, which necessitated consideration that the patient might be at risk for infection with the SARS-CoV-2 virus that causes COVID-19. Institutional protocols and algorithms that pertain to the evaluation of patients at risk for COVID-19 are in a state of rapid change based on information released by regulatory bodies including the CDC and federal and state organizations. These policies and algorithms were followed during the patient's care in the ED.  ____________________________________________   LABS (pertinent positives/negatives)  Labs Reviewed  COMPREHENSIVE METABOLIC PANEL - Abnormal; Notable for the following components:      Result Value   Potassium 3.4 (*)    Glucose, Bld 112 (*)    Calcium 8.5 (*)    All other components within normal limits  URINALYSIS, COMPLETE (UACMP) WITH MICROSCOPIC - Abnormal; Notable for the following components:   Color, Urine YELLOW (*)    APPearance HAZY (*)    Bacteria, UA RARE (*)    All other components within normal limits  CBC WITH DIFFERENTIAL/PLATELET  LIPASE, BLOOD  POCT PREGNANCY, URINE    RADIOLOGY Images were viewed by me  Abdomen 2 view/CT IMPRESSION: 1. Somewhat swirled appearance of vessels in the mid to lower abdomen midline, a likely focus of internal  hernia. No bowel extends into this area on current examination.  2. No evident bowel obstruction. Moderate stool noted in colon. No abscess in the abdomen or pelvis. Appendix appears normal.  3. No evident renal or ureteral calculus. No hydronephrosis. Urinary bladder wall thickness normal.  4.  Gallbladder and uterus absent.  5.  Aortic Atherosclerosis (ICD10-I70.0). ____________________________________________   DIFFERENTIAL DIAGNOSIS   Ileus, small bowel obstruction, chronic pain, dehydration, electrolyte abnormality  FINAL ASSESSMENT AND PLAN  Abdominal pain, vomiting   Plan: The patient had presented for abdominal pain and vomiting with recent visit for same. Patient's labs initially look normal. Patient's imaging did not reveal a bowel obstruction, she does appear constipated.  I offered an enema which she has declined.  I do not feel comfortable prescribing narcotic pain medicine for her.  She will be referred to general surgery for close outpatient follow-up.   Laurence Aly, MD    Note: This note was generated in part or whole with voice recognition software. Voice recognition is usually quite accurate but there are transcription errors that  can and very often do occur. I apologize for any typographical errors that were not detected and corrected.     Earleen Newport, MD 03/27/19 1024

## 2019-03-27 NOTE — ED Notes (Signed)
Pt taken to xray via stretcher  

## 2019-03-27 NOTE — ED Notes (Signed)
EDP at bedside  

## 2019-03-27 NOTE — ED Notes (Signed)
Patient hysterical in room. Cry uncontrollably.  Patient stating "I cannot go home. If I go home I will come right back". Provider notified.

## 2019-03-27 NOTE — ED Notes (Signed)
Patient notified of need of urine sample.

## 2019-03-28 LAB — URINE CULTURE
Culture: >=100000 — AB
Special Requests: NORMAL

## 2019-05-11 ENCOUNTER — Emergency Department
Admission: EM | Admit: 2019-05-11 | Discharge: 2019-05-11 | Disposition: A | Discharge status: Home / Self Care | Payer: Medicaid Other | Attending: Emergency Medicine | Admitting: Emergency Medicine

## 2019-05-11 ENCOUNTER — Emergency Department: Payer: Medicaid Other

## 2019-05-11 ENCOUNTER — Encounter: Payer: Self-pay | Admitting: Emergency Medicine

## 2019-05-11 ENCOUNTER — Other Ambulatory Visit: Payer: Self-pay

## 2019-05-11 DIAGNOSIS — M778 Other enthesopathies, not elsewhere classified: Secondary | ICD-10-CM | POA: Insufficient documentation

## 2019-05-11 DIAGNOSIS — F17210 Nicotine dependence, cigarettes, uncomplicated: Secondary | ICD-10-CM | POA: Diagnosis not present

## 2019-05-11 DIAGNOSIS — Z79899 Other long term (current) drug therapy: Secondary | ICD-10-CM | POA: Diagnosis not present

## 2019-05-11 DIAGNOSIS — M25531 Pain in right wrist: Secondary | ICD-10-CM | POA: Diagnosis not present

## 2019-05-11 DIAGNOSIS — I10 Essential (primary) hypertension: Secondary | ICD-10-CM | POA: Diagnosis not present

## 2019-05-11 DIAGNOSIS — J45909 Unspecified asthma, uncomplicated: Secondary | ICD-10-CM | POA: Insufficient documentation

## 2019-05-11 DIAGNOSIS — Z91040 Latex allergy status: Secondary | ICD-10-CM | POA: Insufficient documentation

## 2019-05-11 DIAGNOSIS — M79641 Pain in right hand: Secondary | ICD-10-CM | POA: Diagnosis present

## 2019-05-11 MED ORDER — PREDNISONE 10 MG PO TABS: ORAL_TABLET | ORAL | 0 refills | Status: DC

## 2019-05-11 MED ORDER — DEXAMETHASONE SODIUM PHOSPHATE 10 MG/ML IJ SOLN
10.0000 mg | Freq: Once | INTRAMUSCULAR | Status: AC
  Administered 2019-05-11: 10:00:00 10 mg via INTRAMUSCULAR
  Filled 2019-05-11: qty 1

## 2019-05-11 NOTE — ED Provider Notes (Signed)
[facility] Emergency Department Provider Note  ____________________________________________   First MD Initiated Contact with Patient 05/11/19 [phone]     (approximate)  I have reviewed the triage vital signs and the nursing notes.   HISTORY  Chief Complaint Hand Pain   HPI Sarah Sosa is a 38 y.o. female presents to the ED with complaint of right wrist and hand pain since yesterday.  Patient denies any injury and states that she woke up with pain.  She is right-hand dominant but denies any repetitive work.  She has taken some over-the-counter medication without relief.  She rates her pain as a 9/10.     Past Medical History:  Diagnosis Date  . Arthritis   . Asthma   . Chronic pain disorder 03/19/2018  . GERD (gastroesophageal reflux disease)   . Headache   . Heart murmur   . History of trichomoniasis 03/19/2018  . Hypertension   . PID (pelvic inflammatory disease) 02/14/2018  . Uterine leiomyoma 03/19/2018    Patient Active Problem List   Diagnosis Date Noted  . Ileus ([place]) 04/21/2018  . Postoperative ileus ([place]) 04/20/2018  . Postoperative state 04/15/2018  . Uterine leiomyoma 03/19/2018  . Menorrhagia with regular cycle 03/19/2018  . Chronic pain disorder 03/19/2018  . Pelvic pain 03/19/2018  . Essential hypertension 03/19/2018    Past Surgical History:  Procedure Laterality Date  . ABDOMINAL HYSTERECTOMY    . CHOLECYSTECTOMY    . DENTAL SURGERY    . HYSTERECTOMY ABDOMINAL WITH SALPINGECTOMY Bilateral 04/15/2018   Procedure: HYSTERECTOMY ABDOMINAL WITH BILATERAL SALPINGECTOMY;  Surgeon: Rubie Maid, MD;  Location: [facility] ORS;  Service: Gynecology;  Laterality: Bilateral;  . kenn surgery Bilateral   . KNEE ARTHROSCOPY Left 2012, 2013    Prior to Admission medications   Medication Sig Start Date End Date Taking? Authorizing Provider  ALPRAZolam (XANAX) 0.25 MG tablet Take 0.25 mg by mouth 2 (two) times daily.    [provider]   dicyclomine (BENTYL) 20 MG tablet Take 1 tablet (20 mg total) by mouth 3 (three) times daily as needed for spasms. 03/25/19 03/24/20  Duffy Bruce, MD  diphenhydramine-acetaminophen (TYLENOL PM) 25-500 MG TABS tablet Take 2 tablets by mouth at bedtime as needed (sleep).    [provider]  esomeprazole (NEXIUM) 40 MG capsule Take 1 capsule (40 mg total) by mouth daily. 12/10/18 12/10/19  Nena Polio, MD  lisinopril-hydrochlorothiazide (ZESTORETIC) 10-12.5 MG tablet Take 1 tablet by mouth daily. 04/23/18   Rubie Maid, MD  metoCLOPramide (REGLAN) 10 MG tablet Take 1 tablet (10 mg total) by mouth every 8 (eight) hours as needed for nausea or vomiting (can also be taken for headache). 03/27/19   Earleen Newport, MD  ondansetron (ZOFRAN ODT) 4 MG disintegrating tablet Take 1 tablet (4 mg total) by mouth every 8 (eight) hours as needed for nausea or vomiting. 03/25/19   Duffy Bruce, MD  oxyCODONE (ROXICODONE) 5 MG immediate release tablet Take 1 tablet (5 mg total) by mouth every 8 (eight) hours as needed for breakthrough pain. 03/25/19 03/24/20  Duffy Bruce, MD  oxyCODONE-acetaminophen (PERCOCET) 5-325 MG tablet Take 1 tablet by mouth every 4 (four) hours as needed for severe pain. 12/10/18 12/10/19  Nena Polio, MD  oxyCODONE-acetaminophen (PERCOCET) 7.5-325 MG tablet Take 1 tablet by mouth 4 (four) times daily.     [provider]  polyethylene glycol (MIRALAX) packet Take 17 g by mouth daily. Take daily for 2 weeks, then daily prn. 04/23/18  Rubie Maid, MD  potassium chloride (K-DUR) 10 MEQ tablet Take 2 tablets (20 mEq total) by mouth 2 (two) times daily for 3 days. 03/25/19 03/28/19  Duffy Bruce, MD  predniSONE (DELTASONE) 10 MG tablet Take 6 tablets  today, on day 2 take 5 tablets, day 3 take 4 tablets, day 4 take 3 tablets, day 5 take  2 tablets and 1 tablet the last day 05/11/19   Johnn Hai, PA-C    Allergies Latex, Nsaids, Tomato, and Tramadol  Family  History  Problem Relation Age of Onset  . Fibroids Mother   . Hypertension Mother   . Arthritis Mother     Social History Social History   Tobacco Use  . Smoking status: Current Some Day Smoker    Packs/day: 0.50    Years: 22.00    Pack years: 11.00    Types: Cigarettes  . Smokeless tobacco: Never Used  Substance Use Topics  . Alcohol use: Yes    Comment: occasional  . Drug use: Yes    Types: Marijuana    Review of Systems Constitutional: No fever/chills Cardiovascular: Denies chest pain. Respiratory: Denies shortness of breath. Gastrointestinal: No abdominal pain.  No nausea, no vomiting. Musculoskeletal: Positive right hand pain with increased pain right thumb. Skin: Negative for rash. Neurological: Negative for headaches, focal weakness or numbness. ___________________________________________   PHYSICAL EXAM:  VITAL SIGNS: ED Triage Vitals  Enc Vitals Group     BP 05/11/19 0829 (!) 199/95     Pulse Rate 05/11/19 0827 74     Resp 05/11/19 0827 16     Temp 05/11/19 0827 97.8 F (36.6 C)     Temp Source 05/11/19 0827 Oral     SpO2 05/11/19 0827 98 %     Weight 05/11/19 0828 200 lb (90.7 kg)     Height 05/11/19 0828 5\' 6"  (1.676 m)     Head Circumference --      Peak Flow --      Pain Score 05/11/19 0828 9     Pain Loc --      Pain Edu? --      Excl. in [place]? --    Constitutional: Alert and oriented. Well appearing and in no acute distress. Eyes: Conjunctivae are normal.  Head: Atraumatic. Neck: No stridor.   Cardiovascular: Normal rate, regular rhythm. Grossly normal heart sounds.  Good peripheral circulation. Respiratory: Normal respiratory effort.  No retractions. Lungs CTAB. Musculoskeletal: Examination of the right hand there is no gross deformity and no soft tissue injury appreciated.  There is marked tenderness and increased pain with range of motion both in flexion and extension of her right thumb.  Skin is intact.  Motor sensory function intact.   Pulses present.  No deformity is noted. Neurologic:  Normal speech and language. No gross focal neurologic deficits are appreciated. No gait instability. Skin:  Skin is warm, dry and intact.  No erythema, ecchymosis or abrasions were noted to the right hand. Psychiatric: Mood and affect are normal. Speech and behavior are normal.  ____________________________________________   LABS (all labs ordered are listed, but only abnormal results are displayed)  Labs Reviewed - No data to display  RADIOLOGY  ED MD interpretation:   Right hand x-ray is negative for acute bony injury.  Official radiology report(s): Dg Hand Complete Right  Result Date: 05/11/2019 CLINICAL DATA:  Pain laterally EXAM: RIGHT HAND - COMPLETE 3+ VIEW COMPARISON:  None. FINDINGS: Frontal, oblique, and lateral views were obtained. No fracture  or dislocation. Joint spaces appear normal. No erosive change. IMPRESSION: No fracture or dislocation.  No evident arthropathy. Electronically Signed   By: Lowella Grip III M.D.   On: 05/11/2019 09:18    ____________________________________________   PROCEDURES  Procedure(s) performed (including Critical Care):  Procedures Thumb spica splint was applied to the right hand.  ____________________________________________   INITIAL IMPRESSION / ASSESSMENT AND PLAN / ED COURSE  As part of my medical decision making, I reviewed the following data within the electronic MEDICAL RECORD NUMBER Notes from prior ED visits and [place] Controlled Substance Database  38 year old female presents to the ED with complaint of right hand pain that was noticed yesterday when she woke up.  She denied any history of injury.  Pain is increased with range of motion flexion and extension of her right thumb.  Patient is right-hand dominant and denies any repetitive movement.  No evidence of injury is noted on exam.  X-rays were negative for acute bony injury.  Patient most likely has a tendinitis of her  right thumb and was placed in a thumb spica splint.  She was given a prescription for prednisone to begin taking.  It was also noted in the [facility] website that patient filled a prescription on 04/30/2019 for Percocet 7.5 mg #124 from a doctor in [place].  Patient states that she will continue taking the medication that she has as directed by her doctor.  She will follow-up with her PCP in [place] if any continued problems.  ____________________________________________   FINAL CLINICAL IMPRESSION(S) / ED DIAGNOSES  Final diagnoses:  Tendinitis of thumb     ED Discharge Orders         Ordered    predniSONE (DELTASONE) 10 MG tablet     05/11/19 [id number]           Note:  This document was prepared using Dragon voice recognition software and may include unintentional dictation errors.    Johnn Hai, PA-C 05/11/19 1420    Vanessa Osceola, MD 05/12/19 1332

## 2019-05-11 NOTE — ED Triage Notes (Signed)
Pt to ED c/o right wrist and hand pain. Pt states that she woke up with the pain yesterday. Pt denies any known injury. Pt has not fallen. Swelling noted to the area. Pt unable to wrist and hand due to pain.

## 2019-05-11 NOTE — ED Notes (Signed)
Pt verbalized understanding of discharge instructions. NAD at this time. 

## 2019-05-11 NOTE — Discharge Instructions (Signed)
Follow-up with your primary care provider if any continued problems.  You may ice and elevate your hand to help reduce swelling and help with pain.  Continue your regular medications.  Also you were given a prescription for prednisone starting with 6 tablets and tapering down.  Wear the thumb spica splint for added support of your thumb but no more than 4 to 5 days.  You do not have to wear this while you are sleeping if you are able.  This can also be removed to take showers.

## 2019-05-13 ENCOUNTER — Encounter: Payer: Self-pay | Admitting: Emergency Medicine

## 2019-05-13 ENCOUNTER — Emergency Department
Admission: EM | Admit: 2019-05-13 | Discharge: 2019-05-13 | Disposition: A | Discharge status: Home / Self Care | Payer: Medicaid Other | Attending: Emergency Medicine | Admitting: Emergency Medicine

## 2019-05-13 ENCOUNTER — Emergency Department: Payer: Medicaid Other

## 2019-05-13 ENCOUNTER — Other Ambulatory Visit: Payer: Self-pay

## 2019-05-13 DIAGNOSIS — I10 Essential (primary) hypertension: Secondary | ICD-10-CM | POA: Insufficient documentation

## 2019-05-13 DIAGNOSIS — R112 Nausea with vomiting, unspecified: Secondary | ICD-10-CM | POA: Insufficient documentation

## 2019-05-13 DIAGNOSIS — Z79899 Other long term (current) drug therapy: Secondary | ICD-10-CM | POA: Diagnosis not present

## 2019-05-13 DIAGNOSIS — R011 Cardiac murmur, unspecified: Secondary | ICD-10-CM | POA: Insufficient documentation

## 2019-05-13 DIAGNOSIS — Z91018 Allergy to other foods: Secondary | ICD-10-CM | POA: Insufficient documentation

## 2019-05-13 DIAGNOSIS — Z886 Allergy status to analgesic agent status: Secondary | ICD-10-CM | POA: Diagnosis not present

## 2019-05-13 DIAGNOSIS — R1010 Upper abdominal pain, unspecified: Secondary | ICD-10-CM | POA: Diagnosis present

## 2019-05-13 DIAGNOSIS — J45909 Unspecified asthma, uncomplicated: Secondary | ICD-10-CM | POA: Insufficient documentation

## 2019-05-13 DIAGNOSIS — Z91040 Latex allergy status: Secondary | ICD-10-CM | POA: Diagnosis not present

## 2019-05-13 DIAGNOSIS — F17210 Nicotine dependence, cigarettes, uncomplicated: Secondary | ICD-10-CM | POA: Insufficient documentation

## 2019-05-13 LAB — CBC
HCT: 43.2 % (ref 36.0–46.0)
Hemoglobin: 14.4 g/dL (ref 12.0–15.0)
MCH: 31 pg (ref 26.0–34.0)
MCHC: 33.3 g/dL (ref 30.0–36.0)
MCV: 93.1 fL (ref 80.0–100.0)
Platelets: 329 10*3/uL (ref 150–400)
RBC: 4.64 MIL/uL (ref 3.87–5.11)
RDW: 12.2 % (ref 11.5–15.5)
WBC: 6.8 10*3/uL (ref 4.0–10.5)
nRBC: 0 % (ref 0.0–0.2)

## 2019-05-13 LAB — URINALYSIS, COMPLETE (UACMP) WITH MICROSCOPIC
Bilirubin Urine: NEGATIVE
Glucose, UA: NEGATIVE mg/dL
Hgb urine dipstick: NEGATIVE
Ketones, ur: NEGATIVE mg/dL
Nitrite: NEGATIVE
Protein, ur: NEGATIVE mg/dL
Specific Gravity, Urine: 1.012 (ref 1.005–1.030)
pH: 6 (ref 5.0–8.0)

## 2019-05-13 LAB — COMPREHENSIVE METABOLIC PANEL
ALT: 8 U/L (ref 0–44)
AST: 13 U/L — ABNORMAL LOW (ref 15–41)
Albumin: 3.7 g/dL (ref 3.5–5.0)
Alkaline Phosphatase: 54 U/L (ref 38–126)
Anion gap: 11 (ref 5–15)
BUN: 9 mg/dL (ref 6–20)
CO2: 24 mmol/L (ref 22–32)
Calcium: 9.2 mg/dL (ref 8.9–10.3)
Chloride: 110 mmol/L (ref 98–111)
Creatinine, Ser: 0.8 mg/dL (ref 0.44–1.00)
GFR calc Af Amer: >60 mL/min (ref >60)
GFR calc non Af Amer: >60 mL/min (ref >60)
Glucose, Bld: 95 mg/dL (ref 70–99)
Potassium: 3.2 mmol/L — ABNORMAL LOW (ref 3.5–5.1)
Sodium: 145 mmol/L (ref 135–145)
Total Bilirubin: 0.6 mg/dL (ref 0.3–1.2)
Total Protein: 7.7 g/dL (ref 6.5–8.1)

## 2019-05-13 LAB — POCT PREGNANCY, URINE: Preg Test, Ur: NEGATIVE

## 2019-05-13 LAB — LIPASE, BLOOD: Lipase: 35 U/L (ref 11–51)

## 2019-05-13 MED ORDER — IOHEXOL 9 MG/ML PO SOLN
500.0000 mL | Freq: Once | ORAL | Status: AC
  Administered 2019-05-13: 1000 mL via ORAL

## 2019-05-13 MED ORDER — IOHEXOL 300 MG/ML  SOLN
100.0000 mL | Freq: Once | INTRAMUSCULAR | Status: AC | PRN
  Administered 2019-05-13: 100 mL via INTRAVENOUS

## 2019-05-13 MED ORDER — MAGNESIUM CITRATE PO SOLN: 1.0000 | Freq: Once | ORAL | 0 refills | Status: AC

## 2019-05-13 MED ORDER — MORPHINE SULFATE (PF) 4 MG/ML IV SOLN
4.0000 mg | Freq: Once | INTRAVENOUS | Status: AC
  Administered 2019-05-13: 4 mg via INTRAVENOUS
  Filled 2019-05-13: qty 1

## 2019-05-13 MED ORDER — ONDANSETRON HCL 4 MG/2ML IJ SOLN
4.0000 mg | Freq: Once | INTRAMUSCULAR | Status: AC
  Administered 2019-05-13: 4 mg via INTRAVENOUS
  Filled 2019-05-13: qty 2

## 2019-05-13 NOTE — Discharge Instructions (Signed)
In addition to the MiraLAX that you are already on, you can take magnesium citrate in order to help move your bowels, which may relieve your pain.  You can continue to take the pain medication that you are already on.  Return to the ER immediately for new, worsening, or persistent severe abdominal pain, vomiting, fever, weakness, or any other new or worsening symptoms that concern you.

## 2019-05-13 NOTE — ED Notes (Signed)
CT notified that pt finished with contrast. 

## 2019-05-13 NOTE — ED Triage Notes (Signed)
Pt c/o constipation x3 days with no relief from Murelax. Pt c/o lower abdominal pain.

## 2019-05-13 NOTE — ED Provider Notes (Signed)
[facility] Emergency Department Provider Note ____________________________________________   First MD Initiated Contact with Patient 05/13/19 0820     (approximate)  I have reviewed the triage vital signs and the nursing notes.   HISTORY  Chief Complaint No chief complaint on file.    HPI Sarah Sosa is a 38 y.o. female with PMH as noted below including prior history of cholecystectomy and hysterectomy as well as a prior history of SBO who presents with bilateral upper abdominal pain over the last 3 to 4 days, gradual onset, and constant but coming in more severe waves.  She reports associated nausea but no vomiting.  She states that she has not had a bowel movement in the last several days despite taking MiraLAX chronically.  She denies fever or chills or any urinary symptoms.  She states that the pain feels similar to when she had an SBO previously.  Past Medical History:  Diagnosis Date   Arthritis    Asthma    Chronic pain disorder 03/19/2018   GERD (gastroesophageal reflux disease)    Headache    Heart murmur    History of trichomoniasis 03/19/2018   Hypertension    PID (pelvic inflammatory disease) 02/14/2018   Uterine leiomyoma 03/19/2018    Patient Active Problem List   Diagnosis Date Noted   Ileus ([place]) 04/21/2018   Postoperative ileus ([place]) 04/20/2018   Postoperative state 04/15/2018   Uterine leiomyoma 03/19/2018   Menorrhagia with regular cycle 03/19/2018   Chronic pain disorder 03/19/2018   Pelvic pain 03/19/2018   Essential hypertension 03/19/2018    Past Surgical History:  Procedure Laterality Date   ABDOMINAL HYSTERECTOMY     CHOLECYSTECTOMY     DENTAL SURGERY     HYSTERECTOMY ABDOMINAL WITH SALPINGECTOMY Bilateral 04/15/2018   Procedure: HYSTERECTOMY ABDOMINAL WITH BILATERAL SALPINGECTOMY;  Surgeon: Rubie Maid, MD;  Location: [facility] ORS;  Service: Gynecology;  Laterality: Bilateral;   kenn surgery  Bilateral    KNEE ARTHROSCOPY Left 2012, 2013    Prior to Admission medications   Medication Sig Start Date End Date Taking? Authorizing Provider  ALPRAZolam (XANAX) 0.25 MG tablet Take 0.25 mg by mouth 2 (two) times daily as needed.    Yes [provider]  dicyclomine (BENTYL) 20 MG tablet Take 1 tablet (20 mg total) by mouth 3 (three) times daily as needed for spasms. 03/25/19 03/24/20 Yes Duffy Bruce, MD  diphenhydramine-acetaminophen (TYLENOL PM) 25-500 MG TABS tablet Take 2 tablets by mouth at bedtime as needed (sleep).   Yes [provider]  lisinopril-hydrochlorothiazide (ZESTORETIC) 10-12.5 MG tablet Take 1 tablet by mouth daily. Patient taking differently: Take 2 tablets by mouth at bedtime.  04/23/18  Yes Rubie Maid, MD  metoCLOPramide (REGLAN) 10 MG tablet Take 1 tablet (10 mg total) by mouth every 8 (eight) hours as needed for nausea or vomiting (can also be taken for headache). 03/27/19  Yes Earleen Newport, MD  oxyCODONE-acetaminophen (PERCOCET) 7.5-325 MG tablet Take 1 tablet by mouth 4 (four) times daily.    Yes [provider]  polyethylene glycol (MIRALAX) packet Take 17 g by mouth daily. Take daily for 2 weeks, then daily prn. Patient taking differently: Take 17 g by mouth daily as needed. Take daily for 2 weeks, then daily prn 04/23/18  Yes Rubie Maid, MD  predniSONE (DELTASONE) 10 MG tablet Take 6 tablets  today, on day 2 take 5 tablets, day 3 take 4 tablets, day 4 take 3 tablets, day 5 take  2 tablets and 1 tablet the last day 05/11/19  Yes Letitia Neri L, PA-C  magnesium citrate SOLN Take 296 mLs (1 Bottle total) by mouth once for 1 dose. 05/13/19 05/13/19  Arta Silence, MD    Allergies Latex, Nsaids, Tomato, and Tramadol  Family History  Problem Relation Age of Onset   Fibroids Mother    Hypertension Mother    Arthritis Mother     Social History Social History   Tobacco Use   Smoking status: Current Some Day  Smoker    Packs/day: 0.50    Years: 22.00    Pack years: 11.00    Types: Cigarettes   Smokeless tobacco: Never Used  Substance Use Topics   Alcohol use: Yes    Comment: occasional   Drug use: Yes    Types: Marijuana    Review of Systems  Constitutional: No fever. Eyes: No redness. ENT: No sore throat. Cardiovascular: Denies chest pain. Respiratory: Denies shortness of breath. Gastrointestinal: Positive for nausea. Genitourinary: Negative for dysuria.  Musculoskeletal: Negative for back pain. Skin: Negative for rash. Neurological: Negative for headache.   ____________________________________________   PHYSICAL EXAM:  VITAL SIGNS: ED Triage Vitals  Enc Vitals Group     BP 05/13/19 0556 (!) 189/115     Pulse Rate 05/13/19 0556 90     Resp --      Temp 05/13/19 0556 97.6 F (36.4 C)     Temp Source 05/13/19 0556 Oral     SpO2 05/13/19 0556 99 %     Weight --      Height --      Head Circumference --      Peak Flow --      Pain Score 05/13/19 0739 8     Pain Loc --      Pain Edu? --      Excl. in [place]? --     Constitutional: Alert and oriented.  Relatively well appearing and in no acute distress. Eyes: Conjunctivae are normal.  No scleral icterus. Head: Atraumatic. Nose: No congestion/rhinnorhea. Mouth/Throat: Mucous membranes are moist.   Neck: Normal range of motion.  Cardiovascular: Normal rate, regular rhythm. Good peripheral circulation. Respiratory: Normal respiratory effort.  No retractions. Gastrointestinal: Soft with moderate bilateral upper quadrant tenderness.  No distention.  Genitourinary: No flank tenderness. Musculoskeletal: Extremities warm and well perfused.  Neurologic:  Normal speech and language. No gross focal neurologic deficits are appreciated.  Skin:  Skin is warm and dry. No rash noted. Psychiatric: Mood and affect are normal. Speech and behavior are normal.  ____________________________________________   LABS (all labs ordered  are listed, but only abnormal results are displayed)  Labs Reviewed  COMPREHENSIVE METABOLIC PANEL - Abnormal; Notable for the following components:      Result Value   Potassium 3.2 (*)    AST 13 (*)    All other components within normal limits  URINALYSIS, COMPLETE (UACMP) WITH MICROSCOPIC - Abnormal; Notable for the following components:   Color, Urine YELLOW (*)    APPearance CLEAR (*)    Leukocytes,Ua TRACE (*)    Bacteria, UA RARE (*)    All other components within normal limits  LIPASE, BLOOD  CBC  POC URINE PREG, ED  POCT PREGNANCY, URINE   ____________________________________________  EKG   ____________________________________________  RADIOLOGY  CT abdomen: No acute abnormality  ____________________________________________   PROCEDURES  Procedure(s) performed: No  Procedures  Critical Care performed: No ____________________________________________   INITIAL IMPRESSION / ASSESSMENT AND PLAN /  ED COURSE  Pertinent labs & imaging results that were available during my care of the patient were reviewed by me and considered in my medical decision making (see chart for details).  38 year old female with PMH as noted above presents with bilateral upper abdominal pain over the last 3 to 4 days associated with nausea but no vomiting.  She has not had a bowel movement in the last several days.  She reports that she is chronically on MiraLAX due to also chronically being on Percocet for knee pain.  I reviewed the past medical records in [place].  The patient has a history of a hysterectomy and cholecystectomy.  The patient was seen in the ED last month with similar abdominal pain and a negative work-up.  She was also seen in May with a similar presentation and negative work-up at that time as well.  She was last admitted approximately 1 year ago with a postoperative ileus after abdominal hysterectomy.  On exam the patient is overall well-appearing.  Her vital signs are  normal except for hypertension.  She has bilateral upper abdominal tenderness but no peritoneal signs.  The exam is otherwise unremarkable.  Given her prior history, differential includes ileus or SBO, as well as more benign etiology such as gastritis or and exacerbation of what appears to be chronic recurrent abdominal pain.  The initial lab work-up is unremarkable, so there is no evidence of pancreatitis or other hepatobiliary etiology.  Given the patient surgical history and elevated risk we will obtain a CT to rule out SBO.  ----------------------------------------- 11:49 AM on 05/13/2019 -----------------------------------------  CT abdomen shows no acute abnormality.  The patient appears comfortable.  She required a second dose of pain medication, but now states that she feels better.  She has been tolerating p.o. throughout this episode of pain, and was able to tolerate the contrast without difficulty.  Overall the presentation is consistent with exacerbation of chronic recurrent abdominal pain.  I recommended that the patient take magnesium citrate if needed for better constipation relief.  I gave her very thorough return precautions, and she expressed understanding.  She is stable for discharge at this time. ____________________________________________   FINAL CLINICAL IMPRESSION(S) / ED DIAGNOSES  Final diagnoses:  Pain of upper abdomen      NEW MEDICATIONS STARTED DURING THIS VISIT:  New Prescriptions   MAGNESIUM CITRATE SOLN    Take 296 mLs (1 Bottle total) by mouth once for 1 dose.     Note:  This document was prepared using Dragon voice recognition software and may include unintentional dictation errors.    Arta Silence, MD 05/13/19 1150

## 2019-06-14 ENCOUNTER — Emergency Department
Admission: EM | Admit: 2019-06-14 | Discharge: 2019-06-14 | Disposition: A | Discharge status: Home / Self Care | Payer: Medicaid Other | Attending: Emergency Medicine | Admitting: Emergency Medicine

## 2019-06-14 ENCOUNTER — Other Ambulatory Visit: Payer: Self-pay

## 2019-06-14 DIAGNOSIS — I10 Essential (primary) hypertension: Secondary | ICD-10-CM | POA: Insufficient documentation

## 2019-06-14 DIAGNOSIS — R1013 Epigastric pain: Secondary | ICD-10-CM | POA: Insufficient documentation

## 2019-06-14 DIAGNOSIS — Z91040 Latex allergy status: Secondary | ICD-10-CM | POA: Insufficient documentation

## 2019-06-14 DIAGNOSIS — J45909 Unspecified asthma, uncomplicated: Secondary | ICD-10-CM | POA: Insufficient documentation

## 2019-06-14 DIAGNOSIS — Z79899 Other long term (current) drug therapy: Secondary | ICD-10-CM | POA: Insufficient documentation

## 2019-06-14 DIAGNOSIS — F17210 Nicotine dependence, cigarettes, uncomplicated: Secondary | ICD-10-CM | POA: Insufficient documentation

## 2019-06-14 LAB — COMPREHENSIVE METABOLIC PANEL
ALT: 10 U/L (ref 0–44)
AST: 17 U/L (ref 15–41)
Albumin: 3.8 g/dL (ref 3.5–5.0)
Alkaline Phosphatase: 47 U/L (ref 38–126)
Anion gap: 11 (ref 5–15)
BUN: 9 mg/dL (ref 6–20)
CO2: 22 mmol/L (ref 22–32)
Calcium: 9.4 mg/dL (ref 8.9–10.3)
Chloride: 105 mmol/L (ref 98–111)
Creatinine, Ser: 0.79 mg/dL (ref 0.44–1.00)
GFR calc Af Amer: >60 mL/min (ref >60)
GFR calc non Af Amer: >60 mL/min (ref >60)
Glucose, Bld: 96 mg/dL (ref 70–99)
Potassium: 3.9 mmol/L (ref 3.5–5.1)
Sodium: 138 mmol/L (ref 135–145)
Total Bilirubin: 0.6 mg/dL (ref 0.3–1.2)
Total Protein: 7.5 g/dL (ref 6.5–8.1)

## 2019-06-14 LAB — HCG, QUANTITATIVE, PREGNANCY: hCG, Beta Chain, Quant, S: <1 m[IU]/mL (ref <5)

## 2019-06-14 LAB — CBC
HCT: 41.9 % (ref 36.0–46.0)
Hemoglobin: 13.9 g/dL (ref 12.0–15.0)
MCH: 31.2 pg (ref 26.0–34.0)
MCHC: 33.2 g/dL (ref 30.0–36.0)
MCV: 93.9 fL (ref 80.0–100.0)
Platelets: 318 10*3/uL (ref 150–400)
RBC: 4.46 MIL/uL (ref 3.87–5.11)
RDW: 12.2 % (ref 11.5–15.5)
WBC: 4.5 10*3/uL (ref 4.0–10.5)
nRBC: 0 % (ref 0.0–0.2)

## 2019-06-14 LAB — LIPASE, BLOOD: Lipase: 32 U/L (ref 11–51)

## 2019-06-14 MED ORDER — SODIUM CHLORIDE 0.9% FLUSH: 3.0000 mL | Freq: Once | INTRAVENOUS | Status: DC

## 2019-06-14 MED ORDER — ONDANSETRON 4 MG PO TBDP: 4.0000 mg | ORAL_TABLET | Freq: Three times a day (TID) | ORAL | 0 refills | Status: DC | PRN

## 2019-06-14 MED ORDER — DICYCLOMINE HCL 20 MG PO TABS: 20.0000 mg | ORAL_TABLET | Freq: Three times a day (TID) | ORAL | 0 refills | Status: DC | PRN

## 2019-06-14 MED ORDER — DROPERIDOL 2.5 MG/ML IJ SOLN
2.5000 mg | Freq: Once | INTRAMUSCULAR | Status: AC
  Administered 2019-06-14: 2.5 mg via INTRAVENOUS
  Filled 2019-06-14: qty 2

## 2019-06-14 NOTE — ED Provider Notes (Signed)
[facility] Emergency Department Provider Note    First MD Initiated Contact with Patient 06/14/19 0522     (approximate)  I have reviewed the triage vital signs and the nursing notes.   HISTORY  Chief Complaint Abdominal Pain and Flank Pain    HPI Sarah Sosa is a 38 y.o. female with below list of previous medical conditions including chronic pain presents to the emergency department secondary to upper abdominal discomfort which patient states is currently 10 out of 10.  Patient also admits to nausea and vomiting.  Patient states that symptoms have been occurring for the past 3 days.  Patient denies any fever.  Patient denies any known sick contact.  Patient denies any urinary symptoms.     Past Medical History:  Diagnosis Date  . Arthritis   . Asthma   . Chronic pain disorder 03/19/2018  . GERD (gastroesophageal reflux disease)   . Headache   . Heart murmur   . History of trichomoniasis 03/19/2018  . Hypertension   . PID (pelvic inflammatory disease) 02/14/2018  . Uterine leiomyoma 03/19/2018    Patient Active Problem List   Diagnosis Date Noted  . Ileus ([place]) 04/21/2018  . Postoperative ileus ([place]) 04/20/2018  . Postoperative state 04/15/2018  . Uterine leiomyoma 03/19/2018  . Menorrhagia with regular cycle 03/19/2018  . Chronic pain disorder 03/19/2018  . Pelvic pain 03/19/2018  . Essential hypertension 03/19/2018    Past Surgical History:  Procedure Laterality Date  . ABDOMINAL HYSTERECTOMY    . CHOLECYSTECTOMY    . DENTAL SURGERY    . HYSTERECTOMY ABDOMINAL WITH SALPINGECTOMY Bilateral 04/15/2018   Procedure: HYSTERECTOMY ABDOMINAL WITH BILATERAL SALPINGECTOMY;  Surgeon: Rubie Maid, MD;  Location: [facility] ORS;  Service: Gynecology;  Laterality: Bilateral;  . kenn surgery Bilateral   . KNEE ARTHROSCOPY Left 2012, 2013    Prior to Admission medications   Medication Sig Start Date End Date Taking? Authorizing Provider  ALPRAZolam  (XANAX) 0.25 MG tablet Take 0.25 mg by mouth 2 (two) times daily as needed.     [provider]  dicyclomine (BENTYL) 20 MG tablet Take 1 tablet (20 mg total) by mouth 3 (three) times daily as needed for spasms. 03/25/19 03/24/20  Duffy Bruce, MD  dicyclomine (BENTYL) 20 MG tablet Take 1 tablet (20 mg total) by mouth 3 (three) times daily as needed for spasms. 06/14/19 07/14/19  Gregor Hams, MD  diphenhydramine-acetaminophen (TYLENOL PM) 25-500 MG TABS tablet Take 2 tablets by mouth at bedtime as needed (sleep).    [provider]  lisinopril-hydrochlorothiazide (ZESTORETIC) 10-12.5 MG tablet Take 1 tablet by mouth daily. Patient taking differently: Take 2 tablets by mouth at bedtime.  04/23/18   Rubie Maid, MD  metoCLOPramide (REGLAN) 10 MG tablet Take 1 tablet (10 mg total) by mouth every 8 (eight) hours as needed for nausea or vomiting (can also be taken for headache). 03/27/19   Earleen Newport, MD  ondansetron (ZOFRAN ODT) 4 MG disintegrating tablet Take 1 tablet (4 mg total) by mouth every 8 (eight) hours as needed. 06/14/19   Gregor Hams, MD  oxyCODONE-acetaminophen (PERCOCET) 7.5-325 MG tablet Take 1 tablet by mouth 4 (four) times daily.     [provider]  polyethylene glycol (MIRALAX) packet Take 17 g by mouth daily. Take daily for 2 weeks, then daily prn. Patient taking differently: Take 17 g by mouth daily as needed. Take daily for 2 weeks, then daily prn 04/23/18   Rubie Maid,  MD  predniSONE (DELTASONE) 10 MG tablet Take 6 tablets  today, on day 2 take 5 tablets, day 3 take 4 tablets, day 4 take 3 tablets, day 5 take  2 tablets and 1 tablet the last day 05/11/19   Johnn Hai, PA-C    Allergies Latex, Nsaids, Tomato, and Tramadol  Family History  Problem Relation Age of Onset  . Fibroids Mother   . Hypertension Mother   . Arthritis Mother     Social History Social History   Tobacco Use  . Smoking status: Current Some Day  Smoker    Packs/day: 0.50    Years: 22.00    Pack years: 11.00    Types: Cigarettes  . Smokeless tobacco: Never Used  Substance Use Topics  . Alcohol use: Yes    Comment: occasional  . Drug use: Yes    Types: Marijuana    Review of Systems Constitutional: No fever/chills Eyes: No visual changes. ENT: No sore throat. Cardiovascular: Denies chest pain. Respiratory: Denies shortness of breath. Gastrointestinal: Positive for abdominal pain and nausea/vomiting   No diarrhea.  No constipation. Genitourinary: Negative for dysuria. Musculoskeletal: Negative for neck pain.  Negative for back pain. Integumentary: Negative for rash. Neurological: Negative for headaches, focal weakness or numbness.   ____________________________________________   PHYSICAL EXAM:  VITAL SIGNS: ED Triage Vitals  Enc Vitals Group     BP 06/14/19 0234 (!) 188/110     Pulse Rate 06/14/19 0234 87     Resp 06/14/19 0234 19     Temp 06/14/19 0234 98 F (36.7 C)     Temp src --      SpO2 06/14/19 0234 100 %     Weight 06/14/19 0232 93 kg (205 lb)     Height 06/14/19 0232 1.702 m (5\' 7" )     Head Circumference --      Peak Flow --      Pain Score 06/14/19 0232 10     Pain Loc --      Pain Edu? --      Excl. in [place]? --     Constitutional: Alert and oriented.  Eyes: Conjunctivae are normal.  Mouth/Throat: Patient is wearing a mask. Neck: No stridor.  No meningeal signs.   Cardiovascular: Normal rate, regular rhythm. Good peripheral circulation. Grossly normal heart sounds. Respiratory: Normal respiratory effort.  No retractions. Gastrointestinal: Epigastric tenderness to palpation.. No distention.  Musculoskeletal: No lower extremity tenderness nor edema. No gross deformities of extremities. Neurologic:  Normal speech and language. No gross focal neurologic deficits are appreciated.  Skin:  Skin is warm, dry and intact. Psychiatric: Mood and affect are normal. Speech and behavior are normal.   ____________________________________________   LABS (all labs ordered are listed, but only abnormal results are displayed)  Labs Reviewed  LIPASE, BLOOD  COMPREHENSIVE METABOLIC PANEL  CBC  HCG, QUANTITATIVE, PREGNANCY  URINALYSIS, COMPLETE (UACMP) WITH MICROSCOPIC   _______________________________   Procedures   ____________________________________________   INITIAL IMPRESSION / MDM / [place] / ED COURSE  As part of my medical decision making, I reviewed the following data within the electronic MEDICAL RECORD NUMBER  38 year old female presented with above-stated history and physical exam secondary to upper abdominal discomfort.  Patient's laboratory data normal.  Reviewed the patient's chart revealed multiple CT scan of the abdomen pelvis performed secondary to the samE most recently on October 27 and twice in September.  As such further imaging not performed today.  Patient given droperidol in  the emergency department with resolution of symptoms.  On reevaluation patient resting comfortably but easily arousable to verbal stimuli.  Patient is under the care of care management as such narcotic database review revealed recent prescription for Percocet 7.5 mg on April 28, 2019.  Patient will be referred to chronic pain physician for further outpatient evaluation.  ____________________________________________  FINAL CLINICAL IMPRESSION(S) / ED DIAGNOSES  Final diagnoses:  Epigastric pain     MEDICATIONS GIVEN DURING THIS VISIT:  Medications  sodium chloride flush (NS) 0.9 % injection 3 mL (3 mLs Intravenous Not Given 06/14/19 0551)  droperidol (INAPSINE) 2.5 MG/ML injection 2.5 mg (2.5 mg Intravenous Given 06/14/19 0549)     ED Discharge Orders         Ordered    ondansetron (ZOFRAN ODT) 4 MG disintegrating tablet  Every 8 hours PRN     06/14/19 0620    dicyclomine (BENTYL) 20 MG tablet  3 times daily PRN     06/14/19 [id number]          *Please note:  Sarah  Sosa was evaluated in Emergency Department on 06/14/2019 for the symptoms described in the history of present illness. She was evaluated in the context of the global COVID-19 pandemic, which necessitated consideration that the patient might be at risk for infection with the SARS-CoV-2 virus that causes COVID-19. Institutional protocols and algorithms that pertain to the evaluation of patients at risk for COVID-19 are in a state of rapid change based on information released by regulatory bodies including the CDC and federal and state organizations. These policies and algorithms were followed during the patient's care in the ED.  Some ED evaluations and interventions may be delayed as a result of limited staffing during the pandemic.*  Note:  This document was prepared using Dragon voice recognition software and may include unintentional dictation errors.   Gregor Hams, MD 06/14/19 [phone]

## 2019-06-14 NOTE — ED Triage Notes (Signed)
Patient c/o epigastric pain, right flank pain, and N/V.

## 2019-06-14 NOTE — ED Notes (Signed)
Pt verbalized understanding of discharge instructions. NAD at this time. 

## 2019-07-22 ENCOUNTER — Other Ambulatory Visit: Payer: Self-pay

## 2019-07-22 DIAGNOSIS — R1031 Right lower quadrant pain: Secondary | ICD-10-CM | POA: Insufficient documentation

## 2019-07-22 DIAGNOSIS — Z5321 Procedure and treatment not carried out due to patient leaving prior to being seen by health care provider: Secondary | ICD-10-CM | POA: Diagnosis not present

## 2019-07-22 LAB — COMPREHENSIVE METABOLIC PANEL
ALT: 9 U/L (ref 0–44)
AST: 15 U/L (ref 15–41)
Albumin: 3.6 g/dL (ref 3.5–5.0)
Alkaline Phosphatase: 46 U/L (ref 38–126)
Anion gap: 9 (ref 5–15)
BUN: 14 mg/dL (ref 6–20)
CO2: 20 mmol/L — ABNORMAL LOW (ref 22–32)
Calcium: 9.2 mg/dL (ref 8.9–10.3)
Chloride: 110 mmol/L (ref 98–111)
Creatinine, Ser: 0.76 mg/dL (ref 0.44–1.00)
GFR calc Af Amer: >60 mL/min (ref >60)
GFR calc non Af Amer: >60 mL/min (ref >60)
Glucose, Bld: 128 mg/dL — ABNORMAL HIGH (ref 70–99)
Potassium: 3.4 mmol/L — ABNORMAL LOW (ref 3.5–5.1)
Sodium: 139 mmol/L (ref 135–145)
Total Bilirubin: 0.6 mg/dL (ref 0.3–1.2)
Total Protein: 7.3 g/dL (ref 6.5–8.1)

## 2019-07-22 LAB — LIPASE, BLOOD: Lipase: 34 U/L (ref 11–51)

## 2019-07-22 LAB — CBC
HCT: 40.5 % (ref 36.0–46.0)
Hemoglobin: 13.5 g/dL (ref 12.0–15.0)
MCH: 31.3 pg (ref 26.0–34.0)
MCHC: 33.3 g/dL (ref 30.0–36.0)
MCV: 94 fL (ref 80.0–100.0)
Platelets: 289 10*3/uL (ref 150–400)
RBC: 4.31 MIL/uL (ref 3.87–5.11)
RDW: 12.7 % (ref 11.5–15.5)
WBC: 5.9 10*3/uL (ref 4.0–10.5)
nRBC: 0 % (ref 0.0–0.2)

## 2019-07-22 NOTE — ED Triage Notes (Signed)
Pt in with co right sided abd pain that radiates to lower back for 2 days. States has had urinary urgency, and vomiting for 2 days. Denies any diarrhea or fever.

## 2019-07-22 NOTE — ED Notes (Signed)
Pt unable to give urine sample at this time; pt has specimen cup. 

## 2019-07-23 ENCOUNTER — Telehealth: Payer: Self-pay | Admitting: Emergency Medicine

## 2019-07-23 ENCOUNTER — Emergency Department
Admission: EM | Admit: 2019-07-23 | Discharge: 2019-07-23 | Disposition: A | Discharge status: Home / Self Care | Payer: Medicaid Other | Attending: Emergency Medicine | Admitting: Emergency Medicine

## 2019-07-23 NOTE — Telephone Encounter (Signed)
Called patient due to lwot to inquire about condition and follow up plans. She is doing no better.  She does not have a ua result. Says she was unable to get specimen.  Says she does not have dysuria, but she has urgency and very little urine comes out.  She has emailed her dr and will try to call them.  She will return if her doctor cannot help her.

## 2019-07-23 NOTE — ED Notes (Signed)
No answer when called several times from lobby 

## 2019-07-23 NOTE — ED Notes (Signed)
No answer when called from lobby x1 

## 2019-08-04 ENCOUNTER — Encounter: Payer: Self-pay | Admitting: Radiology

## 2019-08-04 ENCOUNTER — Other Ambulatory Visit: Payer: Self-pay

## 2019-08-04 ENCOUNTER — Emergency Department: Payer: Medicaid Other

## 2019-08-04 ENCOUNTER — Emergency Department
Admission: EM | Admit: 2019-08-04 | Discharge: 2019-08-04 | Disposition: A | Discharge status: Home / Self Care | Payer: Medicaid Other | Attending: Emergency Medicine | Admitting: Emergency Medicine

## 2019-08-04 DIAGNOSIS — Z9114 Patient's other noncompliance with medication regimen: Secondary | ICD-10-CM | POA: Insufficient documentation

## 2019-08-04 DIAGNOSIS — N12 Tubulo-interstitial nephritis, not specified as acute or chronic: Secondary | ICD-10-CM

## 2019-08-04 DIAGNOSIS — Z91040 Latex allergy status: Secondary | ICD-10-CM | POA: Diagnosis not present

## 2019-08-04 DIAGNOSIS — F17210 Nicotine dependence, cigarettes, uncomplicated: Secondary | ICD-10-CM | POA: Diagnosis not present

## 2019-08-04 DIAGNOSIS — Z79899 Other long term (current) drug therapy: Secondary | ICD-10-CM | POA: Diagnosis not present

## 2019-08-04 DIAGNOSIS — J45909 Unspecified asthma, uncomplicated: Secondary | ICD-10-CM | POA: Insufficient documentation

## 2019-08-04 DIAGNOSIS — R109 Unspecified abdominal pain: Secondary | ICD-10-CM | POA: Diagnosis present

## 2019-08-04 DIAGNOSIS — F1910 Other psychoactive substance abuse, uncomplicated: Secondary | ICD-10-CM | POA: Diagnosis not present

## 2019-08-04 DIAGNOSIS — I10 Essential (primary) hypertension: Secondary | ICD-10-CM | POA: Insufficient documentation

## 2019-08-04 DIAGNOSIS — K5900 Constipation, unspecified: Secondary | ICD-10-CM | POA: Diagnosis not present

## 2019-08-04 LAB — LIPASE, BLOOD: Lipase: 19 U/L (ref 11–51)

## 2019-08-04 LAB — COMPREHENSIVE METABOLIC PANEL
ALT: 9 U/L (ref 0–44)
AST: 13 U/L — ABNORMAL LOW (ref 15–41)
Albumin: 3.7 g/dL (ref 3.5–5.0)
Alkaline Phosphatase: 57 U/L (ref 38–126)
Anion gap: 11 (ref 5–15)
BUN: 10 mg/dL (ref 6–20)
CO2: 23 mmol/L (ref 22–32)
Calcium: 9.5 mg/dL (ref 8.9–10.3)
Chloride: 106 mmol/L (ref 98–111)
Creatinine, Ser: 0.87 mg/dL (ref 0.44–1.00)
GFR calc Af Amer: >60 mL/min (ref >60)
GFR calc non Af Amer: >60 mL/min (ref >60)
Glucose, Bld: 114 mg/dL — ABNORMAL HIGH (ref 70–99)
Potassium: 3.1 mmol/L — ABNORMAL LOW (ref 3.5–5.1)
Sodium: 140 mmol/L (ref 135–145)
Total Bilirubin: 0.5 mg/dL (ref 0.3–1.2)
Total Protein: 8 g/dL (ref 6.5–8.1)

## 2019-08-04 LAB — URINE DRUG SCREEN, QUALITATIVE (ARMC ONLY)
Amphetamines, Ur Screen: NOT DETECTED
Barbiturates, Ur Screen: NOT DETECTED
Benzodiazepine, Ur Scrn: POSITIVE — AB
Cannabinoid 50 Ng, Ur ~~LOC~~: POSITIVE — AB
Cocaine Metabolite,Ur ~~LOC~~: POSITIVE — AB
MDMA (Ecstasy)Ur Screen: NOT DETECTED
Methadone Scn, Ur: NOT DETECTED
Opiate, Ur Screen: NOT DETECTED
Phencyclidine (PCP) Ur S: NOT DETECTED
Tricyclic, Ur Screen: POSITIVE — AB

## 2019-08-04 LAB — URINALYSIS, COMPLETE (UACMP) WITH MICROSCOPIC
Bilirubin Urine: NEGATIVE
Glucose, UA: NEGATIVE mg/dL
Hgb urine dipstick: NEGATIVE
Ketones, ur: 20 mg/dL — AB
Nitrite: POSITIVE — AB
Protein, ur: 100 mg/dL — AB
Specific Gravity, Urine: 1.023 (ref 1.005–1.030)
pH: 6 (ref 5.0–8.0)

## 2019-08-04 LAB — CBC
HCT: 43.1 % (ref 36.0–46.0)
Hemoglobin: 14.6 g/dL (ref 12.0–15.0)
MCH: 31.1 pg (ref 26.0–34.0)
MCHC: 33.9 g/dL (ref 30.0–36.0)
MCV: 91.9 fL (ref 80.0–100.0)
Platelets: 378 10*3/uL (ref 150–400)
RBC: 4.69 MIL/uL (ref 3.87–5.11)
RDW: 12.6 % (ref 11.5–15.5)
WBC: 4.6 10*3/uL (ref 4.0–10.5)
nRBC: 0 % (ref 0.0–0.2)

## 2019-08-04 LAB — TROPONIN I (HIGH SENSITIVITY): Troponin I (High Sensitivity): 10 ng/L (ref <18)

## 2019-08-04 MED ORDER — ONDANSETRON HCL 4 MG/2ML IJ SOLN
4.0000 mg | Freq: Once | INTRAMUSCULAR | Status: AC
  Administered 2019-08-04: 02:00:00 4 mg via INTRAVENOUS
  Filled 2019-08-04: qty 2

## 2019-08-04 MED ORDER — HYDROCHLOROTHIAZIDE 25 MG PO TABS
25.0000 mg | ORAL_TABLET | Freq: Once | ORAL | Status: AC
  Administered 2019-08-04: 03:00:00 25 mg via ORAL
  Filled 2019-08-04: qty 1

## 2019-08-04 MED ORDER — MORPHINE SULFATE (PF) 4 MG/ML IV SOLN
4.0000 mg | Freq: Once | INTRAVENOUS | Status: AC
  Administered 2019-08-04: 02:00:00 4 mg via INTRAVENOUS
  Filled 2019-08-04: qty 1

## 2019-08-04 MED ORDER — SODIUM CHLORIDE 0.9 % IV SOLN
1.0000 g | Freq: Once | INTRAVENOUS | Status: AC
  Administered 2019-08-04: 03:00:00 1 g via INTRAVENOUS
  Filled 2019-08-04: qty 10

## 2019-08-04 MED ORDER — ONDANSETRON 4 MG PO TBDP: 4.0000 mg | ORAL_TABLET | Freq: Three times a day (TID) | ORAL | 0 refills | Status: DC | PRN

## 2019-08-04 MED ORDER — CEPHALEXIN 500 MG PO CAPS: 500.0000 mg | ORAL_CAPSULE | Freq: Three times a day (TID) | ORAL | 0 refills | Status: AC

## 2019-08-04 MED ORDER — LISINOPRIL 10 MG PO TABS
20.0000 mg | ORAL_TABLET | Freq: Once | ORAL | Status: AC
  Administered 2019-08-04: 20 mg via ORAL
  Filled 2019-08-04: qty 2

## 2019-08-04 MED ORDER — IOHEXOL 300 MG/ML  SOLN
100.0000 mL | Freq: Once | INTRAMUSCULAR | Status: AC | PRN
  Administered 2019-08-04: 100 mL via INTRAVENOUS

## 2019-08-04 NOTE — ED Notes (Signed)
Peripheral IV discontinued. Catheter intact. No signs of infiltration or redness. Gauze applied to IV site.  Discharge instructions reviewed with patient. Questions fielded by this RN. Patient verbalizes understanding of instructions. Patient discharged home in stable condition per [place]. No acute distress noted at time of discharge.   

## 2019-08-04 NOTE — ED Notes (Signed)
Attempted lab draw unsuccessful  

## 2019-08-04 NOTE — ED Notes (Signed)
Pt calling family for pick up

## 2019-08-04 NOTE — ED Provider Notes (Signed)
[facility] Emergency Department Provider Note  ____________________________________________  Time seen: Approximately 2:53 AM  I have reviewed the triage vital signs and the nursing notes.   HISTORY  Chief Complaint Abdominal Pain   HPI Sarah Sosa is a 39 y.o. female with a history of chronic abdominal pain, GERD, hypertension, cocaine abuse, marijuana abuse, hysterectomy, cholecystectomy who presents for evaluation of abdominal pain.  Patient reports 2 weeks of sharp severe right-sided abdominal pain radiating to her right lower back.  Has had several daily episodes of nonbloody nonbilious emesis.  Has been unable to tolerate p.o.  Has been having normal bowel movements, passing flatus, no constipation or diarrhea, no fever chills, no dysuria or hematuria, no chest pain or shortness of breath.  Has not been taking her medications.  Patient reports recently smoking marijuana laced with cocaine.   Past Medical History:  Diagnosis Date  . Arthritis   . Asthma   . Chronic pain disorder 03/19/2018  . GERD (gastroesophageal reflux disease)   . Headache   . Heart murmur   . History of trichomoniasis 03/19/2018  . Hypertension   . PID (pelvic inflammatory disease) 02/14/2018  . Uterine leiomyoma 03/19/2018    Patient Active Problem List   Diagnosis Date Noted  . Ileus ([place]) 04/21/2018  . Postoperative ileus ([place]) 04/20/2018  . Postoperative state 04/15/2018  . Uterine leiomyoma 03/19/2018  . Menorrhagia with regular cycle 03/19/2018  . Chronic pain disorder 03/19/2018  . Pelvic pain 03/19/2018  . Essential hypertension 03/19/2018    Past Surgical History:  Procedure Laterality Date  . ABDOMINAL HYSTERECTOMY    . CHOLECYSTECTOMY    . DENTAL SURGERY    . HYSTERECTOMY ABDOMINAL WITH SALPINGECTOMY Bilateral 04/15/2018   Procedure: HYSTERECTOMY ABDOMINAL WITH BILATERAL SALPINGECTOMY;  Surgeon: Rubie Maid, MD;  Location: [facility] ORS;  Service: Gynecology;   Laterality: Bilateral;  . kenn surgery Bilateral   . KNEE ARTHROSCOPY Left 2012, 2013    Prior to Admission medications   Medication Sig Start Date End Date Taking? Authorizing Provider  ALPRAZolam (XANAX) 0.25 MG tablet Take 0.25 mg by mouth 2 (two) times daily as needed.     [provider]  cephALEXin (KEFLEX) 500 MG capsule Take 1 capsule (500 mg total) by mouth 3 (three) times daily for 7 days. 08/04/19 08/11/19  Rudene Re, MD  dicyclomine (BENTYL) 20 MG tablet Take 1 tablet (20 mg total) by mouth 3 (three) times daily as needed for spasms. 03/25/19 03/24/20  Duffy Bruce, MD  dicyclomine (BENTYL) 20 MG tablet Take 1 tablet (20 mg total) by mouth 3 (three) times daily as needed for spasms. 06/14/19 07/14/19  Gregor Hams, MD  diphenhydramine-acetaminophen (TYLENOL PM) 25-500 MG TABS tablet Take 2 tablets by mouth at bedtime as needed (sleep).    [provider]  lisinopril-hydrochlorothiazide (ZESTORETIC) 10-12.5 MG tablet Take 1 tablet by mouth daily. Patient taking differently: Take 2 tablets by mouth at bedtime.  04/23/18   Rubie Maid, MD  metoCLOPramide (REGLAN) 10 MG tablet Take 1 tablet (10 mg total) by mouth every 8 (eight) hours as needed for nausea or vomiting (can also be taken for headache). 03/27/19   Earleen Newport, MD  ondansetron (ZOFRAN ODT) 4 MG disintegrating tablet Take 1 tablet (4 mg total) by mouth every 8 (eight) hours as needed. 08/04/19   Rudene Re, MD  oxyCODONE-acetaminophen (PERCOCET) 7.5-325 MG tablet Take 1 tablet by mouth 4 (four) times daily.     [provider]  polyethylene glycol (MIRALAX) packet Take 17 g by mouth daily. Take daily for 2 weeks, then daily prn. Patient taking differently: Take 17 g by mouth daily as needed. Take daily for 2 weeks, then daily prn 04/23/18   Rubie Maid, MD  predniSONE (DELTASONE) 10 MG tablet Take 6 tablets  today, on day 2 take 5 tablets, day 3 take 4 tablets, day 4 take 3  tablets, day 5 take  2 tablets and 1 tablet the last day 05/11/19   Johnn Hai, PA-C    Allergies Latex, Nsaids, Tomato, and Tramadol  Family History  Problem Relation Age of Onset  . Fibroids Mother   . Hypertension Mother   . Arthritis Mother     Social History Social History   Tobacco Use  . Smoking status: Current Some Day Smoker    Packs/day: 0.50    Years: 22.00    Pack years: 11.00    Types: Cigarettes  . Smokeless tobacco: Never Used  Substance Use Topics  . Alcohol use: Yes    Comment: occasional  . Drug use: Yes    Types: Marijuana    Review of Systems  Constitutional: Negative for fever. Eyes: Negative for visual changes. ENT: Negative for sore throat. Neck: No neck pain  Cardiovascular: Negative for chest pain. Respiratory: Negative for shortness of breath. Gastrointestinal: + abdominal pain, vomiting and diarrhea. Genitourinary: Negative for dysuria. Musculoskeletal: Negative for back pain. Skin: Negative for rash. Neurological: Negative for headaches, weakness or numbness. Psych: No SI or HI  ____________________________________________   PHYSICAL EXAM:  VITAL SIGNS: ED Triage Vitals  Enc Vitals Group     BP 08/04/19 0036 (!) 183/121     Pulse Rate 08/04/19 0133 80     Resp 08/04/19 0036 18     Temp 08/04/19 0036 98.3 F (36.8 C)     Temp Source 08/04/19 0036 Oral     SpO2 08/04/19 0036 100 %     Weight 08/04/19 0035 206 lb (93.4 kg)     Height 08/04/19 0035 5\' 7"  (1.702 m)     Head Circumference --      Peak Flow --      Pain Score 08/04/19 0035 9     Pain Loc --      Pain Edu? --      Excl. in [place]? --     Constitutional: Alert and oriented. Well appearing and in no apparent distress. HEENT:      Head: Normocephalic and atraumatic.         Eyes: Conjunctivae are normal. Sclera is non-icteric.       Mouth/Throat: Mucous membranes are moist.       Neck: Supple with no signs of meningismus. Cardiovascular: Regular rate and  rhythm. No murmurs, gallops, or rubs. 2+ symmetrical distal pulses are present in all extremities. No JVD. Respiratory: Normal respiratory effort. Lungs are clear to auscultation bilaterally. No wheezes, crackles, or rhonchi.  Gastrointestinal: Soft, tender to palpation over the RUQ, and non distended with positive bowel sounds. No rebound or guarding. Genitourinary: R CVA tenderness. Musculoskeletal: Nontender with normal range of motion in all extremities. No edema, cyanosis, or erythema of extremities. Neurologic: Normal speech and language. Face is symmetric. Moving all extremities. No gross focal neurologic deficits are appreciated. Skin: Skin is warm, dry and intact. No rash noted. Psychiatric: Mood and affect are normal. Speech and behavior are normal.  ____________________________________________   LABS (all labs ordered are listed, but only abnormal results are  displayed)  Labs Reviewed  COMPREHENSIVE METABOLIC PANEL - Abnormal; Notable for the following components:      Result Value   Potassium 3.1 (*)    Glucose, Bld 114 (*)    AST 13 (*)    All other components within normal limits  URINALYSIS, COMPLETE (UACMP) WITH MICROSCOPIC - Abnormal; Notable for the following components:   Color, Urine AMBER (*)    APPearance CLOUDY (*)    Ketones, ur 20 (*)    Protein, ur 100 (*)    Nitrite POSITIVE (*)    Leukocytes,Ua TRACE (*)    Bacteria, UA MANY (*)    All other components within normal limits  URINE DRUG SCREEN, QUALITATIVE ([facility] ONLY) - Abnormal; Notable for the following components:   Tricyclic, Ur Screen POSITIVE (*)    Cocaine Metabolite,Ur [place] POSITIVE (*)    Cannabinoid 50 Ng, Ur [place] POSITIVE (*)    Benzodiazepine, Ur Scrn POSITIVE (*)    All other components within normal limits  URINE CULTURE  LIPASE, BLOOD  CBC  TROPONIN I (HIGH SENSITIVITY)   ____________________________________________  EKG  ED ECG REPORT I, Rudene Re, the attending physician,  personally viewed and interpreted this ECG.  Normal sinus rhythm, rate of 75, LVH, normal intervals, normal axis, no ST elevations or depressions. ____________________________________________  RADIOLOGY  I have personally reviewed the images performed during this visit and I agree with the Radiologist's read.   Interpretation by Radiologist:  CT ABDOMEN PELVIS W CONTRAST  Result Date: 08/04/2019 CLINICAL DATA:  Initial evaluation for acute abdominal and back pain for 2 weeks, vomiting. History of cholecystectomy. EXAM: CT ABDOMEN AND PELVIS WITH CONTRAST TECHNIQUE: Multidetector CT imaging of the abdomen and pelvis was performed using the standard protocol following bolus administration of intravenous contrast. CONTRAST:  130mL OMNIPAQUE IOHEXOL 300 MG/ML  SOLN COMPARISON:  Prior CT from 03/27/2019. FINDINGS: Lower chest: Small layering left pleural effusion with associated left basilar atelectasis. Visualized lungs are otherwise clear. Hepatobiliary: Few scattered subcentimeter hypodensities noted within the liver, too small the characterize, but could reflect small cysts and/or biliary hamartomas. Liver otherwise unremarkable. Gallbladder surgically absent. No biliary dilatation. Pancreas: Pancreas within normal limits. Spleen: Spleen within normal limits. Adrenals/Urinary Tract: Adrenal glands are normal. Kidneys equal size with symmetric enhancement. Mild multifocal cortical scarring noted about the left kidney. No nephrolithiasis, hydronephrosis or focal enhancing renal mass. No visible hydroureter. Bladder largely decompressed without acute abnormality. Stomach/Bowel: Stomach within normal limits. Similar swirling of the mesenteric vessels within the central and lower abdomen, stable from previous exams. No evidence for bowel obstruction or associated inflammation. No acute inflammatory changes seen about the bowels. Negative appendix. Moderate stool seen within the distal colon, which could  reflect constipation. Vascular/Lymphatic: No adenopathy. Reproductive: Uterus is absent. Left ovary unremarkable. 12 mm right ovarian cyst, likely a normal physiologic follicular cyst. Other: No free air or fluid. Musculoskeletal: No acute osseous abnormality. No discrete lytic or blastic osseous lesions. Moderate multilevel facet degeneration noted within the lumbar spine. IMPRESSION: 1. No CT evidence for acute intra-abdominal or pelvic process. 2. Moderate stool within the distal colon, which could reflect constipation. 3. Small layering left pleural effusion with associated left basilar atelectasis. 4. Status post cholecystectomy and hysterectomy. Electronically Signed   By: Jeannine Boga M.D.   On: 08/04/2019 03:09     ____________________________________________   PROCEDURES  Procedure(s) performed: None Procedures Critical Care performed:  None ____________________________________________   INITIAL IMPRESSION / ASSESSMENT AND PLAN / ED COURSE  39 y.o. female with a history of chronic abdominal pain, GERD, hypertension, cocaine abuse, marijuana abuse, hysterectomy, cholecystectomy who presents for evaluation of abdominal pain, nausea, vomiting x 2 weeks.  Patient is extremely hypertensive with systolics in the [id number], but otherwise in no significant distress, she is tender to palpation the right upper quadrant with no rebound or guarding and right CVA.  Differential diagnoses include SBO versus gastritis versus ischemia due to cocaine versus dissection versus peptic ulcer disease versus pancreatitis versus appendicitis.  We will treat her pain, get labs, urinalysis, EKG, and CT.  Clinical Course as of Aug 04 339  Mon Aug 04, 2019  0315 UA positive for UTI with patient complaining of right-sided pain radiating to her back possibly early pyelonephritis.  No signs of sepsis with no fever and no leukocytosis.  Patient given a dose of Rocephin and will be discharged home on Keflex.   CT showing no evidence of kidney stones or any other acute findings other than constipation.  Will send home on a bowel regimen.   [CV]    Clinical Course User Index [CV] Alfred Levins [place], MD   _________________________ 3:41 AM on 08/04/2019 -----------------------------------------  Drug screen positive for cocaine, cannabinoids and benzos.  Discussed with patient the dangers of using drugs especially with her past medical history and noncompliance with her antihypertensives.  Patient will be discharged home on Zofran and Keflex.  Discussed my standard return precautions.   As part of my medical decision making, I reviewed the following data within the [place] notes reviewed and incorporated, Labs reviewed , EKG interpreted , Old EKG reviewed, Old chart reviewed, Radiograph reviewed , Notes from prior ED visits and [place] Controlled Substance Database   Please note:  Patient was evaluated in Emergency Department today for the symptoms described in the history of present illness. Patient was evaluated in the context of the global COVID-19 pandemic, which necessitated consideration that the patient might be at risk for infection with the SARS-CoV-2 virus that causes COVID-19. Institutional protocols and algorithms that pertain to the evaluation of patients at risk for COVID-19 are in a state of rapid change based on information released by regulatory bodies including the CDC and federal and state organizations. These policies and algorithms were followed during the patient's care in the ED.  Some ED evaluations and interventions may be delayed as a result of limited staffing during the pandemic.   ____________________________________________   FINAL CLINICAL IMPRESSION(S) / ED DIAGNOSES   Final diagnoses:  Pyelonephritis  Constipation, unspecified constipation type  Polysubstance abuse ([place])      NEW MEDICATIONS STARTED DURING THIS VISIT:  ED Discharge  Orders         Ordered    ondansetron (ZOFRAN ODT) 4 MG disintegrating tablet  Every 8 hours PRN     08/04/19 0340    cephALEXin (KEFLEX) 500 MG capsule  3 times daily     08/04/19 0340           Note:  This document was prepared using Dragon voice recognition software and may include unintentional dictation errors.    Alfred Levins, [place], MD 08/04/19 [phone]

## 2019-08-04 NOTE — ED Notes (Signed)
Patient transported to CT 

## 2019-08-04 NOTE — ED Triage Notes (Signed)
Patient reports abdominal and back pain for approximately a 2 week with vomiting.

## 2019-08-04 NOTE — Discharge Instructions (Signed)
Constipation: Take colace twice a day everyday. Take senna once a day at bedtime. Take daily probiotics. Drink plenty of fluids and eat a diet rich in fiber. For the next 3 days take 3600mg or 45ml of 400mg/5ml Milk of Magnesia at bedtime.  ° °

## 2019-08-04 NOTE — ED Notes (Signed)
Unable to find remote for pt, tv turned to A&E as requested

## 2019-08-06 LAB — URINE CULTURE: Culture: >=100000 — AB

## 2019-10-04 IMAGING — CR DG ABDOMEN 2V
1 series · 3 of 3 positions shown · non-contrast
Comparison: 03/25/2019

CLINICAL DATA: Abdominal pain for 2 days

EXAM:
ABDOMEN - 2 VIEW

[Series 1: dg abd 2 views · 0.14mm/px · 3 of 3 slices shown]
[im 1/3]
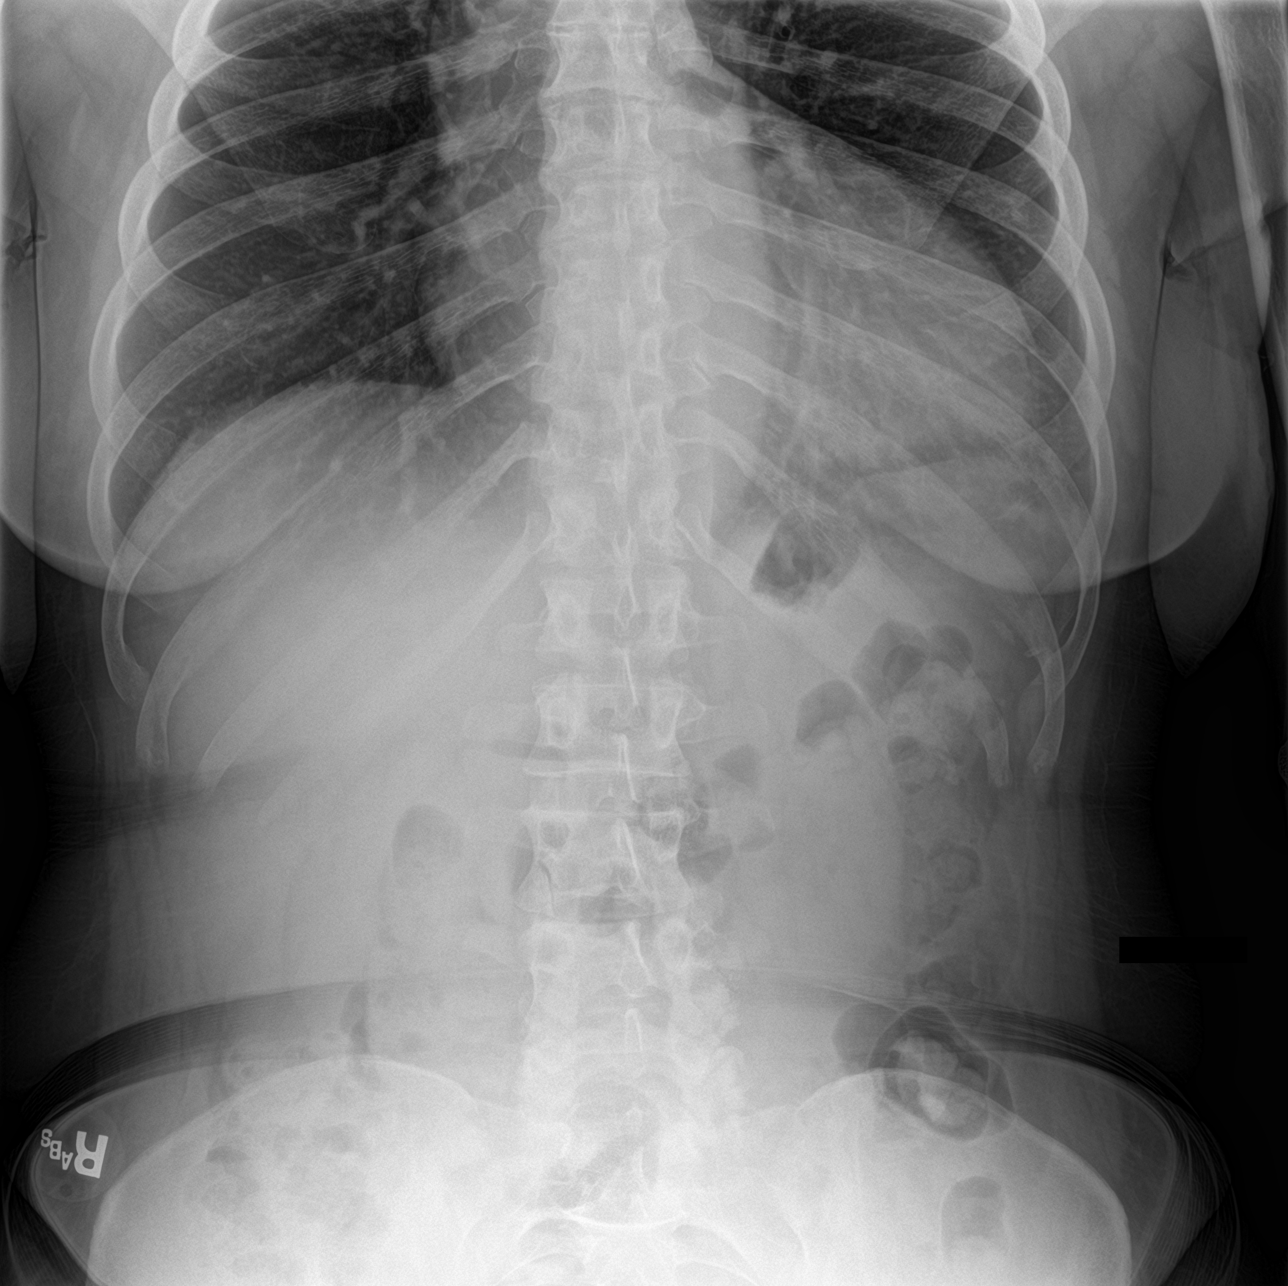
[im 2/3]
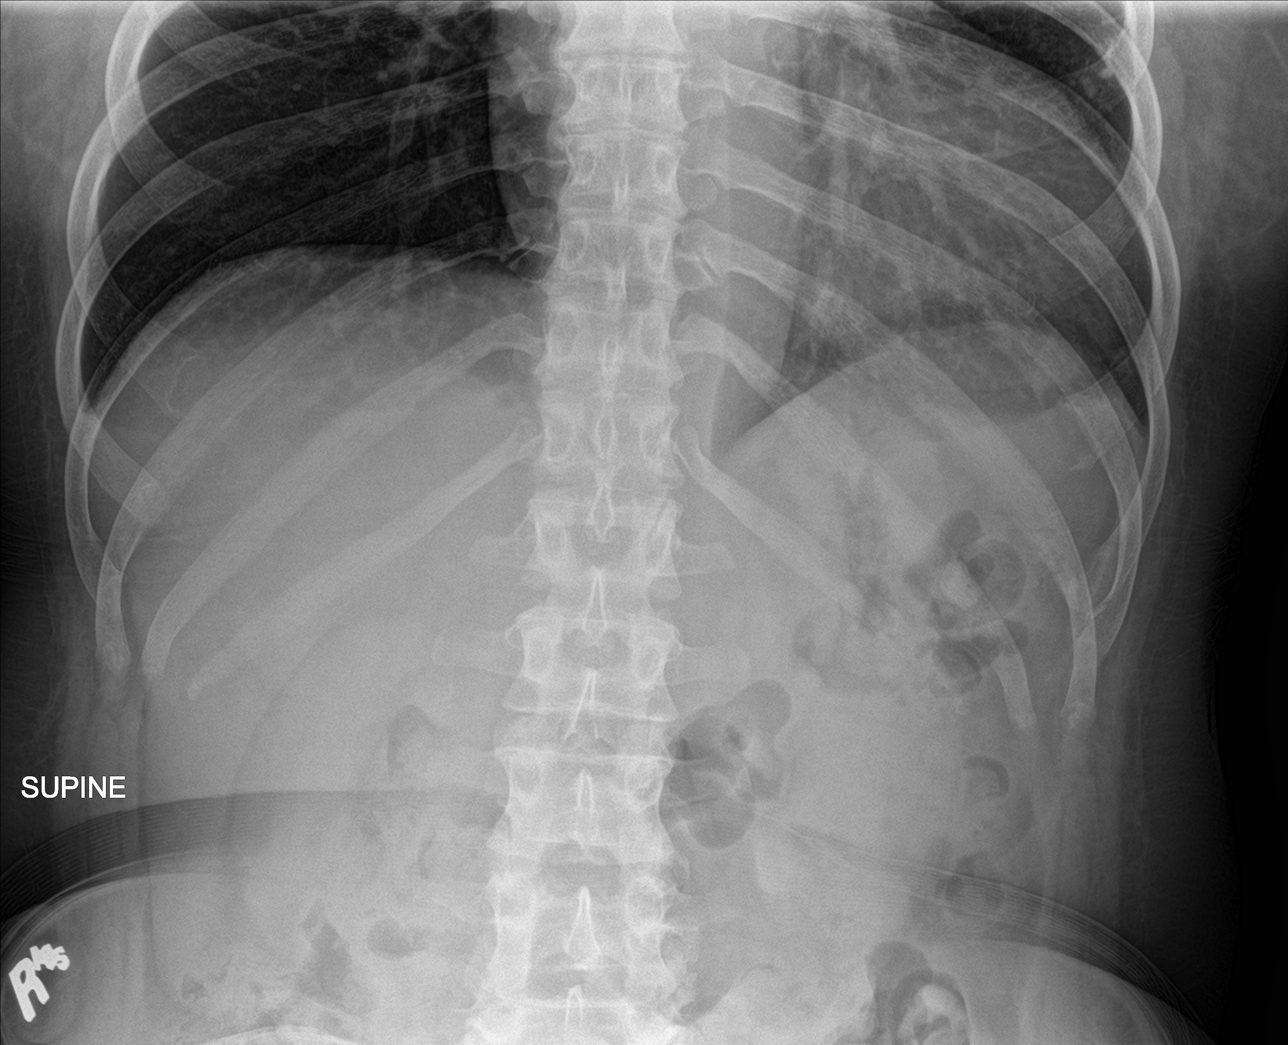
[im 3/3]
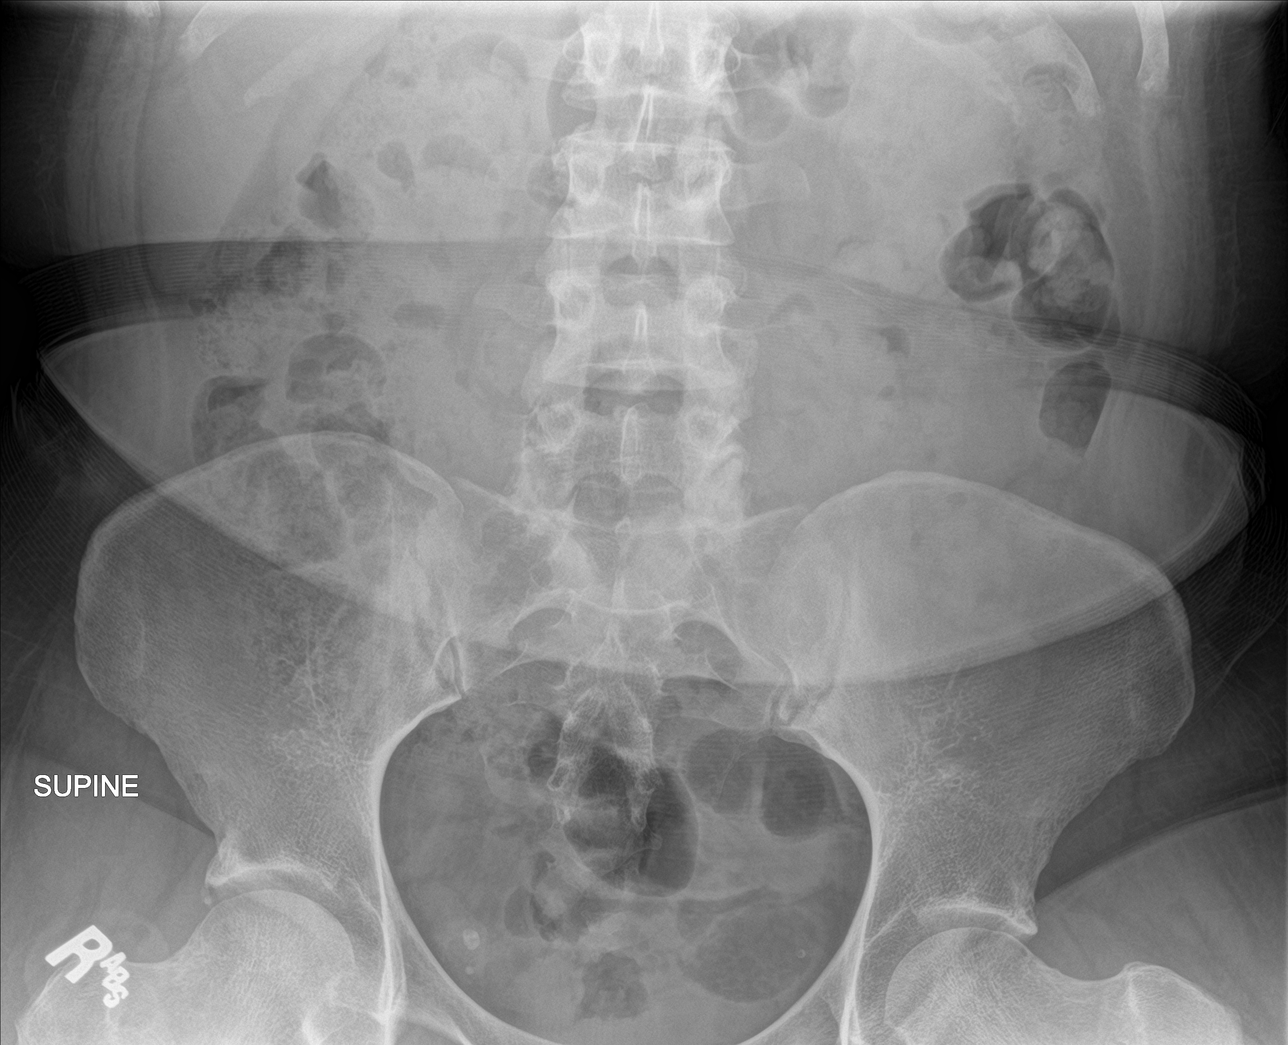

[3 of 3 positions shown; findings below may reference images not displayed]

FINDINGS: Scattered large and small bowel gas is noted. Mild retained fecal
material is seen. No obstructive changes are noted. No free air is
seen. No bony abnormality is noted.
IMPRESSION: Mild retained fecal material which may represent early constipation.
This is relatively stable from prior CT.

## 2019-11-10 ENCOUNTER — Encounter: Payer: Self-pay | Admitting: Emergency Medicine

## 2019-11-10 ENCOUNTER — Emergency Department: Payer: Medicaid Other

## 2019-11-10 ENCOUNTER — Other Ambulatory Visit: Payer: Self-pay

## 2019-11-10 ENCOUNTER — Emergency Department
Admission: EM | Admit: 2019-11-10 | Discharge: 2019-11-10 | Disposition: A | Discharge status: Home / Self Care | Payer: Medicaid Other | Attending: Emergency Medicine | Admitting: Emergency Medicine

## 2019-11-10 DIAGNOSIS — Z79899 Other long term (current) drug therapy: Secondary | ICD-10-CM | POA: Diagnosis not present

## 2019-11-10 DIAGNOSIS — R0602 Shortness of breath: Secondary | ICD-10-CM | POA: Diagnosis not present

## 2019-11-10 DIAGNOSIS — R102 Pelvic and perineal pain: Secondary | ICD-10-CM | POA: Insufficient documentation

## 2019-11-10 DIAGNOSIS — R112 Nausea with vomiting, unspecified: Secondary | ICD-10-CM | POA: Insufficient documentation

## 2019-11-10 DIAGNOSIS — Z91040 Latex allergy status: Secondary | ICD-10-CM | POA: Diagnosis not present

## 2019-11-10 DIAGNOSIS — R0789 Other chest pain: Secondary | ICD-10-CM | POA: Diagnosis present

## 2019-11-10 DIAGNOSIS — I10 Essential (primary) hypertension: Secondary | ICD-10-CM | POA: Insufficient documentation

## 2019-11-10 DIAGNOSIS — R1084 Generalized abdominal pain: Secondary | ICD-10-CM | POA: Diagnosis not present

## 2019-11-10 DIAGNOSIS — F1210 Cannabis abuse, uncomplicated: Secondary | ICD-10-CM | POA: Diagnosis not present

## 2019-11-10 DIAGNOSIS — F17210 Nicotine dependence, cigarettes, uncomplicated: Secondary | ICD-10-CM | POA: Diagnosis not present

## 2019-11-10 LAB — CBC WITH DIFFERENTIAL/PLATELET
Abs Immature Granulocytes: 0.01 10*3/uL (ref 0.00–0.07)
Basophils Absolute: 0 10*3/uL (ref 0.0–0.1)
Basophils Relative: 1 %
Eosinophils Absolute: 0.1 10*3/uL (ref 0.0–0.5)
Eosinophils Relative: 2 %
HCT: 39.9 % (ref 36.0–46.0)
Hemoglobin: 13.5 g/dL (ref 12.0–15.0)
Immature Granulocytes: 0 %
Lymphocytes Relative: 29 %
Lymphs Abs: 1 10*3/uL (ref 0.7–4.0)
MCH: 32.1 pg (ref 26.0–34.0)
MCHC: 33.8 g/dL (ref 30.0–36.0)
MCV: 94.8 fL (ref 80.0–100.0)
Monocytes Absolute: 0.3 10*3/uL (ref 0.1–1.0)
Monocytes Relative: 9 %
Neutro Abs: 2 10*3/uL (ref 1.7–7.7)
Neutrophils Relative %: 59 %
Platelets: 205 10*3/uL (ref 150–400)
RBC: 4.21 MIL/uL (ref 3.87–5.11)
RDW: 12.9 % (ref 11.5–15.5)
WBC: 3.3 10*3/uL — ABNORMAL LOW (ref 4.0–10.5)
nRBC: 0 % (ref 0.0–0.2)

## 2019-11-10 LAB — URINALYSIS, COMPLETE (UACMP) WITH MICROSCOPIC
Bilirubin Urine: NEGATIVE
Glucose, UA: NEGATIVE mg/dL
Ketones, ur: NEGATIVE mg/dL
Leukocytes,Ua: NEGATIVE
Nitrite: NEGATIVE
Protein, ur: 30 mg/dL — AB
Specific Gravity, Urine: 1.015 (ref 1.005–1.030)
pH: 7 (ref 5.0–8.0)

## 2019-11-10 LAB — URINE DRUG SCREEN, QUALITATIVE (ARMC ONLY)
Amphetamines, Ur Screen: NOT DETECTED
Barbiturates, Ur Screen: NOT DETECTED
Benzodiazepine, Ur Scrn: NOT DETECTED
Cannabinoid 50 Ng, Ur ~~LOC~~: POSITIVE — AB
Cocaine Metabolite,Ur ~~LOC~~: NOT DETECTED
MDMA (Ecstasy)Ur Screen: NOT DETECTED
Methadone Scn, Ur: NOT DETECTED
Opiate, Ur Screen: NOT DETECTED
Phencyclidine (PCP) Ur S: NOT DETECTED
Tricyclic, Ur Screen: NOT DETECTED

## 2019-11-10 LAB — TROPONIN I (HIGH SENSITIVITY)
Troponin I (High Sensitivity): 14 ng/L (ref <18)
Troponin I (High Sensitivity): 14 ng/L (ref <18)

## 2019-11-10 LAB — BASIC METABOLIC PANEL
Anion gap: 8 (ref 5–15)
BUN: 11 mg/dL (ref 6–20)
CO2: 24 mmol/L (ref 22–32)
Calcium: 8.6 mg/dL — ABNORMAL LOW (ref 8.9–10.3)
Chloride: 103 mmol/L (ref 98–111)
Creatinine, Ser: 0.81 mg/dL (ref 0.44–1.00)
GFR calc Af Amer: >60 mL/min (ref >60)
GFR calc non Af Amer: >60 mL/min (ref >60)
Glucose, Bld: 118 mg/dL — ABNORMAL HIGH (ref 70–99)
Potassium: 3.4 mmol/L — ABNORMAL LOW (ref 3.5–5.1)
Sodium: 135 mmol/L (ref 135–145)

## 2019-11-10 LAB — PREGNANCY, URINE: Preg Test, Ur: NEGATIVE

## 2019-11-10 MED ORDER — FENTANYL CITRATE (PF) 100 MCG/2ML IJ SOLN
50.0000 ug | INTRAMUSCULAR | Status: DC | PRN
  Administered 2019-11-10: 50 ug via NASAL
  Filled 2019-11-10: qty 2

## 2019-11-10 MED ORDER — KETOROLAC TROMETHAMINE 30 MG/ML IJ SOLN
15.0000 mg | Freq: Once | INTRAMUSCULAR | Status: DC
  Filled 2019-11-10: qty 1

## 2019-11-10 MED ORDER — SODIUM CHLORIDE 0.9 % IV BOLUS
1000.0000 mL | Freq: Once | INTRAVENOUS | Status: AC
  Administered 2019-11-10: 1000 mL via INTRAVENOUS

## 2019-11-10 MED ORDER — DROPERIDOL 2.5 MG/ML IJ SOLN
2.5000 mg | Freq: Once | INTRAMUSCULAR | Status: AC
  Administered 2019-11-10: 2.5 mg via INTRAVENOUS
  Filled 2019-11-10: qty 2

## 2019-11-10 NOTE — ED Provider Notes (Signed)
[facility] Emergency Department Provider Note  ____________________________________________   First MD Initiated Contact with Patient 11/10/19 [phone]     (approximate)  I have reviewed the triage vital signs and the nursing notes.   HISTORY  Chief Complaint Chest Pain, Shortness of Breath, Back Pain, and Abdominal Pain    HPI Sarah Sosa is a 39 y.o. female with medical history as listed below which includes chronic pain disorder, gout chronic pelvic pain, PID which she says was the result of leaving a tampon in for too long and not from SCD, and care everywhere reveals a relatively recent visit to [facility] emergency department for opioid overdose.  She presents tonight for evaluation of what she says is about 3 days of nausea, vomiting, and generalized abdominal pain.  The pain is all over but seems to be worse in the lower part of her abdomen.  She says she has not been able to eat or drink very much because of the vomiting.  She also said that as of the last 24 hours she is having pain that radiates up into the left side of her chest under her breast.  It feels similar but worse compared to prior episodes.  She received fentanyl 50 mcg IV out in the lobby.  She has no history of kidney stones and has no dysuria.  She denies fever.  She said that she has been taking her usual medications at home which includes oxycodone and Zofran but it does not seem to be helping.  She says that she uses marijuana but claims that she does not use it every day.  No other new medications or drugs.   She says that she has no new sexual partners and is not concerned about the possibility of STD.     Past Medical History:  Diagnosis Date  . Arthritis   . Asthma   . Chronic pain disorder 03/19/2018  . GERD (gastroesophageal reflux disease)   . Headache   . Heart murmur   . History of trichomoniasis 03/19/2018  . Hypertension   . PID (pelvic inflammatory disease) 02/14/2018  .  Uterine leiomyoma 03/19/2018    Patient Active Problem List   Diagnosis Date Noted  . Ileus ([place]) 04/21/2018  . Postoperative ileus ([place]) 04/20/2018  . Postoperative state 04/15/2018  . Uterine leiomyoma 03/19/2018  . Menorrhagia with regular cycle 03/19/2018  . Chronic pain disorder 03/19/2018  . Pelvic pain 03/19/2018  . Essential hypertension 03/19/2018    Past Surgical History:  Procedure Laterality Date  . ABDOMINAL HYSTERECTOMY    . CHOLECYSTECTOMY    . DENTAL SURGERY    . HYSTERECTOMY ABDOMINAL WITH SALPINGECTOMY Bilateral 04/15/2018   Procedure: HYSTERECTOMY ABDOMINAL WITH BILATERAL SALPINGECTOMY;  Surgeon: Rubie Maid, MD;  Location: [facility] ORS;  Service: Gynecology;  Laterality: Bilateral;  . kenn surgery Bilateral   . KNEE ARTHROSCOPY Left 2012, 2013    Prior to Admission medications   Medication Sig Start Date End Date Taking? Authorizing Provider  ALPRAZolam (XANAX) 0.25 MG tablet Take 0.25 mg by mouth 2 (two) times daily as needed.     [provider]  dicyclomine (BENTYL) 20 MG tablet Take 1 tablet (20 mg total) by mouth 3 (three) times daily as needed for spasms. 03/25/19 03/24/20  Duffy Bruce, MD  dicyclomine (BENTYL) 20 MG tablet Take 1 tablet (20 mg total) by mouth 3 (three) times daily as needed for spasms. 06/14/19 07/14/19  Gregor Hams, MD  diphenhydramine-acetaminophen (TYLENOL PM) 25-500  MG TABS tablet Take 2 tablets by mouth at bedtime as needed (sleep).    [provider]  lisinopril-hydrochlorothiazide (ZESTORETIC) 10-12.5 MG tablet Take 1 tablet by mouth daily. Patient taking differently: Take 2 tablets by mouth at bedtime.  04/23/18   Rubie Maid, MD  metoCLOPramide (REGLAN) 10 MG tablet Take 1 tablet (10 mg total) by mouth every 8 (eight) hours as needed for nausea or vomiting (can also be taken for headache). 03/27/19   Earleen Newport, MD  ondansetron (ZOFRAN ODT) 4 MG disintegrating tablet Take 1 tablet (4 mg total) by  mouth every 8 (eight) hours as needed. 08/04/19   Rudene Re, MD  oxyCODONE-acetaminophen (PERCOCET) 7.5-325 MG tablet Take 1 tablet by mouth 4 (four) times daily.     [provider]  polyethylene glycol (MIRALAX) packet Take 17 g by mouth daily. Take daily for 2 weeks, then daily prn. Patient taking differently: Take 17 g by mouth daily as needed. Take daily for 2 weeks, then daily prn 04/23/18   Rubie Maid, MD  predniSONE (DELTASONE) 10 MG tablet Take 6 tablets  today, on day 2 take 5 tablets, day 3 take 4 tablets, day 4 take 3 tablets, day 5 take  2 tablets and 1 tablet the last day 05/11/19   Johnn Hai, PA-C    Allergies Latex, Nsaids, Tomato, and Tramadol  Family History  Problem Relation Age of Onset  . Fibroids Mother   . Hypertension Mother   . Arthritis Mother     Social History Social History   Tobacco Use  . Smoking status: Current Some Day Smoker    Packs/day: 0.50    Years: 22.00    Pack years: 11.00    Types: Cigarettes  . Smokeless tobacco: Never Used  Substance Use Topics  . Alcohol use: Yes    Comment: occasional  . Drug use: Yes    Types: Marijuana    Review of Systems Constitutional: No fever/chills Eyes: No visual changes. ENT: No sore throat. Cardiovascular: Pain in chest radiating up from the abdomen. Respiratory: Denies shortness of breath. Gastrointestinal: Abdominal pain, nausea, vomiting. Genitourinary: Negative for dysuria. Musculoskeletal: Negative for neck pain.  Negative for back pain. Integumentary: Negative for rash. Neurological: Negative for headaches, focal weakness or numbness.   ____________________________________________   PHYSICAL EXAM:  VITAL SIGNS: ED Triage Vitals  Enc Vitals Group     BP 11/10/19 0253 (!) 221/114     Pulse Rate 11/10/19 0253 84     Resp 11/10/19 0253 20     Temp 11/10/19 0253 98.6 F (37 C)     Temp Source 11/10/19 0253 Oral     SpO2 11/10/19 0253 100 %     Weight  11/10/19 0250 93.4 kg (206 lb)     Height 11/10/19 0250 1.702 m (5\' 7" )     Head Circumference --      Peak Flow --      Pain Score 11/10/19 0250 9     Pain Loc --      Pain Edu? --      Excl. in [place]? --     Constitutional: Alert and oriented.  Eyes: Conjunctivae are normal.  Head: Atraumatic. Nose: No congestion/rhinnorhea. Mouth/Throat: Patient is wearing a mask. Neck: No stridor.  No meningeal signs.   Cardiovascular: Normal rate, regular rhythm. Good peripheral circulation. Grossly normal heart sounds. Respiratory: Normal respiratory effort.  No retractions. Gastrointestinal: Soft and nondistended.  Mild generalized tenderness to palpation all throughout  the abdomen without any focal tenderness. Musculoskeletal: No lower extremity tenderness nor edema. No gross deformities of extremities. Neurologic:  Normal speech and language. No gross focal neurologic deficits are appreciated.  Skin:  Skin is warm, dry and intact. Psychiatric: Mood and affect are normal. Speech and behavior are normal.  ____________________________________________   LABS (all labs ordered are listed, but only abnormal results are displayed)  Labs Reviewed  CBC WITH DIFFERENTIAL/PLATELET - Abnormal; Notable for the following components:      Result Value   WBC 3.3 (*)    All other components within normal limits  BASIC METABOLIC PANEL - Abnormal; Notable for the following components:   Potassium 3.4 (*)    Glucose, Bld 118 (*)    Calcium 8.6 (*)    All other components within normal limits  URINALYSIS, COMPLETE (UACMP) WITH MICROSCOPIC - Abnormal; Notable for the following components:   Color, Urine STRAW (*)    Hgb urine dipstick TRACE (*)    Protein, ur 30 (*)    Bacteria, UA RARE (*)    All other components within normal limits  PREGNANCY, URINE  URINE DRUG SCREEN, QUALITATIVE ([facility] ONLY)  TROPONIN I (HIGH SENSITIVITY)  TROPONIN I (HIGH SENSITIVITY)    ____________________________________________  EKG  ED ECG REPORT I, Hinda Kehr, the attending physician, personally viewed and interpreted this ECG.  Date: 11/10/2019 EKG Time: 2:49 AM Rate: 88 Rhythm: normal sinus rhythm QRS Axis: normal Intervals: normal ST/T Wave abnormalities: Non-specific ST segment / T-wave changes, but no clear evidence of acute ischemia. Narrative Interpretation: no definitive evidence of acute ischemia; does not meet STEMI criteria.   ____________________________________________  RADIOLOGY I, Hinda Kehr, personally viewed and evaluated these images (plain radiographs) as part of my medical decision making, as well as reviewing the written report by the radiologist.  ED MD interpretation:  No acute abnormalities.  Official radiology report(s): DG Abdomen Acute W/Chest  Result Date: 11/10/2019 CLINICAL DATA:  Generalized abdominal pain with nausea and vomiting. EXAM: DG ABDOMEN ACUTE W/ 1V CHEST COMPARISON:  Abdominal CT 08/04/2019 FINDINGS: Mild cardiomegaly. Possible mild atelectasis on the left where there is mid chest streaky density. Artifact over the thoracic inlet, likely from patient's hair. There is no edema, consolidation, effusion, or pneumothorax. Normal bowel gas pattern. No concerning mass effect or calcification. Degenerative spurring to the lower lumbar spine. IMPRESSION: 1. No acute finding in the chest or abdomen. 2. Cardiomegaly. Electronically Signed   By: Monte Fantasia M.D.   On: 11/10/2019 07:42    ____________________________________________   PROCEDURES   Procedure(s) performed (including Critical Care):  Procedures   ____________________________________________   INITIAL IMPRESSION / MDM / [place] / ED COURSE  As part of my medical decision making, I reviewed the following data within the [facility] notes reviewed and incorporated, Labs reviewed , EKG interpreted , Old chart  reviewed, Patient signed out to , Radiograph reviewed , reviewed Notes from prior ED visits and [place] Controlled Substance Database   Differential diagnosis includes, but is not limited to, cannabinoid hyperemesis syndrome, SBO/ileus, gastroparesis, viral infection, nonspecific gastritis, peptic ulcer disease.  Vital signs are stable and within normal limits.  I reviewed the medical record and saw that she has had multiple CT scans over the last year and there has never been a specific emergent cause of her abdominal pain for which she has had multiple ED presentations both here and at [facility].  I think that her symptoms may be most  consistent with cannabinoid hyperemesis syndrome and that perhaps her marijuana consumption is higher than she initially thought.  She has several opiate prescriptions in the record but also presented to the [facility] emergency department relatively recently with an opioid overdose.  I am reluctant to continue to provide narcotic pain medication.  As an alternative and as treatment for chronic pain, nausea and vomiting, and possible cannabinoid hyperemesis, I am providing droperidol 2.5 mg IV.  I will also give Toradol 15 mg IV; as it is being administered intravenously we do not need to worry about peptic ulcer disease at this time and this is a single relatively small dose.  Rather than repeat another CT scan I am obtaining acute abdomen series of radiographs to look for any evidence of SBO/ileus.  I will reassess once the results are back.       Clinical Course as of Nov 10 803  Mon Nov 10, 2019  0719 Patient's potassium is slightly decreased at 3.4 but only just slightly below the lower limit of normal.  Urinalysis is unremarkable.  Urine drug screen is still pending.  The high-sensitivity troponin is 14 but this is very close to her last troponin on record of 10 which was about 3 months ago.  Given her complaint of chest pain I think it is reasonable and as per protocol to repeat  the troponin to make sure it is not going up and we will plan to repeat that troponin at about 7:45 AM.  Radiographs are pending. Transferring ED care to Dr. Jari Pigg.   [CF]  [phone] Of note, patient declined the Toradol saying that it causes her to itch.  She initially declined the droperidol as well, but it was explained to her that this was the recommended treatment and that additional opioids would not be provided, so she accepted the medication.   [CF]  [id number] No acute abnormalities identified on acute abdomen series.  DG Abdomen Acute W/Chest [CF]  [id number] Transferring ED care to Dr. Jari Pigg to follow up on troponin and reassess.   [CF]    Clinical Course User Index [CF] Hinda Kehr, MD     ____________________________________________  FINAL CLINICAL IMPRESSION(S) / ED DIAGNOSES  Final diagnoses:  Nausea and vomiting, intractability of vomiting not specified, unspecified vomiting type  Generalized abdominal pain     MEDICATIONS GIVEN DURING THIS VISIT:  Medications  fentaNYL (SUBLIMAZE) injection 50 mcg (50 mcg Nasal Given 11/10/19 0418)  ketorolac (TORADOL) 30 MG/ML injection 15 mg (15 mg Intravenous Refused 11/10/19 0701)  droperidol (INAPSINE) 2.5 MG/ML injection 2.5 mg (2.5 mg Intravenous Given 11/10/19 0655)  sodium chloride 0.9 % bolus 1,000 mL (1,000 mLs Intravenous New Bag/Given 11/10/19 [id number])     ED Discharge Orders    None      *Please note:  Sarah Sosa was evaluated in Emergency Department on 11/10/2019 for the symptoms described in the history of present illness. She was evaluated in the context of the global COVID-19 pandemic, which necessitated consideration that the patient might be at risk for infection with the SARS-CoV-2 virus that causes COVID-19. Institutional protocols and algorithms that pertain to the evaluation of patients at risk for COVID-19 are in a state of rapid change based on information released by regulatory bodies including the CDC and federal and  state organizations. These policies and algorithms were followed during the patient's care in the ED.  Some ED evaluations and interventions may be delayed as a result of limited staffing during the pandemic.*  Note:  This document was prepared using Dragon voice recognition software and may include unintentional dictation errors.   Hinda Kehr, MD 11/10/19 [phone]

## 2019-11-10 NOTE — ED Triage Notes (Signed)
Patient with complaint of lower back and pelvic pain times 4 days. Patient denies nausea, vomiting, diarrhea or urinary symptoms. Patient also states that she has chest pain and shortness of breath that started three days ago.

## 2019-11-10 NOTE — ED Notes (Signed)
MD at bedside. 

## 2019-11-10 NOTE — ED Notes (Signed)
This RN entered room to assess pt's vitals. Pt sleeping and appears to be in NAD when this RN entered room. Respirations even and unlabored at this time.

## 2019-11-10 NOTE — Discharge Instructions (Addendum)
Your work-up was reassuring and your labs show no evidence of heart attack.  Return to the ER for worsening symptoms or any other concerns

## 2019-11-10 NOTE — ED Notes (Signed)
Pt resting comfortably at this time. Pt respirations even and unlabored. Pt appears to be in NAD at this time.

## 2019-11-10 NOTE — ED Provider Notes (Signed)
7:20 AM Assumed care for off going team.   Blood pressure (!) 194/116, pulse 74, temperature 98.6 F (37 C), temperature source Oral, resp. rate 20, height 5\' 7"  (1.702 m), weight 93.4 kg, last menstrual period 03/25/2018, SpO2 99 %.  See their HPI for full report but in brief   History of chronic pain, gastroparesis, hyperemesis-->comes in with similar symptoms. Many CTs in past.  Got fentanyl in triage. Now getting toradol, droperidol --> refusing these meds. No opioids.   Cardiac markers are negative x2.  No evidence of anemia.  Chest x-ray and abdomen x-ray are negative  Went to the room around 9 AM and patient was asleep.  Attempted to wake patient up and after some tactile stimulation patient awoke.  Patient states she still had some discomfort but was drifting off to sleep.  I received a phone call so had to step out of the room.  9:32 AM reevaluated patient and patient woke up with me entering the room and stating her name.  Patient states that her pain is better.  Repeat lung exam shows clear lungs and her abdomen is soft and nontender.  Patient states that she just felt sleepy.  I asked patient if she felt comfortable going home at this time given patient's had multiple CT scans in the past and we are trying to prevent additional radiation.  Patient stated that she felt comfortable with this plan and that she had someone to come pick her up.  Patient understands that if her symptoms are worsening she will return to the ER for reevaluation.  I discussed the provisional nature of ED diagnosis, the treatment so far, the ongoing plan of care, follow up appointments and return precautions with the patient and any family or support people present. They expressed understanding and agreed with the plan, discharged home.           Vanessa Victor, MD 11/10/19 [phone]

## 2019-11-10 NOTE — ED Notes (Signed)
Pt has had three separate attempts for blood. One by Jenny Reichmann EDT, two by this RN, and two by lab. IV team consult has already been placed at this time. Pt states that pain medication helped for a while but is now wearing off.

## 2019-11-10 NOTE — ED Notes (Signed)
E-signature not working at this time. Pt verbalized understanding of D/C instructions, prescriptions and follow up care with no further questions at this time. Pt in NAD and ambulatory at time of D/C.  

## 2019-11-18 IMAGING — DX DG HAND COMPLETE 3+V*R*
3 series · 3 of 3 positions shown · non-contrast
Comparison: None.

CLINICAL DATA: Pain laterally

EXAM:
RIGHT HAND - COMPLETE 3+ VIEW

[hand ap]
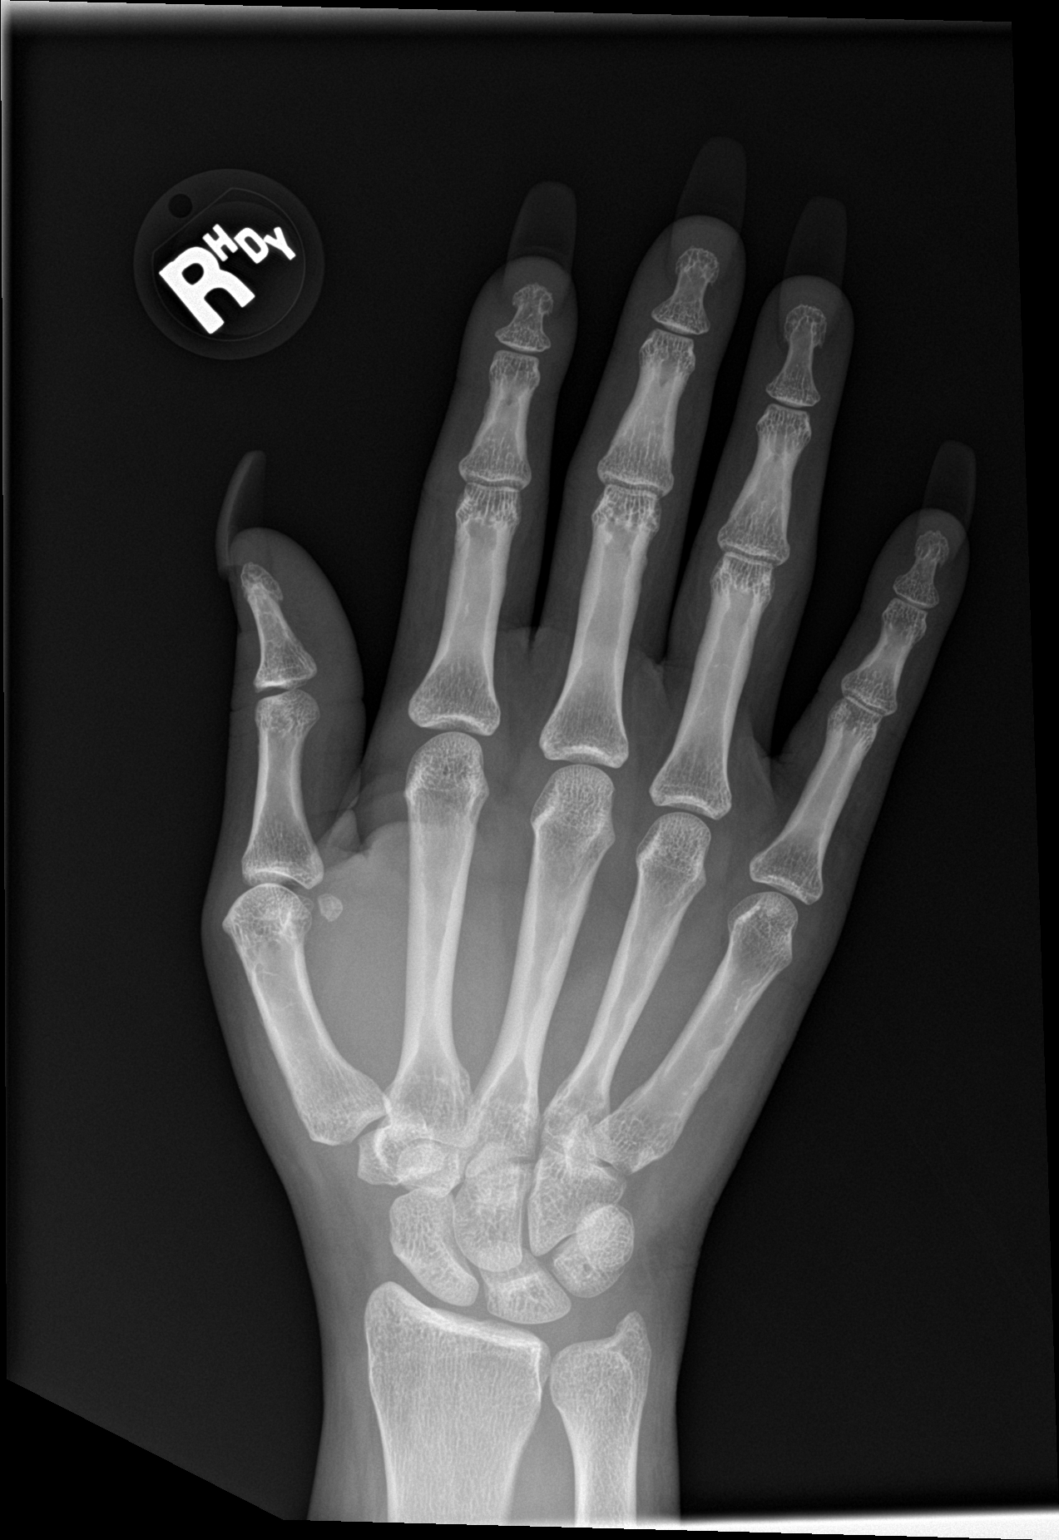

[hand obl]
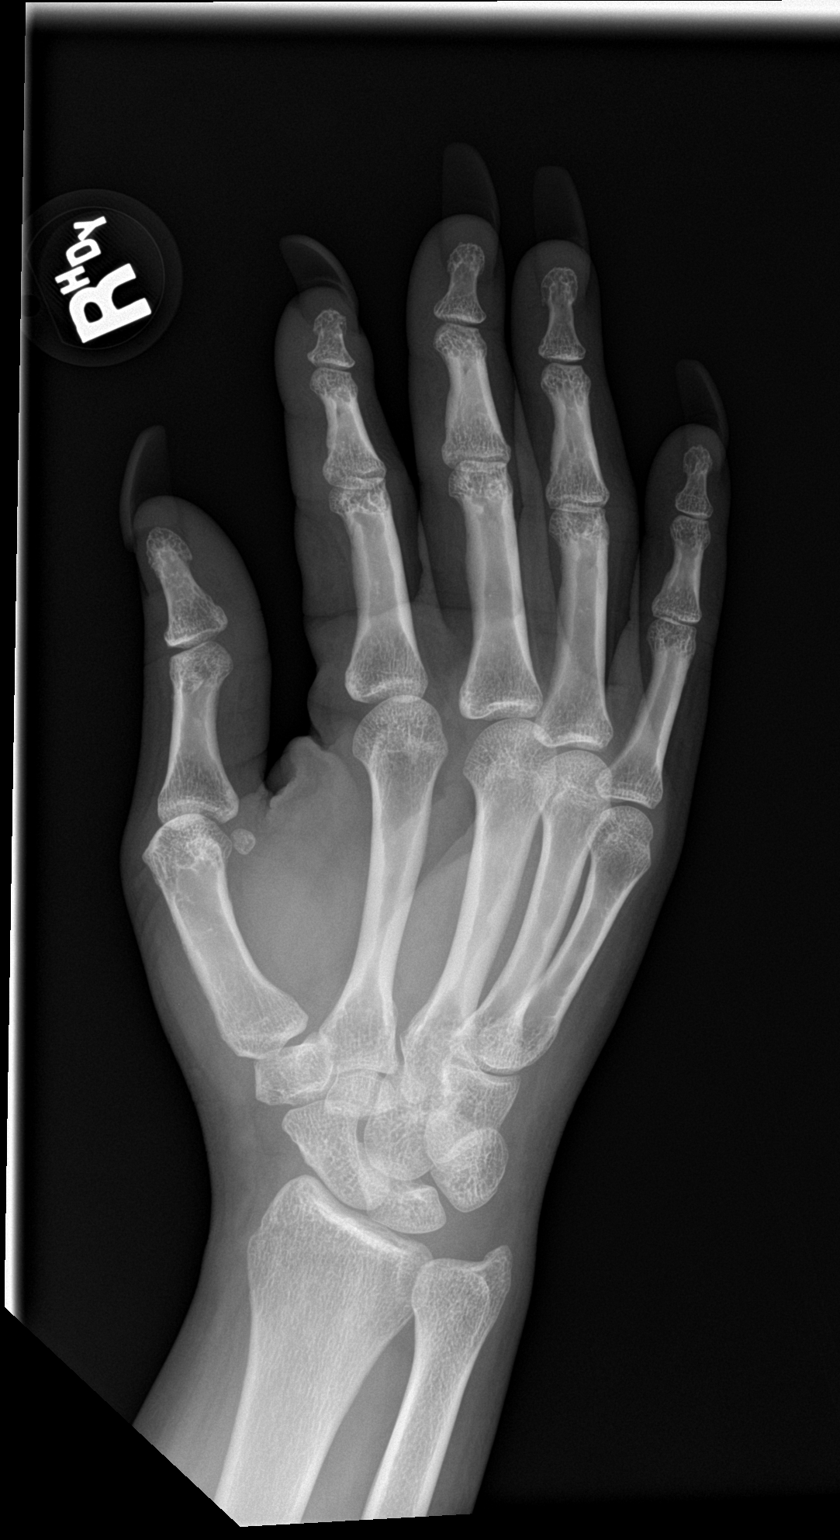

[hand lat]
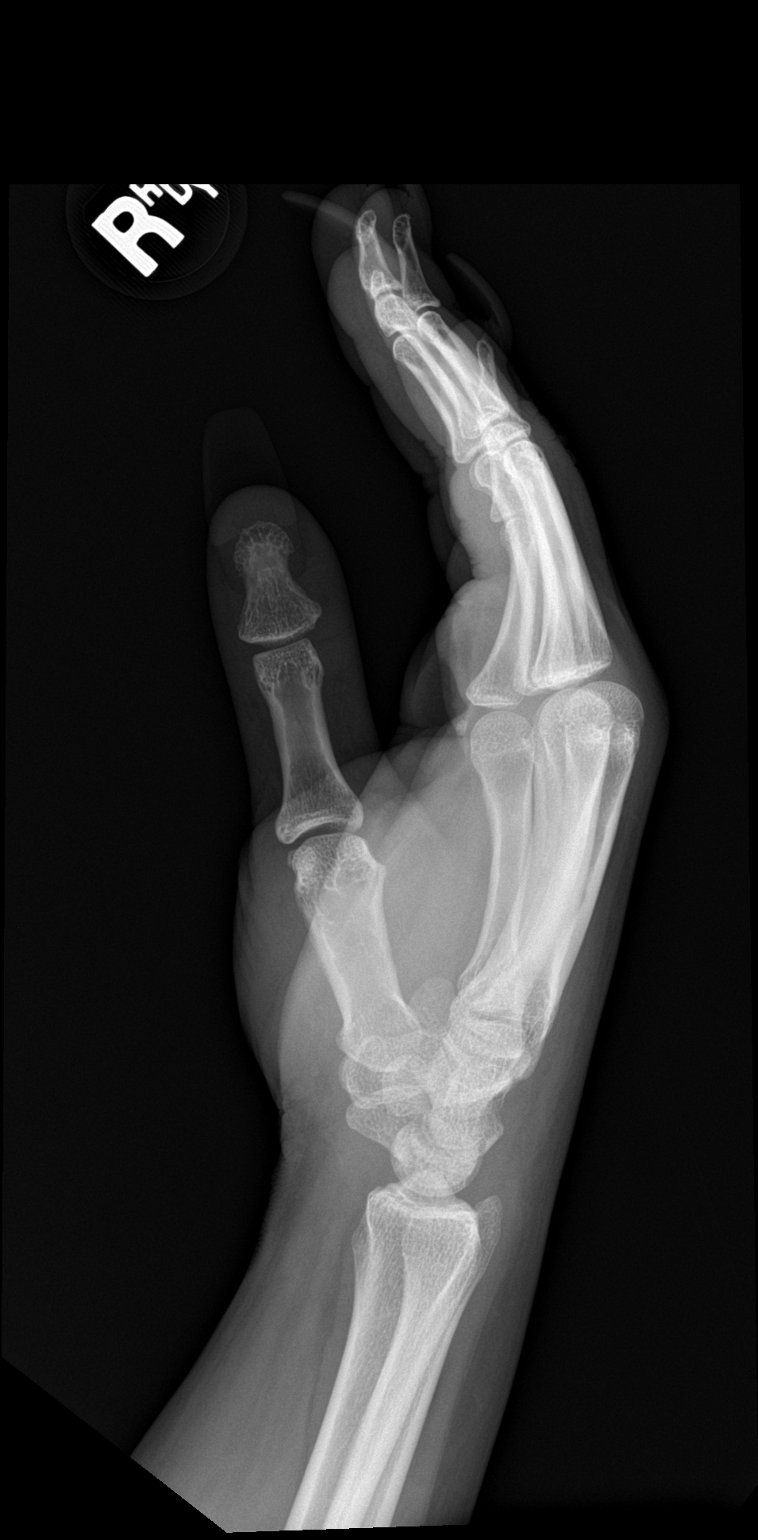

[3 of 3 positions shown; findings below may reference images not displayed]

FINDINGS: Frontal, oblique, and lateral views were obtained. No fracture or
dislocation. Joint spaces appear normal. No erosive change.
IMPRESSION: No fracture or dislocation.  No evident arthropathy.

## 2019-12-09 ENCOUNTER — Other Ambulatory Visit: Payer: Self-pay

## 2019-12-09 ENCOUNTER — Inpatient Hospital Stay
Admission: EM | Admit: 2019-12-09 | Discharge: 2019-12-11 | DRG: 440 | Disposition: A | Discharge status: Home / Self Care | Payer: Medicaid Other | Attending: Internal Medicine | Admitting: Internal Medicine

## 2019-12-09 DIAGNOSIS — K853 Drug induced acute pancreatitis without necrosis or infection: Secondary | ICD-10-CM | POA: Diagnosis not present

## 2019-12-09 DIAGNOSIS — Z720 Tobacco use: Secondary | ICD-10-CM

## 2019-12-09 DIAGNOSIS — T502X5A Adverse effect of carbonic-anhydrase inhibitors, benzothiadiazides and other diuretics, initial encounter: Secondary | ICD-10-CM | POA: Diagnosis present

## 2019-12-09 DIAGNOSIS — F17210 Nicotine dependence, cigarettes, uncomplicated: Secondary | ICD-10-CM | POA: Diagnosis present

## 2019-12-09 DIAGNOSIS — F17200 Nicotine dependence, unspecified, uncomplicated: Secondary | ICD-10-CM | POA: Diagnosis not present

## 2019-12-09 DIAGNOSIS — I10 Essential (primary) hypertension: Secondary | ICD-10-CM | POA: Diagnosis present

## 2019-12-09 DIAGNOSIS — J45909 Unspecified asthma, uncomplicated: Secondary | ICD-10-CM | POA: Diagnosis present

## 2019-12-09 DIAGNOSIS — I160 Hypertensive urgency: Secondary | ICD-10-CM | POA: Diagnosis present

## 2019-12-09 DIAGNOSIS — Z90710 Acquired absence of both cervix and uterus: Secondary | ICD-10-CM

## 2019-12-09 DIAGNOSIS — Z79899 Other long term (current) drug therapy: Secondary | ICD-10-CM | POA: Diagnosis not present

## 2019-12-09 DIAGNOSIS — K859 Acute pancreatitis without necrosis or infection, unspecified: Principal | ICD-10-CM | POA: Diagnosis present

## 2019-12-09 DIAGNOSIS — Z20822 Contact with and (suspected) exposure to covid-19: Secondary | ICD-10-CM | POA: Diagnosis present

## 2019-12-09 DIAGNOSIS — K219 Gastro-esophageal reflux disease without esophagitis: Secondary | ICD-10-CM | POA: Diagnosis present

## 2019-12-09 DIAGNOSIS — R197 Diarrhea, unspecified: Secondary | ICD-10-CM | POA: Diagnosis present

## 2019-12-09 HISTORY — DX: Acute pancreatitis without necrosis or infection, unspecified: K85.90

## 2019-12-09 LAB — COMPREHENSIVE METABOLIC PANEL
ALT: 11 U/L (ref 0–44)
AST: 18 U/L (ref 15–41)
Albumin: 3.4 g/dL — ABNORMAL LOW (ref 3.5–5.0)
Alkaline Phosphatase: 44 U/L (ref 38–126)
Anion gap: 9 (ref 5–15)
BUN: 8 mg/dL (ref 6–20)
CO2: 20 mmol/L — ABNORMAL LOW (ref 22–32)
Calcium: 9.1 mg/dL (ref 8.9–10.3)
Chloride: 110 mmol/L (ref 98–111)
Creatinine, Ser: 0.73 mg/dL (ref 0.44–1.00)
GFR calc Af Amer: >60 mL/min (ref >60)
GFR calc non Af Amer: >60 mL/min (ref >60)
Glucose, Bld: 108 mg/dL — ABNORMAL HIGH (ref 70–99)
Potassium: 3.5 mmol/L (ref 3.5–5.1)
Sodium: 139 mmol/L (ref 135–145)
Total Bilirubin: 0.6 mg/dL (ref 0.3–1.2)
Total Protein: 7 g/dL (ref 6.5–8.1)

## 2019-12-09 LAB — CBC
HCT: 39.8 % (ref 36.0–46.0)
Hemoglobin: 13 g/dL (ref 12.0–15.0)
MCH: 32 pg (ref 26.0–34.0)
MCHC: 32.7 g/dL (ref 30.0–36.0)
MCV: 98 fL (ref 80.0–100.0)
Platelets: 223 10*3/uL (ref 150–400)
RBC: 4.06 MIL/uL (ref 3.87–5.11)
RDW: 13.1 % (ref 11.5–15.5)
WBC: 4.3 10*3/uL (ref 4.0–10.5)
nRBC: 0 % (ref 0.0–0.2)

## 2019-12-09 LAB — LIPASE, BLOOD: Lipase: 145 U/L — ABNORMAL HIGH (ref 11–51)

## 2019-12-09 LAB — MAGNESIUM: Magnesium: 1.8 mg/dL (ref 1.7–2.4)

## 2019-12-09 MED ORDER — LACTATED RINGERS IV BOLUS
1000.0000 mL | Freq: Once | INTRAVENOUS | Status: AC
  Administered 2019-12-09: 1000 mL via INTRAVENOUS

## 2019-12-09 MED ORDER — ONDANSETRON HCL 4 MG/2ML IJ SOLN
4.0000 mg | Freq: Once | INTRAMUSCULAR | Status: AC
  Administered 2019-12-09: 4 mg via INTRAVENOUS
  Filled 2019-12-09: qty 2

## 2019-12-09 MED ORDER — HYDROMORPHONE HCL 1 MG/ML IJ SOLN
1.0000 mg | INTRAMUSCULAR | Status: DC | PRN
  Administered 2019-12-10 (×3): 1 mg via INTRAVENOUS
  Filled 2019-12-09 (×3): qty 1

## 2019-12-09 MED ORDER — SODIUM CHLORIDE 0.9 % IV SOLN: INTRAVENOUS | Status: DC

## 2019-12-09 MED ORDER — ALPRAZOLAM 0.25 MG PO TABS: 0.2500 mg | ORAL_TABLET | Freq: Two times a day (BID) | ORAL | Status: DC | PRN

## 2019-12-09 MED ORDER — TRAZODONE HCL 50 MG PO TABS: 25.0000 mg | ORAL_TABLET | Freq: Every evening | ORAL | Status: DC | PRN

## 2019-12-09 MED ORDER — POLYETHYLENE GLYCOL 3350 17 G PO PACK: 17.0000 g | PACK | Freq: Every day | ORAL | Status: DC | PRN

## 2019-12-09 MED ORDER — DIPHENHYDRAMINE-APAP (SLEEP) 25-500 MG PO TABS: 2.0000 | ORAL_TABLET | Freq: Every evening | ORAL | Status: DC | PRN

## 2019-12-09 MED ORDER — MORPHINE SULFATE (PF) 4 MG/ML IV SOLN
4.0000 mg | Freq: Once | INTRAVENOUS | Status: AC
  Administered 2019-12-09: 4 mg via INTRAVENOUS
  Filled 2019-12-09: qty 1

## 2019-12-09 MED ORDER — ONDANSETRON HCL 4 MG/2ML IJ SOLN
4.0000 mg | Freq: Four times a day (QID) | INTRAMUSCULAR | Status: DC | PRN
  Administered 2019-12-10 – 2019-12-11 (×3): 4 mg via INTRAVENOUS
  Filled 2019-12-09 (×3): qty 2

## 2019-12-09 MED ORDER — SODIUM CHLORIDE 0.9% FLUSH: 3.0000 mL | Freq: Once | INTRAVENOUS | Status: DC

## 2019-12-09 MED ORDER — ACETAMINOPHEN 650 MG RE SUPP: 650.0000 mg | Freq: Four times a day (QID) | RECTAL | Status: DC | PRN

## 2019-12-09 MED ORDER — LISINOPRIL 20 MG PO TABS
20.0000 mg | ORAL_TABLET | Freq: Every day | ORAL | Status: DC
  Administered 2019-12-11: 20 mg via ORAL
  Filled 2019-12-09: qty 2
  Filled 2019-12-09: qty 1

## 2019-12-09 MED ORDER — DICYCLOMINE HCL 20 MG PO TABS
20.0000 mg | ORAL_TABLET | Freq: Three times a day (TID) | ORAL | Status: DC | PRN
  Administered 2019-12-10: 20 mg via ORAL

## 2019-12-09 MED ORDER — ENOXAPARIN SODIUM 40 MG/0.4ML ~~LOC~~ SOLN
40.0000 mg | SUBCUTANEOUS | Status: DC
  Administered 2019-12-10 – 2019-12-11 (×2): 40 mg via SUBCUTANEOUS
  Filled 2019-12-09 (×2): qty 0.4

## 2019-12-09 MED ORDER — OXYCODONE-ACETAMINOPHEN 7.5-325 MG PO TABS
1.0000 | ORAL_TABLET | Freq: Four times a day (QID) | ORAL | Status: DC
  Administered 2019-12-09 – 2019-12-11 (×7): 1 via ORAL
  Filled 2019-12-09 (×7): qty 1

## 2019-12-09 MED ORDER — ONDANSETRON HCL 4 MG PO TABS: 4.0000 mg | ORAL_TABLET | Freq: Four times a day (QID) | ORAL | Status: DC | PRN

## 2019-12-09 MED ORDER — DICYCLOMINE HCL 20 MG PO TABS
20.0000 mg | ORAL_TABLET | Freq: Three times a day (TID) | ORAL | Status: DC | PRN
  Filled 2019-12-09: qty 1

## 2019-12-09 MED ORDER — ONDANSETRON 4 MG PO TBDP: 4.0000 mg | ORAL_TABLET | Freq: Three times a day (TID) | ORAL | Status: DC | PRN

## 2019-12-09 MED ORDER — MAGNESIUM HYDROXIDE 400 MG/5ML PO SUSP: 30.0000 mL | Freq: Every day | ORAL | Status: DC | PRN

## 2019-12-09 MED ORDER — LABETALOL HCL 5 MG/ML IV SOLN: 20.0000 mg | INTRAVENOUS | Status: DC | PRN

## 2019-12-09 MED ORDER — ACETAMINOPHEN 325 MG PO TABS
650.0000 mg | ORAL_TABLET | Freq: Four times a day (QID) | ORAL | Status: DC | PRN
  Administered 2019-12-10 – 2019-12-11 (×2): 650 mg via ORAL
  Filled 2019-12-09 (×2): qty 2

## 2019-12-09 MED ORDER — METOCLOPRAMIDE HCL 10 MG PO TABS: 10.0000 mg | ORAL_TABLET | Freq: Three times a day (TID) | ORAL | Status: DC | PRN

## 2019-12-09 NOTE — ED Notes (Signed)
Additional green tube for mag level sent to lab.

## 2019-12-09 NOTE — ED Triage Notes (Signed)
PT to ED via POV from home c/o abd pain. PT was dx with pancreatitis at [facility] last week, d/c on abx. Has finshed abx and has continued pain, N/V/D. No fever.

## 2019-12-09 NOTE — ED Provider Notes (Signed)
[facility] Emergency Department Provider Note   ____________________________________________   First MD Initiated Contact with Patient 12/09/19 2011     (approximate)  I have reviewed the triage vital signs and the nursing notes.   HISTORY  Chief Complaint Abdominal Pain    HPI Sarah Sosa is a 39 y.o. female with possible history of hypertension, asthma, and chronic pain who presents to the ED complaining of abdominal pain.  Patient reports that she has had 2 to 3 days of worsening epigastric abdominal pain.  She describes it as a constant cramping that has now become severe.  She dealt with similar pain last week, when she was evaluated at [facility] and had a CT scan consistent with pancreatitis.  She was initially feeling better when discharged from [facility], but pain has since worsened and she has been unable to keep down any solids.  She does state that she can keep down small sips of water.  She denies any fevers, cough, chest pain, shortness of breath, dysuria, or hematuria.  She does not drink alcohol and is status post cholecystectomy as well as hysterectomy.        Past Medical History:  Diagnosis Date  . Arthritis   . Asthma   . Chronic pain disorder 03/19/2018  . GERD (gastroesophageal reflux disease)   . Headache   . Heart murmur   . History of trichomoniasis 03/19/2018  . Hypertension   . Pancreatitis   . PID (pelvic inflammatory disease) 02/14/2018  . Uterine leiomyoma 03/19/2018    Patient Active Problem List   Diagnosis Date Noted  . Acute pancreatitis 12/09/2019  . Ileus ([place]) 04/21/2018  . Postoperative ileus ([place]) 04/20/2018  . Postoperative state 04/15/2018  . Uterine leiomyoma 03/19/2018  . Menorrhagia with regular cycle 03/19/2018  . Chronic pain disorder 03/19/2018  . Pelvic pain 03/19/2018  . Essential hypertension 03/19/2018    Past Surgical History:  Procedure Laterality Date  . ABDOMINAL HYSTERECTOMY    . CHOLECYSTECTOMY     . DENTAL SURGERY    . HYSTERECTOMY ABDOMINAL WITH SALPINGECTOMY Bilateral 04/15/2018   Procedure: HYSTERECTOMY ABDOMINAL WITH BILATERAL SALPINGECTOMY;  Surgeon: Rubie Maid, MD;  Location: [facility] ORS;  Service: Gynecology;  Laterality: Bilateral;  . kenn surgery Bilateral   . KNEE ARTHROSCOPY Left 2012, 2013    Prior to Admission medications   Medication Sig Start Date End Date Taking? Authorizing Provider  ALPRAZolam (XANAX) 0.25 MG tablet Take 0.25 mg by mouth 2 (two) times daily as needed.     [provider]  dicyclomine (BENTYL) 20 MG tablet Take 1 tablet (20 mg total) by mouth 3 (three) times daily as needed for spasms. 03/25/19 03/24/20  Duffy Bruce, MD  dicyclomine (BENTYL) 20 MG tablet Take 1 tablet (20 mg total) by mouth 3 (three) times daily as needed for spasms. 06/14/19 07/14/19  Gregor Hams, MD  diphenhydramine-acetaminophen (TYLENOL PM) 25-500 MG TABS tablet Take 2 tablets by mouth at bedtime as needed (sleep).    [provider]  lisinopril-hydrochlorothiazide (ZESTORETIC) 10-12.5 MG tablet Take 1 tablet by mouth daily. Patient taking differently: Take 2 tablets by mouth at bedtime.  04/23/18   Rubie Maid, MD  metoCLOPramide (REGLAN) 10 MG tablet Take 1 tablet (10 mg total) by mouth every 8 (eight) hours as needed for nausea or vomiting (can also be taken for headache). 03/27/19   Earleen Newport, MD  ondansetron (ZOFRAN ODT) 4 MG disintegrating tablet Take 1 tablet (4 mg total) by  mouth every 8 (eight) hours as needed. 08/04/19   Rudene Re, MD  oxyCODONE-acetaminophen (PERCOCET) 7.5-325 MG tablet Take 1 tablet by mouth 4 (four) times daily.     [provider]  polyethylene glycol (MIRALAX) packet Take 17 g by mouth daily. Take daily for 2 weeks, then daily prn. Patient taking differently: Take 17 g by mouth daily as needed. Take daily for 2 weeks, then daily prn 04/23/18   Rubie Maid, MD  predniSONE (DELTASONE) 10 MG tablet Take  6 tablets  today, on day 2 take 5 tablets, day 3 take 4 tablets, day 4 take 3 tablets, day 5 take  2 tablets and 1 tablet the last day 05/11/19   Johnn Hai, PA-C    Allergies Latex, Nsaids, Tomato, and Tramadol  Family History  Problem Relation Age of Onset  . Fibroids Mother   . Hypertension Mother   . Arthritis Mother     Social History Social History   Tobacco Use  . Smoking status: Current Some Day Smoker    Packs/day: 0.50    Years: 22.00    Pack years: 11.00    Types: Cigarettes  . Smokeless tobacco: Never Used  Substance Use Topics  . Alcohol use: Yes    Comment: occasional  . Drug use: Yes    Types: Marijuana    Review of Systems  Constitutional: No fever/chills Eyes: No visual changes. ENT: No sore throat. Cardiovascular: Denies chest pain. Respiratory: Denies shortness of breath. Gastrointestinal: Positive for abdominal pain.  Positive for nausea and vomiting.  No diarrhea.  No constipation. Genitourinary: Negative for dysuria. Musculoskeletal: Negative for back pain. Skin: Negative for rash. Neurological: Negative for headaches, focal weakness or numbness.  ____________________________________________   PHYSICAL EXAM:  VITAL SIGNS: ED Triage Vitals  Enc Vitals Group     BP 12/09/19 1512 (!) 170/86     Pulse Rate 12/09/19 1512 71     Resp 12/09/19 1512 20     Temp 12/09/19 1512 98.2 F (36.8 C)     Temp src --      SpO2 12/09/19 1512 100 %     Weight 12/09/19 1513 208 lb (94.3 kg)     Height 12/09/19 1513 5\' 7"  (1.702 m)     Head Circumference --      Peak Flow --      Pain Score 12/09/19 1513 10     Pain Loc --      Pain Edu? --      Excl. in [place]? --     Constitutional: Alert and oriented. Eyes: Conjunctivae are normal. Head: Atraumatic. Nose: No congestion/rhinnorhea. Mouth/Throat: Mucous membranes are moist. Neck: Normal ROM Cardiovascular: Normal rate, regular rhythm. Grossly normal heart sounds. Respiratory: Normal  respiratory effort.  No retractions. Lungs CTAB. Gastrointestinal: Soft and tender to palpation in the epigastrium with no rebound or guarding. No distention. Genitourinary: deferred Musculoskeletal: No lower extremity tenderness nor edema. Neurologic:  Normal speech and language. No gross focal neurologic deficits are appreciated. Skin:  Skin is warm, dry and intact. No rash noted. Psychiatric: Mood and affect are normal. Speech and behavior are normal.  ____________________________________________   LABS (all labs ordered are listed, but only abnormal results are displayed)  Labs Reviewed  LIPASE, BLOOD - Abnormal; Notable for the following components:      Result Value   Lipase 145 (*)    All other components within normal limits  COMPREHENSIVE METABOLIC PANEL - Abnormal; Notable for the following components:  CO2 20 (*)    Glucose, Bld 108 (*)    Albumin 3.4 (*)    All other components within normal limits  SARS CORONAVIRUS 2 (TAT 6-24 HRS)  CBC  MAGNESIUM  URINALYSIS, COMPLETE (UACMP) WITH MICROSCOPIC  HIV ANTIBODY (ROUTINE TESTING W REFLEX)  COMPREHENSIVE METABOLIC PANEL  CBC  LIPASE, BLOOD  POC URINE PREG, ED   ____________________________________________  EKG  ED ECG REPORT I, Blake Divine, the attending physician, personally viewed and interpreted this ECG.   Date: 12/09/2019  EKG Time: 15:23  Rate: 67  Rhythm: normal sinus rhythm  Axis: LAD  Intervals:none  ST&T Change: Nonspecific T wave changes, LVH   PROCEDURES  Procedure(s) performed (including Critical Care):  Procedures   ____________________________________________   INITIAL IMPRESSION / ASSESSMENT AND PLAN / ED COURSE       39 year old female with history of hypertension, asthma, and chronic pain presents to the ED complaining of constant epigastric pain over the past couple of days associated with difficulty tolerating p.o.  She had similar symptoms during recent admission at [facility]  and found to have peripancreatic inflammation on CT scan 1 week prior.  Her lipase was mildly elevated at that time but it has now continued to rise with increasing pain.  We will treat with IV morphine and Zofran, hydrate with IV fluids.  Lab work is otherwise reassuring, will add on magnesium.  We will reassess for symptomatic improvement.  Patient continues to have significant epigastric pain, magnesium found to be within normal limits.  Given rising lipase and pancreatitis seen on recent CT scan as well as significant pain, case was discussed with hospitalist for admission.      ____________________________________________   FINAL CLINICAL IMPRESSION(S) / ED DIAGNOSES  Final diagnoses:  Acute pancreatitis without infection or necrosis, unspecified pancreatitis type     ED Discharge Orders    None       Note:  This document was prepared using Dragon voice recognition software and may include unintentional dictation errors.   Blake Divine, MD 12/09/19 2242

## 2019-12-09 NOTE — ED Notes (Signed)
EDT attempted blood. Lab called for draw at this time

## 2019-12-09 NOTE — ED Notes (Signed)
Attempted blood draw x 2  

## 2019-12-09 NOTE — H&P (Addendum)
[place] at [place] NAME: Sarah Sosa    MR#:  [id number]  DATE OF BIRTH:  [birth year]  DATE OF ADMISSION:  12/09/2019  PRIMARY CARE PHYSICIAN: System, Pcp Not In   REQUESTING/REFERRING PHYSICIAN: Glenice Bow, MD  CHIEF COMPLAINT:   Chief Complaint  Patient presents with  . Abdominal Pain    HISTORY OF PRESENT ILLNESS:  Sarah Sosa  is a 39 y.o. African-American female with a known history of asthma, GERD, hypertension and pancreatitis, who presented to the emergency room with acute onset of upper quadrant abdominal pain with associated nausea and vomiting over the last 3 days.  She admits to diarrhea with loose and occasional watery bowel movements for the last couple of days.  No fever or chills.  No dyspnea or cough or wheezing.  No chest pain or palpitations.  No dysuria, oliguria or hematuria or flank pain.  She was diagnosed with acute pancreatitis and was admitted in [facility] for a day and a half about a week ago.  She apparently felt better and states that she was started on Zestoretic for elevated blood pressure and after she went home for a few days started having abdominal pain again.  Upon presentation to the emergency room, blood pressure was 221/114 and vital signs otherwise were within normal.  Labs revealed potassium of 3.5 and magnesium 1.8 with a lipase of 145 and albumin 3.4.  CBC was unremarkable.  EKG showed normal sinus rhythm with a rate of 67 with sinus arrhythmia, possible left atrial enlargement, LVH and nonspecific T wave abnormalities.  The patient was given 1 L bolus of IV normal saline, 4 mg of IV morphine sulfate and 4 mg of IV Zofran.  She will be admitted to a medical bed for further evaluation and management. PAST MEDICAL HISTORY:   Past Medical History:  Diagnosis Date  . Arthritis   . Asthma   . Chronic pain disorder 03/19/2018  . GERD (gastroesophageal reflux disease)   . Headache   . Heart murmur   .  History of trichomoniasis 03/19/2018  . Hypertension   . Pancreatitis   . PID (pelvic inflammatory disease) 02/14/2018  . Uterine leiomyoma 03/19/2018    PAST SURGICAL HISTORY:   Past Surgical History:  Procedure Laterality Date  . ABDOMINAL HYSTERECTOMY    . CHOLECYSTECTOMY    . DENTAL SURGERY    . HYSTERECTOMY ABDOMINAL WITH SALPINGECTOMY Bilateral 04/15/2018   Procedure: HYSTERECTOMY ABDOMINAL WITH BILATERAL SALPINGECTOMY;  Surgeon: Rubie Maid, MD;  Location: [facility] ORS;  Service: Gynecology;  Laterality: Bilateral;  . kenn surgery Bilateral   . KNEE ARTHROSCOPY Left 2012, 2013    SOCIAL HISTORY:   Social History   Tobacco Use  . Smoking status: Current Some Day Smoker    Packs/day: 0.50    Years: 22.00    Pack years: 11.00    Types: Cigarettes  . Smokeless tobacco: Never Used  Substance Use Topics  . Alcohol use: Yes    Comment: occasional    FAMILY HISTORY:   Family History  Problem Relation Age of Onset  . Fibroids Mother   . Hypertension Mother   . Arthritis Mother     DRUG ALLERGIES:   Allergies  Allergen Reactions  . Latex Anaphylaxis, Hives and Itching  . Nsaids Other (See Comments)    Serve ulcers; stomach aches, GERD.   . Tomato Anaphylaxis, Hives and Itching  . Tramadol Itching    REVIEW OF SYSTEMS:  ROS As per history of present illness. All pertinent systems were reviewed above. Constitutional,  HEENT, cardiovascular, respiratory, GI, GU, musculoskeletal, neuro, psychiatric, endocrine,  integumentary and hematologic systems were reviewed and are otherwise  negative/unremarkable except for positive findings mentioned above in the HPI.   MEDICATIONS AT HOME:   Prior to Admission medications   Medication Sig Start Date End Date Taking? Authorizing Provider  ALPRAZolam (XANAX) 0.25 MG tablet Take 0.25 mg by mouth 2 (two) times daily as needed.     [provider]  dicyclomine (BENTYL) 20 MG tablet Take 1 tablet (20 mg total) by  mouth 3 (three) times daily as needed for spasms. 03/25/19 03/24/20  Duffy Bruce, MD  dicyclomine (BENTYL) 20 MG tablet Take 1 tablet (20 mg total) by mouth 3 (three) times daily as needed for spasms. 06/14/19 07/14/19  Gregor Hams, MD  diphenhydramine-acetaminophen (TYLENOL PM) 25-500 MG TABS tablet Take 2 tablets by mouth at bedtime as needed (sleep).    [provider]  lisinopril-hydrochlorothiazide (ZESTORETIC) 10-12.5 MG tablet Take 1 tablet by mouth daily. Patient taking differently: Take 2 tablets by mouth at bedtime.  04/23/18   Rubie Maid, MD  metoCLOPramide (REGLAN) 10 MG tablet Take 1 tablet (10 mg total) by mouth every 8 (eight) hours as needed for nausea or vomiting (can also be taken for headache). 03/27/19   Earleen Newport, MD  ondansetron (ZOFRAN ODT) 4 MG disintegrating tablet Take 1 tablet (4 mg total) by mouth every 8 (eight) hours as needed. 08/04/19   Rudene Re, MD  oxyCODONE-acetaminophen (PERCOCET) 7.5-325 MG tablet Take 1 tablet by mouth 4 (four) times daily.     [provider]  polyethylene glycol (MIRALAX) packet Take 17 g by mouth daily. Take daily for 2 weeks, then daily prn. Patient taking differently: Take 17 g by mouth daily as needed. Take daily for 2 weeks, then daily prn 04/23/18   Rubie Maid, MD  predniSONE (DELTASONE) 10 MG tablet Take 6 tablets  today, on day 2 take 5 tablets, day 3 take 4 tablets, day 4 take 3 tablets, day 5 take  2 tablets and 1 tablet the last day 05/11/19   Johnn Hai, PA-C      VITAL SIGNS:  Blood pressure (!) 149/89, pulse (!) 58, temperature 98.2 F (36.8 C), resp. rate 20, height 5\' 7"  (1.702 m), weight 94.3 kg, last menstrual period 03/25/2018, SpO2 98 %.  PHYSICAL EXAMINATION:  Physical Exam  GENERAL:  39 y.o.-year-old African-American female patient lying in the bed with no acute distress.  EYES: Pupils equal, round, reactive to light and accommodation. No scleral icterus.  Extraocular muscles intact.  HEENT: Head atraumatic, normocephalic. Oropharynx and nasopharynx clear.  NECK:  Supple, no jugular venous distention. No thyroid enlargement, no tenderness.  LUNGS: Normal breath sounds bilaterally, no wheezing, rales,rhonchi or crepitation. No use of accessory muscles of respiration.  CARDIOVASCULAR: Regular rate and rhythm, S1, S2 normal. No murmurs, rubs, or gallops.  ABDOMEN: Soft with epigastric and left upper quadrant abdominal tenderness without rebound tenderness guarding rigidity.  Bowel sounds present. No organomegaly or mass.  EXTREMITIES: No pedal edema, cyanosis, or clubbing.  NEUROLOGIC: Cranial nerves II through XII are intact. Muscle strength 5/5 in all extremities. Sensation intact. Gait not checked.  PSYCHIATRIC: The patient is alert and oriented x 3.  Normal affect and good eye contact. SKIN: No obvious rash, lesion, or ulcer.   LABORATORY PANEL:   CBC Recent Labs  Lab 12/09/19 1529  WBC 4.3  HGB 13.0  HCT 39.8  PLT 223   ------------------------------------------------------------------------------------------------------------------  Chemistries  Recent Labs  Lab 12/09/19 1529 12/09/19 2039  NA 139  --   K 3.5  --   CL 110  --   CO2 20*  --   GLUCOSE 108*  --   BUN 8  --   CREATININE 0.73  --   CALCIUM 9.1  --   MG  --  1.8  AST 18  --   ALT 11  --   ALKPHOS 44  --   BILITOT 0.6  --    ------------------------------------------------------------------------------------------------------------------  Cardiac Enzymes No results for input(s): TROPONINI in the last 168 hours. ------------------------------------------------------------------------------------------------------------------  RADIOLOGY:  No results found.    IMPRESSION AND PLAN:   1.  Acute pancreatitis. -The patient will be admitted to medical bed. -She will be kept NPO. -She will be hydrated with IV normal saline. -We will follow daily lipase  levels. -Pain management will be provided with as needed IV Dilaudid and po percocet. -HCTZ will be discontinued as this could be the culprit for her pancreatitis.  She was advised not to take it again.  2.  Hypertensive urgency -Continue Zestril and stop HCTZ. -The patient will be placed on as needed IV labetalol  3.  Tobacco abuse. -He was counseled for smoking cessation will receive further counseling here.  4.  Diarrhea. -Stool studies will be obtained and the patient will be hydrated with IV normal saline.  5.  DVT prophylaxis. -Subcutaneous Lovenox.  All the records are reviewed and case discussed with ED provider. The plan of care was discussed in details with the patient.  I answered all questions. The patient agreed to proceed with the above mentioned plan. Further management will depend upon hospital course.   CODE STATUS: Full code  Status is: Inpatient  Remains inpatient appropriate because:Ongoing active pain requiring inpatient pain management, Ongoing diagnostic testing needed not appropriate for outpatient work up, Unsafe d/c plan, IV treatments appropriate due to intensity of illness or inability to take PO and Inpatient level of care appropriate due to severity of illness   Dispo: The patient is from: Home              Anticipated d/c is to: Home              Anticipated d/c date is: 2 days              Patient currently is not medically stable to d/c.   TOTAL TIME TAKING CARE OF THIS PATIENT: 55 minutes.    Christel Mormon M.D on 12/09/2019 at 10:26 PM  [facility]   From 7 PM-7 AM, contact night-coverage [email or web address]  CC: Primary care physician; System, Pcp Not In   Note: This dictation was prepared with Dragon dictation along with smaller phrase technology. Any transcriptional errors that result from this process are unintentional.

## 2019-12-10 DIAGNOSIS — K853 Drug induced acute pancreatitis without necrosis or infection: Secondary | ICD-10-CM

## 2019-12-10 LAB — COMPREHENSIVE METABOLIC PANEL
ALT: 10 U/L (ref 0–44)
AST: 15 U/L (ref 15–41)
Albumin: 3 g/dL — ABNORMAL LOW (ref 3.5–5.0)
Alkaline Phosphatase: 36 U/L — ABNORMAL LOW (ref 38–126)
Anion gap: 4 — ABNORMAL LOW (ref 5–15)
BUN: 11 mg/dL (ref 6–20)
CO2: 25 mmol/L (ref 22–32)
Calcium: 8.4 mg/dL — ABNORMAL LOW (ref 8.9–10.3)
Chloride: 108 mmol/L (ref 98–111)
Creatinine, Ser: 0.77 mg/dL (ref 0.44–1.00)
GFR calc Af Amer: >60 mL/min (ref >60)
GFR calc non Af Amer: >60 mL/min (ref >60)
Glucose, Bld: 99 mg/dL (ref 70–99)
Potassium: 3.5 mmol/L (ref 3.5–5.1)
Sodium: 137 mmol/L (ref 135–145)
Total Bilirubin: 0.6 mg/dL (ref 0.3–1.2)
Total Protein: 6.2 g/dL — ABNORMAL LOW (ref 6.5–8.1)

## 2019-12-10 LAB — CBC
HCT: 34.9 % — ABNORMAL LOW (ref 36.0–46.0)
Hemoglobin: 11.8 g/dL — ABNORMAL LOW (ref 12.0–15.0)
MCH: 32.9 pg (ref 26.0–34.0)
MCHC: 33.8 g/dL (ref 30.0–36.0)
MCV: 97.2 fL (ref 80.0–100.0)
Platelets: 230 10*3/uL (ref 150–400)
RBC: 3.59 MIL/uL — ABNORMAL LOW (ref 3.87–5.11)
RDW: 13.2 % (ref 11.5–15.5)
WBC: 6 10*3/uL (ref 4.0–10.5)
nRBC: 0 % (ref 0.0–0.2)

## 2019-12-10 LAB — URINALYSIS, COMPLETE (UACMP) WITH MICROSCOPIC
Bacteria, UA: NONE SEEN
Bilirubin Urine: NEGATIVE
Glucose, UA: NEGATIVE mg/dL
Hgb urine dipstick: NEGATIVE
Ketones, ur: NEGATIVE mg/dL
Nitrite: NEGATIVE
Protein, ur: NEGATIVE mg/dL
Specific Gravity, Urine: 1.017 (ref 1.005–1.030)
pH: 5 (ref 5.0–8.0)

## 2019-12-10 LAB — POCT PREGNANCY, URINE: Preg Test, Ur: NEGATIVE

## 2019-12-10 LAB — SARS CORONAVIRUS 2 (TAT 6-24 HRS): SARS Coronavirus 2: NEGATIVE

## 2019-12-10 LAB — HIV ANTIBODY (ROUTINE TESTING W REFLEX): HIV Screen 4th Generation wRfx: NONREACTIVE

## 2019-12-10 LAB — LIPASE, BLOOD: Lipase: 168 U/L — ABNORMAL HIGH (ref 11–51)

## 2019-12-10 MED ORDER — LISINOPRIL 10 MG PO TABS
20.0000 mg | ORAL_TABLET | ORAL | Status: AC
  Administered 2019-12-10: 20 mg via ORAL
  Filled 2019-12-10: qty 2

## 2019-12-10 MED ORDER — HYDROMORPHONE HCL 1 MG/ML IJ SOLN
1.0000 mg | INTRAMUSCULAR | Status: DC | PRN
  Administered 2019-12-10 (×4): 1 mg via INTRAVENOUS
  Filled 2019-12-10 (×4): qty 1

## 2019-12-10 MED ORDER — HYDROMORPHONE HCL 1 MG/ML IJ SOLN
1.0000 mg | INTRAMUSCULAR | Status: DC | PRN
  Administered 2019-12-11 (×2): 2 mg via INTRAVENOUS
  Filled 2019-12-10 (×2): qty 2

## 2019-12-10 MED ORDER — DIPHENHYDRAMINE HCL 25 MG PO CAPS
25.0000 mg | ORAL_CAPSULE | Freq: Every evening | ORAL | Status: DC | PRN
  Administered 2019-12-10: 25 mg via ORAL
  Filled 2019-12-10: qty 1

## 2019-12-10 MED ORDER — ONDANSETRON HCL 4 MG/2ML IJ SOLN
INTRAMUSCULAR | Status: AC
  Administered 2019-12-10: 4 mg via INTRAVENOUS
  Filled 2019-12-10: qty 2

## 2019-12-10 MED ORDER — HYDROMORPHONE HCL 1 MG/ML IJ SOLN
1.0000 mg | Freq: Once | INTRAMUSCULAR | Status: AC
  Administered 2019-12-10: 1 mg via INTRAVENOUS
  Filled 2019-12-10: qty 1

## 2019-12-10 MED ORDER — ACETAMINOPHEN 500 MG PO TABS
500.0000 mg | ORAL_TABLET | Freq: Every evening | ORAL | Status: DC | PRN
  Filled 2019-12-10: qty 1

## 2019-12-10 NOTE — ED Notes (Signed)
PT switched to hospital bed

## 2019-12-10 NOTE — ED Notes (Signed)
Dr Kurtis Bushman at bedside

## 2019-12-10 NOTE — Progress Notes (Signed)
PROGRESS NOTE    Sarah Sosa  [id number] DOB: 12-Oct-1980 DOA: 12/09/2019 PCP: System, Pcp Not In    Brief Narrative:  Sarah Sosa  is a 39 y.o. African-American female with a known history of asthma, GERD, hypertension and pancreatitis, who presented to the emergency room with acute onset of upper quadrant abdominal pain with associated nausea and vomiting over the last 3 days.  She admits to diarrhea with loose and occasional watery bowel movements for the last couple of days.  No fever or chills.  No dyspnea or cough or wheezing.  No chest pain or palpitations.  No dysuria, oliguria or hematuria or flank pain.  She was diagnosed with acute pancreatitis and was admitted in [facility] for a day and a half about a week ago.  She apparently felt better and states that she was started on Zestoretic for elevated blood pressure and after she went home for a few days started having abdominal pain again.  Upon presentation to the emergency room, blood pressure was 221/114 and vital signs otherwise were within normal.  Labs revealed potassium of 3.5 and magnesium 1.8 with a lipase of 145 and albumin 3.4.  CBC was unremarkable.  EKG showed normal sinus rhythm with a rate of 67 with sinus arrhythmia, possible left atrial enlargement, LVH and nonspecific T wave abnormalities.    Consultants:     Procedures:   Antimicrobials:      Subjective: Complaining of epigastric pain, wants the amount of pain meds she was given at "[facility]". No n/v  Objective: Vitals:   12/10/19 0800 12/10/19 1000 12/10/19 1030 12/10/19 1130  BP: 115/77 118/71 111/68 103/72  Pulse: (!) 57 (!) 53 62 (!) 52  Resp: 17 18 17 16   Temp:      SpO2: 94% 93% 97% 95%  Weight:      Height:        Intake/Output Summary (Last 24 hours) at 12/10/2019 1558 Last data filed at 12/09/2019 2315 Gross per 24 hour  Intake 1000 ml  Output --  Net 1000 ml   Filed Weights   12/09/19 1513  Weight: 94.3 kg     Examination:  General exam: Appears calm and comfortable  Respiratory system: Clear to auscultation. Respiratory effort normal. Cardiovascular system: S1 & S2 heard, RRR. No JVD, murmurs, rubs, gallops or clicks. No pedal edema. Gastrointestinal system: Abdomen is nondistended, soft and minimal tender. Normal bowel sounds heard. Central nervous system: Alert and oriented. Grossly intact Extremities: no edema Skin: warm and dry Psychiatry: Judgement and insight appear normal. Mood & affect appropriate.     Data Reviewed: I have personally reviewed following labs and imaging studies  CBC: Recent Labs  Lab 12/09/19 1529 12/10/19 0512  WBC 4.3 6.0  HGB 13.0 11.8*  HCT 39.8 34.9*  MCV 98.0 97.2  PLT 223 [id number]   Basic Metabolic Panel: Recent Labs  Lab 12/09/19 1529 12/09/19 2039 12/10/19 0512  NA 139  --  137  K 3.5  --  3.5  CL 110  --  108  CO2 20*  --  25  GLUCOSE 108*  --  99  BUN 8  --  11  CREATININE 0.73  --  0.77  CALCIUM 9.1  --  8.4*  MG  --  1.8  --    GFR: Estimated Creatinine Clearance: 112.4 mL/min (by C-G formula based on SCr of 0.77 mg/dL). Liver Function Tests: Recent Labs  Lab 12/09/19 1529 12/10/19 0512  AST 18 15  ALT 11 10  ALKPHOS 44 36*  BILITOT 0.6 0.6  PROT 7.0 6.2*  ALBUMIN 3.4* 3.0*   Recent Labs  Lab 12/09/19 1529 12/10/19 0512  LIPASE 145* 168*   No results for input(s): AMMONIA in the last 168 hours. Coagulation Profile: No results for input(s): INR, PROTIME in the last 168 hours. Cardiac Enzymes: No results for input(s): CKTOTAL, CKMB, CKMBINDEX, TROPONINI in the last 168 hours. BNP (last 3 results) No results for input(s): PROBNP in the last 8760 hours. HbA1C: No results for input(s): HGBA1C in the last 72 hours. CBG: No results for input(s): GLUCAP in the last 168 hours. Lipid Profile: No results for input(s): CHOL, HDL, LDLCALC, TRIG, CHOLHDL, LDLDIRECT in the last 72 hours. Thyroid Function Tests: No results  for input(s): TSH, T4TOTAL, FREET4, T3FREE, THYROIDAB in the last 72 hours. Anemia Panel: No results for input(s): VITAMINB12, FOLATE, FERRITIN, TIBC, IRON, RETICCTPCT in the last 72 hours. Sepsis Labs: No results for input(s): PROCALCITON, LATICACIDVEN in the last 168 hours.  Recent Results (from the past 240 hour(s))  SARS CORONAVIRUS 2 (TAT 6-24 HRS) Nasopharyngeal Nasopharyngeal Swab     Status: None   Collection Time: 12/10/19  3:41 AM   Specimen: Nasopharyngeal Swab  Result Value Ref Range Status   SARS Coronavirus 2 NEGATIVE NEGATIVE Final    Comment: (NOTE) SARS-CoV-2 target nucleic acids are NOT DETECTED. The SARS-CoV-2 RNA is generally detectable in upper and lower respiratory specimens during the acute phase of infection. Negative results do not preclude SARS-CoV-2 infection, do not rule out co-infections with other pathogens, and should not be used as the sole basis for treatment or other patient management decisions. Negative results must be combined with clinical observations, patient history, and epidemiological information. The expected result is Negative. Fact Sheet for Patients: SugarRoll.be Fact Sheet for Healthcare Providers: https://www.woods-mathews.com/ This test is not yet approved or cleared by the [place] FDA and  has been authorized for detection and/or diagnosis of SARS-CoV-2 by FDA under an Emergency Use Authorization (EUA). This EUA will remain  in effect (meaning this test can be used) for the duration of the COVID-19 declaration under Section 56 4(b)(1) of the Act, 21 U.S.C. section 360bbb-3(b)(1), unless the authorization is terminated or revoked sooner. Performed at [facility], [place] [address], [place], [place] [address]          Radiology Studies: No results found.      Scheduled Meds: . enoxaparin (LOVENOX) injection  40 mg Subcutaneous Q24H  . lisinopril  20 mg Oral Daily  .  oxyCODONE-acetaminophen  1 tablet Oral QID  . sodium chloride flush  3 mL Intravenous Once   Continuous Infusions: . sodium chloride 150 mL/hr at 12/10/19 1147    Assessment & Plan:   Active Problems:   Acute pancreatitis   1.  Acute pancreatitis. -The patient will be admitted to medical bed. -keept NPO.as lipase increased -continue hydrated with IV normal saline.  Increase rate to 150 MLS per hour -Check a.m. lipase -Pain management will be provided with as needed IV Dilaudid and po percocet. -HCTZ will be discontinued as this could be the culprit for her pancreatitis.  She was advised not to take it again.  2.  Hypertensive urgency -Continue Zestril and stop HCTZ. Better controlled -The patient will be placed on as needed IV labetalol  3.  Tobacco abuse. -was counseled for smoking cessation will receive further counseling here.  4.  Diarrhea. -none reported to me today.  Stool  studies pending  DVT prophylaxis: lovenox Code Status:full Family Communication: None  Disposition Plan: Back to home life Barrier: Still with lots of pain, once pain better controlled will DC possibly in 1 to 2 days       LOS: 1 day   Time spent: 45 min with >50% on coc    Nolberto Hanlon, MD [facility] Pager [phone]  If 7PM-7AM, please contact night-coverage [email or web address] Password [facility] 12/10/2019, 3:58 PM

## 2019-12-10 NOTE — ED Notes (Signed)
Pt ambulatory without assistance to bathroom at this time.

## 2019-12-10 NOTE — ED Notes (Signed)
Messaged dr Gwynneth Albright to inform pts pain is not controlled.

## 2019-12-10 NOTE — ED Notes (Signed)
PT requesting lisinopril stating she missed her dose yesterday. Contacted pharmacy to send since pyxis won't let me pull.

## 2019-12-10 NOTE — ED Notes (Signed)
Went to give lisinopril to pt, pt asleep and BP 130/47. Will admin when pt awake in order to keep pain under control and not wake pt.

## 2019-12-10 NOTE — Plan of Care (Signed)
Patient is alert and oriented with bilateral knee pain and abdominal pain. Her pain is a 10 out of 10.  Will give meds ordered.  Sarah Sosa

## 2019-12-10 NOTE — ED Notes (Signed)
Upon assuming care of pt, entered room and pt was eating [employer]. Instructed pt she was not to eat or drink besides sips with meds.  PT voices understanding.

## 2019-12-10 NOTE — ED Notes (Signed)
Pt ambulatory to bathroom with steady gait. No assistance required.

## 2019-12-11 DIAGNOSIS — I10 Essential (primary) hypertension: Secondary | ICD-10-CM

## 2019-12-11 DIAGNOSIS — F17200 Nicotine dependence, unspecified, uncomplicated: Secondary | ICD-10-CM

## 2019-12-11 LAB — LIPASE, BLOOD: Lipase: 136 U/L — ABNORMAL HIGH (ref 11–51)

## 2019-12-11 MED ORDER — ACETAMINOPHEN 325 MG PO TABS: 650.0000 mg | ORAL_TABLET | Freq: Four times a day (QID) | ORAL | Status: DC | PRN

## 2019-12-11 MED ORDER — HYDROMORPHONE HCL 1 MG/ML IJ SOLN: 0.5000 mg | INTRAMUSCULAR | Status: DC | PRN

## 2019-12-11 MED ORDER — LISINOPRIL 20 MG PO TABS: 20.0000 mg | ORAL_TABLET | Freq: Every day | ORAL | 0 refills | Status: DC

## 2019-12-11 MED ORDER — AMLODIPINE BESYLATE 5 MG PO TABS
5.0000 mg | ORAL_TABLET | Freq: Every day | ORAL | 0 refills | Status: DC
Start: 2019-12-11 — End: 2020-04-05

## 2019-12-11 NOTE — Progress Notes (Signed)
Discharge instructions given. Patient verbalized understanding and all questions were answered. Per patient son will be here shortly to pick her up.

## 2019-12-11 NOTE — Discharge Summary (Signed)
Sarah Sosa [id number] DOB: 1980/10/17 DOA: 12/09/2019  PCP: System, Pcp Not In  Admit date: 12/09/2019 Discharge date: 12/11/2019  Admitted From: home Disposition:  home  Recommendations for Outpatient Follow-up:  1. Follow up with PCP in 1 week 2. Please obtain BMP/CBC in one week   Discharge Condition:Stable CODE STATUS:full Diet recommendation: low fat, low cholesterol Brief/Interim Summary: Sarah Sosa  is a 39 y.o. African-American female with a known history of asthma, GERD, hypertension , drug abuse and pancreatitis, who presented to the emergency room with acute onset of upper quadrant abdominal pain with associated nausea and vomiting over the last 3 days.  She admited to diarrhea with loose and occasional watery bowel movements for couple of days.  No fever or chills.   She was diagnosed with acute pancreatitis and was admitted in [facility] for a day and a half about a week ago.  She apparently felt better and states that she was started on Zestoretic for elevated blood pressure and after she went home for a few days started having abdominal pain again.   1. Acute pancreatitis. Likely  Due to HCTZ.  Pt lipase was elevated now has decreased.  She is well hydrated with aggressive IV hydration. Today she tolerated her clears and asked for solid food.  I started her on solid food.  I did tell her that her family cannot bring outside food since we like her to eat a low-fat low-cholesterol diet especially with her pancreatitis while in the hospital.  Nursing told me that her son brought her [employer], though she ate [place] and half of her food that we had ordered for her here.  Which she tolerated.   2. Hypertensive urgency -Continue Zestril andstopHCTZ. Will add amlodipine F/u with pcp as outpt for further mx.  3. Tobacco abuse. -was counseled for smoking cessation will receive further counseling here.  4.Diarrhea. None since admission   Discharge  Diagnoses:  Active Problems:   Acute pancreatitis    Discharge Instructions  Discharge Instructions    Call MD for:  temperature >100.4   Complete by: As directed    Diet - low sodium heart healthy   Complete by: As directed    Discharge instructions   Complete by: As directed    No fatty good, fried food. Eat low fat food. Do not take hydrochloro thiazie med. F/u with pcp once you get established in one week   Increase activity slowly   Complete by: As directed      Allergies as of 12/11/2019      Reactions   Latex Anaphylaxis, Hives, Itching   Nsaids Other (See Comments)   Serve ulcers; stomach aches, GERD.    Tomato Anaphylaxis, Hives, Itching   Tramadol Itching      Medication List    STOP taking these medications   ciprofloxacin 250 MG tablet Commonly known as: CIPRO   lisinopril-hydrochlorothiazide 10-12.5 MG tablet Commonly known as: Zestoretic   metroNIDAZOLE 500 MG tablet Commonly known as: FLAGYL     TAKE these medications   acetaminophen 325 MG tablet Commonly known as: TYLENOL Take 2 tablets (650 mg total) by mouth every 6 (six) hours as needed for mild pain (or Fever >/= 101).   ALPRAZolam 0.25 MG tablet Commonly known as: XANAX Take 0.25 mg by mouth 2 (two) times daily as needed.   amLODipine 5 MG tablet Commonly known as: NORVASC Take 1 tablet (5 mg total) by mouth daily.   diclofenac Sodium 1 %  Gel Commonly known as: VOLTAREN SMARTSIG:5 Gram(s) Topical 4 Times Daily   diphenhydramine-acetaminophen 25-500 MG Tabs tablet Commonly known as: TYLENOL PM Take 2 tablets by mouth at bedtime as needed (sleep).   lisinopril 20 MG tablet Commonly known as: ZESTRIL Take 1 tablet (20 mg total) by mouth daily. Start taking on: Dec 12, 2019   oxyCODONE-acetaminophen 7.5-325 MG tablet Commonly known as: PERCOCET Take 1 tablet by mouth 4 (four) times daily.       Allergies  Allergen Reactions  . Latex Anaphylaxis, Hives and Itching  . Nsaids  Other (See Comments)    Serve ulcers; stomach aches, GERD.   . Tomato Anaphylaxis, Hives and Itching  . Tramadol Itching    Consultations: none  Procedures/Studies: No results found.    Subjective: Pt feels better was asking for solid food today as tolerated clears. Wanted her kids bring her food from outside, although she was instructed to eat the food ordered here and none from outside her son still brought her outside food which she ate.  Discharge Exam: Vitals:   12/11/19 0608 12/11/19 1345  BP: (!) 142/77 (!) 152/81  Pulse: (!) 52 71  Resp: 20   Temp: 98.1 F (36.7 C) 98.6 F (37 C)  SpO2: 100% 100%   Vitals:   12/10/19 1950 12/10/19 2102 12/11/19 0608 12/11/19 1345  BP: 136/72 131/74 (!) 142/77 (!) 152/81  Pulse: 62 (!) 52 (!) 52 71  Resp: 18 16 20    Temp:  97.7 F (36.5 C) 98.1 F (36.7 C) 98.6 F (37 C)  TempSrc:  Oral Oral Oral  SpO2: 100% 100% 100% 100%  Weight:  93.3 kg    Height:  5\' 6"  (1.676 m)      General: Pt is alert, awake, not in acute distress Cardiovascular: RRR, S1/S2 +, no rubs, no gallops Respiratory: CTA bilaterally, no wheezing, no rhonchi Abdominal: Soft, NT while distracting the patient on exam, ND, bowel sounds + Extremities: no edema, no cyanosis    The results of significant diagnostics from this hospitalization (including imaging, microbiology, ancillary and laboratory) are listed below for reference.     Microbiology: Recent Results (from the past 240 hour(s))  SARS CORONAVIRUS 2 (TAT 6-24 HRS) Nasopharyngeal Nasopharyngeal Swab     Status: None   Collection Time: 12/10/19  3:41 AM   Specimen: Nasopharyngeal Swab  Result Value Ref Range Status   SARS Coronavirus 2 NEGATIVE NEGATIVE Final    Comment: (NOTE) SARS-CoV-2 target nucleic acids are NOT DETECTED. The SARS-CoV-2 RNA is generally detectable in upper and lower respiratory specimens during the acute phase of infection. Negative results do not preclude SARS-CoV-2  infection, do not rule out co-infections with other pathogens, and should not be used as the sole basis for treatment or other patient management decisions. Negative results must be combined with clinical observations, patient history, and epidemiological information. The expected result is Negative. Fact Sheet for Patients: SugarRoll.be Fact Sheet for Healthcare Providers: https://www.woods-mathews.com/ This test is not yet approved or cleared by the [place] FDA and  has been authorized for detection and/or diagnosis of SARS-CoV-2 by FDA under an Emergency Use Authorization (EUA). This EUA will remain  in effect (meaning this test can be used) for the duration of the COVID-19 declaration under Section 56 4(b)(1) of the Act, 21 U.S.C. section 360bbb-3(b)(1), unless the authorization is terminated or revoked sooner. Performed at [facility], [place] [address] [place], [place] [address]      Labs: BNP (  last 3 results) No results for input(s): BNP in the last 8760 hours. Basic Metabolic Panel: Recent Labs  Lab 12/09/19 1529 12/09/19 2039 12/10/19 0512  NA 139  --  137  K 3.5  --  3.5  CL 110  --  108  CO2 20*  --  25  GLUCOSE 108*  --  99  BUN 8  --  11  CREATININE 0.73  --  0.77  CALCIUM 9.1  --  8.4*  MG  --  1.8  --    Liver Function Tests: Recent Labs  Lab 12/09/19 1529 12/10/19 0512  AST 18 15  ALT 11 10  ALKPHOS 44 36*  BILITOT 0.6 0.6  PROT 7.0 6.2*  ALBUMIN 3.4* 3.0*   Recent Labs  Lab 12/09/19 1529 12/10/19 0512 12/11/19 0414  LIPASE 145* 168* 136*   No results for input(s): AMMONIA in the last 168 hours. CBC: Recent Labs  Lab 12/09/19 1529 12/10/19 0512  WBC 4.3 6.0  HGB 13.0 11.8*  HCT 39.8 34.9*  MCV 98.0 97.2  PLT 223 230   Cardiac Enzymes: No results for input(s): CKTOTAL, CKMB, CKMBINDEX, TROPONINI in the last 168 hours. BNP: Invalid input(s): POCBNP CBG: No results for  input(s): GLUCAP in the last 168 hours. D-Dimer No results for input(s): DDIMER in the last 72 hours. Hgb A1c No results for input(s): HGBA1C in the last 72 hours. Lipid Profile No results for input(s): CHOL, HDL, LDLCALC, TRIG, CHOLHDL, LDLDIRECT in the last 72 hours. Thyroid function studies No results for input(s): TSH, T4TOTAL, T3FREE, THYROIDAB in the last 72 hours.  Invalid input(s): FREET3 Anemia work up No results for input(s): VITAMINB12, FOLATE, FERRITIN, TIBC, IRON, RETICCTPCT in the last 72 hours. Urinalysis    Component Value Date/Time   COLORURINE YELLOW (A) 12/09/2019 1800   APPEARANCEUR CLEAR (A) 12/09/2019 1800   LABSPEC 1.017 12/09/2019 1800   PHURINE 5.0 12/09/2019 1800   GLUCOSEU NEGATIVE 12/09/2019 1800   HGBUR NEGATIVE 12/09/2019 1800   BILIRUBINUR NEGATIVE 12/09/2019 1800   KETONESUR NEGATIVE 12/09/2019 1800   PROTEINUR NEGATIVE 12/09/2019 1800   NITRITE NEGATIVE 12/09/2019 1800   LEUKOCYTESUR TRACE (A) 12/09/2019 1800   Sepsis Labs Invalid input(s): PROCALCITONIN,  WBC,  LACTICIDVEN Microbiology Recent Results (from the past 240 hour(s))  SARS CORONAVIRUS 2 (TAT 6-24 HRS) Nasopharyngeal Nasopharyngeal Swab     Status: None   Collection Time: 12/10/19  3:41 AM   Specimen: Nasopharyngeal Swab  Result Value Ref Range Status   SARS Coronavirus 2 NEGATIVE NEGATIVE Final    Comment: (NOTE) SARS-CoV-2 target nucleic acids are NOT DETECTED. The SARS-CoV-2 RNA is generally detectable in upper and lower respiratory specimens during the acute phase of infection. Negative results do not preclude SARS-CoV-2 infection, do not rule out co-infections with other pathogens, and should not be used as the sole basis for treatment or other patient management decisions. Negative results must be combined with clinical observations, patient history, and epidemiological information. The expected result is Negative. Fact Sheet for  Patients: SugarRoll.be Fact Sheet for Healthcare Providers: https://www.woods-mathews.com/ This test is not yet approved or cleared by the [place] FDA and  has been authorized for detection and/or diagnosis of SARS-CoV-2 by FDA under an Emergency Use Authorization (EUA). This EUA will remain  in effect (meaning this test can be used) for the duration of the COVID-19 declaration under Section 56 4(b)(1) of the Act, 21 U.S.C. section 360bbb-3(b)(1), unless the authorization is terminated or revoked sooner. Performed at [facility], [place] [address], [place], [place] [address]      Time coordinating discharge: Over 30 minutes  SIGNED:   Nolberto Hanlon, MD  [facility] 12/11/2019, 3:29 PM Pager   If 7PM-7AM, please contact night-coverage [email or web address] Password [email or web address]

## 2019-12-11 NOTE — Progress Notes (Signed)
Reached out to Dr. Sidney Ace regarding patient complaints of intense pain. Orders were changed to increase dosage of dilaudid for every 4 hours.  Will continue to monitor.  Phoebe Sharps N  12/11/2019  1:00 AM

## 2020-01-10 ENCOUNTER — Other Ambulatory Visit: Payer: Self-pay

## 2020-01-10 DIAGNOSIS — Z79899 Other long term (current) drug therapy: Secondary | ICD-10-CM | POA: Insufficient documentation

## 2020-01-10 DIAGNOSIS — K2980 Duodenitis without bleeding: Secondary | ICD-10-CM | POA: Diagnosis not present

## 2020-01-10 DIAGNOSIS — I10 Essential (primary) hypertension: Secondary | ICD-10-CM | POA: Insufficient documentation

## 2020-01-10 DIAGNOSIS — F1210 Cannabis abuse, uncomplicated: Secondary | ICD-10-CM | POA: Diagnosis not present

## 2020-01-10 DIAGNOSIS — F17210 Nicotine dependence, cigarettes, uncomplicated: Secondary | ICD-10-CM | POA: Diagnosis not present

## 2020-01-10 DIAGNOSIS — R1010 Upper abdominal pain, unspecified: Secondary | ICD-10-CM | POA: Diagnosis not present

## 2020-01-10 DIAGNOSIS — R1030 Lower abdominal pain, unspecified: Secondary | ICD-10-CM | POA: Diagnosis not present

## 2020-01-10 DIAGNOSIS — J45909 Unspecified asthma, uncomplicated: Secondary | ICD-10-CM | POA: Diagnosis not present

## 2020-01-10 DIAGNOSIS — R109 Unspecified abdominal pain: Secondary | ICD-10-CM | POA: Diagnosis present

## 2020-01-10 LAB — CBC
HCT: 40.8 % (ref 36.0–46.0)
Hemoglobin: 14.1 g/dL (ref 12.0–15.0)
MCH: 32.7 pg (ref 26.0–34.0)
MCHC: 34.6 g/dL (ref 30.0–36.0)
MCV: 94.7 fL (ref 80.0–100.0)
Platelets: 264 10*3/uL (ref 150–400)
RBC: 4.31 MIL/uL (ref 3.87–5.11)
RDW: 13 % (ref 11.5–15.5)
WBC: 5.6 10*3/uL (ref 4.0–10.5)
nRBC: 0 % (ref 0.0–0.2)

## 2020-01-10 NOTE — ED Triage Notes (Signed)
Pt with luq pain for 5 days with diarrhea, fever and vomiting. Pt appears in no acute distress. Pt with yellow sclera noted.

## 2020-01-11 ENCOUNTER — Emergency Department
Admission: EM | Admit: 2020-01-11 | Discharge: 2020-01-11 | Disposition: A | Discharge status: Home / Self Care | Payer: Medicaid Other | Attending: Emergency Medicine | Admitting: Emergency Medicine

## 2020-01-11 ENCOUNTER — Encounter: Payer: Self-pay | Admitting: Radiology

## 2020-01-11 ENCOUNTER — Emergency Department: Payer: Medicaid Other

## 2020-01-11 DIAGNOSIS — R1010 Upper abdominal pain, unspecified: Secondary | ICD-10-CM

## 2020-01-11 DIAGNOSIS — R1030 Lower abdominal pain, unspecified: Secondary | ICD-10-CM

## 2020-01-11 DIAGNOSIS — K2980 Duodenitis without bleeding: Secondary | ICD-10-CM

## 2020-01-11 LAB — COMPREHENSIVE METABOLIC PANEL
ALT: 7 U/L (ref 0–44)
AST: 16 U/L (ref 15–41)
Albumin: 3.9 g/dL (ref 3.5–5.0)
Alkaline Phosphatase: 51 U/L (ref 38–126)
Anion gap: 8 (ref 5–15)
BUN: 12 mg/dL (ref 6–20)
CO2: 26 mmol/L (ref 22–32)
Calcium: 9.4 mg/dL (ref 8.9–10.3)
Chloride: 106 mmol/L (ref 98–111)
Creatinine, Ser: 0.92 mg/dL (ref 0.44–1.00)
GFR calc Af Amer: >60 mL/min (ref >60)
GFR calc non Af Amer: >60 mL/min (ref >60)
Glucose, Bld: 99 mg/dL (ref 70–99)
Potassium: 4.1 mmol/L (ref 3.5–5.1)
Sodium: 140 mmol/L (ref 135–145)
Total Bilirubin: 0.9 mg/dL (ref 0.3–1.2)
Total Protein: 7.6 g/dL (ref 6.5–8.1)

## 2020-01-11 LAB — TROPONIN I (HIGH SENSITIVITY)
Troponin I (High Sensitivity): 10 ng/L (ref <18)
Troponin I (High Sensitivity): 10 ng/L (ref <18)

## 2020-01-11 LAB — URINALYSIS, COMPLETE (UACMP) WITH MICROSCOPIC
Bilirubin Urine: NEGATIVE
Glucose, UA: NEGATIVE mg/dL
Hgb urine dipstick: NEGATIVE
Ketones, ur: NEGATIVE mg/dL
Leukocytes,Ua: NEGATIVE
Nitrite: NEGATIVE
Protein, ur: NEGATIVE mg/dL
Specific Gravity, Urine: 1.012 (ref 1.005–1.030)
WBC, UA: NONE SEEN WBC/hpf (ref 0–5)
pH: 8 (ref 5.0–8.0)

## 2020-01-11 LAB — LIPASE, BLOOD: Lipase: 61 U/L — ABNORMAL HIGH (ref 11–51)

## 2020-01-11 MED ORDER — PANTOPRAZOLE SODIUM 20 MG PO TBEC
20.0000 mg | DELAYED_RELEASE_TABLET | Freq: Every day | ORAL | 0 refills | Status: DC
Start: 2020-01-11 — End: 2020-04-30

## 2020-01-11 MED ORDER — DICYCLOMINE HCL 10 MG PO CAPS: 10.0000 mg | ORAL_CAPSULE | Freq: Four times a day (QID) | ORAL | 0 refills | Status: DC

## 2020-01-11 MED ORDER — ONDANSETRON 4 MG PO TBDP
4.0000 mg | ORAL_TABLET | Freq: Three times a day (TID) | ORAL | 0 refills | Status: DC | PRN
Start: 2020-01-11 — End: 2020-03-23

## 2020-01-11 MED ORDER — SODIUM CHLORIDE 0.9 % IV BOLUS
1000.0000 mL | Freq: Once | INTRAVENOUS | Status: AC
  Administered 2020-01-11: 1000 mL via INTRAVENOUS

## 2020-01-11 MED ORDER — HYDROMORPHONE HCL 1 MG/ML IJ SOLN
1.0000 mg | Freq: Once | INTRAMUSCULAR | Status: AC
  Administered 2020-01-11: 1 mg via INTRAVENOUS
  Filled 2020-01-11: qty 1

## 2020-01-11 MED ORDER — IOHEXOL 300 MG/ML  SOLN
100.0000 mL | Freq: Once | INTRAMUSCULAR | Status: AC | PRN
  Administered 2020-01-11: 100 mL via INTRAVENOUS

## 2020-01-11 MED ORDER — ONDANSETRON HCL 4 MG/2ML IJ SOLN
4.0000 mg | Freq: Once | INTRAMUSCULAR | Status: AC
  Administered 2020-01-11: 4 mg via INTRAVENOUS
  Filled 2020-01-11: qty 2

## 2020-01-11 MED ORDER — SUCRALFATE 1 G PO TABS
1.0000 g | ORAL_TABLET | Freq: Three times a day (TID) | ORAL | 0 refills | Status: DC
Start: 2020-01-11 — End: 2020-04-03

## 2020-01-11 MED ORDER — PANTOPRAZOLE SODIUM 40 MG IV SOLR
40.0000 mg | Freq: Once | INTRAVENOUS | Status: AC
  Administered 2020-01-11: 40 mg via INTRAVENOUS
  Filled 2020-01-11: qty 40

## 2020-01-11 MED ORDER — DROPERIDOL 2.5 MG/ML IJ SOLN
2.5000 mg | Freq: Once | INTRAMUSCULAR | Status: AC
  Administered 2020-01-11: 2.5 mg via INTRAVENOUS
  Filled 2020-01-11: qty 2

## 2020-01-11 NOTE — Discharge Instructions (Addendum)
Take the acid reducer and the Carafate to help with this if it is related to an ulcer.  Take the Bentyl to help with spasming of your intestines.  If you continue to have diarrhea you should take a stool sample to your primary care doctor to make sure there is no evidence of C. difficile given your prior antibiotics.  You should call the GI doctor for follow-up.  Return to ER if develop worsening symptoms, fevers, not able to eat or drink or any other concerns  IMPRESSION: 1. There appear to be 2 corpus luteum cysts in the left ovary. The right ovary contains a dominant follicle measuring 1.6 cm. No evidence of torsion on today's study. The uterus is surgically Absent.  IMPRESSION: 1. Question some mild thickening versus underdistention of the duodenum but without significant peraduodenal inflammatory change. Additional fluid-filled loops of small bowel elsewhere without dilatation or other wall thickening. Findings could reflect a duodenitis and/or enteritis. Correlate with symptoms. 2. The bilateral ovaries appeared slightly medialized and low-attenuation with numerous peripheral enhancing crenulated follicles. Small amount adjacent fluid in the cul-de-sac, nonspecific in the reproductive age female. Correlate for pelvic symptoms and consider ultrasound if clinically warranted. 3. Colonic diverticulosis without evidence of diverticulitis. 4. Mild body wall edema. 5. Prior cholecystectomy.

## 2020-01-11 NOTE — ED Notes (Addendum)
Pt to CT  Pt reports unable to deficate

## 2020-01-11 NOTE — ED Notes (Signed)
Peripheral IV discontinued. Catheter intact. No signs of infiltration or redness. Gauze applied to IV site.   Discharge instructions reviewed with patient. Questions fielded by this RN. Patient verbalizes understanding of instructions. Patient discharged home in stable condition per [facility]. No acute distress noted at time of discharge.   Pt calling to get pick up from ED, [place] to lobby

## 2020-01-11 NOTE — ED Provider Notes (Signed)
[facility] Emergency Department Provider Note  ____________________________________________   First MD Initiated Contact with Patient 01/11/20 0211     (approximate)  I have reviewed the triage vital signs and the nursing notes.   HISTORY  Chief Complaint Abdominal Pain    HPI Sarah Sosa is a 39 y.o. female with pancreatitis, PID, uterine leiomyoma status post hysterectomy who comes in with abdominal pain.  Patient reports left upper quadrant abdominal pain.  Patient states that she was diagnosed with pancreatitis about a month ago.  Her pain never really went away fully but is acutely worsened over the past few days.  She states that her pain was never resolved upon discharge but they "kicked her out". She denies drinking any alcohol recently except for a little bit of wine on Thursday.  She states the pain is severe, constant, nothing makes it better, nothing makes it worse.  States that she does not have a primary care doctor or her pain doctor any longer.  She does report some nausea vomiting low-grade temps and diarrhea as well.   Denies any lower abdominal pain, vaginal symptoms, dysuria.  Does report some diarrhea for the past few days.  States that she has not had any today.  States that she did have antibiotics about a month ago.  On review of records patient was discharged last month for acute pancreatitis mostly secondary to her hydrochlorothiazide.          Past Medical History:  Diagnosis Date  . Arthritis   . Asthma   . Chronic pain disorder 03/19/2018  . GERD (gastroesophageal reflux disease)   . Headache   . Heart murmur   . History of trichomoniasis 03/19/2018  . Hypertension   . Pancreatitis   . PID (pelvic inflammatory disease) 02/14/2018  . Uterine leiomyoma 03/19/2018    Patient Active Problem List   Diagnosis Date Noted  . Acute pancreatitis 12/09/2019  . Ileus ([place]) 04/21/2018  . Postoperative ileus ([place]) 04/20/2018  .  Postoperative state 04/15/2018  . Uterine leiomyoma 03/19/2018  . Menorrhagia with regular cycle 03/19/2018  . Chronic pain disorder 03/19/2018  . Pelvic pain 03/19/2018  . Essential hypertension 03/19/2018    Past Surgical History:  Procedure Laterality Date  . ABDOMINAL HYSTERECTOMY    . CHOLECYSTECTOMY    . DENTAL SURGERY    . HYSTERECTOMY ABDOMINAL WITH SALPINGECTOMY Bilateral 04/15/2018   Procedure: HYSTERECTOMY ABDOMINAL WITH BILATERAL SALPINGECTOMY;  Surgeon: Rubie Maid, MD;  Location: [facility] ORS;  Service: Gynecology;  Laterality: Bilateral;  . kenn surgery Bilateral   . KNEE ARTHROSCOPY Left 2012, 2013    Prior to Admission medications   Medication Sig Start Date End Date Taking? Authorizing Provider  acetaminophen (TYLENOL) 325 MG tablet Take 2 tablets (650 mg total) by mouth every 6 (six) hours as needed for mild pain (or Fever >/= 101). 12/11/19   Nolberto Hanlon, MD  ALPRAZolam Duanne Moron) 0.25 MG tablet Take 0.25 mg by mouth 2 (two) times daily as needed.     [provider]  amLODipine (NORVASC) 5 MG tablet Take 1 tablet (5 mg total) by mouth daily. 12/11/19 12/10/20  Nolberto Hanlon, MD  diclofenac Sodium (VOLTAREN) 1 % GEL SMARTSIG:5 Gram(s) Topical 4 Times Daily 11/29/19   [provider]  diphenhydramine-acetaminophen (TYLENOL PM) 25-500 MG TABS tablet Take 2 tablets by mouth at bedtime as needed (sleep).    [provider]  lisinopril (ZESTRIL) 20 MG tablet Take 1 tablet (20 mg total) by  mouth daily. 12/12/19   Nolberto Hanlon, MD  oxyCODONE-acetaminophen (PERCOCET) 7.5-325 MG tablet Take 1 tablet by mouth 4 (four) times daily.     [provider]    Allergies Latex, Nsaids, Tomato, and Tramadol  Family History  Problem Relation Age of Onset  . Fibroids Mother   . Hypertension Mother   . Arthritis Mother     Social History Social History   Tobacco Use  . Smoking status: Current Some Day Smoker    Packs/day: 0.50    Years: 22.00     Pack years: 11.00    Types: Cigarettes  . Smokeless tobacco: Never Used  Vaping Use  . Vaping Use: Never used  Substance Use Topics  . Alcohol use: Yes    Comment: occasional  . Drug use: Yes    Types: Marijuana      Review of Systems Constitutional: No fever/chills Eyes: No visual changes. ENT: No sore throat. Cardiovascular: Denies chest pain. Respiratory: Denies shortness of breath. Gastrointestinal: Positive abdominal pain, vomiting, diarrhea now resolving. Genitourinary: Negative for dysuria. Musculoskeletal: Negative for back pain. Skin: Negative for rash. Neurological: Negative for headaches, focal weakness or numbness. All other ROS negative ____________________________________________   PHYSICAL EXAM:  VITAL SIGNS: ED Triage Vitals [01/10/20 2326]  Enc Vitals Group     BP (!) 164/105     Pulse Rate 76     Resp 18     Temp 99.1 F (37.3 C)     Temp Source Oral     SpO2 100 %     Weight 206 lb (93.4 kg)     Height 5\' 6"  (1.676 m)     Head Circumference      Peak Flow      Pain Score 10     Pain Loc      Pain Edu?      Excl. in [place]?     Constitutional: Alert and oriented. Well appearing and in no acute distress. Eyes: Conjunctivae are normal. EOMI. Head: Atraumatic. Nose: No congestion/rhinnorhea. Mouth/Throat: Mucous membranes are moist.   Neck: No stridor. Trachea Midline. FROM Cardiovascular: Normal rate, regular rhythm. Grossly normal heart sounds.  Good peripheral circulation. Respiratory: Normal respiratory effort.  No retractions. Lungs CTAB. Gastrointestinal: Soft with tenderness in the left upper quadrant no distention. No abdominal bruits.  Musculoskeletal: No lower extremity tenderness nor edema.  No joint effusions. Neurologic:  Normal speech and language. No gross focal neurologic deficits are appreciated.  Skin:  Skin is warm, dry and intact. No rash noted. Psychiatric: Mood and affect are normal. Speech and behavior are normal. GU:  Deferred   ____________________________________________   LABS (all labs ordered are listed, but only abnormal results are displayed)  Labs Reviewed  LIPASE, BLOOD - Abnormal; Notable for the following components:      Result Value   Lipase 61 (*)    All other components within normal limits  URINALYSIS, COMPLETE (UACMP) WITH MICROSCOPIC - Abnormal; Notable for the following components:   Color, Urine YELLOW (*)    APPearance CLEAR (*)    Bacteria, UA RARE (*)    All other components within normal limits  GASTROINTESTINAL PANEL BY PCR, STOOL (REPLACES STOOL CULTURE)  C DIFFICILE QUICK SCREEN W PCR REFLEX  COMPREHENSIVE METABOLIC PANEL  CBC  TROPONIN I (HIGH SENSITIVITY)  TROPONIN I (HIGH SENSITIVITY)   ____________________________________________   ED ECG REPORT I, Vanessa Mellette, the attending physician, personally viewed and interpreted this ECG.  Normal sinus rate of  67, no ST elevation, T wave inversion in lead III, normal intervals.  T wave inversions similar ____________________________________________  RADIOLOGY   Official radiology report(s): CT ABDOMEN PELVIS W CONTRAST  Result Date: 01/11/2020 CLINICAL DATA:  Left upper quadrant pain for 5 days with diarrhea, fever and emesis EXAM: CT ABDOMEN AND PELVIS WITH CONTRAST TECHNIQUE: Multidetector CT imaging of the abdomen and pelvis was performed using the standard protocol following bolus administration of intravenous contrast. CONTRAST:  142mL OMNIPAQUE IOHEXOL 300 MG/ML  SOLN COMPARISON:  CT 08/04/2019 FINDINGS: Lower chest: Some dependent ground-glass opacity likely reflecting atelectatic change. Additional bandlike areas of opacity reflecting further subsegmental atelectasis or scarring. Normal heart size. No pericardial effusion. Hepatobiliary: No worrisome focal liver lesions. Smooth liver surface contour. Normal hepatic attenuation. Patient is post cholecystectomy. Slight prominence of the biliary tree may be  related to post cholecystectomy reservoir effect without visible calcified intraductal gallstones. Normal distal tapering of the bile duct at the level of the pancreatic head. Pancreas: Mildly motion degraded imaging about the level of the pancreatic head and uncinate. No discernible pancreatic inflammation, ductal dilatation, or discrete lesions are identified. Spleen: Normal in size without focal abnormality. Adrenals/Urinary Tract: Normal adrenal glands. Kidneys enhance symmetrically and uniformly. No concerning renal lesions. No urolithiasis or hydronephrosis. No gross bladder abnormality Stomach/Bowel: Distal esophagus, stomach are unremarkable for the degree of distention. Question some mild thickening versus underdistention of the duodenum but without significant peraduodenal inflammatory change. No other small bowel thickening or dilatation. Redemonstrated swirling of the mesenteric vessels in the central and right lower abdomen which is unchanged from multiple prior comparisons. Multiple fluid-filled loops of small bowel are nonspecific. There is a moderate colonic stool burden. Scattered colonic diverticula without focal inflammation to suggest diverticulitis. Vascular/Lymphatic: Atherosclerotic plaque within the normal caliber aorta. Swelling of the mesentery, as above. No enlarged abdominal or pelvic lymph nodes. Reproductive: Patient is post hysterectomy. The bilateral ovaries appeared slightly medialized and low-attenuation with numerous crenulated enhancing follicles, some which are likely to reflect a corpus luteum. Small amount of fluid is present in the posterior cul-de-sac. Other: Fluid in the posterior cul-de-sac as above. Postsurgical changes of the abdominal wall. No bowel containing hernias. Mild body wall edema. Musculoskeletal: Multilevel degenerative changes are present in the imaged portions of the spine. No acute osseous abnormality or suspicious osseous lesion. IMPRESSION: 1. Question  some mild thickening versus underdistention of the duodenum but without significant peraduodenal inflammatory change. Additional fluid-filled loops of small bowel elsewhere without dilatation or other wall thickening. Findings could reflect a duodenitis and/or enteritis. Correlate with symptoms. 2. The bilateral ovaries appeared slightly medialized and low-attenuation with numerous peripheral enhancing crenulated follicles. Small amount adjacent fluid in the cul-de-sac, nonspecific in the reproductive age female. Correlate for pelvic symptoms and consider ultrasound if clinically warranted. 3. Colonic diverticulosis without evidence of diverticulitis. 4. Mild body wall edema. 5. Prior cholecystectomy. Electronically Signed   By: Lovena Le M.D.   On: 01/11/2020 03:38   US PELVIC COMPLETE W TRANSVAGINAL AND TORSION R/O  Result Date: 01/11/2020 CLINICAL DATA:  Lower abdominal pain.  Previous hysterectomy. EXAM: TRANSABDOMINAL AND TRANSVAGINAL ULTRASOUND OF PELVIS DOPPLER ULTRASOUND OF OVARIES TECHNIQUE: Both transabdominal and transvaginal ultrasound examinations of the pelvis were performed. Transabdominal technique was performed for global imaging of the pelvis including uterus, ovaries, adnexal regions, and pelvic cul-de-sac. It was necessary to proceed with endovaginal exam following the transabdominal exam to visualize the ovaries. Color and duplex Doppler ultrasound was utilized to evaluate blood flow  to the ovaries. COMPARISON:  CT scan January 11, 2020 FINDINGS: Uterus Surgically absent. Right ovary Measurements: 3.5 x 2.1 x 2.8 cm = volume: 10.6 mL. Contains a 1.6 cm simple appearing follicle. Left ovary Measurements: 4.3 x 3.0 x 2.3 cm = volume: 15.1 mL. Contains 2 probable corpus luteum cysts. Pulsed Doppler evaluation of both ovaries demonstrates normal low-resistance arterial and venous waveforms. Other findings Trace fluid in the cul-de-sac. IMPRESSION: 1. There appear to be 2 corpus luteum cysts in  the left ovary. The right ovary contains a dominant follicle measuring 1.6 cm. No evidence of torsion on today's study. The uterus is surgically absent. Electronically Signed   By: Dorise Bullion III M.D   On: 01/11/2020 05:42    ____________________________________________   PROCEDURES  Procedure(s) performed (including Critical Care):  Procedures   ____________________________________________   INITIAL IMPRESSION / ASSESSMENT AND PLAN / ED COURSE  Sarah Sosa was evaluated in Emergency Department on 01/11/2020 for the symptoms described in the history of present illness. She was evaluated in the context of the global COVID-19 pandemic, which necessitated consideration that the patient might be at risk for infection with the SARS-CoV-2 virus that causes COVID-19. Institutional protocols and algorithms that pertain to the evaluation of patients at risk for COVID-19 are in a state of rapid change based on information released by regulatory bodies including the CDC and federal and state organizations. These policies and algorithms were followed during the patient's care in the ED.    Patient is a 39 year old who comes in with upper abdominal pain.  Most likely this secondary to gastritis, pancreatitis.  Patient is already had a cholecystectomy.  Will get LFTs and lipase to make sure no signs of retained stone.  Discussed with patient will get CT scan to make sure no evidence of perforation, obstruction etc.  She denies any concerns for vaginal symptoms such as PID.  Will get urine to evaluate for UTI but denies symptoms.  Will give 1 dose of IV narcotics and Zofran while pending work-up  Labs show slightly elevated lipase LFTs are normal No evidence of anemia No evidence of UTI Initial cardiac markers 10-->10 rule out.  Reevaluated patient.  CT scan shows possible duodenitis versus enteritis.  Patient did have few days of diarrhea but denies any today.  Patient unable to give a stool  sample.  Discussed with patient I would not want to give any other antibiotics given she is afebrile with no white count due to the risk of C. difficile.  However discussed with her that if she is able to give [place] a stool sample that would be helpful.  The CT also read some abnormalities in her ovaries today consider pelvic ultrasound.  Patient states it is difficult to tell whether or not she is got pain in the lower abdomen since most of her pain is the upper abdomen.  She states that since she had her hysterectomy she does not have as much sensation in her lower abdomen.  She denies any pelvic discharge or concerns for STDs and declines pelvic exam but she is open to doing ultrasound to make sure that is her ovaries look okay  On reevaluation patient stated that she still having discomfort.  Patient states that the Dilaudid did not help.  Discussed with patient that given the above CT scan that IV narcotics are not recommended.  We will give a dose of droperidol to help with her discomfort.  Patient is willing to try this.  Patient already given a dose of Protonix to help with the possible duodenitis.  Patient still unable to give a stool sample.  Ultrasound showed 2 corpus luteum cyst in the left ovary with a dominant follicle in the right ovary no evidence of torsion.    On review of records patient's had multiple presentations for abdominal pain to the emergency room.  At this time her pain could be flared up secondary to a resolving enteritis versus duodenitis vs chronic abd pain vs IBS/UC/Chrohn's.   Discussed with patient going home with a PPI, Bentyl, Zofran, Carafate and GI follow-up but there was no indications for narcotics and this could make things worse.   6:18 AM reevaluated patient.  She states that her pain is better.  Discussed the results with patient.  Patient feels comfortable going home.  Patient tolerating drinking water.  Discussed with patient that if she has return of diarrhea  she should have it sent off for testing to make sure no evidence of C. Difficile.  I discussed the provisional nature of ED diagnosis, the treatment so far, the ongoing plan of care, follow up appointments and return precautions with the patient and any family or support people present. They expressed understanding and agreed with the plan, discharged home. ____________________________________   FINAL CLINICAL IMPRESSION(S) / ED DIAGNOSES   Final diagnoses:  Lower abdominal pain  Upper abdominal pain  Duodenitis      MEDICATIONS GIVEN DURING THIS VISIT:  Medications  sodium chloride 0.9 % bolus 1,000 mL (0 mLs Intravenous Stopped 01/11/20 0354)  HYDROmorphone (DILAUDID) injection 1 mg (1 mg Intravenous Given 01/11/20 0251)  ondansetron (ZOFRAN) injection 4 mg (4 mg Intravenous Given 01/11/20 0249)  pantoprazole (PROTONIX) injection 40 mg (40 mg Intravenous Given 01/11/20 0253)  iohexol (OMNIPAQUE) 300 MG/ML solution 100 mL (100 mLs Intravenous Contrast Given 01/11/20 0308)  droperidol (INAPSINE) 2.5 MG/ML injection 2.5 mg (2.5 mg Intravenous Given 01/11/20 0457)     ED Discharge Orders         Ordered    dicyclomine (BENTYL) 10 MG capsule  4 times daily     Discontinue  Reprint     01/11/20 0620    sucralfate (CARAFATE) 1 g tablet  3 times daily with meals & bedtime     Discontinue  Reprint     01/11/20 0620    pantoprazole (PROTONIX) 20 MG tablet  Daily     Discontinue  Reprint     01/11/20 0620    ondansetron (ZOFRAN ODT) 4 MG disintegrating tablet  Every 8 hours PRN     Discontinue  Reprint     01/11/20 4010           Note:  This document was prepared using Dragon voice recognition software and may include unintentional dictation errors.   Vanessa Loaza, MD 01/11/20 [phone]

## 2020-01-11 NOTE — ED Notes (Signed)
Pt to [place] att

## 2020-03-08 ENCOUNTER — Other Ambulatory Visit: Payer: Self-pay

## 2020-03-08 ENCOUNTER — Emergency Department
Admission: EM | Admit: 2020-03-08 | Discharge: 2020-03-08 | Disposition: A | Discharge status: Home / Self Care | Payer: Medicaid Other | Attending: Emergency Medicine | Admitting: Emergency Medicine

## 2020-03-08 DIAGNOSIS — Z5321 Procedure and treatment not carried out due to patient leaving prior to being seen by health care provider: Secondary | ICD-10-CM | POA: Diagnosis not present

## 2020-03-08 DIAGNOSIS — R197 Diarrhea, unspecified: Secondary | ICD-10-CM | POA: Diagnosis not present

## 2020-03-08 DIAGNOSIS — R112 Nausea with vomiting, unspecified: Secondary | ICD-10-CM | POA: Insufficient documentation

## 2020-03-08 DIAGNOSIS — R1010 Upper abdominal pain, unspecified: Secondary | ICD-10-CM | POA: Diagnosis not present

## 2020-03-08 LAB — CBC
HCT: 41.5 % (ref 36.0–46.0)
Hemoglobin: 13.9 g/dL (ref 12.0–15.0)
MCH: 32.7 pg (ref 26.0–34.0)
MCHC: 33.5 g/dL (ref 30.0–36.0)
MCV: 97.6 fL (ref 80.0–100.0)
Platelets: 269 10*3/uL (ref 150–400)
RBC: 4.25 MIL/uL (ref 3.87–5.11)
RDW: 13.2 % (ref 11.5–15.5)
WBC: 5.2 10*3/uL (ref 4.0–10.5)
nRBC: 0 % (ref 0.0–0.2)

## 2020-03-08 LAB — COMPREHENSIVE METABOLIC PANEL
ALT: 7 U/L (ref 0–44)
AST: 19 U/L (ref 15–41)
Albumin: 3.6 g/dL (ref 3.5–5.0)
Alkaline Phosphatase: 50 U/L (ref 38–126)
Anion gap: 15 (ref 5–15)
BUN: 13 mg/dL (ref 6–20)
CO2: 20 mmol/L — ABNORMAL LOW (ref 22–32)
Calcium: 9.4 mg/dL (ref 8.9–10.3)
Chloride: 105 mmol/L (ref 98–111)
Creatinine, Ser: 1.02 mg/dL — ABNORMAL HIGH (ref 0.44–1.00)
GFR calc Af Amer: >60 mL/min (ref >60)
GFR calc non Af Amer: >60 mL/min (ref >60)
Glucose, Bld: 184 mg/dL — ABNORMAL HIGH (ref 70–99)
Potassium: 3.2 mmol/L — ABNORMAL LOW (ref 3.5–5.1)
Sodium: 140 mmol/L (ref 135–145)
Total Bilirubin: 0.6 mg/dL (ref 0.3–1.2)
Total Protein: 7.1 g/dL (ref 6.5–8.1)

## 2020-03-08 LAB — URINALYSIS, COMPLETE (UACMP) WITH MICROSCOPIC
Bacteria, UA: NONE SEEN
Bilirubin Urine: NEGATIVE
Glucose, UA: NEGATIVE mg/dL
Hgb urine dipstick: NEGATIVE
Ketones, ur: NEGATIVE mg/dL
Leukocytes,Ua: NEGATIVE
Nitrite: NEGATIVE
Protein, ur: 30 mg/dL — AB
Specific Gravity, Urine: 1.021 (ref 1.005–1.030)
pH: 7 (ref 5.0–8.0)

## 2020-03-08 LAB — LIPASE, BLOOD: Lipase: 49 U/L (ref 11–51)

## 2020-03-08 NOTE — ED Triage Notes (Signed)
Patient c/o upper abdominal pain N/V/D. Reports hx of pancreatitis. Reports this feels the same.

## 2020-03-09 ENCOUNTER — Telehealth: Payer: Self-pay | Admitting: Emergency Medicine

## 2020-03-09 NOTE — Telephone Encounter (Signed)
Called patient due to lwot to inquire about condition and follow up plans. Left message.   

## 2020-03-22 ENCOUNTER — Other Ambulatory Visit: Payer: Self-pay

## 2020-03-22 DIAGNOSIS — J45909 Unspecified asthma, uncomplicated: Secondary | ICD-10-CM | POA: Diagnosis not present

## 2020-03-22 DIAGNOSIS — Z91040 Latex allergy status: Secondary | ICD-10-CM | POA: Diagnosis not present

## 2020-03-22 DIAGNOSIS — B9689 Other specified bacterial agents as the cause of diseases classified elsewhere: Secondary | ICD-10-CM | POA: Diagnosis not present

## 2020-03-22 DIAGNOSIS — I10 Essential (primary) hypertension: Secondary | ICD-10-CM | POA: Diagnosis not present

## 2020-03-22 DIAGNOSIS — Z79899 Other long term (current) drug therapy: Secondary | ICD-10-CM | POA: Insufficient documentation

## 2020-03-22 DIAGNOSIS — R1012 Left upper quadrant pain: Secondary | ICD-10-CM | POA: Diagnosis present

## 2020-03-22 DIAGNOSIS — F17210 Nicotine dependence, cigarettes, uncomplicated: Secondary | ICD-10-CM | POA: Insufficient documentation

## 2020-03-22 DIAGNOSIS — N390 Urinary tract infection, site not specified: Secondary | ICD-10-CM | POA: Insufficient documentation

## 2020-03-22 DIAGNOSIS — Z8742 Personal history of other diseases of the female genital tract: Secondary | ICD-10-CM | POA: Insufficient documentation

## 2020-03-22 DIAGNOSIS — R112 Nausea with vomiting, unspecified: Secondary | ICD-10-CM | POA: Insufficient documentation

## 2020-03-22 LAB — COMPREHENSIVE METABOLIC PANEL
ALT: 9 U/L (ref 0–44)
AST: 18 U/L (ref 15–41)
Albumin: 3.7 g/dL (ref 3.5–5.0)
Alkaline Phosphatase: 53 U/L (ref 38–126)
Anion gap: 9 (ref 5–15)
BUN: 10 mg/dL (ref 6–20)
CO2: 23 mmol/L (ref 22–32)
Calcium: 9.3 mg/dL (ref 8.9–10.3)
Chloride: 105 mmol/L (ref 98–111)
Creatinine, Ser: 0.86 mg/dL (ref 0.44–1.00)
GFR calc Af Amer: >60 mL/min (ref >60)
GFR calc non Af Amer: >60 mL/min (ref >60)
Glucose, Bld: 138 mg/dL — ABNORMAL HIGH (ref 70–99)
Potassium: 3.4 mmol/L — ABNORMAL LOW (ref 3.5–5.1)
Sodium: 137 mmol/L (ref 135–145)
Total Bilirubin: 0.8 mg/dL (ref 0.3–1.2)
Total Protein: 7.4 g/dL (ref 6.5–8.1)

## 2020-03-22 LAB — CBC
HCT: 40.6 % (ref 36.0–46.0)
Hemoglobin: 14.2 g/dL (ref 12.0–15.0)
MCH: 33.1 pg (ref 26.0–34.0)
MCHC: 35 g/dL (ref 30.0–36.0)
MCV: 94.6 fL (ref 80.0–100.0)
Platelets: 278 10*3/uL (ref 150–400)
RBC: 4.29 MIL/uL (ref 3.87–5.11)
RDW: 12.6 % (ref 11.5–15.5)
WBC: 5.8 10*3/uL (ref 4.0–10.5)
nRBC: 0 % (ref 0.0–0.2)

## 2020-03-22 LAB — URINALYSIS, COMPLETE (UACMP) WITH MICROSCOPIC
Bilirubin Urine: NEGATIVE
Glucose, UA: NEGATIVE mg/dL
Ketones, ur: NEGATIVE mg/dL
Nitrite: POSITIVE — AB
Protein, ur: 30 mg/dL — AB
Specific Gravity, Urine: 1.017 (ref 1.005–1.030)
WBC, UA: >50 WBC/hpf — ABNORMAL HIGH (ref 0–5)
pH: 6 (ref 5.0–8.0)

## 2020-03-22 LAB — LIPASE, BLOOD: Lipase: 36 U/L (ref 11–51)

## 2020-03-22 LAB — POCT PREGNANCY, URINE: Preg Test, Ur: NEGATIVE

## 2020-03-22 NOTE — ED Notes (Signed)
NT attempted phlebotomy, unsuccessful. Called lab for blood draw

## 2020-03-22 NOTE — ED Triage Notes (Signed)
PT to ED c/o kidney pain, LUQ pain (hx of pancreatitis), and throwing up which has recently turned to throwing up bright red blood. PT came last week but left before being seen. PT reports low grade fevers as well and diarrhea.

## 2020-03-23 ENCOUNTER — Emergency Department: Payer: Medicaid Other

## 2020-03-23 ENCOUNTER — Emergency Department
Admission: EM | Admit: 2020-03-23 | Discharge: 2020-03-23 | Disposition: A | Discharge status: Home / Self Care | Payer: Medicaid Other | Attending: Emergency Medicine | Admitting: Emergency Medicine

## 2020-03-23 DIAGNOSIS — R112 Nausea with vomiting, unspecified: Secondary | ICD-10-CM

## 2020-03-23 DIAGNOSIS — N390 Urinary tract infection, site not specified: Secondary | ICD-10-CM

## 2020-03-23 DIAGNOSIS — R1012 Left upper quadrant pain: Secondary | ICD-10-CM

## 2020-03-23 LAB — LACTIC ACID, PLASMA: Lactic Acid, Venous: 0.9 mmol/L (ref 0.5–1.9)

## 2020-03-23 MED ORDER — MORPHINE SULFATE (PF) 4 MG/ML IV SOLN
4.0000 mg | Freq: Once | INTRAVENOUS | Status: AC
  Administered 2020-03-23: 4 mg via INTRAVENOUS
  Filled 2020-03-23: qty 1

## 2020-03-23 MED ORDER — SODIUM CHLORIDE 0.9 % IV BOLUS
1000.0000 mL | Freq: Once | INTRAVENOUS | Status: AC
  Administered 2020-03-23: 1000 mL via INTRAVENOUS

## 2020-03-23 MED ORDER — ONDANSETRON HCL 4 MG/2ML IJ SOLN
4.0000 mg | Freq: Once | INTRAMUSCULAR | Status: AC
  Administered 2020-03-23: 4 mg via INTRAVENOUS
  Filled 2020-03-23: qty 2

## 2020-03-23 MED ORDER — IOHEXOL 300 MG/ML  SOLN
100.0000 mL | Freq: Once | INTRAMUSCULAR | Status: AC | PRN
  Administered 2020-03-23: 100 mL via INTRAVENOUS

## 2020-03-23 MED ORDER — SODIUM CHLORIDE 0.9 % IV SOLN
1.0000 g | Freq: Once | INTRAVENOUS | Status: AC
  Administered 2020-03-23: 1 g via INTRAVENOUS
  Filled 2020-03-23: qty 10

## 2020-03-23 MED ORDER — IOHEXOL 9 MG/ML PO SOLN: 500.0000 mL | Freq: Two times a day (BID) | ORAL | Status: DC | PRN

## 2020-03-23 MED ORDER — CEPHALEXIN 500 MG PO CAPS
500.0000 mg | ORAL_CAPSULE | Freq: Three times a day (TID) | ORAL | 0 refills | Status: DC
Start: 2020-03-23 — End: 2020-04-03

## 2020-03-23 MED ORDER — ONDANSETRON 4 MG PO TBDP: 4.0000 mg | ORAL_TABLET | Freq: Three times a day (TID) | ORAL | 0 refills | Status: DC | PRN

## 2020-03-23 NOTE — ED Notes (Signed)
Patient verbalizes understanding of discharge instructions. Opportunity for questioning and answers were provided. Armband removed by staff, pt discharged from ED. Ambulated out to lobby  

## 2020-03-23 NOTE — ED Provider Notes (Signed)
Vitals:   03/22/20 1915 03/23/20 0549  BP:  106/68  Pulse:  80  Resp:  16  Temp: 98.9 F (37.2 C)   SpO2:  100%     ----------------------------------------- 8:30 AM on 03/23/2020 -----------------------------------------  CT Abdomen Pelvis W Contrast  Result Date: 03/23/2020 CLINICAL DATA:  Left upper quadrant abdominal pain. Personal history of appendicitis. Hematemesis. EXAM: CT ABDOMEN AND PELVIS WITH CONTRAST TECHNIQUE: Multidetector CT imaging of the abdomen and pelvis was performed using the standard protocol following bolus administration of intravenous contrast. CONTRAST:  165mL OMNIPAQUE IOHEXOL 300 MG/ML  SOLN COMPARISON:  CT the abdomen pelvis 01/11/2020. FINDINGS: Lower chest: Lung bases are clear without focal nodule, mass, or airspace disease. The heart size is upper limits of normal. No significant pleural or pericardial effusion is present. Hepatobiliary: Subcentimeter cystic lesion laterally in the right lobe on image 19 of series 2 is stable. No new lesions are present. Common bile duct is within normal limits following cholecystectomy. Pancreas: Unremarkable. No pancreatic ductal dilatation or surrounding inflammatory changes. Spleen: Normal in size without focal abnormality. Adrenals/Urinary Tract: Adrenal glands and kidneys are within normal limits bilaterally. Stone or mass lesion is present. Ureters are normal. The urinary bladder is within normal limits. Stomach/Bowel: The stomach and duodenum are within normal limits. Small bowel is unremarkable. Terminal ileum is within normal limits. The appendix is visualized and normal. The ascending and transverse colon are within normal limits. The descending and sigmoid colon are normal. Vascular/Lymphatic: Minimal atherosclerotic changes are noted in the aorta. No aneurysm is present. No significant retroperitoneal adenopathy is present. Reproductive: Status post hysterectomy. No adnexal masses. Other: No abdominal wall hernia or  abnormality. No abdominopelvic ascites. Musculoskeletal: Degenerative changes are noted in the facet joints of the lower lumbar spine. Degenerative changes are noted in the lower thoracic spine. Vertebral body heights are maintained. Bony pelvis is intact. Hips are located and normal. No focal lytic or blastic lesions are present. Degenerative changes are noted at the SI joints bilaterally. IMPRESSION: 1. No acute or focal lesion to explain the patient's symptoms. 2. Cholecystectomy and hysterectomy. 3. Aortic Atherosclerosis (ICD10-I70.0). Electronically Signed   By: San Morelle M.D.   On: 03/23/2020 07:55     Imaging reviewed, no acute findings.  Previous cholecystectomy hysterectomy.  Aortic atherosclerosis    ----------------------------------------- 8:30 AM on 03/23/2020 -----------------------------------------  Suspect patient's clinical history symptoms consistent with urinary tract infection possibly early pyelonephritis.  She is awake and alert, pain is controlled, she reports she has prescription oxycodone at home which she uses as an as needed pain medication at present.  Will discharge with antibiotics cephalexin and Zofran as written by Dr. Beather Arbour.  Patient comfortable with this plan.  Reviewed careful return precautions.  She is awake alert nontoxic.  Appears in no distress.  Has someone coming to drive her home and is not driving herself this morning.  Return precautions and treatment recommendations and follow-up discussed with the patient who is agreeable with the plan.    Sarah Kitten, MD 03/23/20 [phone]

## 2020-03-23 NOTE — ED Provider Notes (Signed)
[facility] Emergency Department Provider Note   ____________________________________________   First MD Initiated Contact with Patient 03/23/20 [phone]     (approximate)  I have reviewed the triage vital signs and the nursing notes.   HISTORY  Chief Complaint Abdominal Pain and Emesis    HPI Sarah Sosa is a 39 y.o. female who presents to the ED from home with a chief complaint of left flank/left upper quadrant pain, nausea/vomiting, low-grade fevers, dysuria and diarrhea.  Symptoms times several days.  Patient came to the ED last week but left without being seen.  Reports vomiting so much that now she is vomiting bright red streaks of blood.  History of pancreatitis.  Denies cough, chest pain, shortness of breath.  No history of kidney stones.      Past Medical History:  Diagnosis Date  . Arthritis   . Asthma   . Chronic pain disorder 03/19/2018  . GERD (gastroesophageal reflux disease)   . Headache   . Heart murmur   . History of trichomoniasis 03/19/2018  . Hypertension   . Pancreatitis   . PID (pelvic inflammatory disease) 02/14/2018  . Uterine leiomyoma 03/19/2018    Patient Active Problem List   Diagnosis Date Noted  . Acute pancreatitis 12/09/2019  . Ileus ([place]) 04/21/2018  . Postoperative ileus ([place]) 04/20/2018  . Postoperative state 04/15/2018  . Uterine leiomyoma 03/19/2018  . Menorrhagia with regular cycle 03/19/2018  . Chronic pain disorder 03/19/2018  . Pelvic pain 03/19/2018  . Essential hypertension 03/19/2018    Past Surgical History:  Procedure Laterality Date  . ABDOMINAL HYSTERECTOMY    . CHOLECYSTECTOMY    . DENTAL SURGERY    . HYSTERECTOMY ABDOMINAL WITH SALPINGECTOMY Bilateral 04/15/2018   Procedure: HYSTERECTOMY ABDOMINAL WITH BILATERAL SALPINGECTOMY;  Surgeon: Rubie Maid, MD;  Location: [facility] ORS;  Service: Gynecology;  Laterality: Bilateral;  . kenn surgery Bilateral   . KNEE ARTHROSCOPY Left 2012, 2013    Prior  to Admission medications   Medication Sig Start Date End Date Taking? Authorizing Provider  acetaminophen (TYLENOL) 325 MG tablet Take 2 tablets (650 mg total) by mouth every 6 (six) hours as needed for mild pain (or Fever >/= 101). 12/11/19   Nolberto Hanlon, MD  ALPRAZolam Duanne Moron) 0.25 MG tablet Take 0.25 mg by mouth 2 (two) times daily as needed.     [provider]  amLODipine (NORVASC) 5 MG tablet Take 1 tablet (5 mg total) by mouth daily. 12/11/19 12/10/20  Nolberto Hanlon, MD  cephALEXin (KEFLEX) 500 MG capsule Take 1 capsule (500 mg total) by mouth 3 (three) times daily. 03/23/20   Paulette Blanch, MD  diclofenac Sodium (VOLTAREN) 1 % GEL SMARTSIG:5 Gram(s) Topical 4 Times Daily 11/29/19   [provider]  dicyclomine (BENTYL) 10 MG capsule Take 1 capsule (10 mg total) by mouth 4 (four) times daily for 14 days. 01/11/20 01/25/20  Vanessa [place], MD  diphenhydramine-acetaminophen (TYLENOL PM) 25-500 MG TABS tablet Take 2 tablets by mouth at bedtime as needed (sleep).    [provider]  lisinopril (ZESTRIL) 20 MG tablet Take 1 tablet (20 mg total) by mouth daily. 12/12/19   Nolberto Hanlon, MD  ondansetron (ZOFRAN ODT) 4 MG disintegrating tablet Take 1 tablet (4 mg total) by mouth every 8 (eight) hours as needed for nausea or vomiting. 03/23/20   Paulette Blanch, MD  oxyCODONE-acetaminophen (PERCOCET) 7.5-325 MG tablet Take 1 tablet by mouth 4 (four) times daily.     [provider]  pantoprazole (PROTONIX) 20 MG tablet Take 1 tablet (20 mg total) by mouth daily. 01/11/20 02/10/20  Vanessa [place]ickshinny, MD  sucralfate (CARAFATE) 1 g tablet Take 1 tablet (1 g total) by mouth 4 (four) times daily -  with meals and at bedtime. 01/11/20 02/10/20  Vanessa Lisle, MD    Allergies Latex, Nsaids, Tomato, and Tramadol  Family History  Problem Relation Age of Onset  . Fibroids Mother   . Hypertension Mother   . Arthritis Mother     Social History Social History   Tobacco Use  . Smoking  status: Current Some Day Smoker    Packs/day: 0.50    Years: 22.00    Pack years: 11.00    Types: Cigarettes  . Smokeless tobacco: Never Used  Vaping Use  . Vaping Use: Never used  Substance Use Topics  . Alcohol use: Yes    Comment: occasional  . Drug use: Yes    Types: Marijuana    Review of Systems  Constitutional: Positive for low-grade fevers. Eyes: No visual changes. ENT: No sore throat. Cardiovascular: Denies chest pain. Respiratory: Denies shortness of breath. Gastrointestinal: Positive for abdominal and flank pain, nausea, vomiting and diarrhea.  No constipation. Genitourinary: Positive for dysuria. Musculoskeletal: Negative for back pain. Skin: Negative for rash. Neurological: Negative for headaches, focal weakness or numbness.   ____________________________________________   PHYSICAL EXAM:  VITAL SIGNS: ED Triage Vitals  Enc Vitals Group     BP 03/22/20 1915 (!) 194/112     Pulse Rate 03/22/20 1915 84     Resp 03/22/20 1915 16     Temp 03/22/20 1915 98.9 F (37.2 C)     Temp Source 03/22/20 1915 Oral     SpO2 03/22/20 1915 99 %     Weight 03/22/20 1915 212 lb (96.2 kg)     Height 03/22/20 1915 5\' 6"  (1.676 m)     Head Circumference --      Peak Flow --      Pain Score 03/22/20 1928 9     Pain Loc --      Pain Edu? --      Excl. in [place]? --     Constitutional: Alert and oriented. Well appearing and in mild acute distress. Eyes: Conjunctivae are normal. PERRL. EOMI. Head: Atraumatic. Nose: No congestion/rhinnorhea. Mouth/Throat: Mucous membranes are mildly dry.   Neck: No stridor.   Cardiovascular: Normal rate, regular rhythm. Grossly normal heart sounds.  Good peripheral circulation. Respiratory: Normal respiratory effort.  No retractions. Lungs CTAB. Gastrointestinal: Soft and mildly tender to palpation left upper quadrant without rebound or guarding. No distention. No abdominal bruits.  Mild left CVA tenderness. Musculoskeletal: No lower  extremity tenderness nor edema.  No joint effusions. Neurologic:  Normal speech and language. No gross focal neurologic deficits are appreciated. No gait instability. Skin:  Skin is warm, dry and intact. No rash noted.  No vesicles. Psychiatric: Mood and affect are normal. Speech and behavior are normal.  ____________________________________________   LABS (all labs ordered are listed, but only abnormal results are displayed)  Labs Reviewed  COMPREHENSIVE METABOLIC PANEL - Abnormal; Notable for the following components:      Result Value   Potassium 3.4 (*)    Glucose, Bld 138 (*)    All other components within normal limits  URINALYSIS, COMPLETE (UACMP) WITH MICROSCOPIC - Abnormal; Notable for the following components:   Color, Urine YELLOW (*)    APPearance CLOUDY (*)  Hgb urine dipstick SMALL (*)    Protein, ur 30 (*)    Nitrite POSITIVE (*)    Leukocytes,Ua LARGE (*)    WBC, UA >50 (*)    Bacteria, UA MANY (*)    All other components within normal limits  URINE CULTURE  LIPASE, BLOOD  CBC  LACTIC ACID, PLASMA  POC URINE PREG, ED  POCT PREGNANCY, URINE   ____________________________________________  EKG  ED ECG REPORT I, Ersie Savino J, the attending physician, personally viewed and interpreted this ECG.   Date: 03/23/2020  EKG Time: 1918  Rate: 87  Rhythm: normal EKG, normal sinus rhythm  Axis: Normal  Intervals:none  ST&T Change: Nonspecific  ____________________________________________  RADIOLOGY  ED MD interpretation: Pending  Official radiology report(s): No results found.  ____________________________________________   PROCEDURES  Procedure(s) performed (including Critical Care):  Procedures   ____________________________________________   INITIAL IMPRESSION / ASSESSMENT AND PLAN / ED COURSE  As part of my medical decision making, I reviewed the following data within the [facility] notes reviewed and  incorporated, Labs reviewed, EKG interpreted, Old chart reviewed, Radiograph reviewed and Notes from prior ED visits     Sarah Sosa was evaluated in Emergency Department on 03/23/2020 for the symptoms described in the history of present illness. She was evaluated in the context of the global COVID-19 pandemic, which necessitated consideration that the patient might be at risk for infection with the SARS-CoV-2 virus that causes COVID-19. Institutional protocols and algorithms that pertain to the evaluation of patients at risk for COVID-19 are in a state of rapid change based on information released by regulatory bodies including the CDC and federal and state organizations. These policies and algorithms were followed during the patient's care in the ED.    39 year old female presenting with left flank/upper quadrant pain, nausea/vomiting and dysuria. Differential diagnosis includes, but is not limited to, ovarian cyst, ovarian torsion, acute appendicitis, diverticulitis, urinary tract infection/pyelonephritis, endometriosis, bowel obstruction, colitis, renal colic, gastroenteritis, hernia, fibroids, endometriosis, etc.  Laboratory results unremarkable; UA positive for nitrite and leukocyte positive UTI.  Will initiate IV hydration, IV morphine for pain, IV Zofran for nausea.  Start IV antibiotic for UTI.  Obtain CT abdomen/pelvis to evaluate for pyelonephritis, pancreatitis, diverticulitis, etc.   Clinical Course as of Mar 24 655  Tue Mar 23, 2020  0653 Patient drinking oral contrast in preparation for CT scan.  Pain and nausea controlled at this time.  Care will be transferred to oncoming provider pending results of CT scan.   [JS]    Clinical Course User Index [JS] Paulette Blanch, MD     ____________________________________________   FINAL CLINICAL IMPRESSION(S) / ED DIAGNOSES  Final diagnoses:  Lower urinary tract infectious disease  Left upper quadrant abdominal pain  Non-intractable  vomiting with nausea, unspecified vomiting type     ED Discharge Orders         Ordered    cephALEXin (KEFLEX) 500 MG capsule  3 times daily        03/23/20 0654    ondansetron (ZOFRAN ODT) 4 MG disintegrating tablet  Every 8 hours PRN        03/23/20 0654           Note:  This document was prepared using Dragon voice recognition software and may include unintentional dictation errors.   Paulette Blanch, MD 03/23/20 [phone]

## 2020-03-23 NOTE — ED Notes (Signed)
Assumed care of pt upon being roomed, IV and labs obtained. Medicated per MAR. Awaiting CT at this time. AO x4. Pt resting comfortably with regular and unlabored breathing.

## 2020-03-23 NOTE — ED Notes (Signed)
Patient transported to CT 

## 2020-03-25 LAB — URINE CULTURE: Culture: >=100000 — AB

## 2020-04-03 ENCOUNTER — Inpatient Hospital Stay
Admission: EM | Admit: 2020-04-03 | Discharge: 2020-04-05 | DRG: 440 | Disposition: A | Discharge status: Home / Self Care | Payer: Medicaid Other | Attending: Internal Medicine | Admitting: Internal Medicine

## 2020-04-03 ENCOUNTER — Other Ambulatory Visit: Payer: Self-pay

## 2020-04-03 DIAGNOSIS — R197 Diarrhea, unspecified: Secondary | ICD-10-CM | POA: Diagnosis present

## 2020-04-03 DIAGNOSIS — R1013 Epigastric pain: Secondary | ICD-10-CM

## 2020-04-03 DIAGNOSIS — G8929 Other chronic pain: Secondary | ICD-10-CM | POA: Diagnosis present

## 2020-04-03 DIAGNOSIS — I160 Hypertensive urgency: Secondary | ICD-10-CM

## 2020-04-03 DIAGNOSIS — F1911 Other psychoactive substance abuse, in remission: Secondary | ICD-10-CM

## 2020-04-03 DIAGNOSIS — R52 Pain, unspecified: Secondary | ICD-10-CM

## 2020-04-03 DIAGNOSIS — Z90710 Acquired absence of both cervix and uterus: Secondary | ICD-10-CM

## 2020-04-03 DIAGNOSIS — K859 Acute pancreatitis without necrosis or infection, unspecified: Principal | ICD-10-CM | POA: Diagnosis present

## 2020-04-03 DIAGNOSIS — E876 Hypokalemia: Secondary | ICD-10-CM

## 2020-04-03 DIAGNOSIS — K852 Alcohol induced acute pancreatitis without necrosis or infection: Secondary | ICD-10-CM | POA: Diagnosis not present

## 2020-04-03 DIAGNOSIS — Z79899 Other long term (current) drug therapy: Secondary | ICD-10-CM

## 2020-04-03 DIAGNOSIS — K219 Gastro-esophageal reflux disease without esophagitis: Secondary | ICD-10-CM | POA: Diagnosis present

## 2020-04-03 DIAGNOSIS — R112 Nausea with vomiting, unspecified: Secondary | ICD-10-CM

## 2020-04-03 DIAGNOSIS — Z720 Tobacco use: Secondary | ICD-10-CM

## 2020-04-03 DIAGNOSIS — F17210 Nicotine dependence, cigarettes, uncomplicated: Secondary | ICD-10-CM | POA: Diagnosis present

## 2020-04-03 DIAGNOSIS — J45909 Unspecified asthma, uncomplicated: Secondary | ICD-10-CM | POA: Diagnosis present

## 2020-04-03 DIAGNOSIS — K861 Other chronic pancreatitis: Secondary | ICD-10-CM | POA: Diagnosis present

## 2020-04-03 DIAGNOSIS — Z20822 Contact with and (suspected) exposure to covid-19: Secondary | ICD-10-CM | POA: Diagnosis present

## 2020-04-03 DIAGNOSIS — M1990 Unspecified osteoarthritis, unspecified site: Secondary | ICD-10-CM | POA: Diagnosis present

## 2020-04-03 DIAGNOSIS — N739 Female pelvic inflammatory disease, unspecified: Secondary | ICD-10-CM | POA: Diagnosis present

## 2020-04-03 DIAGNOSIS — R1310 Dysphagia, unspecified: Secondary | ICD-10-CM | POA: Diagnosis present

## 2020-04-03 DIAGNOSIS — Z8249 Family history of ischemic heart disease and other diseases of the circulatory system: Secondary | ICD-10-CM

## 2020-04-03 DIAGNOSIS — Z8261 Family history of arthritis: Secondary | ICD-10-CM

## 2020-04-03 DIAGNOSIS — I10 Essential (primary) hypertension: Secondary | ICD-10-CM | POA: Diagnosis present

## 2020-04-03 DIAGNOSIS — Z9049 Acquired absence of other specified parts of digestive tract: Secondary | ICD-10-CM

## 2020-04-03 LAB — COMPREHENSIVE METABOLIC PANEL
ALT: 11 U/L (ref 0–44)
AST: 27 U/L (ref 15–41)
Albumin: 4.3 g/dL (ref 3.5–5.0)
Alkaline Phosphatase: 58 U/L (ref 38–126)
Anion gap: 12 (ref 5–15)
BUN: 10 mg/dL (ref 6–20)
CO2: 22 mmol/L (ref 22–32)
Calcium: 9.2 mg/dL (ref 8.9–10.3)
Chloride: 101 mmol/L (ref 98–111)
Creatinine, Ser: 0.76 mg/dL (ref 0.44–1.00)
GFR calc Af Amer: >60 mL/min (ref >60)
GFR calc non Af Amer: >60 mL/min (ref >60)
Glucose, Bld: 119 mg/dL — ABNORMAL HIGH (ref 70–99)
Potassium: 3.2 mmol/L — ABNORMAL LOW (ref 3.5–5.1)
Sodium: 135 mmol/L (ref 135–145)
Total Bilirubin: 1 mg/dL (ref 0.3–1.2)
Total Protein: 8 g/dL (ref 6.5–8.1)

## 2020-04-03 LAB — CBC
HCT: 40.9 % (ref 36.0–46.0)
HCT: 37 % (ref 36.0–46.0)
Hemoglobin: 13.9 g/dL (ref 12.0–15.0)
Hemoglobin: 12.5 g/dL (ref 12.0–15.0)
MCH: 32.7 pg (ref 26.0–34.0)
MCH: 32.6 pg (ref 26.0–34.0)
MCHC: 34 g/dL (ref 30.0–36.0)
MCHC: 33.8 g/dL (ref 30.0–36.0)
MCV: 96.2 fL (ref 80.0–100.0)
MCV: 96.4 fL (ref 80.0–100.0)
Platelets: 176 10*3/uL (ref 150–400)
Platelets: 144 10*3/uL — ABNORMAL LOW (ref 150–400)
RBC: 4.25 MIL/uL (ref 3.87–5.11)
RBC: 3.84 MIL/uL — ABNORMAL LOW (ref 3.87–5.11)
RDW: 13 % (ref 11.5–15.5)
RDW: 12.9 % (ref 11.5–15.5)
WBC: 7.5 10*3/uL (ref 4.0–10.5)
WBC: 6.9 10*3/uL (ref 4.0–10.5)
nRBC: 0 % (ref 0.0–0.2)
nRBC: 0 % (ref 0.0–0.2)

## 2020-04-03 LAB — SARS CORONAVIRUS 2 BY RT PCR (HOSPITAL ORDER, PERFORMED IN ~~LOC~~ HOSPITAL LAB): SARS Coronavirus 2: NEGATIVE

## 2020-04-03 LAB — LIPASE, BLOOD: Lipase: 679 U/L — ABNORMAL HIGH (ref 11–51)

## 2020-04-03 LAB — ETHANOL: Alcohol, Ethyl (B): <10 mg/dL (ref <10)

## 2020-04-03 MED ORDER — OXYCODONE-ACETAMINOPHEN 7.5-325 MG PO TABS
1.0000 | ORAL_TABLET | ORAL | Status: DC | PRN
  Administered 2020-04-03 – 2020-04-04 (×5): 1 via ORAL
  Filled 2020-04-03 (×5): qty 1

## 2020-04-03 MED ORDER — SODIUM CHLORIDE 0.9 % IV BOLUS
1000.0000 mL | Freq: Once | INTRAVENOUS | Status: AC
  Administered 2020-04-03: 1000 mL via INTRAVENOUS

## 2020-04-03 MED ORDER — ADULT MULTIVITAMIN W/MINERALS CH
1.0000 | ORAL_TABLET | Freq: Every day | ORAL | Status: DC
  Administered 2020-04-04 – 2020-04-05 (×2): 1 via ORAL
  Filled 2020-04-03 (×3): qty 1

## 2020-04-03 MED ORDER — POTASSIUM CHLORIDE 10 MEQ/100ML IV SOLN
10.0000 meq | INTRAVENOUS | Status: AC
  Administered 2020-04-03 – 2020-04-04 (×3): 10 meq via INTRAVENOUS
  Filled 2020-04-03 (×4): qty 100

## 2020-04-03 MED ORDER — THIAMINE HCL 100 MG PO TABS
100.0000 mg | ORAL_TABLET | Freq: Every day | ORAL | Status: DC
  Administered 2020-04-03 – 2020-04-05 (×3): 100 mg via ORAL
  Filled 2020-04-03 (×3): qty 1

## 2020-04-03 MED ORDER — FOLIC ACID 1 MG PO TABS
1.0000 mg | ORAL_TABLET | Freq: Every day | ORAL | Status: DC
  Administered 2020-04-03 – 2020-04-05 (×3): 1 mg via ORAL
  Filled 2020-04-03 (×3): qty 1

## 2020-04-03 MED ORDER — ONDANSETRON HCL 4 MG/2ML IJ SOLN
4.0000 mg | Freq: Four times a day (QID) | INTRAMUSCULAR | Status: DC | PRN
  Administered 2020-04-03 – 2020-04-04 (×4): 4 mg via INTRAVENOUS
  Filled 2020-04-03 (×4): qty 2

## 2020-04-03 MED ORDER — SUCRALFATE 1 G PO TABS
1.0000 g | ORAL_TABLET | Freq: Three times a day (TID) | ORAL | Status: DC
  Administered 2020-04-03 – 2020-04-05 (×5): 1 g via ORAL
  Filled 2020-04-03 (×6): qty 1

## 2020-04-03 MED ORDER — MORPHINE SULFATE (PF) 2 MG/ML IV SOLN
2.0000 mg | INTRAVENOUS | Status: DC | PRN
  Administered 2020-04-03 – 2020-04-04 (×8): 2 mg via INTRAVENOUS
  Filled 2020-04-03 (×9): qty 1

## 2020-04-03 MED ORDER — NICOTINE 21 MG/24HR TD PT24
21.0000 mg | MEDICATED_PATCH | Freq: Every day | TRANSDERMAL | Status: DC
  Filled 2020-04-03 (×2): qty 1

## 2020-04-03 MED ORDER — MORPHINE SULFATE (PF) 4 MG/ML IV SOLN
4.0000 mg | INTRAVENOUS | Status: DC | PRN
  Administered 2020-04-03: 4 mg via INTRAVENOUS
  Filled 2020-04-03: qty 1

## 2020-04-03 MED ORDER — ALPRAZOLAM 0.25 MG PO TABS: 0.2500 mg | ORAL_TABLET | Freq: Two times a day (BID) | ORAL | Status: DC | PRN

## 2020-04-03 MED ORDER — POTASSIUM CHLORIDE 10 MEQ/100ML IV SOLN
10.0000 meq | INTRAVENOUS | Status: AC
  Administered 2020-04-03: 10 meq via INTRAVENOUS
  Filled 2020-04-03: qty 100

## 2020-04-03 MED ORDER — ACETAMINOPHEN 325 MG PO TABS: 650.0000 mg | ORAL_TABLET | Freq: Four times a day (QID) | ORAL | Status: DC | PRN

## 2020-04-03 MED ORDER — LISINOPRIL 20 MG PO TABS
20.0000 mg | ORAL_TABLET | Freq: Every day | ORAL | Status: DC
  Administered 2020-04-04: 20 mg via ORAL
  Filled 2020-04-03: qty 1

## 2020-04-03 MED ORDER — PANTOPRAZOLE SODIUM 20 MG PO TBEC
20.0000 mg | DELAYED_RELEASE_TABLET | Freq: Every day | ORAL | Status: DC
  Administered 2020-04-03 – 2020-04-05 (×2): 20 mg via ORAL
  Filled 2020-04-03 (×3): qty 1

## 2020-04-03 MED ORDER — ENOXAPARIN SODIUM 40 MG/0.4ML ~~LOC~~ SOLN
40.0000 mg | SUBCUTANEOUS | Status: DC
  Administered 2020-04-04: 40 mg via SUBCUTANEOUS
  Filled 2020-04-03: qty 0.4

## 2020-04-03 MED ORDER — AMLODIPINE BESYLATE 5 MG PO TABS
5.0000 mg | ORAL_TABLET | Freq: Every day | ORAL | Status: DC
  Administered 2020-04-03 – 2020-04-04 (×2): 5 mg via ORAL
  Filled 2020-04-03 (×2): qty 1

## 2020-04-03 MED ORDER — LACTATED RINGERS IV SOLN: INTRAVENOUS | Status: DC

## 2020-04-03 MED ORDER — POLYETHYLENE GLYCOL 3350 17 G PO PACK: 17.0000 g | PACK | Freq: Every day | ORAL | Status: DC | PRN

## 2020-04-03 NOTE — ED Notes (Signed)
Informed Rebekah RN of bed assignment

## 2020-04-03 NOTE — ED Triage Notes (Addendum)
Pt comes via EMS with c/o right sided abdominal pain. Pt states this started a couple days ago. Pt states vomiting and severe pain. Pt states it just keeps getting worse.  Pt hunched over and is tearful. Pt states hx of pancreatitis.

## 2020-04-03 NOTE — ED Provider Notes (Signed)
[facility] Emergency Department Provider Note   ____________________________________________   First MD Initiated Contact with Patient 04/03/20 1213     (approximate)  I have reviewed the triage vital signs and the nursing notes.   HISTORY  Chief Complaint Abdominal Pain    HPI Sarah Sosa is a 39 y.o. female with a stated past medical history of cholecystectomy and chronic pancreatitis as well as persistent alcohol use who presents for midepigastric abdominal pain that began 2 days ago and is described as 10/10 burning and nonradiating.  Patient denies any exacerbating or relieving factors.  Patient states that she has been p.o. intolerant over the last 24 hours.  Patient denies using any medications for this pain.  Patient denies any exacerbating or relieving factors.  Patient denies any coffee-ground emesis, dark tarry stools, or bright red blood per rectum.         Past Medical History:  Diagnosis Date  . Arthritis   . Asthma   . Chronic pain disorder 03/19/2018  . GERD (gastroesophageal reflux disease)   . Headache   . Heart murmur   . History of trichomoniasis 03/19/2018  . Hypertension   . Pancreatitis   . PID (pelvic inflammatory disease) 02/14/2018  . Uterine leiomyoma 03/19/2018    Patient Active Problem List   Diagnosis Date Noted  . Acute pancreatitis 12/09/2019  . Ileus ([place]) 04/21/2018  . Postoperative ileus ([place]) 04/20/2018  . Postoperative state 04/15/2018  . Uterine leiomyoma 03/19/2018  . Menorrhagia with regular cycle 03/19/2018  . Chronic pain disorder 03/19/2018  . Pelvic pain 03/19/2018  . Essential hypertension 03/19/2018    Past Surgical History:  Procedure Laterality Date  . ABDOMINAL HYSTERECTOMY    . CHOLECYSTECTOMY    . DENTAL SURGERY    . HYSTERECTOMY ABDOMINAL WITH SALPINGECTOMY Bilateral 04/15/2018   Procedure: HYSTERECTOMY ABDOMINAL WITH BILATERAL SALPINGECTOMY;  Surgeon: Rubie Maid, MD;  Location: [facility]  ORS;  Service: Gynecology;  Laterality: Bilateral;  . kenn surgery Bilateral   . KNEE ARTHROSCOPY Left 2012, 2013    Prior to Admission medications   Medication Sig Start Date End Date Taking? Authorizing Provider  acetaminophen (TYLENOL) 325 MG tablet Take 2 tablets (650 mg total) by mouth every 6 (six) hours as needed for mild pain (or Fever >/= 101). 12/11/19   Nolberto Hanlon, MD  ALPRAZolam Duanne Moron) 0.25 MG tablet Take 0.25 mg by mouth 2 (two) times daily as needed.     [provider]  amLODipine (NORVASC) 5 MG tablet Take 1 tablet (5 mg total) by mouth daily. 12/11/19 12/10/20  Nolberto Hanlon, MD  cephALEXin (KEFLEX) 500 MG capsule Take 1 capsule (500 mg total) by mouth 3 (three) times daily. 03/23/20   Paulette Blanch, MD  diclofenac Sodium (VOLTAREN) 1 % GEL SMARTSIG:5 Gram(s) Topical 4 Times Daily 11/29/19   [provider]  dicyclomine (BENTYL) 10 MG capsule Take 1 capsule (10 mg total) by mouth 4 (four) times daily for 14 days. 01/11/20 01/25/20  Vanessa Leon, MD  diphenhydramine-acetaminophen (TYLENOL PM) 25-500 MG TABS tablet Take 2 tablets by mouth at bedtime as needed (sleep).    [provider]  lisinopril (ZESTRIL) 20 MG tablet Take 1 tablet (20 mg total) by mouth daily. 12/12/19   Nolberto Hanlon, MD  ondansetron (ZOFRAN ODT) 4 MG disintegrating tablet Take 1 tablet (4 mg total) by mouth every 8 (eight) hours as needed for nausea or vomiting. 03/23/20   Paulette Blanch, MD  oxyCODONE-acetaminophen [facility])  7.5-325 MG tablet Take 1 tablet by mouth 4 (four) times daily.     [provider]  pantoprazole (PROTONIX) 20 MG tablet Take 1 tablet (20 mg total) by mouth daily. 01/11/20 02/10/20  Vanessa Raynham, MD  sucralfate (CARAFATE) 1 g tablet Take 1 tablet (1 g total) by mouth 4 (four) times daily -  with meals and at bedtime. 01/11/20 02/10/20  Vanessa [place], MD    Allergies Latex, Nsaids, Tomato, and Tramadol  Family History  Problem Relation Age of Onset  .  Fibroids Mother   . Hypertension Mother   . Arthritis Mother     Social History Social History   Tobacco Use  . Smoking status: Current Some Day Smoker    Packs/day: 0.50    Years: 22.00    Pack years: 11.00    Types: Cigarettes  . Smokeless tobacco: Never Used  Vaping Use  . Vaping Use: Never used  Substance Use Topics  . Alcohol use: Yes    Comment: occasional  . Drug use: Yes    Types: Marijuana    Review of Systems Constitutional: No fever/chills Eyes: No visual changes. ENT: No sore throat. Cardiovascular: Denies chest pain. Respiratory: Denies shortness of breath. Gastrointestinal: Endorses abdominal pain, nausea, and vomiting.  No diarrhea. Genitourinary: Negative for dysuria. Musculoskeletal: Negative for acute arthralgias Skin: Negative for rash. Neurological: Negative for headaches, weakness/numbness/paresthesias in any extremity Psychiatric: Negative for suicidal ideation/homicidal ideation   ____________________________________________   PHYSICAL EXAM:  VITAL SIGNS: ED Triage Vitals  Enc Vitals Group     BP 04/03/20 1055 (!) 193/111     Pulse Rate 04/03/20 1055 80     Resp 04/03/20 1055 18     Temp 04/03/20 1055 98.6 F (37 C)     Temp src --      SpO2 04/03/20 1055 98 %     Weight 04/03/20 1054 212 lb (96.2 kg)     Height 04/03/20 1054 5\' 6"  (1.676 m)     Head Circumference --      Peak Flow --      Pain Score 04/03/20 1054 10     Pain Loc --      Pain Edu? --      Excl. in [place]? --    Constitutional: Alert and oriented. Well appearing and in no acute distress. Eyes: Conjunctivae are normal. PERRL. EOMI. Head: Atraumatic. Nose: No congestion/rhinnorhea. Mouth/Throat: Mucous membranes are moist. Neck: No stridor Cardiovascular: Normal rate, regular rhythm. Grossly normal heart sounds.  Good peripheral circulation. Respiratory: Normal respiratory effort.  No retractions. Gastrointestinal: Significant midepigastric tenderness to palpation  with guarding. No distention. Musculoskeletal: No lower extremity tenderness nor edema.  No joint effusions. Neurologic:  Normal speech and language. No gross focal neurologic deficits are appreciated. Skin:  Skin is warm and dry. No rash noted. Psychiatric: Mood and affect are normal. Speech and behavior are normal.  ____________________________________________   LABS (all labs ordered are listed, but only abnormal results are displayed)  Labs Reviewed  LIPASE, BLOOD - Abnormal; Notable for the following components:      Result Value   Lipase 679 (*)    All other components within normal limits  COMPREHENSIVE METABOLIC PANEL - Abnormal; Notable for the following components:   Potassium 3.2 (*)    Glucose, Bld 119 (*)    All other components within normal limits  SARS CORONAVIRUS 2 BY RT PCR (HOSPITAL ORDER, [facility])  CBC  URINALYSIS, COMPLETE (UACMP) WITH MICROSCOPIC  ETHANOL  URINE DRUG SCREEN, QUALITATIVE ([facility] ONLY)  POC URINE PREG, ED   _______________________________________   PROCEDURES  Procedure(s) performed (including Critical Care):  Procedures   ____________________________________________   INITIAL IMPRESSION / ASSESSMENT AND PLAN / ED COURSE        Patient presents with midepigastric abdominal pain with associated nausea/vomiting. Given history and exam I have a low suspicion for AAA, SBO, appendicitis, mesenteric ischemia, nephrolithiasis, pyelonephritis, or diverticulitis. I have a moderate concern for pancreatitis, gastritis vs non-bleeding peptic ulcer, or hepatobiliary disease and thus will obtain labs and imaging.  ED Workup: CBC, BMP, LFTs, Lipase  No signs of severe pancreatitis on exam such as ecchymosis of periumbilical region or flanks, hypoxia, or tachypneia.  Disposition: Admit for bowel rest, continued analgesia, monitoring.      ____________________________________________   FINAL CLINICAL  IMPRESSION(S) / ED DIAGNOSES  Final diagnoses:  Acute pancreatitis, unspecified complication status, unspecified pancreatitis type  Nausea and vomiting, intractability of vomiting not specified, unspecified vomiting type  Abdominal pain, epigastric     ED Discharge Orders    None       Note:  This document was prepared using Dragon voice recognition software and may include unintentional dictation errors.   Naaman Plummer, MD 04/03/20 1409

## 2020-04-03 NOTE — ED Notes (Signed)
[facility] to ED with patient who has history of pancreatitis and is concerned that her appendix has burst. Patient with nausea and vomiting. Alert and oriented.

## 2020-04-03 NOTE — ED Notes (Signed)
Lab called to collect blood 

## 2020-04-03 NOTE — H&P (Addendum)
History and Physical    Sarah Sosa YCX:[id number] DOB: May 14, 1981 DOA: 04/03/2020  PCP: Pcp, No  Patient coming from: home   Chief Complaint: abdominal pain  HPI: Sarah Sosa is a 39 y.o. female with medical history significant of 51 a39 y.o.African-American femalewith a known history of asthma, GERD, hypertension , drug abuse and pancreatitis presents complaining of epigastric pain that started yesterday.  She was at a party 2 days ago and had marijuana and several shots of alcohol.  Subsequently she began having epigastric pain.  She reports nausea and vomiting several times overnight.  She admits to diarrhea with loose stool.  No fever, chills, chest pain, dizziness or any other symptoms.  She is requesting pain medication.  Covid testing pending.  Did not get her vaccine.  Off note patient has presented to the ED for the same thing in the past. She has visited different ER's including [facility] every month for different complaints/pain.  ED Course: Blood pressure 193/111, pulse 80 afebrile respiratory rate 18.  Potassium 3.2, lipase 679 otherwise other blood work is unremarkable.  Glucose is 119.  Review of Systems: All systems reviewed and otherwise negative.    Past Medical History:  Diagnosis Date  . Arthritis   . Asthma   . Chronic pain disorder 03/19/2018  . GERD (gastroesophageal reflux disease)   . Headache   . Heart murmur   . History of trichomoniasis 03/19/2018  . Hypertension   . Pancreatitis   . PID (pelvic inflammatory disease) 02/14/2018  . Uterine leiomyoma 03/19/2018    Past Surgical History:  Procedure Laterality Date  . ABDOMINAL HYSTERECTOMY    . CHOLECYSTECTOMY    . DENTAL SURGERY    . HYSTERECTOMY ABDOMINAL WITH SALPINGECTOMY Bilateral 04/15/2018   Procedure: HYSTERECTOMY ABDOMINAL WITH BILATERAL SALPINGECTOMY;  Surgeon: Rubie Maid, MD;  Location: [facility] ORS;  Service: Gynecology;  Laterality: Bilateral;  . kenn surgery Bilateral   . KNEE  ARTHROSCOPY Left 2012, 2013     reports that she has been smoking cigarettes. She has a 11.00 pack-year smoking history. She has never used smokeless tobacco. She reports current alcohol use. She reports current drug use. Drug: Marijuana.  Allergies  Allergen Reactions  . Latex Anaphylaxis, Hives and Itching  . Nsaids Other (See Comments)    Serve ulcers; stomach aches, GERD.   . Tomato Anaphylaxis, Hives and Itching  . Tramadol Itching    Family History  Problem Relation Age of Onset  . Fibroids Mother   . Hypertension Mother   . Arthritis Mother      Prior to Admission medications   Medication Sig Start Date End Date Taking? Authorizing Provider  acetaminophen (TYLENOL) 325 MG tablet Take 2 tablets (650 mg total) by mouth every 6 (six) hours as needed for mild pain (or Fever >/= 101). 12/11/19   Nolberto Hanlon, MD  ALPRAZolam Duanne Moron) 0.25 MG tablet Take 0.25 mg by mouth 2 (two) times daily as needed.     [provider]  amLODipine (NORVASC) 5 MG tablet Take 1 tablet (5 mg total) by mouth daily. 12/11/19 12/10/20  Nolberto Hanlon, MD  diclofenac Sodium (VOLTAREN) 1 % GEL SMARTSIG:5 Gram(s) Topical 4 Times Daily 11/29/19   [provider]  dicyclomine (BENTYL) 10 MG capsule Take 1 capsule (10 mg total) by mouth 4 (four) times daily for 14 days. 01/11/20 01/25/20  Vanessa [place], MD  diphenhydramine-acetaminophen (TYLENOL PM) 25-500 MG TABS tablet Take 2 tablets by mouth at bedtime as needed (sleep).  [provider]  lisinopril (ZESTRIL) 20 MG tablet Take 1 tablet (20 mg total) by mouth daily. 12/12/19   Nolberto Hanlon, MD  ondansetron (ZOFRAN ODT) 4 MG disintegrating tablet Take 1 tablet (4 mg total) by mouth every 8 (eight) hours as needed for nausea or vomiting. 03/23/20   Paulette Blanch, MD  oxyCODONE-acetaminophen (PERCOCET) 7.5-325 MG tablet Take 1 tablet by mouth 4 (four) times daily.     [provider]  pantoprazole (PROTONIX) 20 MG tablet Take 1 tablet  (20 mg total) by mouth daily. 01/11/20 02/10/20  Vanessa [place], MD  sucralfate (CARAFATE) 1 g tablet Take 1 tablet (1 g total) by mouth 4 (four) times daily -  with meals and at bedtime. 01/11/20 02/10/20  Vanessa [place], MD    Physical Exam: Vitals:   04/03/20 1054 04/03/20 1055  BP:  (!) 193/111  Pulse:  80  Resp:  18  Temp:  98.6 F (37 C)  SpO2:  98%  Weight: 96.2 kg   Height: 5\' 6"  (1.676 m)     Constitutional: NAD, calm, comfortable Vitals:   04/03/20 1054 04/03/20 1055  BP:  (!) 193/111  Pulse:  80  Resp:  18  Temp:  98.6 F (37 C)  SpO2:  98%  Weight: 96.2 kg   Height: 5\' 6"  (1.676 m)    Eyes: PERRL, lids and conjunctivae normal ENMT: mask on  Neck: normal, supple Respiratory: clear to auscultation bilaterally, no wheezing, no crackles. Normal respiratory effort. No accessory muscle use.  Cardiovascular: Regular rate and rhythm, 2/6 SM heard best RU / rubs / gallops. No extremity edema. 2+ pedal pulses. No carotid bruits.  Abdomen: TTP at epigastric. Soft, ND,bowel sounds positive.  Musculoskeletal: no clubbing / cyanosis. No joint deformity upper and lower extremities.  no contractures. Normal muscle tone.  Skin: no rashes, lesions, ulcers.  Neurologic: CN 2-12 grossly intact.   Psychiatric: Normal judgment and insight. Alert and oriented x 3. Normal mood.    Labs on Admission: I have personally reviewed following labs and imaging studies  CBC: Recent Labs  Lab 04/03/20 1130  WBC 7.5  HGB 13.9  HCT 40.9  MCV 96.2  PLT 784   Basic Metabolic Panel: Recent Labs  Lab 04/03/20 1130  NA 135  K 3.2*  CL 101  CO2 22  GLUCOSE 119*  BUN 10  CREATININE 0.76  CALCIUM 9.2   GFR: Estimated Creatinine Clearance: 111.5 mL/min (by C-G formula based on SCr of 0.76 mg/dL). Liver Function Tests: Recent Labs  Lab 04/03/20 1130  AST 27  ALT 11  ALKPHOS 58  BILITOT 1.0  PROT 8.0  ALBUMIN 4.3   Recent Labs  Lab 04/03/20 1130  LIPASE 679*   No results  for input(s): AMMONIA in the last 168 hours. Coagulation Profile: No results for input(s): INR, PROTIME in the last 168 hours. Cardiac Enzymes: No results for input(s): CKTOTAL, CKMB, CKMBINDEX, TROPONINI in the last 168 hours. BNP (last 3 results) No results for input(s): PROBNP in the last 8760 hours. HbA1C: No results for input(s): HGBA1C in the last 72 hours. CBG: No results for input(s): GLUCAP in the last 168 hours. Lipid Profile: No results for input(s): CHOL, HDL, LDLCALC, TRIG, CHOLHDL, LDLDIRECT in the last 72 hours. Thyroid Function Tests: No results for input(s): TSH, T4TOTAL, FREET4, T3FREE, THYROIDAB in the last 72 hours. Anemia Panel: No results for input(s): VITAMINB12, FOLATE, FERRITIN, TIBC, IRON, RETICCTPCT in the last 72 hours. Urine analysis:  Component Value Date/Time   COLORURINE YELLOW (A) 03/22/2020 1930   APPEARANCEUR CLOUDY (A) 03/22/2020 1930   LABSPEC 1.017 03/22/2020 1930   PHURINE 6.0 03/22/2020 1930   GLUCOSEU NEGATIVE 03/22/2020 1930   HGBUR SMALL (A) 03/22/2020 1930   BILIRUBINUR NEGATIVE 03/22/2020 1930   KETONESUR NEGATIVE 03/22/2020 1930   PROTEINUR 30 (A) 03/22/2020 1930   NITRITE POSITIVE (A) 03/22/2020 1930   LEUKOCYTESUR LARGE (A) 03/22/2020 1930    Radiological Exams on Admission: No results found.    Assessment/Plan Active Problems:   Acute pancreatitis   Hypertensive urgency   Hypokalemia   Tobacco abuse   History of substance abuse ([place])   1.  Acute pancreatitis-likely due to alcohol use Counseled patient extensively about abstaining from all alcohol Ethanol level ordered pending Lipase elevated at 679 Will start on IV fluid for aggressive hydration Antiemetics and pain management We will keep n.p.o. for now  2.  Hypokalemia-mild.  Potassium is 3.2 on admission Will supplement with KCl 40 mEq IV x1 Monitor level  3.  Hypertensive urgency-unsure if patient took her blood pressure medication today.  She has been  vomiting.  Also with pain loss BP likely will be elevated. Will resume her home amlodipine and lisinopril   4.  Tobacco abuse-counseled patient about smoking cessation We will start her on nicotine patch while here  5.Hx/o substance abouse-  Caution with giving pain medications Will ck toxicology.  She denies using coacain   DVT prophylaxis: Lovenox Code Status: Full code Family Communication: None at bedside Disposition Plan: Home Consults called: None Admission status: Observation as patient will require less than 2 midnight stays   Nolberto Hanlon MD [facility] Pager [phone]   If 7PM-7AM, please contact night-coverage [email or web address] Password [email or web address]  04/03/2020, 2:44 PM

## 2020-04-04 ENCOUNTER — Encounter: Payer: Self-pay | Admitting: Internal Medicine

## 2020-04-04 DIAGNOSIS — Z20822 Contact with and (suspected) exposure to covid-19: Secondary | ICD-10-CM | POA: Diagnosis present

## 2020-04-04 DIAGNOSIS — J45909 Unspecified asthma, uncomplicated: Secondary | ICD-10-CM | POA: Diagnosis present

## 2020-04-04 DIAGNOSIS — F1911 Other psychoactive substance abuse, in remission: Secondary | ICD-10-CM | POA: Diagnosis not present

## 2020-04-04 DIAGNOSIS — I160 Hypertensive urgency: Secondary | ICD-10-CM | POA: Diagnosis present

## 2020-04-04 DIAGNOSIS — R109 Unspecified abdominal pain: Secondary | ICD-10-CM | POA: Diagnosis present

## 2020-04-04 DIAGNOSIS — Z9049 Acquired absence of other specified parts of digestive tract: Secondary | ICD-10-CM | POA: Diagnosis not present

## 2020-04-04 DIAGNOSIS — Z79899 Other long term (current) drug therapy: Secondary | ICD-10-CM | POA: Diagnosis not present

## 2020-04-04 DIAGNOSIS — K852 Alcohol induced acute pancreatitis without necrosis or infection: Secondary | ICD-10-CM | POA: Diagnosis not present

## 2020-04-04 DIAGNOSIS — I10 Essential (primary) hypertension: Secondary | ICD-10-CM | POA: Diagnosis present

## 2020-04-04 DIAGNOSIS — Z8261 Family history of arthritis: Secondary | ICD-10-CM | POA: Diagnosis not present

## 2020-04-04 DIAGNOSIS — G8929 Other chronic pain: Secondary | ICD-10-CM | POA: Diagnosis present

## 2020-04-04 DIAGNOSIS — M1990 Unspecified osteoarthritis, unspecified site: Secondary | ICD-10-CM | POA: Diagnosis present

## 2020-04-04 DIAGNOSIS — Z8249 Family history of ischemic heart disease and other diseases of the circulatory system: Secondary | ICD-10-CM | POA: Diagnosis not present

## 2020-04-04 DIAGNOSIS — F17210 Nicotine dependence, cigarettes, uncomplicated: Secondary | ICD-10-CM | POA: Diagnosis present

## 2020-04-04 DIAGNOSIS — R197 Diarrhea, unspecified: Secondary | ICD-10-CM | POA: Diagnosis present

## 2020-04-04 DIAGNOSIS — N739 Female pelvic inflammatory disease, unspecified: Secondary | ICD-10-CM | POA: Diagnosis present

## 2020-04-04 DIAGNOSIS — K861 Other chronic pancreatitis: Secondary | ICD-10-CM | POA: Diagnosis present

## 2020-04-04 DIAGNOSIS — Z90710 Acquired absence of both cervix and uterus: Secondary | ICD-10-CM | POA: Diagnosis not present

## 2020-04-04 DIAGNOSIS — R1310 Dysphagia, unspecified: Secondary | ICD-10-CM | POA: Diagnosis present

## 2020-04-04 DIAGNOSIS — E876 Hypokalemia: Secondary | ICD-10-CM | POA: Diagnosis present

## 2020-04-04 DIAGNOSIS — K859 Acute pancreatitis without necrosis or infection, unspecified: Secondary | ICD-10-CM | POA: Diagnosis not present

## 2020-04-04 DIAGNOSIS — K219 Gastro-esophageal reflux disease without esophagitis: Secondary | ICD-10-CM | POA: Diagnosis present

## 2020-04-04 LAB — URINALYSIS, COMPLETE (UACMP) WITH MICROSCOPIC
Bilirubin Urine: NEGATIVE
Glucose, UA: NEGATIVE mg/dL
Ketones, ur: 20 mg/dL — AB
Leukocytes,Ua: NEGATIVE
Nitrite: NEGATIVE
Protein, ur: NEGATIVE mg/dL
Specific Gravity, Urine: 1.009 (ref 1.005–1.030)
pH: 5 (ref 5.0–8.0)

## 2020-04-04 LAB — BASIC METABOLIC PANEL
Anion gap: 10 (ref 5–15)
BUN: 7 mg/dL (ref 6–20)
CO2: 24 mmol/L (ref 22–32)
Calcium: 8.7 mg/dL — ABNORMAL LOW (ref 8.9–10.3)
Chloride: 102 mmol/L (ref 98–111)
Creatinine, Ser: 0.71 mg/dL (ref 0.44–1.00)
GFR calc Af Amer: >60 mL/min (ref >60)
GFR calc non Af Amer: >60 mL/min (ref >60)
Glucose, Bld: 70 mg/dL (ref 70–99)
Potassium: 3.5 mmol/L (ref 3.5–5.1)
Sodium: 136 mmol/L (ref 135–145)

## 2020-04-04 LAB — URINE DRUG SCREEN, QUALITATIVE (ARMC ONLY)
Amphetamines, Ur Screen: NOT DETECTED
Barbiturates, Ur Screen: NOT DETECTED
Benzodiazepine, Ur Scrn: NOT DETECTED
Cannabinoid 50 Ng, Ur ~~LOC~~: POSITIVE — AB
Cocaine Metabolite,Ur ~~LOC~~: NOT DETECTED
MDMA (Ecstasy)Ur Screen: NOT DETECTED
Methadone Scn, Ur: NOT DETECTED
Opiate, Ur Screen: POSITIVE — AB
Phencyclidine (PCP) Ur S: NOT DETECTED
Tricyclic, Ur Screen: NOT DETECTED

## 2020-04-04 LAB — LIPASE, BLOOD: Lipase: 149 U/L — ABNORMAL HIGH (ref 11–51)

## 2020-04-04 MED ORDER — LISINOPRIL 20 MG PO TABS
20.0000 mg | ORAL_TABLET | Freq: Two times a day (BID) | ORAL | Status: DC
  Administered 2020-04-04 – 2020-04-05 (×2): 20 mg via ORAL
  Filled 2020-04-04 (×2): qty 1

## 2020-04-04 MED ORDER — MORPHINE SULFATE (PF) 2 MG/ML IV SOLN
2.0000 mg | INTRAVENOUS | Status: DC | PRN
  Administered 2020-04-04 – 2020-04-05 (×7): 2 mg via INTRAVENOUS
  Filled 2020-04-04 (×9): qty 1

## 2020-04-04 MED ORDER — AMLODIPINE BESYLATE 5 MG PO TABS
5.0000 mg | ORAL_TABLET | Freq: Once | ORAL | Status: AC
  Administered 2020-04-04: 5 mg via ORAL
  Filled 2020-04-04: qty 1

## 2020-04-04 MED ORDER — AMLODIPINE BESYLATE 10 MG PO TABS
10.0000 mg | ORAL_TABLET | Freq: Every day | ORAL | Status: DC
  Administered 2020-04-05: 10 mg via ORAL
  Filled 2020-04-04: qty 1

## 2020-04-04 MED ORDER — MORPHINE SULFATE (PF) 2 MG/ML IV SOLN: 2.0000 mg | INTRAVENOUS | Status: DC | PRN

## 2020-04-04 MED ORDER — HYDRALAZINE HCL 20 MG/ML IJ SOLN
10.0000 mg | Freq: Four times a day (QID) | INTRAMUSCULAR | Status: DC | PRN
  Administered 2020-04-04: 10 mg via INTRAVENOUS
  Filled 2020-04-04 (×2): qty 1

## 2020-04-04 NOTE — Progress Notes (Addendum)
PROGRESS NOTE    Sarah Sosa  YWV:[id number] DOB: 1980/10/29 DOA: 04/03/2020 PCP: Pcp, No    Brief Narrative:  Sarah Sosa is a 39 y.o. female with medical history significant of 48 a39 y.o.African-American femalewith a known history of asthma, GERD, hypertension, drug abuseand pancreatitis presents complaining of epigastric pain that started yesterday.  She was at a party 2 days ago and had marijuana and several shots of alcohol.  Subsequently she began having epigastric pain.  She reports nausea and vomiting several times overnight.  She admits to diarrhea with loose stool.  No fever, chills, chest pain, dizziness or any other symptoms.  She is requesting pain medication.  Covid testing pending.  Did not get her vaccine.  Off note patient has presented to the ED for the same thing in the past. She has visited different ER's including [facility] every month for different complaints/pain.  ED Course: Blood pressure 193/111, pulse 80 afebrile respiratory rate 18.  Potassium 3.2, lipase 679 otherwise other blood work is unremarkable.  Glucose is 119.    Consultants:     Procedures:   Antimicrobials:       Subjective: Patient states she would like Dilaudid instead of morphine because it works better for her pain.  Tolerated clears. No new complaints  Objective: Vitals:   04/03/20 2158 04/04/20 0202 04/04/20 0703 04/04/20 1249  BP: (!) 190/103 (!) 190/94 (!) 191/98 (!) 171/96  Pulse: 60 63 62 72  Resp: 20   16  Temp: 98.3 F (36.8 C) 98.1 F (36.7 C) 98.2 F (36.8 C) 98.5 F (36.9 C)  TempSrc: Oral Oral Oral Oral  SpO2: 99% 99% 100% 98%  Weight:      Height:        Intake/Output Summary (Last 24 hours) at 04/04/2020 1518 Last data filed at 04/04/2020 1335 Gross per 24 hour  Intake 2400 ml  Output --  Net 2400 ml   Filed Weights   04/03/20 1054  Weight: 96.2 kg    Examination: Appears, comfortable, NAD CTA, no wheeze rales rhonchi's RRR W5-I6  2 / 6 systolic murmur best heard on the right upper sternal Soft, nontender, nondistended, positive bowel sounds No edema cyanosis Mood and affect appropriate AA x O x3, grossly intact     Data Reviewed: I have personally reviewed following labs and imaging studies  CBC: Recent Labs  Lab 04/03/20 1130 04/03/20 1649  WBC 7.5 6.9  HGB 13.9 12.5  HCT 40.9 37.0  MCV 96.2 96.4  PLT 176 270*   Basic Metabolic Panel: Recent Labs  Lab 04/03/20 1130 04/04/20 0701  NA 135 136  K 3.2* 3.5  CL 101 102  CO2 22 24  GLUCOSE 119* 70  BUN 10 7  CREATININE 0.76 0.71  CALCIUM 9.2 8.7*   GFR: Estimated Creatinine Clearance: 111.5 mL/min (by C-G formula based on SCr of 0.71 mg/dL). Liver Function Tests: Recent Labs  Lab 04/03/20 1130  AST 27  ALT 11  ALKPHOS 58  BILITOT 1.0  PROT 8.0  ALBUMIN 4.3   Recent Labs  Lab 04/03/20 1130 04/04/20 0701  LIPASE 679* 149*   No results for input(s): AMMONIA in the last 168 hours. Coagulation Profile: No results for input(s): INR, PROTIME in the last 168 hours. Cardiac Enzymes: No results for input(s): CKTOTAL, CKMB, CKMBINDEX, TROPONINI in the last 168 hours. BNP (last 3 results) No results for input(s): PROBNP in the last 8760 hours. HbA1C: No results for input(s): HGBA1C in the last 72 hours.  CBG: No results for input(s): GLUCAP in the last 168 hours. Lipid Profile: No results for input(s): CHOL, HDL, LDLCALC, TRIG, CHOLHDL, LDLDIRECT in the last 72 hours. Thyroid Function Tests: No results for input(s): TSH, T4TOTAL, FREET4, T3FREE, THYROIDAB in the last 72 hours. Anemia Panel: No results for input(s): VITAMINB12, FOLATE, FERRITIN, TIBC, IRON, RETICCTPCT in the last 72 hours. Sepsis Labs: No results for input(s): PROCALCITON, LATICACIDVEN in the last 168 hours.  Recent Results (from the past 240 hour(s))  SARS Coronavirus 2 by RT PCR (hospital order, performed in [facility] hospital lab) Nasopharyngeal Nasopharyngeal  Swab     Status: None   Collection Time: 04/03/20  3:09 PM   Specimen: Nasopharyngeal Swab  Result Value Ref Range Status   SARS Coronavirus 2 NEGATIVE NEGATIVE Final    Comment: (NOTE) SARS-CoV-2 target nucleic acids are NOT DETECTED.  The SARS-CoV-2 RNA is generally detectable in upper and lower respiratory specimens during the acute phase of infection. The lowest concentration of SARS-CoV-2 viral copies this assay can detect is 250 copies / mL. A negative result does not preclude SARS-CoV-2 infection and should not be used as the sole basis for treatment or other patient management decisions.  A negative result may occur with improper specimen collection / handling, submission of specimen other than nasopharyngeal swab, presence of viral mutation(s) within the areas targeted by this assay, and inadequate number of viral copies (<250 copies / mL). A negative result must be combined with clinical observations, patient history, and epidemiological information.  Fact Sheet for Patients:   StrictlyIdeas.no  Fact Sheet for Healthcare Providers: BankingDealers.co.za  This test is not yet approved or  cleared by the [place] FDA and has been authorized for detection and/or diagnosis of SARS-CoV-2 by FDA under an Emergency Use Authorization (EUA).  This EUA will remain in effect (meaning this test can be used) for the duration of the COVID-19 declaration under Section 564(b)(1) of the Act, 21 U.S.C. section 360bbb-3(b)(1), unless the authorization is terminated or revoked sooner.  Performed at [facility], [address] [place], [place] [address]          Radiology Studies: No results found.      Scheduled Meds: . amLODipine  5 mg Oral Daily  . enoxaparin (LOVENOX) injection  40 mg Subcutaneous Q24H  . folic acid  1 mg Oral Daily  . lisinopril  20 mg Oral Daily  . multivitamin with minerals  1 tablet Oral  Daily  . nicotine  21 mg Transdermal Daily  . pantoprazole  20 mg Oral Daily  . sucralfate  1 g Oral TID WC & HS  . thiamine  100 mg Oral Daily   Continuous Infusions: . lactated ringers 125 mL/hr at 04/03/20 1708    Assessment & Plan:   Active Problems:   Acute pancreatitis   Hypertensive urgency   Hypokalemia   Tobacco abuse   History of substance abuse ([place])   1.  Acute pancreatitis-likely due to alcohol use Counseled patient extensively about abstaining from all alcohol Alcohol level less than 10 Lipase on admission was 679....>> 9 Still complaining of pain but asking for Dilaudid.  We will continue on morphine. Continue IV fluid for aggressive hydration Antiemetics Since tolerated clears will increase to soft diet    2.  Hypokalemia-mild.  Potassium is 3.2 on admission Supplemented and now stable at 3.5  We will continue to monitor periodically    3.  Hypertensive urgency-on admission unsure if patient took her  blood pressure medication prior to admission. She has been vomiting.   Still elevated.   Will increase lisinopril to 20 mg twice daily and increase amlodipine to 10 mg daily  Continue to monitor     4.  Tobacco abuse-counseled patient about smoking cessation Patient on nicotine patch here  5.Hx/o substance abouse-  Caution with giving pain medications Toxicology urine today was positive for cannabinoids and opiates, negative for cocaine (does have a history of using cocaine)   DVT prophylaxis: Lovenox Code Status: Full Family Communication: None at bedside  Status is: inpatient  Patient remains as inpatient as patient is requiring IV medications for severity of disease.  Dispo: The patient is from: Home              Anticipated d/c is to: Home              Anticipated d/c date is: 1 day              Patient currently is not medically stable to d/c.still with pain, requiring iv pain meds.            LOS: 0 days   Time spent: 35  minutes with more than 50% on [place], MD [facility] Pager [phone]  If 7PM-7AM, please contact night-coverage [email or web address] Password [facility] 04/04/2020, 3:18 PM

## 2020-04-04 NOTE — Plan of Care (Signed)
Continuing with plan of care. 

## 2020-04-05 ENCOUNTER — Inpatient Hospital Stay: Payer: Medicaid Other

## 2020-04-05 LAB — POTASSIUM: Potassium: 3.4 mmol/L — ABNORMAL LOW (ref 3.5–5.1)

## 2020-04-05 LAB — LIPASE, BLOOD: Lipase: 116 U/L — ABNORMAL HIGH (ref 11–51)

## 2020-04-05 MED ORDER — LISINOPRIL 20 MG PO TABS: 20.0000 mg | ORAL_TABLET | Freq: Two times a day (BID) | ORAL | 0 refills | Status: DC

## 2020-04-05 MED ORDER — POTASSIUM CHLORIDE CRYS ER 20 MEQ PO TBCR
40.0000 meq | EXTENDED_RELEASE_TABLET | Freq: Once | ORAL | Status: AC
  Administered 2020-04-05: 40 meq via ORAL
  Filled 2020-04-05: qty 2

## 2020-04-05 MED ORDER — AMLODIPINE BESYLATE 10 MG PO TABS: 5.0000 mg | ORAL_TABLET | Freq: Every day | ORAL | 0 refills | Status: DC

## 2020-04-05 MED ORDER — MORPHINE SULFATE (PF) 4 MG/ML IV SOLN
4.0000 mg | INTRAVENOUS | Status: DC | PRN
  Administered 2020-04-05 (×2): 4 mg via INTRAVENOUS
  Filled 2020-04-05 (×2): qty 1

## 2020-04-05 NOTE — Discharge Summary (Signed)
Sarah Sosa HUT:[id number] DOB: 1980/09/03 DOA: 04/03/2020  PCP: Pcp, No  Admit date: 04/03/2020 Discharge date: 04/05/2020  Admitted From: Home Disposition: Home  Recommendations for Outpatient Follow-up:  1. Follow up with PCP in 1 week 2. Please obtain BMP/CBC in one week       Discharge Condition:Stable CODE STATUS: Full Diet recommendation: Heart Healthy / Carb Modified / Regular / Dysphagia  Brief/Interim Summary: Sarah Sosa a 39 y.o.femalewith medical history with a known history of asthma, GERD, hypertension, drug abuseand pancreatitispresents complaining of epigastric . She was at a party 2 days ago and had marijuana and several shots of alcohol. Subsequently she began having epigastric pain. She reports nausea and vomiting several times overnight. She admits to diarrhea with loose stool.  She was admitted for acute pancreatitis since lipase was elevated.  She was started on IV fluids for hydration.  Patient was asking for Dilaudid instead of morphine for pain control. Covid test negative. Did not get her vaccine.  Off note patient has presented to the ED for the same thing in the past.She has visited different ER's including [facility] every month for different complaints/pain.  1.Acute pancreatitis-likely due to alcohol use Counseled patient extensively about abstaining from all alcohol Alcohol level less than 10 Lipase on admission was 679....>> 116 Had [employer] last night , her father brought in.  Stable for discharge  Was counseled to stop drinking all alcohol, fatty food, and stop smoking  2.Hypokalemia-mild. Potassium is 3.2 on admission Was supplemented Will need outpatient monitoring   3.Hypertensive urgency-on admission unsure if patient took her blood pressure medication prior to admission. Improved.  Needs to follow-up with primary care for further evaluation and management Will discharge with lisinopril and  amlodipine    4.Tobacco abuse-counseled patient about smoking cessation   5.Hx/o substance abouse-  Toxicology urine was positive for cannabinoids and opiates, negative for cocaine (does have a history of using cocaine)   Discharge Diagnoses:  Active Problems:   Acute pancreatitis   Hypertensive urgency   Hypokalemia   Tobacco abuse   History of substance abuse [facility])    Discharge Instructions  Discharge Instructions    Call MD for:  temperature >100.4   Complete by: As directed    Diet - low sodium heart healthy   Complete by: As directed    Discharge instructions   Complete by: As directed    Stop smoking Avoid drinking alcohol Follow-up with your primary care next week Avoid greasy food   Increase activity slowly   Complete by: As directed      Allergies as of 04/05/2020      Reactions   Latex Anaphylaxis, Hives, Itching   Nsaids Other (See Comments)   Serve ulcers; stomach aches, GERD.    Tomato Anaphylaxis, Hives, Itching   Tramadol Itching      Medication List    STOP taking these medications   cyclobenzaprine 10 MG tablet Commonly known as: FLEXERIL   diphenhydramine-acetaminophen 25-500 MG Tabs tablet Commonly known as: TYLENOL PM   propranolol 20 MG tablet Commonly known as: INDERAL     TAKE these medications   acetaminophen 325 MG tablet Commonly known as: TYLENOL Take 2 tablets (650 mg total) by mouth every 6 (six) hours as needed for mild pain (or Fever >/= 101).   ALPRAZolam 0.25 MG tablet Commonly known as: XANAX Take 0.25 mg by mouth 2 (two) times daily as needed.   amLODipine 10 MG tablet Commonly known as: NORVASC Take 0.5 tablets (  5 mg total) by mouth daily. What changed: medication strength   diclofenac Sodium 1 % Gel Commonly known as: VOLTAREN SMARTSIG:5 Gram(s) Topical 4 Times Daily   dicyclomine 10 MG capsule Commonly known as: Bentyl Take 1 capsule (10 mg total) by mouth 4 (four) times daily for 14 days.    lisinopril 20 MG tablet Commonly known as: ZESTRIL Take 1 tablet (20 mg total) by mouth 2 (two) times daily at 10 AM and 5 PM. What changed: when to take this   montelukast 10 MG tablet Commonly known as: SINGULAIR Take 10 mg by mouth daily.   naloxone 4 MG/0.1ML Liqd nasal spray kit Commonly known as: NARCAN Place 1 spray into the nose once.   ondansetron 4 MG disintegrating tablet Commonly known as: Zofran ODT Take 1 tablet (4 mg total) by mouth every 8 (eight) hours as needed for nausea or vomiting.   Oxycodone HCl 10 MG Tabs Take 10 mg by mouth every 6 (six) hours.   pantoprazole 20 MG tablet Commonly known as: Protonix Take 1 tablet (20 mg total) by mouth daily.   ProAir HFA 108 (90 Base) MCG/ACT inhaler Generic drug: albuterol Inhale 2 puffs into the lungs every 4 (four) hours as needed.   Symbicort 160-4.5 MCG/ACT inhaler Generic drug: budesonide-formoterol Inhale 2 puffs into the lungs 2 (two) times daily.       Allergies  Allergen Reactions  . Latex Anaphylaxis, Hives and Itching  . Nsaids Other (See Comments)    Serve ulcers; stomach aches, GERD.   . Tomato Anaphylaxis, Hives and Itching  . Tramadol Itching    Consultations:     Procedures/Studies: CT Abdomen Pelvis W Contrast  Result Date: 03/23/2020 CLINICAL DATA:  Left upper quadrant abdominal pain. Personal history of appendicitis. Hematemesis. EXAM: CT ABDOMEN AND PELVIS WITH CONTRAST TECHNIQUE: Multidetector CT imaging of the abdomen and pelvis was performed using the standard protocol following bolus administration of intravenous contrast. CONTRAST:  118mL OMNIPAQUE IOHEXOL 300 MG/ML  SOLN COMPARISON:  CT the abdomen pelvis 01/11/2020. FINDINGS: Lower chest: Lung bases are clear without focal nodule, mass, or airspace disease. The heart size is upper limits of normal. No significant pleural or pericardial effusion is present. Hepatobiliary: Subcentimeter cystic lesion laterally in the right lobe  on image 19 of series 2 is stable. No new lesions are present. Common bile duct is within normal limits following cholecystectomy. Pancreas: Unremarkable. No pancreatic ductal dilatation or surrounding inflammatory changes. Spleen: Normal in size without focal abnormality. Adrenals/Urinary Tract: Adrenal glands and kidneys are within normal limits bilaterally. Stone or mass lesion is present. Ureters are normal. The urinary bladder is within normal limits. Stomach/Bowel: The stomach and duodenum are within normal limits. Small bowel is unremarkable. Terminal ileum is within normal limits. The appendix is visualized and normal. The ascending and transverse colon are within normal limits. The descending and sigmoid colon are normal. Vascular/Lymphatic: Minimal atherosclerotic changes are noted in the aorta. No aneurysm is present. No significant retroperitoneal adenopathy is present. Reproductive: Status post hysterectomy. No adnexal masses. Other: No abdominal wall hernia or abnormality. No abdominopelvic ascites. Musculoskeletal: Degenerative changes are noted in the facet joints of the lower lumbar spine. Degenerative changes are noted in the lower thoracic spine. Vertebral body heights are maintained. Bony pelvis is intact. Hips are located and normal. No focal lytic or blastic lesions are present. Degenerative changes are noted at the SI joints bilaterally. IMPRESSION: 1. No acute or focal lesion to explain the patient's symptoms. 2. Cholecystectomy  and hysterectomy. 3. Aortic Atherosclerosis (ICD10-I70.0). Electronically Signed   By: San Morelle M.D.   On: 03/23/2020 07:55   US Venous Img Upper Uni Right(DVT)  Result Date: 04/05/2020 CLINICAL DATA:  Right upper extremity pain and edema. EXAM: RIGHT UPPER EXTREMITY VENOUS DOPPLER ULTRASOUND TECHNIQUE: Gray-scale sonography with graded compression, as well as color Doppler and duplex ultrasound were performed to evaluate the upper extremity deep  venous system from the level of the subclavian vein and including the jugular, axillary, basilic, radial, ulnar and upper cephalic vein. Spectral Doppler was utilized to evaluate flow at rest and with distal augmentation maneuvers. COMPARISON:  None. FINDINGS: Contralateral Subclavian Vein: Respiratory phasicity is normal and symmetric with the symptomatic side. No evidence of thrombus. Normal compressibility. Internal Jugular Vein: No evidence of thrombus. Normal compressibility, respiratory phasicity and response to augmentation. Subclavian Vein: No evidence of thrombus. Normal compressibility, respiratory phasicity and response to augmentation. Axillary Vein: No evidence of thrombus. Normal compressibility, respiratory phasicity and response to augmentation. Cephalic Vein: No evidence of thrombus. Normal compressibility, respiratory phasicity and response to augmentation. Basilic Vein: The main basilic vein is normally patent. There appears to be potentially segmental thrombus in a superficial branch of the basilic vein at roughly the mid forearm level. This thrombus is age indeterminate. Brachial Veins: No evidence of thrombus. Normal compressibility, respiratory phasicity and response to augmentation. Radial Veins: No evidence of thrombus. Normal compressibility, respiratory phasicity and response to augmentation. Ulnar Veins: No evidence of thrombus. Normal compressibility, respiratory phasicity and response to augmentation. Venous Reflux:  None visualized. Other Findings:  No abnormal fluid collections identified. IMPRESSION: 1. No evidence of DVT within the right upper extremity. 2. Focal thrombus in a branch of the basilic vein at the mid forearm level of the right arm. Findings are consistent with superficial thrombophlebitis. Appearance of the thrombus is age indeterminate. Correlation suggested with any recent phlebotomy or IV access in this region. Electronically Signed   By: Aletta Edouard M.D.   On:  04/05/2020 10:29       Subjective: Has no new complaints. No vomiting or nausea. Ate [employer] last night despite being told previously not to eat greasy food.  Discharge Exam: Vitals:   04/04/20 1929 04/05/20 0410  BP: (!) 156/96 (!) 154/91  Pulse: 83 71  Resp:  18  Temp: 99 F (37.2 C) 99 F (37.2 C)  SpO2: 99% 100%   Vitals:   04/04/20 0703 04/04/20 1249 04/04/20 1929 04/05/20 0410  BP: (!) 191/98 (!) 171/96 (!) 156/96 (!) 154/91  Pulse: 62 72 83 71  Resp:  16  18  Temp: 98.2 F (36.8 C) 98.5 F (36.9 C) 99 F (37.2 C) 99 F (37.2 C)  TempSrc: Oral Oral Oral Oral  SpO2: 100% 98% 99% 100%  Weight:      Height:        General: Pt is alert, awake, not in acute distress Cardiovascular: RRR, S1/S2 +, no rubs, no gallops Respiratory: CTA bilaterally, no wheezing, no rhonchi Abdominal: Soft, NT, ND, bowel sounds + Extremities: no edema, no cyanosis    The results of significant diagnostics from this hospitalization (including imaging, microbiology, ancillary and laboratory) are listed below for reference.     Microbiology: Recent Results (from the past 240 hour(s))  SARS Coronavirus 2 by RT PCR (hospital order, performed in [facility] hospital lab) Nasopharyngeal Nasopharyngeal Swab     Status: None   Collection Time: 04/03/20  3:09 PM   Specimen: Nasopharyngeal Swab  Result Value Ref Range Status   SARS Coronavirus 2 NEGATIVE NEGATIVE Final    Comment: (NOTE) SARS-CoV-2 target nucleic acids are NOT DETECTED.  The SARS-CoV-2 RNA is generally detectable in upper and lower respiratory specimens during the acute phase of infection. The lowest concentration of SARS-CoV-2 viral copies this assay can detect is 250 copies / mL. A negative result does not preclude SARS-CoV-2 infection and should not be used as the sole basis for treatment or other patient management decisions.  A negative result may occur with improper specimen collection / handling, submission of  specimen other than nasopharyngeal swab, presence of viral mutation(s) within the areas targeted by this assay, and inadequate number of viral copies (<250 copies / mL). A negative result must be combined with clinical observations, patient history, and epidemiological information.  Fact Sheet for Patients:   StrictlyIdeas.no  Fact Sheet for Healthcare Providers: BankingDealers.co.za  This test is not yet approved or  cleared by the [place] FDA and has been authorized for detection and/or diagnosis of SARS-CoV-2 by FDA under an Emergency Use Authorization (EUA).  This EUA will remain in effect (meaning this test can be used) for the duration of the COVID-19 declaration under Section 564(b)(1) of the Act, 21 U.S.C. section 360bbb-3(b)(1), unless the authorization is terminated or revoked sooner.  Performed at [facility], [address] [place], [place] [address]      Labs: BNP (last 3 results) No results for input(s): BNP in the last 8760 hours. Basic Metabolic Panel: Recent Labs  Lab 04/03/20 1130 04/04/20 0701 04/05/20 0520  NA 135 136  --   K 3.2* 3.5 3.4*  CL 101 102  --   CO2 22 24  --   GLUCOSE 119* 70  --   BUN 10 7  --   CREATININE 0.76 0.71  --   CALCIUM 9.2 8.7*  --    Liver Function Tests: Recent Labs  Lab 04/03/20 1130  AST 27  ALT 11  ALKPHOS 58  BILITOT 1.0  PROT 8.0  ALBUMIN 4.3   Recent Labs  Lab 04/03/20 1130 04/04/20 0701 04/05/20 0520  LIPASE 679* 149* 116*   No results for input(s): AMMONIA in the last 168 hours. CBC: Recent Labs  Lab 04/03/20 1130 04/03/20 1649  WBC 7.5 6.9  HGB 13.9 12.5  HCT 40.9 37.0  MCV 96.2 96.4  PLT 176 144*   Cardiac Enzymes: No results for input(s): CKTOTAL, CKMB, CKMBINDEX, TROPONINI in the last 168 hours. BNP: Invalid input(s): POCBNP CBG: No results for input(s): GLUCAP in the last 168 hours. D-Dimer No results for input(s):  DDIMER in the last 72 hours. Hgb A1c No results for input(s): HGBA1C in the last 72 hours. Lipid Profile No results for input(s): CHOL, HDL, LDLCALC, TRIG, CHOLHDL, LDLDIRECT in the last 72 hours. Thyroid function studies No results for input(s): TSH, T4TOTAL, T3FREE, THYROIDAB in the last 72 hours.  Invalid input(s): FREET3 Anemia work up No results for input(s): VITAMINB12, FOLATE, FERRITIN, TIBC, IRON, RETICCTPCT in the last 72 hours. Urinalysis    Component Value Date/Time   COLORURINE YELLOW (A) 04/04/2020 1030   APPEARANCEUR HAZY (A) 04/04/2020 1030   LABSPEC 1.009 04/04/2020 1030   PHURINE 5.0 04/04/2020 1030   GLUCOSEU NEGATIVE 04/04/2020 1030   HGBUR SMALL (A) 04/04/2020 1030   BILIRUBINUR NEGATIVE 04/04/2020 1030   KETONESUR 20 (A) 04/04/2020 1030   PROTEINUR NEGATIVE 04/04/2020 1030   NITRITE NEGATIVE 04/04/2020 [place] 04/04/2020 1030  Sepsis Labs Invalid input(s): PROCALCITONIN,  WBC,  LACTICIDVEN Microbiology Recent Results (from the past 240 hour(s))  SARS Coronavirus 2 by RT PCR (hospital order, performed in [facility] hospital lab) Nasopharyngeal Nasopharyngeal Swab     Status: None   Collection Time: 04/03/20  3:09 PM   Specimen: Nasopharyngeal Swab  Result Value Ref Range Status   SARS Coronavirus 2 NEGATIVE NEGATIVE Final    Comment: (NOTE) SARS-CoV-2 target nucleic acids are NOT DETECTED.  The SARS-CoV-2 RNA is generally detectable in upper and lower respiratory specimens during the acute phase of infection. The lowest concentration of SARS-CoV-2 viral copies this assay can detect is 250 copies / mL. A negative result does not preclude SARS-CoV-2 infection and should not be used as the sole basis for treatment or other patient management decisions.  A negative result may occur with improper specimen collection / handling, submission of specimen other than nasopharyngeal swab, presence of viral mutation(s) within the areas  targeted by this assay, and inadequate number of viral copies (<250 copies / mL). A negative result must be combined with clinical observations, patient history, and epidemiological information.  Fact Sheet for Patients:   StrictlyIdeas.no  Fact Sheet for Healthcare Providers: BankingDealers.co.za  This test is not yet approved or  cleared by the [place] FDA and has been authorized for detection and/or diagnosis of SARS-CoV-2 by FDA under an Emergency Use Authorization (EUA).  This EUA will remain in effect (meaning this test can be used) for the duration of the COVID-19 declaration under Section 564(b)(1) of the Act, 21 U.S.C. section 360bbb-3(b)(1), unless the authorization is terminated or revoked sooner.  Performed at [facility], [address], [place], [place] [address]      Time coordinating discharge: Over 30 minutes  SIGNED:   Nolberto Hanlon, MD  [facility] 04/05/2020, 11:57 AM Pager   If 7PM-7AM, please contact night-coverage [email or web address] Password [email or web address]

## 2020-04-05 NOTE — Progress Notes (Signed)
Pt refuses to have bed in lowest position. Says that getting out of the bed is uncomfortable otherwise. Pt bed is currently up very high

## 2020-04-05 NOTE — Progress Notes (Signed)
Sarah Sosa to be D/C'd home per MD order.  Discussed prescriptions and follow up appointments with the patient. Prescriptions given to patient, medication list explained in detail. Pt verbalized understanding.  Allergies as of 04/05/2020       Reactions   Latex Anaphylaxis, Hives, Itching   Nsaids Other (See Comments)   Serve ulcers; stomach aches, GERD.    Tomato Anaphylaxis, Hives, Itching   Tramadol Itching        Medication List     STOP taking these medications    cyclobenzaprine 10 MG tablet Commonly known as: FLEXERIL   diphenhydramine-acetaminophen 25-500 MG Tabs tablet Commonly known as: TYLENOL PM   propranolol 20 MG tablet Commonly known as: INDERAL       TAKE these medications    acetaminophen 325 MG tablet Commonly known as: TYLENOL Take 2 tablets (650 mg total) by mouth every 6 (six) hours as needed for mild pain (or Fever >/= 101).   ALPRAZolam 0.25 MG tablet Commonly known as: XANAX Take 0.25 mg by mouth 2 (two) times daily as needed.   amLODipine 10 MG tablet Commonly known as: NORVASC Take 0.5 tablets (5 mg total) by mouth daily. What changed: medication strength   diclofenac Sodium 1 % Gel Commonly known as: VOLTAREN SMARTSIG:5 Gram(s) Topical 4 Times Daily   dicyclomine 10 MG capsule Commonly known as: Bentyl Take 1 capsule (10 mg total) by mouth 4 (four) times daily for 14 days.   lisinopril 20 MG tablet Commonly known as: ZESTRIL Take 1 tablet (20 mg total) by mouth 2 (two) times daily at 10 AM and 5 PM. What changed: when to take this   montelukast 10 MG tablet Commonly known as: SINGULAIR Take 10 mg by mouth daily.   naloxone 4 MG/0.1ML Liqd nasal spray kit Commonly known as: NARCAN Place 1 spray into the nose once.   ondansetron 4 MG disintegrating tablet Commonly known as: Zofran ODT Take 1 tablet (4 mg total) by mouth every 8 (eight) hours as needed for nausea or vomiting.   Oxycodone HCl 10 MG Tabs Take 10 mg by  mouth every 6 (six) hours.   pantoprazole 20 MG tablet Commonly known as: Protonix Take 1 tablet (20 mg total) by mouth daily.   ProAir HFA 108 (90 Base) MCG/ACT inhaler Generic drug: albuterol Inhale 2 puffs into the lungs every 4 (four) hours as needed.   Symbicort 160-4.5 MCG/ACT inhaler Generic drug: budesonide-formoterol Inhale 2 puffs into the lungs 2 (two) times daily.        Vitals:   04/05/20 0410 04/05/20 1204  BP: (!) 154/91 (!) 154/81  Pulse: 71 64  Resp: 18   Temp: 99 F (37.2 C) 98.2 F (36.8 C)  SpO2: 100% 100%    Skin clean, dry and intact without evidence of skin break down, no evidence of skin tears noted. IV catheter discontinued intact. Site without signs and symptoms of complications. Dressing and pressure applied. Pt denies pain at this time. No complaints noted.  An After Visit Summary was printed and given to the patient. Patient escorted via [place], and D/C home via private auto.  Sarah Sosa

## 2020-04-30 ENCOUNTER — Emergency Department
Admission: EM | Admit: 2020-04-30 | Discharge: 2020-04-30 | Disposition: A | Discharge status: Home Health | Payer: Medicaid Other | Attending: Emergency Medicine | Admitting: Emergency Medicine

## 2020-04-30 ENCOUNTER — Other Ambulatory Visit: Payer: Self-pay

## 2020-04-30 DIAGNOSIS — Z91040 Latex allergy status: Secondary | ICD-10-CM | POA: Diagnosis not present

## 2020-04-30 DIAGNOSIS — K219 Gastro-esophageal reflux disease without esophagitis: Secondary | ICD-10-CM | POA: Insufficient documentation

## 2020-04-30 DIAGNOSIS — F17210 Nicotine dependence, cigarettes, uncomplicated: Secondary | ICD-10-CM | POA: Diagnosis not present

## 2020-04-30 DIAGNOSIS — I10 Essential (primary) hypertension: Secondary | ICD-10-CM | POA: Insufficient documentation

## 2020-04-30 DIAGNOSIS — Z79899 Other long term (current) drug therapy: Secondary | ICD-10-CM | POA: Diagnosis not present

## 2020-04-30 DIAGNOSIS — J45909 Unspecified asthma, uncomplicated: Secondary | ICD-10-CM | POA: Insufficient documentation

## 2020-04-30 DIAGNOSIS — Z7951 Long term (current) use of inhaled steroids: Secondary | ICD-10-CM | POA: Insufficient documentation

## 2020-04-30 DIAGNOSIS — R1013 Epigastric pain: Secondary | ICD-10-CM | POA: Insufficient documentation

## 2020-04-30 LAB — URINALYSIS, COMPLETE (UACMP) WITH MICROSCOPIC
Bilirubin Urine: NEGATIVE
Glucose, UA: NEGATIVE mg/dL
Hgb urine dipstick: NEGATIVE
Ketones, ur: NEGATIVE mg/dL
Leukocytes,Ua: NEGATIVE
Nitrite: NEGATIVE
Protein, ur: 30 mg/dL — AB
Specific Gravity, Urine: 1.023 (ref 1.005–1.030)
pH: 6 (ref 5.0–8.0)

## 2020-04-30 LAB — CBC
HCT: 41.1 % (ref 36.0–46.0)
Hemoglobin: 13.9 g/dL (ref 12.0–15.0)
MCH: 32.6 pg (ref 26.0–34.0)
MCHC: 33.8 g/dL (ref 30.0–36.0)
MCV: 96.3 fL (ref 80.0–100.0)
Platelets: 209 10*3/uL (ref 150–400)
RBC: 4.27 MIL/uL (ref 3.87–5.11)
RDW: 13.1 % (ref 11.5–15.5)
WBC: 4.4 10*3/uL (ref 4.0–10.5)
nRBC: 0 % (ref 0.0–0.2)

## 2020-04-30 LAB — COMPREHENSIVE METABOLIC PANEL
ALT: 9 U/L (ref 0–44)
AST: 16 U/L (ref 15–41)
Albumin: 3.7 g/dL (ref 3.5–5.0)
Alkaline Phosphatase: 56 U/L (ref 38–126)
Anion gap: 11 (ref 5–15)
BUN: 6 mg/dL (ref 6–20)
CO2: 25 mmol/L (ref 22–32)
Calcium: 8.8 mg/dL — ABNORMAL LOW (ref 8.9–10.3)
Chloride: 105 mmol/L (ref 98–111)
Creatinine, Ser: 0.79 mg/dL (ref 0.44–1.00)
GFR, Estimated: >60 mL/min (ref >60)
Glucose, Bld: 116 mg/dL — ABNORMAL HIGH (ref 70–99)
Potassium: 3.1 mmol/L — ABNORMAL LOW (ref 3.5–5.1)
Sodium: 141 mmol/L (ref 135–145)
Total Bilirubin: 0.7 mg/dL (ref 0.3–1.2)
Total Protein: 7.4 g/dL (ref 6.5–8.1)

## 2020-04-30 LAB — LIPASE, BLOOD: Lipase: 95 U/L — ABNORMAL HIGH (ref 11–51)

## 2020-04-30 LAB — TROPONIN I (HIGH SENSITIVITY)
Troponin I (High Sensitivity): 8 ng/L (ref <18)
Troponin I (High Sensitivity): 9 ng/L (ref <18)

## 2020-04-30 MED ORDER — DROPERIDOL 2.5 MG/ML IJ SOLN
1.2500 mg | Freq: Once | INTRAMUSCULAR | Status: AC
  Administered 2020-04-30: 1.25 mg via INTRAVENOUS
  Filled 2020-04-30: qty 2

## 2020-04-30 MED ORDER — SUCRALFATE 1 G PO TABS: 1.0000 g | ORAL_TABLET | Freq: Four times a day (QID) | ORAL | 0 refills | Status: DC

## 2020-04-30 NOTE — ED Provider Notes (Signed)
[facility] Emergency Department Provider Note   ____________________________________________    I have reviewed the triage vital signs and the nursing notes.   HISTORY  Chief Complaint Abdominal Pain     HPI Sarah Sosa is a 39 y.o. female with a history of pancreatitis, chronic abdominal pain, hypertension status post cholecystectomy and hysterectomy presents with complaints of epigastric pain.  She reports it feels similar to prior episodes of pancreatitis.  She denies fevers or chills.  Has not take anything for this.  Some nausea no vomiting.  No diarrhea.  No sick contacts.  Pain is in the epigastrium and radiates to the right upper quadrant.  She has had a cholecystectomy.  Symptoms started 1 to 2 days ago  Past Medical History:  Diagnosis Date  . Arthritis   . Asthma   . Chronic pain disorder 03/19/2018  . GERD (gastroesophageal reflux disease)   . Headache   . Heart murmur   . History of trichomoniasis 03/19/2018  . Hypertension   . Pancreatitis   . PID (pelvic inflammatory disease) 02/14/2018  . Uterine leiomyoma 03/19/2018    Patient Active Problem List   Diagnosis Date Noted  . Hypertensive urgency 04/03/2020  . Hypokalemia 04/03/2020  . Tobacco abuse 04/03/2020  . History of substance abuse ([place]) 04/03/2020  . Acute pancreatitis 12/09/2019  . Ileus ([place]) 04/21/2018  . Postoperative ileus ([place]) 04/20/2018  . Postoperative state 04/15/2018  . Uterine leiomyoma 03/19/2018  . Menorrhagia with regular cycle 03/19/2018  . Chronic pain disorder 03/19/2018  . Pelvic pain 03/19/2018  . Essential hypertension 03/19/2018    Past Surgical History:  Procedure Laterality Date  . ABDOMINAL HYSTERECTOMY    . CHOLECYSTECTOMY    . DENTAL SURGERY    . HYSTERECTOMY ABDOMINAL WITH SALPINGECTOMY Bilateral 04/15/2018   Procedure: HYSTERECTOMY ABDOMINAL WITH BILATERAL SALPINGECTOMY;  Surgeon: Rubie Maid, MD;  Location: [facility] ORS;  Service:  Gynecology;  Laterality: Bilateral;  . kenn surgery Bilateral   . KNEE ARTHROSCOPY Left 2012, 2013    Prior to Admission medications   Medication Sig Start Date End Date Taking? Authorizing Provider  acetaminophen (TYLENOL) 325 MG tablet Take 2 tablets (650 mg total) by mouth every 6 (six) hours as needed for mild pain (or Fever >/= 101). 12/11/19  Yes Nolberto Hanlon, MD  amLODipine (NORVASC) 10 MG tablet Take 0.5 tablets (5 mg total) by mouth daily. 04/05/20 05/05/20 Yes Nolberto Hanlon, MD  cyclobenzaprine (FLEXERIL) 10 MG tablet Take 10 mg by mouth 3 (three) times daily. 04/27/20  Yes [provider]  lisinopril (ZESTRIL) 20 MG tablet Take 1 tablet (20 mg total) by mouth 2 (two) times daily at 10 AM and 5 PM. Patient taking differently: Take 20 mg by mouth at bedtime.  04/05/20 05/05/20 Yes Nolberto Hanlon, MD  montelukast (SINGULAIR) 10 MG tablet Take 10 mg by mouth daily. 03/30/20  Yes [provider]  ondansetron (ZOFRAN ODT) 4 MG disintegrating tablet Take 1 tablet (4 mg total) by mouth every 8 (eight) hours as needed for nausea or vomiting. 03/23/20  Yes Paulette Blanch, MD  Oxycodone HCl 10 MG TABS Take 10 mg by mouth every 6 (six) hours. 04/01/20  Yes [provider]  PROAIR HFA 108 (90 Base) MCG/ACT inhaler Inhale 2 puffs into the lungs every 4 (four) hours as needed. 03/30/20  Yes [provider]  SYMBICORT 160-4.5 MCG/ACT inhaler Inhale 2 puffs into the lungs 2 (two) times daily. 03/30/20  Yes [provider]  sucralfate (CARAFATE) 1 g tablet Take 1 tablet (1 g total) by mouth 4 (four) times daily for 15 days. 04/30/20 05/15/20  Lavonia Drafts, MD     Allergies Latex, Nsaids, Tomato, and Tramadol  Family History  Problem Relation Age of Onset  . Fibroids Mother   . Hypertension Mother   . Arthritis Mother     Social History Social History   Tobacco Use  . Smoking status: Current Some Day Smoker    Packs/day: 0.50    Years: 22.00    Pack  years: 11.00    Types: Cigarettes  . Smokeless tobacco: Never Used  Vaping Use  . Vaping Use: Never used  Substance Use Topics  . Alcohol use: Yes    Comment: occasional  . Drug use: Yes    Types: Marijuana    Review of Systems  Constitutional: No fever/chills Eyes: No visual changes.  ENT: No sore throat. Cardiovascular: Denies chest pain. Respiratory: Denies shortness of breath. Gastrointestinal: No abdominal pain.  No nausea, no vomiting.   Genitourinary: Negative for dysuria. Musculoskeletal: Negative for back pain. Skin: Negative for rash. Neurological: Negative for headaches or weakness   ____________________________________________   PHYSICAL EXAM:  VITAL SIGNS: ED Triage Vitals  Enc Vitals Group     BP 04/30/20 0454 (!) 200/113     Pulse Rate 04/30/20 0454 89     Resp 04/30/20 0454 18     Temp 04/30/20 0454 98.7 F (37.1 C)     Temp Source 04/30/20 0454 Oral     SpO2 04/30/20 0454 100 %     Weight 04/30/20 0455 98 kg (216 lb)     Height 04/30/20 0455 1.676 m (5\' 6" )     Head Circumference --      Peak Flow --      Pain Score 04/30/20 0454 10     Pain Loc --      Pain Edu? --      Excl. in [place]? --     Constitutional: Alert and oriented. No acute distress.  Nose: No congestion/rhinnorhea. Mouth/Throat: Mucous membranes are moist.    Cardiovascular: Normal rate, regular rhythm. Grossly normal heart sounds.  Good peripheral circulation. Respiratory: Normal respiratory effort.  No retractions. Lungs CTAB. Gastrointestinal: Mild tenderness in the epigastrium, soft, no distention, no CVA tenderness  Musculoskeletal: .  Warm and well perfused Neurologic:  Normal speech and language. No gross focal neurologic deficits are appreciated.  Skin:  Skin is warm, dry and intact. No rash noted. Psychiatric: Mood and affect are normal. Speech and behavior are normal.  ____________________________________________   LABS (all labs ordered are listed, but only  abnormal results are displayed)  Labs Reviewed  COMPREHENSIVE METABOLIC PANEL - Abnormal; Notable for the following components:      Result Value   Potassium 3.1 (*)    Glucose, Bld 116 (*)    Calcium 8.8 (*)    All other components within normal limits  LIPASE, BLOOD - Abnormal; Notable for the following components:   Lipase 95 (*)    All other components within normal limits  URINALYSIS, COMPLETE (UACMP) WITH MICROSCOPIC - Abnormal; Notable for the following components:   Color, Urine YELLOW (*)    APPearance HAZY (*)    Protein, ur 30 (*)    Bacteria, UA RARE (*)    All other components within normal limits  CBC  TROPONIN I (HIGH SENSITIVITY)  TROPONIN I (HIGH SENSITIVITY)   ____________________________________________  EKG  ED  ECG REPORT I, Lavonia Drafts, the attending physician, personally viewed and interpreted this ECG.  Date: 04/30/2020  Rhythm: normal sinus rhythm QRS Axis: normal Intervals: normal ST/T Wave abnormalities: LVH Narrative Interpretation: Left ventricular hypertrophy  ____________________________________________  RADIOLOGY  None ____________________________________________   PROCEDURES  Procedure(s) performed: No  Procedures   Critical Care performed: No ____________________________________________   INITIAL IMPRESSION / ASSESSMENT AND PLAN / ED COURSE  Pertinent labs & imaging results that were available during my care of the patient were reviewed by me and considered in my medical decision making (see chart for details).  Patient with a history of pancreatitis presents with what she describes as her typical pancreatic pain, she describes as a burning pain epigastrium with some radiation to her right upper quadrant.  Review of medical records demonstrates multiple ED visits for this in the past including a couple of admissions.  Multiple CT imaging studies have been done which have overall been without acute abnormalities.  Exam  is overall reassuring, mild tenderness in the epigastrium, she has had good response to droperidol in the past, will give 1.25 mg of droperidol.  Lipase is mildly elevated however lower than in the past, otherwise lab work is reassuring  Significant hypertension, this is not new for her.  No evidence of endorgan damage.  Patient resting comfortably after droperidol, she is feeling improved, appropriate discharge at this time with outpatient follow-up for her chronic condition.   ____________________________________________   FINAL CLINICAL IMPRESSION(S) / ED DIAGNOSES  Final diagnoses:  Epigastric pain        Note:  This document was prepared using Dragon voice recognition software and may include unintentional dictation errors.   Lavonia Drafts, MD 04/30/20 1054

## 2020-04-30 NOTE — ED Triage Notes (Signed)
Pt in with co upper abd pain that radiates to RUQ for 2 days, pt has hx of pancreatitis. Pt has had n.v.d no dysuria.

## 2020-06-01 ENCOUNTER — Emergency Department
Admission: EM | Admit: 2020-06-01 | Discharge: 2020-06-01 | Disposition: A | Discharge status: Home / Self Care | Payer: Medicaid Other | Attending: Emergency Medicine | Admitting: Emergency Medicine

## 2020-06-01 ENCOUNTER — Other Ambulatory Visit: Payer: Self-pay

## 2020-06-01 ENCOUNTER — Encounter: Payer: Self-pay | Admitting: Radiology

## 2020-06-01 ENCOUNTER — Emergency Department: Payer: Medicaid Other

## 2020-06-01 DIAGNOSIS — E876 Hypokalemia: Secondary | ICD-10-CM | POA: Diagnosis not present

## 2020-06-01 DIAGNOSIS — Z20822 Contact with and (suspected) exposure to covid-19: Secondary | ICD-10-CM | POA: Insufficient documentation

## 2020-06-01 DIAGNOSIS — I10 Essential (primary) hypertension: Secondary | ICD-10-CM | POA: Diagnosis not present

## 2020-06-01 DIAGNOSIS — R1084 Generalized abdominal pain: Secondary | ICD-10-CM | POA: Insufficient documentation

## 2020-06-01 DIAGNOSIS — K859 Acute pancreatitis without necrosis or infection, unspecified: Secondary | ICD-10-CM | POA: Diagnosis not present

## 2020-06-01 DIAGNOSIS — Z91040 Latex allergy status: Secondary | ICD-10-CM | POA: Diagnosis not present

## 2020-06-01 DIAGNOSIS — J45909 Unspecified asthma, uncomplicated: Secondary | ICD-10-CM | POA: Insufficient documentation

## 2020-06-01 DIAGNOSIS — I160 Hypertensive urgency: Secondary | ICD-10-CM | POA: Insufficient documentation

## 2020-06-01 DIAGNOSIS — Z79899 Other long term (current) drug therapy: Secondary | ICD-10-CM | POA: Diagnosis not present

## 2020-06-01 DIAGNOSIS — F17210 Nicotine dependence, cigarettes, uncomplicated: Secondary | ICD-10-CM | POA: Insufficient documentation

## 2020-06-01 DIAGNOSIS — R1013 Epigastric pain: Secondary | ICD-10-CM | POA: Diagnosis present

## 2020-06-01 LAB — COMPREHENSIVE METABOLIC PANEL
ALT: 8 U/L (ref 0–44)
AST: 22 U/L (ref 15–41)
Albumin: 3.9 g/dL (ref 3.5–5.0)
Alkaline Phosphatase: 62 U/L (ref 38–126)
Anion gap: 12 (ref 5–15)
BUN: 8 mg/dL (ref 6–20)
CO2: 27 mmol/L (ref 22–32)
Calcium: 8.7 mg/dL — ABNORMAL LOW (ref 8.9–10.3)
Chloride: 99 mmol/L (ref 98–111)
Creatinine, Ser: 0.81 mg/dL (ref 0.44–1.00)
GFR, Estimated: >60 mL/min (ref >60)
Glucose, Bld: 120 mg/dL — ABNORMAL HIGH (ref 70–99)
Potassium: 2.8 mmol/L — ABNORMAL LOW (ref 3.5–5.1)
Sodium: 138 mmol/L (ref 135–145)
Total Bilirubin: 1.1 mg/dL (ref 0.3–1.2)
Total Protein: 7.4 g/dL (ref 6.5–8.1)

## 2020-06-01 LAB — RESPIRATORY PANEL BY RT PCR (FLU A&B, COVID)
Influenza A by PCR: NEGATIVE
Influenza B by PCR: NEGATIVE
SARS Coronavirus 2 by RT PCR: NEGATIVE

## 2020-06-01 LAB — CBC
HCT: 41.7 % (ref 36.0–46.0)
Hemoglobin: 14.4 g/dL (ref 12.0–15.0)
MCH: 33.4 pg (ref 26.0–34.0)
MCHC: 34.5 g/dL (ref 30.0–36.0)
MCV: 96.8 fL (ref 80.0–100.0)
Platelets: 253 10*3/uL (ref 150–400)
RBC: 4.31 MIL/uL (ref 3.87–5.11)
RDW: 13.2 % (ref 11.5–15.5)
WBC: 6.3 10*3/uL (ref 4.0–10.5)
nRBC: 0 % (ref 0.0–0.2)

## 2020-06-01 LAB — TROPONIN I (HIGH SENSITIVITY): Troponin I (High Sensitivity): 13 ng/L (ref <18)

## 2020-06-01 LAB — LIPASE, BLOOD: Lipase: 587 U/L — ABNORMAL HIGH (ref 11–51)

## 2020-06-01 LAB — ETHANOL: Alcohol, Ethyl (B): <10 mg/dL (ref <10)

## 2020-06-01 LAB — MAGNESIUM: Magnesium: 1.1 mg/dL — ABNORMAL LOW (ref 1.7–2.4)

## 2020-06-01 LAB — HCG, QUANTITATIVE, PREGNANCY: hCG, Beta Chain, Quant, S: 1 m[IU]/mL (ref <5)

## 2020-06-01 MED ORDER — OXYCODONE-ACETAMINOPHEN 5-325 MG PO TABS
2.0000 | ORAL_TABLET | Freq: Once | ORAL | Status: AC
  Administered 2020-06-01: 2 via ORAL
  Filled 2020-06-01: qty 2

## 2020-06-01 MED ORDER — HYDROMORPHONE HCL 1 MG/ML IJ SOLN
0.5000 mg | Freq: Once | INTRAMUSCULAR | Status: AC
  Administered 2020-06-01: 0.5 mg via INTRAVENOUS
  Filled 2020-06-01: qty 1

## 2020-06-01 MED ORDER — POTASSIUM CHLORIDE 10 MEQ/100ML IV SOLN
10.0000 meq | INTRAVENOUS | Status: DC
  Administered 2020-06-01: 10 meq via INTRAVENOUS
  Filled 2020-06-01: qty 100

## 2020-06-01 MED ORDER — PROCHLORPERAZINE EDISYLATE 10 MG/2ML IJ SOLN: 10.0000 mg | Freq: Once | INTRAMUSCULAR | Status: DC

## 2020-06-01 MED ORDER — MAGNESIUM SULFATE 2 GM/50ML IV SOLN
2.0000 g | Freq: Once | INTRAVENOUS | Status: AC
  Administered 2020-06-01: 2 g via INTRAVENOUS
  Filled 2020-06-01: qty 50

## 2020-06-01 MED ORDER — LABETALOL HCL 5 MG/ML IV SOLN
10.0000 mg | Freq: Once | INTRAVENOUS | Status: AC
  Administered 2020-06-01: 10 mg via INTRAVENOUS
  Filled 2020-06-01: qty 4

## 2020-06-01 MED ORDER — LACTATED RINGERS IV BOLUS
1000.0000 mL | Freq: Once | INTRAVENOUS | Status: AC
  Administered 2020-06-01: 1000 mL via INTRAVENOUS

## 2020-06-01 MED ORDER — ONDANSETRON HCL 4 MG/2ML IJ SOLN
4.0000 mg | Freq: Once | INTRAMUSCULAR | Status: AC | PRN
  Administered 2020-06-01: 4 mg via INTRAVENOUS
  Filled 2020-06-01: qty 2

## 2020-06-01 MED ORDER — OXYCODONE-ACETAMINOPHEN 5-325 MG PO TABS
1.0000 | ORAL_TABLET | Freq: Three times a day (TID) | ORAL | 0 refills | Status: DC | PRN
Start: 2020-06-01 — End: 2020-06-05

## 2020-06-01 MED ORDER — IOHEXOL 300 MG/ML  SOLN
100.0000 mL | Freq: Once | INTRAMUSCULAR | Status: AC | PRN
  Administered 2020-06-01: 100 mL via INTRAVENOUS

## 2020-06-01 MED ORDER — POTASSIUM CHLORIDE CRYS ER 20 MEQ PO TBCR
40.0000 meq | EXTENDED_RELEASE_TABLET | Freq: Once | ORAL | Status: AC
  Administered 2020-06-01: 40 meq via ORAL
  Filled 2020-06-01: qty 2

## 2020-06-01 MED ORDER — ONDANSETRON HCL 4 MG PO TABS: 4.0000 mg | ORAL_TABLET | Freq: Three times a day (TID) | ORAL | 0 refills | Status: DC | PRN

## 2020-06-01 NOTE — ED Notes (Signed)
Iv dc'ed.  D/c inst to pt 

## 2020-06-01 NOTE — ED Triage Notes (Signed)
Pt reports hx of pancreatitis and states that she has been having abd pain with n/v/d since Sunday, pt is tearful in triage, states that she has been unable to keep her bp meds down

## 2020-06-01 NOTE — ED Notes (Signed)
Resumed care from [place] rn.  Pt in hallway bed  Pt alert.  Iv meds given and po pain meds.  Pt alert.

## 2020-06-01 NOTE — ED Provider Notes (Signed)
[facility] Emergency Department Provider Note  ____________________________________________   First MD Initiated Contact with Patient 06/01/20 1225     (approximate)  I have reviewed the triage vital signs and the nursing notes.   HISTORY  Chief Complaint Abdominal Pain   HPI Sarah Sosa is a 39 y.o. female with past medical history of asthma, arthritis, HTN, and GERD as well as alcoholic related pancreatitis who presents for assessment of 3 days of epigastric abdominal pain associate with vomiting diarrhea, poor p.o. intake, and general malaise.  Patient states 2 days before symptoms started she had 3-4 shots of liquor but none since.  She states she is not typically a daily drinker.  She thinks there are some streaks of blood in her vomit blood in her diarrhea.  She denies any fevers or urinary symptoms, cough, extremity pain, rash, recent traumatic injuries.  Denies illegal drug use and states she feels very similar to how she did last time she pancreatitis.         Past Medical History:  Diagnosis Date  . Arthritis   . Asthma   . Chronic pain disorder 03/19/2018  . GERD (gastroesophageal reflux disease)   . Headache   . Heart murmur   . History of trichomoniasis 03/19/2018  . Hypertension   . Pancreatitis   . PID (pelvic inflammatory disease) 02/14/2018  . Uterine leiomyoma 03/19/2018    Patient Active Problem List   Diagnosis Date Noted  . Hypertensive urgency 04/03/2020  . Hypokalemia 04/03/2020  . Tobacco abuse 04/03/2020  . History of substance abuse ([place]) 04/03/2020  . Acute pancreatitis 12/09/2019  . Ileus ([place]) 04/21/2018  . Postoperative ileus ([place]) 04/20/2018  . Postoperative state 04/15/2018  . Uterine leiomyoma 03/19/2018  . Menorrhagia with regular cycle 03/19/2018  . Chronic pain disorder 03/19/2018  . Pelvic pain 03/19/2018  . Essential hypertension 03/19/2018    Past Surgical History:  Procedure Laterality Date  .  ABDOMINAL HYSTERECTOMY    . CHOLECYSTECTOMY    . DENTAL SURGERY    . HYSTERECTOMY ABDOMINAL WITH SALPINGECTOMY Bilateral 04/15/2018   Procedure: HYSTERECTOMY ABDOMINAL WITH BILATERAL SALPINGECTOMY;  Surgeon: Rubie Maid, MD;  Location: [facility] ORS;  Service: Gynecology;  Laterality: Bilateral;  . kenn surgery Bilateral   . KNEE ARTHROSCOPY Left 2012, 2013    Prior to Admission medications   Medication Sig Start Date End Date Taking? Authorizing Provider  amLODipine (NORVASC) 10 MG tablet Take 0.5 tablets (5 mg total) by mouth daily. 04/05/20 05/05/20  Nolberto Hanlon, MD  cyclobenzaprine (FLEXERIL) 10 MG tablet Take 10 mg by mouth 3 (three) times daily. 04/27/20   [provider]  lisinopril (ZESTRIL) 20 MG tablet Take 1 tablet (20 mg total) by mouth 2 (two) times daily at 10 AM and 5 PM. Patient taking differently: Take 20 mg by mouth at bedtime.  04/05/20 05/05/20  Nolberto Hanlon, MD  montelukast (SINGULAIR) 10 MG tablet Take 10 mg by mouth daily. 03/30/20   [provider]  ondansetron (ZOFRAN) 4 MG tablet Take 1 tablet (4 mg total) by mouth every 8 (eight) hours as needed for up to 10 doses for nausea or vomiting. 06/01/20   Lucrezia Starch, MD  Oxycodone HCl 10 MG TABS Take 10 mg by mouth every 6 (six) hours. 04/01/20   [provider]  oxyCODONE-acetaminophen (PERCOCET) 5-325 MG tablet Take 1 tablet by mouth every 8 (eight) hours as needed for up to 5 days for severe pain. 06/01/20 06/06/20  Tamala Julian,  Ida Rogue, MD  PROAIR HFA 108 [phone] Base) MCG/ACT inhaler Inhale 2 puffs into the lungs every 4 (four) hours as needed. 03/30/20   [provider]  sucralfate (CARAFATE) 1 g tablet Take 1 tablet (1 g total) by mouth 4 (four) times daily for 15 days. 04/30/20 05/15/20  Lavonia Drafts, MD  SYMBICORT 160-4.5 MCG/ACT inhaler Inhale 2 puffs into the lungs 2 (two) times daily. 03/30/20   [provider]    Allergies Latex, Nsaids, Tomato, and Tramadol  Family  History  Problem Relation Age of Onset  . Fibroids Mother   . Hypertension Mother   . Arthritis Mother     Social History Social History   Tobacco Use  . Smoking status: Current Some Day Smoker    Packs/day: 0.50    Years: 22.00    Pack years: 11.00    Types: Cigarettes  . Smokeless tobacco: Never Used  Vaping Use  . Vaping Use: Never used  Substance Use Topics  . Alcohol use: Yes    Comment: occasional  . Drug use: Yes    Types: Marijuana    Review of Systems  Review of Systems  Constitutional: Positive for malaise/fatigue. Negative for chills and fever.  HENT: Negative for sore throat.   Eyes: Negative for pain.  Respiratory: Negative for cough and stridor.   Cardiovascular: Negative for chest pain.  Gastrointestinal: Positive for abdominal pain, diarrhea, nausea and vomiting.  Genitourinary: Negative for dysuria.  Musculoskeletal: Negative for myalgias.  Skin: Negative for rash.  Neurological: Positive for headaches. Negative for seizures and loss of consciousness.  Psychiatric/Behavioral: Negative for suicidal ideas.  All other systems reviewed and are negative.     ____________________________________________   PHYSICAL EXAM:  VITAL SIGNS: ED Triage Vitals  Enc Vitals Group     BP 06/01/20 1213 (!) 205/118     Pulse Rate 06/01/20 1213 88     Resp 06/01/20 1213 20     Temp 06/01/20 1213 98.8 F (37.1 C)     Temp Source 06/01/20 1213 Oral     SpO2 06/01/20 1213 99 %     Weight 06/01/20 1218 212 lb (96.2 kg)     Height 06/01/20 1218 5\' 6"  (1.676 m)     Head Circumference --      Peak Flow --      Pain Score 06/01/20 1218 10     Pain Loc --      Pain Edu? --      Excl. in [place]? --    Vitals:   06/01/20 1213 06/01/20 1347  BP: (!) 205/118 (!) 186/98  Pulse: 88 88  Resp: 20 20  Temp: 98.8 F (37.1 C)   SpO2: 99% 99%   Physical Exam Vitals and nursing note reviewed.  Constitutional:      General: She is not in acute distress.    Appearance:  She is well-developed. She is ill-appearing.  HENT:     Head: Normocephalic and atraumatic.     Right Ear: External ear normal.     Left Ear: External ear normal.     Mouth/Throat:     Mouth: Mucous membranes are dry.  Eyes:     Conjunctiva/sclera: Conjunctivae normal.  Cardiovascular:     Rate and Rhythm: Normal rate and regular rhythm.     Pulses: Normal pulses.     Heart sounds: No murmur heard.   Pulmonary:     Effort: Pulmonary effort is normal. No respiratory distress.  Breath sounds: Normal breath sounds.  Abdominal:     Palpations: Abdomen is soft.     Tenderness: There is generalized abdominal tenderness. There is no right CVA tenderness or left CVA tenderness.  Musculoskeletal:     Cervical back: Neck supple.  Skin:    General: Skin is warm and dry.     Capillary Refill: Capillary refill takes 2 to 3 seconds.  Neurological:     Mental Status: She is alert and oriented to person, place, and time.  Psychiatric:        Mood and Affect: Mood normal.      ____________________________________________   LABS (all labs ordered are listed, but only abnormal results are displayed)  Labs Reviewed  LIPASE, BLOOD - Abnormal; Notable for the following components:      Result Value   Lipase 587 (*)    All other components within normal limits  COMPREHENSIVE METABOLIC PANEL - Abnormal; Notable for the following components:   Potassium 2.8 (*)    Glucose, Bld 120 (*)    Calcium 8.7 (*)    All other components within normal limits  MAGNESIUM - Abnormal; Notable for the following components:   Magnesium 1.1 (*)    All other components within normal limits  RESPIRATORY PANEL BY RT PCR (FLU A&B, COVID)  CBC  HCG, QUANTITATIVE, PREGNANCY  ETHANOL  URINALYSIS, COMPLETE (UACMP) WITH MICROSCOPIC  URINE DRUG SCREEN, QUALITATIVE ([facility] ONLY)  POC URINE PREG, ED  TROPONIN I (HIGH SENSITIVITY)   ____________________________________________  EKG  Sinus rhythm with a  ventricular rate of 82, normal axis, unremarkable intervals, nonspecific ST changes in septal leads and some nonspecific ST changes in the lateral and inferior leads. ____________________________________________  RADIOLOGY   Official radiology report(s): CT ABDOMEN PELVIS W CONTRAST  Result Date: 06/01/2020 CLINICAL DATA:  History of pancreatitis.  Abdominal pain. EXAM: CT ABDOMEN AND PELVIS WITH CONTRAST TECHNIQUE: Multidetector CT imaging of the abdomen and pelvis was performed using the standard protocol following bolus administration of intravenous contrast. CONTRAST:  147mL OMNIPAQUE IOHEXOL 300 MG/ML  SOLN COMPARISON:  03/23/2020 FINDINGS: Lower chest: No acute abnormality. Hepatobiliary: Tiny subcentimeter hypodensity in the right hepatic lobe, likely cyst. Mild diffuse fatty infiltration of the liver. More pronounced low-density adjacent to the falciform ligament, likely focal fat. Prior cholecystectomy. Pancreas: Edema noted around the pancreas compatible with acute pancreatitis. No ductal dilatation or focal abnormality. Spleen: No focal abnormality.  Normal size. Adrenals/Urinary Tract: No adrenal abnormality. No focal renal abnormality. No stones or hydronephrosis. Urinary bladder is unremarkable. Stomach/Bowel: Normal appendix. Stomach, large and small bowel grossly unremarkable. Vascular/Lymphatic: No evidence of aneurysm or adenopathy. Scattered aortic calcifications. Reproductive: Prior hysterectomy. 5 cm cyst in the left adnexa, likely ovarian cyst. Other: No free fluid or free air. Musculoskeletal: No acute bony abnormality. IMPRESSION: Stranding/edema surrounding the pancreas compatible with acute pancreatitis. Mild diffuse fatty infiltration of the liver. 5 cm left ovarian cyst No follow-up imaging recommended. Note: This recommendation does not apply to premenarchal patients and to those with increased risk (genetic, family history, elevated tumor markers or other high-risk factors) of  ovarian cancer. Reference: JACR 2020 Feb; 17(2):248-254. Electronically Signed   By: Rolm Baptise M.D.   On: 06/01/2020 15:11    ____________________________________________   PROCEDURES  Procedure(s) performed (including Critical Care):  .1-3 Lead EKG Interpretation Performed by: Lucrezia Starch, MD Authorized by: Lucrezia Starch, MD     Interpretation: normal     ECG rate assessment: normal  Rhythm: sinus rhythm     Ectopy: none     Conduction: normal       ____________________________________________   INITIAL IMPRESSION / ASSESSMENT AND PLAN / ED COURSE        Patient presents with [place] to history exam for assessment of 3 days of generalized crampy epigastric abdominal pain associate with some nausea vomiting and diarrhea with some blood-streaked emesis.  Patient has a history of alcohol-related pancreatitis and does note she had several shots of liquor a day before symptoms started.  She also states he has not been taking her blood pressure medications because of her nausea and vomiting.  On arrival she is hypertensive with BP of 205/118 otherwise stable vital signs on room air.  Differential includes but is not limited to acute pancreatitis, SBO, gastroenteritis, appendicitis, and cystitis.  Work- including CT and pancreatic up including CTA and lipase most consistent with acute pancreatitis likely alcohol-related given preconceptual couple drinks a day for onset of symptoms.  No findings on CT suggest appendicitis, pyelonephritis, cystitis, SBO, or other acute intra-abdominal process.  Patient denies any urinary symptoms.  CMP has evidence of hypokalemia with a K of 2.8 but otherwise no significant ocular or metabolic derangements.  LFTs are unremarkable.  CBC is unremarkable.  Low suspicion for ACS despite nonspecific ECG changes, patient denies any chest pain and nonelevated point was obtained.  3 hours after symptom onset.  After below noted medications patient was  known to drink change well and stated she was at home.  Given she is tolerating p.o. with pain and nausea adequately controlled I believe this is reasonable.  I did counsel patient extensively on dangers of drinking alcohol again and advised her to seek follow-up with her PCP.  Discharge stable condition.  Return questions advised and discussed.  ____________________________________________   FINAL CLINICAL IMPRESSION(S) / ED DIAGNOSES  Final diagnoses:  Generalized abdominal pain  Hypokalemia  Hypertensive urgency  Acute pancreatitis, unspecified complication status, unspecified pancreatitis type  Hypomagnesemia    Medications  potassium chloride 10 mEq in 100 mL IVPB (10 mEq Intravenous New Bag/Given 06/01/20 1513)  prochlorperazine (COMPAZINE) injection 10 mg (10 mg Intravenous Not Given 06/01/20 1443)  magnesium sulfate IVPB 2 g 50 mL (has no administration in time range)  potassium chloride SA (KLOR-CON) CR tablet 40 mEq (has no administration in time range)  ondansetron (ZOFRAN) injection 4 mg (4 mg Intravenous Given 06/01/20 1301)  lactated ringers bolus 1,000 mL (0 mLs Intravenous Stopped 06/01/20 1347)  HYDROmorphone (DILAUDID) injection 0.5 mg (0.5 mg Intravenous Given 06/01/20 1302)  labetalol (NORMODYNE) injection 10 mg (10 mg Intravenous Given 06/01/20 1301)  HYDROmorphone (DILAUDID) injection 0.5 mg (0.5 mg Intravenous Given 06/01/20 1345)  iohexol (OMNIPAQUE) 300 MG/ML solution 100 mL (100 mLs Intravenous Contrast Given 06/01/20 1443)  oxyCODONE-acetaminophen (PERCOCET/ROXICET) 5-325 MG per tablet 2 tablet (2 tablets Oral Given 06/01/20 1515)     ED Discharge Orders         Ordered    oxyCODONE-acetaminophen (PERCOCET) 5-325 MG tablet  Every 8 hours PRN        06/01/20 1542    ondansetron (ZOFRAN) 4 MG tablet  Every 8 hours PRN        06/01/20 1542           Note:  This document was prepared using Dragon voice recognition software and may include unintentional  dictation errors.   Lucrezia Starch, MD 06/01/20 [phone]

## 2020-06-03 ENCOUNTER — Encounter: Payer: Self-pay | Admitting: Emergency Medicine

## 2020-06-03 ENCOUNTER — Other Ambulatory Visit: Payer: Self-pay

## 2020-06-03 ENCOUNTER — Inpatient Hospital Stay
Admission: EM | Admit: 2020-06-03 | Discharge: 2020-06-08 | DRG: 440 | Disposition: A | Discharge status: Home / Self Care | Payer: Medicaid Other | Attending: Hospitalist | Admitting: Hospitalist

## 2020-06-03 DIAGNOSIS — Z91040 Latex allergy status: Secondary | ICD-10-CM

## 2020-06-03 DIAGNOSIS — R519 Headache, unspecified: Secondary | ICD-10-CM | POA: Diagnosis present

## 2020-06-03 DIAGNOSIS — M1990 Unspecified osteoarthritis, unspecified site: Secondary | ICD-10-CM | POA: Diagnosis present

## 2020-06-03 DIAGNOSIS — R112 Nausea with vomiting, unspecified: Secondary | ICD-10-CM

## 2020-06-03 DIAGNOSIS — Z79891 Long term (current) use of opiate analgesic: Secondary | ICD-10-CM

## 2020-06-03 DIAGNOSIS — F1010 Alcohol abuse, uncomplicated: Secondary | ICD-10-CM | POA: Diagnosis present

## 2020-06-03 DIAGNOSIS — K760 Fatty (change of) liver, not elsewhere classified: Secondary | ICD-10-CM | POA: Diagnosis present

## 2020-06-03 DIAGNOSIS — Z20822 Contact with and (suspected) exposure to covid-19: Secondary | ICD-10-CM | POA: Diagnosis present

## 2020-06-03 DIAGNOSIS — F1290 Cannabis use, unspecified, uncomplicated: Secondary | ICD-10-CM

## 2020-06-03 DIAGNOSIS — Z8261 Family history of arthritis: Secondary | ICD-10-CM

## 2020-06-03 DIAGNOSIS — Z885 Allergy status to narcotic agent status: Secondary | ICD-10-CM

## 2020-06-03 DIAGNOSIS — I10 Essential (primary) hypertension: Secondary | ICD-10-CM

## 2020-06-03 DIAGNOSIS — F1210 Cannabis abuse, uncomplicated: Secondary | ICD-10-CM | POA: Diagnosis present

## 2020-06-03 DIAGNOSIS — E876 Hypokalemia: Secondary | ICD-10-CM | POA: Diagnosis present

## 2020-06-03 DIAGNOSIS — K219 Gastro-esophageal reflux disease without esophagitis: Secondary | ICD-10-CM | POA: Diagnosis present

## 2020-06-03 DIAGNOSIS — F17210 Nicotine dependence, cigarettes, uncomplicated: Secondary | ICD-10-CM | POA: Diagnosis present

## 2020-06-03 DIAGNOSIS — IMO0002 Reserved for concepts with insufficient information to code with codable children: Secondary | ICD-10-CM | POA: Diagnosis present

## 2020-06-03 DIAGNOSIS — Z79899 Other long term (current) drug therapy: Secondary | ICD-10-CM

## 2020-06-03 DIAGNOSIS — Z7951 Long term (current) use of inhaled steroids: Secondary | ICD-10-CM

## 2020-06-03 DIAGNOSIS — Z8249 Family history of ischemic heart disease and other diseases of the circulatory system: Secondary | ICD-10-CM

## 2020-06-03 DIAGNOSIS — Z91018 Allergy to other foods: Secondary | ICD-10-CM

## 2020-06-03 DIAGNOSIS — N83202 Unspecified ovarian cyst, left side: Secondary | ICD-10-CM | POA: Diagnosis present

## 2020-06-03 DIAGNOSIS — G894 Chronic pain syndrome: Secondary | ICD-10-CM | POA: Diagnosis present

## 2020-06-03 DIAGNOSIS — Z884 Allergy status to anesthetic agent status: Secondary | ICD-10-CM

## 2020-06-03 DIAGNOSIS — K859 Acute pancreatitis without necrosis or infection, unspecified: Secondary | ICD-10-CM | POA: Diagnosis present

## 2020-06-03 DIAGNOSIS — K852 Alcohol induced acute pancreatitis without necrosis or infection: Principal | ICD-10-CM | POA: Diagnosis present

## 2020-06-03 DIAGNOSIS — J45909 Unspecified asthma, uncomplicated: Secondary | ICD-10-CM | POA: Diagnosis present

## 2020-06-03 LAB — COMPREHENSIVE METABOLIC PANEL
ALT: 8 U/L (ref 0–44)
AST: 23 U/L (ref 15–41)
Albumin: 4.1 g/dL (ref 3.5–5.0)
Alkaline Phosphatase: 68 U/L (ref 38–126)
Anion gap: 13 (ref 5–15)
BUN: 8 mg/dL (ref 6–20)
CO2: 27 mmol/L (ref 22–32)
Calcium: 9.4 mg/dL (ref 8.9–10.3)
Chloride: 99 mmol/L (ref 98–111)
Creatinine, Ser: 0.94 mg/dL (ref 0.44–1.00)
GFR, Estimated: >60 mL/min (ref >60)
Glucose, Bld: 99 mg/dL (ref 70–99)
Potassium: 3 mmol/L — ABNORMAL LOW (ref 3.5–5.1)
Sodium: 139 mmol/L (ref 135–145)
Total Bilirubin: 0.7 mg/dL (ref 0.3–1.2)
Total Protein: 8.1 g/dL (ref 6.5–8.1)

## 2020-06-03 LAB — CBC
HCT: 46.9 % — ABNORMAL HIGH (ref 36.0–46.0)
Hemoglobin: 15.8 g/dL — ABNORMAL HIGH (ref 12.0–15.0)
MCH: 33.6 pg (ref 26.0–34.0)
MCHC: 33.7 g/dL (ref 30.0–36.0)
MCV: 99.8 fL (ref 80.0–100.0)
Platelets: 237 10*3/uL (ref 150–400)
RBC: 4.7 MIL/uL (ref 3.87–5.11)
RDW: 13.3 % (ref 11.5–15.5)
WBC: 10.5 10*3/uL (ref 4.0–10.5)
nRBC: 0 % (ref 0.0–0.2)

## 2020-06-03 LAB — LIPASE, BLOOD: Lipase: 197 U/L — ABNORMAL HIGH (ref 11–51)

## 2020-06-03 LAB — MAGNESIUM: Magnesium: 1.4 mg/dL — ABNORMAL LOW (ref 1.7–2.4)

## 2020-06-03 MED ORDER — POTASSIUM CHLORIDE 20 MEQ PO PACK
40.0000 meq | PACK | ORAL | Status: AC
  Administered 2020-06-04: 40 meq via ORAL
  Filled 2020-06-03: qty 2

## 2020-06-03 MED ORDER — MAGNESIUM SULFATE 2 GM/50ML IV SOLN
2.0000 g | Freq: Once | INTRAVENOUS | Status: AC
  Administered 2020-06-04: 2 g via INTRAVENOUS
  Filled 2020-06-03: qty 50

## 2020-06-03 MED ORDER — LACTATED RINGERS IV BOLUS
1000.0000 mL | Freq: Once | INTRAVENOUS | Status: AC
  Administered 2020-06-04: 1000 mL via INTRAVENOUS

## 2020-06-03 MED ORDER — DROPERIDOL 2.5 MG/ML IJ SOLN
2.5000 mg | Freq: Once | INTRAMUSCULAR | Status: AC
  Administered 2020-06-04: 2.5 mg via INTRAVENOUS
  Filled 2020-06-03: qty 2

## 2020-06-03 NOTE — ED Notes (Signed)
Lab called to draw labs

## 2020-06-03 NOTE — ED Triage Notes (Signed)
Pt arrived via POV with reports of abdominal pain related to pancreatitis dx a couple of days ago, seen here before in the past for the same.  Pt took pain meds around 630pm.  Pt also reports N and V.  Pt states she has been able to drink some chicken broth.  Pt denies any alcohol use today.

## 2020-06-04 DIAGNOSIS — Z91018 Allergy to other foods: Secondary | ICD-10-CM | POA: Diagnosis not present

## 2020-06-04 DIAGNOSIS — F1290 Cannabis use, unspecified, uncomplicated: Secondary | ICD-10-CM | POA: Diagnosis not present

## 2020-06-04 DIAGNOSIS — Z79899 Other long term (current) drug therapy: Secondary | ICD-10-CM | POA: Diagnosis not present

## 2020-06-04 DIAGNOSIS — R519 Headache, unspecified: Secondary | ICD-10-CM | POA: Diagnosis present

## 2020-06-04 DIAGNOSIS — N83202 Unspecified ovarian cyst, left side: Secondary | ICD-10-CM | POA: Diagnosis present

## 2020-06-04 DIAGNOSIS — Z20822 Contact with and (suspected) exposure to covid-19: Secondary | ICD-10-CM | POA: Diagnosis present

## 2020-06-04 DIAGNOSIS — G894 Chronic pain syndrome: Secondary | ICD-10-CM | POA: Diagnosis present

## 2020-06-04 DIAGNOSIS — J45909 Unspecified asthma, uncomplicated: Secondary | ICD-10-CM | POA: Diagnosis present

## 2020-06-04 DIAGNOSIS — K852 Alcohol induced acute pancreatitis without necrosis or infection: Secondary | ICD-10-CM | POA: Diagnosis present

## 2020-06-04 DIAGNOSIS — Z8261 Family history of arthritis: Secondary | ICD-10-CM | POA: Diagnosis not present

## 2020-06-04 DIAGNOSIS — R112 Nausea with vomiting, unspecified: Secondary | ICD-10-CM | POA: Diagnosis present

## 2020-06-04 DIAGNOSIS — I10 Essential (primary) hypertension: Secondary | ICD-10-CM | POA: Diagnosis present

## 2020-06-04 DIAGNOSIS — K219 Gastro-esophageal reflux disease without esophagitis: Secondary | ICD-10-CM | POA: Diagnosis present

## 2020-06-04 DIAGNOSIS — Z884 Allergy status to anesthetic agent status: Secondary | ICD-10-CM | POA: Diagnosis not present

## 2020-06-04 DIAGNOSIS — Z91040 Latex allergy status: Secondary | ICD-10-CM | POA: Diagnosis not present

## 2020-06-04 DIAGNOSIS — Z8249 Family history of ischemic heart disease and other diseases of the circulatory system: Secondary | ICD-10-CM | POA: Diagnosis not present

## 2020-06-04 DIAGNOSIS — K859 Acute pancreatitis without necrosis or infection, unspecified: Secondary | ICD-10-CM

## 2020-06-04 DIAGNOSIS — K760 Fatty (change of) liver, not elsewhere classified: Secondary | ICD-10-CM | POA: Diagnosis present

## 2020-06-04 DIAGNOSIS — E876 Hypokalemia: Secondary | ICD-10-CM | POA: Diagnosis present

## 2020-06-04 DIAGNOSIS — F1010 Alcohol abuse, uncomplicated: Secondary | ICD-10-CM | POA: Diagnosis present

## 2020-06-04 DIAGNOSIS — F1210 Cannabis abuse, uncomplicated: Secondary | ICD-10-CM | POA: Diagnosis present

## 2020-06-04 DIAGNOSIS — Z7951 Long term (current) use of inhaled steroids: Secondary | ICD-10-CM | POA: Diagnosis not present

## 2020-06-04 DIAGNOSIS — Z885 Allergy status to narcotic agent status: Secondary | ICD-10-CM | POA: Diagnosis not present

## 2020-06-04 DIAGNOSIS — M1990 Unspecified osteoarthritis, unspecified site: Secondary | ICD-10-CM | POA: Diagnosis present

## 2020-06-04 DIAGNOSIS — F17210 Nicotine dependence, cigarettes, uncomplicated: Secondary | ICD-10-CM | POA: Diagnosis present

## 2020-06-04 LAB — URINALYSIS, COMPLETE (UACMP) WITH MICROSCOPIC
Bacteria, UA: NONE SEEN
Bilirubin Urine: NEGATIVE
Glucose, UA: NEGATIVE mg/dL
Hgb urine dipstick: NEGATIVE
Ketones, ur: 20 mg/dL — AB
Leukocytes,Ua: NEGATIVE
Nitrite: NEGATIVE
Protein, ur: NEGATIVE mg/dL
Specific Gravity, Urine: 1.013 (ref 1.005–1.030)
pH: 7 (ref 5.0–8.0)

## 2020-06-04 LAB — GLUCOSE, CAPILLARY
Glucose-Capillary: 94 mg/dL (ref 70–99)
Glucose-Capillary: 86 mg/dL (ref 70–99)
Glucose-Capillary: 94 mg/dL (ref 70–99)

## 2020-06-04 LAB — COMPREHENSIVE METABOLIC PANEL
ALT: 8 U/L (ref 0–44)
AST: 19 U/L (ref 15–41)
Albumin: 3.5 g/dL (ref 3.5–5.0)
Alkaline Phosphatase: 60 U/L (ref 38–126)
Anion gap: 13 (ref 5–15)
BUN: 7 mg/dL (ref 6–20)
CO2: 27 mmol/L (ref 22–32)
Calcium: 9 mg/dL (ref 8.9–10.3)
Chloride: 99 mmol/L (ref 98–111)
Creatinine, Ser: 0.82 mg/dL (ref 0.44–1.00)
GFR, Estimated: >60 mL/min (ref >60)
Glucose, Bld: 87 mg/dL (ref 70–99)
Potassium: 3.2 mmol/L — ABNORMAL LOW (ref 3.5–5.1)
Sodium: 139 mmol/L (ref 135–145)
Total Bilirubin: 0.5 mg/dL (ref 0.3–1.2)
Total Protein: 7 g/dL (ref 6.5–8.1)

## 2020-06-04 LAB — CBC
HCT: 41.3 % (ref 36.0–46.0)
Hemoglobin: 13.9 g/dL (ref 12.0–15.0)
MCH: 33.3 pg (ref 26.0–34.0)
MCHC: 33.7 g/dL (ref 30.0–36.0)
MCV: 99 fL (ref 80.0–100.0)
Platelets: 215 10*3/uL (ref 150–400)
RBC: 4.17 MIL/uL (ref 3.87–5.11)
RDW: 13.3 % (ref 11.5–15.5)
WBC: 8.3 10*3/uL (ref 4.0–10.5)
nRBC: 0 % (ref 0.0–0.2)

## 2020-06-04 LAB — RESP PANEL BY RT-PCR (FLU A&B, COVID) ARPGX2
Influenza A by PCR: NEGATIVE
Influenza B by PCR: NEGATIVE
SARS Coronavirus 2 by RT PCR: NEGATIVE

## 2020-06-04 LAB — ETHANOL: Alcohol, Ethyl (B): <10 mg/dL (ref <10)

## 2020-06-04 LAB — LIPASE, BLOOD: Lipase: 132 U/L — ABNORMAL HIGH (ref 11–51)

## 2020-06-04 MED ORDER — OXYCODONE-ACETAMINOPHEN 5-325 MG PO TABS
1.0000 | ORAL_TABLET | Freq: Four times a day (QID) | ORAL | Status: DC | PRN
  Administered 2020-06-04 – 2020-06-06 (×6): 1 via ORAL
  Filled 2020-06-04 (×6): qty 1

## 2020-06-04 MED ORDER — ACETAMINOPHEN 650 MG RE SUPP: 650.0000 mg | Freq: Four times a day (QID) | RECTAL | Status: DC | PRN

## 2020-06-04 MED ORDER — LORAZEPAM 2 MG/ML IJ SOLN: 1.0000 mg | Freq: Four times a day (QID) | INTRAMUSCULAR | Status: DC | PRN

## 2020-06-04 MED ORDER — ENOXAPARIN SODIUM 60 MG/0.6ML ~~LOC~~ SOLN
50.0000 mg | SUBCUTANEOUS | Status: DC
  Administered 2020-06-04 – 2020-06-07 (×4): 50 mg via SUBCUTANEOUS
  Filled 2020-06-04 (×4): qty 0.6

## 2020-06-04 MED ORDER — POTASSIUM CHLORIDE 10 MEQ/100ML IV SOLN
10.0000 meq | Freq: Once | INTRAVENOUS | Status: AC
  Administered 2020-06-04: 10 meq via INTRAVENOUS
  Filled 2020-06-04: qty 100

## 2020-06-04 MED ORDER — POTASSIUM CHLORIDE IN NACL 20-0.9 MEQ/L-% IV SOLN
INTRAVENOUS | Status: DC
  Filled 2020-06-04 (×5): qty 1000

## 2020-06-04 MED ORDER — LISINOPRIL 20 MG PO TABS
20.0000 mg | ORAL_TABLET | Freq: Every day | ORAL | Status: DC
  Administered 2020-06-04 – 2020-06-07 (×5): 20 mg via ORAL
  Filled 2020-06-04 (×6): qty 1

## 2020-06-04 MED ORDER — OXYCODONE HCL ER 15 MG PO T12A: 15.0000 mg | EXTENDED_RELEASE_TABLET | Freq: Three times a day (TID) | ORAL | Status: DC

## 2020-06-04 MED ORDER — MAGNESIUM SULFATE 2 GM/50ML IV SOLN
2.0000 g | Freq: Once | INTRAVENOUS | Status: AC
  Administered 2020-06-04: 2 g via INTRAVENOUS
  Filled 2020-06-04: qty 50

## 2020-06-04 MED ORDER — LORAZEPAM 2 MG/ML IJ SOLN: 1.0000 mg | INTRAMUSCULAR | Status: DC | PRN

## 2020-06-04 MED ORDER — ONDANSETRON HCL 4 MG/2ML IJ SOLN
4.0000 mg | Freq: Four times a day (QID) | INTRAMUSCULAR | Status: DC | PRN
  Administered 2020-06-04 – 2020-06-05 (×2): 4 mg via INTRAVENOUS
  Filled 2020-06-04 (×2): qty 2

## 2020-06-04 MED ORDER — MONTELUKAST SODIUM 10 MG PO TABS
10.0000 mg | ORAL_TABLET | Freq: Every day | ORAL | Status: DC
  Administered 2020-06-04 – 2020-06-08 (×5): 10 mg via ORAL
  Filled 2020-06-04 (×5): qty 1

## 2020-06-04 MED ORDER — HYDROMORPHONE HCL 1 MG/ML IJ SOLN
1.0000 mg | INTRAMUSCULAR | Status: DC | PRN
  Administered 2020-06-04 – 2020-06-06 (×11): 1 mg via INTRAVENOUS
  Filled 2020-06-04 (×12): qty 1

## 2020-06-04 MED ORDER — THIAMINE HCL 100 MG/ML IJ SOLN
Freq: Once | INTRAVENOUS | Status: AC
  Filled 2020-06-04: qty 1000

## 2020-06-04 MED ORDER — ACETAMINOPHEN 325 MG PO TABS: 650.0000 mg | ORAL_TABLET | Freq: Four times a day (QID) | ORAL | Status: DC | PRN

## 2020-06-04 MED ORDER — MORPHINE SULFATE (PF) 4 MG/ML IV SOLN
4.0000 mg | Freq: Once | INTRAVENOUS | Status: AC
  Administered 2020-06-04: 4 mg via INTRAVENOUS
  Filled 2020-06-04: qty 1

## 2020-06-04 MED ORDER — MORPHINE SULFATE (PF) 2 MG/ML IV SOLN
2.0000 mg | INTRAVENOUS | Status: DC | PRN
  Administered 2020-06-04: 09:00:00 2 mg via INTRAVENOUS
  Filled 2020-06-04: qty 1

## 2020-06-04 MED ORDER — OXYCODONE-ACETAMINOPHEN 5-325 MG PO TABS
1.0000 | ORAL_TABLET | Freq: Three times a day (TID) | ORAL | Status: DC | PRN
  Administered 2020-06-04: 04:00:00 1 via ORAL
  Filled 2020-06-04: qty 1

## 2020-06-04 MED ORDER — MOMETASONE FURO-FORMOTEROL FUM 200-5 MCG/ACT IN AERO
2.0000 | INHALATION_SPRAY | Freq: Two times a day (BID) | RESPIRATORY_TRACT | Status: DC
  Administered 2020-06-04 – 2020-06-07 (×8): 2 via RESPIRATORY_TRACT
  Filled 2020-06-04: qty 8.8

## 2020-06-04 MED ORDER — ALBUTEROL SULFATE (2.5 MG/3ML) 0.083% IN NEBU: 2.5000 mg | INHALATION_SOLUTION | RESPIRATORY_TRACT | Status: DC | PRN

## 2020-06-04 MED ORDER — ONDANSETRON HCL 4 MG PO TABS: 4.0000 mg | ORAL_TABLET | Freq: Four times a day (QID) | ORAL | Status: DC | PRN

## 2020-06-04 MED ORDER — SUCRALFATE 1 G PO TABS
1.0000 g | ORAL_TABLET | Freq: Four times a day (QID) | ORAL | Status: DC
  Administered 2020-06-04 – 2020-06-08 (×16): 1 g via ORAL
  Filled 2020-06-04 (×16): qty 1

## 2020-06-04 MED ORDER — AMLODIPINE BESYLATE 5 MG PO TABS
5.0000 mg | ORAL_TABLET | Freq: Every day | ORAL | Status: DC
  Administered 2020-06-04 – 2020-06-08 (×5): 5 mg via ORAL
  Filled 2020-06-04 (×6): qty 1

## 2020-06-04 MED ORDER — OXYCODONE ER 13.5 MG PO C12A: 1.0000 | EXTENDED_RELEASE_CAPSULE | Freq: Three times a day (TID) | ORAL | Status: DC

## 2020-06-04 MED ORDER — CYCLOBENZAPRINE HCL 10 MG PO TABS
10.0000 mg | ORAL_TABLET | Freq: Three times a day (TID) | ORAL | Status: DC
  Administered 2020-06-04 – 2020-06-08 (×13): 10 mg via ORAL
  Filled 2020-06-04 (×13): qty 1

## 2020-06-04 MED ORDER — ONDANSETRON HCL 4 MG PO TABS: 4.0000 mg | ORAL_TABLET | Freq: Three times a day (TID) | ORAL | Status: DC | PRN

## 2020-06-04 MED ORDER — MAGNESIUM HYDROXIDE 400 MG/5ML PO SUSP
30.0000 mL | Freq: Every day | ORAL | Status: DC | PRN
  Filled 2020-06-04: qty 30

## 2020-06-04 MED ORDER — TRAZODONE HCL 50 MG PO TABS
25.0000 mg | ORAL_TABLET | Freq: Every evening | ORAL | Status: DC | PRN
  Administered 2020-06-05 – 2020-06-07 (×3): 25 mg via ORAL
  Filled 2020-06-04 (×3): qty 1

## 2020-06-04 NOTE — ED Provider Notes (Signed)
[facility] Emergency Department Provider Note  ____________________________________________   First MD Initiated Contact with Patient 06/03/20 2307     (approximate)  I have reviewed the triage vital signs and the nursing notes.   HISTORY  Chief Complaint Pancreatitis    HPI Sarah Sosa is a 39 y.o. female    with medical history as listed below which notably includes chronic pain disorder and recurrent pancreatitis.  She presents tonight with worsening abdominal pain, nausea, and vomiting since she was last seen in the emergency department 2 days ago.  At that time she was diagnosed with recurrent pancreatitis based on elevated lipase as well as a CT scan which showed uncomplicated pancreatitis.  However she felt better after some medication and fluids and was offered the opportunity for admission but declined in favor outpatient management.  However she has not been doing well since going home with persistent generalized sharp abdominal pain, nausea, and vomiting.  She said that she has been able to drink some chicken broth but mostly everything comes back up.  She denies any alcohol use since last week which was apparently what started this most recent case of pancreatitis.  Her symptoms are severe nothing particular makes them better or worse.  She denies fever/chills, sore throat, chest pain, shortness of breath, and dysuria.  She denies drug use except for marijuana which she says she smokes daily or at least every other day.        Past Medical History:  Diagnosis Date  . Arthritis   . Asthma   . Chronic pain disorder 03/19/2018  . GERD (gastroesophageal reflux disease)   . Headache   . Heart murmur   . History of trichomoniasis 03/19/2018  . Hypertension   . Pancreatitis   . PID (pelvic inflammatory disease) 02/14/2018  . Uterine leiomyoma 03/19/2018    Patient Active Problem List   Diagnosis Date Noted  . Recurrent pancreatitis 06/04/2020  .  Hypertensive urgency 04/03/2020  . Hypokalemia 04/03/2020  . Tobacco abuse 04/03/2020  . History of substance abuse ([place]) 04/03/2020  . Acute pancreatitis 12/09/2019  . Ileus ([place]) 04/21/2018  . Postoperative ileus ([place]) 04/20/2018  . Postoperative state 04/15/2018  . Uterine leiomyoma 03/19/2018  . Menorrhagia with regular cycle 03/19/2018  . Chronic pain disorder 03/19/2018  . Pelvic pain 03/19/2018  . Essential hypertension 03/19/2018    Past Surgical History:  Procedure Laterality Date  . ABDOMINAL HYSTERECTOMY    . CHOLECYSTECTOMY    . DENTAL SURGERY    . HYSTERECTOMY ABDOMINAL WITH SALPINGECTOMY Bilateral 04/15/2018   Procedure: HYSTERECTOMY ABDOMINAL WITH BILATERAL SALPINGECTOMY;  Surgeon: Rubie Maid, MD;  Location: [facility] ORS;  Service: Gynecology;  Laterality: Bilateral;  . kenn surgery Bilateral   . KNEE ARTHROSCOPY Left 2012, 2013    Prior to Admission medications   Medication Sig Start Date End Date Taking? Authorizing Provider  amLODipine (NORVASC) 10 MG tablet Take 0.5 tablets (5 mg total) by mouth daily. 04/05/20 05/05/20  Nolberto Hanlon, MD  cyclobenzaprine (FLEXERIL) 10 MG tablet Take 10 mg by mouth 3 (three) times daily. 04/27/20   [provider]  lisinopril (ZESTRIL) 20 MG tablet Take 1 tablet (20 mg total) by mouth 2 (two) times daily at 10 AM and 5 PM. Patient taking differently: Take 20 mg by mouth at bedtime.  04/05/20 05/05/20  Nolberto Hanlon, MD  montelukast (SINGULAIR) 10 MG tablet Take 10 mg by mouth daily. 03/30/20   [provider]  ondansetron (ZOFRAN) 4  MG tablet Take 1 tablet (4 mg total) by mouth every 8 (eight) hours as needed for up to 10 doses for nausea or vomiting. 06/01/20   Lucrezia Starch, MD  Oxycodone HCl 10 MG TABS Take 10 mg by mouth every 6 (six) hours. 04/01/20   [provider]  oxyCODONE-acetaminophen (PERCOCET) 5-325 MG tablet Take 1 tablet by mouth every 8 (eight) hours as needed for up to 5 days for severe  pain. 06/01/20 06/06/20  Lucrezia Starch, MD  PROAIR HFA 108 [phone] Base) MCG/ACT inhaler Inhale 2 puffs into the lungs every 4 (four) hours as needed. 03/30/20   [provider]  sucralfate (CARAFATE) 1 g tablet Take 1 tablet (1 g total) by mouth 4 (four) times daily for 15 days. 04/30/20 05/15/20  Lavonia Drafts, MD  SYMBICORT 160-4.5 MCG/ACT inhaler Inhale 2 puffs into the lungs 2 (two) times daily. 03/30/20   [provider]    Allergies Latex, Nsaids, Tomato, and Tramadol  Family History  Problem Relation Age of Onset  . Fibroids Mother   . Hypertension Mother   . Arthritis Mother     Social History Social History   Tobacco Use  . Smoking status: Current Some Day Smoker    Packs/day: 0.50    Years: 22.00    Pack years: 11.00    Types: Cigarettes  . Smokeless tobacco: Never Used  Vaping Use  . Vaping Use: Never used  Substance Use Topics  . Alcohol use: Yes    Comment: occasional  . Drug use: Yes    Types: Marijuana    Review of Systems Constitutional: No fever/chills Eyes: No visual changes. ENT: No sore throat. Cardiovascular: Denies chest pain. Respiratory: Denies shortness of breath. Gastrointestinal: Generalized abdominal pain with nausea and vomiting. Genitourinary: Negative for dysuria. Musculoskeletal: Negative for neck pain.  Negative for back pain. Integumentary: Negative for rash. Neurological: Negative for headaches, focal weakness or numbness.   ____________________________________________   PHYSICAL EXAM:  VITAL SIGNS: ED Triage Vitals  Enc Vitals Group     BP 06/03/20 2039 (!) 161/112     Pulse Rate 06/03/20 2039 92     Resp 06/03/20 2039 18     Temp 06/03/20 2039 97.9 F (36.6 C)     Temp Source 06/03/20 2039 Oral     SpO2 06/03/20 2039 100 %     Weight 06/03/20 2041 96.2 kg (212 lb)     Height 06/03/20 2041 1.676 m (5\' 6" )     Head Circumference --      Peak Flow --      Pain Score 06/03/20 2041 10     Pain Loc --       Pain Edu? --      Excl. in Wing? --     Constitutional: Alert and oriented.  Moaning in pain and appears very uncomfortable. Eyes: Conjunctivae are normal.  Head: Atraumatic. Nose: No congestion/rhinnorhea. Mouth/Throat: Patient is wearing a mask. Neck: No stridor.  No meningeal signs.   Cardiovascular: Normal rate, regular rhythm. Good peripheral circulation. Grossly normal heart sounds. Respiratory: Normal respiratory effort.  No retractions. Gastrointestinal: Soft with generalized tenderness throughout the abdomen.  Holding an emesis bag but has not vomited in front of me. Musculoskeletal: No lower extremity tenderness nor edema. No gross deformities of extremities. Neurologic:  Normal speech and language. No gross focal neurologic deficits are appreciated.  Skin:  Skin is warm, dry and intact. Psychiatric: Mood and affect are normal. Speech and behavior  are normal.  ____________________________________________   LABS (all labs ordered are listed, but only abnormal results are displayed)  Labs Reviewed  LIPASE, BLOOD - Abnormal; Notable for the following components:      Result Value   Lipase 197 (*)    All other components within normal limits  COMPREHENSIVE METABOLIC PANEL - Abnormal; Notable for the following components:   Potassium 3.0 (*)    All other components within normal limits  CBC - Abnormal; Notable for the following components:   Hemoglobin 15.8 (*)    HCT 46.9 (*)    All other components within normal limits  MAGNESIUM - Abnormal; Notable for the following components:   Magnesium 1.4 (*)    All other components within normal limits  RESP PANEL BY RT-PCR (FLU A&B, COVID) ARPGX2  URINALYSIS, COMPLETE (UACMP) WITH MICROSCOPIC  ETHANOL   ____________________________________________  EKG  No indication for EKG tonight, EKG from 1 to 2 days ago reviewed, verified QTc interval less than 500 ms. ____________________________________________  RADIOLOGY Ursula Alert, personally viewed and evaluated these images (plain radiographs) as part of my medical decision making, as well as reviewing the written report by the radiologist.  ED MD interpretation: No indication for emergent imaging tonight  Official radiology report(s): No results found.  ____________________________________________   PROCEDURES   Procedure(s) performed (including Critical Care):  Procedures   ____________________________________________   INITIAL IMPRESSION / MDM / [place] / ED COURSE  As part of my medical decision making, I reviewed the following data within the [place] notes reviewed and incorporated, Labs reviewed , Old EKG reviewed, Old chart reviewed, Discussed with admitting physician , Notes from prior ED visits and [place] Controlled Substance Database   Differential diagnosis includes, but is not limited to, pancreatitis, cannabinoid hyperemesis syndrome, electrolyte or metabolic abnormality, less likely acute infection.  The patient tried outpatient management 2 days ago but has not been successful.  Lipase has improved but clinically the patient is still in a lot of pain and reports vomiting almost continuously particularly with any attempted oral intake.  She is status post cholecystectomy and there is no indication for an ultrasound.  She had a CT scan a couple of days ago which showed some stranding but no complications such as pseudocyst.  No indication for repeat imaging tonight.  Patient has magnesium of 1.4, lipase 197 which is down from prior, essentially normal CBC other than some hemoconcentration as indicated by a hemoglobin of 15.8.  Comprehensive metabolic panel is normal other than a potassium of 3.0.  I added on an ethanol level though the patient reports no recent alcohol intake, I ordered just to be sure.  I also ordered magnesium 2 g IV, potassium 40 mEq by mouth and 10 mEq by IV, droperidol 2.5 mg  IV which has been successful for her in the past.  She may need additional analgesia.  The patient said that she feels like she needs to stay and based on the failure of outpatient management I agree with this plan.  I will consult the hospitalist for admission.       Clinical Course as of Jun 04 99  Fri Jun 04, 2020  0057 Discussed case by phone with Dr. Sidney Ace who will admit.   [CF]    Clinical Course User Index [CF] Hinda Kehr, MD     ____________________________________________  FINAL CLINICAL IMPRESSION(S) / ED DIAGNOSES  Final diagnoses:  Alcohol-induced acute pancreatitis, unspecified complication status  Intractable nausea and vomiting  Marijuana use     MEDICATIONS GIVEN DURING THIS VISIT:  Medications  magnesium sulfate IVPB 2 g 50 mL (2 g Intravenous New Bag/Given 06/04/20 0045)  lactated ringers bolus 1,000 mL (1,000 mLs Intravenous New Bag/Given 06/04/20 0040)  potassium chloride (KLOR-CON) packet 40 mEq (40 mEq Oral Given 06/04/20 0045)  droperidol (INAPSINE) 2.5 MG/ML injection 2.5 mg (2.5 mg Intravenous Given 06/04/20 0042)     ED Discharge Orders    None      *Please note:  Sarah Sosa was evaluated in Emergency Department on 06/04/2020 for the symptoms described in the history of present illness. She was evaluated in the context of the global COVID-19 pandemic, which necessitated consideration that the patient might be at risk for infection with the SARS-CoV-2 virus that causes COVID-19. Institutional protocols and algorithms that pertain to the evaluation of patients at risk for COVID-19 are in a state of rapid change based on information released by regulatory bodies including the CDC and federal and state organizations. These policies and algorithms were followed during the patient's care in the ED.  Some ED evaluations and interventions may be delayed as a result of limited staffing during and after the pandemic.*  Note:  This document was  prepared using Dragon voice recognition software and may include unintentional dictation errors.   Hinda Kehr, MD 06/04/20 0100

## 2020-06-04 NOTE — ED Notes (Signed)
Medications not given at this time, waiting for IV team to arrive.

## 2020-06-04 NOTE — TOC Initial Note (Signed)
Transition of Care [facility]) - Initial/Assessment Note    Patient Details  Name: Sarah Sosa MRN: [id number] Date of Birth: 21-Dec-1980  Transition of [facility]) CM/SW Contact:    Shelbie Hutching, RN Phone Number: 06/04/2020, 11:25 AM  Clinical Narrative:                 High risk readmission screen completed.  RNCM provided patient with substance abuse resources at the bedside.  Patient endorses alcohol use but does not disclose how much she consumes.  Patient is not very engaged during meeting, encouraged her to reach out with questions.   Expected Discharge Plan: Home/Self Care Barriers to Discharge: Continued Medical Work up   Patient Goals and CMS Choice Patient states their goals for this hospitalization and ongoing recovery are:: patient did not voice goals      Expected Discharge Plan and Services Expected Discharge Plan: Home/Self Care       Living arrangements for the past 2 months: Single Family Home                   DME Agency: NA       HH Arranged: NA          Prior Living Arrangements/Services Living arrangements for the past 2 months: Single Family Home Lives with:: Minor Children, Adult Children Patient language and need for interpreter reviewed:: Yes Do you feel safe going back to the place where you live?: Yes      Need for Family Participation in Patient Care: No (Comment) Care giver support system in place?: No (comment)   Criminal Activity/Legal Involvement Pertinent to Current Situation/Hospitalization: No - Comment as needed  Activities of Daily Living Home Assistive Devices/Equipment: None ADL Screening (condition at time of admission) Patient's cognitive ability adequate to safely complete daily activities?: Yes Is the patient deaf or have difficulty hearing?: No Does the patient have difficulty seeing, even when wearing glasses/contacts?: No Does the patient have difficulty concentrating, remembering, or making decisions?: No Patient  able to express need for assistance with ADLs?: Yes Does the patient have difficulty dressing or bathing?: No Independently performs ADLs?: Yes (appropriate for developmental age) Does the patient have difficulty walking or climbing stairs?: No Weakness of Legs: Both Weakness of Arms/Hands: None  Permission Sought/Granted   Permission granted to share information with : No              Emotional Assessment Appearance:: Appears stated age Attitude/Demeanor/Rapport: Avoidant Affect (typically observed): Blunt, Irritable Orientation: : Oriented to Self, Oriented to Place, Oriented to  Time, Oriented to Situation Alcohol / Substance Use: Alcohol Use Psych Involvement: No (comment)  Admission diagnosis:  Recurrent pancreatitis [K85.90] Intractable nausea and vomiting [R11.2] Marijuana use [F12.90] Alcohol-induced acute pancreatitis, unspecified complication status [A83.41] Patient Active Problem List   Diagnosis Date Noted  . Recurrent pancreatitis 06/04/2020  . Hypertensive urgency 04/03/2020  . Hypokalemia 04/03/2020  . Tobacco abuse 04/03/2020  . History of substance abuse ([place]) 04/03/2020  . Acute pancreatitis 12/09/2019  . Ileus ([place]) 04/21/2018  . Postoperative ileus ([place]) 04/20/2018  . Postoperative state 04/15/2018  . Uterine leiomyoma 03/19/2018  . Menorrhagia with regular cycle 03/19/2018  . Chronic pain disorder 03/19/2018  . Pelvic pain 03/19/2018  . Essential hypertension 03/19/2018   PCP:  [name], MD Pharmacy:   [place], [place], [place] [address] [place] [address] Phone: [phone] Fax: [phone]  [place] [phone] -  [place], [place] - [address] [place] [address] Phone: [phone] Fax: [phone]     Social Determinants of Health (SDOH) Interventions    Readmission Risk Interventions Readmission Risk Prevention Plan 06/04/2020  Transportation Screening Complete   PCP or Specialist Appt within 3-5 Days Complete  HRI or [place] Complete  Social Work Consult for [place] Planning/Counseling Complete  Palliative Care Screening Not Applicable  Medication Review Press photographer) Complete  Some recent data might be hidden

## 2020-06-04 NOTE — Progress Notes (Signed)
[place] [facility] [place] NAME: Sarah Sosa    MR#:  [id number]  DATE OF BIRTH:  Apr 02, 1981  SUBJECTIVE:  patient complains of abdominal pain. She tells me she is not drinking as much. Although does have significant history of chronic alcohol use reviewing old records. Wants to try clear liquid diet.  REVIEW OF SYSTEMS:   Review of Systems  Constitutional: Negative for chills, fever and weight loss.  HENT: Negative for ear discharge, ear pain and nosebleeds.   Eyes: Negative for blurred vision, pain and discharge.  Respiratory: Negative for sputum production, shortness of breath, wheezing and stridor.   Cardiovascular: Negative for chest pain, palpitations, orthopnea and PND.  Gastrointestinal: Positive for abdominal pain. Negative for diarrhea, nausea and vomiting.  Genitourinary: Negative for frequency and urgency.  Musculoskeletal: Negative for back pain and joint pain.  Neurological: Negative for sensory change, speech change, focal weakness and weakness.  Psychiatric/Behavioral: Negative for depression and hallucinations. The patient is not nervous/anxious.    Tolerating Diet: Tolerating PT:   DRUG ALLERGIES:   Allergies  Allergen Reactions  . Latex Anaphylaxis, Hives and Itching    Other reaction(s): Other (See Comments) SKIN BURNING & PEELING   . Nsaids Other (See Comments)    Serve ulcers; stomach aches, GERD.   . Tomato Anaphylaxis, Hives and Itching  . Tramadol Itching    VITALS:  Blood pressure (!) 183/85, pulse 76, temperature 98.5 F (36.9 C), temperature source Oral, resp. rate 18, height 5\' 6"  (1.676 m), weight 91.2 kg, last menstrual period 03/25/2018, SpO2 96 %.  PHYSICAL EXAMINATION:   Physical Exam  GENERAL:  39 y.o.-year-old patient lying in the bed with mildacute distress.  HEENT: Head atraumatic, normocephalic. Oropharynx and nasopharynx clear.  NECK:  Supple, no jugular venous distention. No thyroid  enlargement, no tenderness.  LUNGS: Normal breath sounds bilaterally, no wheezing, rales, rhonchi. No use of accessory muscles of respiration.  CARDIOVASCULAR: S1, S2 normal. No murmurs, rubs, or gallops.  ABDOMEN: Soft, diffusely tender, nondistended. Bowel sounds present. No organomegaly or mass.  EXTREMITIES: No cyanosis, clubbing or edema b/l.    NEUROLOGIC: Cranial nerves II through XII are intact. No focal Motor or sensory deficits b/l.   PSYCHIATRIC:  patient is alert and oriented x 3.  SKIN: No obvious rash, lesion, or ulcer.   LABORATORY PANEL:  CBC Recent Labs  Lab 06/04/20 0432  WBC 8.3  HGB 13.9  HCT 41.3  PLT 215    Chemistries  Recent Labs  Lab 06/03/20 2157 06/03/20 2157 06/04/20 0432  NA 139   < > 139  K 3.0*   < > 3.2*  CL 99   < > 99  CO2 27   < > 27  GLUCOSE 99   < > 87  BUN 8   < > 7  CREATININE 0.94   < > 0.82  CALCIUM 9.4   < > 9.0  MG 1.4*  --   --   AST 23   < > 19  ALT 8   < > 8  ALKPHOS 68   < > 60  BILITOT 0.7   < > 0.5   < > = values in this interval not displayed.   Cardiac Enzymes No results for input(s): TROPONINI in the last 168 hours. RADIOLOGY:  No results found. ASSESSMENT AND PLAN:  Sarah Sosa  is a 39 y.o. Caucasian female with a known history of asthma, hypertension, chronic pain syndrome,  pancreatitis and GERD, who presented to the emergency room with acute onset of epigastric and right upper quadrant abdominal pain as well as nausea and vomiting which have been recurrent over the last week. Abdominal and pelvic CT scan 2 days ago revealed acute pancreatitis with mild diffuse fatty infiltration of the liver and 5 cm left ovarian cyst.  1.  Recurrent acute pancreatitis likely alcoholic with intractable nausea and vomiting with abdominal pain. -IV fluids -- patient reports morphine not working. Will switch to Dilaudid. Patient has history of substance abuse and possible opioid overdose according to your records. Will continue  to monitor closely. -Start clear liquid diet. Patient agreeable to it. Lipase down to 132. -- Patient advised on alcohol abstinence.   2.  Hypokalemia and hypomagnesemia. -Received IV potassium and mag  3.  Polysubstance abuse including alcohol abuse, tobacco abuse and marijuana abuse.  Marijuana abuse could be early contributing to her hyperemesis. -she was counseled for cessation and will be monitored for alcohol withdrawal. -- Currently does not seem to be exhibiting signs in terms of withdrawal.  4.  Hypertension. -- continue Zestril and amlodipine.  5.  Asthma. -continue Singulair and Symbicort as well as as needed albuterol..  6.  DVT prophylaxis. Subcutaneous Lovenox   Procedures: Family communication : none Consults : Education officer, museum CODE STATUS: full DVT Prophylaxis : Lovenox  Status is: Inpatient  Remains inpatient appropriate because:Ongoing active pain requiring inpatient pain management   Dispo: The patient is from: Home              Anticipated d/c is to: Home              Anticipated d/c date is: 1 day              Patient currently is not medically stable to d/c.  Admitted with alcohol induced pancreatitis. Continues with some pain. Will start clear liquid diet and see how patient does.      TOTAL TIME TAKING CARE OF THIS PATIENT: 25 minutes.  >50% time spent on counselling and coordination of care  Note: This dictation was prepared with Dragon dictation along with smaller phrase technology. Any transcriptional errors that result from this process are unintentional.  [name]    [facility]   CC: Primary care physician; [name], MDPatient ID: Sarah Sosa, female   DOB: 1980-08-05, 39 y.o.   MRN: [id number]

## 2020-06-04 NOTE — Plan of Care (Deleted)
  Problem: Pain Managment: Goal: General experience of comfort will improve 06/04/2020 1439 by Reche Dixon, RN Outcome: Progressing 06/04/2020 1434 by Reche Dixon, RN Outcome: Progressing

## 2020-06-04 NOTE — Progress Notes (Signed)
Anticoagulation monitoring(Lovenox):  39 yo female ordered Lovenox 40 mg Q24h  Filed Weights   06/03/20 2041  Weight: 96.2 kg (212 lb)   BMI 34   Lab Results  Component Value Date   CREATININE 0.94 06/03/2020   CREATININE 0.81 06/01/2020   CREATININE 0.79 04/30/2020   Estimated Creatinine Clearance: 94.9 mL/min (by C-G formula based on SCr of 0.94 mg/dL). Hemoglobin & Hematocrit     Component Value Date/Time   HGB 15.8 (H) 06/03/2020 2157   HCT 46.9 (H) 06/03/2020 2157     Per Protocol for Patient with estCrcl > 30 ml/min and BMI > 30, will transition to Lovenox 50 mg Q24h.

## 2020-06-04 NOTE — H&P (Addendum)
[place]   PATIENT NAME: Sarah Sosa    MR#:  [id number]  DATE OF BIRTH:  1981/02/01  DATE OF ADMISSION:  06/03/2020  PRIMARY CARE PHYSICIAN: [name], MD   REQUESTING/REFERRING PHYSICIAN: Hinda Kehr, MD  CHIEF COMPLAINT:   Chief Complaint  Patient presents with   Pancreatitis    HISTORY OF PRESENT ILLNESS:  Sarah Sosa  is a 39 y.o. Caucasian female with a known history of asthma, hypertension, chronic pain syndrome, pancreatitis and GERD, who presented to the emergency room with acute onset of epigastric and right upper quadrant abdominal pain as well as nausea and vomiting which have been recurrent over the last week.  Her last bowel movement was about a week ago.  She denies any fever or chills.  No chest pain or dyspnea or cough or wheezing.  No dysuria, oliguria or hematuria or flank pain.  She has been seen in the ER 2 days ago and was managed her pain.  She declined admission and was therefore discharged.  She admitted to smoking cigarettes and marijuana.  Upon presentation to the emergency room, blood pressure was 161/112 with otherwise normal vital signs.  Labs revealed hypokalemia of 3 and hypomagnesemia of 1.4 with serum lipase of 197 down from 587 on 11/16.  Anion gap was 13.  CBC showed hemoconcentration.  COVID-19 PCR and influenza antigens came back negative.  Alcohol level was less than 10.  The patient stated that her last alcoholic drink was on Saturday.  Abdominal and pelvic CT scan 2 days ago revealed acute pancreatitis with mild diffuse fatty infiltration of the liver and 5 cm left ovarian cyst.  The patient was given 10 mEq of IV potassium chloride and 40 mill: P.o. as well as 4 mg of IV morphine sulfate 1 L bolus of IV lactated Ringer and 2.5 mg IV Inapsine.  She will be admitted to a medical bed for further evaluation and management.  PAST MEDICAL HISTORY:   Past Medical History:  Diagnosis Date   Arthritis    Asthma    Chronic  pain disorder 03/19/2018   GERD (gastroesophageal reflux disease)    Headache    Heart murmur    History of trichomoniasis 03/19/2018   Hypertension    Pancreatitis    PID (pelvic inflammatory disease) 02/14/2018   Uterine leiomyoma 03/19/2018    PAST SURGICAL HISTORY:   Past Surgical History:  Procedure Laterality Date   ABDOMINAL HYSTERECTOMY     CHOLECYSTECTOMY     DENTAL SURGERY     HYSTERECTOMY ABDOMINAL WITH SALPINGECTOMY Bilateral 04/15/2018   Procedure: HYSTERECTOMY ABDOMINAL WITH BILATERAL SALPINGECTOMY;  Surgeon: Rubie Maid, MD;  Location: [facility] ORS;  Service: Gynecology;  Laterality: Bilateral;   kenn surgery Bilateral    KNEE ARTHROSCOPY Left 2012, 2013    SOCIAL HISTORY:   Social History   Tobacco Use   Smoking status: Current Some Day Smoker    Packs/day: 0.50    Years: 22.00    Pack years: 11.00    Types: Cigarettes   Smokeless tobacco: Never Used  Substance Use Topics   Alcohol use: Yes    Comment: occasional    FAMILY HISTORY:   Family History  Problem Relation Age of Onset   Fibroids Mother    Hypertension Mother    Arthritis Mother     DRUG ALLERGIES:   Allergies  Allergen Reactions   Latex Anaphylaxis, Hives and Itching    Other reaction(s): Other (See Comments) SKIN  BURNING & PEELING    Nsaids Other (See Comments)    Serve ulcers; stomach aches, GERD.    Tomato Anaphylaxis, Hives and Itching   Tramadol Itching    REVIEW OF SYSTEMS:   ROS As per history of present illness. All pertinent systems were reviewed above. Constitutional, HEENT, cardiovascular, respiratory, GI, GU, musculoskeletal, neuro, psychiatric, endocrine, integumentary and hematologic systems were reviewed and are otherwise negative/unremarkable except for positive findings mentioned above in the HPI.   MEDICATIONS AT HOME:   Prior to Admission medications   Medication Sig Start Date End Date Taking? Authorizing Provider  cyclobenzaprine  (FLEXERIL) 10 MG tablet Take 10 mg by mouth 3 (three) times daily. 04/27/20  Yes [provider]  lisinopril (ZESTRIL) 20 MG tablet Take 1 tablet (20 mg total) by mouth 2 (two) times daily at 10 AM and 5 PM. Patient taking differently: Take 20 mg by mouth at bedtime.  04/05/20 06/04/20 Yes Nolberto Hanlon, MD  montelukast (SINGULAIR) 10 MG tablet Take 10 mg by mouth daily. 03/30/20  Yes [provider]  ondansetron (ZOFRAN) 4 MG tablet Take 1 tablet (4 mg total) by mouth every 8 (eight) hours as needed for up to 10 doses for nausea or vomiting. 06/01/20  Yes Lucrezia Starch, MD  oxyCODONE-acetaminophen (PERCOCET) 5-325 MG tablet Take 1 tablet by mouth every 8 (eight) hours as needed for up to 5 days for severe pain. 06/01/20 06/06/20 Yes Lucrezia Starch, MD  PROAIR HFA 108 [phone] Base) MCG/ACT inhaler Inhale 2 puffs into the lungs every 4 (four) hours as needed. 03/30/20  Yes [provider]  SYMBICORT 160-4.5 MCG/ACT inhaler Inhale 2 puffs into the lungs 2 (two) times daily. 03/30/20  Yes [provider]  XTAMPZA ER 13.5 MG C12A Take 1 capsule by mouth every 8 (eight) hours. 06/01/20  Yes [provider]  amLODipine (NORVASC) 10 MG tablet Take 0.5 tablets (5 mg total) by mouth daily. 04/05/20 05/05/20  Nolberto Hanlon, MD  Oxycodone HCl 10 MG TABS Take 10 mg by mouth every 6 (six) hours. Patient not taking: Reported on 06/04/2020 04/01/20   [provider]  sucralfate (CARAFATE) 1 g tablet Take 1 tablet (1 g total) by mouth 4 (four) times daily for 15 days. 04/30/20 05/15/20  Lavonia Drafts, MD      VITAL SIGNS:  Blood pressure (!) 166/95, pulse 79, temperature 97.9 F (36.6 C), temperature source Oral, resp. rate 18, height 5\' 6"  (1.676 m), weight 96.2 kg, last menstrual period 03/25/2018, SpO2 95 %.  PHYSICAL EXAMINATION:  Physical Exam  GENERAL:  39 y.o.-year-old Caucasian female patient lying in the bed with mild distress from pain. EYES: Pupils  equal, round, reactive to light and accommodation. No scleral icterus. Extraocular muscles intact.  HEENT: Head atraumatic, normocephalic. Oropharynx and nasopharynx clear.  NECK:  Supple, no jugular venous distention. No thyroid enlargement, no tenderness.  LUNGS: Normal breath sounds bilaterally, no wheezing, rales,rhonchi or crepitation. No use of accessory muscles of respiration.  CARDIOVASCULAR: Regular rate and rhythm, S1, S2 normal. No murmurs, rubs, or gallops.  ABDOMEN: Soft, nondistended with epigastric and right upper quadrant abdominal tenderness without rebound tenderness guarding or rigidity.  Bowel sounds present. No organomegaly or mass.  EXTREMITIES: No pedal edema, cyanosis, or clubbing.  NEUROLOGIC: Cranial nerves II through XII are intact. Muscle strength 5/5 in all extremities. Sensation intact. Gait not checked.  PSYCHIATRIC: The patient is alert and oriented x 3.  Normal affect and good eye contact. SKIN: No  obvious rash, lesion, or ulcer.   LABORATORY PANEL:   CBC Recent Labs  Lab 06/03/20 2157  WBC 10.5  HGB 15.8*  HCT 46.9*  PLT 237   ------------------------------------------------------------------------------------------------------------------  Chemistries  Recent Labs  Lab 06/03/20 2157  NA 139  K 3.0*  CL 99  CO2 27  GLUCOSE 99  BUN 8  CREATININE 0.94  CALCIUM 9.4  MG 1.4*  AST 23  ALT 8  ALKPHOS 68  BILITOT 0.7   ------------------------------------------------------------------------------------------------------------------  Cardiac Enzymes No results for input(s): TROPONINI in the last 168 hours. ------------------------------------------------------------------------------------------------------------------  RADIOLOGY:  No results found.    IMPRESSION AND PLAN:   1.  Recurrent acute pancreatitis likely alcoholic with intractable nausea and vomiting with abdominal pain. -The patient will be admitted to a medical bed. -Pain  management will be provided. -She will be kept n.p.o. -We will follow serial lipase levels.  2.  Hypokalemia and hypomagnesemia. -Potassium and magnesium will be replaced.  3.  Polysubstance abuse including alcohol abuse, tobacco abuse and marijuana abuse.  Marijuana abuse could be early contributing to her hyperemesis. -He was counseled for cessation and will be monitored for alcohol withdrawal. -Banana bag will be given daily  4.  Hypertension. -We will continue Zestril and amlodipine.  5.  Asthma. -We will continue Singulair and Symbicort as well as as needed albuterol..  6.  DVT prophylaxis. Subcutaneous Lovenox   All the records are reviewed and case discussed with ED provider. The plan of care was discussed in details with the patient (and family). I answered all questions. The patient agreed to proceed with the above mentioned plan. Further management will depend upon hospital course.   CODE STATUS: Full code  Status is: Inpatient  Remains inpatient appropriate because:Ongoing active pain requiring inpatient pain management, Ongoing diagnostic testing needed not appropriate for outpatient work up, Unsafe d/c plan, IV treatments appropriate due to intensity of illness or inability to take PO and Inpatient level of care appropriate due to severity of illness   Dispo: The patient is from: Home              Anticipated d/c is to: Home              Anticipated d/c date is: 2 days              Patient currently is not medically stable to d/c.   TOTAL TIME TAKING CARE OF THIS PATIENT: 55 minutes.    Christel Mormon M.D on 06/04/2020 at 1:56 AM  [facility]   From 7 PM-7 AM, contact night-coverage [email or web address]  CC: Primary care physician; [name], MD

## 2020-06-04 NOTE — Plan of Care (Signed)
  Problem: Education: Goal: Knowledge of Pancreatitis treatment and prevention will improve Outcome: Progressing   Problem: Health Behavior/Discharge Planning: Goal: Ability to formulate a plan to maintain an alcohol-free life will improve Outcome: Progressing   Problem: Nutritional: Goal: Ability to achieve adequate nutritional intake will improve Outcome: Progressing   Problem: Clinical Measurements: Goal: Complications related to the disease process, condition or treatment will be avoided or minimized Outcome: Progressing   Problem: Education: Goal: Knowledge of General Education information will improve Description: Including pain rating scale, medication(s)/side effects and non-pharmacologic comfort measures Outcome: Progressing   Problem: Health Behavior/Discharge Planning: Goal: Ability to manage health-related needs will improve Outcome: Progressing   Problem: Clinical Measurements: Goal: Ability to maintain clinical measurements within normal limits will improve Outcome: Progressing Goal: Will remain free from infection Outcome: Progressing Goal: Diagnostic test results will improve Outcome: Progressing Goal: Respiratory complications will improve Outcome: Progressing Goal: Cardiovascular complication will be avoided Outcome: Progressing   Problem: Activity: Goal: Risk for activity intolerance will decrease Outcome: Progressing   Problem: Nutrition: Goal: Adequate nutrition will be maintained Outcome: Progressing   Problem: Coping: Goal: Level of anxiety will decrease Outcome: Progressing   Problem: Elimination: Goal: Will not experience complications related to bowel motility Outcome: Progressing Goal: Will not experience complications related to urinary retention Outcome: Progressing   Problem: Pain Managment: Goal: General experience of comfort will improve Outcome: Progressing   Problem: Safety: Goal: Ability to remain free from injury will  improve Outcome: Progressing   Problem: Skin Integrity: Goal: Risk for impaired skin integrity will decrease Outcome: Progressing

## 2020-06-05 DIAGNOSIS — I10 Essential (primary) hypertension: Secondary | ICD-10-CM | POA: Diagnosis not present

## 2020-06-05 DIAGNOSIS — K852 Alcohol induced acute pancreatitis without necrosis or infection: Secondary | ICD-10-CM | POA: Diagnosis not present

## 2020-06-05 DIAGNOSIS — F1290 Cannabis use, unspecified, uncomplicated: Secondary | ICD-10-CM | POA: Diagnosis not present

## 2020-06-05 LAB — LIPASE, BLOOD: Lipase: 91 U/L — ABNORMAL HIGH (ref 11–51)

## 2020-06-05 MED ORDER — AMLODIPINE BESYLATE 10 MG PO TABS: 5.0000 mg | ORAL_TABLET | Freq: Every day | ORAL | 0 refills | Status: DC

## 2020-06-05 MED ORDER — OXYCODONE-ACETAMINOPHEN 5-325 MG PO TABS
1.0000 | ORAL_TABLET | Freq: Three times a day (TID) | ORAL | 0 refills | Status: DC | PRN
Start: 2020-06-05 — End: 2020-06-08

## 2020-06-05 NOTE — Discharge Instructions (Signed)
Abstain from drinking alcohol °

## 2020-06-05 NOTE — Discharge Summary (Signed)
[facility] NAME: Sarah Sosa    MR#:  [id number]  DATE OF BIRTH:  20-Jan-1981  DATE OF ADMISSION:  06/03/2020 ADMITTING PHYSICIAN: Christel Mormon, MD  DATE OF DISCHARGE: 05/2020 PRIMARY CARE PHYSICIAN: [name], MD    ADMISSION DIAGNOSIS:  Recurrent pancreatitis [K85.90] Intractable nausea and vomiting [R11.2] Marijuana use [F12.90] Alcohol-induced acute pancreatitis, unspecified complication status [W65.68]  DISCHARGE DIAGNOSIS:  recurrent pancreatitis, alcohol induced polysubstance abuse with marijuana and smoking  SECONDARY DIAGNOSIS:   Past Medical History:  Diagnosis Date  . Arthritis   . Asthma   . Chronic pain disorder 03/19/2018  . GERD (gastroesophageal reflux disease)   . Headache   . Heart murmur   . History of trichomoniasis 03/19/2018  . Hypertension   . Pancreatitis   . PID (pelvic inflammatory disease) 02/14/2018  . Uterine leiomyoma 03/19/2018    HOSPITAL COURSE:   46 a39 y.o.Caucasian femalewith a known history of asthma, hypertension, chronic pain syndrome, pancreatitis and GERD, who presented to the emergency room with acute onset ofepigastric and right upper quadrant abdominal pain as well as nausea and vomiting which have been recurrent over the last week. Abdominal and pelvic CT scan 2 days ago revealed acute pancreatitis with mild diffuse fatty infiltration of the liver and 5 cm left ovarian cyst.  1. Recurrent acute pancreatitis likely alcoholicwith intractable nausea and vomiting with abdominal pain. -Received IV fluids --  IV PRN Dilaudid NPO patient has history of substance abuse and possible opioid overdose according to your records. Will continue to monitor closely. -- Tolerating clear liquid diet. Patient agreeable to it. Lipase down to 132-- 91. -- Patient advised on alcohol abstinence.  -- Advanced to regular diet for patient request and she wants to eat  salad  2. Hypokalemia and hypomagnesemia. -Received IV potassium and mag  3. Polysubstance abuse including alcohol abuse, tobacco abuse and marijuana abuse.Marijuana abuse could be early contributing to her hyperemesis. -she was counseled for cessation and will be monitored for alcohol withdrawal. -- Currently does not seem to be exhibiting signs in terms of withdrawal.  4. Hypertension. -- continue lisinopril and amlodipine.  5. Asthma. -continue Singulair and Symbicort as well as as needed albuterol..  6. DVT prophylaxis. Subcutaneous Lovenox  overall improving. Relatively stable. Still has some abdominal pain. I have prescribed 15 pills of Percocet for her. Patient advised to abstain from drinking alcohol. She will discharge to home follow-up with PCP as outpatient Procedures: Family communication : none Consults : Education officer, museum CODE STATUS: full DVT Prophylaxis : Lovenox  Status is: Inpatient   Dispo: The patient is from: Home  Anticipated d/c is to: Home  Anticipated d/c date is: today  Patient currently is medically best at baseline and stable to d/c.  CONSULTS OBTAINED:    DRUG ALLERGIES:   Allergies  Allergen Reactions  . Latex Anaphylaxis, Hives and Itching    Other reaction(s): Other (See Comments) SKIN BURNING & PEELING   . Nsaids Other (See Comments)    Serve ulcers; stomach aches, GERD.   . Tomato Anaphylaxis, Hives and Itching  . Tramadol Itching    DISCHARGE MEDICATIONS:   Allergies as of 06/05/2020      Reactions   Latex Anaphylaxis, Hives, Itching   Other reaction(s): Other (See Comments) SKIN BURNING & PEELING   Nsaids Other (See Comments)   Serve ulcers; stomach aches, GERD.    Tomato Anaphylaxis, Hives, Itching   Tramadol Itching  Medication List    STOP taking these medications   Oxycodone HCl 10 MG Tabs     TAKE these medications   amLODipine 10 MG tablet Commonly  known as: NORVASC Take 0.5 tablets (5 mg total) by mouth daily.   cyclobenzaprine 10 MG tablet Commonly known as: FLEXERIL Take 10 mg by mouth 3 (three) times daily.   lisinopril 20 MG tablet Commonly known as: ZESTRIL Take 1 tablet (20 mg total) by mouth 2 (two) times daily at 10 AM and 5 PM. What changed: when to take this   montelukast 10 MG tablet Commonly known as: SINGULAIR Take 10 mg by mouth daily.   ondansetron 4 MG tablet Commonly known as: Zofran Take 1 tablet (4 mg total) by mouth every 8 (eight) hours as needed for up to 10 doses for nausea or vomiting.   oxyCODONE-acetaminophen 5-325 MG tablet Commonly known as: Percocet Take 1 tablet by mouth every 8 (eight) hours as needed for up to 5 days for severe pain.   ProAir HFA 108 (90 Base) MCG/ACT inhaler Generic drug: albuterol Inhale 2 puffs into the lungs every 4 (four) hours as needed.   sucralfate 1 g tablet Commonly known as: Carafate Take 1 tablet (1 g total) by mouth 4 (four) times daily for 15 days.   Symbicort 160-4.5 MCG/ACT inhaler Generic drug: budesonide-formoterol Inhale 2 puffs into the lungs 2 (two) times daily.   Xtampza ER 13.5 MG C12a Generic drug: oxyCODONE ER Take 1 capsule by mouth every 8 (eight) hours.       If you experience worsening of your admission symptoms, develop shortness of breath, life threatening emergency, suicidal or homicidal thoughts you must seek medical attention immediately by calling 911 or calling your MD immediately  if symptoms less severe.  You Must read complete instructions/literature along with all the possible adverse reactions/side effects for all the Medicines you take and that have been prescribed to you. Take any new Medicines after you have completely understood and accept all the possible adverse reactions/side effects.   Please note  You were cared for by a hospitalist during your hospital stay. If you have any questions about your discharge  medications or the care you received while you were in the hospital after you are discharged, you can call the unit and asked to speak with the hospitalist on call if the hospitalist that took care of you is not available. Once you are discharged, your primary care physician will handle any further medical issues. Please note that NO REFILLS for any discharge medications will be authorized once you are discharged, as it is imperative that you return to your primary care physician (or establish a relationship with a primary care physician if you do not have one) for your aftercare needs so that they can reassess your need for medications and monitor your lab values. Today   SUBJECTIVE  my stomach is cramping  overall had unstable day yesterday. Has been asking and receiving IV and oral pain meds. Tolerated clear liquid. Does not like the choices on full liquid wants to eat salad.   VITAL SIGNS:  Blood pressure (!) 156/84, pulse 70, temperature 98.4 F (36.9 C), resp. rate 19, height 5\' 6"  (1.676 m), weight 91.2 kg, last menstrual period 03/25/2018, SpO2 95 %.  I/O:    Intake/Output Summary (Last 24 hours) at 06/05/2020 1226 Last data filed at 06/05/2020 0956 Gross per 24 hour  Intake 1713.46 ml  Output --  Net 1713.46 ml  PHYSICAL EXAMINATION:  GENERAL:  39 y.o.-year-old patient lying in the bed with no acute distress. Obese EYES: Pupils equal, round, reactive to light and accommodation. No scleral icterus.  HEENT: Head atraumatic, normocephalic. Oropharynx and nasopharynx clear.  NECK:  Supple, no jugular venous distention. No thyroid enlargement, no tenderness.  LUNGS: Normal breath sounds bilaterally, no wheezing, rales,rhonchi or crepitation. No use of accessory muscles of respiration.  CARDIOVASCULAR: S1, S2 normal. No murmurs, rubs, or gallops.  ABDOMEN: Soft, diffuse tenderness, non-distended. Bowel sounds present. No organomegaly or mass. No Guarding or rigidity EXTREMITIES:  No pedal edema, cyanosis, or clubbing.  NEUROLOGIC: Cranial nerves II through XII are intact. Muscle strength 5/5 in all extremities. Sensation intact. Gait not checked.  PSYCHIATRIC: The patient is alert and oriented x 3.  SKIN: No obvious rash, lesion, or ulcer.   DATA REVIEW:   CBC  Recent Labs  Lab 06/04/20 0432  WBC 8.3  HGB 13.9  HCT 41.3  PLT 215    Chemistries  Recent Labs  Lab 06/03/20 2157 06/03/20 2157 06/04/20 0432  NA 139   < > 139  K 3.0*   < > 3.2*  CL 99   < > 99  CO2 27   < > 27  GLUCOSE 99   < > 87  BUN 8   < > 7  CREATININE 0.94   < > 0.82  CALCIUM 9.4   < > 9.0  MG 1.4*  --   --   AST 23   < > 19  ALT 8   < > 8  ALKPHOS 68   < > 60  BILITOT 0.7   < > 0.5   < > = values in this interval not displayed.    Microbiology Results   Recent Results (from the past 240 hour(s))  Respiratory Panel by RT PCR (Flu A&B, Covid) - Nasopharyngeal Swab     Status: None   Collection Time: 06/01/20 12:55 PM   Specimen: Nasopharyngeal Swab  Result Value Ref Range Status   SARS Coronavirus 2 by RT PCR NEGATIVE NEGATIVE Final    Comment: (NOTE) SARS-CoV-2 target nucleic acids are NOT DETECTED.  The SARS-CoV-2 RNA is generally detectable in upper respiratoy specimens during the acute phase of infection. The lowest concentration of SARS-CoV-2 viral copies this assay can detect is 131 copies/mL. A negative result does not preclude SARS-Cov-2 infection and should not be used as the sole basis for treatment or other patient management decisions. A negative result may occur with  improper specimen collection/handling, submission of specimen other than nasopharyngeal swab, presence of viral mutation(s) within the areas targeted by this assay, and inadequate number of viral copies (<131 copies/mL). A negative result must be combined with clinical observations, patient history, and epidemiological information. The expected result is Negative.  Fact Sheet for  Patients:  PinkCheek.be  Fact Sheet for Healthcare Providers:  GravelBags.it  This test is no t yet approved or cleared by the [place] FDA and  has been authorized for detection and/or diagnosis of SARS-CoV-2 by FDA under an Emergency Use Authorization (EUA). This EUA will remain  in effect (meaning this test can be used) for the duration of the COVID-19 declaration under Section 564(b)(1) of the Act, 21 U.S.C. section 360bbb-3(b)(1), unless the authorization is terminated or revoked sooner.     Influenza A by PCR NEGATIVE NEGATIVE Final   Influenza B by PCR NEGATIVE NEGATIVE Final    Comment: (NOTE) The Xpert Xpress SARS-CoV-2/FLU/RSV assay  is intended as an aid in  the diagnosis of influenza from Nasopharyngeal swab specimens and  should not be used as a sole basis for treatment. Nasal washings and  aspirates are unacceptable for Xpert Xpress SARS-CoV-2/FLU/RSV  testing.  Fact Sheet for Patients: PinkCheek.be  Fact Sheet for Healthcare Providers: GravelBags.it  This test is not yet approved or cleared by the [place] FDA and  has been authorized for detection and/or diagnosis of SARS-CoV-2 by  FDA under an Emergency Use Authorization (EUA). This EUA will remain  in effect (meaning this test can be used) for the duration of the  Covid-19 declaration under Section 564(b)(1) of the Act, 21  U.S.C. section 360bbb-3(b)(1), unless the authorization is  terminated or revoked. Performed at [facility], [address] , [place] [address]   Resp Panel by RT-PCR (Flu A&B, Covid) Nasopharyngeal Swab     Status: None   Collection Time: 06/03/20 11:46 PM   Specimen: Nasopharyngeal Swab; Nasopharyngeal(NP) swabs in vial transport medium  Result Value Ref Range Status   SARS Coronavirus 2 by RT PCR NEGATIVE NEGATIVE Final    Comment:  (NOTE) SARS-CoV-2 target nucleic acids are NOT DETECTED.  The SARS-CoV-2 RNA is generally detectable in upper respiratory specimens during the acute phase of infection. The lowest concentration of SARS-CoV-2 viral copies this assay can detect is 138 copies/mL. A negative result does not preclude SARS-Cov-2 infection and should not be used as the sole basis for treatment or other patient management decisions. A negative result may occur with  improper specimen collection/handling, submission of specimen other than nasopharyngeal swab, presence of viral mutation(s) within the areas targeted by this assay, and inadequate number of viral copies(<138 copies/mL). A negative result must be combined with clinical observations, patient history, and epidemiological information. The expected result is Negative.  Fact Sheet for Patients:  EntrepreneurPulse.com.au  Fact Sheet for Healthcare Providers:  IncredibleEmployment.be  This test is no t yet approved or cleared by the [place] FDA and  has been authorized for detection and/or diagnosis of SARS-CoV-2 by FDA under an Emergency Use Authorization (EUA). This EUA will remain  in effect (meaning this test can be used) for the duration of the COVID-19 declaration under Section 564(b)(1) of the Act, 21 U.S.C.section 360bbb-3(b)(1), unless the authorization is terminated  or revoked sooner.       Influenza A by PCR NEGATIVE NEGATIVE Final   Influenza B by PCR NEGATIVE NEGATIVE Final    Comment: (NOTE) The Xpert Xpress SARS-CoV-2/FLU/RSV plus assay is intended as an aid in the diagnosis of influenza from Nasopharyngeal swab specimens and should not be used as a sole basis for treatment. Nasal washings and aspirates are unacceptable for Xpert Xpress SARS-CoV-2/FLU/RSV testing.  Fact Sheet for Patients: EntrepreneurPulse.com.au  Fact Sheet for Healthcare  Providers: IncredibleEmployment.be  This test is not yet approved or cleared by the [place] FDA and has been authorized for detection and/or diagnosis of SARS-CoV-2 by FDA under an Emergency Use Authorization (EUA). This EUA will remain in effect (meaning this test can be used) for the duration of the COVID-19 declaration under Section 564(b)(1) of the Act, 21 U.S.C. section 360bbb-3(b)(1), unless the authorization is terminated or revoked.  Performed at [facility], [address] [place], [place] [address]     RADIOLOGY:  No results found.   CODE STATUS:     Code Status Orders  (From admission, onward)         Start  Ordered   06/04/20 0144  Full code  Continuous        06/04/20 0147        Code Status History    Date Active Date Inactive Code Status Order ID Comments User Context   04/03/2020 1424 04/05/2020 2002 Full Code [id number]  Nolberto Hanlon, MD ED   12/09/2019 2226 12/11/2019 2217 Full Code [id number]  Mansy, Arvella Merles, MD ED   04/20/2018 2235 04/23/2018 1636 Full Code [id number]  Harlin Heys, MD ED   04/15/2018 1308 04/19/2018 0034 Full Code [id number]  Rubie Maid, MD Inpatient   02/14/2018 1448 02/16/2018 1825 Full Code [id number]  Harlin Heys, MD ED   Advance Care Planning Activity       TOTAL TIME TAKING CARE OF THIS PATIENT: *35* minutes.    [name]  [facility]    CC: Primary care physician; [name], MD

## 2020-06-05 NOTE — Progress Notes (Signed)
Patient ID: Sarah Sosa, female   DOB: [birth year], 39 y.o.   MRN: [id number]  Patient age 30% of regular meal and then started complaining of abdominal pain. She is wanting more IV Dilaudid. I'm worried about over using pain meds. Her lipase is 91.  She refuses to go home today. I'll cancel discharge. Will switch to clear liquid diet.

## 2020-06-06 DIAGNOSIS — K859 Acute pancreatitis without necrosis or infection, unspecified: Secondary | ICD-10-CM | POA: Diagnosis not present

## 2020-06-06 LAB — LIPASE, BLOOD: Lipase: 113 U/L — ABNORMAL HIGH (ref 11–51)

## 2020-06-06 MED ORDER — OXYCODONE-ACETAMINOPHEN 5-325 MG PO TABS
3.0000 | ORAL_TABLET | Freq: Four times a day (QID) | ORAL | Status: DC | PRN
  Administered 2020-06-06 – 2020-06-08 (×7): 3 via ORAL
  Filled 2020-06-06 (×7): qty 3

## 2020-06-06 MED ORDER — OXYCODONE HCL 5 MG PO TABS
5.0000 mg | ORAL_TABLET | Freq: Once | ORAL | Status: AC
  Administered 2020-06-06: 5 mg via ORAL
  Filled 2020-06-06: qty 1

## 2020-06-06 MED ORDER — SODIUM CHLORIDE 0.9 % IV SOLN: INTRAVENOUS | Status: DC

## 2020-06-06 MED ORDER — OXYCODONE-ACETAMINOPHEN 5-325 MG PO TABS: 2.0000 | ORAL_TABLET | Freq: Four times a day (QID) | ORAL | Status: DC | PRN

## 2020-06-06 NOTE — Progress Notes (Signed)
Patient c/o abdominal pain and requested pain medication.  Oxycodone is not due yet.  Dr. Billie Ruddy made aware and will place an order.

## 2020-06-06 NOTE — Progress Notes (Signed)
PROGRESS NOTE    Sarah Sosa  JYN:[id number] DOB: 03-13-1981 DOA: 06/03/2020 PCP: [name], MD    Assessment & Plan:   Active Problems:   Primary hypertension   Recurrent pancreatitis   Intractable nausea and vomiting   Marijuana use   [age] y.o.Caucasian femalewith a known history of asthma, hypertension, chronic pain syndrome, pancreatitis and GERD, who presented to the emergency room with acute onset ofepigastric and right upper quadrant abdominal pain as well as nausea and vomiting which have been recurrent over the last week. Abdominal and pelvic CT scan 2 days ago revealed acute pancreatitis with mild diffuse fatty infiltration of the liver and 5 cm left ovarian cyst.  1. Recurrent acute pancreatitis likely alcoholicwith intractable nausea and vomiting with abdominal pain. -Received IV fluids --was ordered IV PRN Dilaudid; patient has history of substance abuse and possible opioid overdose according to records.  PLAN: --advance to full liquid today --d/c IV dilaudid --Percocet 3 tablets q6h PRN --NS@100  ml/hr --Patient advised on alcohol abstinence.   Chronic pain on chronic opioids --according to PDMP, pt was prescribed XTAMPZA ER 13.5 MG TID PRN most recently. --Percocet 3 tablets q6h PRN due to increased pain from pancreatitis  2. Hypokalemia and hypomagnesemia. -replete with oral potassium and IV mag  3. Polysubstance abuse including alcohol abuse, tobacco abuse and marijuana abuse. --Marijuana abuse could be early contributing to her hyperemesis. -she was counseled for cessation and will be monitored for alcohol withdrawal. --Currently does not seem to be exhibiting signs in terms of withdrawal.  4. Hypertension. --cont Lisinopril and amlodipine  5. Asthma. -continue Singulair and Symbicort as well as as needed albuterol..   DVT prophylaxis: Lovenox SQ Code Status: Full code  Family Communication:  Status is:  inpatient Dispo:   The patient is from: home Anticipated d/c is to: home Anticipated d/c date is: 1-2 days Patient currently is not medically stable to d/c due to: not on full diet due to abdominal pain from pancreatitis   Subjective and Interval History:  Per pt, abdominal pain worsened after she had a salad yesterday.  Continued to complain of abdominal pain and asking for more opioids pain meds than what she normally takes at home.  Tolerating clear liquid diet.    Objective: Vitals:   06/06/20 0409 06/06/20 0733 06/06/20 1124 06/06/20 1516  BP: (!) 143/72 137/69 137/75 (!) 153/87  Pulse: 69 65 67 66  Resp: 18 17 19 18   Temp: 98.6 F (37 C) 98.5 F (36.9 C) 98.4 F (36.9 C) 98.4 F (36.9 C)  TempSrc: Oral     SpO2: 100% 98% 98% 97%  Weight:      Height:        Intake/Output Summary (Last 24 hours) at 06/06/2020 1913 Last data filed at 06/06/2020 1619 Gross per 24 hour  Intake 764.38 ml  Output --  Net 764.38 ml   Filed Weights   06/03/20 2041 06/04/20 0225  Weight: 96.2 kg 91.2 kg    Examination:   Constitutional: NAD, AAOx3 HEENT: conjunctivae and lids normal, EOMI CV: No cyanosis.   RESP: normal respiratory effort, on RA Extremities: No effusions, edema in BLE SKIN: warm, dry and intact Neuro: II - XII grossly intact.      Data Reviewed: I have personally reviewed following labs and imaging studies  CBC: Recent Labs  Lab 06/01/20 1255 06/03/20 2157 06/04/20 0432  WBC 6.3 10.5 8.3  HGB 14.4 15.8* 13.9  HCT 41.7 46.9* 41.3  MCV 96.8 99.8  99.0  PLT 253 237 161   Basic Metabolic Panel: Recent Labs  Lab 06/01/20 1255 06/01/20 1258 06/03/20 2157 06/04/20 0432  NA 138  --  139 139  K 2.8*  --  3.0* 3.2*  CL 99  --  99 99  CO2 27  --  27 27  GLUCOSE 120*  --  99 87  BUN 8  --  8 7  CREATININE 0.81  --  0.94 0.82  CALCIUM 8.7*  --  9.4 9.0  MG  --  1.1* 1.4*  --    GFR: Estimated Creatinine Clearance: 105.9 mL/min (by C-G formula based  on SCr of 0.82 mg/dL). Liver Function Tests: Recent Labs  Lab 06/01/20 1255 06/03/20 2157 06/04/20 0432  AST 22 23 19   ALT 8 8 8   ALKPHOS 62 68 60  BILITOT 1.1 0.7 0.5  PROT 7.4 8.1 7.0  ALBUMIN 3.9 4.1 3.5   Recent Labs  Lab 06/01/20 1255 06/03/20 2157 06/04/20 0432 06/05/20 0443 06/06/20 0431  LIPASE 587* 197* 132* 91* 113*   No results for input(s): AMMONIA in the last 168 hours. Coagulation Profile: No results for input(s): INR, PROTIME in the last 168 hours. Cardiac Enzymes: No results for input(s): CKTOTAL, CKMB, CKMBINDEX, TROPONINI in the last 168 hours. BNP (last 3 results) No results for input(s): PROBNP in the last 8760 hours. HbA1C: No results for input(s): HGBA1C in the last 72 hours. CBG: Recent Labs  Lab 06/04/20 0812 06/04/20 1224 06/04/20 1601  GLUCAP 94 86 94   Lipid Profile: No results for input(s): CHOL, HDL, LDLCALC, TRIG, CHOLHDL, LDLDIRECT in the last 72 hours. Thyroid Function Tests: No results for input(s): TSH, T4TOTAL, FREET4, T3FREE, THYROIDAB in the last 72 hours. Anemia Panel: No results for input(s): VITAMINB12, FOLATE, FERRITIN, TIBC, IRON, RETICCTPCT in the last 72 hours. Sepsis Labs: No results for input(s): PROCALCITON, LATICACIDVEN in the last 168 hours.  Recent Results (from the past 240 hour(s))  Respiratory Panel by RT PCR (Flu A&B, Covid) - Nasopharyngeal Swab     Status: None   Collection Time: 06/01/20 12:55 PM   Specimen: Nasopharyngeal Swab  Result Value Ref Range Status   SARS Coronavirus 2 by RT PCR NEGATIVE NEGATIVE Final    Comment: (NOTE) SARS-CoV-2 target nucleic acids are NOT DETECTED.  The SARS-CoV-2 RNA is generally detectable in upper respiratoy specimens during the acute phase of infection. The lowest concentration of SARS-CoV-2 viral copies this assay can detect is 131 copies/mL. A negative result does not preclude SARS-Cov-2 infection and should not be used as the sole basis for treatment or other  patient management decisions. A negative result may occur with  improper specimen collection/handling, submission of specimen other than nasopharyngeal swab, presence of viral mutation(s) within the areas targeted by this assay, and inadequate number of viral copies (<131 copies/mL). A negative result must be combined with clinical observations, patient history, and epidemiological information. The expected result is Negative.  Fact Sheet for Patients:  PinkCheek.be  Fact Sheet for Healthcare Providers:  GravelBags.it  This test is no t yet approved or cleared by the [place] FDA and  has been authorized for detection and/or diagnosis of SARS-CoV-2 by FDA under an Emergency Use Authorization (EUA). This EUA will remain  in effect (meaning this test can be used) for the duration of the COVID-19 declaration under Section 564(b)(1) of the Act, 21 U.S.C. section 360bbb-3(b)(1), unless the authorization is terminated or revoked sooner.     Influenza A by  PCR NEGATIVE NEGATIVE Final   Influenza B by PCR NEGATIVE NEGATIVE Final    Comment: (NOTE) The Xpert Xpress SARS-CoV-2/FLU/RSV assay is intended as an aid in  the diagnosis of influenza from Nasopharyngeal swab specimens and  should not be used as a sole basis for treatment. Nasal washings and  aspirates are unacceptable for Xpert Xpress SARS-CoV-2/FLU/RSV  testing.  Fact Sheet for Patients: PinkCheek.be  Fact Sheet for Healthcare Providers: GravelBags.it  This test is not yet approved or cleared by the [place] FDA and  has been authorized for detection and/or diagnosis of SARS-CoV-2 by  FDA under an Emergency Use Authorization (EUA). This EUA will remain  in effect (meaning this test can be used) for the duration of the  Covid-19 declaration under Section 564(b)(1) of the Act, 21  U.S.C. section  360bbb-3(b)(1), unless the authorization is  terminated or revoked. Performed at [facility], [address] [place], [place] [address]   Resp Panel by RT-PCR (Flu A&B, Covid) Nasopharyngeal Swab     Status: None   Collection Time: 06/03/20 11:46 PM   Specimen: Nasopharyngeal Swab; Nasopharyngeal(NP) swabs in vial transport medium  Result Value Ref Range Status   SARS Coronavirus 2 by RT PCR NEGATIVE NEGATIVE Final    Comment: (NOTE) SARS-CoV-2 target nucleic acids are NOT DETECTED.  The SARS-CoV-2 RNA is generally detectable in upper respiratory specimens during the acute phase of infection. The lowest concentration of SARS-CoV-2 viral copies this assay can detect is 138 copies/mL. A negative result does not preclude SARS-Cov-2 infection and should not be used as the sole basis for treatment or other patient management decisions. A negative result may occur with  improper specimen collection/handling, submission of specimen other than nasopharyngeal swab, presence of viral mutation(s) within the areas targeted by this assay, and inadequate number of viral copies(<138 copies/mL). A negative result must be combined with clinical observations, patient history, and epidemiological information. The expected result is Negative.  Fact Sheet for Patients:  EntrepreneurPulse.com.au  Fact Sheet for Healthcare Providers:  IncredibleEmployment.be  This test is no t yet approved or cleared by the [place] FDA and  has been authorized for detection and/or diagnosis of SARS-CoV-2 by FDA under an Emergency Use Authorization (EUA). This EUA will remain  in effect (meaning this test can be used) for the duration of the COVID-19 declaration under Section 564(b)(1) of the Act, 21 U.S.C.section 360bbb-3(b)(1), unless the authorization is terminated  or revoked sooner.       Influenza A by PCR NEGATIVE NEGATIVE Final   Influenza B by PCR  NEGATIVE NEGATIVE Final    Comment: (NOTE) The Xpert Xpress SARS-CoV-2/FLU/RSV plus assay is intended as an aid in the diagnosis of influenza from Nasopharyngeal swab specimens and should not be used as a sole basis for treatment. Nasal washings and aspirates are unacceptable for Xpert Xpress SARS-CoV-2/FLU/RSV testing.  Fact Sheet for Patients: EntrepreneurPulse.com.au  Fact Sheet for Healthcare Providers: IncredibleEmployment.be  This test is not yet approved or cleared by the [place] FDA and has been authorized for detection and/or diagnosis of SARS-CoV-2 by FDA under an Emergency Use Authorization (EUA). This EUA will remain in effect (meaning this test can be used) for the duration of the COVID-19 declaration under Section 564(b)(1) of the Act, 21 U.S.C. section 360bbb-3(b)(1), unless the authorization is terminated or revoked.  Performed at [facility], [address] [place], [place] [address]       Radiology Studies: No results found.  Scheduled Meds: . amLODipine  5 mg Oral Daily  . cyclobenzaprine  10 mg Oral TID  . enoxaparin (LOVENOX) injection  50 mg Subcutaneous Q24H  . lisinopril  20 mg Oral QHS  . mometasone-formoterol  2 puff Inhalation BID  . montelukast  10 mg Oral Daily  . sucralfate  1 g Oral QID   Continuous Infusions: . sodium chloride 100 mL/hr at 06/06/20 1619     LOS: 2 days     Enzo Bi, MD [facility] If 7PM-7AM, please contact night-coverage 06/06/2020, 7:13 PM

## 2020-06-06 NOTE — Progress Notes (Signed)
Patient requested to increase dose of oxycodone.  Patient reports that she takes 10mg  oxycodone four times a day and that current dose is not effective.  Dr. Billie Ruddy made aware and will place an order.

## 2020-06-07 DIAGNOSIS — K859 Acute pancreatitis without necrosis or infection, unspecified: Secondary | ICD-10-CM | POA: Diagnosis not present

## 2020-06-07 LAB — BASIC METABOLIC PANEL
Anion gap: 10 (ref 5–15)
BUN: <5 mg/dL — ABNORMAL LOW (ref 6–20)
CO2: 25 mmol/L (ref 22–32)
Calcium: 8.8 mg/dL — ABNORMAL LOW (ref 8.9–10.3)
Chloride: 104 mmol/L (ref 98–111)
Creatinine, Ser: 0.81 mg/dL (ref 0.44–1.00)
GFR, Estimated: >60 mL/min (ref >60)
Glucose, Bld: 92 mg/dL (ref 70–99)
Potassium: 3.5 mmol/L (ref 3.5–5.1)
Sodium: 139 mmol/L (ref 135–145)

## 2020-06-07 LAB — CBC
HCT: 40.9 % (ref 36.0–46.0)
Hemoglobin: 13.6 g/dL (ref 12.0–15.0)
MCH: 33.3 pg (ref 26.0–34.0)
MCHC: 33.3 g/dL (ref 30.0–36.0)
MCV: 100.2 fL — ABNORMAL HIGH (ref 80.0–100.0)
Platelets: 223 10*3/uL (ref 150–400)
RBC: 4.08 MIL/uL (ref 3.87–5.11)
RDW: 13.3 % (ref 11.5–15.5)
WBC: 4.5 10*3/uL (ref 4.0–10.5)
nRBC: 0 % (ref 0.0–0.2)

## 2020-06-07 LAB — MAGNESIUM: Magnesium: 1.6 mg/dL — ABNORMAL LOW (ref 1.7–2.4)

## 2020-06-07 MED ORDER — MAGNESIUM SULFATE 2 GM/50ML IV SOLN
2.0000 g | Freq: Once | INTRAVENOUS | Status: AC
  Administered 2020-06-07: 13:00:00 2 g via INTRAVENOUS
  Filled 2020-06-07 (×2): qty 50

## 2020-06-07 NOTE — Progress Notes (Signed)
PROGRESS NOTE    Sarah Sosa  KNL:[id number] DOB: 06-18-1981 DOA: 06/03/2020 PCP: [name], MD    Assessment & Plan:   Active Problems:   Primary hypertension   Recurrent pancreatitis   Intractable nausea and vomiting   Marijuana use   [age] y.o.Caucasian femalewith a known history of asthma, hypertension, chronic pain syndrome, pancreatitis and GERD, who presented to the emergency room with acute onset ofepigastric and right upper quadrant abdominal pain as well as nausea and vomiting which have been recurrent over the last week. Abdominal and pelvic CT scan 2 days ago revealed acute pancreatitis with mild diffuse fatty infiltration of the liver and 5 cm left ovarian cyst.  1. Recurrent acute pancreatitis likely alcoholicwith intractable nausea and vomiting with abdominal pain. -Received IV fluids --was ordered IV PRN Dilaudid; patient has history of substance abuse and possible opioid overdose according to records.  IV dilaudid d/c'ed. PLAN: --advance to low fat today --Percocet 3 tablets q6h PRN --NS@100  ml/hr --Patient advised on alcohol abstinence.   Chronic pain on chronic opioids --according to PDMP, pt was prescribed XTAMPZA ER 13.5 MG TID PRN most recently. --cont Percocet 3 tablets q6h PRN  2. Hypokalemia and hypomagnesemia. -Replete PRN  3. Polysubstance abuse including alcohol abuse, tobacco abuse and marijuana abuse. --Marijuana abuse could be early contributing to her hyperemesis. -she was counseled for cessation and will be monitored for alcohol withdrawal. --Currently does not seem to be exhibiting signs in terms of withdrawal.  4. Hypertension. --BP varied widely. --cont Lisinopril and amlodipine  5. Asthma. -continue Singulair and Symbicort as well as as needed albuterol..   DVT prophylaxis: Lovenox SQ Code Status: Full code  Family Communication:  Status is: inpatient Dispo:   The patient is from:  home Anticipated d/c is to: home Anticipated d/c date is: 1-2 days Patient currently is not medically stable to d/c due to: not on full diet due to abdominal pain from pancreatitis, on MIVF   Subjective and Interval History:  Pt reported abdominal pain improved today, and wanted to advance her diet.  Diet advanced to low fat.   Objective: Vitals:   06/06/20 2002 06/07/20 0918 06/07/20 1104 06/07/20 1228  BP: (!) 150/83 (!) 175/89 (!) 160/80 (!) 153/78  Pulse: 74 66 65 65  Resp: 20 18  17   Temp: 98.7 F (37.1 C) 97.9 F (36.6 C)  98.1 F (36.7 C)  TempSrc: Oral Oral    SpO2: 99% 100%  98%  Weight:      Height:        Intake/Output Summary (Last 24 hours) at 06/07/2020 1610 Last data filed at 06/07/2020 0600 Gross per 24 hour  Intake 1871.92 ml  Output --  Net 1871.92 ml   Filed Weights   06/03/20 2041 06/04/20 0225  Weight: 96.2 kg 91.2 kg    Examination:   Constitutional: NAD, AAOx3 HEENT: conjunctivae and lids normal, EOMI CV: No cyanosis.   RESP: normal respiratory effort, on RA GI: abdomen soft, non-tender to palpation Extremities: No effusions, edema in BLE SKIN: warm, dry and intact Neuro: II - XII grossly intact.   Psych: Normal mood and affect.     Data Reviewed: I have personally reviewed following labs and imaging studies  CBC: Recent Labs  Lab 06/01/20 1255 06/03/20 2157 06/04/20 0432 06/07/20 0542  WBC 6.3 10.5 8.3 4.5  HGB 14.4 15.8* 13.9 13.6  HCT 41.7 46.9* 41.3 40.9  MCV 96.8 99.8 99.0 100.2*  PLT 253 237 215 223  Basic Metabolic Panel: Recent Labs  Lab 06/01/20 1255 06/01/20 1258 06/03/20 2157 06/04/20 0432 06/07/20 0542  NA 138  --  139 139 139  K 2.8*  --  3.0* 3.2* 3.5  CL 99  --  99 99 104  CO2 27  --  27 27 25   GLUCOSE 120*  --  99 87 92  BUN 8  --  8 7 <5*  CREATININE 0.81  --  0.94 0.82 0.81  CALCIUM 8.7*  --  9.4 9.0 8.8*  MG  --  1.1* 1.4*  --  1.6*   GFR: Estimated Creatinine Clearance: 107.2 mL/min (by  C-G formula based on SCr of 0.81 mg/dL). Liver Function Tests: Recent Labs  Lab 06/01/20 1255 06/03/20 2157 06/04/20 0432  AST 22 23 19   ALT 8 8 8   ALKPHOS 62 68 60  BILITOT 1.1 0.7 0.5  PROT 7.4 8.1 7.0  ALBUMIN 3.9 4.1 3.5   Recent Labs  Lab 06/01/20 1255 06/03/20 2157 06/04/20 0432 06/05/20 0443 06/06/20 0431  LIPASE 587* 197* 132* 91* 113*   No results for input(s): AMMONIA in the last 168 hours. Coagulation Profile: No results for input(s): INR, PROTIME in the last 168 hours. Cardiac Enzymes: No results for input(s): CKTOTAL, CKMB, CKMBINDEX, TROPONINI in the last 168 hours. BNP (last 3 results) No results for input(s): PROBNP in the last 8760 hours. HbA1C: No results for input(s): HGBA1C in the last 72 hours. CBG: Recent Labs  Lab 06/04/20 0812 06/04/20 1224 06/04/20 1601  GLUCAP 94 86 94   Lipid Profile: No results for input(s): CHOL, HDL, LDLCALC, TRIG, CHOLHDL, LDLDIRECT in the last 72 hours. Thyroid Function Tests: No results for input(s): TSH, T4TOTAL, FREET4, T3FREE, THYROIDAB in the last 72 hours. Anemia Panel: No results for input(s): VITAMINB12, FOLATE, FERRITIN, TIBC, IRON, RETICCTPCT in the last 72 hours. Sepsis Labs: No results for input(s): PROCALCITON, LATICACIDVEN in the last 168 hours.  Recent Results (from the past 240 hour(s))  Respiratory Panel by RT PCR (Flu A&B, Covid) - Nasopharyngeal Swab     Status: None   Collection Time: 06/01/20 12:55 PM   Specimen: Nasopharyngeal Swab  Result Value Ref Range Status   SARS Coronavirus 2 by RT PCR NEGATIVE NEGATIVE Final    Comment: (NOTE) SARS-CoV-2 target nucleic acids are NOT DETECTED.  The SARS-CoV-2 RNA is generally detectable in upper respiratoy specimens during the acute phase of infection. The lowest concentration of SARS-CoV-2 viral copies this assay can detect is 131 copies/mL. A negative result does not preclude SARS-Cov-2 infection and should not be used as the sole basis for  treatment or other patient management decisions. A negative result may occur with  improper specimen collection/handling, submission of specimen other than nasopharyngeal swab, presence of viral mutation(s) within the areas targeted by this assay, and inadequate number of viral copies (<131 copies/mL). A negative result must be combined with clinical observations, patient history, and epidemiological information. The expected result is Negative.  Fact Sheet for Patients:  PinkCheek.be  Fact Sheet for Healthcare Providers:  GravelBags.it  This test is no t yet approved or cleared by the [place] FDA and  has been authorized for detection and/or diagnosis of SARS-CoV-2 by FDA under an Emergency Use Authorization (EUA). This EUA will remain  in effect (meaning this test can be used) for the duration of the COVID-19 declaration under Section 564(b)(1) of the Act, 21 U.S.C. section 360bbb-3(b)(1), unless the authorization is terminated or revoked sooner.  Influenza A by PCR NEGATIVE NEGATIVE Final   Influenza B by PCR NEGATIVE NEGATIVE Final    Comment: (NOTE) The Xpert Xpress SARS-CoV-2/FLU/RSV assay is intended as an aid in  the diagnosis of influenza from Nasopharyngeal swab specimens and  should not be used as a sole basis for treatment. Nasal washings and  aspirates are unacceptable for Xpert Xpress SARS-CoV-2/FLU/RSV  testing.  Fact Sheet for Patients: PinkCheek.be  Fact Sheet for Healthcare Providers: GravelBags.it  This test is not yet approved or cleared by the [place] FDA and  has been authorized for detection and/or diagnosis of SARS-CoV-2 by  FDA under an Emergency Use Authorization (EUA). This EUA will remain  in effect (meaning this test can be used) for the duration of the  Covid-19 declaration under Section 564(b)(1) of the Act, 21    U.S.C. section 360bbb-3(b)(1), unless the authorization is  terminated or revoked. Performed at [facility], [address] [place], [place] [address]   Resp Panel by RT-PCR (Flu A&B, Covid) Nasopharyngeal Swab     Status: None   Collection Time: 06/03/20 11:46 PM   Specimen: Nasopharyngeal Swab; Nasopharyngeal(NP) swabs in vial transport medium  Result Value Ref Range Status   SARS Coronavirus 2 by RT PCR NEGATIVE NEGATIVE Final    Comment: (NOTE) SARS-CoV-2 target nucleic acids are NOT DETECTED.  The SARS-CoV-2 RNA is generally detectable in upper respiratory specimens during the acute phase of infection. The lowest concentration of SARS-CoV-2 viral copies this assay can detect is 138 copies/mL. A negative result does not preclude SARS-Cov-2 infection and should not be used as the sole basis for treatment or other patient management decisions. A negative result may occur with  improper specimen collection/handling, submission of specimen other than nasopharyngeal swab, presence of viral mutation(s) within the areas targeted by this assay, and inadequate number of viral copies(<138 copies/mL). A negative result must be combined with clinical observations, patient history, and epidemiological information. The expected result is Negative.  Fact Sheet for Patients:  EntrepreneurPulse.com.au  Fact Sheet for Healthcare Providers:  IncredibleEmployment.be  This test is no t yet approved or cleared by the [place] FDA and  has been authorized for detection and/or diagnosis of SARS-CoV-2 by FDA under an Emergency Use Authorization (EUA). This EUA will remain  in effect (meaning this test can be used) for the duration of the COVID-19 declaration under Section 564(b)(1) of the Act, 21 U.S.C.section 360bbb-3(b)(1), unless the authorization is terminated  or revoked sooner.       Influenza A by PCR NEGATIVE NEGATIVE Final    Influenza B by PCR NEGATIVE NEGATIVE Final    Comment: (NOTE) The Xpert Xpress SARS-CoV-2/FLU/RSV plus assay is intended as an aid in the diagnosis of influenza from Nasopharyngeal swab specimens and should not be used as a sole basis for treatment. Nasal washings and aspirates are unacceptable for Xpert Xpress SARS-CoV-2/FLU/RSV testing.  Fact Sheet for Patients: EntrepreneurPulse.com.au  Fact Sheet for Healthcare Providers: IncredibleEmployment.be  This test is not yet approved or cleared by the [place] FDA and has been authorized for detection and/or diagnosis of SARS-CoV-2 by FDA under an Emergency Use Authorization (EUA). This EUA will remain in effect (meaning this test can be used) for the duration of the COVID-19 declaration under Section 564(b)(1) of the Act, 21 U.S.C. section 360bbb-3(b)(1), unless the authorization is terminated or revoked.  Performed at [facility], [address], [place],  [address]       Radiology Studies: No  results found.   Scheduled Meds:  amLODipine  5 mg Oral Daily   cyclobenzaprine  10 mg Oral TID   enoxaparin (LOVENOX) injection  50 mg Subcutaneous Q24H   lisinopril  20 mg Oral QHS   mometasone-formoterol  2 puff Inhalation BID   montelukast  10 mg Oral Daily   sucralfate  1 g Oral QID   Continuous Infusions:  sodium chloride 100 mL/hr at 06/07/20 0600     LOS: 3 days     Enzo Bi, MD [facility] If 7PM-7AM, please contact night-coverage 06/07/2020, 4:10 PM

## 2020-06-08 LAB — BASIC METABOLIC PANEL
Anion gap: 7 (ref 5–15)
BUN: 7 mg/dL (ref 6–20)
CO2: 25 mmol/L (ref 22–32)
Calcium: 8.6 mg/dL — ABNORMAL LOW (ref 8.9–10.3)
Chloride: 106 mmol/L (ref 98–111)
Creatinine, Ser: 0.87 mg/dL (ref 0.44–1.00)
GFR, Estimated: >60 mL/min (ref >60)
Glucose, Bld: 89 mg/dL (ref 70–99)
Potassium: 3.6 mmol/L (ref 3.5–5.1)
Sodium: 138 mmol/L (ref 135–145)

## 2020-06-08 LAB — CBC
HCT: 36.4 % (ref 36.0–46.0)
Hemoglobin: 12 g/dL (ref 12.0–15.0)
MCH: 33.1 pg (ref 26.0–34.0)
MCHC: 33 g/dL (ref 30.0–36.0)
MCV: 100.6 fL — ABNORMAL HIGH (ref 80.0–100.0)
Platelets: 271 10*3/uL (ref 150–400)
RBC: 3.62 MIL/uL — ABNORMAL LOW (ref 3.87–5.11)
RDW: 13.2 % (ref 11.5–15.5)
WBC: 4 10*3/uL (ref 4.0–10.5)
nRBC: 0 % (ref 0.0–0.2)

## 2020-06-08 LAB — MAGNESIUM: Magnesium: 1.6 mg/dL — ABNORMAL LOW (ref 1.7–2.4)

## 2020-06-08 LAB — LIPASE, BLOOD: Lipase: 90 U/L — ABNORMAL HIGH (ref 11–51)

## 2020-06-08 MED ORDER — OXYCODONE-ACETAMINOPHEN 5-325 MG PO TABS
2.0000 | ORAL_TABLET | Freq: Four times a day (QID) | ORAL | Status: DC | PRN
  Administered 2020-06-08: 11:00:00 2 via ORAL
  Filled 2020-06-08: qty 2

## 2020-06-08 MED ORDER — MAGNESIUM SULFATE 2 GM/50ML IV SOLN
2.0000 g | INTRAVENOUS | Status: AC
  Administered 2020-06-08 (×2): 2 g via INTRAVENOUS
  Filled 2020-06-08 (×2): qty 50

## 2020-06-08 MED ORDER — AMLODIPINE BESYLATE 10 MG PO TABS: 10.0000 mg | ORAL_TABLET | Freq: Every day | ORAL | 2 refills | Status: DC

## 2020-06-08 NOTE — Progress Notes (Signed)
Pt refused wheelchair for discharge

## 2020-06-08 NOTE — Discharge Summary (Signed)
Physician Discharge Summary   Sarah Sosa  female DOB: 07-29-1980  OVZ:[id number]  PCP: [name], MD  Admit date: 06/03/2020 Discharge date: 06/08/2020  Admitted From: home Disposition:  home CODE STATUS: Full code  Discharge Instructions    Diet Heart   Complete by: As directed        Hospital Course:  For full details, please see H&P, progress notes, consult notes and ancillary notes.  Briefly,  [age] y.o.Caucasian femalewith a known history of asthma, hypertension, chronic pain syndrome, pancreatitis and GERD, who presented to the emergency room with acute onset ofepigastric and right upper quadrant abdominal pain as well as nausea and vomiting which have been recurrent over the last week.  # Recurrent acute pancreatitis likely alcoholicwith intractable nausea and vomiting with abdominal pain. Abdominal and pelvic CT scan 2 days ago revealed acute pancreatitis with mild diffuse fatty infiltration of the liver and 5 cm left ovarian cyst.  Pt received IVF.  Pt was ordered IV PRNDilaudid though patient has history of substance abuse and possible opioid overdose according to records.  IV dilaudid was therefore d/c'ed and pt received Percocet PRN instead.  Diet was advanced as tolerated.  Pt was discharged when she could tolerate low-fat diet.    Chronic pain on chronic opioids according to PDMP, pt was prescribed XTAMPZA ER 13.5 MG TID PRN most recently.  Pt received Percocet 3 tablets q6h PRN during hospitalization due to pt reporting extra pancreatitis pain on top of her regular baseline pain.  Pt was discharged back on home regimen.  Hypokalemia and hypomagnesemia. Repleted PRN.  Polysubstance abuse including alcohol abuse, tobacco abuse and marijuana abuse. Marijuana abuse could be contributing to her hyperemesis.  Pt was counseled for cessation.  Pt did not exhibit signs of withdrawal.  Hypertension. BP varied widely.  Continued  Lisinopril and amlodipine  Asthma. continued Singulair and Symbicort as well as as needed albuterol.   Discharge Diagnoses:  Active Problems:   Primary hypertension   Recurrent pancreatitis   Intractable nausea and vomiting   Marijuana use    Discharge Instructions:  Allergies as of 06/08/2020      Reactions   Latex Anaphylaxis, Hives, Itching   Other reaction(s): Other (See Comments) SKIN BURNING & PEELING   Nsaids Other (See Comments)   Serve ulcers; stomach aches, GERD.    Tomato Anaphylaxis, Hives, Itching   Tramadol Itching      Medication List    STOP taking these medications   Oxycodone HCl 10 MG Tabs   oxyCODONE-acetaminophen 5-325 MG tablet Commonly known as: Percocet     TAKE these medications   amLODipine 10 MG tablet Commonly known as: NORVASC Take 1 tablet (10 mg total) by mouth daily. Blood pressure medication. What changed:   how much to take  additional instructions   cyclobenzaprine 10 MG tablet Commonly known as: FLEXERIL Take 10 mg by mouth 3 (three) times daily.   lisinopril 20 MG tablet Commonly known as: ZESTRIL Take 1 tablet (20 mg total) by mouth 2 (two) times daily at 10 AM and 5 PM. What changed: when to take this   montelukast 10 MG tablet Commonly known as: SINGULAIR Take 10 mg by mouth daily.   ondansetron 4 MG tablet Commonly known as: Zofran Take 1 tablet (4 mg total) by mouth every 8 (eight) hours as needed for up to 10 doses for nausea or vomiting.   ProAir HFA 108 (90 Base) MCG/ACT inhaler Generic drug: albuterol Inhale 2 puffs  into the lungs every 4 (four) hours as needed.   sucralfate 1 g tablet Commonly known as: Carafate Take 1 tablet (1 g total) by mouth 4 (four) times daily for 15 days.   Symbicort 160-4.5 MCG/ACT inhaler Generic drug: budesonide-formoterol Inhale 2 puffs into the lungs 2 (two) times daily.   Xtampza ER 13.5 MG C12a Generic drug: oxyCODONE ER Take 1 capsule by mouth every 8 (eight)  hours.        Follow-up Information    [name], MD. Go in 1 week.   Specialty: Family Medicine Why: patient to make own follow up appointment Contact information: [place] [address] [phone]               Allergies  Allergen Reactions  . Latex Anaphylaxis, Hives and Itching    Other reaction(s): Other (See Comments) SKIN BURNING & PEELING   . Nsaids Other (See Comments)    Serve ulcers; stomach aches, GERD.   . Tomato Anaphylaxis, Hives and Itching  . Tramadol Itching     The results of significant diagnostics from this hospitalization (including imaging, microbiology, ancillary and laboratory) are listed below for reference.   Consultations:   Procedures/Studies: CT ABDOMEN PELVIS W CONTRAST  Result Date: 06/01/2020 CLINICAL DATA:  History of pancreatitis.  Abdominal pain. EXAM: CT ABDOMEN AND PELVIS WITH CONTRAST TECHNIQUE: Multidetector CT imaging of the abdomen and pelvis was performed using the standard protocol following bolus administration of intravenous contrast. CONTRAST:  143mL OMNIPAQUE IOHEXOL 300 MG/ML  SOLN COMPARISON:  03/23/2020 FINDINGS: Lower chest: No acute abnormality. Hepatobiliary: Tiny subcentimeter hypodensity in the right hepatic lobe, likely cyst. Mild diffuse fatty infiltration of the liver. More pronounced low-density adjacent to the falciform ligament, likely focal fat. Prior cholecystectomy. Pancreas: Edema noted around the pancreas compatible with acute pancreatitis. No ductal dilatation or focal abnormality. Spleen: No focal abnormality.  Normal size. Adrenals/Urinary Tract: No adrenal abnormality. No focal renal abnormality. No stones or hydronephrosis. Urinary bladder is unremarkable. Stomach/Bowel: Normal appendix. Stomach, large and small bowel grossly unremarkable. Vascular/Lymphatic: No evidence of aneurysm or adenopathy. Scattered aortic calcifications. Reproductive: Prior hysterectomy. 5 cm cyst in the  left adnexa, likely ovarian cyst. Other: No free fluid or free air. Musculoskeletal: No acute bony abnormality. IMPRESSION: Stranding/edema surrounding the pancreas compatible with acute pancreatitis. Mild diffuse fatty infiltration of the liver. 5 cm left ovarian cyst No follow-up imaging recommended. Note: This recommendation does not apply to premenarchal patients and to those with increased risk (genetic, family history, elevated tumor markers or other high-risk factors) of ovarian cancer. Reference: JACR 2020 Feb; 17(2):248-254. Electronically Signed   By: Rolm Baptise M.D.   On: 06/01/2020 15:11      Labs: BNP (last 3 results) No results for input(s): BNP in the last 8760 hours. Basic Metabolic Panel: Recent Labs  Lab 06/03/20 2157 06/04/20 0432 06/07/20 0542 06/08/20 0703  NA 139 139 139 138  K 3.0* 3.2* 3.5 3.6  CL 99 99 104 106  CO2 27 27 25 25   GLUCOSE 99 87 92 89  BUN 8 7 <5* 7  CREATININE 0.94 0.82 0.81 0.87  CALCIUM 9.4 9.0 8.8* 8.6*  MG 1.4*  --  1.6* 1.6*   Liver Function Tests: Recent Labs  Lab 06/03/20 2157 06/04/20 0432  AST 23 19  ALT 8 8  ALKPHOS 68 60  BILITOT 0.7 0.5  PROT 8.1 7.0  ALBUMIN 4.1 3.5   Recent Labs  Lab 06/03/20 2157 06/04/20 0432 06/05/20  5784 06/06/20 0431 06/08/20 0703  LIPASE 197* 132* 91* 113* 90*   No results for input(s): AMMONIA in the last 168 hours. CBC: Recent Labs  Lab 06/03/20 2157 06/04/20 0432 06/07/20 0542 06/08/20 0703  WBC 10.5 8.3 4.5 4.0  HGB 15.8* 13.9 13.6 12.0  HCT 46.9* 41.3 40.9 36.4  MCV 99.8 99.0 100.2* 100.6*  PLT 237 215 223 271   Cardiac Enzymes: No results for input(s): CKTOTAL, CKMB, CKMBINDEX, TROPONINI in the last 168 hours. BNP: Invalid input(s): POCBNP CBG: Recent Labs  Lab 06/04/20 0812 06/04/20 1224 06/04/20 1601  GLUCAP 94 86 94   D-Dimer No results for input(s): DDIMER in the last 72 hours. Hgb A1c No results for input(s): HGBA1C in the last 72 hours. Lipid  Profile No results for input(s): CHOL, HDL, LDLCALC, TRIG, CHOLHDL, LDLDIRECT in the last 72 hours. Thyroid function studies No results for input(s): TSH, T4TOTAL, T3FREE, THYROIDAB in the last 72 hours.  Invalid input(s): FREET3 Anemia work up No results for input(s): VITAMINB12, FOLATE, FERRITIN, TIBC, IRON, RETICCTPCT in the last 72 hours. Urinalysis    Component Value Date/Time   COLORURINE YELLOW (A) 06/03/2020 0841   APPEARANCEUR HAZY (A) 06/03/2020 0841   LABSPEC 1.013 06/03/2020 0841   PHURINE 7.0 06/03/2020 0841   GLUCOSEU NEGATIVE 06/03/2020 0841   HGBUR NEGATIVE 06/03/2020 0841   BILIRUBINUR NEGATIVE 06/03/2020 0841   KETONESUR 20 (A) 06/03/2020 0841   PROTEINUR NEGATIVE 06/03/2020 0841   NITRITE NEGATIVE 06/03/2020 0841   LEUKOCYTESUR NEGATIVE 06/03/2020 0841   Sepsis Labs Invalid input(s): PROCALCITONIN,  WBC,  LACTICIDVEN Microbiology Recent Results (from the past 240 hour(s))  Respiratory Panel by RT PCR (Flu A&B, Covid) - Nasopharyngeal Swab     Status: None   Collection Time: 06/01/20 12:55 PM   Specimen: Nasopharyngeal Swab  Result Value Ref Range Status   SARS Coronavirus 2 by RT PCR NEGATIVE NEGATIVE Final    Comment: (NOTE) SARS-CoV-2 target nucleic acids are NOT DETECTED.  The SARS-CoV-2 RNA is generally detectable in upper respiratoy specimens during the acute phase of infection. The lowest concentration of SARS-CoV-2 viral copies this assay can detect is 131 copies/mL. A negative result does not preclude SARS-Cov-2 infection and should not be used as the sole basis for treatment or other patient management decisions. A negative result may occur with  improper specimen collection/handling, submission of specimen other than nasopharyngeal swab, presence of viral mutation(s) within the areas targeted by this assay, and inadequate number of viral copies (<131 copies/mL). A negative result must be combined with clinical observations, patient history,  and epidemiological information. The expected result is Negative.  Fact Sheet for Patients:  PinkCheek.be  Fact Sheet for Healthcare Providers:  GravelBags.it  This test is no t yet approved or cleared by the [place] FDA and  has been authorized for detection and/or diagnosis of SARS-CoV-2 by FDA under an Emergency Use Authorization (EUA). This EUA will remain  in effect (meaning this test can be used) for the duration of the COVID-19 declaration under Section 564(b)(1) of the Act, 21 U.S.C. section 360bbb-3(b)(1), unless the authorization is terminated or revoked sooner.     Influenza A by PCR NEGATIVE NEGATIVE Final   Influenza B by PCR NEGATIVE NEGATIVE Final    Comment: (NOTE) The Xpert Xpress SARS-CoV-2/FLU/RSV assay is intended as an aid in  the diagnosis of influenza from Nasopharyngeal swab specimens and  should not be used as a sole basis for treatment. Nasal washings and  aspirates are  unacceptable for Xpert Xpress SARS-CoV-2/FLU/RSV  testing.  Fact Sheet for Patients: PinkCheek.be  Fact Sheet for Healthcare Providers: GravelBags.it  This test is not yet approved or cleared by the [place] FDA and  has been authorized for detection and/or diagnosis of SARS-CoV-2 by  FDA under an Emergency Use Authorization (EUA). This EUA will remain  in effect (meaning this test can be used) for the duration of the  Covid-19 declaration under Section 564(b)(1) of the Act, 21  U.S.C. section 360bbb-3(b)(1), unless the authorization is  terminated or revoked. Performed at [facility], [address] [place], [place] [address]   Resp Panel by RT-PCR (Flu A&B, Covid) Nasopharyngeal Swab     Status: None   Collection Time: 06/03/20 11:46 PM   Specimen: Nasopharyngeal Swab; Nasopharyngeal(NP) swabs in vial transport medium  Result Value Ref Range  Status   SARS Coronavirus 2 by RT PCR NEGATIVE NEGATIVE Final    Comment: (NOTE) SARS-CoV-2 target nucleic acids are NOT DETECTED.  The SARS-CoV-2 RNA is generally detectable in upper respiratory specimens during the acute phase of infection. The lowest concentration of SARS-CoV-2 viral copies this assay can detect is 138 copies/mL. A negative result does not preclude SARS-Cov-2 infection and should not be used as the sole basis for treatment or other patient management decisions. A negative result may occur with  improper specimen collection/handling, submission of specimen other than nasopharyngeal swab, presence of viral mutation(s) within the areas targeted by this assay, and inadequate number of viral copies(<138 copies/mL). A negative result must be combined with clinical observations, patient history, and epidemiological information. The expected result is Negative.  Fact Sheet for Patients:  EntrepreneurPulse.com.au  Fact Sheet for Healthcare Providers:  IncredibleEmployment.be  This test is no t yet approved or cleared by the [place] FDA and  has been authorized for detection and/or diagnosis of SARS-CoV-2 by FDA under an Emergency Use Authorization (EUA). This EUA will remain  in effect (meaning this test can be used) for the duration of the COVID-19 declaration under Section 564(b)(1) of the Act, 21 U.S.C.section 360bbb-3(b)(1), unless the authorization is terminated  or revoked sooner.       Influenza A by PCR NEGATIVE NEGATIVE Final   Influenza B by PCR NEGATIVE NEGATIVE Final    Comment: (NOTE) The Xpert Xpress SARS-CoV-2/FLU/RSV plus assay is intended as an aid in the diagnosis of influenza from Nasopharyngeal swab specimens and should not be used as a sole basis for treatment. Nasal washings and aspirates are unacceptable for Xpert Xpress SARS-CoV-2/FLU/RSV testing.  Fact Sheet for  Patients: EntrepreneurPulse.com.au  Fact Sheet for Healthcare Providers: IncredibleEmployment.be  This test is not yet approved or cleared by the [place] FDA and has been authorized for detection and/or diagnosis of SARS-CoV-2 by FDA under an Emergency Use Authorization (EUA). This EUA will remain in effect (meaning this test can be used) for the duration of the COVID-19 declaration under Section 564(b)(1) of the Act, 21 U.S.C. section 360bbb-3(b)(1), unless the authorization is terminated or revoked.  Performed at [facility], [address] [place], [place] [address]      Total time spend on discharging this patient, including the last patient exam, discussing the hospital stay, instructions for ongoing care as it relates to all pertinent caregivers, as well as preparing the medical discharge records, prescriptions, and/or referrals as applicable, is 35 minutes.    Enzo Bi, MD  [facility] 06/08/2020, 1:17 PM

## 2020-06-08 NOTE — Progress Notes (Signed)
Pt verbalized understanding of all d/c instructions including follow up with PCP.

## 2020-07-25 ENCOUNTER — Emergency Department
Admission: EM | Admit: 2020-07-25 | Discharge: 2020-07-25 | Disposition: A | Discharge status: Home / Self Care | Payer: Medicaid Other | Attending: Emergency Medicine | Admitting: Emergency Medicine

## 2020-07-25 ENCOUNTER — Other Ambulatory Visit: Payer: Self-pay

## 2020-07-25 DIAGNOSIS — Z79899 Other long term (current) drug therapy: Secondary | ICD-10-CM | POA: Diagnosis not present

## 2020-07-25 DIAGNOSIS — I10 Essential (primary) hypertension: Secondary | ICD-10-CM | POA: Diagnosis not present

## 2020-07-25 DIAGNOSIS — K859 Acute pancreatitis without necrosis or infection, unspecified: Secondary | ICD-10-CM | POA: Diagnosis not present

## 2020-07-25 DIAGNOSIS — Z7951 Long term (current) use of inhaled steroids: Secondary | ICD-10-CM | POA: Diagnosis not present

## 2020-07-25 DIAGNOSIS — F17210 Nicotine dependence, cigarettes, uncomplicated: Secondary | ICD-10-CM | POA: Insufficient documentation

## 2020-07-25 DIAGNOSIS — J45909 Unspecified asthma, uncomplicated: Secondary | ICD-10-CM | POA: Diagnosis not present

## 2020-07-25 DIAGNOSIS — Z91040 Latex allergy status: Secondary | ICD-10-CM | POA: Diagnosis not present

## 2020-07-25 DIAGNOSIS — R42 Dizziness and giddiness: Secondary | ICD-10-CM | POA: Diagnosis not present

## 2020-07-25 DIAGNOSIS — R030 Elevated blood-pressure reading, without diagnosis of hypertension: Secondary | ICD-10-CM | POA: Diagnosis present

## 2020-07-25 LAB — BASIC METABOLIC PANEL
Anion gap: 13 (ref 5–15)
BUN: 14 mg/dL (ref 6–20)
CO2: 27 mmol/L (ref 22–32)
Calcium: 9.6 mg/dL (ref 8.9–10.3)
Chloride: 102 mmol/L (ref 98–111)
Creatinine, Ser: 0.97 mg/dL (ref 0.44–1.00)
GFR, Estimated: >60 mL/min (ref >60)
Glucose, Bld: 92 mg/dL (ref 70–99)
Potassium: 3.4 mmol/L — ABNORMAL LOW (ref 3.5–5.1)
Sodium: 142 mmol/L (ref 135–145)

## 2020-07-25 LAB — URINALYSIS, COMPLETE (UACMP) WITH MICROSCOPIC
Bacteria, UA: NONE SEEN
Bilirubin Urine: NEGATIVE
Glucose, UA: NEGATIVE mg/dL
Hgb urine dipstick: NEGATIVE
Ketones, ur: NEGATIVE mg/dL
Leukocytes,Ua: NEGATIVE
Nitrite: NEGATIVE
Protein, ur: 30 mg/dL — AB
Specific Gravity, Urine: 1.02 (ref 1.005–1.030)
pH: 8 (ref 5.0–8.0)

## 2020-07-25 LAB — CBC
HCT: 40.7 % (ref 36.0–46.0)
Hemoglobin: 13.6 g/dL (ref 12.0–15.0)
MCH: 32.8 pg (ref 26.0–34.0)
MCHC: 33.4 g/dL (ref 30.0–36.0)
MCV: 98.1 fL (ref 80.0–100.0)
Platelets: 235 10*3/uL (ref 150–400)
RBC: 4.15 MIL/uL (ref 3.87–5.11)
RDW: 12.3 % (ref 11.5–15.5)
WBC: 4.7 10*3/uL (ref 4.0–10.5)
nRBC: 0 % (ref 0.0–0.2)

## 2020-07-25 LAB — TROPONIN I (HIGH SENSITIVITY): Troponin I (High Sensitivity): 9 ng/L (ref <18)

## 2020-07-25 LAB — LIPASE, BLOOD: Lipase: 79 U/L — ABNORMAL HIGH (ref 11–51)

## 2020-07-25 MED ORDER — OXYCODONE-ACETAMINOPHEN 5-325 MG PO TABS
2.0000 | ORAL_TABLET | Freq: Once | ORAL | Status: AC
  Administered 2020-07-25: 2 via ORAL
  Filled 2020-07-25: qty 2

## 2020-07-25 MED ORDER — OXYCODONE-ACETAMINOPHEN 5-325 MG PO TABS
1.0000 | ORAL_TABLET | Freq: Four times a day (QID) | ORAL | 0 refills | Status: AC | PRN
Start: 2020-07-25 — End: 2021-07-25

## 2020-07-25 NOTE — ED Provider Notes (Signed)
[facility] Emergency Department Provider Note   ____________________________________________    I have reviewed the triage vital signs and the nursing notes.   HISTORY  Chief Complaint Hypertension     HPI Sarah Sosa is a 40 y.o. female who presents with complaints of high blood pressure, an episode of dizziness as well as abdominal pain.  Patient reports that she felt woozy when she woke up today, she got up and checked her blood pressure and found to be high in that made her feel lightheaded.  She is feeling better now after taking her blood pressure medications.  She does have a history of chronically elevated blood pressure.  She denies chest pain.  No shortness of breath or cough.  Has been having upper abdominal pain for the last week which she attributes to pancreatitis which she has had before.  Past Medical History:  Diagnosis Date  . Arthritis   . Asthma   . Chronic pain disorder 03/19/2018  . GERD (gastroesophageal reflux disease)   . Headache   . Heart murmur   . History of trichomoniasis 03/19/2018  . Hypertension   . Pancreatitis   . PID (pelvic inflammatory disease) 02/14/2018  . Uterine leiomyoma 03/19/2018    Patient Active Problem List   Diagnosis Date Noted  . Recurrent pancreatitis 06/04/2020  . Intractable nausea and vomiting   . Marijuana use   . Hypertensive urgency 04/03/2020  . Hypokalemia 04/03/2020  . Tobacco abuse 04/03/2020  . History of substance abuse ([place]) 04/03/2020  . Acute pancreatitis 12/09/2019  . Ileus ([place]) 04/21/2018  . Postoperative ileus ([place]) 04/20/2018  . Postoperative state 04/15/2018  . Uterine leiomyoma 03/19/2018  . Menorrhagia with regular cycle 03/19/2018  . Chronic pain disorder 03/19/2018  . Pelvic pain 03/19/2018  . Primary hypertension 03/19/2018    Past Surgical History:  Procedure Laterality Date  . ABDOMINAL HYSTERECTOMY    . CHOLECYSTECTOMY    . DENTAL SURGERY    .  HYSTERECTOMY ABDOMINAL WITH SALPINGECTOMY Bilateral 04/15/2018   Procedure: HYSTERECTOMY ABDOMINAL WITH BILATERAL SALPINGECTOMY;  Surgeon: Rubie Maid, MD;  Location: [facility] ORS;  Service: Gynecology;  Laterality: Bilateral;  . kenn surgery Bilateral   . KNEE ARTHROSCOPY Left 2012, 2013    Prior to Admission medications   Medication Sig Start Date End Date Taking? Authorizing Provider  oxyCODONE-acetaminophen (PERCOCET) 5-325 MG tablet Take 1 tablet by mouth every 6 (six) hours as needed for severe pain. 07/25/20 07/25/21 Yes Lavonia Drafts, MD  amLODipine (NORVASC) 10 MG tablet Take 1 tablet (10 mg total) by mouth daily. Blood pressure medication. 06/08/20 09/06/20  Enzo Bi, MD  cyclobenzaprine (FLEXERIL) 10 MG tablet Take 10 mg by mouth 3 (three) times daily. 04/27/20   [provider]  lisinopril (ZESTRIL) 20 MG tablet Take 1 tablet (20 mg total) by mouth 2 (two) times daily at 10 AM and 5 PM. Patient taking differently: Take 20 mg by mouth at bedtime.  04/05/20 06/04/20  Nolberto Hanlon, MD  montelukast (SINGULAIR) 10 MG tablet Take 10 mg by mouth daily. 03/30/20   [provider]  ondansetron (ZOFRAN) 4 MG tablet Take 1 tablet (4 mg total) by mouth every 8 (eight) hours as needed for up to 10 doses for nausea or vomiting. 06/01/20   Lucrezia Starch, MD  PROAIR HFA 108 [phone] Base) MCG/ACT inhaler Inhale 2 puffs into the lungs every 4 (four) hours as needed. 03/30/20   [provider]  sucralfate (CARAFATE) 1 g  tablet Take 1 tablet (1 g total) by mouth 4 (four) times daily for 15 days. 04/30/20 05/15/20  Lavonia Drafts, MD  SYMBICORT 160-4.5 MCG/ACT inhaler Inhale 2 puffs into the lungs 2 (two) times daily. 03/30/20   [provider]  XTAMPZA ER 13.5 MG C12A Take 1 capsule by mouth every 8 (eight) hours. 06/01/20   [provider]     Allergies Latex, Nsaids, Tomato, and Tramadol  Family History  Problem Relation Age of Onset  . Fibroids Mother   .  Hypertension Mother   . Arthritis Mother     Social History Social History   Tobacco Use  . Smoking status: Current Some Day Smoker    Packs/day: 0.50    Years: 22.00    Pack years: 11.00    Types: Cigarettes  . Smokeless tobacco: Never Used  Vaping Use  . Vaping Use: Never used  Substance Use Topics  . Alcohol use: Yes    Comment: occasional  . Drug use: Yes    Types: Marijuana    Review of Systems  Constitutional: No fever/chills Eyes: No visual changes.  ENT: No sore throat. Cardiovascular: As above Respiratory: Denies shortness of breath. Gastrointestinal: As above Genitourinary: Negative for dysuria. Musculoskeletal: Negative for back pain. Skin: Negative for rash. Neurological: Negative for headaches or weakness   ____________________________________________   PHYSICAL EXAM:  VITAL SIGNS: ED Triage Vitals  Enc Vitals Group     BP 07/25/20 2011 (!) 198/91     Pulse Rate 07/25/20 2011 89     Resp 07/25/20 2011 18     Temp 07/25/20 2011 99 F (37.2 C)     Temp Source 07/25/20 2011 Oral     SpO2 07/25/20 2011 100 %     Weight 07/25/20 2014 96.2 kg (212 lb)     Height 07/25/20 2014 1.702 m (5\' 7" )     Head Circumference --      Peak Flow --      Pain Score 07/25/20 2013 9     Pain Loc --      Pain Edu? --      Excl. in [place]? --     Constitutional: Alert and oriented. No acute distress. Pleasant and interactive  Nose: No congestion/rhinnorhea. Mouth/Throat: Mucous membranes are moist.    Cardiovascular: Normal rate, regular rhythm. Kermit Balo peripheral circulation. Respiratory: Normal respiratory effort.  No retractions. Lungs CTAB. Gastrointestinal: Soft and nontender. No distention.  No CVA tenderness.  Reassuring exam  Musculoskeletal:  Warm and well perfused Neurologic:  Normal speech and language. No gross focal neurologic deficits are appreciated.  Skin:  Skin is warm, dry and intact. No rash noted. Psychiatric: Mood and affect are normal.  Speech and behavior are normal.  ____________________________________________   LABS (all labs ordered are listed, but only abnormal results are displayed)  Labs Reviewed  BASIC METABOLIC PANEL - Abnormal; Notable for the following components:      Result Value   Potassium 3.4 (*)    All other components within normal limits  LIPASE, BLOOD - Abnormal; Notable for the following components:   Lipase 79 (*)    All other components within normal limits  URINALYSIS, COMPLETE (UACMP) WITH MICROSCOPIC - Abnormal; Notable for the following components:   Color, Urine YELLOW (*)    APPearance CLOUDY (*)    Protein, ur 30 (*)    All other components within normal limits  CBC  TROPONIN I (HIGH SENSITIVITY)   ____________________________________________  EKG  ED ECG REPORT I, Lavonia Drafts, the attending physician, personally viewed and interpreted this ECG.  Date: 07/25/2020  Rhythm: normal sinus rhythm QRS Axis: normal Intervals: normal ST/T Wave abnormalities: normal Narrative Interpretation: Left ventricular hypertrophy  ____________________________________________  RADIOLOGY  None ____________________________________________   PROCEDURES  Procedure(s) performed: No  Procedures   Critical Care performed: No ____________________________________________   INITIAL IMPRESSION / ASSESSMENT AND PLAN / ED COURSE  Pertinent labs & imaging results that were available during my care of the patient were reviewed by me and considered in my medical decision making (see chart for details).  Patient presents with symptoms as noted above, only complaint at this time is abdominal pain in the epigastrium, exam is reassuring, lipase is mildly elevated.  I do suspect an element of pancreatitis.  Treated with p.o. analgesics with good improvement  EKG demonstrates LVH, troponin is normal.  Lab work is otherwise unremarkable.  The patient is feeling well, recommend outpatient  follow-up for medication adjustment for high blood pressure, return precautions discussed    ____________________________________________   FINAL CLINICAL IMPRESSION(S) / ED DIAGNOSES  Final diagnoses:  Primary hypertension  Acute pancreatitis, unspecified complication status, unspecified pancreatitis type        Note:  This document was prepared using Dragon voice recognition software and may include unintentional dictation errors.   Lavonia Drafts, MD 07/25/20 [phone]

## 2020-07-25 NOTE — ED Notes (Signed)
John ED Tech attempted x1 for bloodwork without success.

## 2020-07-25 NOTE — ED Notes (Signed)
This RN attempted x1 to obtain bloodwork without success. Lab called to obtain blood samples.

## 2020-07-25 NOTE — ED Triage Notes (Signed)
Pt BIB EMS, was recently placed on new HTN meds (Norvac) a couple of months ago. EMS was called out for HTN and and near-syncope, BP with EMS 240/160. Pt states prior to calling EMS, pt stood up and experienced lightheadedness with diaphoresis and chest tightness then took her Norvac. BP currently 198/91. Pt states chest tightness, lightheadedness has subsided.  Pt also endorses RUQ pain since Wednesday, hx pancreatitis.

## 2021-05-22 DIAGNOSIS — Z87898 Personal history of other specified conditions: Secondary | ICD-10-CM

## 2021-09-30 ENCOUNTER — Other Ambulatory Visit: Payer: Self-pay

## 2021-09-30 ENCOUNTER — Emergency Department
Admission: EM | Admit: 2021-09-30 | Discharge: 2021-09-30 | Disposition: A | Discharge status: Home / Self Care | Payer: Medicaid Other | Attending: Emergency Medicine | Admitting: Emergency Medicine

## 2021-09-30 DIAGNOSIS — R1013 Epigastric pain: Secondary | ICD-10-CM | POA: Diagnosis present

## 2021-09-30 DIAGNOSIS — K861 Other chronic pancreatitis: Secondary | ICD-10-CM | POA: Insufficient documentation

## 2021-09-30 DIAGNOSIS — J45909 Unspecified asthma, uncomplicated: Secondary | ICD-10-CM | POA: Diagnosis not present

## 2021-09-30 DIAGNOSIS — I10 Essential (primary) hypertension: Secondary | ICD-10-CM | POA: Insufficient documentation

## 2021-09-30 DIAGNOSIS — F17200 Nicotine dependence, unspecified, uncomplicated: Secondary | ICD-10-CM | POA: Diagnosis not present

## 2021-09-30 DIAGNOSIS — R7981 Abnormal blood-gas level: Secondary | ICD-10-CM | POA: Insufficient documentation

## 2021-09-30 LAB — COMPREHENSIVE METABOLIC PANEL
ALT: 13 U/L (ref 0–44)
AST: 14 U/L — ABNORMAL LOW (ref 15–41)
Albumin: 3.9 g/dL (ref 3.5–5.0)
Alkaline Phosphatase: 57 U/L (ref 38–126)
Anion gap: 10 (ref 5–15)
BUN: 15 mg/dL (ref 6–20)
CO2: 21 mmol/L — ABNORMAL LOW (ref 22–32)
Calcium: 9.8 mg/dL (ref 8.9–10.3)
Chloride: 106 mmol/L (ref 98–111)
Creatinine, Ser: 0.74 mg/dL (ref 0.44–1.00)
GFR, Estimated: >60 mL/min (ref >60)
Glucose, Bld: 129 mg/dL — ABNORMAL HIGH (ref 70–99)
Potassium: 3.7 mmol/L (ref 3.5–5.1)
Sodium: 137 mmol/L (ref 135–145)
Total Bilirubin: 0.5 mg/dL (ref 0.3–1.2)
Total Protein: 7.8 g/dL (ref 6.5–8.1)

## 2021-09-30 LAB — CBC
HCT: 43.1 % (ref 36.0–46.0)
Hemoglobin: 14.5 g/dL (ref 12.0–15.0)
MCH: 31.1 pg (ref 26.0–34.0)
MCHC: 33.6 g/dL (ref 30.0–36.0)
MCV: 92.5 fL (ref 80.0–100.0)
Platelets: 285 10*3/uL (ref 150–400)
RBC: 4.66 MIL/uL (ref 3.87–5.11)
RDW: 11.7 % (ref 11.5–15.5)
WBC: 4.5 10*3/uL (ref 4.0–10.5)
nRBC: 0 % (ref 0.0–0.2)

## 2021-09-30 LAB — LIPASE, BLOOD: Lipase: 23 U/L (ref 11–51)

## 2021-09-30 MED ORDER — ONDANSETRON HCL 4 MG/2ML IJ SOLN
4.0000 mg | Freq: Once | INTRAMUSCULAR | Status: AC
  Administered 2021-09-30: 4 mg via INTRAVENOUS
  Filled 2021-09-30: qty 2

## 2021-09-30 MED ORDER — OXYCODONE-ACETAMINOPHEN 5-325 MG PO TABS
1.0000 | ORAL_TABLET | ORAL | Status: AC | PRN
  Administered 2021-09-30 (×2): 1 via ORAL
  Filled 2021-09-30 (×2): qty 1

## 2021-09-30 MED ORDER — SODIUM CHLORIDE 0.9 % IV BOLUS
1000.0000 mL | Freq: Once | INTRAVENOUS | Status: AC
  Administered 2021-09-30: 1000 mL via INTRAVENOUS

## 2021-09-30 MED ORDER — HYDROMORPHONE HCL 1 MG/ML IJ SOLN
1.0000 mg | Freq: Once | INTRAMUSCULAR | Status: AC
  Administered 2021-09-30: 1 mg via INTRAVENOUS
  Filled 2021-09-30: qty 1

## 2021-09-30 MED ORDER — OXYCODONE HCL 5 MG PO TABS: 5.0000 mg | ORAL_TABLET | Freq: Three times a day (TID) | ORAL | 0 refills | Status: AC | PRN

## 2021-09-30 NOTE — ED Triage Notes (Signed)
Pt c/o pancreatic pain, N/V/D. Pt reports LUQ pain with radiation to left back that started last night. Pt gallbladder removed and states recently diagnosed with chronic pancreatitis.  ?

## 2021-09-30 NOTE — ED Notes (Addendum)
Pt ambulatory to restroom. Pt did not provide urine sample though she did urinate.  ?

## 2021-09-30 NOTE — ED Notes (Signed)
Pt prompted to provide urine sample when able. Call bell within reach.  ?

## 2021-09-30 NOTE — ED Provider Notes (Signed)
? ?[facility] ?Provider Note ? ? ? Event Date/Time  ? First MD Initiated Contact with Patient 09/30/21 1820   ?  (approximate) ? ? ?History  ? ?Abdominal Pain ? ? ?HPI ? ?Sarah Sosa is a 41 y.o. female with past medical history of chronic pancreatitis, alcohol use disorder, GERD, hypertension who presents with abdominal pain.  Symptoms started about 2 days ago.  She endorses pain in epigastric region that radiates to her back with associated nonbilious nonbloody emesis and diarrhea.  Says the pain feels typical of her pancreatitis pain.  Last think she had a flare about a month ago.  Says she is a social drinker but denies daily alcohol use. ? ?  ? ?Past Medical History:  ?Diagnosis Date  ? Arthritis   ? Asthma   ? Chronic pain disorder 03/19/2018  ? GERD (gastroesophageal reflux disease)   ? Headache   ? Heart murmur   ? History of trichomoniasis 03/19/2018  ? Hypertension   ? Pancreatitis   ? PID (pelvic inflammatory disease) 02/14/2018  ? Uterine leiomyoma 03/19/2018  ? ? ?Patient Active Problem List  ? Diagnosis Date Noted  ? Recurrent pancreatitis 06/04/2020  ? Intractable nausea and vomiting   ? Marijuana use   ? Hypertensive urgency 04/03/2020  ? Hypokalemia 04/03/2020  ? Tobacco abuse 04/03/2020  ? History of substance abuse ([place]) 04/03/2020  ? Acute pancreatitis 12/09/2019  ? Ileus ([place]) 04/21/2018  ? Postoperative ileus ([address]) 04/20/2018  ? Postoperative state 04/15/2018  ? Uterine leiomyoma 03/19/2018  ? Menorrhagia with regular cycle 03/19/2018  ? Chronic pain disorder 03/19/2018  ? Pelvic pain 03/19/2018  ? Primary hypertension 03/19/2018  ? ? ? ?Physical Exam  ?Triage Vital Signs: ?ED Triage Vitals  ?Enc Vitals Group  ?   BP 09/30/21 1735 (!) 166/102  ?   Pulse Rate 09/30/21 1735 88  ?   Resp 09/30/21 1735 18  ?   Temp 09/30/21 1735 98.1 ?F (36.7 ?C)  ?   Temp src --   ?   SpO2 09/30/21 1735 97 %  ?   Weight 09/30/21 1736 231 lb (104.8 kg)  ?   Height 09/30/21 1736 '5\' 6"'$  (1.676 m)  ?    Head Circumference --   ?   Peak Flow --   ?   Pain Score --   ?   Pain Loc --   ?   Pain Edu? --   ?   Excl. in [place]? --   ? ? ?Most recent vital signs: ?Vitals:  ? 09/30/21 1735  ?BP: (!) 166/102  ?Pulse: 88  ?Resp: 18  ?Temp: 98.1 ?F (36.7 ?C)  ?SpO2: 97%  ? ? ? ?General: Awake, no distress.  ?CV:  Good peripheral perfusion.  ?Resp:  Normal effort.  ?Abd:  No distention. Ttp in the mid epigastric region ?Neuro:             Awake, Alert, Oriented x 3  ?Other:   ? ? ?ED Results / Procedures / Treatments  ?Labs ?(all labs ordered are listed, but only abnormal results are displayed) ?Labs Reviewed  ?COMPREHENSIVE METABOLIC PANEL - Abnormal; Notable for the following components:  ?    Result Value  ? CO2 21 (*)   ? Glucose, Bld 129 (*)   ? AST 14 (*)   ? All other components within normal limits  ?LIPASE, BLOOD  ?CBC  ?URINALYSIS, ROUTINE W REFLEX MICROSCOPIC  ?POC URINE PREG, ED  ? ? ? ?  EKG ? ? ? ?RADIOLOGY ? ? ? ?PROCEDURES: ? ?Critical Care performed: No ? ?Procedures ? ? ? ?MEDICATIONS ORDERED IN ED: ?Medications  ?oxyCODONE-acetaminophen (PERCOCET/ROXICET) 5-325 MG per tablet 1 tablet (1 tablet Oral Given 09/30/21 1744)  ?HYDROmorphone (DILAUDID) injection 1 mg (1 mg Intravenous Given 09/30/21 1905)  ?sodium chloride 0.9 % bolus 1,000 mL (1,000 mLs Intravenous New Bag/Given 09/30/21 1906)  ?ondansetron [facility]) injection 4 mg (4 mg Intravenous Given 09/30/21 1903)  ? ? ? ?IMPRESSION / MDM / ASSESSMENT AND PLAN / ED COURSE  ?I reviewed the triage vital signs and the nursing notes. ?             ?               ? ?Differential diagnosis includes, but is not limited to, acute pancreatitis, exacerbation of chronic pancreatitis, gastritis, alcoholic gastritis, hepatitis, biliary pathology ? ?Patient is a 41 year old female with history of chronic pancreatitis who presents with flare of her typical pancreatitis pain.  This is associated with vomiting and diarrhea.  Pain is located epigastric region and radiates to the back  and she says that this is really unchanged from her prior episodes.  Last seen at [facility] about 9 days ago for similar and is followed up with her PCP since.  Last CT that I can see from 2/28 without pseudocyst or any other acute pathology.  Patient's labs today are overall benign she has no leukocytosis, bicarb mildly low on CMP likely in the setting of vomiting to 21, otherwise electrolytes are normal.  Lipase also reassuring and LFTs normal.  Vital signs notable for mild hypertension but otherwise within normal limits.  Patient appears well on exam she is tender in the epigastric region but abdomen overall benign.  Given this is essentially unchanged from her prior presentations do not feel that imaging is necessary at this time.  We will control her pain with fluids Dilaudid and Zofran. ? ?Pt appears comfortable after dilaudid. Pain is improved, tolerating fluids. Pt tells me she last was prescribed 1- of the oxy 5s on 3/10 but has run out. She is not able to get in to see her PCP soon bc they are full.  I confirmed in the [place] that this was the last time she was prescribed the Oxy.  Will prescribe her a short course to bridge her to further follow-up.  Ultimately I stressed the importance of alcohol cessation and avoiding triggering foods. She has already been referred to GI.  ? ?  ? ? ?FINAL CLINICAL IMPRESSION(S) / ED DIAGNOSES  ? ?Final diagnoses:  ?Chronic pancreatitis, unspecified pancreatitis type ([place])  ? ? ? ?Rx / DC Orders  ? ?ED Discharge Orders   ? ?      Ordered  ?  oxyCODONE (ROXICODONE) 5 MG immediate release tablet  Every 8 hours PRN       ? 09/30/21 1955  ? ?  ?  ? ?  ? ? ? ?Note:  This document was prepared using Dragon voice recognition software and may include unintentional dictation errors. ?  ?Rada Hay, MD ?09/30/21 1956 ? ?

## 2021-10-08 ENCOUNTER — Other Ambulatory Visit: Payer: Self-pay

## 2021-10-08 ENCOUNTER — Emergency Department: Payer: Medicaid Other

## 2021-10-08 ENCOUNTER — Emergency Department
Admission: EM | Admit: 2021-10-08 | Discharge: 2021-10-08 | Disposition: A | Discharge status: Home / Self Care | Payer: Medicaid Other | Attending: Emergency Medicine | Admitting: Emergency Medicine

## 2021-10-08 DIAGNOSIS — R1010 Upper abdominal pain, unspecified: Secondary | ICD-10-CM | POA: Diagnosis present

## 2021-10-08 DIAGNOSIS — K2900 Acute gastritis without bleeding: Secondary | ICD-10-CM

## 2021-10-08 DIAGNOSIS — K2970 Gastritis, unspecified, without bleeding: Secondary | ICD-10-CM | POA: Insufficient documentation

## 2021-10-08 LAB — COMPREHENSIVE METABOLIC PANEL
ALT: 10 U/L (ref 0–44)
AST: 14 U/L — ABNORMAL LOW (ref 15–41)
Albumin: 3.3 g/dL — ABNORMAL LOW (ref 3.5–5.0)
Alkaline Phosphatase: 50 U/L (ref 38–126)
Anion gap: 9 (ref 5–15)
BUN: 9 mg/dL (ref 6–20)
CO2: 23 mmol/L (ref 22–32)
Calcium: 8.8 mg/dL — ABNORMAL LOW (ref 8.9–10.3)
Chloride: 107 mmol/L (ref 98–111)
Creatinine, Ser: 0.71 mg/dL (ref 0.44–1.00)
GFR, Estimated: >60 mL/min (ref >60)
Glucose, Bld: 108 mg/dL — ABNORMAL HIGH (ref 70–99)
Potassium: 3.6 mmol/L (ref 3.5–5.1)
Sodium: 139 mmol/L (ref 135–145)
Total Bilirubin: 0.4 mg/dL (ref 0.3–1.2)
Total Protein: 6.8 g/dL (ref 6.5–8.1)

## 2021-10-08 LAB — CBC
HCT: 38.5 % (ref 36.0–46.0)
Hemoglobin: 12.5 g/dL (ref 12.0–15.0)
MCH: 31.2 pg (ref 26.0–34.0)
MCHC: 32.5 g/dL (ref 30.0–36.0)
MCV: 96 fL (ref 80.0–100.0)
Platelets: 156 10*3/uL (ref 150–400)
RBC: 4.01 MIL/uL (ref 3.87–5.11)
RDW: 11.9 % (ref 11.5–15.5)
WBC: 4.3 10*3/uL (ref 4.0–10.5)
nRBC: 0 % (ref 0.0–0.2)

## 2021-10-08 LAB — LIPASE, BLOOD: Lipase: 33 U/L (ref 11–51)

## 2021-10-08 MED ORDER — ONDANSETRON HCL 4 MG/2ML IJ SOLN
4.0000 mg | Freq: Once | INTRAMUSCULAR | Status: AC
  Administered 2021-10-08: 4 mg via INTRAVENOUS
  Filled 2021-10-08: qty 2

## 2021-10-08 MED ORDER — PANTOPRAZOLE SODIUM 40 MG PO TBEC: 40.0000 mg | DELAYED_RELEASE_TABLET | Freq: Every day | ORAL | 1 refills | Status: DC

## 2021-10-08 MED ORDER — MORPHINE SULFATE (PF) 4 MG/ML IV SOLN
4.0000 mg | Freq: Once | INTRAVENOUS | Status: AC
  Administered 2021-10-08: 4 mg via INTRAVENOUS
  Filled 2021-10-08: qty 1

## 2021-10-08 MED ORDER — IOHEXOL 300 MG/ML  SOLN
100.0000 mL | Freq: Once | INTRAMUSCULAR | Status: AC | PRN
  Administered 2021-10-08: 100 mL via INTRAVENOUS

## 2021-10-08 MED ORDER — SUCRALFATE 1 G PO TABS: 1.0000 g | ORAL_TABLET | Freq: Four times a day (QID) | ORAL | 1 refills | Status: DC

## 2021-10-08 MED ORDER — SODIUM CHLORIDE 0.9 % IV BOLUS
1000.0000 mL | Freq: Once | INTRAVENOUS | Status: AC
  Administered 2021-10-08: 1000 mL via INTRAVENOUS

## 2021-10-08 NOTE — ED Provider Notes (Signed)
? ?[facility] ?Provider Note ? ? ? Event Date/Time  ? First MD Initiated Contact with Patient 10/08/21 1725   ?  (approximate) ? ?History  ? ?Chief Complaint: chronic pancreatitis ? ?HPI ? ?Sarah Sosa is a 41 y.o. female with a past medical history of gastric reflux, pancreatitis, alcohol use, presents to the emergency department for upper abdominal pain.  According to the patient over the past week or so she has been experiencing upper abdominal pain nausea and vomiting.  Patient states it feels consistent with past episodes of pancreatitis.  Patient states she is not drinking any alcohol in several days.  Denies any fever or diarrhea.  Patient was seen for the same 09/30/2021.  I reviewed the patient's lab work at that time with a normal lipase, no CT imaging recently. ? ?Physical Exam  ? ?Triage Vital Signs: ?ED Triage Vitals  ?Enc Vitals Group  ?   BP 10/08/21 1718 (!) 173/84  ?   Pulse Rate 10/08/21 1718 67  ?   Resp 10/08/21 1718 18  ?   Temp 10/08/21 1718 97.9 ?F (36.6 ?C)  ?   Temp Source 10/08/21 1718 Oral  ?   SpO2 10/08/21 1718 96 %  ?   Weight 10/08/21 1716 231 lb (104.8 kg)  ?   Height 10/08/21 1716 '5\' 7"'$  (1.702 m)  ?   Head Circumference --   ?   Peak Flow --   ?   Pain Score 10/08/21 1715 10  ?   Pain Loc --   ?   Pain Edu? --   ?   Excl. in [place]? --   ? ? ?Most recent vital signs: ?Vitals:  ? 10/08/21 1718  ?BP: (!) 173/84  ?Pulse: 67  ?Resp: 18  ?Temp: 97.9 ?F (36.6 ?C)  ?SpO2: 96%  ? ? ?General: Awake, no distress.  ?CV:  Good peripheral perfusion.  Regular rate and rhythm  ?Resp:  Normal effort.  Equal breath sounds bilaterally.  ?Abd:  No distention.  Soft moderate epigastric tenderness otherwise benign abdomen. ? ? ?ED Results / Procedures / Treatments  ? ?RADIOLOGY ? ?I personally reviewed the CT images, no significant abnormality seen on my evaluation. ?Radiology is read the CT scan as negative for acute abnormality ? ? ?MEDICATIONS ORDERED IN ED: ?Medications  ?morphine  (PF) 4 MG/ML injection 4 mg (has no administration in time range)  ?ondansetron (ZOFRAN) injection 4 mg (has no administration in time range)  ?sodium chloride 0.9 % bolus 1,000 mL (has no administration in time range)  ? ? ? ?IMPRESSION / MDM / ASSESSMENT AND PLAN / ED COURSE  ?I reviewed the triage vital signs and the nursing notes. ? ?Patient presents emergency department for epigastric pain over the past 1 week.  Patient states history of chronic pancreatitis which this feels identical.  States nausea vomiting denies any diarrhea denies any fever.  Differential would include pancreatitis also biliary pathology, gastritis or gastroenteritis, peptic ulcer disease.  We will check labs including LFTs and lipase.  We will treat pain nausea and IV hydrate while awaiting results.  Patient agreeable to plan of care. ? ?Patient's work-up is overall reassuring.  Lab work is largely within normal limits including normal LFTs.  Normal lipase.  Normal CBC.  CT scan is nonrevealing.  Suspect possible gastritis.  I went to discuss the findings with the patient she is currently eating [employer] in the room.  I discussed with the patient trying to avoid fatty  or oily or spicy foods for the next 2 weeks.  We will discharge on Protonix and Carafate have the patient follow-up with GI medicine.  Patient agreeable to plan. ? ?FINAL CLINICAL IMPRESSION(S) / ED DIAGNOSES  ? ?Epigastric pain ?Gastritis ? ?Rx / DC Orders  ? ?Protonix ?Sucralfate ?GI follow-up ? ?Note:  This document was prepared using Dragon voice recognition software and may include unintentional dictation errors. ?  Harvest Dark, MD ?10/08/21 2104 ? ?

## 2021-10-08 NOTE — ED Triage Notes (Signed)
Pt comes pov with left and centered abd pain. Hx of chronic pancreatitis and states she has having another flair up.  ?

## 2021-10-08 NOTE — ED Notes (Signed)
Pt is requesting pain medication. 

## 2021-10-08 NOTE — ED Notes (Signed)
This RN requested urine specimen from pt. Pt. States she cannot get up to the bathroom. Will attempt in a bit after she has had her IV fluids. ?

## 2021-10-08 NOTE — ED Notes (Signed)
E-signature pad unavailable - Pt verbalized understanding of D/C information - no additional concerns at this time.  

## 2021-10-12 ENCOUNTER — Emergency Department: Payer: Medicaid Other

## 2021-10-12 ENCOUNTER — Other Ambulatory Visit: Payer: Self-pay

## 2021-10-12 ENCOUNTER — Emergency Department
Admission: EM | Admit: 2021-10-12 | Discharge: 2021-10-12 | Disposition: A | Discharge status: Home / Self Care | Payer: Medicaid Other | Attending: Emergency Medicine | Admitting: Emergency Medicine

## 2021-10-12 DIAGNOSIS — R197 Diarrhea, unspecified: Secondary | ICD-10-CM | POA: Insufficient documentation

## 2021-10-12 DIAGNOSIS — I10 Essential (primary) hypertension: Secondary | ICD-10-CM | POA: Insufficient documentation

## 2021-10-12 DIAGNOSIS — R112 Nausea with vomiting, unspecified: Secondary | ICD-10-CM | POA: Diagnosis present

## 2021-10-12 DIAGNOSIS — R739 Hyperglycemia, unspecified: Secondary | ICD-10-CM

## 2021-10-12 DIAGNOSIS — K920 Hematemesis: Secondary | ICD-10-CM | POA: Diagnosis not present

## 2021-10-12 DIAGNOSIS — R7309 Other abnormal glucose: Secondary | ICD-10-CM | POA: Insufficient documentation

## 2021-10-12 DIAGNOSIS — R1084 Generalized abdominal pain: Secondary | ICD-10-CM | POA: Diagnosis not present

## 2021-10-12 DIAGNOSIS — J45909 Unspecified asthma, uncomplicated: Secondary | ICD-10-CM | POA: Diagnosis not present

## 2021-10-12 DIAGNOSIS — E876 Hypokalemia: Secondary | ICD-10-CM | POA: Insufficient documentation

## 2021-10-12 DIAGNOSIS — Z20822 Contact with and (suspected) exposure to covid-19: Secondary | ICD-10-CM | POA: Diagnosis not present

## 2021-10-12 LAB — CBC
HCT: 39.8 % (ref 36.0–46.0)
Hemoglobin: 12.9 g/dL (ref 12.0–15.0)
MCH: 30.3 pg (ref 26.0–34.0)
MCHC: 32.4 g/dL (ref 30.0–36.0)
MCV: 93.4 fL (ref 80.0–100.0)
Platelets: 250 10*3/uL (ref 150–400)
RBC: 4.26 MIL/uL (ref 3.87–5.11)
RDW: 12.1 % (ref 11.5–15.5)
WBC: 4 10*3/uL (ref 4.0–10.5)
nRBC: 0 % (ref 0.0–0.2)

## 2021-10-12 LAB — COMPREHENSIVE METABOLIC PANEL
ALT: 12 U/L (ref 0–44)
AST: 17 U/L (ref 15–41)
Albumin: 3.5 g/dL (ref 3.5–5.0)
Alkaline Phosphatase: 48 U/L (ref 38–126)
Anion gap: 8 (ref 5–15)
BUN: 13 mg/dL (ref 6–20)
CO2: 22 mmol/L (ref 22–32)
Calcium: 8.6 mg/dL — ABNORMAL LOW (ref 8.9–10.3)
Chloride: 105 mmol/L (ref 98–111)
Creatinine, Ser: 0.84 mg/dL (ref 0.44–1.00)
GFR, Estimated: >60 mL/min (ref >60)
Glucose, Bld: 203 mg/dL — ABNORMAL HIGH (ref 70–99)
Potassium: 3.4 mmol/L — ABNORMAL LOW (ref 3.5–5.1)
Sodium: 135 mmol/L (ref 135–145)
Total Bilirubin: 0.6 mg/dL (ref 0.3–1.2)
Total Protein: 7.2 g/dL (ref 6.5–8.1)

## 2021-10-12 LAB — URINALYSIS, ROUTINE W REFLEX MICROSCOPIC
Bilirubin Urine: NEGATIVE
Glucose, UA: NEGATIVE mg/dL
Hgb urine dipstick: NEGATIVE
Ketones, ur: NEGATIVE mg/dL
Leukocytes,Ua: NEGATIVE
Nitrite: NEGATIVE
Protein, ur: NEGATIVE mg/dL
Specific Gravity, Urine: 1.023 (ref 1.005–1.030)
pH: 7 (ref 5.0–8.0)

## 2021-10-12 LAB — RESP PANEL BY RT-PCR (FLU A&B, COVID) ARPGX2
Influenza A by PCR: NEGATIVE
Influenza B by PCR: NEGATIVE
SARS Coronavirus 2 by RT PCR: NEGATIVE

## 2021-10-12 LAB — MAGNESIUM: Magnesium: 1.5 mg/dL — ABNORMAL LOW (ref 1.7–2.4)

## 2021-10-12 LAB — PREGNANCY, URINE: Preg Test, Ur: NEGATIVE

## 2021-10-12 LAB — LIPASE, BLOOD: Lipase: 24 U/L (ref 11–51)

## 2021-10-12 LAB — TROPONIN I (HIGH SENSITIVITY): Troponin I (High Sensitivity): 5 ng/L (ref <18)

## 2021-10-12 MED ORDER — ONDANSETRON 4 MG PO TBDP: 4.0000 mg | ORAL_TABLET | Freq: Three times a day (TID) | ORAL | 0 refills | Status: AC | PRN

## 2021-10-12 MED ORDER — SUCRALFATE 1 G PO TABS
1.0000 g | ORAL_TABLET | Freq: Once | ORAL | Status: AC
  Administered 2021-10-12: 1 g via ORAL
  Filled 2021-10-12: qty 1

## 2021-10-12 MED ORDER — ACETAMINOPHEN 325 MG PO TABS
650.0000 mg | ORAL_TABLET | Freq: Once | ORAL | Status: AC
  Administered 2021-10-12: 650 mg via ORAL
  Filled 2021-10-12: qty 2

## 2021-10-12 MED ORDER — OXYCODONE-ACETAMINOPHEN 5-325 MG PO TABS
1.0000 | ORAL_TABLET | ORAL | Status: AC | PRN
  Administered 2021-10-12 (×2): 1 via ORAL
  Filled 2021-10-12 (×3): qty 1

## 2021-10-12 MED ORDER — POTASSIUM CHLORIDE 20 MEQ PO PACK: 20.0000 meq | PACK | Freq: Two times a day (BID) | ORAL | Status: DC

## 2021-10-12 MED ORDER — ONDANSETRON 4 MG PO TBDP
4.0000 mg | ORAL_TABLET | Freq: Once | ORAL | Status: AC | PRN
Start: 2021-10-12 — End: 2021-10-12
  Administered 2021-10-12: 4 mg via ORAL
  Filled 2021-10-12: qty 1

## 2021-10-12 MED ORDER — LACTATED RINGERS IV BOLUS
1000.0000 mL | Freq: Once | INTRAVENOUS | Status: AC
Start: 2021-10-12 — End: 2021-10-12
  Administered 2021-10-12: 1000 mL via INTRAVENOUS

## 2021-10-12 MED ORDER — ALUM & MAG HYDROXIDE-SIMETH 200-200-20 MG/5ML PO SUSP
30.0000 mL | Freq: Once | ORAL | Status: AC
  Administered 2021-10-12: 30 mL via ORAL
  Filled 2021-10-12: qty 30

## 2021-10-12 MED ORDER — MORPHINE SULFATE (PF) 4 MG/ML IV SOLN
4.0000 mg | Freq: Once | INTRAVENOUS | Status: AC
  Administered 2021-10-12: 4 mg via INTRAVENOUS
  Filled 2021-10-12: qty 1

## 2021-10-12 MED ORDER — PANTOPRAZOLE SODIUM 40 MG IV SOLR
40.0000 mg | Freq: Once | INTRAVENOUS | Status: AC
  Administered 2021-10-12: 40 mg via INTRAVENOUS
  Filled 2021-10-12: qty 10

## 2021-10-12 MED ORDER — ONDANSETRON 4 MG PO TBDP: 4.0000 mg | ORAL_TABLET | Freq: Three times a day (TID) | ORAL | 0 refills | Status: DC | PRN

## 2021-10-12 MED ORDER — IOHEXOL 300 MG/ML  SOLN
100.0000 mL | Freq: Once | INTRAMUSCULAR | Status: AC | PRN
  Administered 2021-10-12: 100 mL via INTRAVENOUS
  Filled 2021-10-12: qty 100

## 2021-10-12 MED ORDER — DICYCLOMINE HCL 10 MG PO CAPS
10.0000 mg | ORAL_CAPSULE | Freq: Once | ORAL | Status: AC
  Administered 2021-10-12: 10 mg via ORAL
  Filled 2021-10-12: qty 1

## 2021-10-12 MED ORDER — MAGNESIUM SULFATE 4 GM/100ML IV SOLN
4.0000 g | Freq: Once | INTRAVENOUS | Status: AC
  Administered 2021-10-12: 4 g via INTRAVENOUS
  Filled 2021-10-12: qty 100

## 2021-10-12 NOTE — ED Notes (Signed)
Pt to ED for severe abdominal pain that started yesterday. Pt was vomiting yesterday and this morning. Pt states has chronic pancreatitis and gastritis. Denies urinary symptoms. Denies chest pain. ?

## 2021-10-12 NOTE — ED Notes (Signed)
Pt to CT

## 2021-10-12 NOTE — ED Triage Notes (Signed)
Pt comes with c/o abdominal pain that is left sided. Pt states this started yesterday and got worse. Pt states vomiting and blood in vomit. Pt has hx of pancreatitis. ?

## 2021-10-12 NOTE — ED Provider Notes (Signed)
? ?[facility] ?Provider Note ? ? ? Event Date/Time  ? First MD Initiated Contact with Patient 10/12/21 1440   ?  (approximate) ? ? ?History  ? ?Abdominal Pain ? ? ?HPI ? ?Sarah Sosa is a 41 y.o. female with past medical history of arthritis, asthma, GERD, chronic pain, hypertension, PID, and chronic pancreatitis which patient states is from some intermittent alcohol use although she does not currently drink as well as tobacco abuse and some intermittent THC use who presents for evaluation of some nausea, vomiting, diarrhea and some abdominal pain.  She states it is most severe in the left upper quadrant.  She does states it is a small dull ache in the right lower quadrant although this has been present for for 5 days and is not any worse today.  She states she is aware she has a cyst on the side..  No cough, shortness of breath, chest pain, fevers, headache, earache or sore throat although patient states she has noticed some blood streaks in her vomit.  No chest pain, headache, earache, sore throat, urinary symptoms rash or extremity pain.  She states she was seen in the emergency room on 3/25 and diagnosed with gastritis and sent a prescription for Carafate and Protonix but was able to pick them up until yesterday.  She has been too nauseous to take them. ? ?  ?Past Medical History:  ?Diagnosis Date  ? Arthritis   ? Asthma   ? Chronic pain disorder 03/19/2018  ? GERD (gastroesophageal reflux disease)   ? Headache   ? Heart murmur   ? History of trichomoniasis 03/19/2018  ? Hypertension   ? Pancreatitis   ? PID (pelvic inflammatory disease) 02/14/2018  ? Uterine leiomyoma 03/19/2018  ? ? ? ?Physical Exam  ?Triage Vital Signs: ?ED Triage Vitals [10/12/21 1306]  ?Enc Vitals Group  ?   BP (!) 153/83  ?   Pulse Rate 65  ?   Resp 17  ?   Temp 98.9 ?F (37.2 ?C)  ?   Temp Source Oral  ?   SpO2 96 %  ?   Weight 231 lb (104.8 kg)  ?   Height '5\' 7"'$  (1.702 m)  ?   Head Circumference   ?   Peak Flow   ?   Pain  Score 10  ?   Pain Loc   ?   Pain Edu?   ?   Excl. in [place]?   ? ? ?Most recent vital signs: ?Vitals:  ? 10/12/21 1530 10/12/21 1535  ?BP: 133/87   ?Pulse: (!) 58 (!) 58  ?Resp: 16   ?Temp:    ?SpO2: 94% 95%  ? ? ?General: Awake, appears uncomfortable. ?CV:  Slightly prolonged capillary refill in the digits.  2+ radial pulse. ?Resp:  Normal effort.  Clear bilaterally ?Abd:  No distention.  Tender in the left upper and lower quadrants.  Some mild left CVA tenderness.  No right-sided CVA tenderness. ?Other:   ? ? ?ED Results / Procedures / Treatments  ?Labs ?(all labs ordered are listed, but only abnormal results are displayed) ?Labs Reviewed  ?COMPREHENSIVE METABOLIC PANEL - Abnormal; Notable for the following components:  ?    Result Value  ? Potassium 3.4 (*)   ? Glucose, Bld 203 (*)   ? Calcium 8.6 (*)   ? All other components within normal limits  ?URINALYSIS, ROUTINE W REFLEX MICROSCOPIC - Abnormal; Notable for the following components:  ? Color, Urine YELLOW (*)   ?  APPearance HAZY (*)   ? All other components within normal limits  ?MAGNESIUM - Abnormal; Notable for the following components:  ? Magnesium 1.5 (*)   ? All other components within normal limits  ?RESP PANEL BY RT-PCR (FLU A&B, COVID) ARPGX2  ?LIPASE, BLOOD  ?CBC  ?PREGNANCY, URINE  ?HEMOGLOBIN A1C  ?POC URINE PREG, ED  ?TROPONIN I (HIGH SENSITIVITY)  ? ? ? ?EKG ? ?ECG is remarkable sinus bradycardia with a ventricular rate of 52, normal axis, unremarkable intervals with some nonspecific ST changes in V2, V3, lead III, aVF and aVL.  No other clear evidence of acute ischemia or significant arrhythmia. ? ? ?RADIOLOGY ? ?CT abdomen pelvis on my interpretation without evidence of diverticulitis, left-sided perinephric stranding, kidney stone or appendicitis.  Also reviewed radiologist interpretation and agree with their findings of a simple appearing right adnexal cyst as well as some trace pelvic free fluid which I suspect is likely physiologic and some  aortic atherosclerosis. ? ?Pelvic ultrasound my interpretation shows pulse-wave Doppler blood flow arterial and venous phases in both ovaries and a right-sided ovarian cyst without other acute process.  I also reviewed radiology interpretation. ? ? ?PROCEDURES: ? ?Critical Care performed: No ? ?Procedures ? ? ? ?MEDICATIONS ORDERED IN ED: ?Medications  ?potassium chloride (KLOR-CON) packet 20 mEq (has no administration in time range)  ?magnesium sulfate IVPB 4 g 100 mL (4 g Intravenous New Bag/Given 10/12/21 1708)  ?oxyCODONE-acetaminophen (PERCOCET/ROXICET) 5-325 MG per tablet 1 tablet (1 tablet Oral Given 10/12/21 1804)  ?ondansetron (ZOFRAN-ODT) disintegrating tablet 4 mg (4 mg Oral Given 10/12/21 1322)  ?lactated ringers bolus 1,000 mL (1,000 mLs Intravenous New Bag/Given 10/12/21 1528)  ?morphine (PF) 4 MG/ML injection 4 mg (4 mg Intravenous Given 10/12/21 1527)  ?pantoprazole (PROTONIX) injection 40 mg (40 mg Intravenous Given 10/12/21 1533)  ?iohexol (OMNIPAQUE) 300 MG/ML solution 100 mL (100 mLs Intravenous Contrast Given 10/12/21 1541)  ?dicyclomine (BENTYL) capsule 10 mg (10 mg Oral Given 10/12/21 1640)  ?alum & mag hydroxide-simeth (MAALOX/MYLANTA) 200-200-20 MG/5ML suspension 30 mL (30 mLs Oral Given 10/12/21 1640)  ?sucralfate (CARAFATE) tablet 1 g (1 g Oral Given 10/12/21 1709)  ?acetaminophen (TYLENOL) tablet 650 mg (650 mg Oral Given 10/12/21 1709)  ? ? ? ?IMPRESSION / MDM / ASSESSMENT AND PLAN / ED COURSE  ?I reviewed the triage vital signs and the nursing notes. ?             ?               ? ?Differential diagnosis includes, but is not limited to acute infectious gastroenteritis possibly causing a Mallory-Weiss tear, diverticulitis, pyelonephritis, kidney stone, cystitis symptoms and possible metabolic derangements from nausea and vomiting.  Patient has no chest pain or shortness of breath and stable vitals and overall I have a low suspicion for Boerhaave syndrome or full-thickness esophageal perforation.   She is only had a couple streaks of blood in her emesis and have a lower suspicion for significant GI hemorrhage at this point. ? ?ECG is remarkable sinus bradycardia with a ventricular rate of 52, normal axis, unremarkable intervals with some nonspecific ST changes in V2, V3, lead III, aVF and aVL.  No other clear evidence of acute ischemia or significant arrhythmia. ? ? ?CT abdomen pelvis on my interpretation without evidence of diverticulitis, left-sided perinephric stranding, kidney stone or appendicitis.  Also reviewed radiologist interpretation and agree with their findings of a simple appearing right adnexal cyst as well as some trace pelvic free  fluid which I suspect is likely physiologic and some aortic atherosclerosis. ? ?Pelvic ultrasound my interpretation shows pulse-wave Doppler blood flow arterial and venous phases in both ovaries and a right-sided ovarian cyst without other acute process.  I also reviewed radiology interpretation. ? ?CMP shows a K of 3.4, glucose of 203 without any other significant electrolyte or metabolic derangements.  No evidence of hepatitis or cholestasis.  Potassium repleted.  Patient has no history of diabetes and A1c was sent which I advised patient she can follow-up with her PCP.  Lipase not consistent with pancreatitis.  CBC without leukocytosis or acute anemia.  UA is unremarkable for evidence of blood or infection.  Magnesium is 1.5.  COVID influenza PCR is negative. ? ?I suspect patient's left upper quadrant pain nausea vomiting diarrhea as well as hematemesis is likely from gastritis possibly acute infectious.  Suspect are more dull right-sided discomfort over the last 4 to 5 days is from her hemorrhagic brain cyst.  She is feeling better on my reassessment.  She states she already has prescriptions for Protonix and Carafate but will add some for Protonix as reassessment she is now able tolerate p.o. and feeling better and I think she is appropriate for discharge with  close outpatient follow-up.  Advised her to hold her Lasix for 2 days and to have her electrolytes rechecked in 48 hours.  Also discussed discussed returning for any new or worsening symptoms.  He stat

## 2021-10-13 LAB — HEMOGLOBIN A1C
Hgb A1c MFr Bld: 6.2 % — ABNORMAL HIGH (ref 4.8–5.6)
Mean Plasma Glucose: 131.24 mg/dL

## 2021-10-19 ENCOUNTER — Emergency Department
Admission: EM | Admit: 2021-10-19 | Discharge: 2021-10-19 | Disposition: A | Discharge status: Home / Self Care | Payer: Medicaid Other | Attending: Student in an Organized Health Care Education/Training Program | Admitting: Student in an Organized Health Care Education/Training Program

## 2021-10-19 ENCOUNTER — Other Ambulatory Visit: Payer: Self-pay

## 2021-10-19 ENCOUNTER — Encounter: Payer: Self-pay | Admitting: Emergency Medicine

## 2021-10-19 DIAGNOSIS — R1013 Epigastric pain: Secondary | ICD-10-CM | POA: Insufficient documentation

## 2021-10-19 LAB — COMPREHENSIVE METABOLIC PANEL
ALT: 18 U/L (ref 0–44)
AST: 29 U/L (ref 15–41)
Albumin: 3.5 g/dL (ref 3.5–5.0)
Alkaline Phosphatase: 51 U/L (ref 38–126)
Anion gap: 10 (ref 5–15)
BUN: 16 mg/dL (ref 6–20)
CO2: 20 mmol/L — ABNORMAL LOW (ref 22–32)
Calcium: 8.8 mg/dL — ABNORMAL LOW (ref 8.9–10.3)
Chloride: 105 mmol/L (ref 98–111)
Creatinine, Ser: 0.79 mg/dL (ref 0.44–1.00)
GFR, Estimated: >60 mL/min (ref >60)
Glucose, Bld: 134 mg/dL — ABNORMAL HIGH (ref 70–99)
Potassium: 3.6 mmol/L (ref 3.5–5.1)
Sodium: 135 mmol/L (ref 135–145)
Total Bilirubin: 0.7 mg/dL (ref 0.3–1.2)
Total Protein: 7.1 g/dL (ref 6.5–8.1)

## 2021-10-19 LAB — CBC
HCT: 37.8 % (ref 36.0–46.0)
Hemoglobin: 12.5 g/dL (ref 12.0–15.0)
MCH: 30.7 pg (ref 26.0–34.0)
MCHC: 33.1 g/dL (ref 30.0–36.0)
MCV: 92.9 fL (ref 80.0–100.0)
Platelets: 285 10*3/uL (ref 150–400)
RBC: 4.07 MIL/uL (ref 3.87–5.11)
RDW: 11.9 % (ref 11.5–15.5)
WBC: 4.1 10*3/uL (ref 4.0–10.5)
nRBC: 0 % (ref 0.0–0.2)

## 2021-10-19 LAB — POC URINE PREG, ED: Preg Test, Ur: NEGATIVE

## 2021-10-19 LAB — URINALYSIS, ROUTINE W REFLEX MICROSCOPIC
Bilirubin Urine: NEGATIVE
Glucose, UA: NEGATIVE mg/dL
Hgb urine dipstick: NEGATIVE
Ketones, ur: NEGATIVE mg/dL
Leukocytes,Ua: NEGATIVE
Nitrite: NEGATIVE
Protein, ur: NEGATIVE mg/dL
Specific Gravity, Urine: 1.013 (ref 1.005–1.030)
pH: 6 (ref 5.0–8.0)

## 2021-10-19 LAB — LIPASE, BLOOD: Lipase: 23 U/L (ref 11–51)

## 2021-10-19 MED ORDER — MORPHINE SULFATE (PF) 4 MG/ML IV SOLN
4.0000 mg | INTRAVENOUS | Status: DC | PRN
Start: 2021-10-19 — End: 2021-10-19
  Administered 2021-10-19: 4 mg via INTRAVENOUS
  Filled 2021-10-19: qty 1

## 2021-10-19 MED ORDER — SODIUM CHLORIDE 0.9 % IV BOLUS
1000.0000 mL | Freq: Once | INTRAVENOUS | Status: AC
  Administered 2021-10-19: 1000 mL via INTRAVENOUS

## 2021-10-19 MED ORDER — OXYCODONE-ACETAMINOPHEN 5-325 MG PO TABS: 1.0000 | ORAL_TABLET | ORAL | 0 refills | Status: DC | PRN

## 2021-10-19 MED ORDER — ALUM & MAG HYDROXIDE-SIMETH 200-200-20 MG/5ML PO SUSP
30.0000 mL | Freq: Once | ORAL | Status: AC
  Administered 2021-10-19: 30 mL via ORAL
  Filled 2021-10-19: qty 30

## 2021-10-19 MED ORDER — OXYCODONE-ACETAMINOPHEN 5-325 MG PO TABS
1.0000 | ORAL_TABLET | Freq: Once | ORAL | Status: AC
  Administered 2021-10-19: 1 via ORAL
  Filled 2021-10-19: qty 1

## 2021-10-19 MED ORDER — ONDANSETRON HCL 4 MG/2ML IJ SOLN
4.0000 mg | Freq: Once | INTRAMUSCULAR | Status: AC
  Administered 2021-10-19: 4 mg via INTRAVENOUS
  Filled 2021-10-19: qty 2

## 2021-10-19 MED ORDER — LIDOCAINE VISCOUS HCL 2 % MT SOLN
15.0000 mL | Freq: Once | OROMUCOSAL | Status: AC
  Administered 2021-10-19: 15 mL via ORAL
  Filled 2021-10-19: qty 15

## 2021-10-19 NOTE — ED Provider Notes (Signed)
? ?[facility] ?Provider Note ? ? ? Event Date/Time  ? First MD Initiated Contact with Patient 10/19/21 [phone]   ?  (approximate) ? ? ?History  ? ?Abdominal Pain ? ? ?HPI ? ?Mystery Schrupp is a 41 y.o. female history of recurrent pancreatitis presents to the ER for evaluation of epigastric pain radiating through to her back she feels consistent with her pancreatitis.  Admit to drinking alcohol as well as spicy foods over the weekend.  Denies any fevers no cough no congestion no diarrhea no dysuria. ?  ? ? ?Physical Exam  ? ?Triage Vital Signs: ?ED Triage Vitals  ?Enc Vitals Group  ?   BP 10/19/21 0923 (!) 180/97  ?   Pulse Rate 10/19/21 0923 69  ?   Resp 10/19/21 0923 18  ?   Temp 10/19/21 0923 98.8 ?F (37.1 ?C)  ?   Temp Source 10/19/21 0923 Oral  ?   SpO2 10/19/21 0923 96 %  ?   Weight 10/19/21 0920 231 lb (104.8 kg)  ?   Height 10/19/21 0920 '5\' 7"'$  (1.702 m)  ?   Head Circumference --   ?   Peak Flow --   ?   Pain Score 10/19/21 0920 10  ?   Pain Loc --   ?   Pain Edu? --   ?   Excl. in [place]? --   ? ? ?Most recent vital signs: ?Vitals:  ? 10/19/21 0923  ?BP: (!) 180/97  ?Pulse: 69  ?Resp: 18  ?Temp: 98.8 ?F (37.1 ?C)  ?SpO2: 96%  ? ? ? ?Constitutional: Alert  ?Eyes: Conjunctivae are normal.  ?Head: Atraumatic. ?Nose: No congestion/rhinnorhea. ?Mouth/Throat: Mucous membranes are moist.   ?Neck: Painless ROM.  ?Cardiovascular:   Good peripheral circulation. ?Respiratory: Normal respiratory effort.  No retractions.  ?Gastrointestinal: Soft and nontender.  ?Musculoskeletal:  no deformity ?Neurologic:  MAE spontaneously. No gross focal neurologic deficits are appreciated.  ?Skin:  Skin is warm, dry and intact. No rash noted. ?Psychiatric: Mood and affect are normal. Speech and behavior are normal. ? ? ? ?ED Results / Procedures / Treatments  ? ?Labs ?(all labs ordered are listed, but only abnormal results are displayed) ?Labs Reviewed  ?COMPREHENSIVE METABOLIC PANEL - Abnormal; Notable for the  following components:  ?    Result Value  ? CO2 20 (*)   ? Glucose, Bld 134 (*)   ? Calcium 8.8 (*)   ? All other components within normal limits  ?URINALYSIS, ROUTINE W REFLEX MICROSCOPIC - Abnormal; Notable for the following components:  ? Color, Urine YELLOW (*)   ? APPearance HAZY (*)   ? All other components within normal limits  ?LIPASE, BLOOD  ?CBC  ?POC URINE PREG, ED  ? ? ? ?EKG ? ? ? ? ?RADIOLOGY ? ? ? ?PROCEDURES: ? ?Critical Care performed:  ? ?Procedures ? ? ?MEDICATIONS ORDERED IN ED: ?Medications  ?morphine (PF) 4 MG/ML injection 4 mg (4 mg Intravenous Given 10/19/21 0954)  ?sodium chloride 0.9 % bolus 1,000 mL (0 mLs Intravenous Stopped 10/19/21 1057)  ?ondansetron [facility]) injection 4 mg (4 mg Intravenous Given 10/19/21 0954)  ?alum & mag hydroxide-simeth (MAALOX/MYLANTA) 200-200-20 MG/5ML suspension 30 mL (30 mLs Oral Given 10/19/21 1056)  ?  And  ?lidocaine (XYLOCAINE) 2 % viscous mouth solution 15 mL (15 mLs Oral Given 10/19/21 1056)  ?oxyCODONE-acetaminophen (PERCOCET/ROXICET) 5-325 MG per tablet 1 tablet (1 tablet Oral Given 10/19/21 1111)  ? ? ? ?IMPRESSION / MDM / ASSESSMENT AND  PLAN / ED COURSE  ?I reviewed the triage vital signs and the nursing notes. ?             ?               ? ?Differential diagnosis includes, but is not limited to, pancreatitis enteritis, gastritis, SBO, perforation, diverticulitis, UTI, sepsis, withdrawal ? ?Patient presenting to the ER for evaluation of epigastric pain feels consistent with pancreatitis after drinking alcohol over the weekend.  Will check blood work.  On review of her past medical record she has had multiple CT images over the past few months.  Based on her lack of guarding rebound tenderness with similar pain do not feel that CT imaging necessarily indicated.  We will check blood work we will give IV fluid as well as pain medication. ? ? ?Clinical Course as of 10/19/21 1140  ?Wed Oct 19, 2021  ?1049 Patient with no leukocytosis.  Hemoglobin normal.  LFTs  normal.  Lipase normal.  Feeling improved after fluids and IV medication.  Also has history of gastritis on Protonix.  Will give GI cocktail.  Given her reassuring work-up thus far normal vital signs no white count do not feel that repeat CT imaging clinically indicated at this time given location of pain. [PR]  ?1137 Patient reassessed.  Feels improved.  Review of PMP patient is getting multiple prescriptions for narcotics which I discussed with her.  Does clearly have chronic pain issue.  She has an appointment with pain clinic on the 12th.  Will give short course prescription for pain medication I do not feel that she requires hospitalization.  We discussed signs and symptoms for which she should return to the ER. [PR]  ?  ?Clinical Course User Index ?[PR] Merlyn Lot, MD  ? ? ? ?FINAL CLINICAL IMPRESSION(S) / ED DIAGNOSES  ? ?Final diagnoses:  ?Epigastric pain  ? ? ? ?Rx / DC Orders  ? ?ED Discharge Orders   ? ?      Ordered  ?  oxyCODONE-acetaminophen (PERCOCET) 5-325 MG tablet  Every 4 hours PRN       ? 10/19/21 1140  ? ?  ?  ? ?  ? ? ? ?Note:  This document was prepared using Dragon voice recognition software and may include unintentional dictation errors. ? ?  ?Merlyn Lot, MD ?10/19/21 1140 ? ?

## 2021-10-19 NOTE — Discharge Instructions (Signed)

## 2021-10-19 NOTE — ED Notes (Signed)
Pt reports drinking alcohol, eating spicy foods, and eating fatty/acidic foods sets off her pancreatitis. Pt endorses doing all of these on Sunday and reports pain started shortly after.  ?

## 2021-10-19 NOTE — ED Triage Notes (Signed)
Pt comes into the ED via POV c/o left abd pain with nausea that started yesterday.  Pt admits she has chronic pancreatitis.  Pt ambulatory at this time and in NAD.  ?

## 2021-10-30 ENCOUNTER — Emergency Department
Admission: EM | Admit: 2021-10-30 | Discharge: 2021-10-30 | Disposition: A | Discharge status: Home / Self Care | Payer: Medicaid Other | Attending: Emergency Medicine | Admitting: Emergency Medicine

## 2021-10-30 ENCOUNTER — Emergency Department: Payer: Medicaid Other

## 2021-10-30 ENCOUNTER — Encounter: Payer: Self-pay | Admitting: Emergency Medicine

## 2021-10-30 ENCOUNTER — Other Ambulatory Visit: Payer: Self-pay

## 2021-10-30 DIAGNOSIS — R1013 Epigastric pain: Secondary | ICD-10-CM | POA: Insufficient documentation

## 2021-10-30 DIAGNOSIS — R780 Finding of alcohol in blood: Secondary | ICD-10-CM | POA: Insufficient documentation

## 2021-10-30 DIAGNOSIS — R1084 Generalized abdominal pain: Secondary | ICD-10-CM

## 2021-10-30 DIAGNOSIS — Y907 Blood alcohol level of 200-239 mg/100 ml: Secondary | ICD-10-CM | POA: Diagnosis not present

## 2021-10-30 DIAGNOSIS — R112 Nausea with vomiting, unspecified: Secondary | ICD-10-CM | POA: Insufficient documentation

## 2021-10-30 DIAGNOSIS — R1012 Left upper quadrant pain: Secondary | ICD-10-CM | POA: Diagnosis present

## 2021-10-30 LAB — HCG, QUANTITATIVE, PREGNANCY: hCG, Beta Chain, Quant, S: 1 m[IU]/mL (ref <5)

## 2021-10-30 LAB — COMPREHENSIVE METABOLIC PANEL
ALT: 9 U/L (ref 0–44)
AST: 14 U/L — ABNORMAL LOW (ref 15–41)
Albumin: 3.6 g/dL (ref 3.5–5.0)
Alkaline Phosphatase: 54 U/L (ref 38–126)
Anion gap: 6 (ref 5–15)
BUN: 11 mg/dL (ref 6–20)
CO2: 24 mmol/L (ref 22–32)
Calcium: 8.5 mg/dL — ABNORMAL LOW (ref 8.9–10.3)
Chloride: 111 mmol/L (ref 98–111)
Creatinine, Ser: 0.71 mg/dL (ref 0.44–1.00)
GFR, Estimated: >60 mL/min (ref >60)
Glucose, Bld: 90 mg/dL (ref 70–99)
Potassium: 3.4 mmol/L — ABNORMAL LOW (ref 3.5–5.1)
Sodium: 141 mmol/L (ref 135–145)
Total Bilirubin: 0.4 mg/dL (ref 0.3–1.2)
Total Protein: 6.9 g/dL (ref 6.5–8.1)

## 2021-10-30 LAB — ETHANOL
Alcohol, Ethyl (B): 202 mg/dL — ABNORMAL HIGH (ref <10)
Alcohol, Ethyl (B): 125 mg/dL — ABNORMAL HIGH (ref <10)

## 2021-10-30 LAB — CBC WITH DIFFERENTIAL/PLATELET
Abs Immature Granulocytes: 0.01 10*3/uL (ref 0.00–0.07)
Basophils Absolute: 0 10*3/uL (ref 0.0–0.1)
Basophils Relative: 1 %
Eosinophils Absolute: 0.2 10*3/uL (ref 0.0–0.5)
Eosinophils Relative: 6 %
HCT: 39.5 % (ref 36.0–46.0)
Hemoglobin: 12.9 g/dL (ref 12.0–15.0)
Immature Granulocytes: 0 %
Lymphocytes Relative: 47 %
Lymphs Abs: 1.9 10*3/uL (ref 0.7–4.0)
MCH: 30.7 pg (ref 26.0–34.0)
MCHC: 32.7 g/dL (ref 30.0–36.0)
MCV: 94 fL (ref 80.0–100.0)
Monocytes Absolute: 0.3 10*3/uL (ref 0.1–1.0)
Monocytes Relative: 8 %
Neutro Abs: 1.5 10*3/uL — ABNORMAL LOW (ref 1.7–7.7)
Neutrophils Relative %: 38 %
Platelets: 300 10*3/uL (ref 150–400)
RBC: 4.2 MIL/uL (ref 3.87–5.11)
RDW: 12.4 % (ref 11.5–15.5)
WBC: 4 10*3/uL (ref 4.0–10.5)
nRBC: 0 % (ref 0.0–0.2)

## 2021-10-30 LAB — LIPASE, BLOOD: Lipase: 25 U/L (ref 11–51)

## 2021-10-30 MED ORDER — MORPHINE SULFATE (PF) 4 MG/ML IV SOLN
4.0000 mg | Freq: Once | INTRAVENOUS | Status: AC
  Administered 2021-10-30: 4 mg via INTRAVENOUS
  Filled 2021-10-30: qty 1

## 2021-10-30 MED ORDER — IOHEXOL 300 MG/ML  SOLN
100.0000 mL | Freq: Once | INTRAMUSCULAR | Status: AC | PRN
  Administered 2021-10-30: 100 mL via INTRAVENOUS

## 2021-10-30 MED ORDER — HYDROCODONE-ACETAMINOPHEN 5-325 MG PO TABS: 1.0000 | ORAL_TABLET | Freq: Four times a day (QID) | ORAL | 0 refills | Status: DC | PRN

## 2021-10-30 MED ORDER — ONDANSETRON HCL 4 MG/2ML IJ SOLN
4.0000 mg | Freq: Once | INTRAMUSCULAR | Status: AC
  Administered 2021-10-30: 4 mg via INTRAVENOUS
  Filled 2021-10-30: qty 2

## 2021-10-30 NOTE — ED Notes (Signed)
Pt presents to ED with c/o of "pancreatitis". Pt states she has had N/V since 0300. Pt denies fevers or chills. Pt denies any recent drinking but appears to be seen in this ED frequently for same issue. Pt was asked how she was sure it was pancreatitis and pt states "its doesn't take much for a flare up". Pt is NAD at this time. Pt denies any urinary symptoms at this time.  ?

## 2021-10-30 NOTE — ED Triage Notes (Signed)
Pt via POV from home. Pt c/o RUQ abd pain and vomiting. States the vomiting started this AM around 0300. Pt has a hx of pancreatitis. Pt is A&Ox4 and NAD ?

## 2021-10-30 NOTE — ED Notes (Signed)
Pt given ginger ale.

## 2021-10-30 NOTE — ED Notes (Signed)
Lab at bedside

## 2021-10-30 NOTE — Discharge Instructions (Addendum)
Please return for worse pain fever or vomiting.  For the pain you are having now you can try Vicodin 1 pill 4 times a day.  I have only given you a small number of these.  Do not drink alcohol with them.  They can result in an overdose and make you stop breathing.  Do not drink anymore.  If you need help getting off of alcohol please let us know or follow-up with [facility] or [facility]. ?

## 2021-10-30 NOTE — ED Notes (Signed)
Called lab to see when the lab will be collected  ? ?

## 2021-10-30 NOTE — ED Notes (Signed)
Lab called and notified them pt's specimens were sent on pt's white label due to label printer leaving full MRN and name off label, there was also a difficult collection sticker to notify of hard stick.  ?

## 2021-10-30 NOTE — ED Notes (Signed)
Lab called to ask about why the specimens that were called about were not running, lab states they "never received them" and I was asked to check he tube station, which this RN did and there was no blood found but the difficult collection sticker was found in tube station, lab notified of this.  ? ?Lab requested to come draw labs due to difficult access.  ?

## 2021-10-30 NOTE — ED Notes (Signed)
Called lab for repeat ethanol.  ?

## 2021-10-30 NOTE — ED Notes (Signed)
Pt refusing to keep monitor on  ? ?

## 2021-10-30 NOTE — ED Provider Notes (Signed)
? ?[facility] ?Provider Note ? ? ? Event Date/Time  ? First MD Initiated Contact with Patient 10/30/21 0940   ?  (approximate) ? ? ?History  ? ?Abdominal Pain ? ? ?HPI ?Sarah Sosa is a 41 y.o. female who reports chronic pancreatitis with a flareup and pain in the upper abdomen especially more on the left side starting yesterday.  She has had nausea and vomiting since 3 this morning.  Patient denies recent drinking but nurse reports she smells of alcohol. ? ?  ? ? ?Physical Exam  ? ?Triage Vital Signs: ?ED Triage Vitals  ?Enc Vitals Group  ?   BP 10/30/21 0934 (!) 179/130  ?   Pulse Rate 10/30/21 0934 91  ?   Resp 10/30/21 0934 18  ?   Temp 10/30/21 0934 97.7 ?F (36.5 ?C)  ?   Temp Source 10/30/21 0934 Oral  ?   SpO2 10/30/21 0934 97 %  ?   Weight 10/30/21 0932 226 lb (102.5 kg)  ?   Height 10/30/21 0932 '5\' 7"'$  (1.702 m)  ?   Head Circumference --   ?   Peak Flow --   ?   Pain Score 10/30/21 0931 10  ?   Pain Loc --   ?   Pain Edu? --   ?   Excl. in [place]? --   ? ? ?Most recent vital signs: ?Vitals:  ? 10/30/21 1300 10/30/21 1330  ?BP: (!) 153/101 (!) 144/105  ?Pulse:  74  ?Resp:  19  ?Temp:  98 ?F (36.7 ?C)  ?SpO2:  94%  ? ? ? ?General: Awake, looks uncomfortable ?CV:  Good peripheral perfusion.  Heart regular rate and rhythm no audible murmurs ?Resp:  Normal effort.  Lungs are clear ?Abd:  No distention.  Soft tender in the left upper quadrant and epigastrium otherwise nontender.  Palpation reproduces her pain. ? ? ? ?ED Results / Procedures / Treatments  ? ?Labs ?(all labs ordered are listed, but only abnormal results are displayed) ?Labs Reviewed  ?CBC WITH DIFFERENTIAL/PLATELET - Abnormal; Notable for the following components:  ?    Result Value  ? Neutro Abs 1.5 (*)   ? All other components within normal limits  ?ETHANOL - Abnormal; Notable for the following components:  ? Alcohol, Ethyl (B) 202 (*)   ? All other components within normal limits  ?COMPREHENSIVE METABOLIC PANEL - Abnormal;  Notable for the following components:  ? Potassium 3.4 (*)   ? Calcium 8.5 (*)   ? AST 14 (*)   ? All other components within normal limits  ?HCG, QUANTITATIVE, PREGNANCY  ?LIPASE, BLOOD  ?PREGNANCY, URINE  ?ETHANOL  ? ? ? ?EKG ? ? ? ? ?RADIOLOGY ?CT read by radiology reviewed by me does not show any acute pathology.  She does have worsening cardiomegaly and pancreatic atrophy but no sign of pancreatitis ? ? ?PROCEDURES: ? ?Critical Care performed:  ? ?Procedures ? ? ?MEDICATIONS ORDERED IN ED: ?Medications  ?morphine (PF) 4 MG/ML injection 4 mg (4 mg Intravenous Given 10/30/21 1024)  ?ondansetron (ZOFRAN) injection 4 mg (4 mg Intravenous Given 10/30/21 1024)  ?morphine (PF) 4 MG/ML injection 4 mg (4 mg Intravenous Given 10/30/21 1205)  ?iohexol (OMNIPAQUE) 300 MG/ML solution 100 mL (100 mLs Intravenous Contrast Given 10/30/21 1211)  ? ? ? ?IMPRESSION / MDM / ASSESSMENT AND PLAN / ED COURSE  ?I reviewed the triage vital signs and the nursing notes. ?Patient had wanted more pain medicine.  I am  not going to give her any at this point.  The alcohol level has come back at 202.  We will have to watch her until such time as her alcohol level is below 80 and her morphine has worn off before she can leave and try to drive.  I should add that her CBC is okay as is her electrolytes and liver panel. ?Patient may be having some pain from chronic pancreatitis where she may be having some drug-seeking behavior but again the pancreas does not look inflamed on the CT and the lipase is normal ? ? ? ? ?FINAL CLINICAL IMPRESSION(S) / ED DIAGNOSES  ? ?Final diagnoses:  ?Generalized abdominal pain  ? ? ? ?Rx / DC Orders  ? ?ED Discharge Orders   ? ? None  ? ?  ? ? ? ?Note:  This document was prepared using Dragon voice recognition software and may include unintentional dictation errors. ?  ?Nena Polio, MD ?10/30/21 1537 ? ?

## 2021-11-20 ENCOUNTER — Other Ambulatory Visit: Payer: Self-pay

## 2021-11-20 ENCOUNTER — Inpatient Hospital Stay
Admission: EM | Admit: 2021-11-20 | Discharge: 2021-11-27 | DRG: 439 | Disposition: A | Discharge status: Home / Self Care | Payer: Medicaid Other | Attending: Internal Medicine | Admitting: Internal Medicine

## 2021-11-20 ENCOUNTER — Emergency Department: Payer: Medicaid Other

## 2021-11-20 ENCOUNTER — Encounter: Payer: Self-pay | Admitting: Emergency Medicine

## 2021-11-20 DIAGNOSIS — Z91040 Latex allergy status: Secondary | ICD-10-CM

## 2021-11-20 DIAGNOSIS — G8929 Other chronic pain: Secondary | ICD-10-CM | POA: Diagnosis present

## 2021-11-20 DIAGNOSIS — Z888 Allergy status to other drugs, medicaments and biological substances status: Secondary | ICD-10-CM | POA: Diagnosis not present

## 2021-11-20 DIAGNOSIS — F1010 Alcohol abuse, uncomplicated: Secondary | ICD-10-CM | POA: Diagnosis present

## 2021-11-20 DIAGNOSIS — I10 Essential (primary) hypertension: Secondary | ICD-10-CM | POA: Diagnosis not present

## 2021-11-20 DIAGNOSIS — G6289 Other specified polyneuropathies: Secondary | ICD-10-CM | POA: Diagnosis not present

## 2021-11-20 DIAGNOSIS — Z8261 Family history of arthritis: Secondary | ICD-10-CM

## 2021-11-20 DIAGNOSIS — Z8249 Family history of ischemic heart disease and other diseases of the circulatory system: Secondary | ICD-10-CM | POA: Diagnosis not present

## 2021-11-20 DIAGNOSIS — I110 Hypertensive heart disease with heart failure: Secondary | ICD-10-CM | POA: Diagnosis present

## 2021-11-20 DIAGNOSIS — M1990 Unspecified osteoarthritis, unspecified site: Secondary | ICD-10-CM | POA: Diagnosis present

## 2021-11-20 DIAGNOSIS — R109 Unspecified abdominal pain: Principal | ICD-10-CM

## 2021-11-20 DIAGNOSIS — R002 Palpitations: Secondary | ICD-10-CM | POA: Diagnosis not present

## 2021-11-20 DIAGNOSIS — Z87892 Personal history of anaphylaxis: Secondary | ICD-10-CM

## 2021-11-20 DIAGNOSIS — I5030 Unspecified diastolic (congestive) heart failure: Secondary | ICD-10-CM

## 2021-11-20 DIAGNOSIS — Z90710 Acquired absence of both cervix and uterus: Secondary | ICD-10-CM

## 2021-11-20 DIAGNOSIS — K859 Acute pancreatitis without necrosis or infection, unspecified: Secondary | ICD-10-CM | POA: Insufficient documentation

## 2021-11-20 DIAGNOSIS — Z79899 Other long term (current) drug therapy: Secondary | ICD-10-CM | POA: Diagnosis not present

## 2021-11-20 DIAGNOSIS — Z91018 Allergy to other foods: Secondary | ICD-10-CM | POA: Diagnosis not present

## 2021-11-20 DIAGNOSIS — I5032 Chronic diastolic (congestive) heart failure: Secondary | ICD-10-CM | POA: Diagnosis present

## 2021-11-20 DIAGNOSIS — J45909 Unspecified asthma, uncomplicated: Secondary | ICD-10-CM | POA: Diagnosis present

## 2021-11-20 DIAGNOSIS — E876 Hypokalemia: Secondary | ICD-10-CM | POA: Diagnosis present

## 2021-11-20 DIAGNOSIS — J4520 Mild intermittent asthma, uncomplicated: Secondary | ICD-10-CM | POA: Diagnosis not present

## 2021-11-20 DIAGNOSIS — I739 Peripheral vascular disease, unspecified: Secondary | ICD-10-CM | POA: Diagnosis present

## 2021-11-20 DIAGNOSIS — F17210 Nicotine dependence, cigarettes, uncomplicated: Secondary | ICD-10-CM | POA: Diagnosis present

## 2021-11-20 DIAGNOSIS — G629 Polyneuropathy, unspecified: Secondary | ICD-10-CM | POA: Diagnosis present

## 2021-11-20 DIAGNOSIS — Z885 Allergy status to narcotic agent status: Secondary | ICD-10-CM | POA: Diagnosis not present

## 2021-11-20 DIAGNOSIS — K219 Gastro-esophageal reflux disease without esophagitis: Secondary | ICD-10-CM

## 2021-11-20 DIAGNOSIS — K852 Alcohol induced acute pancreatitis without necrosis or infection: Secondary | ICD-10-CM

## 2021-11-20 LAB — COMPREHENSIVE METABOLIC PANEL
ALT: 17 U/L (ref 0–44)
AST: 30 U/L (ref 15–41)
Albumin: 4.2 g/dL (ref 3.5–5.0)
Alkaline Phosphatase: 66 U/L (ref 38–126)
Anion gap: 13 (ref 5–15)
BUN: 10 mg/dL (ref 6–20)
CO2: 21 mmol/L — ABNORMAL LOW (ref 22–32)
Calcium: 9.2 mg/dL (ref 8.9–10.3)
Chloride: 104 mmol/L (ref 98–111)
Creatinine, Ser: 0.75 mg/dL (ref 0.44–1.00)
GFR, Estimated: >60 mL/min (ref >60)
Glucose, Bld: 113 mg/dL — ABNORMAL HIGH (ref 70–99)
Potassium: 3.5 mmol/L (ref 3.5–5.1)
Sodium: 138 mmol/L (ref 135–145)
Total Bilirubin: 0.6 mg/dL (ref 0.3–1.2)
Total Protein: 8.2 g/dL — ABNORMAL HIGH (ref 6.5–8.1)

## 2021-11-20 LAB — CBC
HCT: 45.1 % (ref 36.0–46.0)
Hemoglobin: 14.9 g/dL (ref 12.0–15.0)
MCH: 30.2 pg (ref 26.0–34.0)
MCHC: 33 g/dL (ref 30.0–36.0)
MCV: 91.3 fL (ref 80.0–100.0)
Platelets: 312 10*3/uL (ref 150–400)
RBC: 4.94 MIL/uL (ref 3.87–5.11)
RDW: 12.8 % (ref 11.5–15.5)
WBC: 6.5 10*3/uL (ref 4.0–10.5)
nRBC: 0 % (ref 0.0–0.2)

## 2021-11-20 LAB — LIPASE, BLOOD: Lipase: 188 U/L — ABNORMAL HIGH (ref 11–51)

## 2021-11-20 MED ORDER — LISINOPRIL 10 MG PO TABS: 20.0000 mg | ORAL_TABLET | Freq: Every day | ORAL | Status: DC

## 2021-11-20 MED ORDER — PREGABALIN 75 MG PO CAPS
75.0000 mg | ORAL_CAPSULE | Freq: Two times a day (BID) | ORAL | Status: DC
Start: 2021-11-20 — End: 2021-11-27
  Administered 2021-11-20 – 2021-11-27 (×14): 75 mg via ORAL
  Filled 2021-11-20 (×14): qty 1

## 2021-11-20 MED ORDER — AMLODIPINE BESYLATE 10 MG PO TABS
10.0000 mg | ORAL_TABLET | Freq: Every day | ORAL | Status: DC
  Administered 2021-11-20 – 2021-11-27 (×8): 10 mg via ORAL
  Filled 2021-11-20 (×3): qty 1
  Filled 2021-11-20: qty 2
  Filled 2021-11-20 (×4): qty 1

## 2021-11-20 MED ORDER — SODIUM CHLORIDE 0.9 % IV SOLN: INTRAVENOUS | Status: DC

## 2021-11-20 MED ORDER — PANTOPRAZOLE SODIUM 40 MG PO TBEC
40.0000 mg | DELAYED_RELEASE_TABLET | Freq: Every day | ORAL | Status: DC
  Administered 2021-11-20 – 2021-11-27 (×8): 40 mg via ORAL
  Filled 2021-11-20 (×8): qty 1

## 2021-11-20 MED ORDER — ADULT MULTIVITAMIN W/MINERALS CH: 1.0000 | ORAL_TABLET | Freq: Every day | ORAL | Status: DC

## 2021-11-20 MED ORDER — ONDANSETRON HCL 4 MG/2ML IJ SOLN
4.0000 mg | Freq: Once | INTRAMUSCULAR | Status: AC
  Administered 2021-11-20: 4 mg via INTRAVENOUS
  Filled 2021-11-20: qty 2

## 2021-11-20 MED ORDER — LORAZEPAM 2 MG/ML IJ SOLN: 1.0000 mg | INTRAMUSCULAR | Status: DC | PRN

## 2021-11-20 MED ORDER — HYDROMORPHONE HCL 1 MG/ML IJ SOLN
1.0000 mg | INTRAMUSCULAR | Status: DC | PRN
  Administered 2021-11-21 – 2021-11-22 (×11): 1 mg via INTRAVENOUS
  Filled 2021-11-20 (×11): qty 1

## 2021-11-20 MED ORDER — ENOXAPARIN SODIUM 60 MG/0.6ML IJ SOSY
0.5000 mg/kg | PREFILLED_SYRINGE | INTRAMUSCULAR | Status: DC
  Administered 2021-11-20 – 2021-11-26 (×7): 52.5 mg via SUBCUTANEOUS
  Filled 2021-11-20 (×7): qty 0.6

## 2021-11-20 MED ORDER — IRBESARTAN 150 MG PO TABS
300.0000 mg | ORAL_TABLET | Freq: Every day | ORAL | Status: DC
  Administered 2021-11-21 – 2021-11-27 (×7): 300 mg via ORAL
  Filled 2021-11-20 (×7): qty 2

## 2021-11-20 MED ORDER — SODIUM CHLORIDE 0.9 % IV BOLUS
1000.0000 mL | Freq: Once | INTRAVENOUS | Status: AC
  Administered 2021-11-20: 1000 mL via INTRAVENOUS

## 2021-11-20 MED ORDER — ONDANSETRON HCL 4 MG/2ML IJ SOLN
4.0000 mg | Freq: Four times a day (QID) | INTRAMUSCULAR | Status: DC | PRN
Start: 2021-11-20 — End: 2021-11-27
  Administered 2021-11-21 – 2021-11-26 (×13): 4 mg via INTRAVENOUS
  Filled 2021-11-20 (×13): qty 2

## 2021-11-20 MED ORDER — SUCRALFATE 1 G PO TABS
1.0000 g | ORAL_TABLET | Freq: Four times a day (QID) | ORAL | Status: DC
  Administered 2021-11-20 – 2021-11-27 (×23): 1 g via ORAL
  Filled 2021-11-20 (×23): qty 1

## 2021-11-20 MED ORDER — ACETAMINOPHEN 650 MG RE SUPP: 650.0000 mg | Freq: Four times a day (QID) | RECTAL | Status: DC | PRN

## 2021-11-20 MED ORDER — MORPHINE SULFATE (PF) 4 MG/ML IV SOLN
4.0000 mg | Freq: Once | INTRAVENOUS | Status: AC
  Administered 2021-11-20: 4 mg via INTRAVENOUS
  Filled 2021-11-20: qty 1

## 2021-11-20 MED ORDER — TRAZODONE HCL 50 MG PO TABS: 25.0000 mg | ORAL_TABLET | Freq: Every evening | ORAL | Status: DC | PRN

## 2021-11-20 MED ORDER — CARVEDILOL 25 MG PO TABS
25.0000 mg | ORAL_TABLET | Freq: Two times a day (BID) | ORAL | Status: DC
  Administered 2021-11-21 – 2021-11-27 (×13): 25 mg via ORAL
  Filled 2021-11-20 (×13): qty 1

## 2021-11-20 MED ORDER — MAGNESIUM HYDROXIDE 400 MG/5ML PO SUSP: 30.0000 mL | Freq: Every day | ORAL | Status: DC | PRN

## 2021-11-20 MED ORDER — THIAMINE HCL 100 MG PO TABS: 100.0000 mg | ORAL_TABLET | Freq: Every day | ORAL | Status: DC

## 2021-11-20 MED ORDER — THIAMINE HCL 100 MG/ML IJ SOLN: 100.0000 mg | Freq: Every day | INTRAMUSCULAR | Status: DC

## 2021-11-20 MED ORDER — ONDANSETRON 4 MG PO TBDP
4.0000 mg | ORAL_TABLET | Freq: Once | ORAL | Status: AC
  Administered 2021-11-20: 4 mg via ORAL
  Filled 2021-11-20: qty 1

## 2021-11-20 MED ORDER — SPIRONOLACTONE 25 MG PO TABS
50.0000 mg | ORAL_TABLET | Freq: Every day | ORAL | Status: DC
  Administered 2021-11-20 – 2021-11-22 (×3): 50 mg via ORAL
  Filled 2021-11-20 (×3): qty 2

## 2021-11-20 MED ORDER — FOLIC ACID 1 MG PO TABS
1.0000 mg | ORAL_TABLET | Freq: Every day | ORAL | Status: DC
  Administered 2021-11-21 – 2021-11-27 (×7): 1 mg via ORAL
  Filled 2021-11-20 (×7): qty 1

## 2021-11-20 MED ORDER — LORAZEPAM 2 MG/ML IJ SOLN: 1.0000 mg | INTRAMUSCULAR | Status: AC | PRN

## 2021-11-20 MED ORDER — ONDANSETRON HCL 4 MG PO TABS
4.0000 mg | ORAL_TABLET | Freq: Four times a day (QID) | ORAL | Status: DC | PRN
  Administered 2021-11-26: 4 mg via ORAL
  Filled 2021-11-20: qty 1

## 2021-11-20 MED ORDER — LORAZEPAM 1 MG PO TABS
1.0000 mg | ORAL_TABLET | ORAL | Status: AC | PRN
  Administered 2021-11-23: 1 mg via ORAL
  Filled 2021-11-20: qty 1

## 2021-11-20 MED ORDER — HYDROMORPHONE HCL 1 MG/ML IJ SOLN
1.0000 mg | Freq: Once | INTRAMUSCULAR | Status: AC
  Administered 2021-11-20: 1 mg via INTRAVENOUS
  Filled 2021-11-20: qty 1

## 2021-11-20 MED ORDER — ADULT MULTIVITAMIN W/MINERALS CH
1.0000 | ORAL_TABLET | Freq: Every day | ORAL | Status: DC
  Administered 2021-11-21 – 2021-11-27 (×7): 1 via ORAL
  Filled 2021-11-20 (×7): qty 1

## 2021-11-20 MED ORDER — ACETAMINOPHEN 325 MG PO TABS
650.0000 mg | ORAL_TABLET | Freq: Four times a day (QID) | ORAL | Status: DC | PRN
  Administered 2021-11-21 – 2021-11-27 (×17): 650 mg via ORAL
  Filled 2021-11-20 (×18): qty 2

## 2021-11-20 MED ORDER — IOHEXOL 300 MG/ML  SOLN
100.0000 mL | Freq: Once | INTRAMUSCULAR | Status: AC | PRN
  Administered 2021-11-20: 100 mL via INTRAVENOUS

## 2021-11-20 MED ORDER — THIAMINE HCL 100 MG PO TABS
100.0000 mg | ORAL_TABLET | Freq: Every day | ORAL | Status: DC
  Administered 2021-11-21 – 2021-11-27 (×7): 100 mg via ORAL
  Filled 2021-11-20 (×7): qty 1

## 2021-11-20 MED ORDER — TRAZODONE HCL 50 MG PO TABS
50.0000 mg | ORAL_TABLET | Freq: Every day | ORAL | Status: DC
Start: 2021-11-20 — End: 2021-11-27
  Administered 2021-11-20 – 2021-11-26 (×7): 50 mg via ORAL
  Filled 2021-11-20 (×7): qty 1

## 2021-11-20 MED ORDER — CHLORHEXIDINE GLUCONATE 0.12 % MT SOLN
15.0000 mL | Freq: Two times a day (BID) | OROMUCOSAL | Status: DC
  Administered 2021-11-21 – 2021-11-26 (×8): 15 mL via OROMUCOSAL
  Filled 2021-11-20 (×11): qty 15

## 2021-11-20 MED ORDER — KETOROLAC TROMETHAMINE 15 MG/ML IJ SOLN
15.0000 mg | Freq: Four times a day (QID) | INTRAMUSCULAR | Status: DC | PRN
  Administered 2021-11-20 – 2021-11-23 (×4): 15 mg via INTRAVENOUS
  Filled 2021-11-20 (×4): qty 1

## 2021-11-20 MED ORDER — MORPHINE SULFATE (PF) 2 MG/ML IV SOLN
2.0000 mg | INTRAVENOUS | Status: DC | PRN
  Administered 2021-11-20: 2 mg via INTRAVENOUS
  Filled 2021-11-20: qty 1

## 2021-11-20 MED ORDER — FOLIC ACID 1 MG PO TABS: 1.0000 mg | ORAL_TABLET | Freq: Every day | ORAL | Status: DC

## 2021-11-20 NOTE — ED Notes (Signed)
Pt begins having red, thin, watery emesis in lobby.  ?

## 2021-11-20 NOTE — Assessment & Plan Note (Addendum)
Continue Singulair, ProAir and Symbicort. ?

## 2021-11-20 NOTE — Assessment & Plan Note (Addendum)
Continue PPI therapy and Carafate. ?

## 2021-11-20 NOTE — Assessment & Plan Note (Addendum)
Continue Coreg and Diovan as outpatient.  Hold off on spironolactone at this point.  Lasix as needed.  No signs of heart failure during the hospital stay despite giving quite a bit of IV fluids ?

## 2021-11-20 NOTE — Assessment & Plan Note (Addendum)
Continue Lyrica. 

## 2021-11-20 NOTE — Assessment & Plan Note (Addendum)
No signs of withdrawal.  Continue thiamine folic acid and multivitamin.  Patient must stop drinking or else will end up in the hospital more often. ?

## 2021-11-20 NOTE — Assessment & Plan Note (Addendum)
Continue amlodipine, Coreg and can go back on Diovan as outpatient ?

## 2021-11-20 NOTE — H&P (Addendum)
?  ?  ?[place] ? ? ?PATIENT NAME: Sarah Sosa   ? ?MR#:  [id number] ? ?DATE OF BIRTH:  1980/11/02 ? ?DATE OF ADMISSION:  11/20/2021 ? ?PRIMARY CARE PHYSICIAN: [facility]  ? ?Patient is coming from: Home ? ?REQUESTING/REFERRING PHYSICIAN: Lavonia Drafts, MD ? ?CHIEF COMPLAINT:  ? ?Chief Complaint  ?Patient presents with  ? Abdominal Pain  ? ? ?HISTORY OF PRESENT ILLNESS:  ?Sarah Sosa is a 41 y.o. African-American female with medical history significant for asthma, GERD, chronic pain, hypertension and pancreatitis, who presented to the emergency room with acute onset of epigastric and left upper quadrant abdominal pain with associated nausea and vomiting since this morning.  She admits to low-grade fever of 99 as well as occasional watery diarrhea.  She denies any chills.  No dysuria, oliguria or hematuria or flank pain.  No chest pain or dyspnea or cough.  No headaches or dizziness or blurred vision.  The patient is status post cholecystectomy.  She admits to drinking 6-7 shots of liquor last night.  She stated though that she is a social drinker. ? ?ED Course: Upon presenting to the emergency room BP was 229 with 213 mL 200/102 with otherwise normal vital signs.  Labs revealed serum lipase of 188 and troponin of 8.2 with otherwise unremarkable CMP.  CBC was within normal. ? ?Imaging: Abdominal and pelvic CT scan revealed moderate to marked severity acute pancreatitis. ? ?The patient was given 4 mg of IV morphine sulfate twice and 4 mg of IV Zofran as well as 1 mg of IV Dilaudid and 1 L bolus of IV normal saline.  She will be admitted to a medical bed for further evaluation and management. ?PAST MEDICAL HISTORY:  ? ?Past Medical History:  ?Diagnosis Date  ? Arthritis   ? Asthma   ? Chronic pain disorder 03/19/2018  ? GERD (gastroesophageal reflux disease)   ? Headache   ? Heart murmur   ? History of trichomoniasis 03/19/2018  ? Hypertension   ? Pancreatitis   ? PID (pelvic inflammatory disease)  02/14/2018  ? Uterine leiomyoma 03/19/2018  ? ? ?PAST SURGICAL HISTORY:  ? ?Past Surgical History:  ?Procedure Laterality Date  ? ABDOMINAL HYSTERECTOMY    ? CHOLECYSTECTOMY    ? DENTAL SURGERY    ? HYSTERECTOMY ABDOMINAL WITH SALPINGECTOMY Bilateral 04/15/2018  ? Procedure: HYSTERECTOMY ABDOMINAL WITH BILATERAL SALPINGECTOMY;  Surgeon: Rubie Maid, MD;  Location: [facility] ORS;  Service: Gynecology;  Laterality: Bilateral;  ? kenn surgery Bilateral   ? KNEE ARTHROSCOPY Left 2012, 2013  ? ? ?SOCIAL HISTORY:  ? ?Social History  ? ?Tobacco Use  ? Smoking status: Some Days  ?  Packs/day: 0.50  ?  Years: 22.00  ?  Pack years: 11.00  ?  Types: Cigarettes  ? Smokeless tobacco: Never  ?Substance Use Topics  ? Alcohol use: Yes  ?  Comment: occasional  ? ? ?FAMILY HISTORY:  ? ?Family History  ?Problem Relation Age of Onset  ? Fibroids Mother   ? Hypertension Mother   ? Arthritis Mother   ? ? ?DRUG ALLERGIES:  ? ?Allergies  ?Allergen Reactions  ? Latex Anaphylaxis, Hives and Itching  ?  Other reaction(s): Other (See Comments) ?SKIN BURNING & PEELING ?  ? Nsaids Other (See Comments)  ?  Serve ulcers; stomach aches, GERD.   ? Tomato Anaphylaxis, Hives and Itching  ? Tramadol Itching  ? ? ?REVIEW OF SYSTEMS:  ? ?ROS ?As per history of present illness. All pertinent  systems were reviewed above. Constitutional, HEENT, cardiovascular, respiratory, GI, GU, musculoskeletal, neuro, psychiatric, endocrine, integumentary and hematologic systems were reviewed and are otherwise negative/unremarkable except for positive findings mentioned above in the HPI. ? ? ?MEDICATIONS AT HOME:  ? ?Prior to Admission medications   ?Medication Sig Start Date End Date Taking? Authorizing Provider  ?amLODipine (NORVASC) 10 MG tablet Take 1 tablet (10 mg total) by mouth daily. Blood pressure medication. 06/08/20 11/20/21 Yes Enzo Bi, MD  ?carvedilol (COREG) 25 MG tablet Take 1 tablet by mouth in the morning and at bedtime. 09/27/21  Yes [provider]   ?chlorhexidine (PERIDEX) 0.12 % solution Use as directed 15 mLs in the mouth or throat 2 (two) times daily. 11/14/21  Yes [provider]  ?furosemide (LASIX) 40 MG tablet Take 40-80 mg by mouth daily as needed. 09/14/21  Yes [provider]  ?pantoprazole (PROTONIX) 40 MG tablet Take 1 tablet (40 mg total) by mouth daily. 10/08/21 10/08/22 Yes Harvest Dark, MD  ?penicillin v potassium (VEETID) 500 MG tablet Take 500 mg by mouth 4 (four) times daily. 11/14/21  Yes [provider]  ?pregabalin (LYRICA) 75 MG capsule Take 75 mg by mouth 2 (two) times daily. 10/26/21  Yes [provider]  ?spironolactone (ALDACTONE) 50 MG tablet Take 50 mg by mouth daily. 09/30/21  Yes [provider]  ?sucralfate (CARAFATE) 1 g tablet Take 1 tablet (1 g total) by mouth 4 (four) times daily. 10/08/21 10/08/22 Yes Harvest Dark, MD  ?traZODone (DESYREL) 50 MG tablet Take 50 mg by mouth at bedtime. 09/07/21  Yes [provider]  ?valsartan (DIOVAN) 320 MG tablet Take 320 mg by mouth daily. 09/14/21  Yes [provider]  ?DULoxetine (CYMBALTA) 30 MG capsule Take 30 mg by mouth daily. ?Patient not taking: Reported on 10/30/2021 07/07/21   [provider]  ?lisinopril (ZESTRIL) 20 MG tablet Take 1 tablet (20 mg total) by mouth 2 (two) times daily at 10 AM and 5 PM. ?Patient taking differently: Take 20 mg by mouth at bedtime.  04/05/20 06/04/20  Nolberto Hanlon, MD  ?montelukast (SINGULAIR) 10 MG tablet Take 10 mg by mouth daily. ?Patient not taking: Reported on 10/08/2021 03/30/20   [provider]  ?naloxone [facility]) nasal spray 4 mg/0.1 mL daily. ?Patient not taking: Reported on 11/20/2021 10/06/21   [provider]  ?nicotine (NICODERM CQ - DOSED IN MG/24 HOURS) 14 mg/24hr patch 14 mg daily. ?Patient not taking: Reported on 11/20/2021 09/07/21   [provider]  ?oxyCODONE-acetaminophen (PERCOCET) 5-325 MG tablet Take 1 tablet by mouth every 4 (four) hours  as needed for severe pain. ?Patient not taking: Reported on 11/20/2021 10/19/21 10/19/22  Merlyn Lot, MD  ?[facility] HFA 108 (90 Base) MCG/ACT inhaler Inhale 2 puffs into the lungs every 4 (four) hours as needed. ?Patient not taking: Reported on 10/08/2021 03/30/20   [provider]  ?SYMBICORT 160-4.5 MCG/ACT inhaler Inhale 2 puffs into the lungs 2 (two) times daily. ?Patient not taking: Reported on 10/08/2021 03/30/20   [provider]  ? ?  ? ?VITAL SIGNS:  ?Blood pressure (!) 192/92, pulse (!) 102, temperature 99 ?F (37.2 ?C), resp. rate (!) 24, height '5\' 7"'$  (1.702 m), weight 104.8 kg, last menstrual period 03/25/2018, SpO2 99 %. ? ?PHYSICAL EXAMINATION:  ?Physical Exam ? ?GENERAL:  41 y.o.-year-old African-American female patient lying in the bed with no acute distress.  ?EYES: Pupils equal, round, reactive to light and accommodation. No scleral icterus. Extraocular muscles intact.  ?  HEENT: Head atraumatic, normocephalic. Oropharynx and nasopharynx clear.  ?NECK:  Supple, no jugular venous distention. No thyroid enlargement, no tenderness.  ?LUNGS: Normal breath sounds bilaterally, no wheezing, rales,rhonchi or crepitation. No use of accessory muscles of respiration.  ?CARDIOVASCULAR: Regular rate and rhythm, S1, S2 normal. No murmurs, rubs, or gallops.  ?ABDOMEN: Soft, nondistended,  with epigastric abdominal tenderness without rebound tenderness, guarding or rigidity. Bowel sounds present. No organomegaly or mass.  ?EXTREMITIES: No pedal edema, cyanosis, or clubbing.  ?NEUROLOGIC: Cranial nerves II through XII are intact. Muscle strength 5/5 in all extremities. Sensation intact. Gait not checked.  ?PSYCHIATRIC: The patient is alert and oriented x 3.  Normal affect and good eye contact. ?SKIN: No obvious rash, lesion, or ulcer.  ? ?LABORATORY PANEL:  ? ?CBC ?Recent Labs  ?Lab 11/20/21 ?1759  ?WBC 6.5  ?HGB 14.9  ?HCT 45.1  ?PLT 312   ? ?------------------------------------------------------------------------------------------------------------------ ? ?Chemistries  ?Recent Labs  ?Lab 11/20/21 ?1759  ?NA 138  ?K 3.5  ?CL 104  ?CO2 21*  ?GLUCOSE 113*  ?BUN 10  ?CREATININE 0.75  ?CALCIUM 9.2  ?AST 30  ?ALT

## 2021-11-20 NOTE — ED Triage Notes (Signed)
Pt via POV from home. Pt c/o RUQ pain, pt has a hx of pancreatitis and drank yesterday. Pt started having pain this AM. Pt endorses NV. Pt is A&Ox4, pt crying and uncomfortable in triage.  ?

## 2021-11-20 NOTE — Assessment & Plan Note (Addendum)
Patient tolerated diet yesterday and today.  Continue pancreatic enzymes prior to meals.  Small prescription of continue oral pain medications prescribed upon discharge. ?

## 2021-11-20 NOTE — ED Provider Notes (Signed)
? ?[facility] ?Provider Note ? ? ? Event Date/Time  ? First MD Initiated Contact with Patient 11/20/21 1929   ?  (approximate) ? ? ?History  ? ?Abdominal Pain ? ? ?HPI ? ?Sarah Sosa is a 41 y.o. female with history of pancreatitis, hypertension, asthma, arthritis and PAD presents emergency department complaining of epigastric pain.  Patient states that she drank alcohol last night which is aggravated pancreatitis.  States she is having a lot of pain in her abdomen.  Had vomiting.  Normal bowel movement 2 days ago.  No fever or chills.  No chest pain/shortness of breath ? ?  ? ? ?Physical Exam  ? ?Triage Vital Signs: ?ED Triage Vitals  ?Enc Vitals Group  ?   BP 11/20/21 1801 (S) (!) 229/213  ?   Pulse Rate 11/20/21 1801 83  ?   Resp 11/20/21 1801 18  ?   Temp 11/20/21 1801 99 ?F (37.2 ?C)  ?   Temp src --   ?   SpO2 11/20/21 1801 100 %  ?   Weight 11/20/21 1758 231 lb (104.8 kg)  ?   Height 11/20/21 1758 '5\' 7"'$  (1.702 m)  ?   Head Circumference --   ?   Peak Flow --   ?   Pain Score 11/20/21 1757 10  ?   Pain Loc --   ?   Pain Edu? --   ?   Excl. in [place]? --   ? ? ?Most recent vital signs: ?Vitals:  ? 11/20/21 1801  ?BP: (S) (!) 229/213  ?Pulse: 83  ?Resp: 18  ?Temp: 99 ?F (37.2 ?C)  ?SpO2: 100%  ? ? ? ?General: Awake, no distress.   ?CV:  Good peripheral perfusion. regular rate and  rhythm ?Resp:  Normal effort. Lungs CTA ?Abd:  No distention.  Tender in epigastric area, bowel sounds decreased ?Other:    ? ? ?ED Results / Procedures / Treatments  ? ?Labs ?(all labs ordered are listed, but only abnormal results are displayed) ?Labs Reviewed  ?LIPASE, BLOOD - Abnormal; Notable for the following components:  ?    Result Value  ? Lipase 188 (*)   ? All other components within normal limits  ?COMPREHENSIVE METABOLIC PANEL - Abnormal; Notable for the following components:  ? CO2 21 (*)   ? Glucose, Bld 113 (*)   ? Total Protein 8.2 (*)   ? All other components within normal limits  ?CBC   ?URINALYSIS, ROUTINE W REFLEX MICROSCOPIC  ? ? ? ?EKG ? ? ? ? ?RADIOLOGY ?CT abdomen/pelvis IV contrast ? ? ? ?PROCEDURES: ? ? ?Procedures ? ? ?MEDICATIONS ORDERED IN ED: ?Medications  ?sodium chloride 0.9 % bolus 1,000 mL (has no administration in time range)  ?morphine (PF) 4 MG/ML injection 4 mg (has no administration in time range)  ?ondansetron (ZOFRAN-ODT) disintegrating tablet 4 mg (4 mg Oral Given 11/20/21 1856)  ? ? ? ?IMPRESSION / MDM / ASSESSMENT AND PLAN / ED COURSE  ?I reviewed the triage vital signs and the nursing notes. ?             ?               ? ?Differential diagnosis includes, but is not limited to, pancreatitis, bowel obstruction, appendicitis ? ?CBC and metabolic panel are normal, lipase is indicating pancreatitis at 188. urinalysis is pending ? ?CT abdomen/pelvis with IV contrast ordered, morphine 4 mg IV, Zofran 4 mg IV and normal saline 1  L IV ordered. ? ?Care is being transferred to Dr. Corky Downs ? ? ? ? ?  ? ? ?FINAL CLINICAL IMPRESSION(S) / ED DIAGNOSES  ? ?Final diagnoses:  ?Acute abdominal pain  ? ? ? ?Rx / DC Orders  ? ?ED Discharge Orders   ? ? None  ? ?  ? ? ? ?Note:  This document was prepared using Dragon voice recognition software and may include unintentional dictation errors. ? ?  ?Versie Starks, PA-C ?11/20/21 1941 ? ?  ?Lavonia Drafts, MD ?11/20/21 2123 ? ?

## 2021-11-21 ENCOUNTER — Other Ambulatory Visit: Payer: Self-pay

## 2021-11-21 ENCOUNTER — Encounter: Payer: Self-pay | Admitting: Family Medicine

## 2021-11-21 DIAGNOSIS — I10 Essential (primary) hypertension: Secondary | ICD-10-CM | POA: Diagnosis not present

## 2021-11-21 DIAGNOSIS — K852 Alcohol induced acute pancreatitis without necrosis or infection: Secondary | ICD-10-CM | POA: Diagnosis not present

## 2021-11-21 DIAGNOSIS — F1010 Alcohol abuse, uncomplicated: Secondary | ICD-10-CM | POA: Diagnosis not present

## 2021-11-21 LAB — BASIC METABOLIC PANEL
Anion gap: 8 (ref 5–15)
BUN: 8 mg/dL (ref 6–20)
CO2: 26 mmol/L (ref 22–32)
Calcium: 8.4 mg/dL — ABNORMAL LOW (ref 8.9–10.3)
Chloride: 104 mmol/L (ref 98–111)
Creatinine, Ser: 0.75 mg/dL (ref 0.44–1.00)
GFR, Estimated: >60 mL/min (ref >60)
Glucose, Bld: 99 mg/dL (ref 70–99)
Potassium: 3 mmol/L — ABNORMAL LOW (ref 3.5–5.1)
Sodium: 138 mmol/L (ref 135–145)

## 2021-11-21 LAB — ETHANOL: Alcohol, Ethyl (B): <10 mg/dL (ref <10)

## 2021-11-21 LAB — CBC
HCT: 38.1 % (ref 36.0–46.0)
Hemoglobin: 12.7 g/dL (ref 12.0–15.0)
MCH: 30.5 pg (ref 26.0–34.0)
MCHC: 33.3 g/dL (ref 30.0–36.0)
MCV: 91.6 fL (ref 80.0–100.0)
Platelets: 227 10*3/uL (ref 150–400)
RBC: 4.16 MIL/uL (ref 3.87–5.11)
RDW: 12.7 % (ref 11.5–15.5)
WBC: 5.5 10*3/uL (ref 4.0–10.5)
nRBC: 0 % (ref 0.0–0.2)

## 2021-11-21 LAB — URINALYSIS, ROUTINE W REFLEX MICROSCOPIC
Bilirubin Urine: NEGATIVE
Glucose, UA: NEGATIVE mg/dL
Ketones, ur: 80 mg/dL — AB
Leukocytes,Ua: NEGATIVE
Nitrite: NEGATIVE
Protein, ur: 100 mg/dL — AB
Specific Gravity, Urine: 1.015 (ref 1.005–1.030)
pH: 7.5 (ref 5.0–8.0)

## 2021-11-21 LAB — HIV ANTIBODY (ROUTINE TESTING W REFLEX): HIV Screen 4th Generation wRfx: NONREACTIVE

## 2021-11-21 LAB — LIPASE, BLOOD: Lipase: 120 U/L — ABNORMAL HIGH (ref 11–51)

## 2021-11-21 LAB — MAGNESIUM: Magnesium: 1.3 mg/dL — ABNORMAL LOW (ref 1.7–2.4)

## 2021-11-21 MED ORDER — POTASSIUM CHLORIDE 10 MEQ/100ML IV SOLN
10.0000 meq | INTRAVENOUS | Status: AC
  Administered 2021-11-21 (×3): 10 meq via INTRAVENOUS
  Filled 2021-11-21 (×4): qty 100

## 2021-11-21 MED ORDER — HYDRALAZINE HCL 20 MG/ML IJ SOLN
10.0000 mg | Freq: Four times a day (QID) | INTRAMUSCULAR | Status: DC | PRN
  Administered 2021-11-22 – 2021-11-23 (×2): 10 mg via INTRAVENOUS
  Filled 2021-11-21 (×2): qty 1

## 2021-11-21 MED ORDER — MAGNESIUM SULFATE 2 GM/50ML IV SOLN
2.0000 g | Freq: Once | INTRAVENOUS | Status: AC
  Administered 2021-11-21: 2 g via INTRAVENOUS
  Filled 2021-11-21: qty 50

## 2021-11-21 MED ORDER — POTASSIUM CHLORIDE CRYS ER 20 MEQ PO TBCR
40.0000 meq | EXTENDED_RELEASE_TABLET | Freq: Once | ORAL | Status: AC
  Administered 2021-11-21: 40 meq via ORAL
  Filled 2021-11-21: qty 2

## 2021-11-21 NOTE — Progress Notes (Signed)
?PROGRESS NOTE ? ? ? ?Zanyah Pringle  KXF:[id number] DOB: 1981/01/28 DOA: 11/20/2021 ?PCP: [facility]  ? ? ?Brief Narrative:  ?Josefa Mcmichael is a 41 y.o. African-American female with medical history significant for asthma, GERD, chronic pain, hypertension and pancreatitis, who presented to the emergency room with acute onset of epigastric and left upper quadrant abdominal pain with associated nausea and vomiting.   Found to have pancreatitis due to alcohol use.   ?  ? ? ?Assessment and Plan: ?* Acute alcoholic pancreatitis ?- IVF, bowel rest, pain management (patient knows she will not be pain free and will not be fed until pain resolved) ?- cessation of alcohol. ? ?Alcohol abuse ?- She had 6-7 liquor shots last night.  She states that she is a social drinker though. ?-  thiamine, folic acid and multivitamins. ?- She will be monitored for alcohol withdrawal with CIWA protocol. ? ?Essential hypertension ?-continue amlodipine, Coreg and Zestril  ?-PRN IV hydralazine ? ?(HFpEF) heart failure with preserved ejection fraction ([place]) ?- We will continue Coreg and Zestril and hold off Lasix for now. ? ?GERD without esophagitis ?- PPI therapy and Carafate. ? ?Peripheral neuropathy ? - continue Lyrica. ? ?Asthma, chronic ?-continue Singulair, ProAir and Symbicort. ? ? ? ? ? ? ? ? ? ? ?  Code Status: Full Code ? ? ?Disposition Plan:  ?Level of care: Telemetry Medical ?Status is: Inpatient ?Remains inpatient appropriate because: NPO requiring pain meds ?  ? ?Consultants:  ?none ? ? ?Subjective: ?C/o pain-- states pain meds due at 8:30 am ? ?Objective: ?Vitals:  ? 11/21/21 0040 11/21/21 0130 11/21/21 0536 11/21/21 0818  ?BP: (!) 185/93 (!) 156/70 (!) 193/87 (!) 193/94  ?Pulse: 73 67 73 64  ?Resp: '20  18 17  '$ ?Temp: 99.5 ?F (37.5 ?C)  99.2 ?F (37.3 ?C) 97.8 ?F (36.6 ?C)  ?TempSrc:      ?SpO2: 100%  96% 98%  ?Weight: 101.9 kg     ?Height: '6\' 9"'$  (2.057 m)     ? ? ?Intake/Output Summary (Last 24 hours) at 11/21/2021 0858 ?Last  data filed at 11/20/2021 2215 ?Gross per 24 hour  ?Intake 1000 ml  ?Output --  ?Net 1000 ml  ? ?Filed Weights  ? 11/20/21 1758 11/21/21 0040  ?Weight: 104.8 kg 101.9 kg  ? ? ?Examination: ? ? ?General: Appearance:    Well developed, well nourished female in no acute distress  ?   ?Lungs:      respirations unlabored  ?Heart:    Normal heart rate. Normal rhythm. No murmurs, rubs, or gallops.  ?  ?MS:   All extremities are intact.  ?  ?Neurologic:   Awake, alert, oriented x 3. No apparent focal neurological           defect.   ?  ? ? ? ?Data Reviewed: I have personally reviewed following labs and imaging studies ? ?CBC: ?Recent Labs  ?Lab 11/20/21 ?1759 11/21/21 ?0542  ?WBC 6.5 5.5  ?HGB 14.9 12.7  ?HCT 45.1 38.1  ?MCV 91.3 91.6  ?PLT 312 227  ? ?Basic Metabolic Panel: ?Recent Labs  ?Lab 11/20/21 ?1759 11/21/21 ?0542  ?NA 138 138  ?K 3.5 3.0*  ?CL 104 104  ?CO2 21* 26  ?GLUCOSE 113* 99  ?BUN 10 8  ?CREATININE 0.75 0.75  ?CALCIUM 9.2 8.4*  ? ?GFR: ?Estimated Creatinine Clearance: 138.4 mL/min (by C-G formula based on SCr of 0.75 mg/dL). ?Liver Function Tests: ?Recent Labs  ?Lab 11/20/21 ?1759  ?AST 30  ?ALT  17  ?ALKPHOS 66  ?BILITOT 0.6  ?PROT 8.2*  ?ALBUMIN 4.2  ? ?Recent Labs  ?Lab 11/20/21 ?1759 11/21/21 ?0542  ?LIPASE 188* 120*  ? ?No results for input(s): AMMONIA in the last 168 hours. ?Coagulation Profile: ?No results for input(s): INR, PROTIME in the last 168 hours. ?Cardiac Enzymes: ?No results for input(s): CKTOTAL, CKMB, CKMBINDEX, TROPONINI in the last 168 hours. ?BNP (last 3 results) ?No results for input(s): PROBNP in the last 8760 hours. ?HbA1C: ?No results for input(s): HGBA1C in the last 72 hours. ?CBG: ?No results for input(s): GLUCAP in the last 168 hours. ?Lipid Profile: ?No results for input(s): CHOL, HDL, LDLCALC, TRIG, CHOLHDL, LDLDIRECT in the last 72 hours. ?Thyroid Function Tests: ?No results for input(s): TSH, T4TOTAL, FREET4, T3FREE, THYROIDAB in the last 72 hours. ?Anemia Panel: ?No results for  input(s): VITAMINB12, FOLATE, FERRITIN, TIBC, IRON, RETICCTPCT in the last 72 hours. ?Sepsis Labs: ?No results for input(s): PROCALCITON, LATICACIDVEN in the last 168 hours. ? ?No results found for this or any previous visit (from the past 240 hour(s)).  ? ? ? ? ? ?Radiology Studies: ?CT ABDOMEN PELVIS W CONTRAST ? ?Result Date: 11/20/2021 ?CLINICAL DATA:  Right upper quadrant pain. EXAM: CT ABDOMEN AND PELVIS WITH CONTRAST TECHNIQUE: Multidetector CT imaging of the abdomen and pelvis was performed using the standard protocol following bolus administration of intravenous contrast. RADIATION DOSE REDUCTION: This exam was performed according to the departmental dose-optimization program which includes automated exposure control, adjustment of the mA and/or kV according to patient size and/or use of iterative reconstruction technique. CONTRAST:  160m OMNIPAQUE IOHEXOL 300 MG/ML  SOLN COMPARISON:  October 30, 2021 FINDINGS: Lower chest: No acute abnormality. Hepatobiliary: No focal liver abnormality is seen. No gallstones, gallbladder wall thickening, or biliary dilatation. Pancreas: There is moderate to marked severity peripancreatic inflammatory fat stranding. No ductal dilatation is identified. Spleen: Normal in size without focal abnormality. Adrenals/Urinary Tract: Adrenal glands are unremarkable. Kidneys are normal, without renal calculi, focal lesion, or hydronephrosis. Bladder is unremarkable. Stomach/Bowel: Stomach is within normal limits. Appendix appears normal. No evidence of bowel wall thickening, distention, or inflammatory changes. Vascular/Lymphatic: No significant vascular findings are present. No enlarged abdominal or pelvic lymph nodes. Reproductive: Status post hysterectomy. No adnexal masses. Other: No abdominal wall hernia or abnormality. No abdominopelvic ascites. Musculoskeletal: No acute or significant osseous findings. IMPRESSION: Moderate to marked severity acute pancreatitis. Correlation with  pancreatic enzymes is recommended. Electronically Signed   By: TVirgina NorfolkM.D.   On: 11/20/2021 20:52   ? ? ? ? ? ?Scheduled Meds: ? amLODipine  10 mg Oral Daily  ? carvedilol  25 mg Oral BID WC  ? chlorhexidine  15 mL Mouth/Throat BID  ? enoxaparin (LOVENOX) injection  0.5 mg/kg Subcutaneous QQ59D ? folic acid  1 mg Oral Daily  ? irbesartan  300 mg Oral Daily  ? multivitamin with minerals  1 tablet Oral Daily  ? pantoprazole  40 mg Oral Daily  ? pregabalin  75 mg Oral BID  ? spironolactone  50 mg Oral Daily  ? sucralfate  1 g Oral QID  ? thiamine  100 mg Oral Daily  ? traZODone  50 mg Oral QHS  ? ?Continuous Infusions: ? sodium chloride 150 mL/hr at 11/21/21 0830  ? ? ? LOS: 1 day  ? ? ?Time spent: 65 minutes spent on chart review, discussion with nursing staff, consultants, updating family and interview/physical exam; more than 50% of that time was spent in counseling and/or  coordination of care. ? ? ? ?Geradine Girt, DO ?[facility] ?Available via Epic secure chat 7am-7pm ?After these hours, please refer to coverage provider listed on [email or web address] ?11/21/2021, 8:58 AM   ?

## 2021-11-21 NOTE — Progress Notes (Signed)
Mobility Specialist - Progress Note ? ? 11/21/21 1600  ?Mobility  ?Activity Refused mobility  ? ? ?Pt refuses mobility no reason specified but is indep in room. Second attempt this date, will attempt at another date and time. ? ?Sarah Sosa ?Mobility Specialist ?11/21/21, 4:12 PM ? ? ? ? ?

## 2021-11-21 NOTE — Hospital Course (Signed)
Sarah Sosa is a 41 y.o. African-American female with medical history significant for asthma, GERD, chronic pain, hypertension and pancreatitis, who presented to the emergency room with acute onset of epigastric and left upper quadrant abdominal pain with associated nausea and vomiting.   Found to have pancreatitis due to alcohol use.   ?

## 2021-11-22 DIAGNOSIS — K852 Alcohol induced acute pancreatitis without necrosis or infection: Secondary | ICD-10-CM | POA: Diagnosis not present

## 2021-11-22 DIAGNOSIS — F1010 Alcohol abuse, uncomplicated: Secondary | ICD-10-CM | POA: Diagnosis not present

## 2021-11-22 LAB — CBC
HCT: 40.3 % (ref 36.0–46.0)
Hemoglobin: 13.2 g/dL (ref 12.0–15.0)
MCH: 31 pg (ref 26.0–34.0)
MCHC: 32.8 g/dL (ref 30.0–36.0)
MCV: 94.6 fL (ref 80.0–100.0)
Platelets: 225 10*3/uL (ref 150–400)
RBC: 4.26 MIL/uL (ref 3.87–5.11)
RDW: 12.5 % (ref 11.5–15.5)
WBC: 4.1 10*3/uL (ref 4.0–10.5)
nRBC: 0 % (ref 0.0–0.2)

## 2021-11-22 LAB — BASIC METABOLIC PANEL
Anion gap: 8 (ref 5–15)
BUN: 6 mg/dL (ref 6–20)
CO2: 25 mmol/L (ref 22–32)
Calcium: 9 mg/dL (ref 8.9–10.3)
Chloride: 105 mmol/L (ref 98–111)
Creatinine, Ser: 0.83 mg/dL (ref 0.44–1.00)
GFR, Estimated: >60 mL/min (ref >60)
Glucose, Bld: 92 mg/dL (ref 70–99)
Potassium: 3.5 mmol/L (ref 3.5–5.1)
Sodium: 138 mmol/L (ref 135–145)

## 2021-11-22 LAB — LIPASE, BLOOD: Lipase: 73 U/L — ABNORMAL HIGH (ref 11–51)

## 2021-11-22 MED ORDER — BOOST / RESOURCE BREEZE PO LIQD CUSTOM
1.0000 | Freq: Three times a day (TID) | ORAL | Status: DC
  Administered 2021-11-22 – 2021-11-23 (×3): 1 via ORAL

## 2021-11-22 MED ORDER — OXYCODONE HCL 5 MG PO TABS
5.0000 mg | ORAL_TABLET | Freq: Once | ORAL | Status: AC
  Administered 2021-11-22: 5 mg via ORAL
  Filled 2021-11-22: qty 1

## 2021-11-22 MED ORDER — PROSOURCE PLUS PO LIQD
30.0000 mL | Freq: Two times a day (BID) | ORAL | Status: DC
  Administered 2021-11-23: 30 mL via ORAL
  Filled 2021-11-22 (×7): qty 30

## 2021-11-22 MED ORDER — NALOXONE HCL 0.4 MG/ML IJ SOLN: 0.4000 mg | INTRAMUSCULAR | Status: DC | PRN

## 2021-11-22 MED ORDER — MELATONIN 5 MG PO TABS
5.0000 mg | ORAL_TABLET | Freq: Every evening | ORAL | Status: DC | PRN
  Administered 2021-11-22 – 2021-11-26 (×5): 5 mg via ORAL
  Filled 2021-11-22 (×5): qty 1

## 2021-11-22 MED ORDER — OXYCODONE HCL 5 MG PO TABS
5.0000 mg | ORAL_TABLET | ORAL | Status: DC | PRN
  Administered 2021-11-22 (×2): 5 mg via ORAL
  Filled 2021-11-22 (×2): qty 1

## 2021-11-22 MED ORDER — OXYCODONE HCL 5 MG PO TABS
10.0000 mg | ORAL_TABLET | ORAL | Status: DC | PRN
  Administered 2021-11-22: 5 mg via ORAL
  Administered 2021-11-22 – 2021-11-27 (×20): 10 mg via ORAL
  Filled 2021-11-22 (×22): qty 2

## 2021-11-22 NOTE — Progress Notes (Signed)
Mobility Specialist - Progress Note ? ? ? 11/22/21 1200  ?Mobility  ?Activity Ambulated independently to bathroom  ?Level of Assistance Independent  ?Assistive Device Front wheel walker  ?Distance Ambulated (ft) 180 ft  ?Activity Response Tolerated well  ?$Mobility charge 1 Mobility  ? ? ?Pt sitting in bed upon arrival using RA. Completes all activities ModI due to pain. Pt ambulates grimacing throughout. Returns to bed with needs in reach. ? ?Merrily Brittle ?Mobility Specialist ?11/22/21, 12:55 PM ? ? ? ? ?

## 2021-11-22 NOTE — Progress Notes (Signed)
?PROGRESS NOTE ? ? ? ?Melady Treu  MPN:[id number] DOB: [birth year] DOA: 11/20/2021 ?PCP: [facility]  ? ? ?Brief Narrative:  ?Verlon Welge is a 41 y.o. African-American female with medical history significant for asthma, GERD, chronic pain, hypertension and pancreatitis, who presented to the emergency room with acute onset of epigastric and left upper quadrant abdominal pain with associated nausea and vomiting.   Found to have pancreatitis due to alcohol use.   ?  ? ? ?Assessment and Plan: ?* Acute alcoholic pancreatitis ?- IVF ?-patient asking for diet today ?-long discussion with patient that with addition of diet, her pain meds will change-- d/c dilaudid, continue Toradol IV and add po oxycodone ?- cessation of alcohol. ? ?Alcohol abuse ?- She had 6-7 liquor shots prior to admission  She states that she is a social drinker though. ?-  thiamine, folic acid and multivitamins. ?- She will be monitored for alcohol withdrawal with CIWA protocol. ? ?Essential hypertension ?-continue amlodipine, Coreg and Zestril  ?-PRN IV hydralazine ? ?(HFpEF) heart failure with preserved ejection fraction ([facility]) ?-  continue Coreg and Zestril and hold off Lasix for now. ? ?GERD without esophagitis ?- PPI therapy and Carafate. ? ?Peripheral neuropathy ? - continue Lyrica. ? ?Asthma, chronic ?-continue Singulair, ProAir and Symbicort. ? ? ? ?Hypokalemia/hypomagnesemia ?-repleted ? ? ? ? ? ?  Code Status: Full Code ? ? ?Disposition Plan:  ?Level of care: Med-Surg ?Status is: Inpatient ?Remains inpatient appropriate because: NPO requiring pain meds ?  ? ?Consultants:  ?none ? ? ?Subjective: ?Asking for something to eat ? ?Objective: ?Vitals:  ? 11/21/21 1741 11/21/21 2252 11/22/21 0508 11/22/21 0915  ?BP:  (!) 159/92 (!) 162/80 (!) 159/68  ?Pulse: 68 68 62 60  ?Resp:  '16 16 16  '$ ?Temp:  98.4 ?F (36.9 ?C) 98.1 ?F (36.7 ?C) 98.4 ?F (36.9 ?C)  ?TempSrc:    Oral  ?SpO2:  94% 96% 98%  ?Weight:      ?Height:      ? ? ?Intake/Output  Summary (Last 24 hours) at 11/22/2021 1201 ?Last data filed at 11/22/2021 0409 ?Gross per 24 hour  ?Intake 3287.58 ml  ?Output --  ?Net 3287.58 ml  ? ?Filed Weights  ? 11/20/21 1758 11/21/21 0040  ?Weight: 104.8 kg 101.9 kg  ? ? ?Examination: ? ? ? ?General: Appearance:    Well developed, well nourished female in no acute distress  ?   ?Lungs:     respirations unlabored  ?Heart:    Normal heart rate.  ?  ?MS:   All extremities are intact.  ?  ?Neurologic:   Awake, alert  ?  ?  ? ? ? ?Data Reviewed: I have personally reviewed following labs and imaging studies ? ?CBC: ?Recent Labs  ?Lab 11/20/21 ?1759 11/21/21 ?0086 11/22/21 ?7619  ?WBC 6.5 5.5 4.1  ?HGB 14.9 12.7 13.2  ?HCT 45.1 38.1 40.3  ?MCV 91.3 91.6 94.6  ?PLT 312 227 225  ? ?Basic Metabolic Panel: ?Recent Labs  ?Lab 11/20/21 ?1759 11/21/21 ?5093 11/22/21 ?2671  ?NA 138 138 138  ?K 3.5 3.0* 3.5  ?CL 104 104 105  ?CO2 21* 26 25  ?GLUCOSE 113* 99 92  ?BUN '10 8 6  '$ ?CREATININE 0.75 0.75 0.83  ?CALCIUM 9.2 8.4* 9.0  ?MG  --  1.3*  --   ? ?GFR: ?Estimated Creatinine Clearance: 133.4 mL/min (by C-G formula based on SCr of 0.83 mg/dL). ?Liver Function Tests: ?Recent Labs  ?Lab 11/20/21 ?1759  ?AST 30  ?ALT  17  ?ALKPHOS 66  ?BILITOT 0.6  ?PROT 8.2*  ?ALBUMIN 4.2  ? ?Recent Labs  ?Lab 11/20/21 ?1759 11/21/21 ?8416 11/22/21 ?6063  ?LIPASE 188* 120* 73*  ? ?No results for input(s): AMMONIA in the last 168 hours. ?Coagulation Profile: ?No results for input(s): INR, PROTIME in the last 168 hours. ?Cardiac Enzymes: ?No results for input(s): CKTOTAL, CKMB, CKMBINDEX, TROPONINI in the last 168 hours. ?BNP (last 3 results) ?No results for input(s): PROBNP in the last 8760 hours. ?HbA1C: ?No results for input(s): HGBA1C in the last 72 hours. ?CBG: ?No results for input(s): GLUCAP in the last 168 hours. ?Lipid Profile: ?No results for input(s): CHOL, HDL, LDLCALC, TRIG, CHOLHDL, LDLDIRECT in the last 72 hours. ?Thyroid Function Tests: ?No results for input(s): TSH, T4TOTAL, FREET4,  T3FREE, THYROIDAB in the last 72 hours. ?Anemia Panel: ?No results for input(s): VITAMINB12, FOLATE, FERRITIN, TIBC, IRON, RETICCTPCT in the last 72 hours. ?Sepsis Labs: ?No results for input(s): PROCALCITON, LATICACIDVEN in the last 168 hours. ? ?No results found for this or any previous visit (from the past 240 hour(s)).  ? ? ? ? ? ?Radiology Studies: ?CT ABDOMEN PELVIS W CONTRAST ? ?Result Date: 11/20/2021 ?CLINICAL DATA:  Right upper quadrant pain. EXAM: CT ABDOMEN AND PELVIS WITH CONTRAST TECHNIQUE: Multidetector CT imaging of the abdomen and pelvis was performed using the standard protocol following bolus administration of intravenous contrast. RADIATION DOSE REDUCTION: This exam was performed according to the departmental dose-optimization program which includes automated exposure control, adjustment of the mA and/or kV according to patient size and/or use of iterative reconstruction technique. CONTRAST:  169m OMNIPAQUE IOHEXOL 300 MG/ML  SOLN COMPARISON:  October 30, 2021 FINDINGS: Lower chest: No acute abnormality. Hepatobiliary: No focal liver abnormality is seen. No gallstones, gallbladder wall thickening, or biliary dilatation. Pancreas: There is moderate to marked severity peripancreatic inflammatory fat stranding. No ductal dilatation is identified. Spleen: Normal in size without focal abnormality. Adrenals/Urinary Tract: Adrenal glands are unremarkable. Kidneys are normal, without renal calculi, focal lesion, or hydronephrosis. Bladder is unremarkable. Stomach/Bowel: Stomach is within normal limits. Appendix appears normal. No evidence of bowel wall thickening, distention, or inflammatory changes. Vascular/Lymphatic: No significant vascular findings are present. No enlarged abdominal or pelvic lymph nodes. Reproductive: Status post hysterectomy. No adnexal masses. Other: No abdominal wall hernia or abnormality. No abdominopelvic ascites. Musculoskeletal: No acute or significant osseous findings.  IMPRESSION: Moderate to marked severity acute pancreatitis. Correlation with pancreatic enzymes is recommended. Electronically Signed   By: TVirgina NorfolkM.D.   On: 11/20/2021 20:52   ? ? ? ? ? ?Scheduled Meds: ? amLODipine  10 mg Oral Daily  ? carvedilol  25 mg Oral BID WC  ? chlorhexidine  15 mL Mouth/Throat BID  ? enoxaparin (LOVENOX) injection  0.5 mg/kg Subcutaneous QK16W ? folic acid  1 mg Oral Daily  ? irbesartan  300 mg Oral Daily  ? multivitamin with minerals  1 tablet Oral Daily  ? pantoprazole  40 mg Oral Daily  ? pregabalin  75 mg Oral BID  ? sucralfate  1 g Oral QID  ? thiamine  100 mg Oral Daily  ? traZODone  50 mg Oral QHS  ? ?Continuous Infusions: ? sodium chloride 100 mL/hr at 11/22/21 1056  ? ? ? LOS: 2 days  ? ? ?Time spent: 65 minutes spent on chart review, discussion with nursing staff, consultants, updating family and interview/physical exam; more than 50% of that time was spent in counseling and/or coordination of care. ? ? ? ?  Geradine Girt, DO ?[facility] ?Available via Epic secure chat 7am-7pm ?After these hours, please refer to coverage provider listed on [email or web address] ?11/22/2021, 12:01 PM   ?

## 2021-11-22 NOTE — Progress Notes (Signed)
Initial Nutrition Assessment ? ?DOCUMENTATION CODES:  ? ?Obesity unspecified ? ?INTERVENTION:  ? ?-Boost Breeze po TID, each supplement provides 250 kcal and 9 grams of protein  ?-30 ml Prosource Plus BID, each supplement provides 100 kcals and 15 grams protein ?-MVI with minerals daily ?-RD will follow for diet advancement and add supplements as appropriate ? ?NUTRITION DIAGNOSIS:  ? ?Increased nutrient needs related to acute illness (pancreatitis) as evidenced by estimated needs. ? ?GOAL:  ? ?Patient will meet greater than or equal to 90% of their needs ? ?MONITOR:  ? ?PO intake, Supplement acceptance, Diet advancement ? ?REASON FOR ASSESSMENT:  ? ?Malnutrition Screening Tool ?  ? ?ASSESSMENT:  ? ?Pt with medical history significant for asthma, GERD, chronic pain, hypertension and pancreatitis, who presented with acute onset of epigastric and left upper quadrant abdominal pain with associated nausea and vomiting since this morning ? ?Pt admitted with acute alcoholic pancreatitis.  ? ?Reviewed I/O's: +3.3 L x 24 hours and +4.3 L since admission ? ?Spoke with pt at bedside, who reports feeling a little hungry today. Per pt, she has been unable to keep foods and liquids down over the past 2 days. Prior to acute illness, pt reports having a good appetite, consuming 2 meals per day (spaghetti and corn OR pork chops and potatoes OR street tacos).  ? ?Per pt, here UBW is around 220-230#. She shares that her weight fluctuates throughout the year- heavier in the winter and lighter in the summer. Reviewed wt hx; pt has experienced a 2.8% wt loss over the past month, which is not significant for time frame.  ? ?Pt shares that she is a social drinker and admits to drinking excessively on 11/19/21. She has had pancreatitis in the past.  ? ?Discussed importance of good meal and supplement intake to promote healing. Pt had just advanced to clear liquids and was eating jello without difficulty. She is aware that we will likely  experience some pain with eating. She is amenable to [employer].  ? ?Medications reviewed and include folic acid, aldactone, thiamine, and 0.9% sodium chloride infusion @ 150 ml/hr.  ? ?Labs reviewed. Mg: 1.3.  ? ?NUTRITION - FOCUSED PHYSICAL EXAM: ? ?Flowsheet Row Most Recent Value  ?Orbital Region No depletion  ?Upper Arm Region No depletion  ?Thoracic and Lumbar Region No depletion  ?Buccal Region No depletion  ?Temple Region No depletion  ?Clavicle Bone Region No depletion  ?Clavicle and Acromion Bone Region No depletion  ?Scapular Bone Region No depletion  ?Dorsal Hand No depletion  ?Patellar Region No depletion  ?Anterior Thigh Region No depletion  ?Posterior Calf Region No depletion  ?Edema (RD Assessment) None  ?Hair Reviewed  ?Eyes Reviewed  ?Mouth Reviewed  ?Skin Reviewed  ?Nails Reviewed  ? ?  ? ? ?Diet Order:   ?Diet Order   ? ?       ?  Diet clear liquid Room service appropriate? Yes; Fluid consistency: Thin  Diet effective now       ?  ? ?  ?  ? ?  ? ? ?EDUCATION NEEDS:  ? ?Education needs have been addressed ? ?Skin:  Skin Assessment: Reviewed RN Assessment ? ?Last BM:  11/22/21 ? ?Height:  ? ?Ht Readings from Last 1 Encounters:  ?11/22/21 5' 6.5" (1.689 m)  ? ? ?Weight:  ? ?Wt Readings from Last 1 Encounters:  ?11/21/21 101.9 kg  ? ? ?Ideal Body Weight:  60.2 kg ? ?BMI:  Body mass index is 35.72 kg/m?Marland Kitchen ? ?  Estimated Nutritional Needs:  ? ?Kcal:  1900-2100 ? ?Protein:  105-120 grams ? ?Fluid:  > 1.9 L ? ? ? ?Loistine Chance, RD, LDN, CDCES ?Registered Dietitian II ?Certified Diabetes Care and Education Specialist ?Please refer to [facility] for RD and/or RD on-call/weekend/after hours pager  ?

## 2021-11-23 DIAGNOSIS — K852 Alcohol induced acute pancreatitis without necrosis or infection: Secondary | ICD-10-CM | POA: Diagnosis not present

## 2021-11-23 DIAGNOSIS — E876 Hypokalemia: Secondary | ICD-10-CM

## 2021-11-23 DIAGNOSIS — G6289 Other specified polyneuropathies: Secondary | ICD-10-CM

## 2021-11-23 DIAGNOSIS — R002 Palpitations: Secondary | ICD-10-CM

## 2021-11-23 DIAGNOSIS — F1010 Alcohol abuse, uncomplicated: Secondary | ICD-10-CM | POA: Diagnosis not present

## 2021-11-23 LAB — BASIC METABOLIC PANEL
Anion gap: 9 (ref 5–15)
BUN: <5 mg/dL — ABNORMAL LOW (ref 6–20)
CO2: 24 mmol/L (ref 22–32)
Calcium: 9.2 mg/dL (ref 8.9–10.3)
Chloride: 101 mmol/L (ref 98–111)
Creatinine, Ser: 0.78 mg/dL (ref 0.44–1.00)
GFR, Estimated: >60 mL/min (ref >60)
Glucose, Bld: 149 mg/dL — ABNORMAL HIGH (ref 70–99)
Potassium: 3.1 mmol/L — ABNORMAL LOW (ref 3.5–5.1)
Sodium: 134 mmol/L — ABNORMAL LOW (ref 135–145)

## 2021-11-23 LAB — LIPASE, BLOOD: Lipase: 54 U/L — ABNORMAL HIGH (ref 11–51)

## 2021-11-23 LAB — MAGNESIUM: Magnesium: 1.4 mg/dL — ABNORMAL LOW (ref 1.7–2.4)

## 2021-11-23 MED ORDER — POTASSIUM CHLORIDE CRYS ER 20 MEQ PO TBCR
40.0000 meq | EXTENDED_RELEASE_TABLET | Freq: Once | ORAL | Status: AC
  Administered 2021-11-23: 40 meq via ORAL
  Filled 2021-11-23: qty 2

## 2021-11-23 MED ORDER — HYDROMORPHONE HCL 1 MG/ML IJ SOLN
1.0000 mg | INTRAMUSCULAR | Status: DC | PRN
  Administered 2021-11-23 – 2021-11-25 (×15): 1 mg via INTRAVENOUS
  Filled 2021-11-23 (×16): qty 1

## 2021-11-23 MED ORDER — MAGNESIUM SULFATE 4 GM/100ML IV SOLN
4.0000 g | Freq: Once | INTRAVENOUS | Status: AC
  Administered 2021-11-23: 4 g via INTRAVENOUS
  Filled 2021-11-23: qty 100

## 2021-11-23 MED ORDER — KETOROLAC TROMETHAMINE 15 MG/ML IJ SOLN
15.0000 mg | Freq: Once | INTRAMUSCULAR | Status: AC
  Administered 2021-11-23: 15 mg via INTRAVENOUS
  Filled 2021-11-23: qty 1

## 2021-11-23 MED ORDER — HYDROMORPHONE HCL 1 MG/ML IJ SOLN
0.5000 mg | Freq: Once | INTRAMUSCULAR | Status: AC
  Administered 2021-11-23: 0.5 mg via INTRAVENOUS
  Filled 2021-11-23: qty 1

## 2021-11-23 MED ORDER — POTASSIUM CHLORIDE IN NACL 20-0.9 MEQ/L-% IV SOLN
INTRAVENOUS | Status: DC
Start: 2021-11-23 — End: 2021-11-26
  Filled 2021-11-23 (×8): qty 1000

## 2021-11-23 NOTE — Progress Notes (Signed)
?  Progress Note ? ? ?Patient: Sarah Sosa PJK:[id number] DOB: 03-28-1981 DOA: 11/20/2021     3 ?DOS: the patient was seen and examined on 11/23/2021 ?  ?Brief hospital course: ?Sarah Sosa is a 41 y.o. African-American female with medical history significant for asthma, GERD, chronic pain, hypertension and pancreatitis, who presented to the emergency room with acute onset of epigastric and left upper quadrant abdominal pain with associated nausea and vomiting.   Found to have pancreatitis due to alcohol use.   ? ?Assessment and Plan: ?* Acute alcoholic pancreatitis ?Patient had worsening pain after drinking liquids today.  Went back to n.p.o.  Continue IV fluid hydration. ?Added short acting oral pain medications and added back IV Dilaudid. ? ?Alcohol abuse ?Continue alcohol withdrawal protocol.  Continue thiamine folic acid and multivitamin. ? ?Hypomagnesemia ?Replace magnesium 4 g IV today ? ?Hypokalemia ?Replace potassium and IV fluids and oral potassium today. ? ?(HFpEF) heart failure with preserved ejection fraction ([place]) ?Continue Coreg and Zestril and hold off Lasix for now.  No signs of heart failure currently. ? ?GERD without esophagitis ?Continue PPI therapy and Carafate. ? ?Essential hypertension ?Continue amlodipine, Coreg and Zestril ? ?Peripheral neuropathy ?Continue Lyrica. ? ?Asthma, chronic ?Continue Singulair, ProAir and Symbicort. ? ? ? ? ?  ? ?Subjective: Patient states that she has been crying in pain for 2 days.  She states that Dilaudid helps her.  She states that at [facility] they give her IV Dilaudid pump.  Patient's pain is 10 out of 10 in intensity. ? ?Physical Exam: ?Vitals:  ? 11/23/21 0641 11/23/21 0909 11/23/21 1137 11/23/21 1614  ?BP: (!) 172/91 (!) 145/78 (!) 155/79 119/68  ?Pulse: 72 66 72 (!) 57  ?Resp:  '20 16 16  '$ ?Temp:  98.7 ?F (37.1 ?C) 98.6 ?F (37 ?C) 98 ?F (36.7 ?C)  ?TempSrc:    Oral  ?SpO2: 100% 95% 95% 99%  ?Weight:      ?Height:      ? ?Physical Exam ?HENT:  ?   Head:  Normocephalic.  ?   Mouth/Throat:  ?   Pharynx: No oropharyngeal exudate.  ?Eyes:  ?   General: Lids are normal.  ?   Conjunctiva/sclera: Conjunctivae normal.  ?Cardiovascular:  ?   Rate and Rhythm: Normal rate and regular rhythm.  ?   Heart sounds: S1 normal and S2 normal. Murmur heard.  ?Systolic murmur is present with a grade of 2/6.  ?Pulmonary:  ?   Breath sounds: Normal breath sounds. No decreased breath sounds, wheezing, rhonchi or rales.  ?Abdominal:  ?   Palpations: Abdomen is soft.  ?   Tenderness: There is abdominal tenderness in the epigastric area and left upper quadrant.  ?Musculoskeletal:  ?   Right lower leg: No swelling.  ?   Left lower leg: No swelling.  ?Skin: ?   General: Skin is warm.  ?   Findings: No rash.  ?Neurological:  ?   Mental Status: She is alert and oriented to person, place, and time.  ?  ?Data Reviewed: ?Sodium 134, potassium 3.1, lipase 54 ? ?Disposition: ?Status is: Inpatient ?Remains inpatient appropriate because: Still in severe pain with pancreatitis. ? ?Planned Discharge Destination: Home ? ? ?Author: ?Loletha Grayer, MD ?11/23/2021 5:02 PM ? ?For on call review [email or web address].  ?

## 2021-11-23 NOTE — Assessment & Plan Note (Addendum)
Replaced into the normal range.  Will give low-dose potassium upon discharge. ?

## 2021-11-23 NOTE — Progress Notes (Addendum)
? ?      CROSS COVER NOTE ? ?NAME: Sarah Sosa ?MRN: [id number] ?DOB : 1980/12/26 ? ? ?Secure chat received from nursing reporting that M(r)s Preyer's pain has been uncontrolled since this morning when she ate jello. IV opioids discontinued today after long discussion with patient per rounders note and M(r)s Clare was in agreement with the plan. 5 mg Oxycodone ordered x1. ? ?3202 RN reports Ms Carberry is continuing to experience 10/10 abdominal pain. IV Toradol Ordered x1. ? ?3343 Ms Basurto is reporting palpitations. EKG, Tele, BMP, and Mg ordered. ? ?Neomia Glass MHA, MSN, FNP-BC ?Nurse Practitioner ?[facility] ?[place] ?Pager [phone] ? ? ?

## 2021-11-23 NOTE — Assessment & Plan Note (Addendum)
Replaced again IV today on a magnesium of 1.6.  We will also give oral supplementation upon discharge. ?

## 2021-11-24 DIAGNOSIS — I5032 Chronic diastolic (congestive) heart failure: Secondary | ICD-10-CM

## 2021-11-24 DIAGNOSIS — E876 Hypokalemia: Secondary | ICD-10-CM | POA: Diagnosis not present

## 2021-11-24 DIAGNOSIS — F1010 Alcohol abuse, uncomplicated: Secondary | ICD-10-CM | POA: Diagnosis not present

## 2021-11-24 DIAGNOSIS — K852 Alcohol induced acute pancreatitis without necrosis or infection: Secondary | ICD-10-CM | POA: Diagnosis not present

## 2021-11-24 LAB — BASIC METABOLIC PANEL
Anion gap: 7 (ref 5–15)
BUN: 6 mg/dL (ref 6–20)
CO2: 24 mmol/L (ref 22–32)
Calcium: 8.7 mg/dL — ABNORMAL LOW (ref 8.9–10.3)
Chloride: 104 mmol/L (ref 98–111)
Creatinine, Ser: 0.88 mg/dL (ref 0.44–1.00)
GFR, Estimated: >60 mL/min (ref >60)
Glucose, Bld: 98 mg/dL (ref 70–99)
Potassium: 3.4 mmol/L — ABNORMAL LOW (ref 3.5–5.1)
Sodium: 135 mmol/L (ref 135–145)

## 2021-11-24 LAB — PHOSPHORUS: Phosphorus: 3.8 mg/dL (ref 2.5–4.6)

## 2021-11-24 LAB — MAGNESIUM: Magnesium: 2 mg/dL (ref 1.7–2.4)

## 2021-11-24 MED ORDER — PANCRELIPASE (LIP-PROT-AMYL) 12000-38000 UNITS PO CPEP
36000.0000 [IU] | ORAL_CAPSULE | Freq: Three times a day (TID) | ORAL | Status: DC
  Administered 2021-11-24 – 2021-11-25 (×4): 36000 [IU] via ORAL
  Filled 2021-11-24 (×4): qty 3

## 2021-11-24 NOTE — Progress Notes (Signed)
Mobility Specialist - Progress Note ? ? ? 11/24/21 1500  ?Mobility  ?Activity Ambulated independently in hallway;Stood at bedside;Dangled on edge of bed  ?Level of Assistance Modified independent, requires aide device or extra time  ?Assistive Device None  ?Distance Ambulated (ft) 320 ft  ?Activity Response Tolerated well  ?$Mobility charge 1 Mobility  ? ? ?Pre-mobility: HR, BP, SpO2 ?During mobility: HR, BP, SpO2 ?Post-mobility: HR, BP, SPO2 ? ?Pt completes all activities indep using RA. Pt voices no complaints and returns to room with needs in reach. ? ?Sarah Sosa ?Mobility Specialist ?11/24/21, 3:26 PM ? ? ?

## 2021-11-24 NOTE — Progress Notes (Signed)
?  Progress Note ? ? ?Patient: Sarah Sosa [id number]:[id number] DOB: 1980/07/25 DOA: 11/20/2021     4 ?DOS: the patient was seen and examined on 11/24/2021 ? ? ?Assessment and Plan: ?* Acute alcoholic pancreatitis ?Patient had worsening pain after drinking liquids today.  Went back to n.p.o.  Continue IV fluid hydration. ?Added short acting oral pain medications and added back IV Dilaudid. ? ?Alcohol abuse ?Continue alcohol withdrawal protocol.  Continue thiamine folic acid and multivitamin. ? ?Hypomagnesemia ?Replace magnesium 4 g IV today ? ?Hypokalemia ?Replace potassium and IV fluids and oral potassium today. ? ?(HFpEF) heart failure with preserved ejection fraction ([place]) ?Continue Coreg and Zestril and hold off Lasix for now.  No signs of heart failure currently. ? ?GERD without esophagitis ?Continue PPI therapy and Carafate. ? ?Essential hypertension ?Continue amlodipine, Coreg and Zestril ? ?Peripheral neuropathy ?Continue Lyrica. ? ?Asthma, chronic ?Continue Singulair, ProAir and Symbicort. ? ? ? ? ?  ? ?Subjective: Patient feels that her pain is under better control once I adjusted pain medications yesterday.  Pain is still 8 out of 10 in intensity but patient looks comfortable.  She wants to try clear liquid diet today and see what happens.  Admitted with pancreatitis. ? ?Physical Exam: ?Vitals:  ? 11/23/21 2055 11/24/21 0110 11/24/21 0409 11/24/21 0818  ?BP: (!) 157/96 (!) 141/79 (!) 156/76 (!) 160/100  ?Pulse: 63 (!) 57 61 70  ?Resp: '16 18 18 15  '$ ?Temp: 97.6 ?F (36.4 ?C) 97.7 ?F (36.5 ?C) 97.8 ?F (36.6 ?C) 97.7 ?F (36.5 ?C)  ?TempSrc:    Oral  ?SpO2: 99% 96% 97% 96%  ?Weight:      ?Height:      ? ?Physical Exam ?HENT:  ?   Head: Normocephalic.  ?   Mouth/Throat:  ?   Pharynx: No oropharyngeal exudate.  ?Eyes:  ?   General: Lids are normal.  ?   Conjunctiva/sclera: Conjunctivae normal.  ?Cardiovascular:  ?   Rate and Rhythm: Normal rate and regular rhythm.  ?   Heart sounds: S1 normal and S2 normal. Murmur heard.   ?Systolic murmur is present with a grade of 2/6.  ?Pulmonary:  ?   Breath sounds: Normal breath sounds. No decreased breath sounds, wheezing, rhonchi or rales.  ?Abdominal:  ?   Palpations: Abdomen is soft.  ?   Tenderness: There is abdominal tenderness in the epigastric area and left upper quadrant.  ?Musculoskeletal:  ?   Right lower leg: No swelling.  ?   Left lower leg: No swelling.  ?Skin: ?   General: Skin is warm.  ?   Findings: No rash.  ?Neurological:  ?   Mental Status: She is alert and oriented to person, place, and time.  ?  ?Data Reviewed: ?Potassium 3.4, calcium 8.7, rest of chemistry normal range. ? ?Disposition: ?Status is: Inpatient ?Remains inpatient appropriate because: Being treated with IV fluids for acute pancreatitis ? ?Planned Discharge Destination: Home ? ? ?Author: ?Loletha Grayer, MD ?11/24/2021 11:18 AM ? ?For on call review [email or web address].  ?

## 2021-11-25 DIAGNOSIS — E876 Hypokalemia: Secondary | ICD-10-CM | POA: Diagnosis not present

## 2021-11-25 DIAGNOSIS — K852 Alcohol induced acute pancreatitis without necrosis or infection: Secondary | ICD-10-CM | POA: Diagnosis not present

## 2021-11-25 DIAGNOSIS — F1010 Alcohol abuse, uncomplicated: Secondary | ICD-10-CM | POA: Diagnosis not present

## 2021-11-25 LAB — CBC
HCT: 36.1 % (ref 36.0–46.0)
Hemoglobin: 11.8 g/dL — ABNORMAL LOW (ref 12.0–15.0)
MCH: 30.7 pg (ref 26.0–34.0)
MCHC: 32.7 g/dL (ref 30.0–36.0)
MCV: 94 fL (ref 80.0–100.0)
Platelets: 204 10*3/uL (ref 150–400)
RBC: 3.84 MIL/uL — ABNORMAL LOW (ref 3.87–5.11)
RDW: 12.9 % (ref 11.5–15.5)
WBC: 3.9 10*3/uL — ABNORMAL LOW (ref 4.0–10.5)
nRBC: 0 % (ref 0.0–0.2)

## 2021-11-25 MED ORDER — ENSURE ENLIVE PO LIQD
237.0000 mL | Freq: Two times a day (BID) | ORAL | Status: DC
  Administered 2021-11-25 – 2021-11-27 (×2): 237 mL via ORAL

## 2021-11-25 MED ORDER — PANCRELIPASE (LIP-PROT-AMYL) 12000-38000 UNITS PO CPEP
72000.0000 [IU] | ORAL_CAPSULE | Freq: Three times a day (TID) | ORAL | Status: DC
  Administered 2021-11-25 – 2021-11-27 (×5): 72000 [IU] via ORAL
  Filled 2021-11-25 (×5): qty 6

## 2021-11-25 MED ORDER — HYDROMORPHONE HCL 1 MG/ML IJ SOLN
1.0000 mg | INTRAMUSCULAR | Status: DC | PRN
Start: 2021-11-25 — End: 2021-11-26
  Administered 2021-11-25 – 2021-11-26 (×6): 1 mg via INTRAVENOUS
  Filled 2021-11-25 (×6): qty 1

## 2021-11-25 NOTE — Progress Notes (Addendum)
Nutrition Follow-up ? ?DOCUMENTATION CODES:  ? ?Obesity unspecified ? ?INTERVENTION:  ? ?-Liberalize diet to GI soft ?-D/c Boost Breeze ?-D/c Prosource ?-Continue MVI with minerals daily ?-Ensure Enlive po BID, each supplement provides 350 kcal and 20 grams of protein ?-Provided "Pancreatitis Nutrition Therapy" handout from [facility]'s Nutrition Care Manual and attached to AVS/ discharge summary ? ?NUTRITION DIAGNOSIS:  ? ?Increased nutrient needs related to acute illness (pancreatitis) as evidenced by estimated needs. ? ?Ongoing ? ?GOAL:  ? ?Patient will meet greater than or equal to 90% of their needs ? ?Progressing  ? ?MONITOR:  ? ?PO intake, Supplement acceptance, Diet advancement ? ?REASON FOR ASSESSMENT:  ? ?Malnutrition Screening Tool ?  ? ?ASSESSMENT:  ? ?Pt with medical history significant for asthma, GERD, chronic pain, hypertension and pancreatitis, who presented with acute onset of epigastric and left upper quadrant abdominal pain with associated nausea and vomiting since this morning ? ?5/12- advanced to full liquid diet, advanced to heart healthy diet ? ?Pt unavailable at time of visit. Attempted to speak with pt via call to hospital room phone, however, unable to reach.  ? ?Pt just advanced to diet. Noted meal completions 0-75%. Pt refusing Prosource and Boost Breeze. Noted pt on a heart healthy diet; will liberalize diet to provide wider variety of meal selections.  ? ?Medications reviewed and include creon and thiamine.  ? ?Labs reviewed: K: 3.4. CBGS: 94. ? ?Diet Order:   ?Diet Order   ? ?       ?  Diet full liquid Room service appropriate? Yes; Fluid consistency: Thin  Diet effective now       ?  ? ?  ?  ? ?  ? ? ?EDUCATION NEEDS:  ? ?Education needs have been addressed ? ?Skin:  Skin Assessment: Reviewed RN Assessment ? ?Last BM:  11/22/21 ? ?Height:  ? ?Ht Readings from Last 1 Encounters:  ?11/22/21 5' 6.5" (1.689 m)  ? ? ?Weight:  ? ?Wt Readings from Last 1 Encounters:  ?11/21/21 101.9 kg  ? ? ?Ideal  Body Weight:  60.2 kg ? ?BMI:  Body mass index is 35.72 kg/m?. ? ?Estimated Nutritional Needs:  ? ?Kcal:  1900-2100 ? ?Protein:  105-120 grams ? ?Fluid:  > 1.9 L ? ? ? ?Loistine Chance, RD, LDN, CDCES ?Registered Dietitian II ?Certified Diabetes Care and Education Specialist ?Please refer to [facility] for RD and/or RD on-call/weekend/after hours pager  ?

## 2021-11-25 NOTE — TOC Initial Note (Signed)
Transition of Care (TOC) - Initial/Assessment Note  ? ? ?Patient Details  ?Name: Sarah Sosa ?MRN: [id number] ?Date of Birth: 08-24-1980 ? ?Transition of Care (TOC) CM/SW Contact:    ?Shelbie Hutching, RN ?Phone Number: ?11/25/2021, 4:41 PM ? ?Clinical Narrative:                 ? ?Patient admitted to the hospital with pancreatitis.  Patient was able to tolerate diet today but still having pain requiring IV medications. ?RNCM met with patient at the bedside.  She is moving to [place] from [place], she will be moving in with her father.  Her dad or boyfriend provide transportation.  She knows that she needs to get established with a PCP and GI doctor and pain management.  Provided her with information on [place] patient's can usually get in with them fairly quickly for primary care.  PCP will be able to refer to pain management and GI referral can be placed at discharge. ?Patient reports drinking socially, she would probably like to see a therapist about quitting drinking.  Resource provided. ?No other needs at this time. ? ? ?Expected Discharge Plan: Home/Self Care ?Barriers to Discharge: Continued Medical Work up ? ? ?Patient Goals and CMS Choice ?Patient states their goals for this hospitalization and ongoing recovery are:: Patient reports that she wants to get set up with GI doctor and pain managment after discharge ?  ?  ? ?Expected Discharge Plan and Services ?Expected Discharge Plan: Home/Self Care ?  ?Discharge Planning Services: CM Consult ?  ?Living arrangements for the past 2 months: Apartment ?                ?DME Arranged: N/A ?DME Agency: NA ?  ?  ?  ?HH Arranged: NA ?[place] Agency: NA ?  ?  ?  ? ?Prior Living Arrangements/Services ?Living arrangements for the past 2 months: Apartment ?Lives with:: Self ?Patient language and need for interpreter reviewed:: Yes ?Do you feel safe going back to the place where you live?: Yes      ?Need for Family Participation in Patient Care: Yes  (Comment) ?Care giver support system in place?: Yes (comment) ?  ?Criminal Activity/Legal Involvement Pertinent to Current Situation/Hospitalization: No - Comment as needed ? ?Activities of Daily Living ?Home Assistive Devices/Equipment: None ?ADL Screening (condition at time of admission) ?Patient's cognitive ability adequate to safely complete daily activities?: Yes ?Is the patient deaf or have difficulty hearing?: No ?Does the patient have difficulty seeing, even when wearing glasses/contacts?: No ?Does the patient have difficulty concentrating, remembering, or making decisions?: No ?Patient able to express need for assistance with ADLs?: Yes ?Does the patient have difficulty dressing or bathing?: No ?Independently performs ADLs?: Yes (appropriate for developmental age) ?Does the patient have difficulty walking or climbing stairs?: No ?Weakness of Legs: None ?Weakness of Arms/Hands: None ? ?Permission Sought/Granted ?  ?  ?   ?   ?   ?   ? ?Emotional Assessment ?Appearance:: Appears stated age ?Attitude/Demeanor/Rapport: Engaged ?Affect (typically observed): Accepting ?Orientation: : Oriented to Self, Oriented to Place, Oriented to  Time, Oriented to Situation ?Alcohol / Substance Use: Alcohol Use ?Psych Involvement: No (comment) ? ?Admission diagnosis:  Acute pancreatitis [K85.90] ?Acute abdominal pain [R10.9] ?Alcohol-induced acute pancreatitis without infection or necrosis [K85.20] ?Patient Active Problem List  ? Diagnosis Date Noted  ? Hypomagnesemia 11/23/2021  ? Essential hypertension 11/20/2021  ? GERD without esophagitis 11/20/2021  ? (HFpEF) heart failure with preserved ejection fraction ([place])  11/20/2021  ? Peripheral neuropathy 11/20/2021  ? Asthma, chronic 11/20/2021  ? Acute alcoholic pancreatitis 10/68/1661  ? Alcohol abuse 11/20/2021  ? Acute pancreatitis 11/20/2021  ? Recurrent pancreatitis 06/04/2020  ? Intractable nausea and vomiting   ? Marijuana use   ? Hypertensive urgency 04/03/2020  ?  Hypokalemia 04/03/2020  ? Tobacco abuse 04/03/2020  ? History of substance abuse ([place]) 04/03/2020  ? Ileus ([place]) 04/21/2018  ? Postoperative ileus ([place]) 04/20/2018  ? Postoperative state 04/15/2018  ? Uterine leiomyoma 03/19/2018  ? Menorrhagia with regular cycle 03/19/2018  ? Chronic pain disorder 03/19/2018  ? Pelvic pain 03/19/2018  ? Primary hypertension 03/19/2018  ? ?PCP:  [facility] ?Pharmacy:   ?[place], [place], [place] - [address] ?[address] [place] [address] ?Phone: [phone] Fax: [phone] ? ?[place], [place] ?[address] ?[place] [address] ?Phone: [phone] Fax: [phone] ? ? ? ? ?Social Determinants of Health (SDOH) Interventions ?  ? ?Readmission Risk Interventions ? ?  11/25/2021  ?  4:37 PM 06/04/2020  ? 11:17 AM  ?Readmission Risk Prevention Plan  ?Transportation Screening Complete Complete  ?PCP or Specialist Appt within 3-5 Days  Complete  ?[place] or Home Care Consult  Complete  ?Social Work Consult for [place] Planning/Counseling  Complete  ?Palliative Care Screening  Not Applicable  ?Medication Review Press photographer) Complete Complete  ?PCP or Specialist appointment within 3-5 days of discharge Complete   ?[place] or Home Care Consult Complete   ?SW Recovery Care/Counseling Consult Complete   ?Palliative Care Screening Not Applicable   ?[place] Not Applicable   ? ? ? ?

## 2021-11-25 NOTE — Discharge Instructions (Addendum)
Pancreatitis Nutrition Therapy  ? ?The pancreas helps your body digest and absorb nutrients in food. Pancreatitis prevents the body from digesting food well, especially if the food is high in fat.  This nutrition therapy limits the fat in your diet while providing nutrients you need. ?Your goal is to eat as near to a normal diet as possible without experiencing gastrointestinal (GI) symptoms. These symptoms include stomach pain, bloating, weight loss or difficulty maintaining weight, vomiting, burping, loose stools, and steatorrhea. ?The effects of pancreatitis are different for all individuals, so it is important to work with your registered dietitian nutritionist (RDN) to determine which foods trigger your GI symptoms. Your RDN can also help you figure out your tolerance to fat in foods and how to manage your symptoms with your diet. ? ?Tips ?You may need to take pancreatic enzymes if you have frequent, loose stools after mealtimes. Individuals who take pancreatic enzymes will need to take the prescribed dosage at the start of a meal or snack. ?Individuals who are prescribed a low-fat diet will need to limit fats and oils to no more than 6 teaspoons (30 grams) daily. Up to 1 ounce of avocado can also be substituted for 1 teaspoon of fat. A low-fat diet may not be needed if you are taking pancreatic enzymes. ?Avoid drinking alcohol. ?Keep a bottle of water with you at all times to stay hydrated and ensure you are getting in enough fluid each day. The general recommendation is to drink 8 cups (64 ounces) of fluids daily. ?Eat small, frequent meals (4-6) throughout the day to help you recover from pancreatitis or to maintain your normal body weight. ?Eat more whole fruits and vegetables rather than drinking fruit and vegetable juices.  ?Fiber is found in whole grain foods and slows digestion. You may need to choose whole grain foods less often if you feel full quickly after eating. ?Ask your RDN for recommendations  on managing your diet if you also have other conditions, such as diabetes mellitus. ?Your RDN might recommend a vitamin and mineral supplement or fat-soluble vitamin supplements if you require higher amounts of these nutrients. ? ?Foods Recommended and Foods Not Recommended ?The following list of foods may be helpful if you need to limit your fat intake. ?Food Group Foods Recommended Foods Not Recommended  ?Grains Breads: Bagels, buns, English muffins ?Hot/cold cereals ?Couscous ?Low-fat crackers ?Pancakes ?Pasta ?Popcorn, air popped ?Rice ?Corn or flour tortilla Products made with added fat (such as biscuits, waffles, and regular crackers) ?High-fat bakery products such as doughnuts, biscuits, croissants, Danish pastries, pies, cookies ?Snacks made with partially hydrogenated oils including chips, cheese puffs, snack mixes, regular crackers, butter-flavored popcorn  ?Protein Foods Lean cuts of poultry (without skin) such as chicken or [place] ?Low-fat hamburger (for example, 7% fat) ?Lean cuts of fish (white fish) ?Canned tuna in water ?Egg whites or egg substitute ?Lean deli meats such as [place], chicken, lean beef ?Non-animal protein sources (tofu, legumes, beans, lentils) ?Smooth nut butters ?  ?  Higher-fat cuts of meats such as ribs, T-bone steak, regular hamburger (15% to 20% fat) ?Full-fat processed meats (hot dogs, bologna, salami, sausage, bacon, etc) ?Red meats ?Organ meats (liver, brains, sweetbreads) ?Poultry with skin ?Fried meat, poultry, tofu, and fish ?Whole eggs and egg yolks ?Full-fat refried beans ?Tree nuts and peanuts  ?Dairy and Dairy Alternatives 1% or fat-free dairy (milk, yogurt, cheese, cottage cheese, sour cream) ?Frozen yogurt ?Fortified non-dairy milk (almond, rice, soy, etc.) Creamy/cheesy sauces ?Cream ?Whole-fat or reduced-fat (2%)  dairy (milk, yogurt, ice cream, cheese) ?Milkshakes ?Half-and-half ?Cream cheese ?Sour cream ?Coconut milk  ?Vegetables All fresh, frozen, or canned  vegetables Fried or stir-fried vegetables ?Vegetables prepared with butter, cheese, or cream sauce  ?Fruit All fresh, frozen, or canned fruit Fried fruits ?Fruit served with butter or cream  ?Fats and Oils All vegetable oils ?  Butter, stick margarine, shortening, partially hydrogenated oils, tropical oils (coconut, palm, and palm kernel oils)  ? ?Pancreatitis Sample 1-Day Menu  ?Breakfast 2 egg whites, cooked ?1 whole wheat bagel ?1 tablespoon low-fat cream cheese ?1 cup fat-free milk ?? cup blueberries  ?Lunch 2 slices bread ?2 ounces [place] breast ?2 leaves lettuce ?2 slices tomato ?1 teaspoon mustard ?2 teaspoons nonfat mayonnaise ?1 cup carrots ?? cup pineapple ?1 cup fat-free milk  ?Evening Meal 3 ounces tilapia, baked ?? cup cooked rice ?? cup zucchini ?1 cup lettuce for salad ?2 teaspoons fat-free [place] dressing, for salad ?1 dinner roll ?1 teaspoon margarine  ?Evening Snack ? cup low-fat yogurt ?1 cup strawberries ?1 ounce pretzels  ?  ? ?Pancreatitis Vegan Sample 1-Day Menu  ?Breakfast 1 cup cooked oatmeal ?? cup almonds ?1 apple ?1 cup soymilk fortified with calcium, vitamin B12, and vitamin D  ?Lunch 1 cup kidney beans ?? cup corn ?2 cups lettuce ?2 slices tomato ?1 cup carrots ?1 tablespoon low-fat [place] dressing ?1 cup soymilk fortified with calcium, vitamin B12, and vitamin D  ?Afternoon Snack 1 orange ?1 cup soymilk fortified with calcium and vitamin D, vitamin B12, and vitamin D  ?Evening Meal ? cup meatless meatballs ?2 slices polenta ?? cup tomato sauce ?? cup mushrooms ?? cup zucchini ?1 teaspoon olive oil ?1 whole wheat roll ?1 cup strawberries  ?  ? ?Pancreatitis Vegetarian (Lacto-Ovo) Sample 1-Day Menu  ?Breakfast 1 cup cooked oatmeal ?? cup almonds ?1 cup blueberries ?1 cup fat-free milk  ?Lunch 1 cup kidney beans ?2 cups lettuce ?2 slices tomato ?1 cup carrots ?1 tablespoon low-fat [place] dressing ?1 slice whole wheat bread ?? cup pineapple ?1 cup fat-free milk  ?Afternoon Snack 1  apple ?1 cup fat-free milk  ?Evening Meal ? cup meatless chicken ?2 slices polenta ?? cup tomato sauce ?? cup mushrooms ?? cup zucchini ?1 whole wheat roll ?1 teaspoon margarine, soft, tub ?? cup fat-free frozen yogurt ?? cup strawberries  ? ?  ? ?

## 2021-11-25 NOTE — Progress Notes (Addendum)
?  Progress Note ? ? ?Patient: Sarah Sosa HFW:[id number] DOB: May 20, 1981 DOA: 11/20/2021     5 ?DOS: the patient was seen and examined on 11/25/2021 ?  ? ?Assessment and Plan: ?* Acute alcoholic pancreatitis ?Patient still having pain 8 out of 10 in intensity went from clear liquid diet to full liquid diet but then there was nothing on the full liquid diet that she wanted to have and then went to regular diet. ?Continue short acting oral pain medications and continue IV Dilaudid.  Increased pancreatic enzymes. ? ?Alcohol abuse ?Currently no signs of withdrawal.  Continue thiamine folic acid and multivitamin. ? ?Hypomagnesemia ?Replaced yesterday into the normal range. ? ?Hypokalemia ?Replace potassium and IV fluids. ? ?(HFpEF) heart failure with preserved ejection fraction ([place]) ?Continue Coreg and Zestril and hold off Lasix for now. No signs of heart failure currently. ? ?GERD without esophagitis ?Continue PPI therapy and Carafate. ? ?Essential hypertension ?Continue amlodipine, Coreg and Zestril ? ?Peripheral neuropathy ?Continue Lyrica. ? ?Asthma, chronic ?Continue Singulair, ProAir and Symbicort. ? ? ? ? ?  ? ?Subjective: Patient feeling a little bit better but still has pain 7 out of 10 in intensity.  Tolerated liquid diet and this morning wanted to go up to full liquid diet.  Nursing staff let me know that she did not like anything on the full liquid diet and wanted to advance directly to solid food.  Admitted with acute pancreatitis ? ?Physical Exam: ?Vitals:  ? 11/25/21 0034 11/25/21 0449 11/25/21 0809 11/25/21 1223  ?BP: (!) 122/59 113/73 121/84 (!) 160/88  ?Pulse: 64 71 69 60  ?Resp: '18 18 16 17  '$ ?Temp: 98.2 ?F (36.8 ?C) 97.8 ?F (36.6 ?C) 98.2 ?F (36.8 ?C) 97.8 ?F (36.6 ?C)  ?TempSrc: Oral Oral Oral Oral  ?SpO2: 93% 97% 98% 99%  ?Weight:      ?Height:      ? ?Physical Exam ?HENT:  ?   Head: Normocephalic.  ?   Mouth/Throat:  ?   Pharynx: No oropharyngeal exudate.  ?Eyes:  ?   General: Lids are normal.  ?    Conjunctiva/sclera: Conjunctivae normal.  ?Cardiovascular:  ?   Rate and Rhythm: Normal rate and regular rhythm.  ?   Heart sounds: S1 normal and S2 normal. Murmur heard.  ?Systolic murmur is present with a grade of 2/6.  ?Pulmonary:  ?   Breath sounds: Normal breath sounds. No decreased breath sounds, wheezing, rhonchi or rales.  ?Abdominal:  ?   Palpations: Abdomen is soft.  ?   Tenderness: There is abdominal tenderness in the epigastric area and left upper quadrant.  ?Musculoskeletal:  ?   Right lower leg: No swelling.  ?   Left lower leg: No swelling.  ?Skin: ?   General: Skin is warm.  ?   Findings: No rash.  ?Neurological:  ?   Mental Status: She is alert and oriented to person, place, and time.  ?  ?Data Reviewed: ?White blood cell count 3.9, hemoglobin 11.8 and platelet count 204 ? ?Disposition: ?Status is: Inpatient ?Remains inpatient appropriate because: Trying to advance diet and treatment for acute pancreatitis with IV fluids.  Still requiring IV pain medications. ?Planned Discharge Destination: Home ? ?Author: ?Loletha Grayer, MD ?11/25/2021 2:25 PM ? ?For on call review [email or web address]  ?

## 2021-11-26 DIAGNOSIS — K852 Alcohol induced acute pancreatitis without necrosis or infection: Secondary | ICD-10-CM | POA: Diagnosis not present

## 2021-11-26 DIAGNOSIS — F1010 Alcohol abuse, uncomplicated: Secondary | ICD-10-CM | POA: Diagnosis not present

## 2021-11-26 DIAGNOSIS — E876 Hypokalemia: Secondary | ICD-10-CM | POA: Diagnosis not present

## 2021-11-26 LAB — COMPREHENSIVE METABOLIC PANEL
ALT: 9 U/L (ref 0–44)
AST: 14 U/L — ABNORMAL LOW (ref 15–41)
Albumin: 2.9 g/dL — ABNORMAL LOW (ref 3.5–5.0)
Alkaline Phosphatase: 44 U/L (ref 38–126)
Anion gap: 4 — ABNORMAL LOW (ref 5–15)
BUN: 12 mg/dL (ref 6–20)
CO2: 28 mmol/L (ref 22–32)
Calcium: 9.1 mg/dL (ref 8.9–10.3)
Chloride: 107 mmol/L (ref 98–111)
Creatinine, Ser: 0.9 mg/dL (ref 0.44–1.00)
GFR, Estimated: >60 mL/min (ref >60)
Glucose, Bld: 117 mg/dL — ABNORMAL HIGH (ref 70–99)
Potassium: 4.2 mmol/L (ref 3.5–5.1)
Sodium: 139 mmol/L (ref 135–145)
Total Bilirubin: 0.3 mg/dL (ref 0.3–1.2)
Total Protein: 5.8 g/dL — ABNORMAL LOW (ref 6.5–8.1)

## 2021-11-26 LAB — CBC
HCT: 35.9 % — ABNORMAL LOW (ref 36.0–46.0)
Hemoglobin: 11.7 g/dL — ABNORMAL LOW (ref 12.0–15.0)
MCH: 31 pg (ref 26.0–34.0)
MCHC: 32.6 g/dL (ref 30.0–36.0)
MCV: 95.2 fL (ref 80.0–100.0)
Platelets: 199 10*3/uL (ref 150–400)
RBC: 3.77 MIL/uL — ABNORMAL LOW (ref 3.87–5.11)
RDW: 12.9 % (ref 11.5–15.5)
WBC: 4.7 10*3/uL (ref 4.0–10.5)
nRBC: 0 % (ref 0.0–0.2)

## 2021-11-26 LAB — MAGNESIUM: Magnesium: 1.4 mg/dL — ABNORMAL LOW (ref 1.7–2.4)

## 2021-11-26 LAB — LIPASE, BLOOD: Lipase: 32 U/L (ref 11–51)

## 2021-11-26 LAB — PHOSPHORUS: Phosphorus: 4.3 mg/dL (ref 2.5–4.6)

## 2021-11-26 MED ORDER — HYDROMORPHONE HCL 1 MG/ML IJ SOLN
0.5000 mg | INTRAMUSCULAR | Status: DC | PRN
  Administered 2021-11-26 – 2021-11-27 (×5): 0.5 mg via INTRAVENOUS
  Filled 2021-11-26 (×5): qty 1

## 2021-11-26 MED ORDER — ENSURE ENLIVE PO LIQD: 237.0000 mL | Freq: Two times a day (BID) | ORAL | 12 refills | Status: DC

## 2021-11-26 MED ORDER — PANCRELIPASE (LIP-PROT-AMYL) 36000-114000 UNITS PO CPEP: 72000.0000 [IU] | ORAL_CAPSULE | Freq: Three times a day (TID) | ORAL | 0 refills | Status: DC

## 2021-11-26 MED ORDER — ADULT MULTIVITAMIN W/MINERALS CH: 1.0000 | ORAL_TABLET | Freq: Every day | ORAL | 0 refills | Status: DC

## 2021-11-26 MED ORDER — THIAMINE HCL 100 MG PO TABS: 100.0000 mg | ORAL_TABLET | Freq: Every day | ORAL | 0 refills | Status: DC

## 2021-11-26 MED ORDER — OXYCODONE HCL 10 MG PO TABS: 10.0000 mg | ORAL_TABLET | Freq: Four times a day (QID) | ORAL | 0 refills | Status: DC | PRN

## 2021-11-26 MED ORDER — FOLIC ACID 1 MG PO TABS: 1.0000 mg | ORAL_TABLET | Freq: Every day | ORAL | 0 refills | Status: DC

## 2021-11-26 MED ORDER — MAGNESIUM SULFATE 4 GM/100ML IV SOLN
4.0000 g | Freq: Once | INTRAVENOUS | Status: AC
  Administered 2021-11-26: 4 g via INTRAVENOUS
  Filled 2021-11-26: qty 100

## 2021-11-26 NOTE — Progress Notes (Signed)
?  Progress Note ? ? ?Patient: Sarah Sosa JSE:[id number] DOB: 02/01/1981 DOA: 11/20/2021     6 ?DOS: the patient was seen and examined on 11/26/2021 ?  ? ?Assessment and Plan: ?* Acute alcoholic pancreatitis ?Did not do well with lunch today.  Needed to wait until pancreatic enzymes kicked in before she can eat.  Still having 7 out of 10 in intensity.  Will reassess tomorrow for disposition.  Continue increased dose of pancreatic enzymes.  Taper Dilaudid.  Continue oral pain medications. ? ?Alcohol abuse ?Currently no signs of withdrawal.  Continue thiamine folic acid and multivitamin. ? ?Hypomagnesemia ?Replace 4 g IV today. ? ?Hypokalemia ?Replaced into the normal range. ? ?(HFpEF) heart failure with preserved ejection fraction ([place]) ?Continue Coreg and Zestril and hold off Lasix for now. No signs of heart failure currently. ? ?GERD without esophagitis ?Continue PPI therapy and Carafate. ? ?Essential hypertension ?Continue amlodipine, Coreg and Zestril ? ?Peripheral neuropathy ?Continue Lyrica. ? ?Asthma, chronic ?Continue Singulair, ProAir and Symbicort. ? ? ? ? ?  ? ?Subjective: Patient stated with lunch today that she did have more pain and needed to wait until the pancreatic enzymes kicked in and needed some IV pain medications.  Overall feeling better than when she came in.  Pain is still 7 out of 10 in intensity.  Being treated for acute pancreatitis ? ?Physical Exam: ?Vitals:  ? 11/26/21 0031 11/26/21 0532 11/26/21 0737 11/26/21 1243  ?BP: 131/66 123/70 127/85 121/62  ?Pulse: 61 74 66 (!) 55  ?Resp: '18 18 16 16  '$ ?Temp: 98.2 ?F (36.8 ?C) 98.5 ?F (36.9 ?C) 98.2 ?F (36.8 ?C) 98.1 ?F (36.7 ?C)  ?TempSrc: Oral  Oral Oral  ?SpO2: 96% 96% 97% 99%  ?Weight:      ?Height:      ? ?Physical Exam ?HENT:  ?   Head: Normocephalic.  ?   Mouth/Throat:  ?   Pharynx: No oropharyngeal exudate.  ?Eyes:  ?   General: Lids are normal.  ?   Conjunctiva/sclera: Conjunctivae normal.  ?Cardiovascular:  ?   Rate and Rhythm: Normal rate  and regular rhythm.  ?   Heart sounds: S1 normal and S2 normal. Murmur heard.  ?Systolic murmur is present with a grade of 2/6.  ?Pulmonary:  ?   Breath sounds: Normal breath sounds. No decreased breath sounds, wheezing, rhonchi or rales.  ?Abdominal:  ?   Palpations: Abdomen is soft.  ?   Tenderness: There is abdominal tenderness in the epigastric area and left upper quadrant.  ?Musculoskeletal:  ?   Right lower leg: No swelling.  ?   Left lower leg: No swelling.  ?Skin: ?   General: Skin is warm.  ?   Findings: No rash.  ?Neurological:  ?   Mental Status: She is alert and oriented to person, place, and time.  ?  ?Data Reviewed: ?Lipase 32, magnesium 1.4, potassium 4.2 ? ?Family Communication: Family at bedside ? ?Disposition: ?Status is: Inpatient ?Remains inpatient appropriate because: Did not do as well with lunch and had more pain.  Reevaluate tomorrow for disposition. ?Planned Discharge Destination: Home ? ? ?Author: ?Loletha Grayer, MD ?11/26/2021 3:16 PM ? ?For on call review [email or web address].  ?

## 2021-11-27 DIAGNOSIS — K852 Alcohol induced acute pancreatitis without necrosis or infection: Secondary | ICD-10-CM | POA: Diagnosis not present

## 2021-11-27 DIAGNOSIS — F1010 Alcohol abuse, uncomplicated: Secondary | ICD-10-CM | POA: Diagnosis not present

## 2021-11-27 DIAGNOSIS — E876 Hypokalemia: Secondary | ICD-10-CM | POA: Diagnosis not present

## 2021-11-27 LAB — POTASSIUM: Potassium: 3.8 mmol/L (ref 3.5–5.1)

## 2021-11-27 LAB — MAGNESIUM: Magnesium: 1.6 mg/dL — ABNORMAL LOW (ref 1.7–2.4)

## 2021-11-27 MED ORDER — FOLIC ACID 1 MG PO TABS: 1.0000 mg | ORAL_TABLET | Freq: Every day | ORAL | 0 refills | Status: DC

## 2021-11-27 MED ORDER — PANCRELIPASE (LIP-PROT-AMYL) 36000-114000 UNITS PO CPEP
72000.0000 [IU] | ORAL_CAPSULE | Freq: Three times a day (TID) | ORAL | 0 refills | Status: DC
Start: 2021-11-27 — End: 2023-03-21

## 2021-11-27 MED ORDER — ENSURE ENLIVE PO LIQD: 237.0000 mL | Freq: Two times a day (BID) | ORAL | 0 refills | Status: DC

## 2021-11-27 MED ORDER — MAGNESIUM SULFATE 4 GM/100ML IV SOLN
4.0000 g | Freq: Once | INTRAVENOUS | Status: AC
Start: 2021-11-27 — End: 2021-11-27
  Administered 2021-11-27: 4 g via INTRAVENOUS
  Filled 2021-11-27: qty 100

## 2021-11-27 MED ORDER — MAGNESIUM OXIDE 400 MG PO CAPS
400.0000 mg | ORAL_CAPSULE | Freq: Every day | ORAL | 0 refills | Status: DC
Start: 2021-11-27 — End: 2023-07-13

## 2021-11-27 MED ORDER — THIAMINE HCL 100 MG PO TABS: 100.0000 mg | ORAL_TABLET | Freq: Every day | ORAL | 0 refills | Status: DC

## 2021-11-27 MED ORDER — OXYCODONE HCL 10 MG PO TABS
10.0000 mg | ORAL_TABLET | Freq: Four times a day (QID) | ORAL | 0 refills | Status: DC | PRN
Start: 2021-11-27 — End: 2022-02-09

## 2021-11-27 MED ORDER — POTASSIUM CHLORIDE CRYS ER 10 MEQ PO TBCR: 10.0000 meq | EXTENDED_RELEASE_TABLET | Freq: Every day | ORAL | 0 refills | Status: DC

## 2021-11-27 NOTE — Discharge Summary (Signed)
?Physician Discharge Summary ?  ?Patient: Sarah Sosa MRN: [id number] DOB: October 06, 1980  ?Admit date:     11/20/2021  ?Discharge date: 11/27/21  ?Discharge Physician: Loletha Grayer  ? ?PCP: [facility]  ? ?Recommendations at discharge:  ? ?Follow-up with doctor on your Medicaid card 5 days ? ?Discharge Diagnoses: ?Principal Problem: ?  Acute alcoholic pancreatitis ?Active Problems: ?  Alcohol abuse ?  Hypokalemia ?  Hypomagnesemia ?  Essential hypertension ?  GERD without esophagitis ?  (HFpEF) heart failure with preserved ejection fraction ([place]) ?  Peripheral neuropathy ?  Asthma, chronic ? ? ?Hospital Course: ?Sarah Sosa is a 41 y.o. African-American female with medical history significant for asthma, GERD, chronic pain, hypertension and pancreatitis, who presented to the emergency room with acute onset of epigastric and left upper quadrant abdominal pain with associated nausea and vomiting.   Found to have pancreatitis due to alcohol use.   ? ?The patient was admitted to the hospital on 11/20/2021 and discharged on 11/27/2021.  The patient did complain of a lot of pain during the hospitalization.  She requested IV Dilaudid to be given to help out with her pain.  Her lipase was 188 on presentation and came down to 32 on 11/26/2021.  The patient tolerated solid food and was started on pancreatic enzymes to help out with digestion.  The patient was discharged home in stable condition with a small prescription of oral pain medications.  Tylenol after pain medications for now. ? ?During the hospital course we replace potassium and also magnesium.  Magnesium and potassium will be replaced upon disposition. ? ?Assessment and Plan: ?* Acute alcoholic pancreatitis ?Patient tolerated diet yesterday and today.  Continue pancreatic enzymes prior to meals.  Small prescription of continue oral pain medications prescribed upon discharge. ? ?Alcohol abuse ?No signs of withdrawal.  Continue thiamine folic acid and  multivitamin.  Patient must stop drinking or else will end up in the hospital more often. ? ?Hypomagnesemia ?Replaced again IV today on a magnesium of 1.6.  We will also give oral supplementation upon discharge. ? ?Hypokalemia ?Replaced into the normal range.  Will give low-dose potassium upon discharge. ? ?(HFpEF) heart failure with preserved ejection fraction ([place]) ?Continue Coreg and Diovan as outpatient.  Hold off on spironolactone at this point.  Lasix as needed.  No signs of heart failure during the hospital stay despite giving quite a bit of IV fluids ? ?GERD without esophagitis ?Continue PPI therapy and Carafate. ? ?Essential hypertension ?Continue amlodipine, Coreg and can go back on Diovan as outpatient ? ?Peripheral neuropathy ?Continue Lyrica. ? ?Asthma, chronic ?Continue Singulair, ProAir and Symbicort. ? ? ? ? ?  ? ? ?Consultants: None ?Procedures performed: None ?Disposition: Home ?Diet recommendation:  ?Cardiac diet ?DISCHARGE MEDICATION: ?Allergies as of 11/27/2021   ? ?   Reactions  ? Latex Anaphylaxis, Hives, Itching  ? Other reaction(s): Other (See Comments) ?SKIN BURNING & PEELING  ? Nsaids Other (See Comments)  ? Serve ulcers; stomach aches, GERD.   ? Tomato Anaphylaxis, Hives, Itching  ? Tramadol Itching  ? ?  ? ?  ?Medication List  ?  ? ?STOP taking these medications   ? ?DULoxetine 30 MG capsule ?Commonly known as: CYMBALTA ?  ?montelukast 10 MG tablet ?Commonly known as: SINGULAIR ?  ?naloxone 4 MG/0.1ML Liqd nasal spray kit ?Commonly known as: NARCAN ?  ?nicotine 14 mg/24hr patch ?Commonly known as: NICODERM CQ - dosed in mg/24 hours ?  ?oxyCODONE-acetaminophen 5-325 MG tablet ?Commonly known  as: Percocet ?  ?penicillin v potassium 500 MG tablet ?Commonly known as: VEETID ?  ?ProAir HFA 108 (90 Base) MCG/ACT inhaler ?Generic drug: albuterol ?  ?spironolactone 50 MG tablet ?Commonly known as: ALDACTONE ?  ?Symbicort 160-4.5 MCG/ACT inhaler ?Generic drug: budesonide-formoterol ?  ? ?  ? ?TAKE  these medications   ? ?amLODipine 10 MG tablet ?Commonly known as: NORVASC ?Take 1 tablet (10 mg total) by mouth daily. Blood pressure medication. ?  ?carvedilol 25 MG tablet ?Commonly known as: COREG ?Take 1 tablet by mouth in the morning and at bedtime. ?  ?chlorhexidine 0.12 % solution ?Commonly known as: PERIDEX ?Use as directed 15 mLs in the mouth or throat 2 (two) times daily. ?  ?feeding supplement Liqd ?Take 237 mLs by mouth 2 (two) times daily between meals. ?  ?folic acid 1 MG tablet ?Commonly known as: FOLVITE ?Take 1 tablet (1 mg total) by mouth daily. ?  ?furosemide 40 MG tablet ?Commonly known as: LASIX ?Take 40-80 mg by mouth daily as needed. ?  ?lipase/protease/amylase 36000 UNITS Cpep capsule ?Commonly known as: CREON ?Take 2 capsules (72,000 Units total) by mouth 3 (three) times daily before meals. 1 capsule with snacks ?  ?Magnesium Oxide 400 MG Caps ?Take 1 capsule (400 mg total) by mouth daily. ?  ?melatonin 3 MG Tabs tablet ?Take 3 mg by mouth at bedtime. ?  ?multivitamin with minerals Tabs tablet ?Take 1 tablet by mouth daily. ?  ?Oxycodone HCl 10 MG Tabs ?Take 1 tablet (10 mg total) by mouth every 6 (six) hours as needed. ?  ?pantoprazole 40 MG tablet ?Commonly known as: Protonix ?Take 1 tablet (40 mg total) by mouth daily. ?  ?potassium chloride 10 MEQ tablet ?Commonly known as: KLOR-CON M ?Take 1 tablet (10 mEq total) by mouth daily. ?  ?pregabalin 75 MG capsule ?Commonly known as: LYRICA ?Take 75 mg by mouth 2 (two) times daily. ?  ?sucralfate 1 g tablet ?Commonly known as: Carafate ?Take 1 tablet (1 g total) by mouth 4 (four) times daily. ?  ?thiamine 100 MG tablet ?Take 1 tablet (100 mg total) by mouth daily. ?  ?traZODone 50 MG tablet ?Commonly known as: DESYREL ?Take 50 mg by mouth at bedtime. ?  ?valsartan 320 MG tablet ?Commonly known as: DIOVAN ?Take 320 mg by mouth daily. ?  ? ?  ? ? Follow-up Information   ? ? doctor on your medicaid card Follow up in 2 week(s).   ? ?  ?  ? ?  ?   ? ?  ? ?Discharge Exam: ?Filed Weights  ? 11/20/21 1758 11/21/21 0040  ?Weight: 104.8 kg 101.9 kg  ? ?Physical Exam ?HENT:  ?   Head: Normocephalic.  ?   Mouth/Throat:  ?   Pharynx: No oropharyngeal exudate.  ?Eyes:  ?   General: Lids are normal.  ?   Conjunctiva/sclera: Conjunctivae normal.  ?Cardiovascular:  ?   Rate and Rhythm: Normal rate and regular rhythm.  ?   Heart sounds: S1 normal and S2 normal. Murmur heard.  ?Systolic murmur is present with a grade of 2/6.  ?Pulmonary:  ?   Breath sounds: Normal breath sounds. No decreased breath sounds, wheezing, rhonchi or rales.  ?Abdominal:  ?   Palpations: Abdomen is soft.  ?   Tenderness: There is abdominal tenderness in the epigastric area and left upper quadrant.  ?Musculoskeletal:  ?   Right lower leg: No swelling.  ?   Left lower leg: No swelling.  ?  Skin: ?   General: Skin is warm.  ?   Findings: No rash.  ?Neurological:  ?   Mental Status: She is alert and oriented to person, place, and time.  ?  ? ?Condition at discharge: stable ? ?The results of significant diagnostics from this hospitalization (including imaging, microbiology, ancillary and laboratory) are listed below for reference.  ? ?Imaging Studies: ?CT ABDOMEN PELVIS W CONTRAST ? ?Addendum Date: 11/23/2021   ?ADDENDUM REPORT: 11/23/2021 18:59 ADDENDUM: To be noted that upon further evaluation, the gallbladder is surgically absent. Electronically Signed   By: Virgina Norfolk M.D.   On: 11/23/2021 18:59  ? ?Result Date: 11/23/2021 ?CLINICAL DATA:  Right upper quadrant pain. EXAM: CT ABDOMEN AND PELVIS WITH CONTRAST TECHNIQUE: Multidetector CT imaging of the abdomen and pelvis was performed using the standard protocol following bolus administration of intravenous contrast. RADIATION DOSE REDUCTION: This exam was performed according to the departmental dose-optimization program which includes automated exposure control, adjustment of the mA and/or kV according to patient size and/or use of iterative  reconstruction technique. CONTRAST:  142m OMNIPAQUE IOHEXOL 300 MG/ML  SOLN COMPARISON:  October 30, 2021 FINDINGS: Lower chest: No acute abnormality. Hepatobiliary: No focal liver abnormality is seen. No galls

## 2022-01-30 ENCOUNTER — Emergency Department
Admission: EM | Admit: 2022-01-30 | Discharge: 2022-01-30 | Disposition: A | Discharge status: Home / Self Care | Payer: Medicaid Other | Attending: Emergency Medicine | Admitting: Emergency Medicine

## 2022-01-30 ENCOUNTER — Emergency Department: Payer: Medicaid Other

## 2022-01-30 ENCOUNTER — Other Ambulatory Visit: Payer: Self-pay

## 2022-01-30 DIAGNOSIS — N83201 Unspecified ovarian cyst, right side: Secondary | ICD-10-CM | POA: Insufficient documentation

## 2022-01-30 DIAGNOSIS — R102 Pelvic and perineal pain: Secondary | ICD-10-CM | POA: Diagnosis not present

## 2022-01-30 DIAGNOSIS — K047 Periapical abscess without sinus: Secondary | ICD-10-CM | POA: Insufficient documentation

## 2022-01-30 DIAGNOSIS — K0889 Other specified disorders of teeth and supporting structures: Secondary | ICD-10-CM | POA: Diagnosis present

## 2022-01-30 LAB — COMPREHENSIVE METABOLIC PANEL
ALT: 12 U/L (ref 0–44)
AST: 14 U/L — ABNORMAL LOW (ref 15–41)
Albumin: 3.6 g/dL (ref 3.5–5.0)
Alkaline Phosphatase: 52 U/L (ref 38–126)
Anion gap: 7 (ref 5–15)
BUN: 13 mg/dL (ref 6–20)
CO2: 22 mmol/L (ref 22–32)
Calcium: 9.1 mg/dL (ref 8.9–10.3)
Chloride: 110 mmol/L (ref 98–111)
Creatinine, Ser: 0.75 mg/dL (ref 0.44–1.00)
GFR, Estimated: >60 mL/min (ref >60)
Glucose, Bld: 109 mg/dL — ABNORMAL HIGH (ref 70–99)
Potassium: 3.6 mmol/L (ref 3.5–5.1)
Sodium: 139 mmol/L (ref 135–145)
Total Bilirubin: 0.4 mg/dL (ref 0.3–1.2)
Total Protein: 7.1 g/dL (ref 6.5–8.1)

## 2022-01-30 LAB — CBC
HCT: 39 % (ref 36.0–46.0)
Hemoglobin: 12.7 g/dL (ref 12.0–15.0)
MCH: 30.3 pg (ref 26.0–34.0)
MCHC: 32.6 g/dL (ref 30.0–36.0)
MCV: 93.1 fL (ref 80.0–100.0)
Platelets: 237 10*3/uL (ref 150–400)
RBC: 4.19 MIL/uL (ref 3.87–5.11)
RDW: 12.5 % (ref 11.5–15.5)
WBC: 6.1 10*3/uL (ref 4.0–10.5)
nRBC: 0 % (ref 0.0–0.2)

## 2022-01-30 LAB — URINALYSIS, ROUTINE W REFLEX MICROSCOPIC
Bilirubin Urine: NEGATIVE
Glucose, UA: NEGATIVE mg/dL
Hgb urine dipstick: NEGATIVE
Ketones, ur: NEGATIVE mg/dL
Leukocytes,Ua: NEGATIVE
Nitrite: NEGATIVE
Protein, ur: NEGATIVE mg/dL
Specific Gravity, Urine: 1.012 (ref 1.005–1.030)
pH: 5 (ref 5.0–8.0)

## 2022-01-30 LAB — LIPASE, BLOOD: Lipase: 23 U/L (ref 11–51)

## 2022-01-30 MED ORDER — OXYCODONE-ACETAMINOPHEN 5-325 MG PO TABS
1.0000 | ORAL_TABLET | Freq: Once | ORAL | Status: AC
  Administered 2022-01-30: 1 via ORAL
  Filled 2022-01-30: qty 1

## 2022-01-30 MED ORDER — KETOROLAC TROMETHAMINE 15 MG/ML IJ SOLN
15.0000 mg | Freq: Once | INTRAMUSCULAR | Status: AC
  Administered 2022-01-30: 15 mg via INTRAMUSCULAR
  Filled 2022-01-30: qty 1

## 2022-01-30 MED ORDER — AMOXICILLIN 875 MG PO TABS
875.0000 mg | ORAL_TABLET | Freq: Two times a day (BID) | ORAL | 0 refills | Status: DC
Start: 2022-01-30 — End: 2023-03-21

## 2022-01-30 MED ORDER — AMOXICILLIN-POT CLAVULANATE 875-125 MG PO TABS
1.0000 | ORAL_TABLET | Freq: Once | ORAL | Status: AC
  Administered 2022-01-30: 1 via ORAL
  Filled 2022-01-30: qty 1

## 2022-01-30 MED ORDER — HYDROCODONE-ACETAMINOPHEN 5-325 MG PO TABS: 1.0000 | ORAL_TABLET | ORAL | 0 refills | Status: DC | PRN

## 2022-01-30 NOTE — ED Provider Notes (Signed)
[facility] Provider Note  Patient Contact: 5:12 PM (approximate)   History   Dental Pain and Abdominal Pain   HPI  Sarah Sosa is a 41 y.o. female who presents the emergency department complaining of left-sided dental pain, right pelvic pain.  Patient was that she may have a dental infection going on she said pain for 2 to 3 days with swelling of her left cheek.  Pain is mostly along the lower dentition.  Patient is also complaining of some right lower quadrant pain for few days.  Patient states that she has "multiple cysts" on both of her kidneys and believes that 1 is ruptured.  She is more concerned about her dental pain that her abdominal pain at this time.  No fevers or chills, difficulty breathing or swallowing.  Patient has had no vomiting, diarrhea, dysuria, polyuria, vaginal bleeding or discharge.     Physical Exam   Triage Vital Signs: ED Triage Vitals  Enc Vitals Group     BP 01/30/22 1349 (!) 157/119     Pulse Rate 01/30/22 1349 89     Resp 01/30/22 1349 18     Temp 01/30/22 1349 99.1 F (37.3 C)     Temp Source 01/30/22 1349 Oral     SpO2 01/30/22 1349 97 %     Weight 01/30/22 1342 226 lb (102.5 kg)     Height 01/30/22 1342 '5\' 6"'$  (1.676 m)     Head Circumference --      Peak Flow --      Pain Score 01/30/22 1342 9     Pain Loc --      Pain Edu? --      Excl. in [place]? --     Most recent vital signs: Vitals:   01/30/22 1701 01/30/22 1754  BP: (!) 150/100 (!) 148/90  Pulse: 80 80  Resp: 18 18  Temp:  98.9 F (37.2 C)  SpO2: 97% 97%     General: Alert and in no acute distress. ENT:      Ears:       Nose: No congestion/rhinnorhea.      Mouth/Throat: Mucous membranes are moist.  Poor dentition throughout.  No obvious abscess requiring incision and drainage in the oral cavity.  Patient is tender along the left mandible without palpable abnormality concerning for deep space infection.  No sublingual or submandibular tenderness.   Uvula is midline. Neck: No stridor. No cervical spine tenderness to palpation.  No erythema or edema of the anterior neck.  No tenderness along the anterior neck. Hematological/Lymphatic/Immunilogical: No cervical lymphadenopathy. Cardiovascular:  Good peripheral perfusion Respiratory: Normal respiratory effort without tachypnea or retractions. Lungs CTAB.  Gastrointestinal: Bowel sounds 4 quadrants.  Soft to palpation all quadrants.  Patient is tender in the right suprapubic/pelvic region.  No frank right lower quadrant tenderness. No guarding or rigidity. No palpable masses. No distention. No CVA tenderness. Musculoskeletal: Full range of motion to all extremities.  Neurologic:  No gross focal neurologic deficits are appreciated.  Skin:   No rash noted Other:   ED Results / Procedures / Treatments   Labs (all labs ordered are listed, but only abnormal results are displayed) Labs Reviewed  COMPREHENSIVE METABOLIC PANEL - Abnormal; Notable for the following components:      Result Value   Glucose, Bld 109 (*)    AST 14 (*)    All other components within normal limits  URINALYSIS, ROUTINE W REFLEX MICROSCOPIC - Abnormal; Notable for the  following components:   Color, Urine YELLOW (*)    APPearance CLOUDY (*)    All other components within normal limits  LIPASE, BLOOD  CBC     EKG     RADIOLOGY  I personally viewed, evaluated, and interpreted these images as part of my medical decision making, as well as reviewing the written report by the radiologist.  ED Provider Interpretation: Findings consistent with ovarian cyst noted in the right pelvic region.  Trace amount of fluid consistent with likely ruptured hemorrhagic cyst.  US PELVIC COMPLETE W TRANSVAGINAL AND TORSION R/O  Result Date: 01/30/2022 CLINICAL DATA:  Pelvic pain, hysterectomy EXAM: TRANSABDOMINAL AND TRANSVAGINAL ULTRASOUND OF PELVIS DOPPLER ULTRASOUND OF OVARIES TECHNIQUE: Both transabdominal and transvaginal  ultrasound examinations of the pelvis were performed. Transabdominal technique was performed for global imaging of the pelvis including uterus, ovaries, adnexal regions, and pelvic cul-de-sac. It was necessary to proceed with endovaginal exam following the transabdominal exam to visualize the adnexal structures. Color and duplex Doppler ultrasound was utilized to evaluate blood flow to the ovaries. COMPARISON:  11/20/2021 FINDINGS: Uterus Surgically absent. Right ovary Measurements: 3.6 x 2.6 by 3.1 cm = volume: 15.2 mL. Normal follicles within the right ovary, largest measuring 1.8 cm. No adnexal masses. Left ovary Measurements: 3.7 x 2.5 by 4.1 cm = volume: 19.8 mL. There are 2 complex cysts within the left ovary, measuring 2.1 x 1.8 x 1.9 cm in 2.3 x 1.5 x 1.8 cm. Both cysts demonstrate mural thickening and central increased echotexture, with increased peripheral vascularity. Favor ruptured follicles or hemorrhagic cysts. No other adnexal masses. Pulsed Doppler evaluation of both ovaries demonstrates normal low-resistance arterial and venous waveforms. Other findings There is a small to moderate amount of free fluid within the lower pelvis, simple in appearance. IMPRESSION: 1. Complex hypervascular cysts within the left ovary, likely hemorrhagic cysts or ruptured follicles. Short-interval follow up ultrasound in 6-12 weeks is recommended, preferably during the week following the patient's normal menses. 2. Small to moderate simple appearing free fluid within the pelvis, likely physiologic free fluid. 3. Hysterectomy. Electronically Signed   By: Randa Ngo M.D.   On: 01/30/2022 21:22    PROCEDURES:  Critical Care performed: No  Procedures   MEDICATIONS ORDERED IN ED: Medications  amoxicillin-clavulanate (AUGMENTIN) 875-125 MG per tablet 1 tablet (has no administration in time range)  ketorolac (TORADOL) 15 MG/ML injection 15 mg (15 mg Intramuscular Given 01/30/22 1838)  oxyCODONE-acetaminophen  (PERCOCET/ROXICET) 5-325 MG per tablet 1 tablet (1 tablet Oral Given 01/30/22 1935)     IMPRESSION / MDM / ASSESSMENT AND PLAN / ED COURSE  I reviewed the triage vital signs and the nursing notes.                              Differential diagnosis includes, but is not limited to, dental infection, dental abscess, sialoadenitis, Ludwick's, appendicitis, colitis, UTI, nephrolithiasis, ovarian cyst, ovarian torsion  Patient's presentation is most consistent with acute presentation with potential threat to life or bodily function.   Patient's diagnosis is consistent with dental infection, ovarian cyst.  Patient presents to the emergency department 2 complaints, she has had left dental pain and right pelvic pain.  Findings on physical exam are consistent with dental infection.  Given the pelvic pain with a history of ovarian cyst patient had ultrasound.  Cyst are again identified with no evidence of torsion.  Recommended follow-up with OB/GYN for cyst.  Follow-up with  dentist for dental infection.  Patient will have antibiotics and limited pain medication prescribed for her.  Return precautions discussed with patient..  Patient is given ED precautions to return to the ED for any worsening or new symptoms.        FINAL CLINICAL IMPRESSION(S) / ED DIAGNOSES   Final diagnoses:  Dental infection  Cyst of right ovary     Rx / DC Orders   ED Discharge Orders          Ordered    HYDROcodone-acetaminophen (NORCO/VICODIN) 5-325 MG tablet  Every 4 hours PRN        01/30/22 2142    amoxicillin (AMOXIL) 875 MG tablet  2 times daily        01/30/22 2142             Note:  This document was prepared using Dragon voice recognition software and may include unintentional dictation errors.   Brynda Peon 01/30/22 2144    Rada Hay, MD 01/30/22 [phone]

## 2022-01-30 NOTE — ED Provider Triage Note (Signed)
Emergency Medicine Provider Triage Evaluation Note  Sarah Sosa , a 41 y.o. female  was evaluated in triage.  Pt complains of dental pain and abdominal pain on right. History of ovarian cysts intermittently since a teenager.  Had hysterectomy but still has ovaries.    Review of Systems  Positive: +n/v  Negative: No fever/ chills  Physical Exam  Ht '5\' 6"'$  (1.676 m)   Wt 102.5 kg   LMP 03/25/2018 (Exact Date)   BMI 36.48 kg/m  Gen:   Awake, no distress   Resp:  Normal effort  MSK:   Moves extremities without difficulty  Other:  Left upper facial edema.    Medical Decision Making  Medically screening exam initiated at 1:48 PM.  Appropriate orders placed.  Dorrene Badders was informed that the remainder of the evaluation will be completed by another provider, this initial triage assessment does not replace that evaluation, and the importance of remaining in the ED until their evaluation is complete.     Johnn Hai, PA-C 01/30/22 1352

## 2022-01-30 NOTE — ED Triage Notes (Signed)
Pt c/o left upper tooth ache for 2 days and today woke with facial swelling. Pt c/o RLQ pain for the past week, "I think an ovarian cyst ruptured". Denies N/V/D .

## 2022-02-09 ENCOUNTER — Emergency Department
Admission: EM | Admit: 2022-02-09 | Discharge: 2022-02-09 | Disposition: A | Discharge status: Home / Self Care | Payer: Medicaid Other | Attending: Student in an Organized Health Care Education/Training Program | Admitting: Student in an Organized Health Care Education/Training Program

## 2022-02-09 ENCOUNTER — Other Ambulatory Visit: Payer: Self-pay

## 2022-02-09 ENCOUNTER — Emergency Department: Payer: Medicaid Other

## 2022-02-09 DIAGNOSIS — R1013 Epigastric pain: Secondary | ICD-10-CM | POA: Diagnosis present

## 2022-02-09 DIAGNOSIS — R1031 Right lower quadrant pain: Secondary | ICD-10-CM

## 2022-02-09 DIAGNOSIS — K861 Other chronic pancreatitis: Secondary | ICD-10-CM | POA: Diagnosis not present

## 2022-02-09 LAB — URINALYSIS, ROUTINE W REFLEX MICROSCOPIC
Bilirubin Urine: NEGATIVE
Glucose, UA: NEGATIVE mg/dL
Hgb urine dipstick: NEGATIVE
Ketones, ur: NEGATIVE mg/dL
Leukocytes,Ua: NEGATIVE
Nitrite: NEGATIVE
Protein, ur: 100 mg/dL — AB
Specific Gravity, Urine: 1.028 (ref 1.005–1.030)
pH: 5 (ref 5.0–8.0)

## 2022-02-09 LAB — COMPREHENSIVE METABOLIC PANEL
ALT: 15 U/L (ref 0–44)
AST: 19 U/L (ref 15–41)
Albumin: 4 g/dL (ref 3.5–5.0)
Alkaline Phosphatase: 60 U/L (ref 38–126)
Anion gap: 9 (ref 5–15)
BUN: 12 mg/dL (ref 6–20)
CO2: 22 mmol/L (ref 22–32)
Calcium: 9.2 mg/dL (ref 8.9–10.3)
Chloride: 109 mmol/L (ref 98–111)
Creatinine, Ser: 0.76 mg/dL (ref 0.44–1.00)
GFR, Estimated: >60 mL/min (ref >60)
Glucose, Bld: 92 mg/dL (ref 70–99)
Potassium: 3.6 mmol/L (ref 3.5–5.1)
Sodium: 140 mmol/L (ref 135–145)
Total Bilirubin: 0.6 mg/dL (ref 0.3–1.2)
Total Protein: 7.8 g/dL (ref 6.5–8.1)

## 2022-02-09 LAB — CBC
HCT: 41.5 % (ref 36.0–46.0)
Hemoglobin: 14.1 g/dL (ref 12.0–15.0)
MCH: 30.8 pg (ref 26.0–34.0)
MCHC: 34 g/dL (ref 30.0–36.0)
MCV: 90.6 fL (ref 80.0–100.0)
Platelets: 302 10*3/uL (ref 150–400)
RBC: 4.58 MIL/uL (ref 3.87–5.11)
RDW: 12 % (ref 11.5–15.5)
WBC: 5.2 10*3/uL (ref 4.0–10.5)
nRBC: 0 % (ref 0.0–0.2)

## 2022-02-09 LAB — LIPASE, BLOOD: Lipase: 25 U/L (ref 11–51)

## 2022-02-09 MED ORDER — SODIUM CHLORIDE 0.9 % IV BOLUS
1000.0000 mL | Freq: Once | INTRAVENOUS | Status: AC
  Administered 2022-02-09: 1000 mL via INTRAVENOUS

## 2022-02-09 MED ORDER — IOHEXOL 300 MG/ML  SOLN
100.0000 mL | Freq: Once | INTRAMUSCULAR | Status: AC | PRN
  Administered 2022-02-09: 100 mL via INTRAVENOUS

## 2022-02-09 MED ORDER — ONDANSETRON HCL 4 MG/2ML IJ SOLN
4.0000 mg | Freq: Once | INTRAMUSCULAR | Status: AC
  Administered 2022-02-09: 4 mg via INTRAVENOUS
  Filled 2022-02-09: qty 2

## 2022-02-09 MED ORDER — OXYCODONE HCL 10 MG PO TABS: 10.0000 mg | ORAL_TABLET | Freq: Four times a day (QID) | ORAL | 0 refills | Status: DC | PRN

## 2022-02-09 MED ORDER — PANCRELIPASE (LIP-PROT-AMYL) 12000-38000 UNITS PO CPEP
12000.0000 [IU] | ORAL_CAPSULE | Freq: Once | ORAL | Status: AC
Start: 2022-02-09 — End: 2022-02-09
  Administered 2022-02-09: 12000 [IU] via ORAL
  Filled 2022-02-09: qty 1

## 2022-02-09 MED ORDER — ONDANSETRON 4 MG PO TBDP: 4.0000 mg | ORAL_TABLET | Freq: Three times a day (TID) | ORAL | 0 refills | Status: DC | PRN

## 2022-02-09 MED ORDER — OXYCODONE-ACETAMINOPHEN 5-325 MG PO TABS
1.0000 | ORAL_TABLET | Freq: Once | ORAL | Status: AC
  Administered 2022-02-09: 1 via ORAL
  Filled 2022-02-09: qty 1

## 2022-02-09 MED ORDER — HYDROMORPHONE HCL 1 MG/ML IJ SOLN
1.0000 mg | INTRAMUSCULAR | Status: DC | PRN
  Administered 2022-02-09: 1 mg via INTRAVENOUS
  Filled 2022-02-09: qty 1

## 2022-02-09 NOTE — ED Notes (Signed)
Pt says she takes 4 BP medications at night

## 2022-02-09 NOTE — Discharge Instructions (Signed)

## 2022-02-09 NOTE — ED Notes (Signed)
Pt had cath procedure on the 21st of July. 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 Pt's right arm remains swollen from procedure.

## 2022-02-09 NOTE — ED Provider Notes (Signed)
[facility] Provider Note    Event Date/Time   First MD Initiated Contact with Patient 02/09/22 1948     (approximate)   History   Abdominal Pain (C/o LUQ abdominal pain and RLQ abdominal pain with diarrhea and vomiting x2 days. Pt. States she has cysts on right ovary that are causing her pain, and her pancreatitis has flared up.no meds for symptoms today. Pt. Denies CP, SOB, fevers.)   HPI  Sarah Sosa is a 41 y.o. female with a history of chronic alcoholic pancreatitis as well as ovarian cyst presents to the ER for evaluation of epigastric pain rating through to her back nausea vomiting inability to take her medications at home secondary to the pain.  She is also having some right lower quadrant pain.  Does still have her appendix.  States the right lower quadrant pain is new and has been having some nausea and vomiting.  Was recently on a course of antibiotics for dental infection last week.     Physical Exam   Triage Vital Signs: ED Triage Vitals  Enc Vitals Group     BP 02/09/22 1945 (!) 190/91     Pulse Rate 02/09/22 1803 77     Resp 02/09/22 1803 18     Temp 02/09/22 1803 98.6 F (37 C)     Temp Source 02/09/22 1803 Oral     SpO2 02/09/22 1803 99 %     Weight 02/09/22 1805 235 lb 11.2 oz (106.9 kg)     Height 02/09/22 1805 '5\' 6"'$  (1.676 m)     Head Circumference --      Peak Flow --      Pain Score 02/09/22 1804 9     Pain Loc --      Pain Edu? --      Excl. in [place]? --     Most recent vital signs: Vitals:   02/09/22 2150 02/09/22 2250  BP:  (!) 167/128  Pulse: 71 82  Resp: 20 18  Temp:  98.3 F (36.8 C)  SpO2: 97% 97%     Constitutional: Alert  Eyes: Conjunctivae are normal.  Head: Atraumatic. Nose: No congestion/rhinnorhea. Mouth/Throat: Mucous membranes are moist.   Neck: Painless ROM.  Cardiovascular:   Good peripheral circulation. Respiratory: Normal respiratory effort.  No retractions.  Gastrointestinal: Soft with mild  ttp in rlq, no guarding or rebound Musculoskeletal:  no deformity Neurologic:  MAE spontaneously. No gross focal neurologic deficits are appreciated.  Skin:  Skin is warm, dry and intact. No rash noted. Psychiatric: Mood and affect are normal. Speech and behavior are normal.    ED Results / Procedures / Treatments   Labs (all labs ordered are listed, but only abnormal results are displayed) Labs Reviewed  URINALYSIS, ROUTINE W REFLEX MICROSCOPIC - Abnormal; Notable for the following components:      Result Value   Color, Urine YELLOW (*)    APPearance CLOUDY (*)    Protein, ur 100 (*)    Bacteria, UA RARE (*)    All other components within normal limits  LIPASE, BLOOD  COMPREHENSIVE METABOLIC PANEL  CBC  POC URINE PREG, ED     EKG  ED ECG REPORT I, Merlyn Lot, the attending physician, personally viewed and interpreted this ECG.   Date: 02/09/2022  EKG Time: 18:28  Rate: 75  Rhythm: sinus  Axis: normal  Intervals: normal qt  ST&T Change: no stemi, no depressions    RADIOLOGY Please see ED Course  for my review and interpretation.  I personally reviewed all radiographic images ordered to evaluate for the above acute complaints and reviewed radiology reports and findings.  These findings were personally discussed with the patient.  Please see medical record for radiology report.    PROCEDURES:  Critical Care performed:   Procedures   MEDICATIONS ORDERED IN ED: Medications  HYDROmorphone (DILAUDID) injection 1 mg (1 mg Intravenous Given 02/09/22 2043)  sodium chloride 0.9 % bolus 1,000 mL (0 mLs Intravenous Stopped 02/09/22 2209)  ondansetron (ZOFRAN) injection 4 mg (4 mg Intravenous Given 02/09/22 2044)  iohexol (OMNIPAQUE) 300 MG/ML solution 100 mL (100 mLs Intravenous Contrast Given 02/09/22 2100)  oxyCODONE-acetaminophen (PERCOCET/ROXICET) 5-325 MG per tablet 1 tablet (1 tablet Oral Given 02/09/22 2209)  lipase/protease/amylase (CREON) capsule 12,000  Units (12,000 Units Oral Given 02/09/22 2209)  oxyCODONE-acetaminophen (PERCOCET/ROXICET) 5-325 MG per tablet 1 tablet (1 tablet Oral Given 02/09/22 2245)     IMPRESSION / MDM / ASSESSMENT AND PLAN / ED COURSE  I reviewed the triage vital signs and the nursing notes.                              Differential diagnosis includes, but is not limited to, pancreatitis, appendicitis, cyst, colitis, torsion, pid, dehydration, electrolyte abnormality  Patient presented to the ER for evaluation of symptoms as described above. This presenting complaint could reflect a potentially life-threatening illness therefore the patient will be placed on continuous pulse oximetry and telemetry for monitoring.  Laboratory evaluation will be sent to evaluate for the above complaints.  Patient has history of chronic recurrent pancreatitis and chronic pain does appear uncomfortable her blood work is reassuring but had on review of past medical record previous CT imaging showing acute severe pancreatitis with borderline elevation of lipase.  Given her right lower quadrant pain and concern for possible appendicitis versus pelvic etiology will order repeat CT.  CT imaging on my review and interpretation does not show any evidence of obstruction.  No evidence of acute appendicitis or acute intra-abdominal process.  Patient's pain improved she is tolerating p.o.  This point believe she stable and appropriate for outpatient follow-up.      FINAL CLINICAL IMPRESSION(S) / ED DIAGNOSES   Final diagnoses:  Chronic pancreatitis, unspecified pancreatitis type ([place])  Right lower quadrant abdominal pain     Rx / DC Orders   ED Discharge Orders          Ordered    Oxycodone HCl 10 MG TABS  Every 6 hours PRN        02/09/22 2245    ondansetron (ZOFRAN-ODT) 4 MG disintegrating tablet  Every 8 hours PRN        02/09/22 2245             Note:  This document was prepared using Dragon voice recognition software and may  include unintentional dictation errors.    Merlyn Lot, MD 02/09/22 [phone]

## 2022-02-09 NOTE — ED Triage Notes (Signed)
C/o LUQ abdominal pain and RLQ abdominal pain with diarrhea and vomiting x2 days. Pt. States she has cysts on right ovary that are causing her pain, and her pancreatitis has flared up.no meds for symptoms today. Pt. Denies CP, SOB, fevers.

## 2022-02-16 ENCOUNTER — Encounter: Payer: Self-pay | Admitting: Emergency Medicine

## 2022-02-16 ENCOUNTER — Emergency Department
Admission: EM | Admit: 2022-02-16 | Discharge: 2022-02-16 | Disposition: A | Discharge status: Home / Self Care | Payer: Medicaid Other | Attending: Emergency Medicine | Admitting: Emergency Medicine

## 2022-02-16 DIAGNOSIS — E876 Hypokalemia: Secondary | ICD-10-CM | POA: Insufficient documentation

## 2022-02-16 DIAGNOSIS — R1084 Generalized abdominal pain: Secondary | ICD-10-CM | POA: Insufficient documentation

## 2022-02-16 DIAGNOSIS — G8929 Other chronic pain: Secondary | ICD-10-CM | POA: Diagnosis not present

## 2022-02-16 DIAGNOSIS — R109 Unspecified abdominal pain: Secondary | ICD-10-CM | POA: Diagnosis present

## 2022-02-16 LAB — COMPREHENSIVE METABOLIC PANEL
ALT: 15 U/L (ref 0–44)
AST: 19 U/L (ref 15–41)
Albumin: 3.9 g/dL (ref 3.5–5.0)
Alkaline Phosphatase: 60 U/L (ref 38–126)
Anion gap: 9 (ref 5–15)
BUN: 17 mg/dL (ref 6–20)
CO2: 21 mmol/L — ABNORMAL LOW (ref 22–32)
Calcium: 9.5 mg/dL (ref 8.9–10.3)
Chloride: 109 mmol/L (ref 98–111)
Creatinine, Ser: 0.96 mg/dL (ref 0.44–1.00)
GFR, Estimated: >60 mL/min (ref >60)
Glucose, Bld: 115 mg/dL — ABNORMAL HIGH (ref 70–99)
Potassium: 3.3 mmol/L — ABNORMAL LOW (ref 3.5–5.1)
Sodium: 139 mmol/L (ref 135–145)
Total Bilirubin: 0.6 mg/dL (ref 0.3–1.2)
Total Protein: 7.3 g/dL (ref 6.5–8.1)

## 2022-02-16 LAB — CBC
HCT: 40 % (ref 36.0–46.0)
Hemoglobin: 13.4 g/dL (ref 12.0–15.0)
MCH: 31 pg (ref 26.0–34.0)
MCHC: 33.5 g/dL (ref 30.0–36.0)
MCV: 92.6 fL (ref 80.0–100.0)
Platelets: 285 10*3/uL (ref 150–400)
RBC: 4.32 MIL/uL (ref 3.87–5.11)
RDW: 12.1 % (ref 11.5–15.5)
WBC: 5.1 10*3/uL (ref 4.0–10.5)
nRBC: 0 % (ref 0.0–0.2)

## 2022-02-16 LAB — LIPASE, BLOOD: Lipase: 25 U/L (ref 11–51)

## 2022-02-16 MED ORDER — DROPERIDOL 2.5 MG/ML IJ SOLN
5.0000 mg | Freq: Once | INTRAMUSCULAR | Status: AC
  Administered 2022-02-16: 5 mg via INTRAVENOUS
  Filled 2022-02-16: qty 2

## 2022-02-16 MED ORDER — DIPHENHYDRAMINE HCL 50 MG/ML IJ SOLN
25.0000 mg | INTRAMUSCULAR | Status: AC
  Administered 2022-02-16: 25 mg via INTRAVENOUS
  Filled 2022-02-16: qty 1

## 2022-02-16 MED ORDER — LACTATED RINGERS IV BOLUS
1000.0000 mL | Freq: Once | INTRAVENOUS | Status: AC
  Administered 2022-02-16: 1000 mL via INTRAVENOUS

## 2022-02-16 MED ORDER — CLONIDINE HCL 0.1 MG PO TABS
0.1000 mg | ORAL_TABLET | Freq: Once | ORAL | Status: AC
Start: 2022-02-16 — End: 2022-02-16
  Administered 2022-02-16: 0.1 mg via ORAL
  Filled 2022-02-16: qty 1

## 2022-02-16 MED ORDER — ACETAMINOPHEN 500 MG PO TABS
1000.0000 mg | ORAL_TABLET | Freq: Once | ORAL | Status: AC
  Administered 2022-02-16: 1000 mg via ORAL
  Filled 2022-02-16: qty 2

## 2022-02-16 NOTE — ED Provider Notes (Signed)
[facility] Provider Note    Event Date/Time   First MD Initiated Contact with Patient 02/16/22 [phone]     (approximate)   History   Abdominal Pain   HPI  Sarah Sosa is a 41 y.o. female with frequent visits to the emergency department for abdominal pain who reports that she suffers from chronic pancreatitis.  Her medical history includes, but is not limited to, chronic pain disorder, intractable nausea and vomiting, pelvic pain, recurrent pancreatitis.  This is her ninth ED visit in 6 months.  Her last ED visit was a week ago.  She presents tonight for persistent abdominal pain.  She says that it never went away from her last visit.  It radiates all around her abdomen and feels no different than usual.  It hurts worse when she eats greasy foods but says that it hurts all the time.  She says she cannot find any position of comfort.  She has had nausea but no recent vomiting.  She denies any ongoing alcohol use and says that she stopped that a long time ago.  She denies drug use except for marijuana which she says she smokes regularly but not every day.  Nothing particular makes her feel better.     Physical Exam   Triage Vital Signs: ED Triage Vitals [02/16/22 0526]  Enc Vitals Group     BP (!) 187/105     Pulse Rate 66     Resp 14     Temp 98.9 F (37.2 C)     Temp Source Oral     SpO2 99 %     Weight      Height      Head Circumference      Peak Flow      Pain Score      Pain Loc      Pain Edu?      Excl. in [place]?     Most recent vital signs: Vitals:   02/16/22 0526 02/16/22 0722  BP: (!) 187/105 (!) 150/139  Pulse: 66 76  Resp: 14   Temp: 98.9 F (37.2 C)   SpO2: 99% 100%     General: Awake, appears to be in moderate distress, anxious and walking around the room, says she cannot find a comfortable position. CV:  Good peripheral perfusion.  Regular rate and rhythm. Resp:  Normal effort.  Lungs are clear to auscultation  bilaterally. Abd:  No distention.  Mild generalized tenderness to palpation throughout the abdomen.  Better with distraction.  No rebound or guarding. Other:  Ambulatory without any.  Difficulty although she does appear uncomfortable.  Seems somewhat agitated but her mood and affect are appropriate under the circumstances.   ED Results / Procedures / Treatments   Labs (all labs ordered are listed, but only abnormal results are displayed) Labs Reviewed  COMPREHENSIVE METABOLIC PANEL - Abnormal; Notable for the following components:      Result Value   Potassium 3.3 (*)    CO2 21 (*)    Glucose, Bld 115 (*)    All other components within normal limits  LIPASE, BLOOD  CBC  URINALYSIS, ROUTINE W REFLEX MICROSCOPIC     EKG  I reviewed prior EKGs including the one from 1 week ago and verified that her QTc interval is normal.   RADIOLOGY No imaging ordered tonight.    PROCEDURES:  Critical Care performed: No  .1-3 Lead EKG Interpretation  Performed by: Hinda Kehr, MD Authorized by:  Hinda Kehr, MD     Interpretation: normal     ECG rate:  65   ECG rate assessment: normal     Rhythm: sinus rhythm     Ectopy: none     Conduction: normal      MEDICATIONS ORDERED IN ED: Medications  diphenhydrAMINE (BENADRYL) injection 25 mg (25 mg Intravenous Given 02/16/22 0717)  lactated ringers bolus 1,000 mL (1,000 mLs Intravenous New Bag/Given 02/16/22 0720)  droperidol (INAPSINE) 2.5 MG/ML injection 5 mg (5 mg Intravenous Given 02/16/22 0717)     IMPRESSION / MDM / ASSESSMENT AND PLAN / ED COURSE  I reviewed the triage vital signs and the nursing notes.                              Differential diagnosis includes, but is not limited to, acute on chronic abdominal pain, cannabinoid hyperemesis syndrome, cyclic vomiting syndrome, ileus, SBO/obstruction.  Patient's presentation is most consistent with exacerbation of chronic illness.  Patient's vital signs are stable and  within normal limits.  Labs/studies ordered include comprehensive metabolic panel, lipase, CBC, and urinalysis.  I looked through the medical record including her visit from 1 week ago.  She had a CT scan of the abdomen and pelvis at that time.  There were no acute findings except possibly some colitis.  I looked back through the medical record and since 02/14/2018, she has had 15 CT scans of the abdomen and pelvis with IV contrast, just as recorded in [facility].  I looked in care everywhere and read the gastroenterology office visit note from 02/14/2022, less than 48 hours ago.  They are concerned about chronic pancreatitis and mentioned that she reported to them that she is in chronic and constant pain.  They plan to proceed with outpatient endoscopy.  She takes Creon as an outpatient .  I worry about opioid dependence in the setting of chronic abdominal pain.  I checked the [place] controlled substance database and she has multiple prescriptions from multiple providers for various opioids.  Lab work is reassuring with no acute abnormalities including no elevation of her lipase, no leukocytosis, and no significant electrolyte or metabolic abnormalities.  Her potassium is slightly decreased at 3.3 but this is likely not clinically significant.  I feel that a good treatment for her acute on chronic abdominal pain at this point will be droperidol 5 mg IV which will also help with her agitation.  I also ordered 1 L IV bolus of LR and Benadryl 25 mg IV.  The patient is also on pulse oximeter.  The patient is on the cardiac monitor to evaluate for evidence of arrhythmia and/or significant heart rate changes.    Transferring ED care to Dr. Jari Pigg to follow-up and reassess after the medication treatment to see if it has made her feel better.      FINAL CLINICAL IMPRESSION(S) / ED DIAGNOSES   Final diagnoses:  Chronic abdominal pain     Rx / DC Orders   ED Discharge Orders     None        Note:   This document was prepared using Dragon voice recognition software and may include unintentional dictation errors.   Hinda Kehr, MD 02/16/22 [phone]

## 2022-02-16 NOTE — ED Triage Notes (Signed)
Pt arrived via [facility] from home with c/olower abdominal pain that radiates around left flank. Pt has chronic pancreatitis and sts she always hurts but pain has increased. Pt reports nausea but no emesis.

## 2022-02-16 NOTE — Discharge Instructions (Signed)
It is important that you follow-up with your GI team or your pain clinic for your chronic abdominal pain.  Given your lipase is normal and your recent CT scan did not show active pancreatitis you need somebody who is able to prescribe you pain medication as indicated for your chronic pain.  You can return to the ER if you develop fevers, worsening pain and would be happy to reevaluate you at that time.

## 2022-02-16 NOTE — ED Notes (Signed)
Patient states to this RN that she "is withdrawing from oxy." Patient received prescription on Sunday and states she has been taking as prescribed. Patient c/o jerking and pain not getting better after medications received in ED. Jari Pigg, MD aware.

## 2022-02-16 NOTE — ED Provider Notes (Addendum)
8:48 AM Assumed care for off going team.   Blood pressure (!) 150/139, pulse 76, temperature 98.9 F (37.2 C), temperature source Oral, resp. rate 18, last menstrual period 03/25/2018, SpO2 100 %.  See their HPI for full report but in brief pending re-eval after medication but try to avoid opioids.   I reviewed the CT scan from 7/27 where there was no pancreatitis noted on the CT imaging but there was some concern about possible colitis-patient was discharged home on oxycodone, Zofran.  When she had prior admissions for her pancreatitis her lipase has been elevated last in May 2023 where was 188.  Patient's also been seen at [facility] in March 2023 for accidental opioid overdose requiring Narcan.   Patient is coming to the emergency room multiple times for oxycodone and I am concerned about this being a chronic illness that does not require opioid prescription today.  Patient is sleeping comfortably in the room.  When I wake her up she reports that she feels like she is having an opioid withdrawal and starts to jerk around in the bed a little bit.  Patient is hypertensive we will give some clonidine, Tylenol.  We discussed the risk of opioids and trying to avoid opioids in the future due to all of the risk or needing to be seen by GI or pain clinic for her chronic pain but given her normal lipase and reassuring CT scan a few days ago no indication for opioid treatment today.  Her abdomen is soft and nontender on my examination without any rebound or guarding.  She expressed understanding.  She is tolerating drinking water and feels comfortable with discharge home.  Pt does report having someone to drive her home.   Vanessa [place], MD 02/16/22 0737    Vanessa [place], MD 02/16/22 [phone]

## 2022-02-18 ENCOUNTER — Encounter: Payer: Self-pay | Admitting: Emergency Medicine

## 2022-02-18 ENCOUNTER — Emergency Department
Admission: EM | Admit: 2022-02-18 | Discharge: 2022-02-18 | Disposition: A | Discharge status: Home / Self Care | Payer: Medicaid Other | Attending: Emergency Medicine | Admitting: Emergency Medicine

## 2022-02-18 ENCOUNTER — Other Ambulatory Visit: Payer: Self-pay

## 2022-02-18 ENCOUNTER — Emergency Department: Payer: Medicaid Other

## 2022-02-18 DIAGNOSIS — R1030 Lower abdominal pain, unspecified: Secondary | ICD-10-CM | POA: Diagnosis present

## 2022-02-18 DIAGNOSIS — R102 Pelvic and perineal pain: Secondary | ICD-10-CM | POA: Diagnosis not present

## 2022-02-18 DIAGNOSIS — N899 Noninflammatory disorder of vagina, unspecified: Secondary | ICD-10-CM | POA: Diagnosis not present

## 2022-02-18 LAB — CHLAMYDIA/NGC RT PCR (ARMC ONLY)
Chlamydia Tr: NOT DETECTED
N gonorrhoeae: NOT DETECTED

## 2022-02-18 LAB — CBC WITH DIFFERENTIAL/PLATELET
Abs Immature Granulocytes: 0.01 10*3/uL (ref 0.00–0.07)
Basophils Absolute: 0.1 10*3/uL (ref 0.0–0.1)
Basophils Relative: 1 %
Eosinophils Absolute: 0.3 10*3/uL (ref 0.0–0.5)
Eosinophils Relative: 6 %
HCT: 44.9 % (ref 36.0–46.0)
Hemoglobin: 14.7 g/dL (ref 12.0–15.0)
Immature Granulocytes: 0 %
Lymphocytes Relative: 44 %
Lymphs Abs: 2.5 10*3/uL (ref 0.7–4.0)
MCH: 29.8 pg (ref 26.0–34.0)
MCHC: 32.7 g/dL (ref 30.0–36.0)
MCV: 91.1 fL (ref 80.0–100.0)
Monocytes Absolute: 0.3 10*3/uL (ref 0.1–1.0)
Monocytes Relative: 5 %
Neutro Abs: 2.5 10*3/uL (ref 1.7–7.7)
Neutrophils Relative %: 44 %
Platelets: 342 10*3/uL (ref 150–400)
RBC: 4.93 MIL/uL (ref 3.87–5.11)
RDW: 12.4 % (ref 11.5–15.5)
WBC: 5.6 10*3/uL (ref 4.0–10.5)
nRBC: 0 % (ref 0.0–0.2)

## 2022-02-18 LAB — COMPREHENSIVE METABOLIC PANEL
ALT: 16 U/L (ref 0–44)
AST: 18 U/L (ref 15–41)
Albumin: 4.2 g/dL (ref 3.5–5.0)
Alkaline Phosphatase: 60 U/L (ref 38–126)
Anion gap: 12 (ref 5–15)
BUN: 14 mg/dL (ref 6–20)
CO2: 18 mmol/L — ABNORMAL LOW (ref 22–32)
Calcium: 9 mg/dL (ref 8.9–10.3)
Chloride: 112 mmol/L — ABNORMAL HIGH (ref 98–111)
Creatinine, Ser: 0.81 mg/dL (ref 0.44–1.00)
GFR, Estimated: >60 mL/min (ref >60)
Glucose, Bld: 128 mg/dL — ABNORMAL HIGH (ref 70–99)
Potassium: 3.3 mmol/L — ABNORMAL LOW (ref 3.5–5.1)
Sodium: 142 mmol/L (ref 135–145)
Total Bilirubin: 0.4 mg/dL (ref 0.3–1.2)
Total Protein: 8 g/dL (ref 6.5–8.1)

## 2022-02-18 LAB — WET PREP, GENITAL
Sperm: NONE SEEN
Trich, Wet Prep: NONE SEEN
WBC, Wet Prep HPF POC: <10 — AB (ref <10)
Yeast Wet Prep HPF POC: NONE SEEN

## 2022-02-18 LAB — LIPASE, BLOOD: Lipase: 25 U/L (ref 11–51)

## 2022-02-18 MED ORDER — DOXYCYCLINE HYCLATE 100 MG PO CAPS: 100.0000 mg | ORAL_CAPSULE | Freq: Two times a day (BID) | ORAL | 0 refills | Status: AC

## 2022-02-18 MED ORDER — GABAPENTIN 300 MG PO CAPS
300.0000 mg | ORAL_CAPSULE | Freq: Once | ORAL | Status: AC
  Administered 2022-02-18: 300 mg via ORAL
  Filled 2022-02-18: qty 1

## 2022-02-18 MED ORDER — CEFTRIAXONE SODIUM 1 G IJ SOLR
500.0000 mg | Freq: Once | INTRAMUSCULAR | Status: AC
  Administered 2022-02-18: 500 mg via INTRAMUSCULAR
  Filled 2022-02-18: qty 10

## 2022-02-18 MED ORDER — ACETAMINOPHEN 325 MG PO TABS
650.0000 mg | ORAL_TABLET | Freq: Once | ORAL | Status: AC
Start: 2022-02-18 — End: 2022-02-18
  Administered 2022-02-18: 650 mg via ORAL
  Filled 2022-02-18: qty 2

## 2022-02-18 MED ORDER — METRONIDAZOLE 500 MG PO TABS: 500.0000 mg | ORAL_TABLET | Freq: Two times a day (BID) | ORAL | 0 refills | Status: AC

## 2022-02-18 NOTE — ED Provider Notes (Signed)
[facility] Provider Note    Event Date/Time   First MD Initiated Contact with Patient 02/18/22 1839     (approximate)   History   Abdominal Pain   HPI  Sabryn Preslar is a 41 y.o. female who presents to the emergency department today with primary concern for lower abdominal pain and abnormal vaginal discharge.  She states that the pelvic pain has been present for the past week.  The patient says she is sexually active in a monogamous relationship.  She denies any problems with urination or defecation.  While the patient was seen in the emergency department 2 days ago she states that was more for her chronic pancreatitis and that over the past week she has been hurting both in the upper and lower abdomen and sometimes when outweighs the other.      Physical Exam   Triage Vital Signs: ED Triage Vitals  Enc Vitals Group     BP 02/18/22 1627 (!) 179/97     Pulse Rate 02/18/22 1627 98     Resp 02/18/22 1627 18     Temp 02/18/22 1627 98.8 F (37.1 C)     Temp Source 02/18/22 1627 Oral     SpO2 02/18/22 1627 96 %     Weight 02/18/22 1624 236 lb (107 kg)     Height 02/18/22 1624 '5\' 6"'$  (1.676 m)     Head Circumference --      Peak Flow --      Pain Score 02/18/22 1624 9     Pain Loc --      Pain Edu? --      Excl. in [place]? --     Most recent vital signs: Vitals:   02/18/22 1627  BP: (!) 179/97  Pulse: 98  Resp: 18  Temp: 98.8 F (37.1 C)  SpO2: 96%    General: Awake, alert, oriented. CV:  Good peripheral perfusion. Regular rate and rhythm. Resp:  Normal effort. Lungs clear. Abd:  No distention. Tender to palpation in upper and lower abdomen.    ED Results / Procedures / Treatments   Labs (all labs ordered are listed, but only abnormal results are displayed) Labs Reviewed  WET PREP, GENITAL - Abnormal; Notable for the following components:      Result Value   Clue Cells Wet Prep HPF POC PRESENT (*)    WBC, Wet Prep HPF POC <10 (*)     All other components within normal limits  COMPREHENSIVE METABOLIC PANEL - Abnormal; Notable for the following components:   Potassium 3.3 (*)    Chloride 112 (*)    CO2 18 (*)    Glucose, Bld 128 (*)    All other components within normal limits  CHLAMYDIA/NGC RT PCR ([facility] ONLY)            CBC WITH DIFFERENTIAL/PLATELET  LIPASE, BLOOD  URINALYSIS, ROUTINE W REFLEX MICROSCOPIC     EKG  None   RADIOLOGY US pelvis IMPRESSION:  No sonographic evidence of ovarian torsion. Interval resolution of  complicated LEFT-sided ovarian cysts since January 30, 2022.     PROCEDURES:  Critical Care performed: No  Procedures   MEDICATIONS ORDERED IN ED: Medications  acetaminophen (TYLENOL) tablet 650 mg (650 mg Oral Given 02/18/22 1634)     IMPRESSION / MDM / ASSESSMENT AND PLAN / ED COURSE  I reviewed the triage vital signs and the nursing notes.  Differential diagnosis includes, but is not limited to, UTI, yeast infection, BV, gastroenteritis.  Patient's presentation is most consistent with acute presentation with potential threat to life or bodily function.  Patient presented to the emergency department today because of concerns for pelvic pain.  Patient also had concerns for vaginal discharge.  Wet prep was positive for clue cells.  Will give patient antibiotics.  Discussed findings with patient.  FINAL CLINICAL IMPRESSION(S) / ED DIAGNOSES   Final diagnoses:  Pelvic pain in female     Note:  This document was prepared using Dragon voice recognition software and may include unintentional dictation errors.    Nance Pear, MD 02/18/22 2017

## 2022-02-18 NOTE — ED Provider Triage Note (Signed)
Emergency Medicine Provider Triage Evaluation Note  Sarah Sosa , a 41 y.o. female  was evaluated in triage.  Pt complains of abdominal pain. Patient reports that she had a large mucus chunk that came out while urinating. Unsure if it came from her urine or vagina. Has abdominal pain diffusely. Reports that she has pain across her epigastrium and RLQ. She reports that she has a known cyst to her right ovary (however, recent US shows left ovary cyst). Reports not sexually active. Reports drank alcohol last night. Reports that she has 4 episodes of vomit. Also reports diarrhea.  Review of Systems  Positive: Epigastric pain, RLQ pain Negative: Fever, dysuria  Physical Exam  LMP 03/25/2018 (Exact Date)  Gen:   Awake, no distress   Resp:  Normal effort  MSK:   Moves extremities without difficulty  Other:    Medical Decision Making  Medically screening exam initiated at 4:22 PM.  Appropriate orders placed.  Sarah Sosa was informed that the remainder of the evaluation will be completed by another provider, this initial triage assessment does not replace that evaluation, and the importance of remaining in the ED until their evaluation is complete.     Marquette Old, PA-C 02/18/22 1628

## 2022-02-18 NOTE — ED Triage Notes (Signed)
Pt via POV from home. Pt c/o lower abd pain. Pt has a hx of pancreatitis and R ovarian cyst. Pt states she went to the rest room and had odd vaginal discharge. Pt did drink alcohol yesterday. Denies any vaginal bleeding or discomfort when she urinates.  Pt is A&OX4 and NAD

## 2022-02-18 NOTE — Discharge Instructions (Signed)
Please seek medical attention for any high fevers, chest pain, shortness of breath, change in behavior, persistent vomiting, bloody stool or any other new or concerning symptoms.  

## 2022-02-26 ENCOUNTER — Other Ambulatory Visit: Payer: Self-pay

## 2022-02-26 ENCOUNTER — Emergency Department
Admission: EM | Admit: 2022-02-26 | Discharge: 2022-02-27 | Disposition: A | Discharge status: Home / Self Care | Payer: Medicaid Other | Attending: Emergency Medicine | Admitting: Emergency Medicine

## 2022-02-26 DIAGNOSIS — R112 Nausea with vomiting, unspecified: Secondary | ICD-10-CM | POA: Diagnosis not present

## 2022-02-26 DIAGNOSIS — E876 Hypokalemia: Secondary | ICD-10-CM | POA: Insufficient documentation

## 2022-02-26 DIAGNOSIS — R1033 Periumbilical pain: Secondary | ICD-10-CM | POA: Insufficient documentation

## 2022-02-26 LAB — LIPASE, BLOOD: Lipase: 23 U/L (ref 11–51)

## 2022-02-26 LAB — CBC
HCT: 37.9 % (ref 36.0–46.0)
Hemoglobin: 12.2 g/dL (ref 12.0–15.0)
MCH: 30.3 pg (ref 26.0–34.0)
MCHC: 32.2 g/dL (ref 30.0–36.0)
MCV: 94 fL (ref 80.0–100.0)
Platelets: 243 10*3/uL (ref 150–400)
RBC: 4.03 MIL/uL (ref 3.87–5.11)
RDW: 12.2 % (ref 11.5–15.5)
WBC: 6 10*3/uL (ref 4.0–10.5)
nRBC: 0 % (ref 0.0–0.2)

## 2022-02-26 LAB — COMPREHENSIVE METABOLIC PANEL
ALT: 12 U/L (ref 0–44)
AST: 13 U/L — ABNORMAL LOW (ref 15–41)
Albumin: 3.5 g/dL (ref 3.5–5.0)
Alkaline Phosphatase: 51 U/L (ref 38–126)
Anion gap: 8 (ref 5–15)
BUN: 12 mg/dL (ref 6–20)
CO2: 28 mmol/L (ref 22–32)
Calcium: 8.7 mg/dL — ABNORMAL LOW (ref 8.9–10.3)
Chloride: 104 mmol/L (ref 98–111)
Creatinine, Ser: 0.87 mg/dL (ref 0.44–1.00)
GFR, Estimated: >60 mL/min (ref >60)
Glucose, Bld: 106 mg/dL — ABNORMAL HIGH (ref 70–99)
Potassium: 3.2 mmol/L — ABNORMAL LOW (ref 3.5–5.1)
Sodium: 140 mmol/L (ref 135–145)
Total Bilirubin: <0.1 mg/dL — ABNORMAL LOW (ref 0.3–1.2)
Total Protein: 6.8 g/dL (ref 6.5–8.1)

## 2022-02-26 LAB — TROPONIN I (HIGH SENSITIVITY): Troponin I (High Sensitivity): 10 ng/L (ref <18)

## 2022-02-26 MED ORDER — POTASSIUM CHLORIDE CRYS ER 20 MEQ PO TBCR
40.0000 meq | EXTENDED_RELEASE_TABLET | Freq: Once | ORAL | Status: AC
  Administered 2022-02-27: 40 meq via ORAL
  Filled 2022-02-26: qty 2

## 2022-02-26 MED ORDER — DROPERIDOL 2.5 MG/ML IJ SOLN
2.5000 mg | Freq: Once | INTRAMUSCULAR | Status: AC
  Administered 2022-02-27: 2.5 mg via INTRAVENOUS
  Filled 2022-02-26: qty 2

## 2022-02-26 MED ORDER — LACTATED RINGERS IV BOLUS
1000.0000 mL | Freq: Once | INTRAVENOUS | Status: AC
  Administered 2022-02-27: 1000 mL via INTRAVENOUS

## 2022-02-26 MED ORDER — POTASSIUM CHLORIDE 10 MEQ/100ML IV SOLN
10.0000 meq | Freq: Once | INTRAVENOUS | Status: AC
  Administered 2022-02-27: 10 meq via INTRAVENOUS
  Filled 2022-02-26: qty 100

## 2022-02-26 MED ORDER — ONDANSETRON 4 MG PO TBDP
4.0000 mg | ORAL_TABLET | Freq: Once | ORAL | Status: AC | PRN
  Administered 2022-02-26: 4 mg via ORAL
  Filled 2022-02-26: qty 1

## 2022-02-26 NOTE — ED Notes (Signed)
Pt to desk again with repeated questions as to why other pts were going back before her. Explained to pt that pts are not seen in order of arrival, and pts are being seen in different parts of the ED based on presenting c/o and resources needed among other factors. Pt voicing frustration about wait time, apology offered, pt walking around lobby at this time.

## 2022-02-26 NOTE — ED Provider Notes (Signed)
[facility] Provider Note    Event Date/Time   First MD Initiated Contact with Patient 02/26/22 2258     (approximate)   History   Abdominal Pain   HPI  Sarah Sosa is a 41 y.o. female who presents to the ED for evaluation of Abdominal Pain   I reviewed cardiology note notes from 7/21, outpatient cardiac cath.  History of preserved ejection fraction, ascending aortic aneurysm 4.5 cm, ethanol abuse, severe aortic regurgitation with upcoming graft next month scheduled, chronic pancreatitis, possible COPD.  Also reviewed recurrent ED visits for chronic abdominal pain and documented suspected drug-seeking behavior.  5 CT scans of the abdomen/pelvis within our system in the past 5 months, as recently as 2 weeks ago.   Patient presents to the ED for evaluation of 4-5 days of periumbilical abdominal pain with nausea and emesis.  She reports primarily concern for "feels like I have appendicitis again."  She reports pain originating from her periumbilical abdomen, around her left lower flank.  Reports these are typical symptoms for her.  Reports nausea and emesis up to 4 episodes of emesis per day.  Denies any shortness of breath, increased sputum production, chest pain or higher/thoracic back pain.  No syncopal episodes, fever.  No weakness to the extremities or other sensorium/strength changes.   Physical Exam   Triage Vital Signs: ED Triage Vitals  Enc Vitals Group     BP 02/26/22 1839 (!) 144/82     Pulse Rate 02/26/22 1836 87     Resp 02/26/22 1836 20     Temp 02/26/22 1836 98.5 F (36.9 C)     Temp Source 02/26/22 1836 Oral     SpO2 02/26/22 1836 100 %     Weight 02/26/22 1837 237 lb (107.5 kg)     Height 02/26/22 1837 '5\' 6"'$  (1.676 m)     Head Circumference --      Peak Flow --      Pain Score 02/26/22 1837 10     Pain Loc --      Pain Edu? --      Excl. in [place]? --     Most recent vital signs: Vitals:   02/27/22 0030 02/27/22 0133  BP: 124/80  122/70  Pulse: 64 62  Resp: 18 16  Temp:  98.5 F (36.9 C)  SpO2: 100% 100%    General: Awake, no distress.  Morbidly obese.  Resting comfortably went into the room, asleep, and awakens easily to vocal stimulation.  Stays awake and conversational. CV:  Good peripheral perfusion.  Blowing holosystolic murmur is noted Resp:  Normal effort.  No wheezing Abd:  No distention.  Mild and poorly localizing periumbilical abdominal tenderness without peritoneal features.  No guarding. MSK:  No deformity noted.  No signs of gross pitting edema or volume overload Neuro:  No focal deficits appreciated. Other:     ED Results / Procedures / Treatments   Labs (all labs ordered are listed, but only abnormal results are displayed) Labs Reviewed  COMPREHENSIVE METABOLIC PANEL - Abnormal; Notable for the following components:      Result Value   Potassium 3.2 (*)    Glucose, Bld 106 (*)    Calcium 8.7 (*)    AST 13 (*)    Total Bilirubin <0.1 (*)    All other components within normal limits  URINALYSIS, ROUTINE W REFLEX MICROSCOPIC - Abnormal; Notable for the following components:   Color, Urine YELLOW (*)  APPearance CLEAR (*)    Specific Gravity, Urine >1.046 (*)    All other components within normal limits  LIPASE, BLOOD  CBC  POC URINE PREG, ED  TROPONIN I (HIGH SENSITIVITY)  TROPONIN I (HIGH SENSITIVITY)    EKG Tremulous baseline, sinus rhythm with a rate of 66 bpm.  Normal axis and intervals.  Stigmata of LVH.  Nonspecific ST changes anteriorly, septally.  No clear STEMI.  RADIOLOGY CT abdomen/pelvis interpreted by me without evidence of acute intra-abdominal pathology.  Official radiology report(s): CT ABDOMEN PELVIS W CONTRAST  Result Date: 02/27/2022 CLINICAL DATA:  Abdominal pain with history of recurrent pancreatitis. EXAM: CT ABDOMEN AND PELVIS WITH CONTRAST TECHNIQUE: Multidetector CT imaging of the abdomen and pelvis was performed using the standard protocol following  bolus administration of intravenous contrast. RADIATION DOSE REDUCTION: This exam was performed according to the departmental dose-optimization program which includes automated exposure control, adjustment of the mA and/or kV according to patient size and/or use of iterative reconstruction technique. CONTRAST:  155m OMNIPAQUE IOHEXOL 300 MG/ML  SOLN COMPARISON:  Multiple CTs dating back to 2019. The 2 most recent are CTs with IV contrast 02/09/2022 and 11/20/2021. FINDINGS: Lower chest: Scattered linear atelectasis or scarring without infiltrates. Mild-to-moderate cardiomegaly again noted without pericardial effusion. No acute abnormality. Hepatobiliary: 19 cm length liver with slight steatosis. No mass. Again noted surgical absence of gallbladder without biliary dilatation. Pancreas: There is mild pancreatic ductal dilatation, mild partial pancreatic atrophy, but no mass enhancement or adjacent inflammatory change. CT findings may lag behind clinical findings of pancreatitis. Spleen: No focal abnormality. Adrenals/Urinary Tract: No adrenal or renal mass enhancement. No urinary stones or obstruction. The bladder is unremarkable for the degree of distention. Stomach/Bowel: No dilatation or wall thickening including the rectum. Moderate to severe increased fecal retention ascending and transverse colon. The normal caliber appendix is well visible on the coronal reformats. Again noted evidence of internal hernia with clockwise swirling of the low central abdominal mesenteric vessels extending to the right on axial images 55-64, but there is no entrapped bowel or upstream dilatation associated with this. On the last CT there was evidence of ascending and transverse colitis which is not seen today. Vascular/Lymphatic: Aortic atherosclerosis. No enlarged abdominal or pelvic lymph nodes. Reproductive: Surgically absent uterus. The ovaries are follicular but not enlarged. Other: There is trace fluid in the pelvic  cul-de-sac of low-density. This was seen on both of the prior studies is probably physiologic. There is no free hemorrhage, free air or abscess. No acute inflammatory changes. Small umbilical fat hernia. No incarcerated hernia. Internal hernia described above. Musculoskeletal: Degenerative disc disease and endplate irregularities are noted in the lower thoracic spine. Chronic sacroiliitis. No acute or other significant osseous findings. Lower lumbar facet DJD. IMPRESSION: 1. No acute abdominal or pelvic process identified. 2. Moderate to severe increased stool retention ascending and transverse colon. 3. Internal hernia again noted with clockwise swirling of low central abdominal mesenteric vessels extending to the right, but apparently is nonobstructing and there is no entrapped bowel. 4. Cardiomegaly. 5. Aortic atherosclerosis. Electronically Signed   By: KTelford NabM.D.   On: 02/27/2022 00:43    PROCEDURES and INTERVENTIONS:  .1-3 Lead EKG Interpretation  Performed by: SVladimir Crofts MD Authorized by: SVladimir Crofts MD     Interpretation: normal     ECG rate:  60   ECG rate assessment: normal     Rhythm: sinus rhythm     Ectopy: none     Conduction:  normal     Medications  ondansetron (ZOFRAN-ODT) disintegrating tablet 4 mg (4 mg Oral Given 02/26/22 1843)  lactated ringers bolus 1,000 mL (0 mLs Intravenous Stopped 02/27/22 0133)  potassium chloride SA (KLOR-CON M) CR tablet 40 mEq (40 mEq Oral Given 02/27/22 0029)  potassium chloride 10 mEq in 100 mL IVPB (0 mEq Intravenous Stopped 02/27/22 0133)  droperidol (INAPSINE) 2.5 MG/ML injection 2.5 mg (2.5 mg Intravenous Given 02/27/22 0009)  iohexol (OMNIPAQUE) 300 MG/ML solution 100 mL (100 mLs Intravenous Contrast Given 02/27/22 0005)     IMPRESSION / MDM / ASSESSMENT AND PLAN / ED COURSE  I reviewed the triage vital signs and the nursing notes.  Differential diagnosis includes, but is not limited to, pancreatitis, SBO, constipation,  malingering, aortic pathology, ACS  {Patient presents with symptoms of an acute illness or injury that is potentially life-threatening.  41 year old female presents to the ED with acute on chronic abdominal pain and emesis, possibly due to constipation but without evidence of further acute pathology and ultimately suitable for outpatient management.  Looks systemically well and has a fairly reassuring examination.  Mild tenderness without peritoneal features or guarding.  Otherwise benign exam.  Normal CBC and lipase.  Metabolic panel with mild hypokalemia that is repleted orally.  Troponin is normal as well as a CT scan without evidence of acute pathology.  Constipated findings are noted and possibly related to her discomfort.  No radiographic or serum evidence of pancreatitis.  Unlikely to represent aortic pathology considering her underlying derangement is ascending in nature and she has no thoracic or chest discomfort.   Awaiting urinalysis and repeat troponin.  She is tolerating p.o. intake and anticipate she will be suitable for outpatient management.  Clinical Course as of 02/27/22 0141  Mon Feb 27, 2022  0059 Reassessed.  Patient reports feeling little bit better.  We discussed reassuring work-up so far, including CT scan, lipase and other serum diagnostics.  She is eating a full cup of applesauce and requesting more to drink and eat.  Urged her to provide a urine sample [DS]  0133 Reassessed. Patient ate more apple sauce. We discussed constipation and return precautions. [DS]    Clinical Course User Index [DS] Vladimir Crofts, MD     FINAL CLINICAL IMPRESSION(S) / ED DIAGNOSES   Final diagnoses:  Periumbilical abdominal pain  Nausea and vomiting, unspecified vomiting type  Hypokalemia     Rx / DC Orders   ED Discharge Orders     None        Note:  This document was prepared using Dragon voice recognition software and may include unintentional dictation errors.   Vladimir Crofts, MD 02/27/22 [id number]

## 2022-02-26 NOTE — ED Notes (Signed)
Lab called at this time to add on troponin, advised by lab staff that they would call back if they were not able to add on

## 2022-02-26 NOTE — ED Notes (Signed)
Pt to desk inquiring about wait times. Advised pt unable to estimate as many factors are involved in pts being roomed. Pt asking if she can go outside to smoke. Informed pt that 1. This is a non smoking campus and 2. IV was placed and if she were to go outside, IV would need to be removed before doing so. Pt questioning why and this nurse advised pt that there are multiple safety concerns with pt going outside facility with IV in place. And that pt would need to have it removed if she wished to go outside. Pt verbalizes understanding and returned to seat at this time.

## 2022-02-26 NOTE — ED Triage Notes (Signed)
Pt with c/o abdominal pain, pt states she has chronic pancreatitis and has been eating fried food lately. Pt states she has been taking creon but that is not helping. Pt with c/o N/V/D as well, pt states this is usual with her flareups.

## 2022-02-27 ENCOUNTER — Emergency Department: Payer: Medicaid Other

## 2022-02-27 LAB — URINALYSIS, ROUTINE W REFLEX MICROSCOPIC
Bilirubin Urine: NEGATIVE
Glucose, UA: NEGATIVE mg/dL
Hgb urine dipstick: NEGATIVE
Ketones, ur: NEGATIVE mg/dL
Leukocytes,Ua: NEGATIVE
Nitrite: NEGATIVE
Protein, ur: NEGATIVE mg/dL
Specific Gravity, Urine: >1.046 — ABNORMAL HIGH (ref 1.005–1.030)
pH: 6 (ref 5.0–8.0)

## 2022-02-27 LAB — TROPONIN I (HIGH SENSITIVITY): Troponin I (High Sensitivity): 9 ng/L (ref <18)

## 2022-02-27 MED ORDER — IOHEXOL 300 MG/ML  SOLN
100.0000 mL | Freq: Once | INTRAMUSCULAR | Status: AC | PRN
  Administered 2022-02-27: 100 mL via INTRAVENOUS

## 2022-02-27 NOTE — ED Notes (Signed)
Writer woke pt up from a deep sleep to try to get urine sample. Pt states she attempted to give urine sample earlier and the cup broke. Pt currently refusing to try to give a new sample at this time. Provider aware. Pt also requesting pain medication. Provider aware. No new orders.

## 2022-03-17 HISTORY — PX: AORTIC VALVE REPLACEMENT: SHX41

## 2022-04-12 DIAGNOSIS — Z95828 Presence of other vascular implants and grafts: Secondary | ICD-10-CM | POA: Insufficient documentation

## 2022-05-03 ENCOUNTER — Other Ambulatory Visit: Payer: Self-pay

## 2022-05-03 ENCOUNTER — Emergency Department: Payer: Medicaid Other

## 2022-05-03 ENCOUNTER — Emergency Department
Admission: EM | Admit: 2022-05-03 | Discharge: 2022-05-03 | Disposition: A | Discharge status: Home / Self Care | Payer: Medicaid Other | Attending: Emergency Medicine | Admitting: Emergency Medicine

## 2022-05-03 DIAGNOSIS — R42 Dizziness and giddiness: Secondary | ICD-10-CM | POA: Insufficient documentation

## 2022-05-03 DIAGNOSIS — R0789 Other chest pain: Secondary | ICD-10-CM

## 2022-05-03 DIAGNOSIS — S299XXA Unspecified injury of thorax, initial encounter: Secondary | ICD-10-CM | POA: Diagnosis present

## 2022-05-03 DIAGNOSIS — W501XXA Accidental kick by another person, initial encounter: Secondary | ICD-10-CM | POA: Insufficient documentation

## 2022-05-03 DIAGNOSIS — R0602 Shortness of breath: Secondary | ICD-10-CM | POA: Insufficient documentation

## 2022-05-03 DIAGNOSIS — S20219A Contusion of unspecified front wall of thorax, initial encounter: Secondary | ICD-10-CM | POA: Insufficient documentation

## 2022-05-03 LAB — COMPREHENSIVE METABOLIC PANEL
ALT: 11 U/L (ref 0–44)
AST: 16 U/L (ref 15–41)
Albumin: 3.5 g/dL (ref 3.5–5.0)
Alkaline Phosphatase: 57 U/L (ref 38–126)
Anion gap: 5 (ref 5–15)
BUN: 15 mg/dL (ref 6–20)
CO2: 25 mmol/L (ref 22–32)
Calcium: 9.2 mg/dL (ref 8.9–10.3)
Chloride: 107 mmol/L (ref 98–111)
Creatinine, Ser: 0.91 mg/dL (ref 0.44–1.00)
GFR, Estimated: >60 mL/min (ref >60)
Glucose, Bld: 120 mg/dL — ABNORMAL HIGH (ref 70–99)
Potassium: 3.8 mmol/L (ref 3.5–5.1)
Sodium: 137 mmol/L (ref 135–145)
Total Bilirubin: 0.5 mg/dL (ref 0.3–1.2)
Total Protein: 7.8 g/dL (ref 6.5–8.1)

## 2022-05-03 LAB — CBC WITH DIFFERENTIAL/PLATELET
Abs Immature Granulocytes: 0.02 10*3/uL (ref 0.00–0.07)
Basophils Absolute: 0.1 10*3/uL (ref 0.0–0.1)
Basophils Relative: 1 %
Eosinophils Absolute: 1.2 10*3/uL — ABNORMAL HIGH (ref 0.0–0.5)
Eosinophils Relative: 19 %
HCT: 35.5 % — ABNORMAL LOW (ref 36.0–46.0)
Hemoglobin: 11.3 g/dL — ABNORMAL LOW (ref 12.0–15.0)
Immature Granulocytes: 0 %
Lymphocytes Relative: 26 %
Lymphs Abs: 1.6 10*3/uL (ref 0.7–4.0)
MCH: 28.9 pg (ref 26.0–34.0)
MCHC: 31.8 g/dL (ref 30.0–36.0)
MCV: 90.8 fL (ref 80.0–100.0)
Monocytes Absolute: 0.5 10*3/uL (ref 0.1–1.0)
Monocytes Relative: 7 %
Neutro Abs: 2.9 10*3/uL (ref 1.7–7.7)
Neutrophils Relative %: 47 %
Platelets: 395 10*3/uL (ref 150–400)
RBC: 3.91 MIL/uL (ref 3.87–5.11)
RDW: 12.7 % (ref 11.5–15.5)
WBC: 6.2 10*3/uL (ref 4.0–10.5)
nRBC: 0 % (ref 0.0–0.2)

## 2022-05-03 LAB — TROPONIN I (HIGH SENSITIVITY): Troponin I (High Sensitivity): 9 ng/L (ref <18)

## 2022-05-03 MED ORDER — OXYCODONE-ACETAMINOPHEN 5-325 MG PO TABS
1.0000 | ORAL_TABLET | Freq: Once | ORAL | Status: AC
  Administered 2022-05-03: 1 via ORAL
  Filled 2022-05-03: qty 1

## 2022-05-03 MED ORDER — IOHEXOL 350 MG/ML SOLN
75.0000 mL | Freq: Once | INTRAVENOUS | Status: AC | PRN
  Administered 2022-05-03: 75 mL via INTRAVENOUS

## 2022-05-03 MED ORDER — MORPHINE SULFATE (PF) 4 MG/ML IV SOLN
4.0000 mg | Freq: Once | INTRAVENOUS | Status: AC
  Administered 2022-05-03: 4 mg via INTRAVENOUS
  Filled 2022-05-03: qty 1

## 2022-05-03 MED ORDER — OXYCODONE-ACETAMINOPHEN 5-325 MG PO TABS: 1.0000 | ORAL_TABLET | ORAL | 0 refills | Status: AC | PRN

## 2022-05-03 NOTE — ED Notes (Signed)
Called lab for pending trop and CMP results from 1318. They are checking.

## 2022-05-03 NOTE — ED Notes (Signed)
Lab will come draw troponin. IV not drawing back well. Pt is hard stick.
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
## 2022-05-03 NOTE — ED Notes (Signed)
IV flushes well but not drawing blood back well as it was when first inserted. Pt to CT now, will attempt blood draw when pt returns.
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
## 2022-05-03 NOTE — Discharge Instructions (Signed)
At this time your CT scan is not showing any injury to the aorta, large collection of blood or fluid, or any other severe injury.  Call your surgeon's office tomorrow and follow-up this week as scheduled.  You may take the Percocet as needed for pain.  Return to the ER immediately for new, worsening, persistent severe chest pain, back pain, difficulty breathing, weakness or lightheadedness or any other new or worsening symptoms that concern you.
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
## 2022-05-22 ENCOUNTER — Other Ambulatory Visit: Payer: Self-pay

## 2022-05-22 ENCOUNTER — Emergency Department: Payer: Medicaid Other

## 2022-05-22 ENCOUNTER — Encounter: Payer: Self-pay | Admitting: *Deleted

## 2022-05-22 DIAGNOSIS — I509 Heart failure, unspecified: Secondary | ICD-10-CM | POA: Insufficient documentation

## 2022-05-22 DIAGNOSIS — X509XXA Other and unspecified overexertion or strenuous movements or postures, initial encounter: Secondary | ICD-10-CM | POA: Insufficient documentation

## 2022-05-22 DIAGNOSIS — I110 Hypertensive heart disease with heart failure: Secondary | ICD-10-CM | POA: Diagnosis not present

## 2022-05-22 DIAGNOSIS — Y9301 Activity, walking, marching and hiking: Secondary | ICD-10-CM | POA: Diagnosis not present

## 2022-05-22 DIAGNOSIS — S29011A Strain of muscle and tendon of front wall of thorax, initial encounter: Secondary | ICD-10-CM | POA: Diagnosis not present

## 2022-05-22 DIAGNOSIS — Z7901 Long term (current) use of anticoagulants: Secondary | ICD-10-CM | POA: Diagnosis not present

## 2022-05-22 DIAGNOSIS — J45909 Unspecified asthma, uncomplicated: Secondary | ICD-10-CM | POA: Insufficient documentation

## 2022-05-22 DIAGNOSIS — S299XXA Unspecified injury of thorax, initial encounter: Secondary | ICD-10-CM | POA: Diagnosis present

## 2022-05-22 NOTE — ED Triage Notes (Deleted)
Pt presents via POV c/o sore throat and fever. Reports unrelieved with OTC meds.

## 2022-05-22 NOTE — ED Triage Notes (Signed)
Pt reports she walked the dog today and felt a pop in her incision and chest  CABG at [facility] 04/10/22.  Intermittent sob.  Pt alert  speech clear.

## 2022-05-23 ENCOUNTER — Emergency Department
Admission: EM | Admit: 2022-05-23 | Discharge: 2022-05-23 | Disposition: A | Discharge status: Home / Self Care | Payer: Medicaid Other | Attending: Emergency Medicine | Admitting: Emergency Medicine

## 2022-05-23 ENCOUNTER — Emergency Department: Payer: Medicaid Other

## 2022-05-23 DIAGNOSIS — S29011A Strain of muscle and tendon of front wall of thorax, initial encounter: Secondary | ICD-10-CM

## 2022-05-23 DIAGNOSIS — R0789 Other chest pain: Secondary | ICD-10-CM

## 2022-05-23 LAB — CBC
HCT: 36.8 % (ref 36.0–46.0)
Hemoglobin: 11.8 g/dL — ABNORMAL LOW (ref 12.0–15.0)
MCH: 28.9 pg (ref 26.0–34.0)
MCHC: 32.1 g/dL (ref 30.0–36.0)
MCV: 90 fL (ref 80.0–100.0)
Platelets: 343 10*3/uL (ref 150–400)
RBC: 4.09 MIL/uL (ref 3.87–5.11)
RDW: 13.7 % (ref 11.5–15.5)
WBC: 5 10*3/uL (ref 4.0–10.5)
nRBC: 0 % (ref 0.0–0.2)

## 2022-05-23 LAB — BASIC METABOLIC PANEL
Anion gap: 4 — ABNORMAL LOW (ref 5–15)
BUN: 13 mg/dL (ref 6–20)
CO2: 28 mmol/L (ref 22–32)
Calcium: 8.9 mg/dL (ref 8.9–10.3)
Chloride: 107 mmol/L (ref 98–111)
Creatinine, Ser: 0.82 mg/dL (ref 0.44–1.00)
GFR, Estimated: >60 mL/min (ref >60)
Glucose, Bld: 111 mg/dL — ABNORMAL HIGH (ref 70–99)
Potassium: 3.8 mmol/L (ref 3.5–5.1)
Sodium: 139 mmol/L (ref 135–145)

## 2022-05-23 LAB — PROTIME-INR
INR: 1.6 — ABNORMAL HIGH (ref 0.8–1.2)
Prothrombin Time: 19 seconds — ABNORMAL HIGH (ref 11.4–15.2)

## 2022-05-23 LAB — TROPONIN I (HIGH SENSITIVITY): Troponin I (High Sensitivity): 8 ng/L (ref <18)

## 2022-05-23 MED ORDER — WARFARIN - PHYSICIAN DOSING INPATIENT
Freq: Every day | Status: DC
  Filled 2022-05-23: qty 1

## 2022-05-23 MED ORDER — LIDOCAINE 5 % EX PTCH: 1.0000 | MEDICATED_PATCH | Freq: Two times a day (BID) | CUTANEOUS | 0 refills | Status: AC

## 2022-05-23 MED ORDER — WARFARIN SODIUM 10 MG PO TABS
10.0000 mg | ORAL_TABLET | Freq: Once | ORAL | Status: AC
  Administered 2022-05-23: 10 mg via ORAL
  Filled 2022-05-23: qty 1

## 2022-05-23 MED ORDER — OXYCODONE-ACETAMINOPHEN 5-325 MG PO TABS
1.0000 | ORAL_TABLET | Freq: Once | ORAL | Status: AC
  Administered 2022-05-23: 1 via ORAL
  Filled 2022-05-23: qty 1

## 2022-05-23 MED ORDER — LIDOCAINE 5 % EX PTCH
1.0000 | MEDICATED_PATCH | CUTANEOUS | Status: DC
  Administered 2022-05-23: 1 via TRANSDERMAL
  Filled 2022-05-23: qty 1

## 2022-05-23 NOTE — ED Provider Notes (Signed)
[facility] Provider Note    Event Date/Time   First MD Initiated Contact with Patient 05/23/22 0112     (approximate)   History   Chief Complaint Chest Pain   HPI  Sarah Sosa is a 41 y.o. female with past medical history of hypertension, aortic valve replacement on Coumadin, ascending aortic aneurysm repair, CHF, asthma, GERD, polysubstance abuse, and chronic pain syndrome who presents to the ED complaining of chest pain.  Patient reports that she was walking her dog earlier this evening when the dog suddenly pulled on the leash and violently tugged her arm.  She states that she felt a "pop" in the left side of her chest and had immediate onset of significant pain over her left chest wall.  She states that she had been having similar pain following her surgery, but it had been improving up until the injury this evening.  She denies any associated difficulty breathing and has not had any recent fevers or cough.  She states she has been compliant with her Coumadin.  She has not taken anything for pain prior to arrival.     Physical Exam   Triage Vital Signs: ED Triage Vitals  Enc Vitals Group     BP 05/22/22 2239 132/81     Pulse Rate 05/22/22 2239 84     Resp 05/22/22 2239 20     Temp 05/22/22 2239 98.8 F (37.1 C)     Temp Source 05/22/22 2239 Oral     SpO2 05/22/22 2239 97 %     Weight 05/22/22 2240 242 lb (109.8 kg)     Height 05/22/22 2240 '5\' 6"'$  (1.676 m)     Head Circumference --      Peak Flow --      Pain Score 05/22/22 2240 10     Pain Loc --      Pain Edu? --      Excl. in [place]? --     Most recent vital signs: Vitals:   05/23/22 0300 05/23/22 0330  BP: (!) 146/92   Pulse: 84 84  Resp: (!) 21 20  Temp:  99.3 F (37.4 C)  SpO2: 94% 99%    Constitutional: Alert and oriented. Eyes: Conjunctivae are normal. Head: Atraumatic. Nose: No congestion/rhinnorhea. Mouth/Throat: Mucous membranes are moist.  Cardiovascular: Normal rate,  regular rhythm. Grossly normal heart sounds.  2+ radial pulses bilaterally. Respiratory: Normal respiratory effort.  No retractions. Lungs CTAB.  Bilateral chest wall tenderness to palpation noted. Gastrointestinal: Soft and nontender. No distention. Musculoskeletal: No lower extremity tenderness nor edema.  Neurologic:  Normal speech and language. No gross focal neurologic deficits are appreciated.    ED Results / Procedures / Treatments   Labs (all labs ordered are listed, but only abnormal results are displayed) Labs Reviewed  CBC - Abnormal; Notable for the following components:      Result Value   Hemoglobin 11.8 (*)    All other components within normal limits  PROTIME-INR - Abnormal; Notable for the following components:   Prothrombin Time 19.0 (*)    INR 1.6 (*)    All other components within normal limits  BASIC METABOLIC PANEL - Abnormal; Notable for the following components:   Glucose, Bld 111 (*)    Anion gap 4 (*)    All other components within normal limits  TROPONIN I (HIGH SENSITIVITY)     EKG  ED ECG REPORT I, Blake Divine, the attending physician, personally viewed and interpreted this  ECG.   Date: 05/23/2022  EKG Time: 22:46  Rate: 87  Rhythm: normal sinus rhythm  Axis: Normal  Intervals:none  ST&T Change: Inferior T wave inversions, similar to previous  RADIOLOGY Chest x-ray reviewed and interpreted by me with no infiltrate, edema, or effusion.  PROCEDURES:  Critical Care performed: No  Procedures   MEDICATIONS ORDERED IN ED: Medications  lidocaine (LIDODERM) 5 % 1 patch (1 patch Transdermal Patch Applied 05/23/22 0209)  Warfarin - Physician Dosing Inpatient (has no administration in time range)  oxyCODONE-acetaminophen (PERCOCET/ROXICET) 5-325 MG per tablet 1 tablet (1 tablet Oral Given 05/23/22 0209)  warfarin (COUMADIN) tablet 10 mg (10 mg Oral Given 05/23/22 0341)     IMPRESSION / MDM / ASSESSMENT AND PLAN / ED COURSE  I reviewed  the triage vital signs and the nursing notes.                              41 y.o. female with past medical history of hypertension, aortic valve replacement, ascending aortic aneurysm repair, CHF, asthma, GERD, polysubstance use, and chronic pain syndrome who presents to the ED complaining of increasing chest pain after her dog tugged her arm earlier this evening.  Patient's presentation is most consistent with acute presentation with potential threat to life or bodily function.  Differential diagnosis includes, but is not limited to, ACS, PE, aortic dissection, muscle strain, chronic pain syndrome, pneumonia, rib fracture.  Patient nontoxic-appearing and in no acute distress, vital signs are unremarkable.  EKG shows no evidence of arrhythmia or ischemia and I have low suspicion for ACS given her atypical symptoms.  Her pain is reproduced with palpation of her chest wall and appears to be an acute on chronic issue following her open heart surgery in September.  Symptoms seem musculoskeletal in etiology and I do not feel CT imaging is indicated, chest x-ray is unremarkable with no evidence of bony injury.  We will screen labs and treat symptomatically with Lidoderm patch and Percocet, reassess following results.  Labs are overall reassuring with no significant anemia, leukocytosis, electrolyte abnormality, or AKI.  Troponin within normal limits and with her atypical symptoms I doubt ACS.  Patient does have subtherapeutic INR at 1.6, which was discussed with pharmacist.  Patient usually takes 7.5 mg of Coumadin and we will give a 10 mg dose here in the ED for catch-up, after which she will need to follow-up closely for recheck of her INR.  Suspect musculoskeletal etiology for her pain, she was counseled to return to the ED for new or worsening symptoms, patient agrees with plan.      FINAL CLINICAL IMPRESSION(S) / ED DIAGNOSES   Final diagnoses:  Atypical chest pain  Pectoralis muscle strain,  initial encounter     Rx / DC Orders   ED Discharge Orders          Ordered    lidocaine (LIDODERM) 5 %  Every 12 hours        05/23/22 0434             Note:  This document was prepared using Dragon voice recognition software and may include unintentional dictation errors.   Blake Divine, MD 05/23/22 [phone]

## 2022-05-23 NOTE — ED Notes (Signed)
Lab called and was unable to get blood.

## 2022-05-23 NOTE — ED Notes (Signed)
Pt received discharge papers and verbalized understanding of discharge instructions. IV removed. Pt ambulates to exit with steady gait. Pt's family member waiting outside for pt as per pt

## 2022-05-23 NOTE — ED Notes (Signed)
IV team at bedside 

## 2022-05-23 NOTE — ED Notes (Signed)
Chart reviewed; lab orders noted still pending; triage nurse reports unable to obtain labs in triage and lab tech attempted several times as well; will take pt to next available exam room for IV team consult

## 2022-06-02 DIAGNOSIS — Z79891 Long term (current) use of opiate analgesic: Secondary | ICD-10-CM | POA: Diagnosis present

## 2022-07-12 ENCOUNTER — Emergency Department
Admission: EM | Admit: 2022-07-12 | Discharge: 2022-07-12 | Discharge status: Against Medical Advice | Payer: Medicaid Other | Attending: Emergency Medicine | Admitting: Emergency Medicine

## 2022-07-12 ENCOUNTER — Other Ambulatory Visit: Payer: Self-pay

## 2022-07-12 DIAGNOSIS — R109 Unspecified abdominal pain: Secondary | ICD-10-CM | POA: Insufficient documentation

## 2022-07-12 DIAGNOSIS — R112 Nausea with vomiting, unspecified: Secondary | ICD-10-CM | POA: Diagnosis present

## 2022-07-12 DIAGNOSIS — Z5321 Procedure and treatment not carried out due to patient leaving prior to being seen by health care provider: Secondary | ICD-10-CM | POA: Insufficient documentation

## 2022-07-12 LAB — COMPREHENSIVE METABOLIC PANEL
ALT: 16 U/L (ref 0–44)
AST: 21 U/L (ref 15–41)
Albumin: 4.3 g/dL (ref 3.5–5.0)
Alkaline Phosphatase: 77 U/L (ref 38–126)
Anion gap: 10 (ref 5–15)
BUN: 12 mg/dL (ref 6–20)
CO2: 25 mmol/L (ref 22–32)
Calcium: 9.4 mg/dL (ref 8.9–10.3)
Chloride: 103 mmol/L (ref 98–111)
Creatinine, Ser: 0.94 mg/dL (ref 0.44–1.00)
GFR, Estimated: >60 mL/min (ref >60)
Glucose, Bld: 138 mg/dL — ABNORMAL HIGH (ref 70–99)
Potassium: 3.9 mmol/L (ref 3.5–5.1)
Sodium: 138 mmol/L (ref 135–145)
Total Bilirubin: 0.5 mg/dL (ref 0.3–1.2)
Total Protein: 8.6 g/dL — ABNORMAL HIGH (ref 6.5–8.1)

## 2022-07-12 LAB — CBC
HCT: 43.6 % (ref 36.0–46.0)
Hemoglobin: 13.9 g/dL (ref 12.0–15.0)
MCH: 28.4 pg (ref 26.0–34.0)
MCHC: 31.9 g/dL (ref 30.0–36.0)
MCV: 89 fL (ref 80.0–100.0)
Platelets: 313 10*3/uL (ref 150–400)
RBC: 4.9 MIL/uL (ref 3.87–5.11)
RDW: 13.7 % (ref 11.5–15.5)
WBC: 5.5 10*3/uL (ref 4.0–10.5)
nRBC: 0 % (ref 0.0–0.2)

## 2022-07-12 LAB — APTT: aPTT: 33 seconds (ref 24–36)

## 2022-07-12 LAB — TROPONIN I (HIGH SENSITIVITY): Troponin I (High Sensitivity): 10 ng/L (ref <18)

## 2022-07-12 LAB — LIPASE, BLOOD: Lipase: 26 U/L (ref 11–51)

## 2022-07-12 MED ORDER — ONDANSETRON 4 MG PO TBDP
4.0000 mg | ORAL_TABLET | Freq: Once | ORAL | Status: AC
  Administered 2022-07-12: 4 mg via ORAL
  Filled 2022-07-12: qty 1

## 2022-07-12 NOTE — ED Triage Notes (Signed)
Pt here with abd pain and a hx of pancreatitis. Pt had open heart surgery on 05/10/22 at [facility]. Pt states her pancreas pain is radiating up to her back. Pt states nausea and vomiting.

## 2022-07-12 NOTE — ED Provider Triage Note (Signed)
Emergency Medicine Provider Triage Evaluation Note  Sarah Sosa , a 41 y.o. female  was evaluated in triage.  Pt complains of abdominal pain from chronic pancreatitis and occasionally flares.  Patient also had open heart surgery at [facility] and states that her chest still hurts from surgery.  Review of Systems  Positive: Positive nausea and vomiting. Negative: No diarrhea.  Physical Exam  Ht '5\' 6"'$  (1.676 m)   Wt 109.8 kg   LMP 03/25/2018 (Exact Date)   BMI 39.07 kg/m  Gen:   Awake, no distress   Resp:  Normal effort anterior chest wall scar appears to be healing well from surgery. MSK:   Moves extremities without difficulty  Other:  Soft, left upper quadrant tenderness and to the epigastric area.  Bowel sounds are present.  Medical Decision Making  Medically screening exam initiated at 1:19 PM.  Appropriate orders placed.  Sarah Sosa was informed that the remainder of the evaluation will be completed by another provider, this initial triage assessment does not replace that evaluation, and the importance of remaining in the ED until their evaluation is complete.     Johnn Hai, PA-C 07/12/22 1322

## 2022-08-24 ENCOUNTER — Emergency Department: Payer: Medicaid Other

## 2022-08-24 ENCOUNTER — Emergency Department
Admission: EM | Admit: 2022-08-24 | Discharge: 2022-08-24 | Disposition: A | Discharge status: Home / Self Care | Payer: Medicaid Other | Attending: Emergency Medicine | Admitting: Emergency Medicine

## 2022-08-24 ENCOUNTER — Other Ambulatory Visit: Payer: Self-pay

## 2022-08-24 DIAGNOSIS — J45909 Unspecified asthma, uncomplicated: Secondary | ICD-10-CM | POA: Insufficient documentation

## 2022-08-24 DIAGNOSIS — I509 Heart failure, unspecified: Secondary | ICD-10-CM | POA: Diagnosis not present

## 2022-08-24 DIAGNOSIS — R1012 Left upper quadrant pain: Secondary | ICD-10-CM | POA: Insufficient documentation

## 2022-08-24 DIAGNOSIS — R1110 Vomiting, unspecified: Secondary | ICD-10-CM | POA: Diagnosis not present

## 2022-08-24 DIAGNOSIS — Z7901 Long term (current) use of anticoagulants: Secondary | ICD-10-CM | POA: Diagnosis not present

## 2022-08-24 DIAGNOSIS — I110 Hypertensive heart disease with heart failure: Secondary | ICD-10-CM | POA: Insufficient documentation

## 2022-08-24 DIAGNOSIS — R0789 Other chest pain: Secondary | ICD-10-CM | POA: Diagnosis not present

## 2022-08-24 DIAGNOSIS — R109 Unspecified abdominal pain: Secondary | ICD-10-CM

## 2022-08-24 HISTORY — DX: Nausea with vomiting, unspecified: R11.2

## 2022-08-24 LAB — COMPREHENSIVE METABOLIC PANEL
ALT: 12 U/L (ref 0–44)
AST: 16 U/L (ref 15–41)
Albumin: 3.7 g/dL (ref 3.5–5.0)
Alkaline Phosphatase: 70 U/L (ref 38–126)
Anion gap: 9 (ref 5–15)
BUN: 13 mg/dL (ref 6–20)
CO2: 22 mmol/L (ref 22–32)
Calcium: 9 mg/dL (ref 8.9–10.3)
Chloride: 102 mmol/L (ref 98–111)
Creatinine, Ser: 0.72 mg/dL (ref 0.44–1.00)
GFR, Estimated: >60 mL/min (ref >60)
Glucose, Bld: 97 mg/dL (ref 70–99)
Potassium: 3.6 mmol/L (ref 3.5–5.1)
Sodium: 133 mmol/L — ABNORMAL LOW (ref 135–145)
Total Bilirubin: 0.6 mg/dL (ref 0.3–1.2)
Total Protein: 7.5 g/dL (ref 6.5–8.1)

## 2022-08-24 LAB — TROPONIN I (HIGH SENSITIVITY)
Troponin I (High Sensitivity): 8 ng/L (ref <18)
Troponin I (High Sensitivity): 11 ng/L (ref <18)

## 2022-08-24 LAB — CBC
HCT: 40.6 % (ref 36.0–46.0)
Hemoglobin: 13.4 g/dL (ref 12.0–15.0)
MCH: 29.2 pg (ref 26.0–34.0)
MCHC: 33 g/dL (ref 30.0–36.0)
MCV: 88.5 fL (ref 80.0–100.0)
Platelets: 258 10*3/uL (ref 150–400)
RBC: 4.59 MIL/uL (ref 3.87–5.11)
RDW: 13.8 % (ref 11.5–15.5)
WBC: 6 10*3/uL (ref 4.0–10.5)
nRBC: 0 % (ref 0.0–0.2)

## 2022-08-24 LAB — LIPASE, BLOOD: Lipase: 24 U/L (ref 11–51)

## 2022-08-24 MED ORDER — SODIUM CHLORIDE 0.9 % IV BOLUS: 500.0000 mL | Freq: Once | INTRAVENOUS | Status: DC

## 2022-08-24 MED ORDER — ONDANSETRON HCL 4 MG/2ML IJ SOLN
4.0000 mg | Freq: Once | INTRAMUSCULAR | Status: AC
  Administered 2022-08-24: 4 mg via INTRAVENOUS
  Filled 2022-08-24: qty 2

## 2022-08-24 MED ORDER — IOHEXOL 350 MG/ML SOLN
100.0000 mL | Freq: Once | INTRAVENOUS | Status: AC | PRN
  Administered 2022-08-24: 100 mL via INTRAVENOUS

## 2022-08-24 MED ORDER — MORPHINE SULFATE (PF) 4 MG/ML IV SOLN
4.0000 mg | Freq: Once | INTRAVENOUS | Status: AC
  Administered 2022-08-24: 4 mg via INTRAVENOUS
  Filled 2022-08-24 (×2): qty 1

## 2022-08-24 MED ORDER — POLYETHYLENE GLYCOL 3350 17 GM/SCOOP PO POWD: 1.0000 | Freq: Once | ORAL | 0 refills | Status: AC

## 2022-08-24 MED ORDER — BISACODYL 5 MG PO TBEC: 5.0000 mg | DELAYED_RELEASE_TABLET | Freq: Every day | ORAL | 1 refills | Status: DC | PRN

## 2022-08-24 NOTE — Discharge Instructions (Addendum)
4 (5 mg) tablets of bisacodyl (Dulcolax). These are usually sold as a box of 10 tablets. 1 (238-gram) bottle of polyethylene glycol (MiraLAX ). 64 ounces of any clear liquid that isn't red, purple, or orange. You will need to mix this with the MiraLAX. Keep it at room temperature. A sports drink like Gatorade or Powerade is a good choice. Sports drinks will help replace electrolytes that you will lose during the bowel preparation. If you have diabetes, be sure to get sugar-free clear liquids.

## 2022-08-24 NOTE — ED Provider Notes (Signed)
[facility] Provider Note  Patient Contact: 6:00 PM (approximate)   History   Abdominal Pain   HPI  Sarah Sosa is a 42 y.o. female with a history of hypertension, aortic valve replacement on Coumadin, ascending aortic aneurysm repair, CHF, asthma, GERD, polysubstance abuse and chronic pain syndrome, presents to the emergency with left upper quadrant abdominal pain that radiates to her back and her anterior chest.  Patient states that she has had multiple episodes of vomiting but no diarrhea.  She states that her pain feels similar to prior instances of pancreatitis.  Patient is status post mechanical Weat procedure for ascending aneurysm on 04/10/2022.  She denies current chest tightness or shortness of breath.No fever.       Physical Exam   Triage Vital Signs: ED Triage Vitals  Enc Vitals Group     BP 08/24/22 1617 119/71     Pulse Rate 08/24/22 1617 93     Resp 08/24/22 1617 19     Temp 08/24/22 1617 98.3 F (36.8 C)     Temp src --      SpO2 08/24/22 1617 99 %     Weight 08/24/22 1614 265 lb (120.2 kg)     Height 08/24/22 1614 5' 6.5" (1.689 m)     Head Circumference --      Peak Flow --      Pain Score 08/24/22 1613 10     Pain Loc --      Pain Edu? --      Excl. in [place]? --     Most recent vital signs: Vitals:   08/24/22 1936 08/24/22 2029  BP: (!) 158/110   Pulse: 60   Resp: 18   Temp:  98.3 F (36.8 C)  SpO2: 100%      General: Alert and in no acute distress. Eyes:  PERRL. EOMI. Head: No acute traumatic findings ENT:      Nose: No congestion/rhinnorhea.      Mouth/Throat: Mucous membranes are moist. Neck: No stridor. No cervical spine tenderness to palpation. Cardiovascular:  Good peripheral perfusion Respiratory: Normal respiratory effort without tachypnea or retractions. Lungs CTAB. Good air entry to the bases with no decreased or absent breath sounds. Gastrointestinal: Bowel sounds 4 quadrants. Soft and tender to  palpation in LUQ. Patient has guarding rigidity. No palpable masses. No distention. No CVA tenderness. Musculoskeletal: Full range of motion to all extremities.  Neurologic:  No gross focal neurologic deficits are appreciated.  Skin:   No rash noted Other:   ED Results / Procedures / Treatments   Labs (all labs ordered are listed, but only abnormal results are displayed) Labs Reviewed  COMPREHENSIVE METABOLIC PANEL - Abnormal; Notable for the following components:      Result Value   Sodium 133 (*)    All other components within normal limits  LIPASE, BLOOD  CBC  URINALYSIS, ROUTINE W REFLEX MICROSCOPIC  POC URINE PREG, ED  TROPONIN I (HIGH SENSITIVITY)  TROPONIN I (HIGH SENSITIVITY)     EKG  Normal sinus rhythm with T wave inversion.   RADIOLOGY  I personally viewed and evaluated these images as part of my medical decision making, as well as reviewing the written report by the radiologist.  ED Provider Interpretation: CT angio chest, abdomen pelvis shows no acute abnormality.   PROCEDURES:  Critical Care performed: No  Procedures   MEDICATIONS ORDERED IN ED: Medications  morphine (PF) 4 MG/ML injection 4 mg (4 mg Intravenous Given 08/24/22  1929)  ondansetron (ZOFRAN) injection 4 mg (4 mg Intravenous Given 08/24/22 1929)  iohexol (OMNIPAQUE) 350 MG/ML injection 100 mL (100 mLs Intravenous Contrast Given 08/24/22 1944)     IMPRESSION / MDM / ASSESSMENT AND PLAN / ED COURSE  I reviewed the triage vital signs and the nursing notes.                              Assessment and plan:  Chest pain: Abdominal pain:   42 year old female presents to the emergency department with left upper quadrant abdominal pain, vomiting and radiating pain to the chest.  EKG indicates normal sinus rhythm with T wave inversion.  Delta troponin within range.  Lipase within range.  CBC and CMP reassuring.  CT angio chest, abdomen and pelvis shows no acute abnormality, in particular no  stranding or inflammation around the pancreas to suggest pancreatitis.  CT did indicate findings concerning for constipation.  Patient and I discussed a bowel cleanout as patient states that she has struggled with constipation since taking Percocet chronically.  Instructions for bowel cleanout were included in patient's discharge paperwork.  Overall, patient stated that she felt improved since waiting in the emergency department and felt comfortable being discharged.  Return precautions were given to return with new or worsening symptoms.   FINAL CLINICAL IMPRESSION(S) / ED DIAGNOSES   Final diagnoses:  Abdominal pain, unspecified abdominal location     Rx / DC Orders   ED Discharge Orders          Ordered    bisacodyl (DULCOLAX) 5 MG EC tablet  Daily PRN        08/24/22 2049    polyethylene glycol powder (GLYCOLAX/MIRALAX) 17 GM/SCOOP powder   Once        08/24/22 2049             Note:  This document was prepared using Dragon voice recognition software and may include unintentional dictation errors.   Vallarie Mare [place], Hershal Coria 08/24/22 2123    Lucillie Garfinkel, MD 08/25/22 [phone]

## 2022-08-24 NOTE — ED Triage Notes (Signed)
Pt c/o left upper abdominal pain, nausea, and vomiting since Tuesday. Pt states it feels like it may be pancreatitis again. Pt does reports she had open heart surgery this past September for a heart murmur. States she has had constant chest discomfort since but reports the abdominal pain has exacerbated her chest discomfort

## 2022-11-14 ENCOUNTER — Encounter: Payer: Self-pay | Admitting: Emergency Medicine

## 2022-11-14 ENCOUNTER — Other Ambulatory Visit: Payer: Self-pay

## 2022-11-14 DIAGNOSIS — K861 Other chronic pancreatitis: Secondary | ICD-10-CM | POA: Insufficient documentation

## 2022-11-14 DIAGNOSIS — R109 Unspecified abdominal pain: Secondary | ICD-10-CM | POA: Diagnosis present

## 2022-11-14 DIAGNOSIS — E876 Hypokalemia: Secondary | ICD-10-CM | POA: Insufficient documentation

## 2022-11-14 DIAGNOSIS — K279 Peptic ulcer, site unspecified, unspecified as acute or chronic, without hemorrhage or perforation: Secondary | ICD-10-CM | POA: Diagnosis not present

## 2022-11-14 DIAGNOSIS — Z7901 Long term (current) use of anticoagulants: Secondary | ICD-10-CM | POA: Diagnosis not present

## 2022-11-14 LAB — LIPASE, BLOOD: Lipase: 22 U/L (ref 11–51)

## 2022-11-14 LAB — COMPREHENSIVE METABOLIC PANEL
ALT: 13 U/L (ref 0–44)
AST: 21 U/L (ref 15–41)
Albumin: 3.6 g/dL (ref 3.5–5.0)
Alkaline Phosphatase: 75 U/L (ref 38–126)
Anion gap: 9 (ref 5–15)
BUN: 8 mg/dL (ref 6–20)
CO2: 24 mmol/L (ref 22–32)
Calcium: 8.5 mg/dL — ABNORMAL LOW (ref 8.9–10.3)
Chloride: 101 mmol/L (ref 98–111)
Creatinine, Ser: 0.78 mg/dL (ref 0.44–1.00)
GFR, Estimated: >60 mL/min (ref >60)
Glucose, Bld: 108 mg/dL — ABNORMAL HIGH (ref 70–99)
Potassium: 3.2 mmol/L — ABNORMAL LOW (ref 3.5–5.1)
Sodium: 134 mmol/L — ABNORMAL LOW (ref 135–145)
Total Bilirubin: 0.6 mg/dL (ref 0.3–1.2)
Total Protein: 7 g/dL (ref 6.5–8.1)

## 2022-11-14 LAB — CBC
HCT: 46.4 % — ABNORMAL HIGH (ref 36.0–46.0)
Hemoglobin: 14.2 g/dL (ref 12.0–15.0)
MCH: 29.3 pg (ref 26.0–34.0)
MCHC: 30.6 g/dL (ref 30.0–36.0)
MCV: 95.7 fL (ref 80.0–100.0)
Platelets: 266 10*3/uL (ref 150–400)
RBC: 4.85 MIL/uL (ref 3.87–5.11)
RDW: 13.6 % (ref 11.5–15.5)
WBC: 5.9 10*3/uL (ref 4.0–10.5)
nRBC: 0 % (ref 0.0–0.2)

## 2022-11-14 NOTE — ED Notes (Addendum)
EDT notified of blood draw unsuccessful; this nurse attempted lab draw to rt hand without success; pt tolerated well; lab called for blood draw assistance

## 2022-11-14 NOTE — ED Notes (Signed)
Attempted to draw basic labs from PT left hand, PT sat well.Could not complete, notified First Nurse.

## 2022-11-14 NOTE — ED Triage Notes (Signed)
Pt arrived via POV with reports of diarrhea, vomiting and LUQ pain that radiates to her back. Pt reports hx of pancreatitis and states feels the same. Pt states sxs began 4 days ago and has been unable to keep anything down today.  Pt reports diarrhea, but has been taking imodium to help with sxs.

## 2022-11-15 ENCOUNTER — Emergency Department: Payer: Medicaid Other

## 2022-11-15 ENCOUNTER — Emergency Department
Admission: EM | Admit: 2022-11-15 | Discharge: 2022-11-15 | Disposition: A | Discharge status: Home / Self Care | Payer: Medicaid Other | Attending: Emergency Medicine | Admitting: Emergency Medicine

## 2022-11-15 DIAGNOSIS — K279 Peptic ulcer, site unspecified, unspecified as acute or chronic, without hemorrhage or perforation: Secondary | ICD-10-CM

## 2022-11-15 DIAGNOSIS — R1013 Epigastric pain: Secondary | ICD-10-CM

## 2022-11-15 LAB — PROTIME-INR
INR: 2.3 — ABNORMAL HIGH (ref 0.8–1.2)
Prothrombin Time: 25 seconds — ABNORMAL HIGH (ref 11.4–15.2)

## 2022-11-15 LAB — MAGNESIUM: Magnesium: 1.3 mg/dL — ABNORMAL LOW (ref 1.7–2.4)

## 2022-11-15 MED ORDER — IOHEXOL 350 MG/ML SOLN
100.0000 mL | Freq: Once | INTRAVENOUS | Status: AC | PRN
  Administered 2022-11-15: 100 mL via INTRAVENOUS

## 2022-11-15 MED ORDER — SUCRALFATE 1 GM/10ML PO SUSP: 1.0000 g | Freq: Four times a day (QID) | ORAL | 1 refills | Status: DC

## 2022-11-15 MED ORDER — HYDROMORPHONE HCL 1 MG/ML IJ SOLN
1.0000 mg | Freq: Once | INTRAMUSCULAR | Status: AC
  Administered 2022-11-15: 1 mg via INTRAVENOUS
  Filled 2022-11-15: qty 1

## 2022-11-15 MED ORDER — SUCRALFATE 1 G PO TABS
1.0000 g | ORAL_TABLET | Freq: Once | ORAL | Status: DC
  Filled 2022-11-15: qty 1

## 2022-11-15 MED ORDER — ONDANSETRON HCL 4 MG/2ML IJ SOLN
4.0000 mg | Freq: Once | INTRAMUSCULAR | Status: AC
  Administered 2022-11-15: 4 mg via INTRAVENOUS
  Filled 2022-11-15: qty 2

## 2022-11-15 MED ORDER — ALUM & MAG HYDROXIDE-SIMETH 200-200-20 MG/5ML PO SUSP
30.0000 mL | Freq: Once | ORAL | Status: AC
  Administered 2022-11-15: 30 mL via ORAL
  Filled 2022-11-15: qty 30

## 2022-11-15 MED ORDER — LACTATED RINGERS IV BOLUS
1000.0000 mL | Freq: Once | INTRAVENOUS | Status: AC
  Administered 2022-11-15: 1000 mL via INTRAVENOUS

## 2022-11-15 NOTE — ED Provider Notes (Signed)
[facility] Provider Note    Event Date/Time   First MD Initiated Contact with Patient 11/15/22 0110     (approximate)   History   Abdominal Pain and Emesis   HPI  Anntoinette Haefele is a 42 y.o. female who presents to the ED for evaluation of Abdominal Pain and Emesis   I reviewed [facility] visit from December.  History of AVR and ascending aortic repair, recurrent pancreatitis, alcoholism and polysubstance abuse.  Chronic pain syndrome.  DM.  Anticoagulated on Coumadin  Patient presents to the ED for evaluation of "my pancreatitis."  She reports a degree of chronic abdominal pain that has been worse in the past 3 to 4 days, consistent with her previous experiences of acute pancreatitis flares.  Reports emesis alongside it without hematemesis or melena.  She reports chronic chest pain without acute changes or any acute changes to her respiratory status.  No fevers or syncope.  Physical Exam   Triage Vital Signs: ED Triage Vitals  Enc Vitals Group     BP 11/14/22 2114 (!) 132/100     Pulse Rate 11/14/22 2114 91     Resp 11/14/22 2114 18     Temp 11/14/22 2114 98.3 F (36.8 C)     Temp Source 11/14/22 2114 Oral     SpO2 11/14/22 2114 96 %     Weight 11/14/22 2114 252 lb (114.3 kg)     Height 11/14/22 2114 5' 6.5" (1.689 m)     Head Circumference --      Peak Flow --      Pain Score 11/14/22 2112 9     Pain Loc --      Pain Edu? --      Excl. in [place]? --     Most recent vital signs: Vitals:   11/15/22 0300 11/15/22 0408  BP: (!) 126/93 124/80  Pulse: 74 70  Resp: 18 18  Temp:    SpO2: 96% 97%    General: Awake, no distress.  CV:  Good peripheral perfusion.  Resp:  Normal effort.  Abd:  No distention.  Epigastric tenderness without peritoneal features.  Abdomen otherwise is benign. MSK:  No deformity noted.  Neuro:  No focal deficits appreciated. Other:     ED Results / Procedures / Treatments   Labs (all labs ordered are listed, but  only abnormal results are displayed) Labs Reviewed  COMPREHENSIVE METABOLIC PANEL - Abnormal; Notable for the following components:      Result Value   Sodium 134 (*)    Potassium 3.2 (*)    Glucose, Bld 108 (*)    Calcium 8.5 (*)    All other components within normal limits  CBC - Abnormal; Notable for the following components:   HCT 46.4 (*)    All other components within normal limits  PROTIME-INR - Abnormal; Notable for the following components:   Prothrombin Time 25.0 (*)    INR 2.3 (*)    All other components within normal limits  MAGNESIUM - Abnormal; Notable for the following components:   Magnesium 1.3 (*)    All other components within normal limits  LIPASE, BLOOD  URINALYSIS, ROUTINE W REFLEX MICROSCOPIC    EKG Sinus rhythm with a rate of 89 bpm.  Normal axis and intervals.  Nonspecific ST changes to inferior leads without STEMI. Comparison from February is similar  RADIOLOGY CT abdomen/pelvis interpreted by me without evidence of acute pathology  Official radiology report(s): CT ABDOMEN PELVIS  W CONTRAST  Result Date: 11/15/2022 CLINICAL DATA:  Epigastric pain with emesis EXAM: CT ABDOMEN AND PELVIS WITH CONTRAST TECHNIQUE: Multidetector CT imaging of the abdomen and pelvis was performed using the standard protocol following bolus administration of intravenous contrast. RADIATION DOSE REDUCTION: This exam was performed according to the departmental dose-optimization program which includes automated exposure control, adjustment of the mA and/or kV according to patient size and/or use of iterative reconstruction technique. CONTRAST:  OMNIPAQUE IOHEXOL 350 MG/ML SOLN COMPARISON:  CT 02/27/2022, 02/09/2022, 08/24/2022 FINDINGS: Lower chest: Lung bases show no acute airspace disease. Cardiomegaly. Hepatobiliary: No focal liver abnormality is seen. Status post cholecystectomy. No biliary dilatation. Pancreas: Unremarkable. No pancreatic ductal dilatation or surrounding  inflammatory changes. Spleen: Normal in size without focal abnormality. Adrenals/Urinary Tract: Adrenal glands are unremarkable. Kidneys are normal, without renal calculi, focal lesion, or hydronephrosis. Bladder is unremarkable. Stomach/Bowel: The stomach is nonenlarged. There is possible mild wall thickening of the pylorus. No dilated small bowel. No acute bowel wall thickening. Vascular/Lymphatic: Moderate aortic atherosclerosis. No aneurysm. No suspicious lymph nodes. Slight swirling appearance of lower mesentery and vasculature but chronic finding. Reproductive: Status post hysterectomy. No adnexal masses. Other: Negative for pelvic effusion or free air Musculoskeletal: No acute or suspicious osseous abnormality. IMPRESSION: 1. No definite CT evidence for acute intra-abdominal or pelvic abnormality. 2. Possible mild wall thickening of the pylorus, could be secondary to gastritis or peptic ulcer disease. 3. Cardiomegaly. 4. Aortic atherosclerosis. Aortic Atherosclerosis (ICD10-I70.0). Electronically Signed   By: Jasmine Pang M.D.   On: 11/15/2022 03:10    PROCEDURES and INTERVENTIONS:  .1-3 Lead EKG Interpretation  Performed by: Delton Prairie, MD Authorized by: Delton Prairie, MD     Interpretation: normal     ECG rate:  80   ECG rate assessment: normal     Rhythm: sinus rhythm     Ectopy: none     Conduction: normal     Medications  sucralfate (CARAFATE) tablet 1 g (1 g Oral Not Given 11/15/22 0335)  HYDROmorphone (DILAUDID) injection 1 mg (1 mg Intravenous Given 11/15/22 0238)  lactated ringers bolus 1,000 mL (0 mLs Intravenous Stopped 11/15/22 0410)  ondansetron (ZOFRAN) injection 4 mg (4 mg Intravenous Given 11/15/22 0238)  iohexol (OMNIPAQUE) 350 MG/ML injection 100 mL (100 mLs Intravenous Contrast Given 11/15/22 0245)  alum & mag hydroxide-simeth (MAALOX/MYLANTA) 200-200-20 MG/5ML suspension 30 mL (30 mLs Oral Given 11/15/22 0335)     IMPRESSION / MDM / ASSESSMENT AND PLAN / ED COURSE  I  reviewed the triage vital signs and the nursing notes.  Differential diagnosis includes, but is not limited to, ACS, aortic dissection, pancreatitis, PUD or gastritis, opiate withdrawals, mesenteric ischemia  {Patient presents with symptoms of an acute illness or injury that is potentially life-threatening.  42 year old with chronic pain syndrome and chronic pancreatitis presents with acute on chronic abdominal pain, possibly related to GERD or PUD, ultimately suitable for outpatient management.  Has isolated epigastric tenderness.  Reassuring blood work with a normal CBC, lipase and essentially normal metabolic panel.  Marginal hypokalemia is noted.  INR is appropriately elevated in the setting of her Coumadin.  CT without clear signs of acute pathology, but questions PUD.  She does report a history of GERD and PUD on PPI and she has recently run out of her sucralfate.  Her pain is controlled, she is tolerating p.o. and she is suitable for outpatient management with a refill of her sucralfate.  Clinical Course as of 11/15/22 0529  Wed  Nov 15, 2022  0216 Chronic pancreatitis.  Worse for the past 3 days [DS]  0321 Reassessed, feeling better. She's apprecdiated. Asking about going home [DS]    Clinical Course User Index [DS] Delton Prairie, MD     FINAL CLINICAL IMPRESSION(S) / ED DIAGNOSES   Final diagnoses:  Epigastric pain  PUD (peptic ulcer disease)     Rx / DC Orders   ED Discharge Orders          Ordered    sucralfate (CARAFATE) 1 GM/10ML suspension  4 times daily        11/15/22 0324             Note:  This document was prepared using Dragon voice recognition software and may include unintentional dictation errors.   Delton Prairie, MD 11/15/22 [phone]

## 2023-02-10 ENCOUNTER — Other Ambulatory Visit: Payer: Self-pay

## 2023-02-10 ENCOUNTER — Emergency Department: Payer: Medicaid Other

## 2023-02-10 ENCOUNTER — Emergency Department
Admission: EM | Admit: 2023-02-10 | Discharge: 2023-02-10 | Disposition: A | Discharge status: Home / Self Care | Payer: Medicaid Other | Attending: Emergency Medicine | Admitting: Emergency Medicine

## 2023-02-10 DIAGNOSIS — R0789 Other chest pain: Secondary | ICD-10-CM | POA: Insufficient documentation

## 2023-02-10 DIAGNOSIS — W501XXA Accidental kick by another person, initial encounter: Secondary | ICD-10-CM | POA: Diagnosis not present

## 2023-02-10 DIAGNOSIS — Y9389 Activity, other specified: Secondary | ICD-10-CM | POA: Insufficient documentation

## 2023-02-10 LAB — CBC
HCT: 40.9 % (ref 36.0–46.0)
Hemoglobin: 13.4 g/dL (ref 12.0–15.0)
MCH: 29.6 pg (ref 26.0–34.0)
MCHC: 32.8 g/dL (ref 30.0–36.0)
MCV: 90.5 fL (ref 80.0–100.0)
Platelets: 293 10*3/uL (ref 150–400)
RBC: 4.52 MIL/uL (ref 3.87–5.11)
RDW: 12.9 % (ref 11.5–15.5)
WBC: 6.1 10*3/uL (ref 4.0–10.5)
nRBC: 0 % (ref 0.0–0.2)

## 2023-02-10 LAB — BASIC METABOLIC PANEL
Anion gap: 8 (ref 5–15)
BUN: 9 mg/dL (ref 6–20)
CO2: 24 mmol/L (ref 22–32)
Calcium: 8.7 mg/dL — ABNORMAL LOW (ref 8.9–10.3)
Chloride: 105 mmol/L (ref 98–111)
Creatinine, Ser: 0.82 mg/dL (ref 0.44–1.00)
GFR, Estimated: >60 mL/min (ref >60)
Glucose, Bld: 121 mg/dL — ABNORMAL HIGH (ref 70–99)
Potassium: 3.4 mmol/L — ABNORMAL LOW (ref 3.5–5.1)
Sodium: 137 mmol/L (ref 135–145)

## 2023-02-10 LAB — TROPONIN I (HIGH SENSITIVITY): Troponin I (High Sensitivity): 8 ng/L (ref <18)

## 2023-02-10 MED ORDER — LIDOCAINE 5 % EX PTCH
1.0000 | MEDICATED_PATCH | CUTANEOUS | Status: DC
  Administered 2023-02-10: 1 via TRANSDERMAL
  Filled 2023-02-10: qty 1

## 2023-02-10 MED ORDER — HALOPERIDOL LACTATE 5 MG/ML IJ SOLN: 2.5000 mg | Freq: Once | INTRAMUSCULAR | Status: DC

## 2023-02-10 MED ORDER — HALOPERIDOL LACTATE 5 MG/ML IJ SOLN
2.5000 mg | Freq: Once | INTRAMUSCULAR | Status: AC
  Administered 2023-02-10: 2.5 mg via INTRAMUSCULAR
  Filled 2023-02-10: qty 1

## 2023-02-10 NOTE — ED Notes (Signed)
Two RN attempted IV. Pt advised she is difficult IV and blood draw. Lab called.
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
## 2023-03-11 ENCOUNTER — Emergency Department
Admission: EM | Admit: 2023-03-11 | Discharge: 2023-03-11 | Disposition: A | Discharge status: Home / Self Care | Payer: Medicaid Other | Attending: Emergency Medicine | Admitting: Emergency Medicine

## 2023-03-11 ENCOUNTER — Emergency Department: Payer: Medicaid Other

## 2023-03-11 ENCOUNTER — Other Ambulatory Visit: Payer: Self-pay

## 2023-03-11 ENCOUNTER — Encounter: Payer: Self-pay | Admitting: Emergency Medicine

## 2023-03-11 DIAGNOSIS — R1084 Generalized abdominal pain: Secondary | ICD-10-CM | POA: Insufficient documentation

## 2023-03-11 DIAGNOSIS — R112 Nausea with vomiting, unspecified: Secondary | ICD-10-CM | POA: Insufficient documentation

## 2023-03-11 DIAGNOSIS — R197 Diarrhea, unspecified: Secondary | ICD-10-CM | POA: Insufficient documentation

## 2023-03-11 LAB — CBC
HCT: 39.4 % (ref 36.0–46.0)
Hemoglobin: 12.9 g/dL (ref 12.0–15.0)
MCH: 29.1 pg (ref 26.0–34.0)
MCHC: 32.7 g/dL (ref 30.0–36.0)
MCV: 88.9 fL (ref 80.0–100.0)
Platelets: 283 10*3/uL (ref 150–400)
RBC: 4.43 MIL/uL (ref 3.87–5.11)
RDW: 13.5 % (ref 11.5–15.5)
WBC: 6.8 10*3/uL (ref 4.0–10.5)
nRBC: 0 % (ref 0.0–0.2)

## 2023-03-11 LAB — COMPREHENSIVE METABOLIC PANEL
ALT: 21 U/L (ref 0–44)
AST: 24 U/L (ref 15–41)
Albumin: 3.6 g/dL (ref 3.5–5.0)
Alkaline Phosphatase: 67 U/L (ref 38–126)
Anion gap: 9 (ref 5–15)
BUN: 8 mg/dL (ref 6–20)
CO2: 25 mmol/L (ref 22–32)
Calcium: 8.6 mg/dL — ABNORMAL LOW (ref 8.9–10.3)
Chloride: 106 mmol/L (ref 98–111)
Creatinine, Ser: 0.81 mg/dL (ref 0.44–1.00)
GFR, Estimated: >60 mL/min (ref >60)
Glucose, Bld: 117 mg/dL — ABNORMAL HIGH (ref 70–99)
Potassium: 3.5 mmol/L (ref 3.5–5.1)
Sodium: 140 mmol/L (ref 135–145)
Total Bilirubin: 0.4 mg/dL (ref 0.3–1.2)
Total Protein: 7 g/dL (ref 6.5–8.1)

## 2023-03-11 LAB — LIPASE, BLOOD: Lipase: 24 U/L (ref 11–51)

## 2023-03-11 MED ORDER — HYDROMORPHONE HCL 1 MG/ML IJ SOLN
1.0000 mg | Freq: Once | INTRAMUSCULAR | Status: AC
  Administered 2023-03-11: 1 mg via INTRAVENOUS
  Filled 2023-03-11: qty 1

## 2023-03-11 MED ORDER — SODIUM CHLORIDE 0.9 % IV BOLUS
1000.0000 mL | Freq: Once | INTRAVENOUS | Status: AC
  Administered 2023-03-11: 1000 mL via INTRAVENOUS

## 2023-03-11 MED ORDER — ONDANSETRON HCL 4 MG/2ML IJ SOLN
4.0000 mg | Freq: Once | INTRAMUSCULAR | Status: AC
  Administered 2023-03-11: 4 mg via INTRAVENOUS
  Filled 2023-03-11: qty 2

## 2023-03-11 MED ORDER — DICYCLOMINE HCL 10 MG PO CAPS
10.0000 mg | ORAL_CAPSULE | Freq: Once | ORAL | Status: AC
  Administered 2023-03-11: 10 mg via ORAL
  Filled 2023-03-11: qty 1

## 2023-03-11 MED ORDER — IOHEXOL 350 MG/ML SOLN
100.0000 mL | Freq: Once | INTRAVENOUS | Status: AC | PRN
  Administered 2023-03-11: 100 mL via INTRAVENOUS

## 2023-03-11 MED ORDER — ONDANSETRON 4 MG PO TBDP: 4.0000 mg | ORAL_TABLET | Freq: Three times a day (TID) | ORAL | 0 refills | Status: DC | PRN

## 2023-03-11 MED ORDER — ALUM & MAG HYDROXIDE-SIMETH 200-200-20 MG/5ML PO SUSP
30.0000 mL | Freq: Once | ORAL | Status: AC
  Administered 2023-03-11: 30 mL via ORAL
  Filled 2023-03-11: qty 30

## 2023-03-11 MED ORDER — LOPERAMIDE HCL 2 MG PO TABS: 2.0000 mg | ORAL_TABLET | Freq: Four times a day (QID) | ORAL | 0 refills | Status: DC | PRN

## 2023-03-11 MED ORDER — DICYCLOMINE HCL 10 MG PO CAPS: 10.0000 mg | ORAL_CAPSULE | Freq: Three times a day (TID) | ORAL | 0 refills | Status: DC

## 2023-03-11 MED ORDER — KETOROLAC TROMETHAMINE 30 MG/ML IJ SOLN
30.0000 mg | Freq: Once | INTRAMUSCULAR | Status: AC
  Administered 2023-03-11: 30 mg via INTRAVENOUS
  Filled 2023-03-11: qty 1

## 2023-03-11 NOTE — Progress Notes (Signed)
A consult was placed to the IV Nurse for IV access; pt needing a CT ; both arms assessed thoroughly with ultrasound; very poor access noted; veins are very small, deep,and bifurcate;  able to place a 20ga 1" just below Left AC; got excellent blood return and flushed easily w NS;  suggest hand injecting for contrast as vein is very fragile.

## 2023-03-11 NOTE — ED Provider Notes (Signed)
[facility] Provider Note  Patient Contact: 3:34 PM (approximate)   History   Abdominal Pain   HPI  Sarah Sosa is a 42 y.o. female who presents the emergency department complaining of diffuse abdominal pain, nausea and vomiting and diarrhea for a week.  She states that she has a history of chronic pancreatitis, thinks that one of her other medications, I will set her vinegar pills are irritating her chronic issue.  Patient denies any hematic emesis, hematochezia.  Patient denies any fevers, chills.  No URI symptoms.  At this time.     Physical Exam   Triage Vital Signs: ED Triage Vitals  Encounter Vitals Group     BP 03/11/23 1415 (!) 144/103     Systolic BP Percentile --      Diastolic BP Percentile --      Pulse Rate 03/11/23 1415 (!) 103     Resp 03/11/23 1417 18     Temp 03/11/23 1415 98 F (36.7 C)     Temp Source 03/11/23 1415 Oral     SpO2 03/11/23 1415 97 %     Weight 03/11/23 1417 269 lb (122 kg)     Height 03/11/23 1417 5\' 7"  (1.702 m)     Head Circumference --      Peak Flow --      Pain Score 03/11/23 1417 10     Pain Loc --      Pain Education --      Exclude from Growth Chart --     Most recent vital signs: Vitals:   03/11/23 1415 03/11/23 1417  BP: (!) 144/103   Pulse: (!) 103   Resp:  18  Temp: 98 F (36.7 C)   SpO2: 97%      General: Alert and in no acute distress.  Cardiovascular:  Good peripheral perfusion Respiratory: Normal respiratory effort without tachypnea or retractions. Lungs CTAB. Good air entry to the bases with no decreased or absent breath sounds. Gastrointestinal: Bowel sounds 4 quadrants. Soft and nontender to palpation. No guarding or rigidity. No palpable masses. No distention. No CVA tenderness. Musculoskeletal: Full range of motion to all extremities.  Neurologic:  No gross focal neurologic deficits are appreciated.  Skin:   No rash noted Other:   ED Results / Procedures / Treatments    Labs (all labs ordered are listed, but only abnormal results are displayed) Labs Reviewed  COMPREHENSIVE METABOLIC PANEL - Abnormal; Notable for the following components:      Result Value   Glucose, Bld 117 (*)    Calcium 8.6 (*)    All other components within normal limits  LIPASE, BLOOD  CBC  URINALYSIS, ROUTINE W REFLEX MICROSCOPIC     EKG     RADIOLOGY  I personally viewed, evaluated, and interpreted these images as part of my medical decision making, as well as reviewing the written report by the radiologist.  ED Provider Interpretation: 2.9 cm lesion likely hemorrhagic cyst on the left adnexa.  No other acute findings on CT abdomen pelvis.  CT ABDOMEN PELVIS W CONTRAST  Result Date: 03/11/2023 CLINICAL DATA:  For approximately 1 week. Nausea and vomiting. Diarrhea. History of pancreatitis. EXAM: CT ABDOMEN AND PELVIS WITH CONTRAST TECHNIQUE: Multidetector CT imaging of the abdomen and pelvis was performed using the standard protocol following bolus administration of intravenous contrast. RADIATION DOSE REDUCTION: This exam was performed according to the departmental dose-optimization program which includes automated exposure control, adjustment of the mA and/or  kV according to patient size and/or use of iterative reconstruction technique. CONTRAST:  OMNIPAQUE IOHEXOL 350 MG/ML SOLN COMPARISON:  11/15/2022 FINDINGS: Lower Chest: No acute findings. Hepatobiliary:  No suspicious hepatic masses identified. Pancreas: Normal appearance. No evidence of mass or inflammatory changes. No evidence of pancreatic calcification or ductal dilatation. Spleen: Within normal limits in size and appearance. Adrenals/Urinary Tract: No suspicious masses identified. No evidence of ureteral calculi or hydronephrosis. Stomach/Bowel: No evidence of obstruction, inflammatory process or abnormal fluid collections. Vascular/Lymphatic: No pathologically enlarged lymph nodes. No acute vascular findings.  Reproductive: Prior hysterectomy again noted. A new 2.9 x 2.7 cm intermediate attenuation lesion is seen in the left adnexa. This likely represents a hemorrhagic cyst or possibly endometrioma in a reproductive age female. No evidence of inflammatory changes or free fluid. Other:  None. Musculoskeletal:  No suspicious bone lesions identified. IMPRESSION: No radiographic evidence of pancreatitis. New 2.9 cm intermediate attenuation lesion in left adnexa, likely representing a hemorrhagic cyst or possibly endometrioma in a reproductive age female. Recommend follow-up with pelvic ultrasound in 6-8 weeks. Electronically Signed   By: Danae Orleans M.D.   On: 03/11/2023 17:52    PROCEDURES:  Critical Care performed: No  Procedures   MEDICATIONS ORDERED IN ED: Medications  HYDROmorphone (DILAUDID) injection 1 mg (1 mg Intravenous Given 03/11/23 1702)  ondansetron (ZOFRAN) injection 4 mg (4 mg Intravenous Given 03/11/23 1702)  dicyclomine (BENTYL) capsule 10 mg (10 mg Oral Given 03/11/23 1633)  alum & mag hydroxide-simeth (MAALOX/MYLANTA) 200-200-20 MG/5ML suspension 30 mL (30 mLs Oral Given 03/11/23 1633)  ketorolac (TORADOL) 30 MG/ML injection 30 mg (30 mg Intravenous Given 03/11/23 1702)  sodium chloride 0.9 % bolus 1,000 mL (1,000 mLs Intravenous New Bag/Given 03/11/23 1701)  iohexol (OMNIPAQUE) 350 MG/ML injection 100 mL (100 mLs Intravenous Contrast Given 03/11/23 1713)     IMPRESSION / MDM / ASSESSMENT AND PLAN / ED COURSE  I reviewed the triage vital signs and the nursing notes.                                 Differential diagnosis includes, but is not limited to, viral gastroenteritis, pancreatitis, gastritis, appendicitis, colitis   Patient's presentation is most consistent with acute presentation with potential threat to life or bodily function.   Patient's diagnosis is consistent with nausea, vomiting, diarrhea.  Patient presents emergency department with symptoms for the past 4 days.   History of chronic pancreatitis and she believes that this is flared.  Patient has reassuring exam and labs.  Given the complaints imaging was obtained.  Incidental finding of what appears to be a simple cyst in the left adnexa and I advised the patient to follow-up with OB for outpatient ultrasound in several months to ensure resolution.  There is no findings on CT to explain patient's current symptoms.  Will treat symptomatically with antiemetics, Bentyl and antidiarrheal.  Follow-up primary care as needed.. Patient is given ED precautions to return to the ED for any worsening or new symptoms.     FINAL CLINICAL IMPRESSION(S) / ED DIAGNOSES   Final diagnoses:  Nausea and vomiting, unspecified vomiting type  Diarrhea, unspecified type  Generalized abdominal pain     Rx / DC Orders   ED Discharge Orders          Ordered    ondansetron (ZOFRAN-ODT) 4 MG disintegrating tablet  Every 8 hours PRN  03/11/23 1835    loperamide (IMODIUM A-D) 2 MG tablet  4 times daily PRN        03/11/23 1835    dicyclomine (BENTYL) 10 MG capsule  3 times daily before meals & bedtime        03/11/23 1835             Note:  This document was prepared using Dragon voice recognition software and may include unintentional dictation errors.   Lanette Hampshire 03/11/23 Vertell Novak, MD 03/11/23 [phone]

## 2023-03-11 NOTE — ED Triage Notes (Signed)
Patient to ED via POV for abd pain since the beginning of the week. Having N/V/D. States hx of pancreatitis.

## 2023-03-20 ENCOUNTER — Other Ambulatory Visit: Payer: Self-pay

## 2023-03-20 ENCOUNTER — Emergency Department: Payer: Medicaid Other

## 2023-03-20 ENCOUNTER — Inpatient Hospital Stay
Admission: EM | Admit: 2023-03-20 | Discharge: 2023-03-22 | DRG: 917 | Disposition: A | Discharge status: Home / Self Care | Payer: Medicaid Other | Attending: Internal Medicine | Admitting: Internal Medicine

## 2023-03-20 DIAGNOSIS — I5030 Unspecified diastolic (congestive) heart failure: Secondary | ICD-10-CM | POA: Diagnosis present

## 2023-03-20 DIAGNOSIS — Z79891 Long term (current) use of opiate analgesic: Secondary | ICD-10-CM | POA: Diagnosis not present

## 2023-03-20 DIAGNOSIS — I5032 Chronic diastolic (congestive) heart failure: Secondary | ICD-10-CM | POA: Diagnosis present

## 2023-03-20 DIAGNOSIS — Z79899 Other long term (current) drug therapy: Secondary | ICD-10-CM

## 2023-03-20 DIAGNOSIS — F1911 Other psychoactive substance abuse, in remission: Secondary | ICD-10-CM | POA: Diagnosis present

## 2023-03-20 DIAGNOSIS — K861 Other chronic pancreatitis: Secondary | ICD-10-CM | POA: Diagnosis present

## 2023-03-20 DIAGNOSIS — Z888 Allergy status to other drugs, medicaments and biological substances status: Secondary | ICD-10-CM | POA: Diagnosis not present

## 2023-03-20 DIAGNOSIS — I10 Essential (primary) hypertension: Secondary | ICD-10-CM | POA: Diagnosis present

## 2023-03-20 DIAGNOSIS — Z91040 Latex allergy status: Secondary | ICD-10-CM | POA: Diagnosis not present

## 2023-03-20 DIAGNOSIS — Z885 Allergy status to narcotic agent status: Secondary | ICD-10-CM

## 2023-03-20 DIAGNOSIS — R112 Nausea with vomiting, unspecified: Secondary | ICD-10-CM | POA: Diagnosis present

## 2023-03-20 DIAGNOSIS — Y929 Unspecified place or not applicable: Secondary | ICD-10-CM | POA: Diagnosis not present

## 2023-03-20 DIAGNOSIS — Z6841 Body Mass Index (BMI) 40.0 and over, adult: Secondary | ICD-10-CM

## 2023-03-20 DIAGNOSIS — I110 Hypertensive heart disease with heart failure: Secondary | ICD-10-CM | POA: Diagnosis present

## 2023-03-20 DIAGNOSIS — Z8719 Personal history of other diseases of the digestive system: Secondary | ICD-10-CM

## 2023-03-20 DIAGNOSIS — J9601 Acute respiratory failure with hypoxia: Secondary | ICD-10-CM

## 2023-03-20 DIAGNOSIS — T402X1A Poisoning by other opioids, accidental (unintentional), initial encounter: Principal | ICD-10-CM | POA: Diagnosis present

## 2023-03-20 DIAGNOSIS — Z886 Allergy status to analgesic agent status: Secondary | ICD-10-CM | POA: Diagnosis not present

## 2023-03-20 DIAGNOSIS — K219 Gastro-esophageal reflux disease without esophagitis: Secondary | ICD-10-CM | POA: Diagnosis present

## 2023-03-20 DIAGNOSIS — T40601A Poisoning by unspecified narcotics, accidental (unintentional), initial encounter: Principal | ICD-10-CM

## 2023-03-20 DIAGNOSIS — Z8261 Family history of arthritis: Secondary | ICD-10-CM

## 2023-03-20 DIAGNOSIS — R1110 Vomiting, unspecified: Secondary | ICD-10-CM | POA: Insufficient documentation

## 2023-03-20 DIAGNOSIS — Z8249 Family history of ischemic heart disease and other diseases of the circulatory system: Secondary | ICD-10-CM

## 2023-03-20 DIAGNOSIS — G894 Chronic pain syndrome: Secondary | ICD-10-CM | POA: Diagnosis present

## 2023-03-20 DIAGNOSIS — F17210 Nicotine dependence, cigarettes, uncomplicated: Secondary | ICD-10-CM | POA: Diagnosis present

## 2023-03-20 DIAGNOSIS — E66813 Obesity, class 3: Secondary | ICD-10-CM | POA: Insufficient documentation

## 2023-03-20 LAB — URINE DRUG SCREEN, QUALITATIVE (ARMC ONLY)
Amphetamines, Ur Screen: NOT DETECTED
Barbiturates, Ur Screen: NOT DETECTED
Benzodiazepine, Ur Scrn: NOT DETECTED
Cannabinoid 50 Ng, Ur ~~LOC~~: NOT DETECTED
Cocaine Metabolite,Ur ~~LOC~~: NOT DETECTED
MDMA (Ecstasy)Ur Screen: NOT DETECTED
Methadone Scn, Ur: POSITIVE — AB
Opiate, Ur Screen: NOT DETECTED
Phencyclidine (PCP) Ur S: NOT DETECTED
Tricyclic, Ur Screen: POSITIVE — AB

## 2023-03-20 LAB — COMPREHENSIVE METABOLIC PANEL
ALT: 20 U/L (ref 0–44)
AST: 22 U/L (ref 15–41)
Albumin: 3.7 g/dL (ref 3.5–5.0)
Alkaline Phosphatase: 62 U/L (ref 38–126)
Anion gap: 8 (ref 5–15)
BUN: 11 mg/dL (ref 6–20)
CO2: 33 mmol/L — ABNORMAL HIGH (ref 22–32)
Calcium: 8.7 mg/dL — ABNORMAL LOW (ref 8.9–10.3)
Chloride: 97 mmol/L — ABNORMAL LOW (ref 98–111)
Creatinine, Ser: 0.82 mg/dL (ref 0.44–1.00)
GFR, Estimated: >60 mL/min (ref >60)
Glucose, Bld: 126 mg/dL — ABNORMAL HIGH (ref 70–99)
Potassium: 3.7 mmol/L (ref 3.5–5.1)
Sodium: 138 mmol/L (ref 135–145)
Total Bilirubin: 0.4 mg/dL (ref 0.3–1.2)
Total Protein: 7.7 g/dL (ref 6.5–8.1)

## 2023-03-20 LAB — CBC WITH DIFFERENTIAL/PLATELET
Abs Immature Granulocytes: 0.02 10*3/uL (ref 0.00–0.07)
Basophils Absolute: 0 10*3/uL (ref 0.0–0.1)
Basophils Relative: 0 %
Eosinophils Absolute: 0 10*3/uL (ref 0.0–0.5)
Eosinophils Relative: 0 %
HCT: 37.7 % (ref 36.0–46.0)
Hemoglobin: 11.8 g/dL — ABNORMAL LOW (ref 12.0–15.0)
Immature Granulocytes: 0 %
Lymphocytes Relative: 11 %
Lymphs Abs: 1 10*3/uL (ref 0.7–4.0)
MCH: 30.1 pg (ref 26.0–34.0)
MCHC: 31.3 g/dL (ref 30.0–36.0)
MCV: 96.2 fL (ref 80.0–100.0)
Monocytes Absolute: 0.5 10*3/uL (ref 0.1–1.0)
Monocytes Relative: 5 %
Neutro Abs: 7.9 10*3/uL — ABNORMAL HIGH (ref 1.7–7.7)
Neutrophils Relative %: 84 %
Platelets: 245 10*3/uL (ref 150–400)
RBC: 3.92 MIL/uL (ref 3.87–5.11)
RDW: 13.8 % (ref 11.5–15.5)
WBC: 9.5 10*3/uL (ref 4.0–10.5)
nRBC: 0 % (ref 0.0–0.2)

## 2023-03-20 LAB — ACETAMINOPHEN LEVEL: Acetaminophen (Tylenol), Serum: <10 ug/mL — ABNORMAL LOW (ref 10–30)

## 2023-03-20 LAB — ETHANOL: Alcohol, Ethyl (B): <10 mg/dL (ref <10)

## 2023-03-20 LAB — MRSA NEXT GEN BY PCR, NASAL: MRSA by PCR Next Gen: NOT DETECTED

## 2023-03-20 LAB — PROTIME-INR
INR: 2 — ABNORMAL HIGH (ref 0.8–1.2)
Prothrombin Time: 22.7 s — ABNORMAL HIGH (ref 11.4–15.2)

## 2023-03-20 LAB — HIV ANTIBODY (ROUTINE TESTING W REFLEX): HIV Screen 4th Generation wRfx: NONREACTIVE

## 2023-03-20 LAB — GLUCOSE, CAPILLARY: Glucose-Capillary: 85 mg/dL (ref 70–99)

## 2023-03-20 LAB — LIPASE, BLOOD: Lipase: 22 U/L (ref 11–51)

## 2023-03-20 LAB — SALICYLATE LEVEL: Salicylate Lvl: <7 mg/dL — ABNORMAL LOW (ref 7.0–30.0)

## 2023-03-20 MED ORDER — NALOXONE HCL 4 MG/10ML IJ SOLN
0.5000 mg/h | INTRAVENOUS | Status: DC
  Administered 2023-03-20: 0.5 mg/h via INTRAVENOUS
  Filled 2023-03-20 (×2): qty 10

## 2023-03-20 MED ORDER — ORAL CARE MOUTH RINSE: 15.0000 mL | OROMUCOSAL | Status: DC | PRN

## 2023-03-20 MED ORDER — NALOXONE HCL 0.4 MG/ML IJ SOLN
0.2000 mg | Freq: Once | INTRAMUSCULAR | Status: AC
  Administered 2023-03-20: 0.2 mg via INTRAVENOUS

## 2023-03-20 MED ORDER — DROPERIDOL 2.5 MG/ML IJ SOLN
2.5000 mg | Freq: Once | INTRAMUSCULAR | Status: AC
  Administered 2023-03-20: 2.5 mg via INTRAVENOUS

## 2023-03-20 MED ORDER — ONDANSETRON HCL 4 MG/2ML IJ SOLN
4.0000 mg | INTRAMUSCULAR | Status: AC
  Administered 2023-03-20: 4 mg via INTRAVENOUS

## 2023-03-20 MED ORDER — HYDRALAZINE HCL 20 MG/ML IJ SOLN: 10.0000 mg | INTRAMUSCULAR | Status: DC | PRN

## 2023-03-20 MED ORDER — ACETAMINOPHEN 325 MG PO TABS
650.0000 mg | ORAL_TABLET | Freq: Four times a day (QID) | ORAL | Status: DC | PRN
  Administered 2023-03-21: 650 mg via ORAL
  Filled 2023-03-20: qty 2

## 2023-03-20 MED ORDER — LACTATED RINGERS IV SOLN: INTRAVENOUS | Status: DC

## 2023-03-20 MED ORDER — NALOXONE HCL 2 MG/2ML IJ SOSY
0.4000 mg | PREFILLED_SYRINGE | Freq: Once | INTRAMUSCULAR | Status: AC
  Administered 2023-03-20: 0.4 mg via INTRAVENOUS
  Filled 2023-03-20: qty 2

## 2023-03-20 MED ORDER — CHLORHEXIDINE GLUCONATE CLOTH 2 % EX PADS
6.0000 | MEDICATED_PAD | Freq: Every day | CUTANEOUS | Status: DC
  Administered 2023-03-20: 6 via TOPICAL

## 2023-03-20 MED ORDER — ONDANSETRON HCL 4 MG/2ML IJ SOLN
4.0000 mg | Freq: Four times a day (QID) | INTRAMUSCULAR | Status: DC | PRN
  Administered 2023-03-21 – 2023-03-22 (×3): 4 mg via INTRAVENOUS
  Filled 2023-03-20 (×3): qty 2

## 2023-03-20 MED ORDER — ONDANSETRON HCL 4 MG/2ML IJ SOLN
INTRAMUSCULAR | Status: AC
  Filled 2023-03-20: qty 2

## 2023-03-20 MED ORDER — ONDANSETRON 4 MG PO TBDP
4.0000 mg | ORAL_TABLET | ORAL | Status: AC
  Administered 2023-03-20: 4 mg via ORAL
  Filled 2023-03-20: qty 1

## 2023-03-20 MED ORDER — ONDANSETRON HCL 4 MG/2ML IJ SOLN: 4.0000 mg | INTRAMUSCULAR | Status: DC

## 2023-03-20 MED ORDER — ACETAMINOPHEN 650 MG RE SUPP: 650.0000 mg | Freq: Four times a day (QID) | RECTAL | Status: DC | PRN

## 2023-03-20 MED ORDER — ENOXAPARIN SODIUM 80 MG/0.8ML IJ SOSY
70.0000 mg | PREFILLED_SYRINGE | INTRAMUSCULAR | Status: DC
  Administered 2023-03-20 – 2023-03-21 (×2): 70 mg via SUBCUTANEOUS
  Filled 2023-03-20 (×2): qty 0.7
  Filled 2023-03-20: qty 0.8

## 2023-03-20 MED ORDER — ENOXAPARIN SODIUM 40 MG/0.4ML IJ SOSY: 40.0000 mg | PREFILLED_SYRINGE | INTRAMUSCULAR | Status: DC

## 2023-03-20 MED ORDER — ONDANSETRON HCL 4 MG PO TABS: 4.0000 mg | ORAL_TABLET | Freq: Four times a day (QID) | ORAL | Status: DC | PRN

## 2023-03-20 NOTE — Progress Notes (Addendum)
Sarah Sosa is a 42 y.o. female with medical history significant for asthma,  hypertension, alcohol use disorder, HFpEF, chronic pain on methadone and oxycodone from the [facility] pain clinic, peripheral neuropathy and recurrent alcoholic pancreatitis, was brought to the ED by EMS after accidental overdose.  She was found to have acute hypoxic respiratory failure from accidental opioid overdose.  She was seen and examined at the bedside in the emergency department. She complains of  upper abdominal pain.  No other complaints.  No vomiting, shortness of breath or chest pain.  She is still requiring 3 L/min oxygen via [place].  Otherwise vital signs are stable.  Wean off oxygen as tolerated. Continue IV Narcan infusion and wean off as able.

## 2023-03-20 NOTE — Progress Notes (Signed)
PHARMACIST - PHYSICIAN COMMUNICATION  CONCERNING:  Enoxaparin (Lovenox) for DVT Prophylaxis    RECOMMENDATION: Patient was prescribed enoxaprin 40mg  q24 hours for VTE prophylaxis.   Filed Weights   03/20/23 0038  Weight: 135.5 kg (298 lb 11.6 oz)    Body mass index is 46.79 kg/m.  Estimated Creatinine Clearance: 130 mL/min (by C-G formula based on SCr of 0.82 mg/dL).   Based on [facility] policy patient is candidate for enoxaparin 0.5mg /kg TBW SQ every 24 hours based on BMI being >30.  DESCRIPTION: Pharmacy has adjusted enoxaparin dose per [facility] policy.  Patient is now receiving enoxaparin 0.5 mg/kg every 24 hours   Otelia Sergeant, PharmD, [facility] 03/20/2023 5:17 AM

## 2023-03-20 NOTE — Assessment & Plan Note (Addendum)
Acute respiratory failure with hypoxia Continue Narcan infusion Supplemental oxygen to keep sats over 94% Will keep n.p.o. for now Aspiration precautions Close monitoring in stepdown

## 2023-03-20 NOTE — ED Notes (Signed)
Spo2 now reading 94-96%. Pt remains on 4L [place].

## 2023-03-20 NOTE — Assessment & Plan Note (Signed)
Follows at [facility] pain clinic and is on methadone and oxycodone Once safe to swallow can resume pregabalin gabapentin, methadone and caution with oxycodone

## 2023-03-20 NOTE — Assessment & Plan Note (Addendum)
Appears compensated To resume home GDMT when more alert and able to swallow

## 2023-03-20 NOTE — Assessment & Plan Note (Signed)
Follow UDS

## 2023-03-20 NOTE — ED Notes (Signed)
Spo2 88% on 2L [place]. Oxygen increased to 3 L and Spo2 increased to 90%. Oxygen again increased to 4L and Spo2 92%. MD notified of increased oxygen need.

## 2023-03-20 NOTE — ED Notes (Signed)
Lab called to collect labs  

## 2023-03-20 NOTE — ED Provider Notes (Signed)
[facility] Provider Note    Event Date/Time   First MD Initiated Contact with Patient 03/20/23 0016     (approximate)   History   Drug Overdose   HPI Sarah Sosa is a 42 y.o. female who presents by EMS after an apparent accidental drug overdose.  The patient reports a history of chronic pain syndrome and pain from chronic pancreatitis.  She goes to the [facility] pain clinic and takes methadone as well as oxycodone.  Reportedly she took multiple doses of her oxycodone tonight because she said that she was also throwing up and so she was not sure how much of her pain medicine she was getting.  At some point she went unresponsive and her family found her and she did not seem to be breathing or responding at all.  There was no CPR performed but when EMS arrived they administered 2 mg of Narcan intranasally and the patient woke up.  She is now awake and alert.  She said that she does not drink alcohol and that she has not used any drugs, just her prescription medications.  She seems surprised and adamantly denied having any suicidal ideation when I ask her if she tried to kill her self with an overdose.  She denies any other ingestions.  She said that she has a history of a mechanical heart valve replacement and history of heart failure.  She has not been ill recently in terms of having a fever or cough.     Physical Exam   Triage Vital Signs: ED Triage Vitals  Encounter Vitals Group     BP 03/20/23 0032 (!) 156/125     Systolic BP Percentile --      Diastolic BP Percentile --      Pulse Rate 03/20/23 0032 (!) 106     Resp 03/20/23 0018 19     Temp 03/20/23 0032 98.3 F (36.8 C)     Temp Source 03/20/23 0032 Oral     SpO2 --      Weight 03/20/23 0038 135.5 kg (298 lb 11.6 oz)     Height 03/20/23 0018 1.702 m (5\' 7" )     Head Circumference --      Peak Flow --      Pain Score 03/20/23 0018 0     Pain Loc --      Pain Education --      Exclude from Growth  Chart --     Most recent vital signs: Vitals:   03/20/23 0425 03/20/23 0430  BP:  (!) 195/90  Pulse: 86 87  Resp: (!) 9 (!) 28  Temp:    SpO2: 96% 94%    General: Awake, alert, no obvious distress. CV:  Good peripheral perfusion.  regular rate and rhythm (initially tachycardic upon arrival but that resolved). Resp:  Normal effort. Speaking easily and comfortably, no accessory muscle usage nor intercostal retractions.  Lungs are clear to auscultation bilaterally. Abd:  No distention.  Mild tenderness to palpation in the epigastrium, no guarding or rebound. Other:  Patient denies SI and states that this was an accidental overdose because she kept taking her oxycodone in addition to her methadone.   ED Results / Procedures / Treatments   Labs (all labs ordered are listed, but only abnormal results are displayed) Labs Reviewed  COMPREHENSIVE METABOLIC PANEL - Abnormal; Notable for the following components:      Result Value   Chloride 97 (*)    CO2 33 (*)  Glucose, Bld 126 (*)    Calcium 8.7 (*)    All other components within normal limits  SALICYLATE LEVEL - Abnormal; Notable for the following components:   Salicylate Lvl <7.0 (*)    All other components within normal limits  ACETAMINOPHEN LEVEL - Abnormal; Notable for the following components:   Acetaminophen (Tylenol), Serum <10 (*)    All other components within normal limits  CBC WITH DIFFERENTIAL/PLATELET - Abnormal; Notable for the following components:   Hemoglobin 11.8 (*)    Neutro Abs 7.9 (*)    All other components within normal limits  LIPASE, BLOOD  ETHANOL  URINE DRUG SCREEN, QUALITATIVE ([facility] ONLY)     EKG  ED ECG REPORT I, Loleta Rose, the attending physician, personally viewed and interpreted this ECG.  Date: 03/20/2023 EKG Time: 00: 35 Rate: 98 Rhythm: normal sinus rhythm QRS Axis: normal Intervals: normal ST/T Wave abnormalities: Non-specific ST segment / T-wave changes, but no clear  evidence of acute ischemia. Narrative Interpretation: no definitive evidence of acute ischemia; does not meet STEMI criteria.    RADIOLOGY I viewed and interpreted her acute abdomen series with chest x-ray.   See hospital course for details.   PROCEDURES:  Critical Care performed: Yes, see critical care procedure note(s)  .1-3 Lead EKG Interpretation  Performed by: Loleta Rose, MD Authorized by: Loleta Rose, MD     Interpretation: normal     ECG rate:  95   ECG rate assessment: normal     Rhythm: sinus rhythm     Ectopy: none     Conduction: normal   .Critical Care  Performed by: Loleta Rose, MD Authorized by: Loleta Rose, MD   Critical care provider statement:    Critical care time (minutes):  45   Critical care time was exclusive of:  Separately billable procedures and treating other patients   Critical care was necessary to treat or prevent imminent or life-threatening deterioration of the following conditions:  Toxidrome   Critical care was time spent personally by me on the following activities:  Development of treatment plan with patient or surrogate, evaluation of patient's response to treatment, examination of patient, obtaining history from patient or surrogate, ordering and performing treatments and interventions, ordering and review of laboratory studies, ordering and review of radiographic studies, pulse oximetry, re-evaluation of patient's condition and review of old charts     IMPRESSION / MDM / ASSESSMENT AND PLAN / ED COURSE  I reviewed the triage vital signs and the nursing notes.                              Differential diagnosis includes, but is not limited to, accidental overdose, intentional overdose, polypharmacy, acute on chronic pain, electrolyte or metabolic abnormality,  Patient's presentation is most consistent with acute presentation with potential threat to life or bodily function.  Labs/studies ordered: Urine drug screen to check  for coingestions, salicylate level and acetaminophen levels for the same reason (to check for coingestions), ethanol level to look for other possible contributing factors to her unresponsive episode (specifically alcohol intoxication), CMP, lipase, acute abdomen series with chest x-ray given the report of vomiting and the prior history of heart/lung issues.  Interventions/Medications given:  Medications  naloxone HCl (NARCAN) 4 mg in dextrose 5 % 250 mL infusion (0.5 mg/hr Intravenous New Bag/Given 03/20/23 0420)  ondansetron (ZOFRAN-ODT) disintegrating tablet 4 mg (4 mg Oral Given 03/20/23 0146)  naloxone (NARCAN) injection  0.4 mg (0.4 mg Intravenous Given 03/20/23 0223)  naloxone [facility]) injection 0.2 mg (0.2 mg Intravenous Given 03/20/23 0400)  ondansetron (ZOFRAN) injection 4 mg (4 mg Intravenous Given 03/20/23 0414)  droperidol (INAPSINE) 2.5 MG/ML injection 2.5 mg (2.5 mg Intravenous Given 03/20/23 0421)    (Note:  hospital course my include additional interventions and/or labs/studies not listed above.)   Initial vital signs show hypertension, particularly diastolic, but I think this was not a good or accurate measurement.  Initially she was also tachycardic but that resolved after she got here.  She is not in acute distress she appears older than chronological age and she certainly has extensive chronic medical issues including chronic pain syndromes.  I suspect this was an accident and that she was not intentionally trying to harm herself.  Her nurse, Dorian, overheard a conversation the patient was having with a family member over speaker phone where the family member was telling her that she needs to take her pain medications as prescribed and not just "whenever she wants".  The patient made a reference to the fact that she "had another accident" in reference to taking too much of her medication.  This sounds like a chronic and long-term issue rather than representing an acute psychiatric or  medical problem.  I will evaluate broadly to verify that there are no acute issues either which could have contributed to or as a result of her accidental overdose, but I suspect this will require follow-up at the [facility] pain clinic.  I ordered Zofran 4 mg IV for her report of nausea.  The patient is on the cardiac monitor to evaluate for evidence of arrhythmia and/or significant heart rate changes.   Clinical Course as of 03/20/23 0450  Tue Mar 20, 2023  0140 DG Abdomen Acute W/Chest I viewed and interpreted the patient's abdominal x-rays and chest x-ray.  I see no evidence of acute abnormality such as air-fluid levels suggestive of SBO or ileus and no evidence of aspiration or other pulmonary abnormality. [CF]  0228 Patient desatted to 80% on 2 L of oxygen nasal cannula and her breathing slowed.  I talked with her and provided some physical stimulation and she woke up and took some deep breaths which brought her oxygenation into the low 90s but then she fell asleep again.  I ordered Narcan 0.4 mg IV given that her respirations were compromised. [CF]  0229 Patient now wide-awake, complaining of pain everywhere, not vomiting, but agitated and irritable.  Respiratory compromise was clearly the result of her opioid overdose. [CF]  0255 Labs are all reassuring with no evidence of alcohol ingestion, coingestion of acetaminophen or salicylates, normal CBC, normal lipase, normal metabolic panel.  Will continue to monitor the patient given her ongoing opioid overdose.  She has calmed down after the initial reaction to Narcan. [CF]  [phone] Patient again asleep with 2 L of supplemental oxygen by nasal cannula, satting 89 to 90%. [CF]  [phone] Patient becoming increasingly hypoxic with decreased respiratory effort.  At this point she has received 2 bolus doses of Narcan and is still not adequately breathing and protecting her airway.  I am starting a Narcan infusion at 0.5 mg/h and will consult the hospitalist for  admission for an extended period of observation until the opioids have adequately metabolized. [CF]  0408 Patient dropped down to 76% despite supplementary oxygen.  However, after an additional dose of Narcan 0.2mg  IV she is now awake, alert, yelling, and refusing additional medication.  I  had a very honest and frank conversation about her opioid overdose and the consequences of going untreated, and I explained that I would need to intubate her if she continues on the past she is going and refusing medication.  She has calm to down after our talk and I am proceeding with the Narcan infusion. [CF]  [phone] Patient still vomiting copiously.  Ordered Zofran 4 mg IV and also ordered droperidol 2.5 mg IV because I think this will be effective not only as an antiemetic but as a calming agent [CF]  0446 Consulted Dr. Para March with the hospitalist service.  She will admit the patient. [CF]    Clinical Course User Index [CF] Loleta Rose, MD     FINAL CLINICAL IMPRESSION(S) / ED DIAGNOSES   Final diagnoses:  Opiate overdose, accidental or unintentional, initial encounter (HCC)  Acute respiratory failure with hypoxia (HCC)     Rx / DC Orders   ED Discharge Orders     None        Note:  This document was prepared using Dragon voice recognition software and may include unintentional dictation errors.   Loleta Rose, MD 03/20/23 [phone]

## 2023-03-20 NOTE — Assessment & Plan Note (Signed)
Complicating factor to overall prognosis and care 

## 2023-03-20 NOTE — H&P (Signed)
History and Physical    Patient: Sarah Sosa XBM:[id number] DOB: 1981-01-28 DOA: 03/20/2023 DOS: the patient was seen and examined on 03/20/2023 PCP: Clinic, [facility]  Patient coming from: Home  Chief Complaint:  Chief Complaint  Patient presents with   Drug Overdose    HPI: Sarah Sosa is a 42 y.o. female with medical history significant for asthma,  hypertension, alcohol use disorder, HFpEF, chronic pain on methadone and oxycodone from the [facility] pain [facility], peripheral neuropathy and recurrent alcoholic pancreatitis, was brought to the ED by EMS after accidental overdose.  Patient was apparently vomiting and took repeat doses of her oxycodone as she thought she had vomited up her prior doses.  At some point she became unresponsive.  She responded to 2 mg Narcan intranasally by EMS.  Following arrival in the ED she repeatedly became unresponsive again requiring another dose of Narcan, then Narcan infusion and an additional Narcan bolus after she was placed on the infusion.  At some point she started vomiting and was treated with droperidol.  Hospitalist consulted for admission at this point. Additional ED data review: Intermittently tachycardic in the ED, hypoxic to as low as 68% during periods of unresponsiveness and apnea requiring O2 at 4 L currently Labs: UDS not yet done.  EtOH, salicylate and acetaminophen levels undetectable.  Lipase and LFTs WNL.  CBC WNL.EKG EKG, personally viewed and interpreted showing sinus at 98 with no acute ST-T wave changes. Acute abdominal series nonacute Patient will be admitted to stepdown     Past Medical History:  Diagnosis Date   Arthritis    Asthma    Chronic pain disorder 03/19/2018   GERD (gastroesophageal reflux disease)    Headache    Heart murmur    History of trichomoniasis 03/19/2018   Hypertension    Intractable nausea and vomiting    Pancreatitis    PID (pelvic inflammatory disease) 02/14/2018   Uterine leiomyoma 03/19/2018    Past Surgical History:  Procedure Laterality Date   ABDOMINAL HYSTERECTOMY     AORTIC VALVE REPLACEMENT  03/2022   CHOLECYSTECTOMY     DENTAL SURGERY     HYSTERECTOMY ABDOMINAL WITH SALPINGECTOMY Bilateral 04/15/2018   Procedure: HYSTERECTOMY ABDOMINAL WITH BILATERAL SALPINGECTOMY;  Surgeon: Hildred Laser, MD;  Location: [facility] ORS;  Service: Gynecology;  Laterality: Bilateral;   kenn surgery Bilateral    KNEE ARTHROSCOPY Left 2012, 2013   Social History:  reports that she has been smoking cigarettes. She has a 11 pack-year smoking history. She has never used smokeless tobacco. She reports that she does not currently use alcohol. She reports current drug use. Drug: Marijuana.  Allergies  Allergen Reactions   Latex Anaphylaxis, Hives and Itching    Other reaction(s): Other (See Comments) SKIN BURNING & PEELING    Nsaids Other (See Comments)    Serve ulcers; stomach aches, GERD.  Other Reaction(s): Other (See Comments)   Tomato Anaphylaxis, Hives and Itching   Gabapentin Palpitations    Has heart condition; also makes jittery   Metformin     Other Reaction(s): Abdominal Pain  Causes pancreatitis to worsen   Lisinopril     Other Reaction(s): Angioedema   Tramadol Itching and Hives   Duloxetine Anxiety    Makes her jittery    Family History  Problem Relation Age of Onset   Fibroids Mother    Hypertension Mother    Arthritis Mother     Prior to Admission medications   Medication Sig Start Date End Date Taking? Authorizing  Provider  amLODipine (NORVASC) 10 MG tablet Take 1 tablet (10 mg total) by mouth daily. Blood pressure medication. 06/08/20 11/20/21  Darlin Priestly, MD  amoxicillin (AMOXIL) 875 MG tablet Take 1 tablet (875 mg total) by mouth 2 (two) times daily. 01/30/22   Cuthriell, Delorise Royals, PA-C  bisacodyl (DULCOLAX) 5 MG EC tablet Take 1 tablet (5 mg total) by mouth daily as needed for moderate constipation. 08/24/22 08/24/23  Orvil Feil, PA-C  carvedilol (COREG) 25 MG  tablet Take 1 tablet by mouth in the morning and at bedtime. 09/27/21   [provider]  chlorhexidine (PERIDEX) 0.12 % solution Use as directed 15 mLs in the mouth or throat 2 (two) times daily. 11/14/21   [provider]  dicyclomine (BENTYL) 10 MG capsule Take 1 capsule (10 mg total) by mouth 4 (four) times daily -  before meals and at bedtime. 03/11/23   Cuthriell, Delorise Royals, PA-C  feeding supplement (ENSURE ENLIVE / ENSURE PLUS) LIQD Take 237 mLs by mouth 2 (two) times daily between meals. 11/27/21   Alford Highland, MD  folic acid (FOLVITE) 1 MG tablet Take 1 tablet (1 mg total) by mouth daily. 11/27/21   Alford Highland, MD  furosemide (LASIX) 40 MG tablet Take 40-80 mg by mouth daily as needed. 09/14/21   [provider]  lidocaine (LIDODERM) 5 % Place 1 patch onto the skin every 12 (twelve) hours. Remove & Discard patch within 12 hours or as directed by MD 05/23/22 05/23/23  Chesley Noon, MD  lipase/protease/amylase (CREON) 36000 UNITS CPEP capsule Take 2 capsules (72,000 Units total) by mouth 3 (three) times daily before meals. 1 capsule with snacks 11/27/21   Alford Highland, MD  loperamide (IMODIUM A-D) 2 MG tablet Take 1 tablet (2 mg total) by mouth 4 (four) times daily as needed. 03/11/23   Cuthriell, Delorise Royals, PA-C  Magnesium Oxide 400 MG CAPS Take 1 capsule (400 mg total) by mouth daily. 11/27/21   Alford Highland, MD  melatonin 3 MG TABS tablet Take 3 mg by mouth at bedtime.    [provider]  Multiple Vitamin (MULTIVITAMIN WITH MINERALS) TABS tablet Take 1 tablet by mouth daily. 11/27/21   Alford Highland, MD  ondansetron (ZOFRAN-ODT) 4 MG disintegrating tablet Take 1 tablet (4 mg total) by mouth every 8 (eight) hours as needed. 03/11/23   Cuthriell, Delorise Royals, PA-C  Oxycodone HCl 10 MG TABS Take 1 tablet (10 mg total) by mouth every 6 (six) hours as needed. 02/09/22   Willy Eddy, MD  pantoprazole (PROTONIX) 40 MG tablet Take 1 tablet (40 mg  total) by mouth daily. 10/08/21 10/08/22  Minna Antis, MD  potassium chloride (KLOR-CON M) 10 MEQ tablet Take 1 tablet (10 mEq total) by mouth daily. 11/27/21   Alford Highland, MD  pregabalin (LYRICA) 75 MG capsule Take 75 mg by mouth 2 (two) times daily. 10/26/21   [provider]  spironolactone (ALDACTONE) 50 MG tablet Take 50 mg by mouth daily. 02/03/22   [provider]  sucralfate (CARAFATE) 1 g tablet Take 1 tablet (1 g total) by mouth 4 (four) times daily. 10/08/21 10/08/22  Minna Antis, MD  sucralfate (CARAFATE) 1 GM/10ML suspension Take 10 mLs (1 g total) by mouth 4 (four) times daily. 11/15/22 11/15/23  Delton Prairie, MD  thiamine 100 MG tablet Take 1 tablet (100 mg total) by mouth daily. 11/27/21   Alford Highland, MD  traZODone (DESYREL) 50 MG tablet Take 50 mg by mouth at bedtime. 09/07/21  [provider]  valsartan (DIOVAN) 320 MG tablet Take 320 mg by mouth daily. 09/14/21   [provider]    Physical Exam: Vitals:   03/20/23 0415 03/20/23 0420 03/20/23 0425 03/20/23 0430  BP:    (!) 195/90  Pulse: (!) 101 96 86 87  Resp: 13 17 (!) 9 (!) 28  Temp:      TempSrc:      SpO2: 91% 96% 96% 94%  Weight:      Height:       Physical Exam Vitals and nursing note reviewed.  Constitutional:      General: She is not in acute distress.    Comments: Very somnolent but arousable  HENT:     Head: Normocephalic and atraumatic.  Cardiovascular:     Rate and Rhythm: Normal rate and regular rhythm.     Heart sounds: Normal heart sounds.  Pulmonary:     Effort: Pulmonary effort is normal.     Breath sounds: Normal breath sounds.  Abdominal:     Palpations: Abdomen is soft.     Tenderness: There is no abdominal tenderness.  Neurological:     Comments: Very somnolent but arousable     Labs on Admission: I have personally reviewed following labs and imaging studies  CBC: Recent Labs  Lab 03/20/23 0148  WBC 9.5  NEUTROABS 7.9*  HGB  11.8*  HCT 37.7  MCV 96.2  PLT 245   Basic Metabolic Panel: Recent Labs  Lab 03/20/23 0148  NA 138  K 3.7  CL 97*  CO2 33*  GLUCOSE 126*  BUN 11  CREATININE 0.82  CALCIUM 8.7*   GFR: Estimated Creatinine Clearance: 130 mL/min (by C-G formula based on SCr of 0.82 mg/dL). Liver Function Tests: Recent Labs  Lab 03/20/23 0148  AST 22  ALT 20  ALKPHOS 62  BILITOT 0.4  PROT 7.7  ALBUMIN 3.7   Recent Labs  Lab 03/20/23 0148  LIPASE 22   No results for input(s): "AMMONIA" in the last 168 hours. Coagulation Profile: No results for input(s): "INR", "PROTIME" in the last 168 hours. Cardiac Enzymes: No results for input(s): "CKTOTAL", "CKMB", "CKMBINDEX", "TROPONINI" in the last 168 hours. BNP (last 3 results) No results for input(s): "PROBNP" in the last 8760 hours. HbA1C: No results for input(s): "HGBA1C" in the last 72 hours. CBG: No results for input(s): "GLUCAP" in the last 168 hours. Lipid Profile: No results for input(s): "CHOL", "HDL", "LDLCALC", "TRIG", "CHOLHDL", "LDLDIRECT" in the last 72 hours. Thyroid Function Tests: No results for input(s): "TSH", "T4TOTAL", "FREET4", "T3FREE", "THYROIDAB" in the last 72 hours. Anemia Panel: No results for input(s): "VITAMINB12", "FOLATE", "FERRITIN", "TIBC", "IRON", "RETICCTPCT" in the last 72 hours. Urine analysis:    Component Value Date/Time   COLORURINE YELLOW (A) 02/26/2022 0108   APPEARANCEUR CLEAR (A) 02/26/2022 0108   LABSPEC >1.046 (H) 02/26/2022 0108   PHURINE 6.0 02/26/2022 0108   GLUCOSEU NEGATIVE 02/26/2022 0108   HGBUR NEGATIVE 02/26/2022 0108   BILIRUBINUR NEGATIVE 02/26/2022 0108   KETONESUR NEGATIVE 02/26/2022 0108   PROTEINUR NEGATIVE 02/26/2022 0108   NITRITE NEGATIVE 02/26/2022 0108   LEUKOCYTESUR NEGATIVE 02/26/2022 0108    Radiological Exams on Admission: DG Abdomen Acute W/Chest  Result Date: 03/20/2023 CLINICAL DATA:  Nausea and vomiting with mild dyspnea EXAM: DG ABDOMEN ACUTE WITH 1  VIEW CHEST COMPARISON:  CT abdomen and pelvis 03/11/2023 chest radiograph 02/10/2023 FINDINGS: There is no evidence of dilated bowel loops. No evidence of free intraperitoneal air on  decubitus view. Cardiomegaly. Sternotomy. AVR. Limits. Both lungs are clear. IMPRESSION: Negative abdominal radiographs.  No acute cardiopulmonary disease. Electronically Signed   By: Minerva Fester M.D.   On: 03/20/2023 01:30     Data Reviewed: Relevant notes from primary care and specialist visits, past discharge summaries as available in EHR, including Care Everywhere. Prior diagnostic testing as pertinent to current admission diagnoses Updated medications and problem lists for reconciliation ED course, including vitals, labs, imaging, treatment and response to treatment Triage notes, nursing and pharmacy notes and ED provider's notes Notable results as noted in HPI   Assessment and Plan: * Opioid overdose, accidental or unintentional, initial encounter (HCC) Acute respiratory failure with hypoxia Continue Narcan infusion Supplemental oxygen to keep sats over 94% Will keep n.p.o. for now Aspiration precautions Close monitoring in stepdown  (HFpEF) heart failure with preserved ejection fraction (HCC) Appears compensated To resume home GDMT when more alert and able to swallow  Essential hypertension Hydralazine IV as needed while n.p.o.  History of pancreatitis Lipase and LFTs WNL  Obesity, Class III, BMI 40-49.9 (morbid obesity) (HCC) Complicating factor to overall prognosis and care  Intractable nausea and vomiting IV antiemetics IV hydration  History of substance abuse (HCC) Follow UDS  Chronic pain disorder Follows at [facility] pain clinic and is on methadone and oxycodone Once safe to swallow can resume pregabalin gabapentin, methadone and caution with oxycodone      DVT prophylaxis: Lovenox  Consults: none  Advance Care Planning:   Code Status: Prior   Family Communication:  none  Disposition Plan: Back to previous home environment  Severity of Illness: The appropriate patient status for this patient is INPATIENT. Inpatient status is judged to be reasonable and necessary in order to provide the required intensity of service to ensure the patient's safety. The patient's presenting symptoms, physical exam findings, and initial radiographic and laboratory data in the context of their chronic comorbidities is felt to place them at high risk for further clinical deterioration. Furthermore, it is not anticipated that the patient will be medically stable for discharge from the hospital within 2 midnights of admission.   * I certify that at the point of admission it is my clinical judgment that the patient will require inpatient hospital care spanning beyond 2 midnights from the point of admission due to high intensity of service, high risk for further deterioration and high frequency of surveillance required.*  Author: Andris Baumann, MD 03/20/2023 5:07 AM  For on call review [email or web address].

## 2023-03-20 NOTE — ED Notes (Addendum)
Prior to narcan administration pt sleeping and lethargic. After narcan administration pt agitated but more alert.

## 2023-03-20 NOTE — Assessment & Plan Note (Signed)
Hydralazine IV as needed while n.p.o. 

## 2023-03-20 NOTE — Assessment & Plan Note (Signed)
Lipase and LFTs WNL

## 2023-03-20 NOTE — ED Notes (Signed)
SpO2 89% RA - placed pt on 2L [place].

## 2023-03-20 NOTE — ED Notes (Signed)
Patient transported to X-ray 

## 2023-03-20 NOTE — Assessment & Plan Note (Signed)
IV antiemetics IV hydration

## 2023-03-20 NOTE — ED Notes (Signed)
Pt responsive to voice. RN offered to reposition pt. Purwick placed on pt due to high fall risk unable to ambulate steadily.

## 2023-03-20 NOTE — ED Triage Notes (Signed)
Pt arrives via [facility] from home with CC of accidental overdose on hydrocodone. Pts family called EMS due to pt being apneic - EMS gave 2 mg narcan intranasally and temporarily bagged pt. In route. Pt alert and oriented x4 at this time.

## 2023-03-20 NOTE — ED Notes (Signed)
Narcan given. Pt Spo2 78% at the time. Pt awoke and encouraged to take deep breaths through her nose. Pt awake and glaring at RN and not answering questions. Pt noted to be thrashing arms and legs about in bed. Spo2 having difficulty reading due to pt movement. MD aware.

## 2023-03-21 DIAGNOSIS — T402X1A Poisoning by other opioids, accidental (unintentional), initial encounter: Secondary | ICD-10-CM

## 2023-03-21 LAB — PROTIME-INR
INR: 1.5 — ABNORMAL HIGH (ref 0.8–1.2)
Prothrombin Time: 18.2 s — ABNORMAL HIGH (ref 11.4–15.2)

## 2023-03-21 MED ORDER — BISACODYL 5 MG PO TBEC: 5.0000 mg | DELAYED_RELEASE_TABLET | Freq: Every day | ORAL | Status: DC | PRN

## 2023-03-21 MED ORDER — WARFARIN - PHARMACIST DOSING INPATIENT: Freq: Every day | Status: DC

## 2023-03-21 MED ORDER — SPIRONOLACTONE 25 MG PO TABS
50.0000 mg | ORAL_TABLET | Freq: Every day | ORAL | Status: DC
  Administered 2023-03-21 – 2023-03-22 (×2): 50 mg via ORAL
  Filled 2023-03-21 (×2): qty 2

## 2023-03-21 MED ORDER — MELATONIN 5 MG PO TABS
10.0000 mg | ORAL_TABLET | Freq: Every day | ORAL | Status: DC
  Administered 2023-03-21: 10 mg via ORAL
  Filled 2023-03-21: qty 2

## 2023-03-21 MED ORDER — METFORMIN HCL 500 MG PO TABS
500.0000 mg | ORAL_TABLET | Freq: Every day | ORAL | Status: DC
  Administered 2023-03-22: 500 mg via ORAL
  Filled 2023-03-21: qty 1

## 2023-03-21 MED ORDER — ASPIRIN 81 MG PO CHEW
81.0000 mg | CHEWABLE_TABLET | Freq: Every day | ORAL | Status: DC
  Administered 2023-03-21 – 2023-03-22 (×2): 81 mg via ORAL
  Filled 2023-03-21 (×2): qty 1

## 2023-03-21 MED ORDER — TRAZODONE HCL 100 MG PO TABS
100.0000 mg | ORAL_TABLET | Freq: Every day | ORAL | Status: DC
  Administered 2023-03-21: 100 mg via ORAL
  Filled 2023-03-21 (×2): qty 1

## 2023-03-21 MED ORDER — LOPERAMIDE HCL 2 MG PO CAPS: 2.0000 mg | ORAL_CAPSULE | Freq: Four times a day (QID) | ORAL | Status: DC | PRN

## 2023-03-21 MED ORDER — ONDANSETRON 4 MG PO TBDP: 4.0000 mg | ORAL_TABLET | Freq: Three times a day (TID) | ORAL | Status: DC | PRN

## 2023-03-21 MED ORDER — MELATONIN 5 MG PO TABS
5.0000 mg | ORAL_TABLET | Freq: Every day | ORAL | Status: DC
  Administered 2023-03-21: 5 mg via ORAL
  Filled 2023-03-21: qty 1

## 2023-03-21 MED ORDER — OXYCODONE HCL 5 MG PO TABS
10.0000 mg | ORAL_TABLET | Freq: Four times a day (QID) | ORAL | Status: DC | PRN
  Administered 2023-03-21 – 2023-03-22 (×2): 10 mg via ORAL
  Filled 2023-03-21 (×2): qty 2

## 2023-03-21 MED ORDER — TRAZODONE HCL 50 MG PO TABS
50.0000 mg | ORAL_TABLET | Freq: Every day | ORAL | Status: DC
  Administered 2023-03-21: 50 mg via ORAL
  Filled 2023-03-21: qty 1

## 2023-03-21 MED ORDER — FOLIC ACID 1 MG PO TABS
1.0000 mg | ORAL_TABLET | Freq: Every day | ORAL | Status: DC
  Administered 2023-03-21 – 2023-03-22 (×2): 1 mg via ORAL
  Filled 2023-03-21 (×2): qty 1

## 2023-03-21 MED ORDER — DICYCLOMINE HCL 10 MG PO CAPS: 10.0000 mg | ORAL_CAPSULE | Freq: Three times a day (TID) | ORAL | Status: DC | PRN

## 2023-03-21 MED ORDER — ACETAMINOPHEN 500 MG PO TABS: 1000.0000 mg | ORAL_TABLET | Freq: Three times a day (TID) | ORAL | Status: DC | PRN

## 2023-03-21 MED ORDER — ENSURE ENLIVE PO LIQD: 237.0000 mL | Freq: Two times a day (BID) | ORAL | Status: DC

## 2023-03-21 MED ORDER — BUPRENORPHINE HCL-NALOXONE HCL 8-2 MG SL SUBL
1.0000 | SUBLINGUAL_TABLET | Freq: Every day | SUBLINGUAL | Status: DC
  Administered 2023-03-21 – 2023-03-22 (×2): 1 via SUBLINGUAL
  Filled 2023-03-21 (×2): qty 1

## 2023-03-21 MED ORDER — AMITRIPTYLINE HCL 10 MG PO TABS: 10.0000 mg | ORAL_TABLET | Freq: Every evening | ORAL | Status: DC | PRN

## 2023-03-21 MED ORDER — THIAMINE MONONITRATE 100 MG PO TABS
100.0000 mg | ORAL_TABLET | Freq: Every day | ORAL | Status: DC
  Administered 2023-03-21 – 2023-03-22 (×2): 100 mg via ORAL
  Filled 2023-03-21 (×2): qty 1

## 2023-03-21 MED ORDER — WARFARIN SODIUM 6 MG PO TABS
9.0000 mg | ORAL_TABLET | Freq: Once | ORAL | Status: AC
  Administered 2023-03-21: 9 mg via ORAL
  Filled 2023-03-21: qty 1

## 2023-03-21 MED ORDER — METHADONE HCL 10 MG PO TABS
10.0000 mg | ORAL_TABLET | Freq: Four times a day (QID) | ORAL | Status: DC
  Administered 2023-03-21 – 2023-03-22 (×4): 10 mg via ORAL
  Filled 2023-03-21 (×4): qty 1

## 2023-03-21 MED ORDER — ATORVASTATIN CALCIUM 20 MG PO TABS
40.0000 mg | ORAL_TABLET | Freq: Every day | ORAL | Status: DC
  Administered 2023-03-21 – 2023-03-22 (×2): 40 mg via ORAL
  Filled 2023-03-21 (×2): qty 2

## 2023-03-21 MED ORDER — PANTOPRAZOLE SODIUM 40 MG PO TBEC
40.0000 mg | DELAYED_RELEASE_TABLET | Freq: Every day | ORAL | Status: DC
  Administered 2023-03-21 – 2023-03-22 (×2): 40 mg via ORAL
  Filled 2023-03-21 (×2): qty 1

## 2023-03-21 MED ORDER — ADULT MULTIVITAMIN W/MINERALS CH
1.0000 | ORAL_TABLET | Freq: Every day | ORAL | Status: DC
  Administered 2023-03-21 – 2023-03-22 (×2): 1 via ORAL
  Filled 2023-03-21 (×2): qty 1

## 2023-03-21 MED ORDER — MAGNESIUM OXIDE -MG SUPPLEMENT 400 (240 MG) MG PO TABS
400.0000 mg | ORAL_TABLET | Freq: Every day | ORAL | Status: DC
  Administered 2023-03-21 – 2023-03-22 (×2): 400 mg via ORAL
  Filled 2023-03-21 (×2): qty 1

## 2023-03-21 NOTE — TOC Initial Note (Addendum)
Transition of Care [facility]) - Initial/Assessment Note    Patient Details  Name: Sarah Sosa MRN: [id number] Date of Birth: December 29, 1980  Transition of Care [facility]) CM/SW Contact:    Kreg Shropshire, RN Phone Number: 03/21/2023, 9:28 AM  Clinical Narrative:                 Cm spoke to pt at the bedside.  Pt arrived from ED from: Home Caregiver Support: Father and children DME at Home: none Transportation: herself and family Previous Services: none HH/SNF Preference: none First Person of Contact: Calvin Mctigue Father PCP: assigned a new pcp at [facility]  Readmission prevention completed  Cm will continue to follow for toc needs and d/c planning.     Barriers to Discharge: Continued Medical Work up   Patient Goals and CMS Choice   CMS Medicare.gov Compare Post Acute Care list provided to:: Patient Choice offered to / list presented to : Patient      Expected Discharge Plan and Services     Post Acute Care Choice: Home Health Living arrangements for the past 2 months: Skilled Nursing Facility                                      Prior Living Arrangements/Services Living arrangements for the past 2 months: Skilled Nursing Facility Lives with:: Self, Minor Children, Relatives                   Activities of Daily Living Home Assistive Devices/Equipment: None ADL Screening (condition at time of admission) Patient's cognitive ability adequate to safely complete daily activities?: Yes Is the patient deaf or have difficulty hearing?: No Does the patient have difficulty seeing, even when wearing glasses/contacts?: No Does the patient have difficulty concentrating, remembering, or making decisions?: No Patient able to express need for assistance with ADLs?: Yes Does the patient have difficulty dressing or bathing?: No Independently performs ADLs?: Yes (appropriate for developmental age) Does the patient have difficulty walking or climbing stairs?:  No Weakness of Legs: None Weakness of Arms/Hands: None  Permission Sought/Granted                  Emotional Assessment Appearance:: Appears younger than stated age   Affect (typically observed): Calm Orientation: : Oriented to Self, Oriented to Place, Oriented to  Time, Oriented to Situation      Admission diagnosis:  Acute respiratory failure with hypoxia (HCC) [J96.01] Opiate overdose, accidental or unintentional, initial encounter (HCC) [T40.601A] Opioid overdose, accidental or unintentional, initial encounter (HCC) [T40.2X1A] Patient Active Problem List   Diagnosis Date Noted   Opioid overdose, accidental or unintentional, initial encounter (HCC) 03/20/2023   Vomiting 03/20/2023   Obesity, Class III, BMI 40-49.9 (morbid obesity) (HCC) 03/20/2023   History of pancreatitis 03/20/2023   Acute respiratory failure with hypoxia (HCC) 03/20/2023   Hypomagnesemia 11/23/2021   Essential hypertension 11/20/2021   GERD without esophagitis 11/20/2021   (HFpEF) heart failure with preserved ejection fraction (HCC) 11/20/2021   Peripheral neuropathy 11/20/2021   Asthma, chronic 11/20/2021   Acute alcoholic pancreatitis 11/20/2021   Alcohol abuse 11/20/2021   Acute pancreatitis 11/20/2021   Recurrent pancreatitis 06/04/2020   Intractable nausea and vomiting    Marijuana use    Hypertensive urgency 04/03/2020   Hypokalemia 04/03/2020   Tobacco abuse 04/03/2020   History of substance abuse (HCC) 04/03/2020   Ileus (HCC) 04/21/2018   Postoperative  ileus (HCC) 04/20/2018   Postoperative state 04/15/2018   Uterine leiomyoma 03/19/2018   Menorrhagia with regular cycle 03/19/2018   Chronic pain disorder 03/19/2018   Pelvic pain 03/19/2018   Primary hypertension 03/19/2018   PCP:  [facility]:   [facility] [place], [place] - [place], [place] - [address] [place] [address] Phone: [phone] Fax: [phone]  [facility] [address] [place], [place] - [address] [place] [address] Phone: [phone] Fax: [phone]     Social Determinants of Health (SDOH) Social History: SDOH Screenings   Food Insecurity: Patient Declined (03/20/2023)  Housing: Patient Declined (03/20/2023)  Transportation Needs: Patient Declined (03/20/2023)  Utilities: Patient Declined (03/20/2023)  Financial Resource Strain: Medium Risk (08/10/2021)   Received from [facility], [facility]  Social Connections: Moderately Isolated (06/05/2021)   Received from [facility], [facility]  Stress: Stress Concern Present (06/05/2021)   Received from [facility], [facility]  Tobacco Use: High Risk (03/20/2023)   SDOH Interventions:     Readmission Risk Interventions    03/21/2023    9:27 AM 11/25/2021    4:37 PM  Readmission Risk Prevention Plan  Transportation Screening Complete Complete  Medication Review (RN Care Manager) Complete Complete  PCP or Specialist appointment within 3-5 days of discharge Complete Complete  HRI or Home Care Consult Complete Complete  SW Recovery Care/Counseling Consult Complete Complete  Palliative Care Screening Not Applicable Not Applicable  Skilled Nursing Facility Not Applicable Not Applicable

## 2023-03-21 NOTE — Plan of Care (Signed)

## 2023-03-21 NOTE — Progress Notes (Signed)
PROGRESS NOTE    Sarah Sosa  ZOX:[id number] DOB: 06-04-1981 DOA: 03/20/2023 PCP: [facility]    Brief Narrative:  Sarah Sosa is a 42 y.o. female with medical history significant for asthma,  hypertension, alcohol use disorder, HFpEF, chronic pain on methadone and oxycodone from the [facility] pain clinic, peripheral neuropathy and recurrent alcoholic pancreatitis, was brought to the ED by EMS after accidental overdose.  She was found to have acute hypoxic respiratory failure from accidental opioid overdose.   9/4: Patient weaned off Narcan.  Mentating clearly.  Weaned off supplemental oxygen.  Tolerating p.o. diet.   Assessment & Plan:   Principal Problem:   Opioid overdose, accidental or unintentional, initial encounter [facility]) Active Problems:   Acute respiratory failure with hypoxia (HCC)   Essential hypertension   (HFpEF) heart failure with preserved ejection fraction (HCC)   Chronic pain disorder   History of substance abuse (HCC)   Intractable nausea and vomiting   Obesity, Class III, BMI 40-49.9 (morbid obesity) (HCC)   History of pancreatitis  * Opioid overdose, accidental or unintentional, initial encounter (HCC) Acute respiratory failure with hypoxia Narcan infusion weaned off.  Patient on room air.  Okay for diet.  Transfer out of stepdown.  Can restart home narcotic regimen   (HFpEF) heart failure with preserved ejection fraction (HCC) Appears compensated Restart home GDMT   Essential hypertension Restart home regimen   History of pancreatitis Lipase and LFTs WNL   Obesity, Class III, BMI 40-49.9 (morbid obesity) (HCC) Complicating factor to overall prognosis and care   Intractable nausea and vomiting Zoll.  As needed antiemetics   History of substance abuse (HCC) Follow UDS   Chronic pain disorder Follows at [facility] pain clinic and is on methadone and oxycodone Resume home regimen   DVT prophylaxis: Lovenox Code Status: Full Family  Communication: None Disposition Plan: Status is: Inpatient Remains inpatient appropriate because: Opioid overdose.  Recovering.  Anticipate discharge 9/5.   Level of care: Med-Surg  Consultants:  None  Procedures:  None  Antimicrobials: None   Subjective: Seen and examined.  No visible distress.  Does endorse abdominal pain attributed to chronic pancreatitis.  Objective: Vitals:   03/21/23 0500 03/21/23 0600 03/21/23 0800 03/21/23 1306  BP:   (!) 138/98 (!) 178/86  Pulse: (!) 57 62 61 62  Resp: 15 17 17 18   Temp:   98.2 F (36.8 C) 98.4 F (36.9 C)  TempSrc:   Oral Oral  SpO2: 94% 91% 94% 95%  Weight:      Height:        Intake/Output Summary (Last 24 hours) at 03/21/2023 1354 Last data filed at 03/21/2023 1200 Gross per 24 hour  Intake 3048.61 ml  Output 450 ml  Net 2598.61 ml   Filed Weights   03/20/23 0038 03/20/23 1435  Weight: 135.5 kg 127.4 kg    Examination:  General exam: Appears calm and comfortable  Respiratory system: Clear to auscultation. Respiratory effort normal. Cardiovascular system: S1-S2, RRR, no murmurs, no pedal edema Gastrointestinal system: Obese, NT/ND, normal bowel sounds Central nervous system: Alert and oriented. No focal neurological deficits. Extremities: Symmetric 5 x 5 power. Skin: No rashes, lesions or ulcers Psychiatry: Judgement and insight appear normal. Mood & affect appropriate.     Data Reviewed: I have personally reviewed following labs and imaging studies  CBC: Recent Labs  Lab 03/20/23 0148  WBC 9.5  NEUTROABS 7.9*  HGB 11.8*  HCT 37.7  MCV 96.2  PLT 245   Basic  Metabolic Panel: Recent Labs  Lab 03/20/23 0148  NA 138  K 3.7  CL 97*  CO2 33*  GLUCOSE 126*  BUN 11  CREATININE 0.82  CALCIUM 8.7*   GFR: Estimated Creatinine Clearance: 123.3 mL/min (by C-G formula based on SCr of 0.82 mg/dL). Liver Function Tests: Recent Labs  Lab 03/20/23 0148  AST 22  ALT 20  ALKPHOS 62  BILITOT 0.4  PROT  7.7  ALBUMIN 3.7   Recent Labs  Lab 03/20/23 0148  LIPASE 22   No results for input(s): "AMMONIA" in the last 168 hours. Coagulation Profile: Recent Labs  Lab 03/20/23 1624 03/21/23 0646  INR 2.0* 1.5*   Cardiac Enzymes: No results for input(s): "CKTOTAL", "CKMB", "CKMBINDEX", "TROPONINI" in the last 168 hours. BNP (last 3 results) No results for input(s): "PROBNP" in the last 8760 hours. HbA1C: No results for input(s): "HGBA1C" in the last 72 hours. CBG: Recent Labs  Lab 03/20/23 1433  GLUCAP 85   Lipid Profile: No results for input(s): "CHOL", "HDL", "LDLCALC", "TRIG", "CHOLHDL", "LDLDIRECT" in the last 72 hours. Thyroid Function Tests: No results for input(s): "TSH", "T4TOTAL", "FREET4", "T3FREE", "THYROIDAB" in the last 72 hours. Anemia Panel: No results for input(s): "VITAMINB12", "FOLATE", "FERRITIN", "TIBC", "IRON", "RETICCTPCT" in the last 72 hours. Sepsis Labs: No results for input(s): "PROCALCITON", "LATICACIDVEN" in the last 168 hours.  Recent Results (from the past 240 hour(s))  MRSA Next Gen by PCR, Nasal     Status: None   Collection Time: 03/20/23  2:39 PM   Specimen: Nasal Mucosa; Nasal Swab  Result Value Ref Range Status   MRSA by PCR Next Gen NOT DETECTED NOT DETECTED Final    Comment: (NOTE) The GeneXpert MRSA Assay (FDA approved for NASAL specimens only), is one component of a comprehensive MRSA colonization surveillance program. It is not intended to diagnose MRSA infection nor to guide or monitor treatment for MRSA infections. Test performance is not FDA approved in patients less than 11 years old. Performed at [facility], [address] [place], [place] [address]          Radiology Studies: DG Abdomen Acute W/Chest  Result Date: 03/20/2023 CLINICAL DATA:  Nausea and vomiting with mild dyspnea EXAM: DG ABDOMEN ACUTE WITH 1 VIEW CHEST COMPARISON:  CT abdomen and pelvis 03/11/2023 chest radiograph 02/10/2023 FINDINGS: There  is no evidence of dilated bowel loops. No evidence of free intraperitoneal air on decubitus view. Cardiomegaly. Sternotomy. AVR. Limits. Both lungs are clear. IMPRESSION: Negative abdominal radiographs.  No acute cardiopulmonary disease. Electronically Signed   By: Minerva Fester M.D.   On: 03/20/2023 01:30        Scheduled Meds:  Chlorhexidine Gluconate Cloth  6 each Topical Daily   enoxaparin (LOVENOX) injection  70 mg Subcutaneous Q24H   warfarin  9 mg Oral ONCE-1600   Warfarin - Pharmacist Dosing Inpatient   Does not apply q1600   Continuous Infusions:   LOS: 1 day       Tresa Moore, MD [facility]   If 7PM-7AM, please contact night-coverage  03/21/2023, 1:54 PM

## 2023-03-21 NOTE — Progress Notes (Signed)
No output charted on previous shift. Patient reports that she voided once earlier this morning.   9:24 AM 03/21/23 Bennetta Laos, RN

## 2023-03-21 NOTE — Progress Notes (Signed)
ANTICOAGULATION CONSULT NOTE  Pharmacy Consult for warfarin Indication: AVR  Allergies  Allergen Reactions   Latex Anaphylaxis, Hives and Itching    Other reaction(s): Other (See Comments) SKIN BURNING & PEELING    Nsaids Other (See Comments)    Serve ulcers; stomach aches, GERD.  Other Reaction(s): Other (See Comments)   Tomato Anaphylaxis, Hives and Itching   Gabapentin Palpitations    Has heart condition; also makes jittery   Metformin     Other Reaction(s): Abdominal Pain  Causes pancreatitis to worsen   Lisinopril     Other Reaction(s): Angioedema   Tramadol Itching and Hives   Duloxetine Anxiety    Makes her jittery    Patient Measurements: Height: 5\' 6"  (167.6 cm) Weight: 127.4 kg (280 lb 13.9 oz) IBW/kg (Calculated) : 59.3  Vital Signs: Temp: 98.2 F (36.8 C) (09/03 2050) Temp Source: Oral (09/03 2050) BP: 157/87 (09/04 0400) Pulse Rate: 62 (09/04 0600)  Labs: Recent Labs    03/20/23 0148 03/20/23 1624  HGB 11.8*  --   HCT 37.7  --   PLT 245  --   LABPROT  --  22.7*  INR  --  2.0*  CREATININE 0.82  --     Estimated Creatinine Clearance: 123.3 mL/min (by C-G formula based on SCr of 0.82 mg/dL).   Medical History: Past Medical History:  Diagnosis Date   Arthritis    Asthma    Chronic pain disorder 03/19/2018   GERD (gastroesophageal reflux disease)    Headache    Heart murmur    History of trichomoniasis 03/19/2018   Hypertension    Intractable nausea and vomiting    Pancreatitis    PID (pelvic inflammatory disease) 02/14/2018   Uterine leiomyoma 03/19/2018    Medications: PTA warfarin 3.75mg  M/W/F and 7.5mg   all other days, last dose 09/02 (average daily dose 5.9 mg)  Assessment: 42 y.o. female with medical history significant for asthma,  hypertension, alcohol use disorder, HFpEF, chronic pain on methadone and oxycodone from the [facility], peripheral neuropathy and recurrent alcoholic pancreatitis, was brought to the ED by EMS  after accidental overdose.   Goal of Therapy:  INR 2-3 Monitor platelets by anticoagulation protocol: Yes   Plan:  ---warfarin 9 mg po x 1 (50% greater than average daily dose given subtherapeutic INR) ---INR daily to guide warfarin dose  Lowella Bandy 03/21/2023,6:58 AM

## 2023-03-22 DIAGNOSIS — T402X1A Poisoning by other opioids, accidental (unintentional), initial encounter: Secondary | ICD-10-CM | POA: Diagnosis not present

## 2023-03-22 LAB — PROTIME-INR
INR: 1.4 — ABNORMAL HIGH (ref 0.8–1.2)
Prothrombin Time: 16.9 s — ABNORMAL HIGH (ref 11.4–15.2)

## 2023-03-22 MED ORDER — WARFARIN SODIUM 4 MG PO TABS: 4.5000 mg | ORAL_TABLET | Freq: Once | ORAL | Status: DC

## 2023-03-22 MED ORDER — WARFARIN SODIUM 6 MG PO TABS: 9.0000 mg | ORAL_TABLET | Freq: Once | ORAL | Status: DC

## 2023-03-22 NOTE — Progress Notes (Signed)
ANTICOAGULATION CONSULT NOTE  Pharmacy Consult for Warfarin Indication: mAVR (On-X)  Allergies  Allergen Reactions   Latex Anaphylaxis, Hives and Itching    Other reaction(s): Other (See Comments) SKIN BURNING & PEELING    Nsaids Other (See Comments)    Serve ulcers; stomach aches, GERD.  Other Reaction(s): Other (See Comments)   Tomato Anaphylaxis, Hives and Itching   Gabapentin Palpitations    Has heart condition; also makes jittery   Metformin     Other Reaction(s): Abdominal Pain  Causes pancreatitis to worsen   Lisinopril     Other Reaction(s): Angioedema   Tramadol Itching and Hives   Duloxetine Anxiety    Makes her jittery    Patient Measurements: Height: 5\' 6"  (167.6 cm) Weight: 127.4 kg (280 lb 13.9 oz) IBW/kg (Calculated) : 59.3  Vital Signs: Temp: 98.2 F (36.8 C) (09/05 0815) BP: 151/78 (09/05 0815) Pulse Rate: 58 (09/05 0815)  Labs: Recent Labs    03/20/23 0148 03/20/23 1624 03/21/23 0646 03/22/23 0422  HGB 11.8*  --   --   --   HCT 37.7  --   --   --   PLT 245  --   --   --   LABPROT  --  22.7* 18.2* 16.9*  INR  --  2.0* 1.5* 1.4*  CREATININE 0.82  --   --   --     Estimated Creatinine Clearance: 123.3 mL/min (by C-G formula based on SCr of 0.82 mg/dL).   Medical History: Past Medical History:  Diagnosis Date   Arthritis    Asthma    Chronic pain disorder 03/19/2018   GERD (gastroesophageal reflux disease)    Headache    Heart murmur    History of trichomoniasis 03/19/2018   Hypertension    Intractable nausea and vomiting    Pancreatitis    PID (pelvic inflammatory disease) 02/14/2018   Uterine leiomyoma 03/19/2018    Medications:  PTA Warfarin: 41.25 mg/wk 3.75 mg QMWF 7.5 mg QTuThSaSu   Assessment:  Sarah Sosa is a 42 y.o. female presenting with accidental overdose. PMH significant for asthma, HTN, AUD, HFpEF, chronic pain on methadone and oxycodone from the [facility] pain clinic, peripheral neuropathy, recurrent alcoholic  pancreatitis. Patient was on Warfarin PTA per chart review. Last dose of Warfarin PTA was on 9/2 (3.75 mg). Of note, INR was 2.0 on home regimen on arrival. Per [facility] note, goal INR 1.5-2. Pharmacy has been consulted to initiate and manage Warfarin.    Baseline Labs: PT 22.7, INR 2.0, Hgb 11.8, Hct 37.7, Plt 245    Goal of Therapy:  INR 1.5-2 Monitor platelets by anticoagulation protocol: Yes   Date    INR      Warfarin Dose    9/4 1.5 held 9/5 1.4 9 mg  Plan:  Give warfarin 4.5 mg x1 dose today  Anticipate INR increase over next few days in response to resuming warfarin Check INR daily until stable Check CBC at least weekly while on warfarin   Celene Squibb, PharmD Clinical Pharmacist 03/22/2023 8:35 AM

## 2023-03-22 NOTE — Progress Notes (Signed)
Patient is requesting for her home dose of Trazodone 100mg  and melatonin 10mg  instead of the ordered Trazone 50mg  and melatonin 5mg . Asked [facility] on call, Dr. Arville Care verbal ordered to resume Trazodone 100mg  and Melatonin 10mg .

## 2023-03-22 NOTE — Discharge Summary (Signed)
Physician Discharge Summary  Sarah Sosa ZOX:[id number] DOB: 07-31-1980 DOA: 03/20/2023  PCP: [facility], [facility]  Admit date: 03/20/2023 Discharge date: 03/22/2023  Admitted From: Home Disposition:  Home  Recommendations for Outpatient Follow-up:  Follow up with PCP in 1-2 weeks   Home Health:No Equipment/Devices:None   Discharge Condition:Stable  CODE STATUS:FULL  Diet recommendation: Heart  Brief/Interim Summary:  Sarah Sosa is a 42 y.o. female with medical history significant for asthma,  hypertension, alcohol use disorder, HFpEF, chronic pain on methadone and oxycodone from the [facility] pain [facility], peripheral neuropathy and recurrent alcoholic pancreatitis, was brought to the ED by EMS after accidental overdose.  She was found to have acute hypoxic respiratory failure from accidental opioid overdose.    9/4: Patient weaned off Narcan.  Mentating clearly.  Weaned off supplemental oxygen.  Tolerating p.o. diet.  9/5: Off oxygen.  Tolerating p.o. intake.  No nausea vomiting abdominal pain.  Stable for discharge.  No changes made to narcotic regimen    Discharge Diagnoses:  Principal Problem:   Opioid overdose, accidental or unintentional, initial encounter [facility]) Active Problems:   Acute respiratory failure with hypoxia (HCC)   Essential hypertension   (HFpEF) heart failure with preserved ejection fraction (HCC)   Chronic pain disorder   History of substance abuse (HCC)   Intractable nausea and vomiting   Obesity, Class III, BMI 40-49.9 (morbid obesity) (HCC)   History of pancreatitis * Opioid overdose, accidental or unintentional, initial encounter (HCC) Acute respiratory failure with hypoxia Initially required Narcan infusion.  Now weaned off.  Patient on room air and tolerating diet at time of discharge.  Stable for DC home.  Can restart home on a chronic regimen   (HFpEF) heart failure with preserved ejection fraction (HCC) Appears compensated Restart home GDMT    Essential hypertension Restart home regimen   History of pancreatitis Lipase and LFTs WNL   Obesity, Class III, BMI 40-49.9 (morbid obesity) (HCC) Complicating factor to overall prognosis and care   Chronic pain disorder Follows at [facility] pain clinic and is on Suboxone, methadone and oxycodone Resume home regimen   Discharge Instructions  Discharge Instructions     Diet - low sodium heart healthy   Complete by: As directed    Increase activity slowly   Complete by: As directed       Allergies as of 03/22/2023       Reactions   Latex Anaphylaxis, Hives, Itching   Other reaction(s): Other (See Comments) SKIN BURNING & PEELING   Nsaids Other (See Comments)   Serve ulcers; stomach aches, GERD. Other Reaction(s): Other (See Comments)   Tomato Anaphylaxis, Hives, Itching   Gabapentin Palpitations   Has heart condition; also makes jittery   Metformin    Other Reaction(s): Abdominal Pain Causes pancreatitis to worsen   Lisinopril    Other Reaction(s): Angioedema   Tramadol Itching, Hives   Duloxetine Anxiety   Makes her jittery        Medication List     TAKE these medications    acetaminophen 500 MG tablet Commonly known as: TYLENOL Take 1,000 mg by mouth 3 (three) times daily as needed for mild pain.   amitriptyline 10 MG tablet Commonly known as: ELAVIL Take 10 mg by mouth 3 (three) times daily as needed.   aspirin 81 MG chewable tablet Chew 81 mg by mouth daily.   atorvastatin 40 MG tablet Commonly known as: LIPITOR Take 40 mg by mouth daily.   bisacodyl 5 MG EC tablet Commonly  known as: Dulcolax Take 1 tablet (5 mg total) by mouth daily as needed for moderate constipation.   Buprenorphine HCl-Naloxone HCl 8-2 MG Film Place 1 Film under the tongue 4 (four) times daily as needed.   chlorhexidine 0.12 % solution Commonly known as: PERIDEX Use as directed 15 mLs in the mouth or throat 2 (two) times daily.   dicyclomine 10 MG capsule Commonly  known as: BENTYL Take 1 capsule (10 mg total) by mouth 4 (four) times daily -  before meals and at bedtime.   feeding supplement Liqd Take 237 mLs by mouth 2 (two) times daily between meals.   folic acid 1 MG tablet Commonly known as: FOLVITE Take 1 tablet (1 mg total) by mouth daily.   furosemide 40 MG tablet Commonly known as: LASIX Take 40-80 mg by mouth daily as needed.   lidocaine 5 % Commonly known as: Lidoderm Place 1 patch onto the skin every 12 (twelve) hours. Remove & Discard patch within 12 hours or as directed by MD   loperamide 2 MG tablet Commonly known as: IMODIUM A-D Take 1 tablet (2 mg total) by mouth 4 (four) times daily as needed.   Magnesium Oxide 400 MG Caps Take 1 capsule (400 mg total) by mouth daily.   melatonin 3 MG Tabs tablet Take 3 mg by mouth at bedtime.   metFORMIN 500 MG tablet Commonly known as: GLUCOPHAGE Take 500 mg by mouth every morning.   methadone 10 MG tablet Commonly known as: DOLOPHINE Take 10 mg by mouth 4 (four) times daily.   multivitamin with minerals Tabs tablet Take 1 tablet by mouth daily.   naloxone 4 MG/0.1ML Liqd nasal spray kit Commonly known as: NARCAN Place 1 spray into the nose once as needed (to reverse overdose).   omeprazole 40 MG capsule Commonly known as: PRILOSEC Take 40 mg by mouth daily.   ondansetron 4 MG disintegrating tablet Commonly known as: ZOFRAN-ODT Take 1 tablet (4 mg total) by mouth every 8 (eight) hours as needed.   Oxycodone HCl 10 MG Tabs Take 1 tablet (10 mg total) by mouth every 6 (six) hours as needed.   pantoprazole 40 MG tablet Commonly known as: Protonix Take 1 tablet (40 mg total) by mouth daily.   spironolactone 50 MG tablet Commonly known as: ALDACTONE Take 50 mg by mouth daily.   sucralfate 1 g tablet Commonly known as: Carafate Take 1 tablet (1 g total) by mouth 4 (four) times daily.   thiamine 100 MG tablet Commonly known as: VITAMIN B1 Take 1 tablet (100 mg  total) by mouth daily.   traZODone 50 MG tablet Commonly known as: DESYREL Take 50 mg by mouth at bedtime.   warfarin 7.5 MG tablet Commonly known as: COUMADIN Take 3.25-7.5 mg by mouth daily. Take 3.75mg  (1/2 tab) M/W/F and 7.5mg  (1 tab) all other days        Follow-up Information     [facility]. Go on 03/23/2023.   Why: Loralie Champagne, MD;  Appt @ 10:45 am Contact information: [address] [place] [address] [phone]                Allergies  Allergen Reactions   Latex Anaphylaxis, Hives and Itching    Other reaction(s): Other (See Comments) SKIN BURNING & PEELING    Nsaids Other (See Comments)    Serve ulcers; stomach aches, GERD.  Other Reaction(s): Other (See Comments)   Tomato Anaphylaxis, Hives and Itching  Gabapentin Palpitations    Has heart condition; also makes jittery   Metformin     Other Reaction(s): Abdominal Pain  Causes pancreatitis to worsen   Lisinopril     Other Reaction(s): Angioedema   Tramadol Itching and Hives   Duloxetine Anxiety    Makes her jittery    Consultations: ICU   Procedures/Studies: DG Abdomen Acute W/Chest  Result Date: 03/20/2023 CLINICAL DATA:  Nausea and vomiting with mild dyspnea EXAM: DG ABDOMEN ACUTE WITH 1 VIEW CHEST COMPARISON:  CT abdomen and pelvis 03/11/2023 chest radiograph 02/10/2023 FINDINGS: There is no evidence of dilated bowel loops. No evidence of free intraperitoneal air on decubitus view. Cardiomegaly. Sternotomy. AVR. Limits. Both lungs are clear. IMPRESSION: Negative abdominal radiographs.  No acute cardiopulmonary disease. Electronically Signed   By: Minerva Fester M.D.   On: 03/20/2023 01:30   CT ABDOMEN PELVIS W CONTRAST  Result Date: 03/11/2023 CLINICAL DATA:  For approximately 1 week. Nausea and vomiting. Diarrhea. History of pancreatitis. EXAM: CT ABDOMEN AND PELVIS WITH CONTRAST TECHNIQUE: Multidetector CT imaging of the abdomen and pelvis was performed  using the standard protocol following bolus administration of intravenous contrast. RADIATION DOSE REDUCTION: This exam was performed according to the departmental dose-optimization program which includes automated exposure control, adjustment of the mA and/or kV according to patient size and/or use of iterative reconstruction technique. CONTRAST:  OMNIPAQUE IOHEXOL 350 MG/ML SOLN COMPARISON:  11/15/2022 FINDINGS: Lower Chest: No acute findings. Hepatobiliary:  No suspicious hepatic masses identified. Pancreas: Normal appearance. No evidence of mass or inflammatory changes. No evidence of pancreatic calcification or ductal dilatation. Spleen: Within normal limits in size and appearance. Adrenals/Urinary Tract: No suspicious masses identified. No evidence of ureteral calculi or hydronephrosis. Stomach/Bowel: No evidence of obstruction, inflammatory process or abnormal fluid collections. Vascular/Lymphatic: No pathologically enlarged lymph nodes. No acute vascular findings. Reproductive: Prior hysterectomy again noted. A new 2.9 x 2.7 cm intermediate attenuation lesion is seen in the left adnexa. This likely represents a hemorrhagic cyst or possibly endometrioma in a reproductive age female. No evidence of inflammatory changes or free fluid. Other:  None. Musculoskeletal:  No suspicious bone lesions identified. IMPRESSION: No radiographic evidence of pancreatitis. New 2.9 cm intermediate attenuation lesion in left adnexa, likely representing a hemorrhagic cyst or possibly endometrioma in a reproductive age female. Recommend follow-up with pelvic ultrasound in 6-8 weeks. Electronically Signed   By: Danae Orleans M.D.   On: 03/11/2023 17:52      Subjective: Seen and examined on the day of discharge.  Stable no distress.  Tolerating p.o. intake.  No nausea vomiting abdominal pain.  Stable for discharge home.  Discharge Exam: Vitals:   03/21/23 2341 03/22/23 0815  BP: (!) 155/79 (!) 151/78  Pulse: (!) 57  (!) 58  Resp: 20 16  Temp: 98.9 F (37.2 C) 98.2 F (36.8 C)  SpO2: 92% 92%   Vitals:   03/21/23 1551 03/21/23 1700 03/21/23 2341 03/22/23 0815  BP: (!) 151/84  (!) 155/79 (!) 151/78  Pulse: (!) 57  (!) 57 (!) 58  Resp: 18  20 16   Temp: 98 F (36.7 C)  98.9 F (37.2 C) 98.2 F (36.8 C)  TempSrc:      SpO2: (!) 87% 100% 92% 92%  Weight:      Height:        General: Pt is alert, awake, not in acute distress Cardiovascular: RRR, S1/S2 +, no rubs, no gallops Respiratory: CTA bilaterally, no wheezing, no rhonchi Abdominal: Soft, NT,  ND, bowel sounds + Extremities: no edema, no cyanosis    The results of significant diagnostics from this hospitalization (including imaging, microbiology, ancillary and laboratory) are listed below for reference.     Microbiology: Recent Results (from the past 240 hour(s))  MRSA Next Gen by PCR, Nasal     Status: None   Collection Time: 03/20/23  2:39 PM   Specimen: Nasal Mucosa; Nasal Swab  Result Value Ref Range Status   MRSA by PCR Next Gen NOT DETECTED NOT DETECTED Final    Comment: (NOTE) The GeneXpert MRSA Assay (FDA approved for NASAL specimens only), is one component of a comprehensive MRSA colonization surveillance program. It is not intended to diagnose MRSA infection nor to guide or monitor treatment for MRSA infections. Test performance is not FDA approved in patients less than 91 years old. Performed at [facility], [address] [place], [place] [address]      Labs: BNP (last 3 results) No results for input(s): "BNP" in the last 8760 hours. Basic Metabolic Panel: Recent Labs  Lab 03/20/23 0148  NA 138  K 3.7  CL 97*  CO2 33*  GLUCOSE 126*  BUN 11  CREATININE 0.82  CALCIUM 8.7*   Liver Function Tests: Recent Labs  Lab 03/20/23 0148  AST 22  ALT 20  ALKPHOS 62  BILITOT 0.4  PROT 7.7  ALBUMIN 3.7   Recent Labs  Lab 03/20/23 0148  LIPASE 22   No results for input(s): "AMMONIA" in the  last 168 hours. CBC: Recent Labs  Lab 03/20/23 0148  WBC 9.5  NEUTROABS 7.9*  HGB 11.8*  HCT 37.7  MCV 96.2  PLT 245   Cardiac Enzymes: No results for input(s): "CKTOTAL", "CKMB", "CKMBINDEX", "TROPONINI" in the last 168 hours. BNP: Invalid input(s): "POCBNP" CBG: Recent Labs  Lab 03/20/23 1433  GLUCAP 85   D-Dimer No results for input(s): "DDIMER" in the last 72 hours. Hgb A1c No results for input(s): "HGBA1C" in the last 72 hours. Lipid Profile No results for input(s): "CHOL", "HDL", "LDLCALC", "TRIG", "CHOLHDL", "LDLDIRECT" in the last 72 hours. Thyroid function studies No results for input(s): "TSH", "T4TOTAL", "T3FREE", "THYROIDAB" in the last 72 hours.  Invalid input(s): "FREET3" Anemia work up No results for input(s): "VITAMINB12", "FOLATE", "FERRITIN", "TIBC", "IRON", "RETICCTPCT" in the last 72 hours. Urinalysis    Component Value Date/Time   COLORURINE YELLOW (A) 02/26/2022 0108   APPEARANCEUR CLEAR (A) 02/26/2022 0108   LABSPEC >1.046 (H) 02/26/2022 0108   PHURINE 6.0 02/26/2022 0108   GLUCOSEU NEGATIVE 02/26/2022 0108   HGBUR NEGATIVE 02/26/2022 0108   BILIRUBINUR NEGATIVE 02/26/2022 0108   KETONESUR NEGATIVE 02/26/2022 0108   PROTEINUR NEGATIVE 02/26/2022 0108   NITRITE NEGATIVE 02/26/2022 0108   LEUKOCYTESUR NEGATIVE 02/26/2022 0108   Sepsis Labs Recent Labs  Lab 03/20/23 0148  WBC 9.5   Microbiology Recent Results (from the past 240 hour(s))  MRSA Next Gen by PCR, Nasal     Status: None   Collection Time: 03/20/23  2:39 PM   Specimen: Nasal Mucosa; Nasal Swab  Result Value Ref Range Status   MRSA by PCR Next Gen NOT DETECTED NOT DETECTED Final    Comment: (NOTE) The GeneXpert MRSA Assay (FDA approved for NASAL specimens only), is one component of a comprehensive MRSA colonization surveillance program. It is not intended to diagnose MRSA infection nor to guide or monitor treatment for MRSA infections. Test performance is not FDA  approved in patients less than 34 years old. Performed  at [facility], [address], [place], [place] [address]      Time coordinating discharge: Over 30 minutes  SIGNED:   Tresa Moore, MD  [facility] 03/22/2023, 11:00 AM Pager   If 7PM-7AM, please contact night-coverage

## 2023-03-26 NOTE — Group Note (Deleted)

## 2023-07-12 ENCOUNTER — Inpatient Hospital Stay
Admission: EM | Admit: 2023-07-12 | Discharge: 2023-07-18 | DRG: 438 | Disposition: A | Discharge status: Home / Self Care | Payer: Medicaid Other | Attending: Internal Medicine | Admitting: Internal Medicine

## 2023-07-12 ENCOUNTER — Emergency Department: Payer: Medicaid Other

## 2023-07-12 ENCOUNTER — Other Ambulatory Visit: Payer: Self-pay

## 2023-07-12 ENCOUNTER — Encounter: Payer: Self-pay | Admitting: *Deleted

## 2023-07-12 DIAGNOSIS — E785 Hyperlipidemia, unspecified: Secondary | ICD-10-CM | POA: Diagnosis present

## 2023-07-12 DIAGNOSIS — E876 Hypokalemia: Secondary | ICD-10-CM | POA: Diagnosis present

## 2023-07-12 DIAGNOSIS — K852 Alcohol induced acute pancreatitis without necrosis or infection: Principal | ICD-10-CM | POA: Diagnosis present

## 2023-07-12 DIAGNOSIS — K219 Gastro-esophageal reflux disease without esophagitis: Secondary | ICD-10-CM | POA: Diagnosis present

## 2023-07-12 DIAGNOSIS — Z90710 Acquired absence of both cervix and uterus: Secondary | ICD-10-CM

## 2023-07-12 DIAGNOSIS — R791 Abnormal coagulation profile: Principal | ICD-10-CM

## 2023-07-12 DIAGNOSIS — Z91040 Latex allergy status: Secondary | ICD-10-CM

## 2023-07-12 DIAGNOSIS — Z7982 Long term (current) use of aspirin: Secondary | ICD-10-CM

## 2023-07-12 DIAGNOSIS — Z888 Allergy status to other drugs, medicaments and biological substances status: Secondary | ICD-10-CM

## 2023-07-12 DIAGNOSIS — Z7984 Long term (current) use of oral hypoglycemic drugs: Secondary | ICD-10-CM

## 2023-07-12 DIAGNOSIS — Z5986 Financial insecurity: Secondary | ICD-10-CM

## 2023-07-12 DIAGNOSIS — Z713 Dietary counseling and surveillance: Secondary | ICD-10-CM

## 2023-07-12 DIAGNOSIS — I5032 Chronic diastolic (congestive) heart failure: Secondary | ICD-10-CM | POA: Diagnosis present

## 2023-07-12 DIAGNOSIS — K861 Other chronic pancreatitis: Secondary | ICD-10-CM

## 2023-07-12 DIAGNOSIS — Z952 Presence of prosthetic heart valve: Secondary | ICD-10-CM

## 2023-07-12 DIAGNOSIS — E66813 Obesity, class 3: Secondary | ICD-10-CM | POA: Diagnosis present

## 2023-07-12 DIAGNOSIS — Z6841 Body Mass Index (BMI) 40.0 and over, adult: Secondary | ICD-10-CM

## 2023-07-12 DIAGNOSIS — Z79899 Other long term (current) drug therapy: Secondary | ICD-10-CM

## 2023-07-12 DIAGNOSIS — J189 Pneumonia, unspecified organism: Secondary | ICD-10-CM

## 2023-07-12 DIAGNOSIS — F17210 Nicotine dependence, cigarettes, uncomplicated: Secondary | ICD-10-CM | POA: Diagnosis present

## 2023-07-12 DIAGNOSIS — I5030 Unspecified diastolic (congestive) heart failure: Secondary | ICD-10-CM | POA: Diagnosis present

## 2023-07-12 DIAGNOSIS — I7121 Aneurysm of the ascending aorta, without rupture: Secondary | ICD-10-CM | POA: Diagnosis present

## 2023-07-12 DIAGNOSIS — K860 Alcohol-induced chronic pancreatitis: Secondary | ICD-10-CM | POA: Diagnosis present

## 2023-07-12 DIAGNOSIS — Z7901 Long term (current) use of anticoagulants: Secondary | ICD-10-CM

## 2023-07-12 DIAGNOSIS — I21A1 Myocardial infarction type 2: Secondary | ICD-10-CM | POA: Diagnosis present

## 2023-07-12 DIAGNOSIS — K8681 Exocrine pancreatic insufficiency: Secondary | ICD-10-CM | POA: Diagnosis present

## 2023-07-12 DIAGNOSIS — Z8249 Family history of ischemic heart disease and other diseases of the circulatory system: Secondary | ICD-10-CM

## 2023-07-12 DIAGNOSIS — R112 Nausea with vomiting, unspecified: Secondary | ICD-10-CM | POA: Diagnosis present

## 2023-07-12 DIAGNOSIS — Z91018 Allergy to other foods: Secondary | ICD-10-CM

## 2023-07-12 DIAGNOSIS — F1010 Alcohol abuse, uncomplicated: Secondary | ICD-10-CM | POA: Diagnosis present

## 2023-07-12 DIAGNOSIS — I110 Hypertensive heart disease with heart failure: Secondary | ICD-10-CM | POA: Diagnosis present

## 2023-07-12 DIAGNOSIS — Z87898 Personal history of other specified conditions: Secondary | ICD-10-CM

## 2023-07-12 DIAGNOSIS — Z79891 Long term (current) use of opiate analgesic: Secondary | ICD-10-CM | POA: Diagnosis present

## 2023-07-12 DIAGNOSIS — Z9049 Acquired absence of other specified parts of digestive tract: Secondary | ICD-10-CM

## 2023-07-12 DIAGNOSIS — I160 Hypertensive urgency: Secondary | ICD-10-CM | POA: Diagnosis present

## 2023-07-12 DIAGNOSIS — Z9079 Acquired absence of other genital organ(s): Secondary | ICD-10-CM

## 2023-07-12 DIAGNOSIS — G629 Polyneuropathy, unspecified: Secondary | ICD-10-CM | POA: Diagnosis present

## 2023-07-12 DIAGNOSIS — Z886 Allergy status to analgesic agent status: Secondary | ICD-10-CM

## 2023-07-12 DIAGNOSIS — G894 Chronic pain syndrome: Secondary | ICD-10-CM | POA: Diagnosis present

## 2023-07-12 DIAGNOSIS — R7989 Other specified abnormal findings of blood chemistry: Secondary | ICD-10-CM | POA: Diagnosis present

## 2023-07-12 LAB — PROTIME-INR
INR: 1.1 (ref 0.8–1.2)
Prothrombin Time: 14 s (ref 11.4–15.2)

## 2023-07-12 LAB — CBC
HCT: 44.6 % (ref 36.0–46.0)
Hemoglobin: 14.9 g/dL (ref 12.0–15.0)
MCH: 30.7 pg (ref 26.0–34.0)
MCHC: 33.4 g/dL (ref 30.0–36.0)
MCV: 92 fL (ref 80.0–100.0)
Platelets: 248 10*3/uL (ref 150–400)
RBC: 4.85 MIL/uL (ref 3.87–5.11)
RDW: 13.6 % (ref 11.5–15.5)
WBC: 6 10*3/uL (ref 4.0–10.5)
nRBC: 0 % (ref 0.0–0.2)

## 2023-07-12 LAB — BASIC METABOLIC PANEL
Anion gap: 13 (ref 5–15)
BUN: 8 mg/dL (ref 6–20)
CO2: 26 mmol/L (ref 22–32)
Calcium: 9.1 mg/dL (ref 8.9–10.3)
Chloride: 98 mmol/L (ref 98–111)
Creatinine, Ser: 0.83 mg/dL (ref 0.44–1.00)
GFR, Estimated: >60 mL/min (ref >60)
Glucose, Bld: 97 mg/dL (ref 70–99)
Potassium: 3.1 mmol/L — ABNORMAL LOW (ref 3.5–5.1)
Sodium: 137 mmol/L (ref 135–145)

## 2023-07-12 LAB — TROPONIN I (HIGH SENSITIVITY): Troponin I (High Sensitivity): 117 ng/L (ref <18)

## 2023-07-12 LAB — LIPASE, BLOOD: Lipase: 21 U/L (ref 11–51)

## 2023-07-12 MED ORDER — METOCLOPRAMIDE HCL 5 MG/ML IJ SOLN
10.0000 mg | Freq: Once | INTRAMUSCULAR | Status: AC
  Administered 2023-07-13: 10 mg via INTRAVENOUS
  Filled 2023-07-12: qty 2

## 2023-07-12 MED ORDER — LACTATED RINGERS IV BOLUS
1000.0000 mL | Freq: Once | INTRAVENOUS | Status: AC
  Administered 2023-07-13: 1000 mL via INTRAVENOUS

## 2023-07-12 MED ORDER — HYDROMORPHONE HCL 1 MG/ML IJ SOLN
2.0000 mg | Freq: Once | INTRAMUSCULAR | Status: AC
  Administered 2023-07-13: 2 mg via INTRAVENOUS
  Filled 2023-07-12: qty 2

## 2023-07-12 MED ORDER — FAMOTIDINE IN NACL 20-0.9 MG/50ML-% IV SOLN
20.0000 mg | Freq: Once | INTRAVENOUS | Status: AC
  Administered 2023-07-13: 20 mg via INTRAVENOUS
  Filled 2023-07-12: qty 50

## 2023-07-12 NOTE — ED Triage Notes (Addendum)
Pt ambulatory to triage.  Pt has upper abd pain and chest pain.  Pt reports pancreatitis .  Pt has n/v/d  sx began yesterday.  Pt also reports sob.  Pt alert  speech clear  pt has not taken bp med tonight yet.

## 2023-07-12 NOTE — ED Provider Notes (Signed)
[facility] Provider Note    Event Date/Time   First MD Initiated Contact with Patient 07/12/23 2307     (approximate)   History   Abdominal Pain and Chest Pain   HPI  Sarah Sosa is a 42 y.o. female who presents to the ED for evaluation of Abdominal Pain and Chest Pain   I reviewed medical DC summary from September.  History of asthma, HTN, alcohol use disorder, chronic pain syndrome on methadone and oxycodone, dCHF.  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   Patient presents to the ED due to acute on chronic epigastric pain.  She reports chronic discomfort, but has been more severe intensity over the past 24 hours or so.  Causing emesis and unable to keep her medications down.  Now radiating up into her chest.  No syncope.  Physical Exam   Triage Vital Signs: ED Triage Vitals  Encounter Vitals Group     BP 07/12/23 1941 (!) 198/122     Systolic BP Percentile --      Diastolic BP Percentile --      Pulse Rate 07/12/23 1941 89     Resp 07/12/23 1941 20     Temp 07/12/23 1941 98 F (36.7 C)     Temp Source 07/12/23 1941 Oral     SpO2 07/12/23 1941 96 %     Weight 07/12/23 1934 272 lb (123.4 kg)     Height 07/12/23 1934 5\' 7"  (1.702 m)     Head Circumference --      Peak Flow --      Pain Score 07/12/23 1934 9     Pain Loc --      Pain Education --      Exclude from Growth Chart --     Most recent vital signs: Vitals:   07/13/23 0015 07/13/23 0030  BP: (!) 162/98 (!) 148/101  Pulse: (!) 59 (!) 58  Resp: 11 13  Temp:    SpO2: 93% 95%    General: Awake, no distress.  CV:  Good peripheral perfusion.  Resp:  Normal effort.  Abd:  No distention.  Epigastric tenderness, benign lower abdomen MSK:  No deformity noted.  Neuro:  No focal deficits appreciated. Other:     ED Results / Procedures / Treatments   Labs (all labs ordered are listed, but only abnormal results are displayed) Labs Reviewed  BASIC METABOLIC PANEL - Abnormal; Notable for  the following components:      Result Value   Potassium 3.1 (*)    All other components within normal limits  MAGNESIUM - Abnormal; Notable for the following components:   Magnesium 1.3 (*)    All other components within normal limits  TROPONIN I (HIGH SENSITIVITY) - Abnormal; Notable for the following components:   Troponin I (High Sensitivity) 117 (*)    All other components within normal limits  TROPONIN I (HIGH SENSITIVITY) - Abnormal; Notable for the following components:   Troponin I (High Sensitivity) 114 (*)    All other components within normal limits  CBC  PROTIME-INR  LIPASE, BLOOD  URINALYSIS, ROUTINE W REFLEX MICROSCOPIC    EKG Sinus rhythm with a rate of 69 bpm.  Normal axis and intervals.  No clear signs of acute ischemia.  RADIOLOGY CXR interpreted by me without evidence of acute cardiopulmonary pathology.  Official radiology report(s): CT Angio Chest PE W and/or Wo Contrast Result Date: 07/13/2023 CLINICAL DATA:  chest pain, elevated trops. eval PE. hx AVR; epigastric  pain, emesis. hx chronic pancreatitis EXAM: CT ANGIOGRAPHY CHEST CT ABDOMEN AND PELVIS WITH CONTRAST TECHNIQUE: Multidetector CT imaging of the chest was performed using the standard protocol during bolus administration of intravenous contrast. Multiplanar CT image reconstructions and MIPs were obtained to evaluate the vascular anatomy. Multidetector CT imaging of the abdomen and pelvis was performed using the standard protocol during bolus administration of intravenous contrast. RADIATION DOSE REDUCTION: This exam was performed according to the departmental dose-optimization program which includes automated exposure control, adjustment of the mA and/or kV according to patient size and/or use of iterative reconstruction technique. CONTRAST:  OMNIPAQUE IOHEXOL 350 MG/ML SOLN COMPARISON:  CT abdomen pelvis 03/11/2023 CT angio chest abdomen pelvis 08/24/2022 FINDINGS: CTA CHEST FINDINGS Cardiovascular:  Satisfactory opacification of the pulmonary arteries to the segmental level. No evidence of pulmonary embolism. The main pulmonary artery is normal in caliber. Normal heart size. No significant pericardial effusion. The thoracic aorta is normal in caliber. No atherosclerotic plaque of the thoracic aorta. No coronary artery calcifications. Aortic valve replacement. Mediastinum/Nodes: Interval resolution of anterior mediastinal soft tissue density. Interval development of borderline enlarged mediastinal and inter lobar lymph nodes measuring up to 1 cm (2:36 737). No enlarged hilar or axillary lymph nodes. Thyroid gland, trachea, and esophagus demonstrate no significant findings. Lungs/Pleura: Developing bilateral lower lobe and lingular patchy peribronchovascular ground-glass airspace opacities. No pulmonary nodule. No pulmonary mass. No pleural effusion. No pneumothorax. Musculoskeletal: No chest wall abnormality. No suspicious lytic or blastic osseous lesions. No acute displaced fracture. Multilevel degenerative changes of the spine. Intact sternotomy wires. Review of the MIP images confirms the above findings. CT ABDOMEN and PELVIS FINDINGS Hepatobiliary: No focal liver abnormality. Status post cholecystectomy. No biliary dilatation. Pancreas: No focal lesion. Normal pancreatic contour. No surrounding inflammatory changes. No main pancreatic ductal dilatation. Spleen: Normal in size without focal abnormality. Adrenals/Urinary Tract: No adrenal nodule bilaterally. Bilateral kidneys enhance symmetrically. No hydronephrosis. No hydroureter. The urinary bladder is unremarkable. Stomach/Bowel: Stomach is within normal limits. No evidence of bowel wall thickening or dilatation. Nonspecific persistent small bowel mesentery swirling (4:61). Appendix appears normal. Vascular/Lymphatic: No abdominal aorta or iliac aneurysm. Mild atherosclerotic plaque of the aorta and its branches. No abdominal, pelvic, or inguinal  lymphadenopathy. Reproductive: Left corpus luteum cyst-no further follow-up indicated. Interval development of a 3.7 x 3.1 cm right ovarian cystic lesion-no further follow-up indicated. Status post hysterectomy. Other: Nonspecific trace pelvic intraperitoneal simple free fluid. No intraperitoneal free gas. No organized fluid collection. Musculoskeletal: Tiny fat containing supraumbilical ventral hernia (4:18). No suspicious lytic or blastic osseous lesions. No acute displaced fracture. Multilevel degenerative changes of the spine. Review of the MIP images confirms the above findings. IMPRESSION: 1. No pulmonary embolus. 2. Developing bilateral lower lobe and lingular patchy peribronchovascular ground-glass airspace opacities. Findings could represent developing infection/inflammation. 3. Interval development of nonspecific borderline enlarged mediastinal and interlobar lymph nodes likely reactive in etiology. Recommend attention on follow. 4. No acute intra-abdominal or intrapelvic abnormality. 5. Status post hysterectomy. 6.  Aortic Atherosclerosis (ICD10-I70.0). Electronically Signed   By: Tish Frederickson M.D.   On: 07/13/2023 01:40   CT ABDOMEN PELVIS W CONTRAST Result Date: 07/13/2023 CLINICAL DATA:  chest pain, elevated trops. eval PE. hx AVR; epigastric pain, emesis. hx chronic pancreatitis EXAM: CT ANGIOGRAPHY CHEST CT ABDOMEN AND PELVIS WITH CONTRAST TECHNIQUE: Multidetector CT imaging of the chest was performed using the standard protocol during bolus administration of intravenous contrast. Multiplanar CT image reconstructions and MIPs were obtained to evaluate the vascular anatomy.  Multidetector CT imaging of the abdomen and pelvis was performed using the standard protocol during bolus administration of intravenous contrast. RADIATION DOSE REDUCTION: This exam was performed according to the departmental dose-optimization program which includes automated exposure control, adjustment of the mA and/or kV  according to patient size and/or use of iterative reconstruction technique. CONTRAST:  OMNIPAQUE IOHEXOL 350 MG/ML SOLN COMPARISON:  CT abdomen pelvis 03/11/2023 CT angio chest abdomen pelvis 08/24/2022 FINDINGS: CTA CHEST FINDINGS Cardiovascular: Satisfactory opacification of the pulmonary arteries to the segmental level. No evidence of pulmonary embolism. The main pulmonary artery is normal in caliber. Normal heart size. No significant pericardial effusion. The thoracic aorta is normal in caliber. No atherosclerotic plaque of the thoracic aorta. No coronary artery calcifications. Aortic valve replacement. Mediastinum/Nodes: Interval resolution of anterior mediastinal soft tissue density. Interval development of borderline enlarged mediastinal and inter lobar lymph nodes measuring up to 1 cm (2:36 737). No enlarged hilar or axillary lymph nodes. Thyroid gland, trachea, and esophagus demonstrate no significant findings. Lungs/Pleura: Developing bilateral lower lobe and lingular patchy peribronchovascular ground-glass airspace opacities. No pulmonary nodule. No pulmonary mass. No pleural effusion. No pneumothorax. Musculoskeletal: No chest wall abnormality. No suspicious lytic or blastic osseous lesions. No acute displaced fracture. Multilevel degenerative changes of the spine. Intact sternotomy wires. Review of the MIP images confirms the above findings. CT ABDOMEN and PELVIS FINDINGS Hepatobiliary: No focal liver abnormality. Status post cholecystectomy. No biliary dilatation. Pancreas: No focal lesion. Normal pancreatic contour. No surrounding inflammatory changes. No main pancreatic ductal dilatation. Spleen: Normal in size without focal abnormality. Adrenals/Urinary Tract: No adrenal nodule bilaterally. Bilateral kidneys enhance symmetrically. No hydronephrosis. No hydroureter. The urinary bladder is unremarkable. Stomach/Bowel: Stomach is within normal limits. No evidence of bowel wall thickening or  dilatation. Nonspecific persistent small bowel mesentery swirling (4:61). Appendix appears normal. Vascular/Lymphatic: No abdominal aorta or iliac aneurysm. Mild atherosclerotic plaque of the aorta and its branches. No abdominal, pelvic, or inguinal lymphadenopathy. Reproductive: Left corpus luteum cyst-no further follow-up indicated. Interval development of a 3.7 x 3.1 cm right ovarian cystic lesion-no further follow-up indicated. Status post hysterectomy. Other: Nonspecific trace pelvic intraperitoneal simple free fluid. No intraperitoneal free gas. No organized fluid collection. Musculoskeletal: Tiny fat containing supraumbilical ventral hernia (4:18). No suspicious lytic or blastic osseous lesions. No acute displaced fracture. Multilevel degenerative changes of the spine. Review of the MIP images confirms the above findings. IMPRESSION: 1. No pulmonary embolus. 2. Developing bilateral lower lobe and lingular patchy peribronchovascular ground-glass airspace opacities. Findings could represent developing infection/inflammation. 3. Interval development of nonspecific borderline enlarged mediastinal and interlobar lymph nodes likely reactive in etiology. Recommend attention on follow. 4. No acute intra-abdominal or intrapelvic abnormality. 5. Status post hysterectomy. 6.  Aortic Atherosclerosis (ICD10-I70.0). Electronically Signed   By: Tish Frederickson M.D.   On: 07/13/2023 01:40   DG Chest 2 View Result Date: 07/12/2023 CLINICAL DATA:  Chest pain EXAM: CHEST - 2 VIEW COMPARISON:  03/20/2023 FINDINGS: Prior median sternotomy and valve replacement. Heart is borderline in size. Lungs clear. No effusions or edema. No acute bony abnormality IMPRESSION: No active cardiopulmonary disease. Electronically Signed   By: Charlett Nose M.D.   On: 07/12/2023 20:10    PROCEDURES and INTERVENTIONS:  .1-3 Lead EKG Interpretation  Performed by: Delton Prairie, MD Authorized by: Delton Prairie, MD     Interpretation: normal      ECG rate:  62   ECG rate assessment: normal     Rhythm: sinus rhythm  Ectopy: none     Conduction: normal   .Critical Care  Performed by: Delton Prairie, MD Authorized by: Delton Prairie, MD   Critical care provider statement:    Critical care time (minutes):  30   Critical care time was exclusive of:  Separately billable procedures and treating other patients   Critical care was necessary to treat or prevent imminent or life-threatening deterioration of the following conditions:  Cardiac failure   Critical care was time spent personally by me on the following activities:  Development of treatment plan with patient or surrogate, discussions with consultants, evaluation of patient's response to treatment, examination of patient, ordering and review of laboratory studies, ordering and review of radiographic studies, ordering and performing treatments and interventions, pulse oximetry, re-evaluation of patient's condition and review of old charts   Medications  aspirin chewable tablet 324 mg (has no administration in time range)  heparin injection 4,000 Units (has no administration in time range)  cefTRIAXone (ROCEPHIN) 2 g in sodium chloride 0.9 % 100 mL IVPB (has no administration in time range)  azithromycin (ZITHROMAX) 500 mg in sodium chloride 0.9 % 250 mL IVPB (has no administration in time range)  HYDROmorphone (DILAUDID) injection 2 mg (2 mg Intravenous Given 07/13/23 0030)  lactated ringers bolus 1,000 mL (1,000 mLs Intravenous New Bag/Given 07/13/23 0032)  metoCLOPramide (REGLAN) injection 10 mg (10 mg Intravenous Given 07/13/23 0032)  famotidine (PEPCID) IVPB 20 mg premix (20 mg Intravenous New Bag/Given 07/13/23 0033)  iohexol (OMNIPAQUE) 350 MG/ML injection 100 mL (100 mLs Intravenous Contrast Given 07/13/23 0053)     IMPRESSION / MDM / ASSESSMENT AND PLAN / ED COURSE  I reviewed the triage vital signs and the nursing notes.  Differential diagnosis includes, but is not  limited to, ACS, PTX, PNA, muscle strain/spasm, PE, dissection, anxiety, pleural effusion, opiate withdrawals  {Patient presents with symptoms of an acute illness or injury that is potentially life-threatening.  Patient history of chronic pain syndrome and mechanical MVR presents with acute on chronic pain.  She is a nonischemic EKG but quite elevated first troponin.  We will trend this with a second.   Normal CBC.  Hypokalemia and hypomagnesemia.  Normal lipase.  CT with signs of pneumonia, no PE and no intra-abdominal pathology.  We will start her on antibiotics for her pneumonia.  Due to her elevated troponins we will provide aspirin, heparinize and consult with medicine for admission.  Clinical Course as of 07/13/23 0200  Fri Jul 13, 2023  0050 Reassessed.  Feeling better. [DS]  0200 Reassessed, still feeling little bit better.  We discussed CT results, signs of pneumonia and persistently elevated troponins.  I recommended medical admission and she is agreeable. [DS]    Clinical Course User Index [DS] Delton Prairie, MD     FINAL CLINICAL IMPRESSION(S) / ED DIAGNOSES   Final diagnoses:  Subtherapeutic international normalized ratio (INR)  Elevated troponin  Community acquired pneumonia of left lower lobe of lung     Rx / DC Orders   ED Discharge Orders     None        Note:  This document was prepared using Dragon voice recognition software and may include unintentional dictation errors.   Delton Prairie, MD 07/13/23 [phone]

## 2023-07-13 ENCOUNTER — Emergency Department: Payer: Medicaid Other

## 2023-07-13 DIAGNOSIS — Z952 Presence of prosthetic heart valve: Secondary | ICD-10-CM

## 2023-07-13 DIAGNOSIS — K219 Gastro-esophageal reflux disease without esophagitis: Secondary | ICD-10-CM | POA: Diagnosis present

## 2023-07-13 DIAGNOSIS — I5032 Chronic diastolic (congestive) heart failure: Secondary | ICD-10-CM | POA: Diagnosis present

## 2023-07-13 DIAGNOSIS — K852 Alcohol induced acute pancreatitis without necrosis or infection: Secondary | ICD-10-CM | POA: Diagnosis present

## 2023-07-13 DIAGNOSIS — G894 Chronic pain syndrome: Secondary | ICD-10-CM | POA: Diagnosis present

## 2023-07-13 DIAGNOSIS — R112 Nausea with vomiting, unspecified: Secondary | ICD-10-CM | POA: Diagnosis not present

## 2023-07-13 DIAGNOSIS — R7989 Other specified abnormal findings of blood chemistry: Secondary | ICD-10-CM | POA: Diagnosis present

## 2023-07-13 DIAGNOSIS — I5030 Unspecified diastolic (congestive) heart failure: Secondary | ICD-10-CM | POA: Diagnosis not present

## 2023-07-13 DIAGNOSIS — Z6841 Body Mass Index (BMI) 40.0 and over, adult: Secondary | ICD-10-CM | POA: Diagnosis not present

## 2023-07-13 DIAGNOSIS — Z7901 Long term (current) use of anticoagulants: Secondary | ICD-10-CM | POA: Diagnosis not present

## 2023-07-13 DIAGNOSIS — I21A1 Myocardial infarction type 2: Secondary | ICD-10-CM | POA: Diagnosis present

## 2023-07-13 DIAGNOSIS — J189 Pneumonia, unspecified organism: Secondary | ICD-10-CM

## 2023-07-13 DIAGNOSIS — I7121 Aneurysm of the ascending aorta, without rupture: Secondary | ICD-10-CM | POA: Diagnosis present

## 2023-07-13 DIAGNOSIS — G629 Polyneuropathy, unspecified: Secondary | ICD-10-CM | POA: Diagnosis present

## 2023-07-13 DIAGNOSIS — K861 Other chronic pancreatitis: Secondary | ICD-10-CM

## 2023-07-13 DIAGNOSIS — F17210 Nicotine dependence, cigarettes, uncomplicated: Secondary | ICD-10-CM | POA: Diagnosis present

## 2023-07-13 DIAGNOSIS — Z7984 Long term (current) use of oral hypoglycemic drugs: Secondary | ICD-10-CM | POA: Diagnosis not present

## 2023-07-13 DIAGNOSIS — I110 Hypertensive heart disease with heart failure: Secondary | ICD-10-CM | POA: Diagnosis present

## 2023-07-13 DIAGNOSIS — R791 Abnormal coagulation profile: Secondary | ICD-10-CM | POA: Diagnosis present

## 2023-07-13 DIAGNOSIS — E785 Hyperlipidemia, unspecified: Secondary | ICD-10-CM | POA: Diagnosis present

## 2023-07-13 DIAGNOSIS — E876 Hypokalemia: Secondary | ICD-10-CM | POA: Diagnosis present

## 2023-07-13 DIAGNOSIS — F1010 Alcohol abuse, uncomplicated: Secondary | ICD-10-CM | POA: Diagnosis present

## 2023-07-13 DIAGNOSIS — K8681 Exocrine pancreatic insufficiency: Secondary | ICD-10-CM | POA: Diagnosis present

## 2023-07-13 DIAGNOSIS — I214 Non-ST elevation (NSTEMI) myocardial infarction: Secondary | ICD-10-CM | POA: Diagnosis not present

## 2023-07-13 DIAGNOSIS — K860 Alcohol-induced chronic pancreatitis: Secondary | ICD-10-CM | POA: Diagnosis present

## 2023-07-13 DIAGNOSIS — I160 Hypertensive urgency: Secondary | ICD-10-CM | POA: Diagnosis present

## 2023-07-13 DIAGNOSIS — E66813 Obesity, class 3: Secondary | ICD-10-CM | POA: Diagnosis present

## 2023-07-13 LAB — HEPARIN LEVEL (UNFRACTIONATED): Heparin Unfractionated: 0.12 [IU]/mL — ABNORMAL LOW (ref 0.30–0.70)

## 2023-07-13 LAB — APTT: aPTT: 119 s — ABNORMAL HIGH (ref 24–36)

## 2023-07-13 LAB — TROPONIN I (HIGH SENSITIVITY): Troponin I (High Sensitivity): 114 ng/L (ref <18)

## 2023-07-13 LAB — PROTIME-INR
INR: 1.3 — ABNORMAL HIGH (ref 0.8–1.2)
Prothrombin Time: 16 s — ABNORMAL HIGH (ref 11.4–15.2)

## 2023-07-13 LAB — MAGNESIUM: Magnesium: 1.3 mg/dL — ABNORMAL LOW (ref 1.7–2.4)

## 2023-07-13 LAB — HIV ANTIBODY (ROUTINE TESTING W REFLEX): HIV Screen 4th Generation wRfx: NONREACTIVE

## 2023-07-13 MED ORDER — WARFARIN SODIUM 5 MG PO TABS
5.0000 mg | ORAL_TABLET | Freq: Once | ORAL | Status: AC
  Administered 2023-07-13: 5 mg via ORAL
  Filled 2023-07-13: qty 1

## 2023-07-13 MED ORDER — ASPIRIN 81 MG PO CHEW
324.0000 mg | CHEWABLE_TABLET | Freq: Once | ORAL | Status: AC
  Administered 2023-07-13: 324 mg via ORAL
  Filled 2023-07-13: qty 4

## 2023-07-13 MED ORDER — SPIRONOLACTONE 25 MG PO TABS
25.0000 mg | ORAL_TABLET | Freq: Every day | ORAL | Status: DC
  Administered 2023-07-13 – 2023-07-18 (×6): 25 mg via ORAL
  Filled 2023-07-13 (×6): qty 1

## 2023-07-13 MED ORDER — SODIUM CHLORIDE 0.9 % IV SOLN
500.0000 mg | INTRAVENOUS | Status: DC
  Administered 2023-07-13 – 2023-07-16 (×3): 500 mg via INTRAVENOUS
  Filled 2023-07-13 (×3): qty 5

## 2023-07-13 MED ORDER — METHADONE HCL 10 MG PO TABS
10.0000 mg | ORAL_TABLET | Freq: Four times a day (QID) | ORAL | Status: DC
  Administered 2023-07-13 – 2023-07-18 (×20): 10 mg via ORAL
  Filled 2023-07-13 (×21): qty 1

## 2023-07-13 MED ORDER — PANTOPRAZOLE SODIUM 40 MG IV SOLR
40.0000 mg | Freq: Every day | INTRAVENOUS | Status: DC
  Administered 2023-07-13 – 2023-07-18 (×6): 40 mg via INTRAVENOUS
  Filled 2023-07-13 (×6): qty 10

## 2023-07-13 MED ORDER — NITROGLYCERIN 0.4 MG SL SUBL: 0.4000 mg | SUBLINGUAL_TABLET | SUBLINGUAL | Status: DC | PRN

## 2023-07-13 MED ORDER — HEPARIN BOLUS VIA INFUSION
2700.0000 [IU] | Freq: Once | INTRAVENOUS | Status: DC
  Filled 2023-07-13: qty 2700

## 2023-07-13 MED ORDER — ASPIRIN 81 MG PO TBEC
81.0000 mg | DELAYED_RELEASE_TABLET | Freq: Every day | ORAL | Status: DC
  Administered 2023-07-14 – 2023-07-18 (×5): 81 mg via ORAL
  Filled 2023-07-13 (×5): qty 1

## 2023-07-13 MED ORDER — OXYCODONE HCL 5 MG PO TABS
5.0000 mg | ORAL_TABLET | Freq: Four times a day (QID) | ORAL | Status: DC | PRN
  Administered 2023-07-13 – 2023-07-18 (×10): 5 mg via ORAL
  Filled 2023-07-13 (×11): qty 1

## 2023-07-13 MED ORDER — SODIUM CHLORIDE 0.9 % IV SOLN: 12.5000 mg | Freq: Four times a day (QID) | INTRAVENOUS | Status: DC | PRN

## 2023-07-13 MED ORDER — HEPARIN (PORCINE) 25000 UT/250ML-% IV SOLN
1500.0000 [IU]/h | INTRAVENOUS | Status: DC
  Administered 2023-07-13: 1200 [IU]/h via INTRAVENOUS
  Filled 2023-07-13: qty 250

## 2023-07-13 MED ORDER — IOHEXOL 350 MG/ML SOLN
100.0000 mL | Freq: Once | INTRAVENOUS | Status: AC | PRN
  Administered 2023-07-13: 100 mL via INTRAVENOUS

## 2023-07-13 MED ORDER — ACETAMINOPHEN 325 MG PO TABS: 650.0000 mg | ORAL_TABLET | ORAL | Status: DC | PRN

## 2023-07-13 MED ORDER — WARFARIN - PHARMACIST DOSING INPATIENT
Freq: Every day | Status: DC
  Filled 2023-07-13: qty 1

## 2023-07-13 MED ORDER — HEPARIN BOLUS VIA INFUSION
4000.0000 [IU] | Freq: Once | INTRAVENOUS | Status: AC
Start: 2023-07-13 — End: 2023-07-13
  Administered 2023-07-13: 4000 [IU] via INTRAVENOUS
  Filled 2023-07-13: qty 4000

## 2023-07-13 MED ORDER — SODIUM CHLORIDE 0.9 % IV SOLN
2.0000 g | Freq: Once | INTRAVENOUS | Status: AC
  Administered 2023-07-13: 2 g via INTRAVENOUS
  Filled 2023-07-13: qty 20

## 2023-07-13 MED ORDER — HEPARIN SODIUM (PORCINE) 5000 UNIT/ML IJ SOLN: 4000.0000 [IU] | Freq: Once | INTRAMUSCULAR | Status: DC

## 2023-07-13 MED ORDER — ONDANSETRON HCL 4 MG/2ML IJ SOLN
4.0000 mg | Freq: Four times a day (QID) | INTRAMUSCULAR | Status: DC | PRN
  Administered 2023-07-14: 4 mg via INTRAVENOUS
  Filled 2023-07-13 (×2): qty 2

## 2023-07-13 MED ORDER — OXYCODONE HCL 5 MG PO TABS
5.0000 mg | ORAL_TABLET | Freq: Four times a day (QID) | ORAL | Status: DC | PRN
  Administered 2023-07-13: 5 mg via ORAL
  Filled 2023-07-13: qty 1

## 2023-07-13 MED ORDER — HYDROMORPHONE HCL 1 MG/ML IJ SOLN
1.0000 mg | INTRAMUSCULAR | Status: AC | PRN
  Administered 2023-07-13 (×2): 1 mg via INTRAVENOUS
  Filled 2023-07-13 (×2): qty 1

## 2023-07-13 MED ORDER — HYDROMORPHONE HCL 1 MG/ML IJ SOLN
1.0000 mg | INTRAMUSCULAR | Status: DC | PRN
  Administered 2023-07-13 – 2023-07-15 (×12): 1 mg via INTRAVENOUS
  Filled 2023-07-13 (×12): qty 1

## 2023-07-13 MED ORDER — SODIUM CHLORIDE 0.9 % IV SOLN
500.0000 mg | Freq: Once | INTRAVENOUS | Status: AC
  Administered 2023-07-13: 500 mg via INTRAVENOUS
  Filled 2023-07-13: qty 5

## 2023-07-13 MED ORDER — BUPRENORPHINE HCL-NALOXONE HCL 8-2 MG SL SUBL
1.0000 | SUBLINGUAL_TABLET | Freq: Four times a day (QID) | SUBLINGUAL | Status: DC | PRN
  Administered 2023-07-17: 1 via SUBLINGUAL
  Filled 2023-07-13: qty 1

## 2023-07-13 MED ORDER — SODIUM CHLORIDE 0.9 % IV SOLN
2.0000 g | INTRAVENOUS | Status: DC
  Administered 2023-07-13 – 2023-07-15 (×3): 2 g via INTRAVENOUS
  Filled 2023-07-13 (×3): qty 20

## 2023-07-13 MED ORDER — ATORVASTATIN CALCIUM 20 MG PO TABS
40.0000 mg | ORAL_TABLET | Freq: Every day | ORAL | Status: DC
  Administered 2023-07-13 – 2023-07-18 (×6): 40 mg via ORAL
  Filled 2023-07-13 (×6): qty 2

## 2023-07-13 MED ORDER — FUROSEMIDE 40 MG PO TABS: 40.0000 mg | ORAL_TABLET | Freq: Every day | ORAL | Status: DC | PRN

## 2023-07-13 NOTE — Assessment & Plan Note (Signed)
Complicating factor to overall prognosis and care 

## 2023-07-13 NOTE — Progress Notes (Signed)
Courtesy note:  Patient is seen and examined today morning.  States that she has chest discomfort more with deep inspiration, moving.  Does have chest wall tenderness.  Started on heparin drip per weight-based protocol as troponin elevated.  She is admitted for community-acquired pneumonia, elevated troponin, intractable nausea and vomiting, electrolyte abnormalities.  Cardiologist evaluated the patient recommended to stop heparin drip, elevated troponin due to demand ischemia from infection.  Continue antibiotics for pneumonia, Coumadin for history of mechanical aortic valve.  Continue to supplement electrolytes as needed.  Continue close monitoring of blood pressures, pain control.  Further management as per clinical course.

## 2023-07-13 NOTE — Assessment & Plan Note (Signed)
Clinically euvolemic Continue spironolactone and Lasix

## 2023-07-13 NOTE — Assessment & Plan Note (Signed)
Chronic alcoholic pancreatitis Patient with pain typical for her to pancreatitis but unable to tolerate oral meds Lipase normal Pain control, antiemetics

## 2023-07-13 NOTE — Consult Note (Cosign Needed)
[facility] CLINIC CARDIOLOGY CONSULT NOTE       Patient IDThomasenia Sosa MRN: [id number] DOB/AGE: 41/16/1982 41 y.o.  Admit date: 07/12/2023 Referring Physician Dr. [facility]  Primary Cardiologist Dr. Launa [facility]) Reason for Consultation elevated troponin  HPI: Sarah Sosa is a 42 y.o. female  with a past medical history of severe aortic stenosis s/p mechanical AVR 04/10/2022, ascending aortic aneurysm s/p repair 03/2022, chronic diastolic heart failure, hypertension, hyperlipidemia, alcohol induced chronic pancreatitis, chronic pain who presented to the ED on 07/12/2023 for abdominal pain and vomiting.  Troponin checked and found to be mildly elevated and flat.  Cardiology was consulted for further evaluation.   Patient states that a few days ago she began having abdominal pain.  States similar to her prior episodes of pancreatitis.  Reports that yesterday she began having vomiting.  Reports feeling feverish at times.  Given her symptoms she decided to come to the ED for further evaluation.  Noted to be hypertensive in the ED.  Workup in the ED notable for creatinine 0.83, potassium 3.1, magnesium 1.3, hemoglobin 14.9, WBC 6.0.  Troponins trended 117 > 114.  EKG demonstrated sinus rhythm, nonacute.  CTA chest without evidence of PE, did note some groundglass opacities.  At the time of my evaluation this morning patient reports that her pain initially had improved but is now back.  This is located in the middle of her abdomen to epigastric region.  She reports that she also has some shortness of breath, intermittent palpitations, and lower extremity edema.  States that these have been ongoing issues.  She denies any episodes of dizziness, lightheadedness, syncope.  She denies any chest pain.  Review of systems complete and found to be negative unless listed above    Past Medical History:  Diagnosis Date   Arthritis    Asthma     Chronic pain disorder 03/19/2018   GERD (gastroesophageal reflux disease)    Headache    Heart murmur    History of trichomoniasis 03/19/2018   Hypertension    Intractable nausea and vomiting    Pancreatitis    PID (pelvic inflammatory disease) 02/14/2018   Uterine leiomyoma 03/19/2018    Past Surgical History:  Procedure Laterality Date   ABDOMINAL HYSTERECTOMY     AORTIC VALVE REPLACEMENT  03/2022   CHOLECYSTECTOMY     DENTAL SURGERY     HYSTERECTOMY ABDOMINAL WITH SALPINGECTOMY Bilateral 04/15/2018   Procedure: HYSTERECTOMY ABDOMINAL WITH BILATERAL SALPINGECTOMY;  Surgeon: Hildred Laser, MD;  Location: [facility] ORS;  Service: Gynecology;  Laterality: Bilateral;   kenn surgery Bilateral    KNEE ARTHROSCOPY Left 2012, 2013    (Not in a hospital admission)  Social History   Socioeconomic History   Marital status: Single    Spouse name: Not on file   Number of children: Not on file   Years of education: Not on file   Highest education level: Not on file  Occupational History   Not on file  Tobacco Use   Smoking status: Some Days    Current packs/day: 0.50    Average packs/day: 0.5 packs/day for 22.0 years (11.0 ttl pk-yrs)    Types: Cigarettes   Smokeless tobacco: Never  Vaping Use   Vaping status: Never Used  Substance and Sexual Activity   Alcohol use: Not Currently    Comment: occasional   Drug use: Yes    Types: Marijuana   Sexual activity: Not on file  Other Topics  Concern   Not on file  Social History Narrative   Not on file   Social Drivers of Health   Financial Resource Strain: Medium Risk (08/10/2021)   Received from [facility], [facility]   Overall Financial Resource Strain (CARDIA)    Difficulty of Paying Living Expenses: Somewhat hard  Food Insecurity: Patient Declined (03/20/2023)   Hunger Vital Sign    Worried About Running Out of Food in the Last Year: Patient declined    Ran Out of Food in the Last Year:  Patient declined  Transportation Needs: Patient Declined (03/20/2023)   PRAPARE - Administrator, Civil Service (Medical): Patient declined    Lack of Transportation (Non-Medical): Patient declined  Physical Activity: Not on file  Stress: Stress Concern Present (06/05/2021)   Received from [facility], [facility]   [employer] of Occupational Health - Occupational Stress Questionnaire    Feeling of Stress : Rather much  Social Connections: Moderately Isolated (06/05/2021)   Received from [facility], [facility]   Social Connection and Isolation Panel [NHANES]    Frequency of Communication with Friends and Family: More than three times a week    Frequency of Social Gatherings with Friends and Family: Twice a week    Attends Religious Services: Never    Database administrator or Organizations: No    Attends Engineer, structural: Patient declined    Marital Status: Living with partner  Intimate Partner Violence: Patient Declined (03/20/2023)   Humiliation, Afraid, Rape, and Kick questionnaire    Fear of Current or Ex-Partner: Patient declined    Emotionally Abused: Patient declined    Physically Abused: Patient declined    Sexually Abused: Patient declined    Family History  Problem Relation Age of Onset   Fibroids Mother    Hypertension Mother    Arthritis Mother      Vitals:   07/13/23 0600 07/13/23 0700 07/13/23 0755 07/13/23 0758  BP: 116/60 127/89 127/89   Pulse: 71 62 70   Resp: (!) 21 (!) 21 20   Temp:    98 F (36.7 C)  TempSrc:    Oral  SpO2: 91% 90% 94%   Weight:      Height:        PHYSICAL EXAM General: Chronically ill-appearing female, well nourished, in no acute distress. HEENT: Normocephalic and atraumatic. Neck: No JVD.  Lungs: Normal respiratory effort on room air. Clear bilaterally to auscultation. No wheezes, crackles, rhonchi.  Heart: HRRR. Normal S1 and  S2.  Systolic murmur.  Abdomen: Non-distended appearing.  Msk: Normal strength and tone for age. Extremities: Warm and well perfused. No clubbing, cyanosis.  No edema.  Neuro: Alert and oriented X 3. Psych: Answers questions appropriately.   Labs: Basic Metabolic Panel: Recent Labs    07/12/23 2038 07/13/23 0028  NA 137  --   K 3.1*  --   CL 98  --   CO2 26  --   GLUCOSE 97  --   BUN 8  --   CREATININE 0.83  --   CALCIUM 9.1  --   MG  --  1.3*   Liver Function Tests: No results for input(s): "AST", "ALT", "ALKPHOS", "BILITOT", "PROT", "ALBUMIN" in the last 72 hours. Recent Labs    07/12/23 2038  LIPASE 21   CBC: Recent Labs    07/12/23 2038  WBC 6.0  HGB 14.9  HCT 44.6  MCV 92.0  PLT 248   Cardiac Enzymes: Recent Labs    07/12/23 2038 07/13/23 0028  TROPONINIHS 117* 114*   BNP: No results for input(s): "BNP" in the last 72 hours. D-Dimer: No results for input(s): "DDIMER" in the last 72 hours. Hemoglobin A1C: No results for input(s): "HGBA1C" in the last 72 hours. Fasting Lipid Panel: No results for input(s): "CHOL", "HDL", "LDLCALC", "TRIG", "CHOLHDL", "LDLDIRECT" in the last 72 hours. Thyroid Function Tests: No results for input(s): "TSH", "T4TOTAL", "T3FREE", "THYROIDAB" in the last 72 hours.  Invalid input(s): "FREET3" Anemia Panel: No results for input(s): "VITAMINB12", "FOLATE", "FERRITIN", "TIBC", "IRON", "RETICCTPCT" in the last 72 hours.   Radiology: CT Angio Chest PE W and/or Wo Contrast Result Date: 07/13/2023 CLINICAL DATA:  chest pain, elevated trops. eval PE. hx AVR; epigastric pain, emesis. hx chronic pancreatitis EXAM: CT ANGIOGRAPHY CHEST CT ABDOMEN AND PELVIS WITH CONTRAST TECHNIQUE: Multidetector CT imaging of the chest was performed using the standard protocol during bolus administration of intravenous contrast. Multiplanar CT image reconstructions and MIPs were obtained to evaluate the vascular anatomy. Multidetector CT imaging of  the abdomen and pelvis was performed using the standard protocol during bolus administration of intravenous contrast. RADIATION DOSE REDUCTION: This exam was performed according to the departmental dose-optimization program which includes automated exposure control, adjustment of the mA and/or kV according to patient size and/or use of iterative reconstruction technique. CONTRAST:  OMNIPAQUE IOHEXOL 350 MG/ML SOLN COMPARISON:  CT abdomen pelvis 03/11/2023 CT angio chest abdomen pelvis 08/24/2022 FINDINGS: CTA CHEST FINDINGS Cardiovascular: Satisfactory opacification of the pulmonary arteries to the segmental level. No evidence of pulmonary embolism. The main pulmonary artery is normal in caliber. Normal heart size. No significant pericardial effusion. The thoracic aorta is normal in caliber. No atherosclerotic plaque of the thoracic aorta. No coronary artery calcifications. Aortic valve replacement. Mediastinum/Nodes: Interval resolution of anterior mediastinal soft tissue density. Interval development of borderline enlarged mediastinal and inter lobar lymph nodes measuring up to 1 cm (2:36 737). No enlarged hilar or axillary lymph nodes. Thyroid gland, trachea, and esophagus demonstrate no significant findings. Lungs/Pleura: Developing bilateral lower lobe and lingular patchy peribronchovascular ground-glass airspace opacities. No pulmonary nodule. No pulmonary mass. No pleural effusion. No pneumothorax. Musculoskeletal: No chest wall abnormality. No suspicious lytic or blastic osseous lesions. No acute displaced fracture. Multilevel degenerative changes of the spine. Intact sternotomy wires. Review of the MIP images confirms the above findings. CT ABDOMEN and PELVIS FINDINGS Hepatobiliary: No focal liver abnormality. Status post cholecystectomy. No biliary dilatation. Pancreas: No focal lesion. Normal pancreatic contour. No surrounding inflammatory changes. No main pancreatic ductal dilatation. Spleen:  Normal in size without focal abnormality. Adrenals/Urinary Tract: No adrenal nodule bilaterally. Bilateral kidneys enhance symmetrically. No hydronephrosis. No hydroureter. The urinary bladder is unremarkable. Stomach/Bowel: Stomach is within normal limits. No evidence of bowel wall thickening or dilatation. Nonspecific persistent small bowel mesentery swirling (4:61). Appendix appears normal. Vascular/Lymphatic: No abdominal aorta or iliac aneurysm. Mild atherosclerotic plaque of the aorta and its branches. No abdominal, pelvic, or inguinal lymphadenopathy. Reproductive: Left corpus luteum cyst-no further follow-up indicated. Interval development of a 3.7 x 3.1 cm right ovarian cystic lesion-no further follow-up indicated. Status post hysterectomy. Other: Nonspecific trace pelvic intraperitoneal simple free fluid. No intraperitoneal free gas. No organized fluid collection. Musculoskeletal: Tiny fat containing supraumbilical ventral hernia (4:18). No suspicious lytic or blastic osseous lesions. No acute displaced fracture. Multilevel degenerative changes of the spine. Review of the MIP images confirms the above  findings. IMPRESSION: 1. No pulmonary embolus. 2. Developing bilateral lower lobe and lingular patchy peribronchovascular ground-glass airspace opacities. Findings could represent developing infection/inflammation. 3. Interval development of nonspecific borderline enlarged mediastinal and interlobar lymph nodes likely reactive in etiology. Recommend attention on follow. 4. No acute intra-abdominal or intrapelvic abnormality. 5. Status post hysterectomy. 6.  Aortic Atherosclerosis (ICD10-I70.0). Electronically Signed   By: Tish Frederickson M.D.   On: 07/13/2023 01:40   CT ABDOMEN PELVIS W CONTRAST Result Date: 07/13/2023 CLINICAL DATA:  chest pain, elevated trops. eval PE. hx AVR; epigastric pain, emesis. hx chronic pancreatitis EXAM: CT ANGIOGRAPHY CHEST CT ABDOMEN AND PELVIS WITH CONTRAST TECHNIQUE:  Multidetector CT imaging of the chest was performed using the standard protocol during bolus administration of intravenous contrast. Multiplanar CT image reconstructions and MIPs were obtained to evaluate the vascular anatomy. Multidetector CT imaging of the abdomen and pelvis was performed using the standard protocol during bolus administration of intravenous contrast. RADIATION DOSE REDUCTION: This exam was performed according to the departmental dose-optimization program which includes automated exposure control, adjustment of the mA and/or kV according to patient size and/or use of iterative reconstruction technique. CONTRAST:  OMNIPAQUE IOHEXOL 350 MG/ML SOLN COMPARISON:  CT abdomen pelvis 03/11/2023 CT angio chest abdomen pelvis 08/24/2022 FINDINGS: CTA CHEST FINDINGS Cardiovascular: Satisfactory opacification of the pulmonary arteries to the segmental level. No evidence of pulmonary embolism. The main pulmonary artery is normal in caliber. Normal heart size. No significant pericardial effusion. The thoracic aorta is normal in caliber. No atherosclerotic plaque of the thoracic aorta. No coronary artery calcifications. Aortic valve replacement. Mediastinum/Nodes: Interval resolution of anterior mediastinal soft tissue density. Interval development of borderline enlarged mediastinal and inter lobar lymph nodes measuring up to 1 cm (2:36 737). No enlarged hilar or axillary lymph nodes. Thyroid gland, trachea, and esophagus demonstrate no significant findings. Lungs/Pleura: Developing bilateral lower lobe and lingular patchy peribronchovascular ground-glass airspace opacities. No pulmonary nodule. No pulmonary mass. No pleural effusion. No pneumothorax. Musculoskeletal: No chest wall abnormality. No suspicious lytic or blastic osseous lesions. No acute displaced fracture. Multilevel degenerative changes of the spine. Intact sternotomy wires. Review of the MIP images confirms the above findings. CT ABDOMEN  and PELVIS FINDINGS Hepatobiliary: No focal liver abnormality. Status post cholecystectomy. No biliary dilatation. Pancreas: No focal lesion. Normal pancreatic contour. No surrounding inflammatory changes. No main pancreatic ductal dilatation. Spleen: Normal in size without focal abnormality. Adrenals/Urinary Tract: No adrenal nodule bilaterally. Bilateral kidneys enhance symmetrically. No hydronephrosis. No hydroureter. The urinary bladder is unremarkable. Stomach/Bowel: Stomach is within normal limits. No evidence of bowel wall thickening or dilatation. Nonspecific persistent small bowel mesentery swirling (4:61). Appendix appears normal. Vascular/Lymphatic: No abdominal aorta or iliac aneurysm. Mild atherosclerotic plaque of the aorta and its branches. No abdominal, pelvic, or inguinal lymphadenopathy. Reproductive: Left corpus luteum cyst-no further follow-up indicated. Interval development of a 3.7 x 3.1 cm right ovarian cystic lesion-no further follow-up indicated. Status post hysterectomy. Other: Nonspecific trace pelvic intraperitoneal simple free fluid. No intraperitoneal free gas. No organized fluid collection. Musculoskeletal: Tiny fat containing supraumbilical ventral hernia (4:18). No suspicious lytic or blastic osseous lesions. No acute displaced fracture. Multilevel degenerative changes of the spine. Review of the MIP images confirms the above findings. IMPRESSION: 1. No pulmonary embolus. 2. Developing bilateral lower lobe and lingular patchy peribronchovascular ground-glass airspace opacities. Findings could represent developing infection/inflammation. 3. Interval development of nonspecific borderline enlarged mediastinal and interlobar lymph nodes likely reactive in etiology. Recommend attention on follow. 4. No acute intra-abdominal  or intrapelvic abnormality. 5. Status post hysterectomy. 6.  Aortic Atherosclerosis (ICD10-I70.0). Electronically Signed   By: Tish Frederickson M.D.   On: 07/13/2023  01:40   DG Chest 2 View Result Date: 07/12/2023 CLINICAL DATA:  Chest pain EXAM: CHEST - 2 VIEW COMPARISON:  03/20/2023 FINDINGS: Prior median sternotomy and valve replacement. Heart is borderline in size. Lungs clear. No effusions or edema. No acute bony abnormality IMPRESSION: No active cardiopulmonary disease. Electronically Signed   By: Charlett Nose M.D.   On: 07/12/2023 20:10    ECHO ordered  TELEMETRY reviewed by me 07/13/2023: Sinus rhythm rate 60s  EKG reviewed by me: Sinus rhythm rate 71 bpm, nonacute  Data reviewed by me 07/13/2023: last 24h vitals tele labs imaging I/O ED provider note, admission H&P  Principal Problem:   Elevated troponin Active Problems:   Hypertensive urgency   Hypokalemia   Intractable nausea and vomiting   (HFpEF) heart failure with preserved ejection fraction (HCC)   Hypomagnesemia   Obesity, Class III, BMI 40-49.9 (morbid obesity) (HCC)   Subtherapeutic international normalized ratio (INR)   Chronic use of opiate drug for therapeutic purpose   History of heavy alcohol consumption   Chronic pancreatitis (HCC)   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 female  with a past medical history of severe aortic stenosis s/p mechanical AVR 04/10/2022, ascending aortic aneurysm s/p repair 03/2022, chronic diastolic heart failure, hypertension, hyperlipidemia, alcohol induced chronic pancreatitis, chronic pain who presented to the ED on 07/12/2023 for abdominal pain and vomiting.  Troponin checked and found to be mildly elevated and flat.  Cardiology was consulted for further evaluation.   # Demand ischemia # Chronic alcoholic pancreatitis # Intractable nausea/vomiting # Hypertensive urgency With abdominal pain and vomiting for few days prior to presentation.  Similar to prior episodes of pancreatitis.  Also with significantly  elevated BP in the ED.  Troponins trended 117 > 114.  Denies chest pain.  EKG nonacute. -Discontinue heparin infusion.   -Mildly elevated and flat trending troponin most consistent with demand/supply mismatch and not ACS. -Management of pancreatitis as per primary.  # S/p mechanical AVR 03/2022 # S/p ascending aortic aneurysm repair 03/2022 # Chronic HFpEF Patient with history of severe aortic stenosis and ascending aortic aneurysm status post mechanical AVR and repair of aneurysm 03/2022 at [facility] with Dr. Kizzie Bane.  -Echo ordered for updated evaluation of valve function.  -Resume warfarin as per pharmacy.  INR goal of 2-3. -Patient will need to follow up with outpatient cardiologist.    This patient's plan of care was discussed and created with Dr. Darrold Junker and he is in agreement.  Signed: Gale Journey, PA-C  07/13/2023, 11:22 AM [facility]

## 2023-07-13 NOTE — Assessment & Plan Note (Signed)
In remission.

## 2023-07-13 NOTE — H&P (Signed)
History and Physical    Patient: Sarah Sosa WUJ:[id number] DOB: 02/11/1981 DOA: 07/12/2023 DOS: the patient was seen and examined on 07/13/2023 PCP: Clinic, [facility]  Patient coming from: Home  Chief Complaint:  Chief Complaint  Patient presents with   Abdominal Pain   Chest Pain    HPI: Hani Bialy is a 42 y.o. female with medical history significant for Chronic diastolic CHF, nonrheumatic aortic insufficiency s/p mechanical AVR at [facility] 03/2022 on Coumadin,, HTN, ascending aortic aneurysm, asthma, hypertension, morbid obesity chronic pain on suboxone, methadone and oxycodone from the [facility], peripheral neuropathy and alcohol induced chronic pancreatitis, who presents to the ED with a 3-day history of abdominal pain typical for her pancreatitis and a 1 day history of vomiting.  She has had little relief because she is unable to keep down her pain meds.  Additionally states she has a cough for the past 2 weeks with occasional subjective fevers and her chest feels rattly.  She denies chest pains or shortness of breath. ED course and data review: BP 198/122 with otherwise normal vitals Labs notable forTroponin 117--114, potassium 3.1.  Lipase 21 and LFTs not done.  INR 1.1, magnesium 1.3 and potassium 3.1. EKG, personally viewed and interpreted showing NSR at 69 with no ischemic changes CTA PE chest negative for PE but showing  CT abdomen and pelvis with no acute intra-abdominal or intrapelvic abnormality  Patient was treated with hydromorphone and metoclopramide, famotidine but continued to have ongoing pain. She was started on ceftriaxone and azithromycin for pneumonia seen on CTA chest Started on aspirin and heparin infusion for elevated troponin  Hospitalist consulted for admission.   Review of Systems: As mentioned in the history of present illness. All other systems reviewed and are negative.  Past Medical History:  Diagnosis Date   Arthritis    Asthma     Chronic pain disorder 03/19/2018   GERD (gastroesophageal reflux disease)    Headache    Heart murmur    History of trichomoniasis 03/19/2018   Hypertension    Intractable nausea and vomiting    Pancreatitis    PID (pelvic inflammatory disease) 02/14/2018   Uterine leiomyoma 03/19/2018   Past Surgical History:  Procedure Laterality Date   ABDOMINAL HYSTERECTOMY     AORTIC VALVE REPLACEMENT  03/2022   CHOLECYSTECTOMY     DENTAL SURGERY     HYSTERECTOMY ABDOMINAL WITH SALPINGECTOMY Bilateral 04/15/2018   Procedure: HYSTERECTOMY ABDOMINAL WITH BILATERAL SALPINGECTOMY;  Surgeon: Hildred Laser, MD;  Location: [facility] ORS;  Service: Gynecology;  Laterality: Bilateral;   kenn surgery Bilateral    KNEE ARTHROSCOPY Left 2012, 2013   Social History:  reports that she has been smoking cigarettes. She has a 11 pack-year smoking history. She has never used smokeless tobacco. She reports that she does not currently use alcohol. She reports current drug use. Drug: Marijuana.  Allergies  Allergen Reactions   Latex Anaphylaxis, Hives and Itching    Other reaction(s): Other (See Comments) SKIN BURNING & PEELING    Nsaids Other (See Comments)    Serve ulcers; stomach aches, GERD.  Other Reaction(s): Other (See Comments)   Tomato Anaphylaxis, Hives and Itching   Gabapentin Palpitations    Has heart condition; also makes jittery   Metformin     Other Reaction(s): Abdominal Pain  Causes pancreatitis to worsen   Lisinopril     Other Reaction(s): Angioedema   Tramadol Itching and Hives   Duloxetine Anxiety    Makes her jittery  Family History  Problem Relation Age of Onset   Fibroids Mother    Hypertension Mother    Arthritis Mother     Prior to Admission medications   Medication Sig Start Date End Date Taking? Authorizing Provider  acetaminophen (TYLENOL) 500 MG tablet Take 1,000 mg by mouth 3 (three) times daily as needed for mild pain.    [provider]  amitriptyline  (ELAVIL) 10 MG tablet Take 10 mg by mouth 3 (three) times daily as needed. 11/02/22   [provider]  aspirin 81 MG chewable tablet Chew 81 mg by mouth daily. 08/04/22 08/04/23  [provider]  atorvastatin (LIPITOR) 40 MG tablet Take 40 mg by mouth daily. 10/17/22   [provider]  bisacodyl (DULCOLAX) 5 MG EC tablet Take 1 tablet (5 mg total) by mouth daily as needed for moderate constipation. 08/24/22 08/24/23  Orvil Feil, PA-C  Buprenorphine HCl-Naloxone HCl 8-2 MG FILM Place 1 Film under the tongue 4 (four) times daily as needed. 03/14/23   [provider]  chlorhexidine (PERIDEX) 0.12 % solution Use as directed 15 mLs in the mouth or throat 2 (two) times daily. 11/14/21   [provider]  dicyclomine (BENTYL) 10 MG capsule Take 1 capsule (10 mg total) by mouth 4 (four) times daily -  before meals and at bedtime. 03/11/23   Cuthriell, Delorise Royals, PA-C  feeding supplement (ENSURE ENLIVE / ENSURE PLUS) LIQD Take 237 mLs by mouth 2 (two) times daily between meals. 11/27/21   Alford Highland, MD  folic acid (FOLVITE) 1 MG tablet Take 1 tablet (1 mg total) by mouth daily. 11/27/21   Alford Highland, MD  furosemide (LASIX) 40 MG tablet Take 40-80 mg by mouth daily as needed. 09/14/21   [provider]  loperamide (IMODIUM A-D) 2 MG tablet Take 1 tablet (2 mg total) by mouth 4 (four) times daily as needed. 03/11/23   Cuthriell, Delorise Royals, PA-C  Magnesium Oxide 400 MG CAPS Take 1 capsule (400 mg total) by mouth daily. 11/27/21   Alford Highland, MD  melatonin 3 MG TABS tablet Take 3 mg by mouth at bedtime.    [provider]  metFORMIN (GLUCOPHAGE) 500 MG tablet Take 500 mg by mouth every morning. 10/17/22   [provider]  methadone (DOLOPHINE) 10 MG tablet Take 10 mg by mouth 4 (four) times daily. 03/14/23   [provider]  Multiple Vitamin (MULTIVITAMIN WITH MINERALS) TABS tablet Take 1 tablet by mouth daily. 11/27/21   Alford Highland, MD  naloxone [facility]) nasal spray 4 mg/0.1 mL Place 1 spray into the nose once as needed (to reverse overdose). 02/24/23   [provider]  omeprazole (PRILOSEC) 40 MG capsule Take 40 mg by mouth daily. 02/23/23   [provider]  ondansetron (ZOFRAN-ODT) 4 MG disintegrating tablet Take 1 tablet (4 mg total) by mouth every 8 (eight) hours as needed. 03/11/23   Cuthriell, Delorise Royals, PA-C  Oxycodone HCl 10 MG TABS Take 1 tablet (10 mg total) by mouth every 6 (six) hours as needed. 02/09/22   Willy Eddy, MD  pantoprazole (PROTONIX) 40 MG tablet Take 1 tablet (40 mg total) by mouth daily. 10/08/21 10/08/22  Minna Antis, MD  spironolactone (ALDACTONE) 50 MG tablet Take 50 mg by mouth daily. 02/03/22   [provider]  sucralfate (CARAFATE) 1 g tablet Take 1 tablet (1 g total) by mouth 4 (four) times daily. 10/08/21 10/08/22  Minna Antis, MD  thiamine 100 MG  tablet Take 1 tablet (100 mg total) by mouth daily. 11/27/21   Alford Highland, MD  traZODone (DESYREL) 50 MG tablet Take 50 mg by mouth at bedtime. 09/07/21   [provider]  warfarin (COUMADIN) 7.5 MG tablet Take 3.25-7.5 mg by mouth daily. Take 3.75mg  (1/2 tab) M/W/F and 7.5mg  (1 tab) all other days    [provider]    Physical Exam: Vitals:   07/13/23 0200 07/13/23 0235 07/13/23 0245 07/13/23 0303  BP:   101/82   Pulse: 73 70 69   Resp: 15 14 19    Temp:    98.4 F (36.9 C)  TempSrc:    Oral  SpO2: 98% 97% 99%   Weight:      Height:       Physical Exam Vitals and nursing note reviewed.  Constitutional:      General: She is not in acute distress. HENT:     Head: Normocephalic and atraumatic.  Cardiovascular:     Rate and Rhythm: Normal rate and regular rhythm.     Heart sounds: Normal heart sounds.  Pulmonary:     Effort: Pulmonary effort is normal.     Breath sounds: Normal breath sounds.  Abdominal:     Palpations: Abdomen is soft.     Tenderness: There is  no abdominal tenderness.  Neurological:     Mental Status: Mental status is at baseline.     Labs on Admission: I have personally reviewed following labs and imaging studies  CBC: Recent Labs  Lab 07/12/23 2038  WBC 6.0  HGB 14.9  HCT 44.6  MCV 92.0  PLT 248   Basic Metabolic Panel: Recent Labs  Lab 07/12/23 2038 07/13/23 0028  NA 137  --   K 3.1*  --   CL 98  --   CO2 26  --   GLUCOSE 97  --   BUN 8  --   CREATININE 0.83  --   CALCIUM 9.1  --   MG  --  1.3*   GFR: Estimated Creatinine Clearance: 121.5 mL/min (by C-G formula based on SCr of 0.83 mg/dL). Liver Function Tests: No results for input(s): "AST", "ALT", "ALKPHOS", "BILITOT", "PROT", "ALBUMIN" in the last 168 hours. Recent Labs  Lab 07/12/23 2038  LIPASE 21   No results for input(s): "AMMONIA" in the last 168 hours. Coagulation Profile: Recent Labs  Lab 07/12/23 2038  INR 1.1   Cardiac Enzymes: No results for input(s): "CKTOTAL", "CKMB", "CKMBINDEX", "TROPONINI" in the last 168 hours. BNP (last 3 results) No results for input(s): "PROBNP" in the last 8760 hours. HbA1C: No results for input(s): "HGBA1C" in the last 72 hours. CBG: No results for input(s): "GLUCAP" in the last 168 hours. Lipid Profile: No results for input(s): "CHOL", "HDL", "LDLCALC", "TRIG", "CHOLHDL", "LDLDIRECT" in the last 72 hours. Thyroid Function Tests: No results for input(s): "TSH", "T4TOTAL", "FREET4", "T3FREE", "THYROIDAB" in the last 72 hours. Anemia Panel: No results for input(s): "VITAMINB12", "FOLATE", "FERRITIN", "TIBC", "IRON", "RETICCTPCT" in the last 72 hours. Urine analysis:    Component Value Date/Time   COLORURINE YELLOW (A) 02/26/2022 0108   APPEARANCEUR CLEAR (A) 02/26/2022 0108   LABSPEC >1.046 (H) 02/26/2022 0108   PHURINE 6.0 02/26/2022 0108   GLUCOSEU NEGATIVE 02/26/2022 0108   HGBUR NEGATIVE 02/26/2022 0108   BILIRUBINUR NEGATIVE 02/26/2022 0108   KETONESUR NEGATIVE 02/26/2022 0108    PROTEINUR NEGATIVE 02/26/2022 0108   NITRITE NEGATIVE 02/26/2022 0108   LEUKOCYTESUR NEGATIVE 02/26/2022 0108    Radiological  Exams on Admission: CT Angio Chest PE W and/or Wo Contrast Result Date: 07/13/2023 CLINICAL DATA:  chest pain, elevated trops. eval PE. hx AVR; epigastric pain, emesis. hx chronic pancreatitis EXAM: CT ANGIOGRAPHY CHEST CT ABDOMEN AND PELVIS WITH CONTRAST TECHNIQUE: Multidetector CT imaging of the chest was performed using the standard protocol during bolus administration of intravenous contrast. Multiplanar CT image reconstructions and MIPs were obtained to evaluate the vascular anatomy. Multidetector CT imaging of the abdomen and pelvis was performed using the standard protocol during bolus administration of intravenous contrast. RADIATION DOSE REDUCTION: This exam was performed according to the departmental dose-optimization program which includes automated exposure control, adjustment of the mA and/or kV according to patient size and/or use of iterative reconstruction technique. CONTRAST:  OMNIPAQUE IOHEXOL 350 MG/ML SOLN COMPARISON:  CT abdomen pelvis 03/11/2023 CT angio chest abdomen pelvis 08/24/2022 FINDINGS: CTA CHEST FINDINGS Cardiovascular: Satisfactory opacification of the pulmonary arteries to the segmental level. No evidence of pulmonary embolism. The main pulmonary artery is normal in caliber. Normal heart size. No significant pericardial effusion. The thoracic aorta is normal in caliber. No atherosclerotic plaque of the thoracic aorta. No coronary artery calcifications. Aortic valve replacement. Mediastinum/Nodes: Interval resolution of anterior mediastinal soft tissue density. Interval development of borderline enlarged mediastinal and inter lobar lymph nodes measuring up to 1 cm (2:36 737). No enlarged hilar or axillary lymph nodes. Thyroid gland, trachea, and esophagus demonstrate no significant findings. Lungs/Pleura: Developing bilateral lower lobe and  lingular patchy peribronchovascular ground-glass airspace opacities. No pulmonary nodule. No pulmonary mass. No pleural effusion. No pneumothorax. Musculoskeletal: No chest wall abnormality. No suspicious lytic or blastic osseous lesions. No acute displaced fracture. Multilevel degenerative changes of the spine. Intact sternotomy wires. Review of the MIP images confirms the above findings. CT ABDOMEN and PELVIS FINDINGS Hepatobiliary: No focal liver abnormality. Status post cholecystectomy. No biliary dilatation. Pancreas: No focal lesion. Normal pancreatic contour. No surrounding inflammatory changes. No main pancreatic ductal dilatation. Spleen: Normal in size without focal abnormality. Adrenals/Urinary Tract: No adrenal nodule bilaterally. Bilateral kidneys enhance symmetrically. No hydronephrosis. No hydroureter. The urinary bladder is unremarkable. Stomach/Bowel: Stomach is within normal limits. No evidence of bowel wall thickening or dilatation. Nonspecific persistent small bowel mesentery swirling (4:61). Appendix appears normal. Vascular/Lymphatic: No abdominal aorta or iliac aneurysm. Mild atherosclerotic plaque of the aorta and its branches. No abdominal, pelvic, or inguinal lymphadenopathy. Reproductive: Left corpus luteum cyst-no further follow-up indicated. Interval development of a 3.7 x 3.1 cm right ovarian cystic lesion-no further follow-up indicated. Status post hysterectomy. Other: Nonspecific trace pelvic intraperitoneal simple free fluid. No intraperitoneal free gas. No organized fluid collection. Musculoskeletal: Tiny fat containing supraumbilical ventral hernia (4:18). No suspicious lytic or blastic osseous lesions. No acute displaced fracture. Multilevel degenerative changes of the spine. Review of the MIP images confirms the above findings. IMPRESSION: 1. No pulmonary embolus. 2. Developing bilateral lower lobe and lingular patchy peribronchovascular ground-glass airspace opacities. Findings  could represent developing infection/inflammation. 3. Interval development of nonspecific borderline enlarged mediastinal and interlobar lymph nodes likely reactive in etiology. Recommend attention on follow. 4. No acute intra-abdominal or intrapelvic abnormality. 5. Status post hysterectomy. 6.  Aortic Atherosclerosis (ICD10-I70.0). Electronically Signed   By: Tish Frederickson M.D.   On: 07/13/2023 01:40   CT ABDOMEN PELVIS W CONTRAST Result Date: 07/13/2023 CLINICAL DATA:  chest pain, elevated trops. eval PE. hx AVR; epigastric pain, emesis. hx chronic pancreatitis EXAM: CT ANGIOGRAPHY CHEST CT ABDOMEN AND PELVIS WITH CONTRAST TECHNIQUE: Multidetector CT imaging of the  chest was performed using the standard protocol during bolus administration of intravenous contrast. Multiplanar CT image reconstructions and MIPs were obtained to evaluate the vascular anatomy. Multidetector CT imaging of the abdomen and pelvis was performed using the standard protocol during bolus administration of intravenous contrast. RADIATION DOSE REDUCTION: This exam was performed according to the departmental dose-optimization program which includes automated exposure control, adjustment of the mA and/or kV according to patient size and/or use of iterative reconstruction technique. CONTRAST:  OMNIPAQUE IOHEXOL 350 MG/ML SOLN COMPARISON:  CT abdomen pelvis 03/11/2023 CT angio chest abdomen pelvis 08/24/2022 FINDINGS: CTA CHEST FINDINGS Cardiovascular: Satisfactory opacification of the pulmonary arteries to the segmental level. No evidence of pulmonary embolism. The main pulmonary artery is normal in caliber. Normal heart size. No significant pericardial effusion. The thoracic aorta is normal in caliber. No atherosclerotic plaque of the thoracic aorta. No coronary artery calcifications. Aortic valve replacement. Mediastinum/Nodes: Interval resolution of anterior mediastinal soft tissue density. Interval development of borderline  enlarged mediastinal and inter lobar lymph nodes measuring up to 1 cm (2:36 737). No enlarged hilar or axillary lymph nodes. Thyroid gland, trachea, and esophagus demonstrate no significant findings. Lungs/Pleura: Developing bilateral lower lobe and lingular patchy peribronchovascular ground-glass airspace opacities. No pulmonary nodule. No pulmonary mass. No pleural effusion. No pneumothorax. Musculoskeletal: No chest wall abnormality. No suspicious lytic or blastic osseous lesions. No acute displaced fracture. Multilevel degenerative changes of the spine. Intact sternotomy wires. Review of the MIP images confirms the above findings. CT ABDOMEN and PELVIS FINDINGS Hepatobiliary: No focal liver abnormality. Status post cholecystectomy. No biliary dilatation. Pancreas: No focal lesion. Normal pancreatic contour. No surrounding inflammatory changes. No main pancreatic ductal dilatation. Spleen: Normal in size without focal abnormality. Adrenals/Urinary Tract: No adrenal nodule bilaterally. Bilateral kidneys enhance symmetrically. No hydronephrosis. No hydroureter. The urinary bladder is unremarkable. Stomach/Bowel: Stomach is within normal limits. No evidence of bowel wall thickening or dilatation. Nonspecific persistent small bowel mesentery swirling (4:61). Appendix appears normal. Vascular/Lymphatic: No abdominal aorta or iliac aneurysm. Mild atherosclerotic plaque of the aorta and its branches. No abdominal, pelvic, or inguinal lymphadenopathy. Reproductive: Left corpus luteum cyst-no further follow-up indicated. Interval development of a 3.7 x 3.1 cm right ovarian cystic lesion-no further follow-up indicated. Status post hysterectomy. Other: Nonspecific trace pelvic intraperitoneal simple free fluid. No intraperitoneal free gas. No organized fluid collection. Musculoskeletal: Tiny fat containing supraumbilical ventral hernia (4:18). No suspicious lytic or blastic osseous lesions. No acute displaced fracture.  Multilevel degenerative changes of the spine. Review of the MIP images confirms the above findings. IMPRESSION: 1. No pulmonary embolus. 2. Developing bilateral lower lobe and lingular patchy peribronchovascular ground-glass airspace opacities. Findings could represent developing infection/inflammation. 3. Interval development of nonspecific borderline enlarged mediastinal and interlobar lymph nodes likely reactive in etiology. Recommend attention on follow. 4. No acute intra-abdominal or intrapelvic abnormality. 5. Status post hysterectomy. 6.  Aortic Atherosclerosis (ICD10-I70.0). Electronically Signed   By: Tish Frederickson M.D.   On: 07/13/2023 01:40   DG Chest 2 View Result Date: 07/12/2023 CLINICAL DATA:  Chest pain EXAM: CHEST - 2 VIEW COMPARISON:  03/20/2023 FINDINGS: Prior median sternotomy and valve replacement. Heart is borderline in size. Lungs clear. No effusions or edema. No acute bony abnormality IMPRESSION: No active cardiopulmonary disease. Electronically Signed   By: Charlett Nose M.D.   On: 07/12/2023 20:10     Data Reviewed: Relevant notes from primary care and specialist visits, past discharge summaries as available in EHR, including Care Everywhere. Prior diagnostic testing  as pertinent to current admission diagnoses Updated medications and problem lists for reconciliation ED course, including vitals, labs, imaging, treatment and response to treatment Triage notes, nursing and pharmacy notes and ED provider's notes Notable results as noted in HPI   Assessment and Plan: * Elevated troponin Suspect demand ischemia related to intractable pain with vomiting as well as hypertensive urgency Troponin downtrending 117-114 Patient was placed on heparin drip from the ED for possible ACS but also for subtherapeutic INR Will get echocardiogram Cardiology consult  CAP (community acquired pneumonia) Rocephin and azithromycin Antitussives  Intractable nausea and vomiting Chronic  alcoholic pancreatitis Patient with pain typical for her to pancreatitis but unable to tolerate oral meds Lipase normal Pain control, antiemetics  Hypomagnesemia Hypokalemia Replete and monitor  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 at [facility] Subtherapeutic INR On heparin infusion Pharmacy consult for Coumadin  Hypertensive urgency IV BP control if unable to tolerate orally  (HFpEF) heart failure with preserved ejection fraction (HCC) Clinically euvolemic Continue spironolactone and Lasix  History of heavy alcohol consumption In remission  Chronic use of opiate drug for therapeutic purpose IV pain control while unable to tolerate orally  Obesity, Class III, BMI 40-49.9 (morbid obesity) (HCC) Complicating factor to overall prognosis and care     DVT prophylaxis: Heparin infusion  Consults: [facility] cardi[facility]  Advance Care Planning:   Code Status: Prior   Family Communication: none  Disposition Plan: Back to previous home environment  Severity of Illness: The appropriate patient status for this patient is INPATIENT. Inpatient status is judged to be reasonable and necessary in order to provide the required intensity of service to ensure the patient's safety. The patient's presenting symptoms, physical exam findings, and initial radiographic and laboratory data in the context of their chronic comorbidities is felt to place them at high risk for further clinical deterioration. Furthermore, it is not anticipated that the patient will be medically stable for discharge from the hospital within 2 midnights of admission.   * I certify that at the point of admission it is my clinical judgment that the patient will require inpatient hospital care spanning beyond 2 midnights from the point of admission due to high intensity of service, high risk for further deterioration and high frequency of surveillance required.*  Author: Andris Baumann,  MD 07/13/2023 4:01 AM  For on call review [email or web address].

## 2023-07-13 NOTE — Assessment & Plan Note (Signed)
IV BP control if unable to tolerate orally

## 2023-07-13 NOTE — ED Notes (Signed)
Md messaged in regard to pt requesting PRN oxycodone

## 2023-07-13 NOTE — Assessment & Plan Note (Signed)
Hypokalemia Replete and monitor

## 2023-07-13 NOTE — Assessment & Plan Note (Signed)
Subtherapeutic INR On heparin infusion Pharmacy consult for Coumadin

## 2023-07-13 NOTE — Assessment & Plan Note (Signed)
Rocephin and azithromycin Antitussives

## 2023-07-13 NOTE — ED Notes (Signed)
Up to bathroom  Pt is SOB with exertion  Denies any pain at present

## 2023-07-13 NOTE — Progress Notes (Addendum)
ANTICOAGULATION CONSULT NOTE  Pharmacy Consult for heparin infusion and warfarin  Indication: NSTEMI and Mechanical Valve  Allergies  Allergen Reactions   Latex Anaphylaxis, Hives and Itching    Other reaction(s): Other (See Comments) SKIN BURNING & PEELING    Nsaids Other (See Comments)    Serve ulcers; stomach aches, GERD.  Other Reaction(s): Other (See Comments)   Tomato Anaphylaxis, Hives and Itching   Gabapentin Palpitations    Has heart condition; also makes jittery   Metformin     Other Reaction(s): Abdominal Pain  Causes pancreatitis to worsen   Lisinopril     Other Reaction(s): Angioedema   Tramadol Itching and Hives   Duloxetine Anxiety    Makes her jittery    Patient Measurements: Height: 5\' 7"  (170.2 cm) Weight: 123.4 kg (272 lb) IBW/kg (Calculated) : 61.6 Heparin Dosing Weight: 90.9 kg  Vital Signs: Temp: 98 F (36.7 C) (12/27 0758) Temp Source: Oral (12/27 0758) BP: 127/89 (12/27 0755) Pulse Rate: 70 (12/27 0755)  Labs: Recent Labs    07/12/23 2038 07/13/23 0028 07/13/23 0503 07/13/23 0934  HGB 14.9  --   --   --   HCT 44.6  --   --   --   PLT 248  --   --   --   APTT  --   --  119*  --   LABPROT 14.0  --   --   --   INR 1.1  --   --   --   HEPARINUNFRC  --   --   --  0.12*  CREATININE 0.83  --   --   --   TROPONINIHS 117* 114*  --   --     Estimated Creatinine Clearance: 121.5 mL/min (by C-G formula based on SCr of 0.83 mg/dL).   Medical History: Past Medical History:  Diagnosis Date   Arthritis    Asthma    Chronic pain disorder 03/19/2018   GERD (gastroesophageal reflux disease)    Headache    Heart murmur    History of trichomoniasis 03/19/2018   Hypertension    Intractable nausea and vomiting    Pancreatitis    PID (pelvic inflammatory disease) 02/14/2018   Uterine leiomyoma 03/19/2018    Assessment: Pt is a 42 yo female presenting to ED abdominal and chest pain found with elevated troponin I level.  Pt with hx of  aortic valve replacement on warfarin at home.  INR at admission 1.1. INR likely below goal given 3-day history of abdominal pain, 1-day history of vomiting, and inability to keep her meds down. Pharmacy was consulted to initiate patient on a heparin infusion and re-start home warfarin.  Warfarin home dose: 7.5 mg T/Th and 3.25 mg all other days Baseline INR: 1.3    Heparin assessment: Time/Date HL Rate  Comment 12/27 0934 0.12 1200 units/hr Subtherapeutic   Warfarin assessment: Date INR Warfarin Dose  12/27 1.3 5 mg (ordered)        Goal of Therapy:  Heparin level 0.3-0.7 units/ml INR goal 1.5 to 2 - per chart review from anti-coag visit 06/01/23 Monitor platelets by anticoagulation protocol: Yes   Heparin Plan:  Heparin level subtherapeutic Give 2700 unit bolus x 1 Increase heparin infusion rate to 1500 units/hr  Will check heparin level in 6 hours after rate change Continue to monitor CBC daily while on heparin   Warfarin Plan:  INR this morning is subtherapeutic Will give 5mg  x 1 today (~1.5 times home dose) Continue  to monitor INR daily   Paulita Fujita, PharmD Clinical Pharmacist 07/13/2023 10:35 AM

## 2023-07-13 NOTE — Assessment & Plan Note (Signed)
IV pain control while unable to tolerate orally

## 2023-07-13 NOTE — Progress Notes (Signed)
ANTICOAGULATION CONSULT NOTE  Pharmacy Consult for heparin infusion Indication: NSTEMI  Allergies  Allergen Reactions   Latex Anaphylaxis, Hives and Itching    Other reaction(s): Other (See Comments) SKIN BURNING & PEELING    Nsaids Other (See Comments)    Serve ulcers; stomach aches, GERD.  Other Reaction(s): Other (See Comments)   Tomato Anaphylaxis, Hives and Itching   Gabapentin Palpitations    Has heart condition; also makes jittery   Metformin     Other Reaction(s): Abdominal Pain  Causes pancreatitis to worsen   Lisinopril     Other Reaction(s): Angioedema   Tramadol Itching and Hives   Duloxetine Anxiety    Makes her jittery    Patient Measurements: Height: 5\' 7"  (170.2 cm) Weight: 123.4 kg (272 lb) IBW/kg (Calculated) : 61.6 Heparin Dosing Weight: 90.9 kg  Vital Signs: Temp: 98 F (36.7 C) (12/26 1941) Temp Source: Oral (12/26 1941) BP: 148/101 (12/27 0030) Pulse Rate: 58 (12/27 0030)  Labs: Recent Labs    07/12/23 2038 07/13/23 0028  HGB 14.9  --   HCT 44.6  --   PLT 248  --   LABPROT 14.0  --   INR 1.1  --   CREATININE 0.83  --   TROPONINIHS 117* 114*    Estimated Creatinine Clearance: 121.5 mL/min (by C-G formula based on SCr of 0.83 mg/dL).   Medical History: Past Medical History:  Diagnosis Date   Arthritis    Asthma    Chronic pain disorder 03/19/2018   GERD (gastroesophageal reflux disease)    Headache    Heart murmur    History of trichomoniasis 03/19/2018   Hypertension    Intractable nausea and vomiting    Pancreatitis    PID (pelvic inflammatory disease) 02/14/2018   Uterine leiomyoma 03/19/2018    Assessment: Pt is a 42 yo female presenting to ED abdominal and chest pain found with elevated troponin I level.  Pt with hx of aortic valve replacement on warfarin.  INR at admission 1.1.  Goal of Therapy:  Heparin level 0.3-0.7 units/ml Monitor platelets by anticoagulation protocol: Yes   Plan:  Bolus 4000 units x  1 Start heparin infusion at 1200 units/hr Will check HL in 6 hr after start of infusion CBC daily while on heparin  Otelia Sergeant, PharmD, [facility], [place] 07/13/2023 2:07 AM

## 2023-07-13 NOTE — Assessment & Plan Note (Addendum)
Suspect demand ischemia related to intractable pain with vomiting as well as hypertensive urgency Troponin downtrending 117-114 Patient was placed on heparin drip from the ED for possible ACS but also for subtherapeutic INR Will get echocardiogram Cardiology consult

## 2023-07-14 DIAGNOSIS — Z952 Presence of prosthetic heart valve: Secondary | ICD-10-CM

## 2023-07-14 DIAGNOSIS — I214 Non-ST elevation (NSTEMI) myocardial infarction: Secondary | ICD-10-CM | POA: Diagnosis not present

## 2023-07-14 DIAGNOSIS — I5032 Chronic diastolic (congestive) heart failure: Secondary | ICD-10-CM

## 2023-07-14 DIAGNOSIS — Z79891 Long term (current) use of opiate analgesic: Secondary | ICD-10-CM

## 2023-07-14 DIAGNOSIS — I5030 Unspecified diastolic (congestive) heart failure: Secondary | ICD-10-CM | POA: Diagnosis not present

## 2023-07-14 DIAGNOSIS — R7989 Other specified abnormal findings of blood chemistry: Secondary | ICD-10-CM | POA: Diagnosis not present

## 2023-07-14 DIAGNOSIS — J189 Pneumonia, unspecified organism: Secondary | ICD-10-CM | POA: Diagnosis not present

## 2023-07-14 DIAGNOSIS — R791 Abnormal coagulation profile: Secondary | ICD-10-CM | POA: Diagnosis not present

## 2023-07-14 DIAGNOSIS — E876 Hypokalemia: Secondary | ICD-10-CM

## 2023-07-14 LAB — LIPOPROTEIN A (LPA): Lipoprotein (a): 91.3 nmol/L — ABNORMAL HIGH (ref <75.0)

## 2023-07-14 LAB — BASIC METABOLIC PANEL
Anion gap: 7 (ref 5–15)
BUN: 7 mg/dL (ref 6–20)
CO2: 27 mmol/L (ref 22–32)
Calcium: 8.3 mg/dL — ABNORMAL LOW (ref 8.9–10.3)
Chloride: 105 mmol/L (ref 98–111)
Creatinine, Ser: 0.71 mg/dL (ref 0.44–1.00)
GFR, Estimated: >60 mL/min (ref >60)
Glucose, Bld: 121 mg/dL — ABNORMAL HIGH (ref 70–99)
Potassium: 3.3 mmol/L — ABNORMAL LOW (ref 3.5–5.1)
Sodium: 139 mmol/L (ref 135–145)

## 2023-07-14 LAB — CBC
HCT: 36.1 % (ref 36.0–46.0)
Hemoglobin: 12 g/dL (ref 12.0–15.0)
MCH: 30.1 pg (ref 26.0–34.0)
MCHC: 33.2 g/dL (ref 30.0–36.0)
MCV: 90.5 fL (ref 80.0–100.0)
Platelets: 215 10*3/uL (ref 150–400)
RBC: 3.99 MIL/uL (ref 3.87–5.11)
RDW: 13.9 % (ref 11.5–15.5)
WBC: 6 10*3/uL (ref 4.0–10.5)
nRBC: 0 % (ref 0.0–0.2)

## 2023-07-14 LAB — PROTIME-INR
INR: 1.1 (ref 0.8–1.2)
Prothrombin Time: 14 s (ref 11.4–15.2)

## 2023-07-14 MED ORDER — ENOXAPARIN SODIUM 120 MG/0.8ML IJ SOSY
1.0000 mg/kg | PREFILLED_SYRINGE | Freq: Two times a day (BID) | INTRAMUSCULAR | Status: DC
  Administered 2023-07-14 – 2023-07-18 (×9): 120 mg via SUBCUTANEOUS
  Filled 2023-07-14 (×10): qty 0.8

## 2023-07-14 MED ORDER — WARFARIN SODIUM 5 MG PO TABS
5.0000 mg | ORAL_TABLET | Freq: Once | ORAL | Status: AC
  Administered 2023-07-14: 5 mg via ORAL
  Filled 2023-07-14: qty 1

## 2023-07-14 NOTE — Progress Notes (Signed)
Progress Note   Patient: Sarah Sosa ZOX:[id number] DOB: Sep 20, 1980 DOA: 07/12/2023     1 DOS: the patient was seen and examined on 07/14/2023   Brief hospital course: Sarah Sosa is a 42 y.o. female with medical history significant for Chronic diastolic CHF, nonrheumatic aortic insufficiency s/p mechanical AVR at [facility] 03/2022 on Coumadin,, HTN, ascending aortic aneurysm, asthma, hypertension, morbid obesity chronic pain on suboxone, methadone and oxycodone from the [facility] pain clinic, peripheral neuropathy and alcohol induced chronic pancreatitis, who presents to the ED with a 3-day history of abdominal pain typical for her pancreatitis and a 1 day history of vomiting.    Patient is admitted to the hospitalist service for further management evaluation of community-acquired pneumonia, acute on chronic alcoholic pancreatitis, elevated troponin, electrolyte abnormalities, subtherapeutic INR.  Assessment and Plan: * Elevated troponin Type II MI In the setting of intractable pain with vomiting as well as hypertensive urgency Troponin remain flat 117-114. Cardiology evaluation appreciated. DC heparin drip.  Outpatient [facility] cardiology follow-up. Continue Coumadin per pharmacy protocol. Echocardiogram pending.  CAP (community acquired pneumonia) Continue Rocephin and azithromycin Continue antitussives  Intractable nausea and vomiting Acute on chronic alcoholic pancreatitis Patient with pain typical for her to pancreatitis, she is unable to tolerate oral meds. Clear liquid diets and advance diet as tolerated. Continue pain control, antiemetics  Hypomagnesemia Hypokalemia Continue to replete potassium, magnesium.  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  Goal INR 2-3(patient reports her goal is 1.5-2 per outpatient heme-onc.) Discussed with pharmacy, will start Lovenox bridging Continue daily dosing of Coumadin  per pharmacy.  Hypertensive urgency BP better now with pain controlled IV hydralazine as needed for elevated blood pressures Continue Lasix, spironolactone.  (HFpEF) heart failure with preserved ejection fraction (HCC) Clinically euvolemic. Continue home dose spironolactone and Lasix  History of heavy alcohol consumption In remission. Cessation of alcohol advised.  Chronic use of opiate drug for therapeutic purpose IV pain control while unable to tolerate orally. Oral home meds will be restarted.  Obesity, Class III, BMI 40-49.9 (morbid obesity) (HCC) Complicating factor to overall prognosis and care Diet, exercise and weight reduction advised.    Out of bed to chair. Incentive spirometry. Nursing supportive care. Fall, aspiration precautions. DVT prophylaxis   Code Status: Full Code  Subjective: Patient is seen and examined today morning.  Pain better controlled with opiates.  Wishes to continue clears for today.  Nausea better.  States that her INR goal should be 1.5-2.  Discussed with her regarding Lovenox bridging.  She understands.  Physical Exam: Vitals:   07/13/23 2000 07/13/23 2127 07/14/23 0411 07/14/23 0838  BP: (!) 153/90 (!) 141/92 (!) 145/87 128/71  Pulse: 65 64 (!) 54 61  Resp: 11 18 18 18   Temp: 98.7 F (37.1 C) 98.7 F (37.1 C) 98.4 F (36.9 C) 97.8 F (36.6 C)  TempSrc: Oral  Oral   SpO2: 98% 100% 100% 100%  Weight:      Height:        General - Elderly obese African-American female, mild distress due to pain HEENT - PERRLA, EOMI, atraumatic head, non tender sinuses. Lung -distant breath sounds, basal rales, rhonchi, no wheezes. Heart - S1, S2 heard, no murmurs, rubs, trace pedal edema. Abdomen - Soft, non tender, obese, bowel sounds good Neuro - Alert, awake and oriented x 3, non focal exam. Skin - Warm and dry.  Data Reviewed:      Latest Ref Rng &  Units 07/14/2023    6:57 AM 07/12/2023    8:38 PM 03/20/2023    1:48 AM  CBC  WBC 4.0  - 10.5 K/uL 6.0  6.0  9.5   Hemoglobin 12.0 - 15.0 g/dL 16.1  09.6  04.5   Hematocrit 36.0 - 46.0 % 36.1  44.6  37.7   Platelets 150 - 400 K/uL 215  248  245       Latest Ref Rng & Units 07/14/2023    6:57 AM 07/12/2023    8:38 PM 03/20/2023    1:48 AM  BMP  Glucose 70 - 99 mg/dL 409  97  811   BUN 6 - 20 mg/dL 7  8  11    Creatinine 0.44 - 1.00 mg/dL 9.14  7.82  9.56   Sodium 135 - 145 mmol/L 139  137  138   Potassium 3.5 - 5.1 mmol/L 3.3  3.1  3.7   Chloride 98 - 111 mmol/L 105  98  97   CO2 22 - 32 mmol/L 27  26  33   Calcium 8.9 - 10.3 mg/dL 8.3  9.1  8.7    CT Angio Chest PE W and/or Wo Contrast Result Date: 07/13/2023 CLINICAL DATA:  chest pain, elevated trops. eval PE. hx AVR; epigastric pain, emesis. hx chronic pancreatitis EXAM: CT ANGIOGRAPHY CHEST CT ABDOMEN AND PELVIS WITH CONTRAST TECHNIQUE: Multidetector CT imaging of the chest was performed using the standard protocol during bolus administration of intravenous contrast. Multiplanar CT image reconstructions and MIPs were obtained to evaluate the vascular anatomy. Multidetector CT imaging of the abdomen and pelvis was performed using the standard protocol during bolus administration of intravenous contrast. RADIATION DOSE REDUCTION: This exam was performed according to the departmental dose-optimization program which includes automated exposure control, adjustment of the mA and/or kV according to patient size and/or use of iterative reconstruction technique. CONTRAST:  OMNIPAQUE IOHEXOL 350 MG/ML SOLN COMPARISON:  CT abdomen pelvis 03/11/2023 CT angio chest abdomen pelvis 08/24/2022 FINDINGS: CTA CHEST FINDINGS Cardiovascular: Satisfactory opacification of the pulmonary arteries to the segmental level. No evidence of pulmonary embolism. The main pulmonary artery is normal in caliber. Normal heart size. No significant pericardial effusion. The thoracic aorta is normal in caliber. No atherosclerotic plaque of the thoracic aorta.  No coronary artery calcifications. Aortic valve replacement. Mediastinum/Nodes: Interval resolution of anterior mediastinal soft tissue density. Interval development of borderline enlarged mediastinal and inter lobar lymph nodes measuring up to 1 cm (2:36 737). No enlarged hilar or axillary lymph nodes. Thyroid gland, trachea, and esophagus demonstrate no significant findings. Lungs/Pleura: Developing bilateral lower lobe and lingular patchy peribronchovascular ground-glass airspace opacities. No pulmonary nodule. No pulmonary mass. No pleural effusion. No pneumothorax. Musculoskeletal: No chest wall abnormality. No suspicious lytic or blastic osseous lesions. No acute displaced fracture. Multilevel degenerative changes of the spine. Intact sternotomy wires. Review of the MIP images confirms the above findings. CT ABDOMEN and PELVIS FINDINGS Hepatobiliary: No focal liver abnormality. Status post cholecystectomy. No biliary dilatation. Pancreas: No focal lesion. Normal pancreatic contour. No surrounding inflammatory changes. No main pancreatic ductal dilatation. Spleen: Normal in size without focal abnormality. Adrenals/Urinary Tract: No adrenal nodule bilaterally. Bilateral kidneys enhance symmetrically. No hydronephrosis. No hydroureter. The urinary bladder is unremarkable. Stomach/Bowel: Stomach is within normal limits. No evidence of bowel wall thickening or dilatation. Nonspecific persistent small bowel mesentery swirling (4:61). Appendix appears normal. Vascular/Lymphatic: No abdominal aorta or iliac aneurysm. Mild atherosclerotic plaque of the aorta and its branches. No abdominal, pelvic,  or inguinal lymphadenopathy. Reproductive: Left corpus luteum cyst-no further follow-up indicated. Interval development of a 3.7 x 3.1 cm right ovarian cystic lesion-no further follow-up indicated. Status post hysterectomy. Other: Nonspecific trace pelvic intraperitoneal simple free fluid. No intraperitoneal free gas. No  organized fluid collection. Musculoskeletal: Tiny fat containing supraumbilical ventral hernia (4:18). No suspicious lytic or blastic osseous lesions. No acute displaced fracture. Multilevel degenerative changes of the spine. Review of the MIP images confirms the above findings. IMPRESSION: 1. No pulmonary embolus. 2. Developing bilateral lower lobe and lingular patchy peribronchovascular ground-glass airspace opacities. Findings could represent developing infection/inflammation. 3. Interval development of nonspecific borderline enlarged mediastinal and interlobar lymph nodes likely reactive in etiology. Recommend attention on follow. 4. No acute intra-abdominal or intrapelvic abnormality. 5. Status post hysterectomy. 6.  Aortic Atherosclerosis (ICD10-I70.0). Electronically Signed   By: Tish Frederickson M.D.   On: 07/13/2023 01:40   CT ABDOMEN PELVIS W CONTRAST Result Date: 07/13/2023 CLINICAL DATA:  chest pain, elevated trops. eval PE. hx AVR; epigastric pain, emesis. hx chronic pancreatitis EXAM: CT ANGIOGRAPHY CHEST CT ABDOMEN AND PELVIS WITH CONTRAST TECHNIQUE: Multidetector CT imaging of the chest was performed using the standard protocol during bolus administration of intravenous contrast. Multiplanar CT image reconstructions and MIPs were obtained to evaluate the vascular anatomy. Multidetector CT imaging of the abdomen and pelvis was performed using the standard protocol during bolus administration of intravenous contrast. RADIATION DOSE REDUCTION: This exam was performed according to the departmental dose-optimization program which includes automated exposure control, adjustment of the mA and/or kV according to patient size and/or use of iterative reconstruction technique. CONTRAST:  OMNIPAQUE IOHEXOL 350 MG/ML SOLN COMPARISON:  CT abdomen pelvis 03/11/2023 CT angio chest abdomen pelvis 08/24/2022 FINDINGS: CTA CHEST FINDINGS Cardiovascular: Satisfactory opacification of the pulmonary arteries to  the segmental level. No evidence of pulmonary embolism. The main pulmonary artery is normal in caliber. Normal heart size. No significant pericardial effusion. The thoracic aorta is normal in caliber. No atherosclerotic plaque of the thoracic aorta. No coronary artery calcifications. Aortic valve replacement. Mediastinum/Nodes: Interval resolution of anterior mediastinal soft tissue density. Interval development of borderline enlarged mediastinal and inter lobar lymph nodes measuring up to 1 cm (2:36 737). No enlarged hilar or axillary lymph nodes. Thyroid gland, trachea, and esophagus demonstrate no significant findings. Lungs/Pleura: Developing bilateral lower lobe and lingular patchy peribronchovascular ground-glass airspace opacities. No pulmonary nodule. No pulmonary mass. No pleural effusion. No pneumothorax. Musculoskeletal: No chest wall abnormality. No suspicious lytic or blastic osseous lesions. No acute displaced fracture. Multilevel degenerative changes of the spine. Intact sternotomy wires. Review of the MIP images confirms the above findings. CT ABDOMEN and PELVIS FINDINGS Hepatobiliary: No focal liver abnormality. Status post cholecystectomy. No biliary dilatation. Pancreas: No focal lesion. Normal pancreatic contour. No surrounding inflammatory changes. No main pancreatic ductal dilatation. Spleen: Normal in size without focal abnormality. Adrenals/Urinary Tract: No adrenal nodule bilaterally. Bilateral kidneys enhance symmetrically. No hydronephrosis. No hydroureter. The urinary bladder is unremarkable. Stomach/Bowel: Stomach is within normal limits. No evidence of bowel wall thickening or dilatation. Nonspecific persistent small bowel mesentery swirling (4:61). Appendix appears normal. Vascular/Lymphatic: No abdominal aorta or iliac aneurysm. Mild atherosclerotic plaque of the aorta and its branches. No abdominal, pelvic, or inguinal lymphadenopathy. Reproductive: Left corpus luteum cyst-no further  follow-up indicated. Interval development of a 3.7 x 3.1 cm right ovarian cystic lesion-no further follow-up indicated. Status post hysterectomy. Other: Nonspecific trace pelvic intraperitoneal simple free fluid. No intraperitoneal free gas. No organized fluid collection. Musculoskeletal:  Tiny fat containing supraumbilical ventral hernia (4:18). No suspicious lytic or blastic osseous lesions. No acute displaced fracture. Multilevel degenerative changes of the spine. Review of the MIP images confirms the above findings. IMPRESSION: 1. No pulmonary embolus. 2. Developing bilateral lower lobe and lingular patchy peribronchovascular ground-glass airspace opacities. Findings could represent developing infection/inflammation. 3. Interval development of nonspecific borderline enlarged mediastinal and interlobar lymph nodes likely reactive in etiology. Recommend attention on follow. 4. No acute intra-abdominal or intrapelvic abnormality. 5. Status post hysterectomy. 6.  Aortic Atherosclerosis (ICD10-I70.0). Electronically Signed   By: Tish Frederickson M.D.   On: 07/13/2023 01:40   DG Chest 2 View Result Date: 07/12/2023 CLINICAL DATA:  Chest pain EXAM: CHEST - 2 VIEW COMPARISON:  03/20/2023 FINDINGS: Prior median sternotomy and valve replacement. Heart is borderline in size. Lungs clear. No effusions or edema. No acute bony abnormality IMPRESSION: No active cardiopulmonary disease. Electronically Signed   By: Charlett Nose M.D.   On: 07/12/2023 20:10    Family Communication: Discussed with patient, she understand and agree. All questions answereed.   Disposition: Status is: Inpatient Remains inpatient appropriate because: Sub therapeutic INR  Planned Discharge Destination: Home     Time spent: 37 minutes  Author: Marcelino Duster, MD 07/14/2023 3:11 PM Secure chat 7am to 7pm For on call review [email or web address].

## 2023-07-14 NOTE — Plan of Care (Signed)

## 2023-07-14 NOTE — Progress Notes (Signed)
ANTICOAGULATION CONSULT NOTE  Pharmacy Consult for lovenox bridge and warfarin  Indication:Aortic Mechanical Valve   (warfarin PTA)  Allergies  Allergen Reactions   Latex Anaphylaxis, Hives and Itching    Other reaction(s): Other (See Comments) SKIN BURNING & PEELING    Nsaids Other (See Comments)    Serve ulcers; stomach aches, GERD.  Other Reaction(s): Other (See Comments)   Tomato Anaphylaxis, Hives and Itching   Gabapentin Palpitations    Has heart condition; also makes jittery   Metformin     Other Reaction(s): Abdominal Pain  Causes pancreatitis to worsen   Lisinopril     Other Reaction(s): Angioedema   Tramadol Itching and Hives   Duloxetine Anxiety    Makes her jittery    Patient Measurements: Height: 5\' 7"  (170.2 cm) Weight: 123.4 kg (272 lb) IBW/kg (Calculated) : 61.6 Heparin Dosing Weight: 90.9 kg  Vital Signs: Temp: 97.8 F (36.6 C) (12/28 0838) Temp Source: Oral (12/28 0411) BP: 128/71 (12/28 0838) Pulse Rate: 61 (12/28 0838)  Labs: Recent Labs    07/12/23 2038 07/13/23 0028 07/13/23 0503 07/13/23 0934 07/14/23 0657  HGB 14.9  --   --   --  12.0  HCT 44.6  --   --   --  36.1  PLT 248  --   --   --  215  APTT  --   --  119*  --   --   LABPROT 14.0  --  16.0*  --  14.0  INR 1.1  --  1.3*  --  1.1  HEPARINUNFRC  --   --   --  0.12*  --   CREATININE 0.83  --   --   --  0.71  TROPONINIHS 117* 114*  --   --   --     Estimated Creatinine Clearance: 126.1 mL/min (by C-G formula based on SCr of 0.71 mg/dL).   Medical History: Past Medical History:  Diagnosis Date   Arthritis    Asthma    Chronic pain disorder 03/19/2018   GERD (gastroesophageal reflux disease)    Headache    Heart murmur    History of trichomoniasis 03/19/2018   Hypertension    Intractable nausea and vomiting    Pancreatitis    PID (pelvic inflammatory disease) 02/14/2018   Uterine leiomyoma 03/19/2018    Assessment: Pt is a 42 yo female presenting to ED abdominal  and chest pain found with elevated troponin I level.  Pt with hx of aortic valve replacement on warfarin at home.  INR at admission 1.1. INR likely below goal given 3-day history of abdominal pain, 1-day history of vomiting, and inability to keep her meds down. Pharmacy was consulted to re-start home warfarin.  Warfarin home dose: 7.5 mg T/Th and 3.25 mg all other days Baseline INR: 1.3    Warfarin assessment: Date INR Warfarin Dose  12/27 1.3 5 mg (ordered)  12/28 1.1             Goal of Therapy:  INR goal 1.5-2.0- per chart review from anti-coag visit 07/09/23 Monitor platelets by anticoagulation protocol: Yes   Lovenox Plan:  Will order therapeutic lovenox 1 mg/kg (120 mg) q12h to bridge until INR therapeutic  (per [facility] anticoagulation clinic visit note 07/09/23:  INR goal 1.5 to 2.0) Continue to monitor CBC daily  Warfarin Plan:  INR this morning is subtherapeutic Will give 5mg  x 1 again  today (~1.5 times home dose) Note DDI with azithromycin Continue to monitor INR  daily   Bari Mantis PharmD Clinical Pharmacist 07/14/2023

## 2023-07-14 NOTE — Progress Notes (Signed)
SUBJECTIVE: Patient denies any chest pain or shortness of breat   Vitals:   07/13/23 2000 07/13/23 2127 07/14/23 0411 07/14/23 0838  BP: (!) 153/90 (!) 141/92 (!) 145/87 128/71  Pulse: 65 64 (!) 54 61  Resp: 11 18 18 18   Temp: 98.7 F (37.1 C) 98.7 F (37.1 C) 98.4 F (36.9 C) 97.8 F (36.6 C)  TempSrc: Oral  Oral   SpO2: 98% 100% 100% 100%  Weight:      Height:        Intake/Output Summary (Last 24 hours) at 07/14/2023 1253 Last data filed at 07/13/2023 2338 Gross per 24 hour  Intake 131.55 ml  Output --  Net 131.55 ml    LABS: Basic Metabolic Panel: Recent Labs    07/12/23 2038 07/13/23 0028 07/14/23 0657  NA 137  --  139  K 3.1*  --  3.3*  CL 98  --  105  CO2 26  --  27  GLUCOSE 97  --  121*  BUN 8  --  7  CREATININE 0.83  --  0.71  CALCIUM 9.1  --  8.3*  MG  --  1.3*  --    Liver Function Tests: No results for input(s): "AST", "ALT", "ALKPHOS", "BILITOT", "PROT", "ALBUMIN" in the last 72 hours. Recent Labs    07/12/23 2038  LIPASE 21   CBC: Recent Labs    07/12/23 2038 07/14/23 0657  WBC 6.0 6.0  HGB 14.9 12.0  HCT 44.6 36.1  MCV 92.0 90.5  PLT 248 215   Cardiac Enzymes: No results for input(s): "CKTOTAL", "CKMB", "CKMBINDEX", "TROPONINI" in the last 72 hours. BNP: Invalid input(s): "POCBNP" D-Dimer: No results for input(s): "DDIMER" in the last 72 hours. Hemoglobin A1C: No results for input(s): "HGBA1C" in the last 72 hours. Fasting Lipid Panel: No results for input(s): "CHOL", "HDL", "LDLCALC", "TRIG", "CHOLHDL", "LDLDIRECT" in the last 72 hours. Thyroid Function Tests: No results for input(s): "TSH", "T4TOTAL", "T3FREE", "THYROIDAB" in the last 72 hours.  Invalid input(s): "FREET3" Anemia Panel: No results for input(s): "VITAMINB12", "FOLATE", "FERRITIN", "TIBC", "IRON", "RETICCTPCT" in the last 72 hours.   PHYSICAL EXAM General: Well developed, well nourished, in no acute distress HEENT:  Normocephalic and atramatic Neck:  No  JVD.  Lungs: Clear bilaterally to auscultation and percussion. Heart: HRRR . Normal S1 and S2 without gallops or murmurs.  Abdomen: Bowel sounds are positive, abdomen soft and non-tender  Msk:  Back normal, normal gait. Normal strength and tone for age. Extremities: No clubbing, cyanosis or edema.   Neuro: Alert and oriented X 3. Psych:  Good affect, responds appropriately  TELEMETRY: Normal sinus rhythm  ASSESSMENT AND PLAN: Status post aortic aneurysm and aortic valve replacement with history of HFpEF presented with mildly elevated troponin most due to demand ischemia.  Patient also has history of chronic ALCOHOLIC pancreatitis and presented with abdominal pain.  No cardiac intervention necessary during this admission and can be followed as an outpatient [facility] cardiology.   ICD-10-CM   1. Subtherapeutic international normalized ratio (INR)  R79.1     2. Elevated troponin  R79.89     3. 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

## 2023-07-15 DIAGNOSIS — I214 Non-ST elevation (NSTEMI) myocardial infarction: Secondary | ICD-10-CM | POA: Diagnosis not present

## 2023-07-15 DIAGNOSIS — R791 Abnormal coagulation profile: Secondary | ICD-10-CM | POA: Diagnosis not present

## 2023-07-15 DIAGNOSIS — I5030 Unspecified diastolic (congestive) heart failure: Secondary | ICD-10-CM | POA: Diagnosis not present

## 2023-07-15 DIAGNOSIS — Z952 Presence of prosthetic heart valve: Secondary | ICD-10-CM | POA: Diagnosis not present

## 2023-07-15 DIAGNOSIS — J189 Pneumonia, unspecified organism: Secondary | ICD-10-CM | POA: Diagnosis not present

## 2023-07-15 DIAGNOSIS — R7989 Other specified abnormal findings of blood chemistry: Secondary | ICD-10-CM | POA: Diagnosis not present

## 2023-07-15 LAB — CBC
HCT: 35.6 % — ABNORMAL LOW (ref 36.0–46.0)
Hemoglobin: 11.7 g/dL — ABNORMAL LOW (ref 12.0–15.0)
MCH: 29.8 pg (ref 26.0–34.0)
MCHC: 32.9 g/dL (ref 30.0–36.0)
MCV: 90.8 fL (ref 80.0–100.0)
Platelets: 204 10*3/uL (ref 150–400)
RBC: 3.92 MIL/uL (ref 3.87–5.11)
RDW: 14.1 % (ref 11.5–15.5)
WBC: 5.4 10*3/uL (ref 4.0–10.5)
nRBC: 0 % (ref 0.0–0.2)

## 2023-07-15 LAB — URINALYSIS, ROUTINE W REFLEX MICROSCOPIC
Bilirubin Urine: NEGATIVE
Glucose, UA: NEGATIVE mg/dL
Hgb urine dipstick: NEGATIVE
Ketones, ur: NEGATIVE mg/dL
Leukocytes,Ua: NEGATIVE
Nitrite: NEGATIVE
Protein, ur: NEGATIVE mg/dL
Specific Gravity, Urine: 1.004 — ABNORMAL LOW (ref 1.005–1.030)
pH: 7 (ref 5.0–8.0)

## 2023-07-15 LAB — PROTIME-INR
INR: 1 (ref 0.8–1.2)
Prothrombin Time: 13.8 s (ref 11.4–15.2)

## 2023-07-15 LAB — MAGNESIUM: Magnesium: 1.5 mg/dL — ABNORMAL LOW (ref 1.7–2.4)

## 2023-07-15 MED ORDER — SENNA 8.6 MG PO TABS
2.0000 | ORAL_TABLET | Freq: Every day | ORAL | Status: DC
  Administered 2023-07-15 – 2023-07-17 (×3): 17.2 mg via ORAL
  Filled 2023-07-15 (×3): qty 2

## 2023-07-15 MED ORDER — TRAZODONE HCL 100 MG PO TABS
100.0000 mg | ORAL_TABLET | Freq: Once | ORAL | Status: AC
  Administered 2023-07-15: 100 mg via ORAL
  Filled 2023-07-15: qty 1

## 2023-07-15 MED ORDER — WARFARIN SODIUM 7.5 MG PO TABS
7.5000 mg | ORAL_TABLET | Freq: Once | ORAL | Status: AC
  Administered 2023-07-15: 7.5 mg via ORAL
  Filled 2023-07-15: qty 1

## 2023-07-15 MED ORDER — HYDROMORPHONE HCL 1 MG/ML IJ SOLN
1.0000 mg | Freq: Four times a day (QID) | INTRAMUSCULAR | Status: DC | PRN
  Administered 2023-07-15 – 2023-07-17 (×7): 1 mg via INTRAVENOUS
  Filled 2023-07-15 (×7): qty 1

## 2023-07-15 MED ORDER — DOCUSATE SODIUM 100 MG PO CAPS
100.0000 mg | ORAL_CAPSULE | Freq: Two times a day (BID) | ORAL | Status: DC
  Administered 2023-07-15 – 2023-07-18 (×6): 100 mg via ORAL
  Filled 2023-07-15 (×7): qty 1

## 2023-07-15 MED ORDER — MAGNESIUM OXIDE -MG SUPPLEMENT 400 (240 MG) MG PO TABS
400.0000 mg | ORAL_TABLET | Freq: Every day | ORAL | Status: DC
  Administered 2023-07-15 – 2023-07-18 (×4): 400 mg via ORAL
  Filled 2023-07-15 (×4): qty 1

## 2023-07-15 MED ORDER — POTASSIUM CHLORIDE CRYS ER 20 MEQ PO TBCR
40.0000 meq | EXTENDED_RELEASE_TABLET | Freq: Once | ORAL | Status: DC
  Filled 2023-07-15: qty 2

## 2023-07-15 NOTE — Plan of Care (Signed)

## 2023-07-15 NOTE — Progress Notes (Signed)
SUBJECTIVE: Patient is feeling much better denies any chest pain or shortness of breath.   Vitals:   07/14/23 1756 07/14/23 2109 07/15/23 0216 07/15/23 0920  BP: (!) 161/96 (!) 144/72 (!) 159/92 (!) 165/108  Pulse: (!) 54 68 63 60  Resp:  20 18 18   Temp: 98.6 F (37 C) 99.2 F (37.3 C) 98.9 F (37.2 C) 98.2 F (36.8 C)  TempSrc: Oral  Oral Oral  SpO2: 98% 100% 100% 100%  Weight:      Height:        Intake/Output Summary (Last 24 hours) at 07/15/2023 1248 Last data filed at 07/15/2023 0744 Gross per 24 hour  Intake 568.48 ml  Output --  Net 568.48 ml    LABS: Basic Metabolic Panel: Recent Labs    07/12/23 2038 07/13/23 0028 07/14/23 0657  NA 137  --  139  K 3.1*  --  3.3*  CL 98  --  105  CO2 26  --  27  GLUCOSE 97  --  121*  BUN 8  --  7  CREATININE 0.83  --  0.71  CALCIUM 9.1  --  8.3*  MG  --  1.3*  --    Liver Function Tests: No results for input(s): "AST", "ALT", "ALKPHOS", "BILITOT", "PROT", "ALBUMIN" in the last 72 hours. Recent Labs    07/12/23 2038  LIPASE 21   CBC: Recent Labs    07/14/23 0657 07/15/23 0551  WBC 6.0 5.4  HGB 12.0 11.7*  HCT 36.1 35.6*  MCV 90.5 90.8  PLT 215 204   Cardiac Enzymes: No results for input(s): "CKTOTAL", "CKMB", "CKMBINDEX", "TROPONINI" in the last 72 hours. BNP: Invalid input(s): "POCBNP" D-Dimer: No results for input(s): "DDIMER" in the last 72 hours. Hemoglobin A1C: No results for input(s): "HGBA1C" in the last 72 hours. Fasting Lipid Panel: No results for input(s): "CHOL", "HDL", "LDLCALC", "TRIG", "CHOLHDL", "LDLDIRECT" in the last 72 hours. Thyroid Function Tests: No results for input(s): "TSH", "T4TOTAL", "T3FREE", "THYROIDAB" in the last 72 hours.  Invalid input(s): "FREET3" Anemia Panel: No results for input(s): "VITAMINB12", "FOLATE", "FERRITIN", "TIBC", "IRON", "RETICCTPCT" in the last 72 hours.   PHYSICAL EXAM General: Well developed, well nourished, in no acute distress HEENT:   Normocephalic and atramatic Neck:  No JVD.  Lungs: Clear bilaterally to auscultation and percussion. Heart: HRRR . Normal S1 and S2 without gallops or murmurs.  Abdomen: Bowel sounds are positive, abdomen soft and non-tender  Msk:  Back normal, normal gait. Normal strength and tone for age. Extremities: No clubbing, cyanosis or edema.   Neuro: Alert and oriented X 3. Psych:  Good affect, responds appropriately  TELEMETRY: Sinus rhythm  ASSESSMENT AND PLAN: Status post aortic aneurysm and aortic valve replacement with history of HFpEF presented with mildly elevated troponin most due to demand ischemia. Patient also has history of chronic ALCOHOLIC pancreatitis and presented with abdominal pain. No cardiac intervention necessary during this admission and can be followed as an outpatient [facility] cardiology.    ICD-10-CM   1. Subtherapeutic international normalized ratio (INR)  R79.1     2. Elevated troponin  R79.89     3. 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

## 2023-07-15 NOTE — Progress Notes (Signed)
Progress Note   Patient: Sarah Sosa XBJ:[id number] DOB: 01-Nov-1980 DOA: 07/12/2023     2 DOS: the patient was seen and examined on 07/15/2023   Brief hospital course: Sarah Sosa is a 42 y.o. female with medical history significant for Chronic diastolic CHF, nonrheumatic aortic insufficiency s/p mechanical AVR at [facility] 03/2022 on Coumadin,, HTN, ascending aortic aneurysm, asthma, hypertension, morbid obesity chronic pain on suboxone, methadone and oxycodone from the [facility] pain clinic, peripheral neuropathy and alcohol induced chronic pancreatitis, who presents to the ED with a 3-day history of abdominal pain typical for her pancreatitis and a 1 day history of vomiting.    Patient is admitted to the hospitalist service for further management evaluation of community-acquired pneumonia, acute on chronic alcoholic pancreatitis, elevated troponin, electrolyte abnormalities, subtherapeutic INR.  Assessment and Plan: * Elevated troponin Type II MI In the setting of intractable pain with vomiting as well as hypertensive urgency Troponin remain flat 117-114. Cardiology evaluation appreciated. Outpatient [facility] cardiology follow-up. Continue Coumadin with lovenox bridging per pharmacy protocol. Echocardiogram pending.  CAP (community acquired pneumonia) Continue Rocephin and azithromycin Continue antitussives  Intractable nausea and vomiting Acute on chronic alcoholic pancreatitis Patient with pain typical for her to pancreatitis, she is unable to tolerate oral meds. Clear liquid diets advanced to full liquids today. Continue pain control, antiemetics  Hypomagnesemia Hypokalemia Potassium oral supplements ordered. Mag level added to prior labs.  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  Goal INR 2-3 (patient reports her goal is 1.5-2 per outpatient heme-onc) Continue Lovenox bridging and daily dosing of Coumadin per  pharmacy.  Hypertensive urgency BP better now with pain controlled IV hydralazine as needed for elevated blood pressures Continue Lasix, spironolactone.  (HFpEF) heart failure with preserved ejection fraction (HCC) Clinically euvolemic. Continue home dose spironolactone and Lasix  History of heavy alcohol consumption In remission. Cessation of alcohol advised.  Chronic use of opiate drug for therapeutic purpose Oral home pain meds will be restarted.  Obesity, Class III, BMI 40-49.9 (morbid obesity) (HCC) Complicating factor to overall prognosis and care Diet, exercise and weight reduction advised.    Out of bed to chair. Incentive spirometry. Nursing supportive care. Fall, aspiration precautions. DVT prophylaxis   Code Status: Full Code  Subjective: Patient is seen and examined today morning.  Nausea better.  She wishes to advance diet. Sitting in chair, denies any complaints.  Physical Exam: Vitals:   07/14/23 1756 07/14/23 2109 07/15/23 0216 07/15/23 0920  BP: (!) 161/96 (!) 144/72 (!) 159/92 (!) 165/108  Pulse: (!) 54 68 63 60  Resp:  20 18 18   Temp: 98.6 F (37 C) 99.2 F (37.3 C) 98.9 F (37.2 C) 98.2 F (36.8 C)  TempSrc: Oral  Oral Oral  SpO2: 98% 100% 100% 100%  Weight:      Height:        General - Elderly obese African-American female, mild distress due to pain HEENT - PERRLA, EOMI, atraumatic head, non tender sinuses. Lung -distant breath sounds, basal rales, rhonchi, no wheezes. Heart - S1, S2 heard, no murmurs, rubs, trace pedal edema. Abdomen - Soft, non tender, obese, bowel sounds good Neuro - Alert, awake and oriented x 3, non focal exam. Skin - Warm and dry.  Data Reviewed:      Latest Ref Rng & Units 07/15/2023    5:51 AM 07/14/2023    6:57 AM 07/12/2023    8:38 PM  CBC  WBC 4.0 -  10.5 K/uL 5.4  6.0  6.0   Hemoglobin 12.0 - 15.0 g/dL 16.6  06.3  01.6   Hematocrit 36.0 - 46.0 % 35.6  36.1  44.6   Platelets 150 - 400 K/uL 204  215   248       Latest Ref Rng & Units 07/14/2023    6:57 AM 07/12/2023    8:38 PM 03/20/2023    1:48 AM  BMP  Glucose 70 - 99 mg/dL 010  97  932   BUN 6 - 20 mg/dL 7  8  11    Creatinine 0.44 - 1.00 mg/dL 3.55  7.32  2.02   Sodium 135 - 145 mmol/L 139  137  138   Potassium 3.5 - 5.1 mmol/L 3.3  3.1  3.7   Chloride 98 - 111 mmol/L 105  98  97   CO2 22 - 32 mmol/L 27  26  33   Calcium 8.9 - 10.3 mg/dL 8.3  9.1  8.7    No results found.   Family Communication: Discussed with patient, she understand and agree. All questions answereed.   Disposition: Status is: Inpatient Remains inpatient appropriate because: Sub therapeutic INR, advance diet  Planned Discharge Destination: Home     Time spent: 38 minutes  Author: Marcelino Duster, MD 07/15/2023 3:54 PM Secure chat 7am to 7pm For on call review [email or web address].

## 2023-07-15 NOTE — Progress Notes (Signed)
ANTICOAGULATION CONSULT NOTE  Pharmacy Consult for lovenox bridge and warfarin  Indication:Aortic Mechanical Valve   (warfarin PTA)  Allergies  Allergen Reactions   Latex Anaphylaxis, Hives and Itching    Other reaction(s): Other (See Comments) SKIN BURNING & PEELING    Nsaids Other (See Comments)    Serve ulcers; stomach aches, GERD.  Other Reaction(s): Other (See Comments)   Tomato Anaphylaxis, Hives and Itching   Gabapentin Palpitations    Has heart condition; also makes jittery   Metformin     Other Reaction(s): Abdominal Pain  Causes pancreatitis to worsen   Lisinopril     Other Reaction(s): Angioedema   Tramadol Itching and Hives   Duloxetine Anxiety    Makes her jittery    Patient Measurements: Height: 5\' 7"  (170.2 cm) Weight: 123.4 kg (272 lb) IBW/kg (Calculated) : 61.6 Heparin Dosing Weight: 90.9 kg  Vital Signs: Temp: 98.9 F (37.2 C) (12/29 0216) Temp Source: Oral (12/29 0216) BP: 159/92 (12/29 0216) Pulse Rate: 63 (12/29 0216)  Labs: Recent Labs    07/12/23 2038 07/13/23 0028 07/13/23 0503 07/13/23 0934 07/14/23 0657 07/15/23 0551  HGB 14.9  --   --   --  12.0 11.7*  HCT 44.6  --   --   --  36.1 35.6*  PLT 248  --   --   --  215 204  APTT  --   --  119*  --   --   --   LABPROT 14.0  --  16.0*  --  14.0 13.8  INR 1.1  --  1.3*  --  1.1 1.0  HEPARINUNFRC  --   --   --  0.12*  --   --   CREATININE 0.83  --   --   --  0.71  --   TROPONINIHS 117* 114*  --   --   --   --     Estimated Creatinine Clearance: 126.1 mL/min (by C-G formula based on SCr of 0.71 mg/dL).   Medical History: Past Medical History:  Diagnosis Date   Arthritis    Asthma    Chronic pain disorder 03/19/2018   GERD (gastroesophageal reflux disease)    Headache    Heart murmur    History of trichomoniasis 03/19/2018   Hypertension    Intractable nausea and vomiting    Pancreatitis    PID (pelvic inflammatory disease) 02/14/2018   Uterine leiomyoma 03/19/2018     Assessment: Pt is a 42 yo female presenting to ED abdominal and chest pain found with elevated troponin I level.  Pt with hx of aortic valve replacement on warfarin at home.  INR at admission 1.1. INR likely below goal given 3-day history of abdominal pain, 1-day history of vomiting, and inability to keep her meds down. Pharmacy was consulted to re-start home warfarin.  Warfarin home dose: 7.5 mg T/Th and 3.25 mg all other days Baseline INR: 1.3    Warfarin assessment: Date INR Warfarin Dose  12/27 1.3 5 mg (ordered)  12/28 1.1 5 mg  12/29 1.0         Goal of Therapy:  INR goal 1.5-2.0- per chart review from anti-coag visit 07/09/23 Monitor platelets by anticoagulation protocol: Yes   Lovenox Plan:  Will order therapeutic lovenox 1 mg/kg (120 mg) q12h to bridge until INR therapeutic  (per [facility] anticoagulation clinic visit note 07/09/23:  INR goal 1.5 to 2.0) Continue to monitor CBC daily  Warfarin Plan:  INR  subtherapeutic Will give  7.5 mg x 1  today  Note DDI with azithromycin Continue to monitor INR daily   Bari Mantis PharmD Clinical Pharmacist 07/15/2023

## 2023-07-15 NOTE — TOC Initial Note (Signed)
Transition of Care [facility]) - Initial/Assessment Note    Patient Details  Name: Sarah Sosa MRN: [id number] Date of Birth: May 16, 1981  Transition [facility]) CM/SW Contact:    Liliana Cline, LCSW Phone Number: 07/15/2023, 2:31 PM  Clinical Narrative:                 CSW spoke with patient at bedside for readmission risk assessment.  Patient lives at home with her father and children. Father to transport home at DC. Patient drives.  PCP is Dr. Sela Hua at [address] [place]). Pharmacy is [facility]. Patient has a rollator and blood pressure cuff at home.  Patient denies TOC needs at this time.  Expected Discharge Plan: Home/Self Care Barriers to Discharge: Continued Medical Work up   Patient Goals and CMS Choice Patient states their goals for this hospitalization and ongoing recovery are:: to return home CMS Medicare.gov Compare Post Acute Care list provided to:: Patient Choice offered to / list presented to : Patient      Expected Discharge Plan and Services       Living arrangements for the past 2 months: Single Family Home                                      Prior Living Arrangements/Services Living arrangements for the past 2 months: Single Family Home Lives with:: Parents, Minor Children Patient language and need for interpreter reviewed:: Yes Do you feel safe going back to the place where you live?: Yes      Need for Family Participation in Patient Care: Yes (Comment) Care giver support system in place?: Yes (comment) Current home services: DME Criminal Activity/Legal Involvement Pertinent to Current Situation/Hospitalization: No - Comment as needed  Activities of Daily Living   ADL Screening (condition at time of admission) Independently performs ADLs?: Yes (appropriate for developmental age) Is the patient deaf or have difficulty hearing?: No Does the patient have difficulty seeing, even when wearing glasses/contacts?:  No Does the patient have difficulty concentrating, remembering, or making decisions?: No  Permission Sought/Granted Permission sought to share information with : Facility Industrial/product designer granted to share information with : Yes, Verbal Permission Granted     Permission granted to share info w AGENCY: as needed        Emotional Assessment       Orientation: : Oriented to Self, Oriented to Place, Oriented to  Time, Oriented to Situation Alcohol / Substance Use: Not Applicable Psych Involvement: No (comment)  Admission diagnosis:  Elevated troponin [R79.89] Subtherapeutic international normalized ratio (INR) [R79.1] Community acquired pneumonia of left lower lobe of lung [J18.9] Patient Active Problem List   Diagnosis Date Noted   Elevated troponin 07/13/2023   Subtherapeutic international normalized ratio (INR) 07/13/2023   Chronic pancreatitis (HCC) 07/13/2023   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 at [facility] 07/13/2023   CAP (community acquired pneumonia) 07/13/2023   Opioid overdose, accidental or unintentional, initial encounter (HCC) 03/20/2023   Vomiting 03/20/2023   Obesity, Class III, BMI 40-49.9 (morbid obesity) (HCC) 03/20/2023   History of pancreatitis 03/20/2023   Acute respiratory failure with hypoxia (HCC) 03/20/2023   Chronic use of opiate drug for therapeutic purpose 06/02/2022   S/P ascending aortic replacement 04/12/2022   Hypomagnesemia 11/23/2021   Essential hypertension 11/20/2021   GERD without esophagitis 11/20/2021   (HFpEF) heart failure with preserved ejection  fraction (HCC) 11/20/2021   Peripheral neuropathy 11/20/2021   Asthma, chronic 11/20/2021   Acute alcoholic pancreatitis 11/20/2021   Alcohol abuse 11/20/2021   Acute pancreatitis 11/20/2021   History of heavy alcohol consumption 05/22/2021   Recurrent pancreatitis 06/04/2020   Intractable nausea and vomiting    Marijuana use     Hypertensive urgency 04/03/2020   Hypokalemia 04/03/2020   Tobacco abuse 04/03/2020   History of substance abuse (HCC) 04/03/2020   Ileus (HCC) 04/21/2018   Postoperative ileus (HCC) 04/20/2018   Postoperative state 04/15/2018   Uterine leiomyoma 03/19/2018   Menorrhagia with regular cycle 03/19/2018   Chronic pain disorder 03/19/2018   Pelvic pain 03/19/2018   Primary hypertension 03/19/2018   PCP:  Clinic, [facility]:   [facility] [place], [place] - [address] [place] [address] Phone: [phone] Fax: [phone]  [facility] [address] [place] - [address] [place] [address] Phone: [phone] Fax: [phone]     Social Drivers of Health (SDOH) Social History: SDOH Screenings   Food Insecurity: No Food Insecurity (07/13/2023)  Housing: High Risk (07/13/2023)  Transportation Needs: No Transportation Needs (07/13/2023)  Utilities: Not At Risk (07/13/2023)  Financial Resource Strain: Medium Risk (08/10/2021)   Received from [facility], [facility]  Social Connections: Moderately Isolated (06/05/2021)   Received from [facility], [facility]  Stress: Stress Concern Present (06/05/2021)   Received from [facility], [facility]  Tobacco Use: High Risk (07/12/2023)   SDOH Interventions:     Readmission Risk Interventions    07/15/2023    2:29 PM 03/21/2023    9:27 AM 11/25/2021    4:37 PM  Readmission Risk Prevention Plan  Transportation Screening Complete Complete Complete  PCP or Specialist Appt within 3-5 Days Complete    HRI or Home Care Consult Complete    Social Work Consult for Recovery Care Planning/Counseling Complete    Palliative Care Screening Not Applicable    Medication Review Oceanographer) Complete Complete Complete  PCP or Specialist appointment within 3-5  days of discharge  Complete Complete  HRI or Home Care Consult  Complete Complete  SW Recovery Care/Counseling Consult  Complete Complete  Palliative Care Screening  Not Applicable Not Applicable  Skilled Nursing Facility  Not Applicable Not Applicable

## 2023-07-16 ENCOUNTER — Inpatient Hospital Stay
Admit: 2023-07-16 | Discharge: 2023-07-16 | Disposition: A | Discharge status: Home / Self Care | Payer: Medicaid Other | Attending: Internal Medicine | Admitting: Internal Medicine

## 2023-07-16 DIAGNOSIS — R7989 Other specified abnormal findings of blood chemistry: Secondary | ICD-10-CM | POA: Diagnosis not present

## 2023-07-16 DIAGNOSIS — R791 Abnormal coagulation profile: Secondary | ICD-10-CM | POA: Diagnosis not present

## 2023-07-16 DIAGNOSIS — J189 Pneumonia, unspecified organism: Secondary | ICD-10-CM | POA: Diagnosis not present

## 2023-07-16 DIAGNOSIS — Z952 Presence of prosthetic heart valve: Secondary | ICD-10-CM | POA: Diagnosis not present

## 2023-07-16 LAB — PROTIME-INR
INR: 1 (ref 0.8–1.2)
Prothrombin Time: 13.9 s (ref 11.4–15.2)

## 2023-07-16 LAB — BASIC METABOLIC PANEL
Anion gap: 9 (ref 5–15)
BUN: 6 mg/dL (ref 6–20)
CO2: 24 mmol/L (ref 22–32)
Calcium: 8.4 mg/dL — ABNORMAL LOW (ref 8.9–10.3)
Chloride: 99 mmol/L (ref 98–111)
Creatinine, Ser: 0.74 mg/dL (ref 0.44–1.00)
GFR, Estimated: >60 mL/min (ref >60)
Glucose, Bld: 118 mg/dL — ABNORMAL HIGH (ref 70–99)
Potassium: 3.1 mmol/L — ABNORMAL LOW (ref 3.5–5.1)
Sodium: 137 mmol/L (ref 135–145)

## 2023-07-16 LAB — ECHOCARDIOGRAM COMPLETE
AR max vel: 1.59 cm2
AV Area VTI: 1.61 cm2
AV Area mean vel: 1.44 cm2
AV Mean grad: 24.7 mm[Hg]
AV Peak grad: 45 mm[Hg]
Ao pk vel: 3.35 m/s
Area-P 1/2: 2.48 cm2
Height: 67 in
MV VTI: 2.39 cm2
S' Lateral: 2.9 cm
Weight: 4352 [oz_av]

## 2023-07-16 LAB — CBC
HCT: 35.9 % — ABNORMAL LOW (ref 36.0–46.0)
Hemoglobin: 12.1 g/dL (ref 12.0–15.0)
MCH: 29.7 pg (ref 26.0–34.0)
MCHC: 33.7 g/dL (ref 30.0–36.0)
MCV: 88 fL (ref 80.0–100.0)
Platelets: 232 10*3/uL (ref 150–400)
RBC: 4.08 MIL/uL (ref 3.87–5.11)
RDW: 13.5 % (ref 11.5–15.5)
WBC: 5.6 10*3/uL (ref 4.0–10.5)
nRBC: 0 % (ref 0.0–0.2)

## 2023-07-16 LAB — MAGNESIUM: Magnesium: 1.6 mg/dL — ABNORMAL LOW (ref 1.7–2.4)

## 2023-07-16 MED ORDER — POTASSIUM CHLORIDE 20 MEQ PO PACK
40.0000 meq | PACK | Freq: Every day | ORAL | Status: DC
  Administered 2023-07-16 – 2023-07-18 (×3): 40 meq via ORAL
  Filled 2023-07-16 (×3): qty 2

## 2023-07-16 MED ORDER — WARFARIN SODIUM 10 MG PO TABS
10.0000 mg | ORAL_TABLET | Freq: Once | ORAL | Status: AC
  Administered 2023-07-16: 10 mg via ORAL
  Filled 2023-07-16: qty 1

## 2023-07-16 MED ORDER — HYDRALAZINE HCL 20 MG/ML IJ SOLN
5.0000 mg | Freq: Once | INTRAMUSCULAR | Status: AC
Start: 2023-07-16 — End: 2023-07-16
  Administered 2023-07-16: 5 mg via INTRAVENOUS
  Filled 2023-07-16: qty 1

## 2023-07-16 MED ORDER — LOSARTAN POTASSIUM 50 MG PO TABS
50.0000 mg | ORAL_TABLET | Freq: Every day | ORAL | Status: DC
  Administered 2023-07-16 – 2023-07-18 (×3): 50 mg via ORAL
  Filled 2023-07-16 (×3): qty 1

## 2023-07-16 NOTE — Progress Notes (Signed)
[facility] CARDIOLOGY PROGRESS NOTE       Patient ID: Sarah Sosa MRN: [id number] DOB/AGE: March 18, 1981 42 y.o.  Admit date: 07/12/2023 Referring Physician Dr. Lindajo [facility]  Primary Cardiologist Dr. Launa [facility]) Reason for Consultation elevated troponin  HPI: Sarah Sosa is a 42 y.o. female  with a past medical history of severe aortic stenosis s/p mechanical AVR 04/10/2022, ascending aortic aneurysm s/p repair 03/2022, chronic diastolic heart failure, hypertension, hyperlipidemia, alcohol induced chronic pancreatitis, chronic pain who presented to the ED on 07/12/2023 for abdominal pain and vomiting.  Troponin checked and found to be mildly elevated and flat.  Cardiology was consulted for further evaluation.   Interval history: -Patient reports feeling ok this AM. BP elevated.  -States she still has SOB and chest discomfort when she coughs.  -Reports intermittent palpitations, HR controlled on tele with no evidence of arrhythmias.   Review of systems complete and found to be negative unless listed above    Past Medical History:  Diagnosis Date   Arthritis    Asthma    Chronic pain disorder 03/19/2018   GERD (gastroesophageal reflux disease)    Headache    Heart murmur    History of trichomoniasis 03/19/2018   Hypertension    Intractable nausea and vomiting    Pancreatitis    PID (pelvic inflammatory disease) 02/14/2018   Uterine leiomyoma 03/19/2018    Past Surgical History:  Procedure Laterality Date   ABDOMINAL HYSTERECTOMY     AORTIC VALVE REPLACEMENT  03/2022   CHOLECYSTECTOMY     DENTAL SURGERY     HYSTERECTOMY ABDOMINAL WITH SALPINGECTOMY Bilateral 04/15/2018   Procedure: HYSTERECTOMY ABDOMINAL WITH BILATERAL SALPINGECTOMY;  Surgeon: Hildred Laser, MD;  Location: [facility] ORS;  Service: Gynecology;  Laterality: Bilateral;   kenn surgery Bilateral    KNEE ARTHROSCOPY Left 2012, 2013    Medications Prior  to Admission  Medication Sig Dispense Refill Last Dose/Taking   amitriptyline (ELAVIL) 10 MG tablet Take 10 mg by mouth 3 (three) times daily as needed.   Taking As Needed   APPLE CIDER VINEGAR PO Take 1 capsule by mouth 3 (three) times daily.   Past Week   aspirin 81 MG chewable tablet Chew 81 mg by mouth daily.   Past Week   atorvastatin (LIPITOR) 40 MG tablet Take 40 mg by mouth daily.   Past Week   Buprenorphine HCl-Naloxone HCl 8-2 MG FILM Place 1 Film under the tongue 4 (four) times daily as needed.   Taking As Needed   metFORMIN (GLUCOPHAGE) 500 MG tablet Take 500 mg by mouth every morning.   Past Week   methadone (DOLOPHINE) 10 MG tablet Take 10 mg by mouth 4 (four) times daily.   Past Week   omega-3 acid ethyl esters (LOVAZA) 1 g capsule Take 1 g by mouth 2 (two) times daily.   Past Week   omeprazole (PRILOSEC) 40 MG capsule Take 40 mg by mouth daily.   Past Week   spironolactone (ALDACTONE) 25 MG tablet Take 25 mg by mouth daily.   Past Week   traZODone (DESYREL) 50 MG tablet Take 100 mg by mouth at bedtime.   Past Week   warfarin (COUMADIN) 7.5 MG tablet Take 3.25-7.5 mg by mouth daily. Take 3.25mg   M/W/F/S/Sun and T/Th 7.5mg  (1 tab)   07/11/2023   naloxone (NARCAN) nasal spray 4 mg/0.1 mL Place 1 spray into the nose once as needed (to reverse overdose).  Oxycodone HCl 10 MG TABS Take 1 tablet (10 mg total) by mouth every 6 (six) hours as needed. (Patient taking differently: Take 5-10 mg by mouth every 6 (six) hours as needed.) 10 tablet 0    Social History   Socioeconomic History   Marital status: Single    Spouse name: Not on file   Number of children: Not on file   Years of education: Not on file   Highest education level: Not on file  Occupational History   Not on file  Tobacco Use   Smoking status: Some Days    Current packs/day: 0.50    Average packs/day: 0.5 packs/day for 22.0 years (11.0 ttl pk-yrs)    Types: Cigarettes   Smokeless tobacco: Never  Vaping Use    Vaping status: Never Used  Substance and Sexual Activity   Alcohol use: Not Currently    Comment: occasional   Drug use: Yes    Types: Marijuana   Sexual activity: Not on file  Other Topics Concern   Not on file  Social History Narrative   Not on file   Social Drivers of Health   Financial Resource Strain: Medium Risk (08/10/2021)   Received from [facility], [facility]   Overall Financial Resource Strain (CARDIA)    Difficulty of Paying Living Expenses: Somewhat hard  Food Insecurity: No Food Insecurity (07/13/2023)   Hunger Vital Sign    Worried About Running Out of Food in the Last Year: Never true    Ran Out of Food in the Last Year: Never true  Transportation Needs: No Transportation Needs (07/13/2023)   PRAPARE - Administrator, Civil Service (Medical): No    Lack of Transportation (Non-Medical): No  Physical Activity: Not on file  Stress: Stress Concern Present (06/05/2021)   Received from [facility], [facility]   [employer] of Occupational Health - Occupational Stress Questionnaire    Feeling of Stress : Rather much  Social Connections: Moderately Isolated (06/05/2021)   Received from [facility], [facility]   Social Connection and Isolation Panel [NHANES]    Frequency of Communication with Friends and Family: More than three times a week    Frequency of Social Gatherings with Friends and Family: Twice a week    Attends Religious Services: Never    Database administrator or Organizations: No    Attends Engineer, structural: Patient declined    Marital Status: Living with partner  Intimate Partner Violence: Not At Risk (07/13/2023)   Humiliation, Afraid, Rape, and Kick questionnaire    Fear of Current or Ex-Partner: No    Emotionally Abused: No    Physically Abused: No    Sexually Abused: No    Family History  Problem Relation  Age of Onset   Fibroids Mother    Hypertension Mother    Arthritis Mother      Vitals:   07/15/23 0920 07/15/23 2007 07/16/23 0344 07/16/23 0847  BP: (!) 165/108 (!) 182/98 (!) 161/99 (!) 188/99  Pulse: 60 62 66 61  Resp: 18 18 18 18   Temp: 98.2 F (36.8 C) 98.6 F (37 C) 98.7 F (37.1 C) 98.3 F (36.8 C)  TempSrc: Oral Oral Oral Oral  SpO2: 100% 100% 98%   Weight:      Height:        PHYSICAL EXAM General: Chronically ill-appearing female, well nourished, in no acute distress. HEENT:  Normocephalic and atraumatic. Neck: No JVD.  Lungs: Normal respiratory effort on room air. Clear bilaterally to auscultation. No wheezes, crackles, rhonchi.  Heart: HRRR. Normal S1 and S2.  Systolic murmur.  Abdomen: Non-distended appearing.  Msk: Normal strength and tone for age. Extremities: Warm and well perfused. No clubbing, cyanosis.  No edema.  Neuro: Alert and oriented X 3. Psych: Answers questions appropriately.   Labs: Basic Metabolic Panel: Recent Labs    07/14/23 0657 07/15/23 1622 07/16/23 0407  NA 139  --  137  K 3.3*  --  3.1*  CL 105  --  99  CO2 27  --  24  GLUCOSE 121*  --  118*  BUN 7  --  6  CREATININE 0.71  --  0.74  CALCIUM 8.3*  --  8.4*  MG  --  1.5* 1.6*   Liver Function Tests: No results for input(s): "AST", "ALT", "ALKPHOS", "BILITOT", "PROT", "ALBUMIN" in the last 72 hours. No results for input(s): "LIPASE", "AMYLASE" in the last 72 hours.  CBC: Recent Labs    07/15/23 0551 07/16/23 0407  WBC 5.4 5.6  HGB 11.7* 12.1  HCT 35.6* 35.9*  MCV 90.8 88.0  PLT 204 232   Cardiac Enzymes: No results for input(s): "CKTOTAL", "CKMB", "CKMBINDEX", "TROPONINIHS" in the last 72 hours.  BNP: No results for input(s): "BNP" in the last 72 hours. D-Dimer: No results for input(s): "DDIMER" in the last 72 hours. Hemoglobin A1C: No results for input(s): "HGBA1C" in the last 72 hours. Fasting Lipid Panel: No results for input(s): "CHOL", "HDL", "LDLCALC",  "TRIG", "CHOLHDL", "LDLDIRECT" in the last 72 hours. Thyroid Function Tests: No results for input(s): "TSH", "T4TOTAL", "T3FREE", "THYROIDAB" in the last 72 hours.  Invalid input(s): "FREET3" Anemia Panel: No results for input(s): "VITAMINB12", "FOLATE", "FERRITIN", "TIBC", "IRON", "RETICCTPCT" in the last 72 hours.   Radiology: CT Angio Chest PE W and/or Wo Contrast Result Date: 07/13/2023 CLINICAL DATA:  chest pain, elevated trops. eval PE. hx AVR; epigastric pain, emesis. hx chronic pancreatitis EXAM: CT ANGIOGRAPHY CHEST CT ABDOMEN AND PELVIS WITH CONTRAST TECHNIQUE: Multidetector CT imaging of the chest was performed using the standard protocol during bolus administration of intravenous contrast. Multiplanar CT image reconstructions and MIPs were obtained to evaluate the vascular anatomy. Multidetector CT imaging of the abdomen and pelvis was performed using the standard protocol during bolus administration of intravenous contrast. RADIATION DOSE REDUCTION: This exam was performed according to the departmental dose-optimization program which includes automated exposure control, adjustment of the mA and/or kV according to patient size and/or use of iterative reconstruction technique. CONTRAST:  OMNIPAQUE IOHEXOL 350 MG/ML SOLN COMPARISON:  CT abdomen pelvis 03/11/2023 CT angio chest abdomen pelvis 08/24/2022 FINDINGS: CTA CHEST FINDINGS Cardiovascular: Satisfactory opacification of the pulmonary arteries to the segmental level. No evidence of pulmonary embolism. The main pulmonary artery is normal in caliber. Normal heart size. No significant pericardial effusion. The thoracic aorta is normal in caliber. No atherosclerotic plaque of the thoracic aorta. No coronary artery calcifications. Aortic valve replacement. Mediastinum/Nodes: Interval resolution of anterior mediastinal soft tissue density. Interval development of borderline enlarged mediastinal and inter lobar lymph nodes measuring up to 1  cm (2:36 737). No enlarged hilar or axillary lymph nodes. Thyroid gland, trachea, and esophagus demonstrate no significant findings. Lungs/Pleura: Developing bilateral lower lobe and lingular patchy peribronchovascular ground-glass airspace opacities. No pulmonary nodule. No pulmonary mass. No pleural effusion. No pneumothorax. Musculoskeletal: No chest wall abnormality. No suspicious lytic or blastic osseous lesions. No acute  displaced fracture. Multilevel degenerative changes of the spine. Intact sternotomy wires. Review of the MIP images confirms the above findings. CT ABDOMEN and PELVIS FINDINGS Hepatobiliary: No focal liver abnormality. Status post cholecystectomy. No biliary dilatation. Pancreas: No focal lesion. Normal pancreatic contour. No surrounding inflammatory changes. No main pancreatic ductal dilatation. Spleen: Normal in size without focal abnormality. Adrenals/Urinary Tract: No adrenal nodule bilaterally. Bilateral kidneys enhance symmetrically. No hydronephrosis. No hydroureter. The urinary bladder is unremarkable. Stomach/Bowel: Stomach is within normal limits. No evidence of bowel wall thickening or dilatation. Nonspecific persistent small bowel mesentery swirling (4:61). Appendix appears normal. Vascular/Lymphatic: No abdominal aorta or iliac aneurysm. Mild atherosclerotic plaque of the aorta and its branches. No abdominal, pelvic, or inguinal lymphadenopathy. Reproductive: Left corpus luteum cyst-no further follow-up indicated. Interval development of a 3.7 x 3.1 cm right ovarian cystic lesion-no further follow-up indicated. Status post hysterectomy. Other: Nonspecific trace pelvic intraperitoneal simple free fluid. No intraperitoneal free gas. No organized fluid collection. Musculoskeletal: Tiny fat containing supraumbilical ventral hernia (4:18). No suspicious lytic or blastic osseous lesions. No acute displaced fracture. Multilevel degenerative changes of the spine. Review of the MIP images  confirms the above findings. IMPRESSION: 1. No pulmonary embolus. 2. Developing bilateral lower lobe and lingular patchy peribronchovascular ground-glass airspace opacities. Findings could represent developing infection/inflammation. 3. Interval development of nonspecific borderline enlarged mediastinal and interlobar lymph nodes likely reactive in etiology. Recommend attention on follow. 4. No acute intra-abdominal or intrapelvic abnormality. 5. Status post hysterectomy. 6.  Aortic Atherosclerosis (ICD10-I70.0). Electronically Signed   By: Tish Frederickson M.D.   On: 07/13/2023 01:40   CT ABDOMEN PELVIS W CONTRAST Result Date: 07/13/2023 CLINICAL DATA:  chest pain, elevated trops. eval PE. hx AVR; epigastric pain, emesis. hx chronic pancreatitis EXAM: CT ANGIOGRAPHY CHEST CT ABDOMEN AND PELVIS WITH CONTRAST TECHNIQUE: Multidetector CT imaging of the chest was performed using the standard protocol during bolus administration of intravenous contrast. Multiplanar CT image reconstructions and MIPs were obtained to evaluate the vascular anatomy. Multidetector CT imaging of the abdomen and pelvis was performed using the standard protocol during bolus administration of intravenous contrast. RADIATION DOSE REDUCTION: This exam was performed according to the departmental dose-optimization program which includes automated exposure control, adjustment of the mA and/or kV according to patient size and/or use of iterative reconstruction technique. CONTRAST:  OMNIPAQUE IOHEXOL 350 MG/ML SOLN COMPARISON:  CT abdomen pelvis 03/11/2023 CT angio chest abdomen pelvis 08/24/2022 FINDINGS: CTA CHEST FINDINGS Cardiovascular: Satisfactory opacification of the pulmonary arteries to the segmental level. No evidence of pulmonary embolism. The main pulmonary artery is normal in caliber. Normal heart size. No significant pericardial effusion. The thoracic aorta is normal in caliber. No atherosclerotic plaque of the thoracic aorta. No  coronary artery calcifications. Aortic valve replacement. Mediastinum/Nodes: Interval resolution of anterior mediastinal soft tissue density. Interval development of borderline enlarged mediastinal and inter lobar lymph nodes measuring up to 1 cm (2:36 737). No enlarged hilar or axillary lymph nodes. Thyroid gland, trachea, and esophagus demonstrate no significant findings. Lungs/Pleura: Developing bilateral lower lobe and lingular patchy peribronchovascular ground-glass airspace opacities. No pulmonary nodule. No pulmonary mass. No pleural effusion. No pneumothorax. Musculoskeletal: No chest wall abnormality. No suspicious lytic or blastic osseous lesions. No acute displaced fracture. Multilevel degenerative changes of the spine. Intact sternotomy wires. Review of the MIP images confirms the above findings. CT ABDOMEN and PELVIS FINDINGS Hepatobiliary: No focal liver abnormality. Status post cholecystectomy. No biliary dilatation. Pancreas: No focal lesion. Normal pancreatic contour. No surrounding inflammatory changes.  No main pancreatic ductal dilatation. Spleen: Normal in size without focal abnormality. Adrenals/Urinary Tract: No adrenal nodule bilaterally. Bilateral kidneys enhance symmetrically. No hydronephrosis. No hydroureter. The urinary bladder is unremarkable. Stomach/Bowel: Stomach is within normal limits. No evidence of bowel wall thickening or dilatation. Nonspecific persistent small bowel mesentery swirling (4:61). Appendix appears normal. Vascular/Lymphatic: No abdominal aorta or iliac aneurysm. Mild atherosclerotic plaque of the aorta and its branches. No abdominal, pelvic, or inguinal lymphadenopathy. Reproductive: Left corpus luteum cyst-no further follow-up indicated. Interval development of a 3.7 x 3.1 cm right ovarian cystic lesion-no further follow-up indicated. Status post hysterectomy. Other: Nonspecific trace pelvic intraperitoneal simple free fluid. No intraperitoneal free gas. No  organized fluid collection. Musculoskeletal: Tiny fat containing supraumbilical ventral hernia (4:18). No suspicious lytic or blastic osseous lesions. No acute displaced fracture. Multilevel degenerative changes of the spine. Review of the MIP images confirms the above findings. IMPRESSION: 1. No pulmonary embolus. 2. Developing bilateral lower lobe and lingular patchy peribronchovascular ground-glass airspace opacities. Findings could represent developing infection/inflammation. 3. Interval development of nonspecific borderline enlarged mediastinal and interlobar lymph nodes likely reactive in etiology. Recommend attention on follow. 4. No acute intra-abdominal or intrapelvic abnormality. 5. Status post hysterectomy. 6.  Aortic Atherosclerosis (ICD10-I70.0). Electronically Signed   By: Tish Frederickson M.D.   On: 07/13/2023 01:40   DG Chest 2 View Result Date: 07/12/2023 CLINICAL DATA:  Chest pain EXAM: CHEST - 2 VIEW COMPARISON:  03/20/2023 FINDINGS: Prior median sternotomy and valve replacement. Heart is borderline in size. Lungs clear. No effusions or edema. No acute bony abnormality IMPRESSION: No active cardiopulmonary disease. Electronically Signed   By: Charlett Nose M.D.   On: 07/12/2023 20:10    ECHO pending  TELEMETRY reviewed by me 07/16/2023: Sinus rhythm rate 60s  EKG reviewed by me: Sinus rhythm rate 71 bpm, nonacute  Data reviewed by me 07/16/2023: last 24h vitals tele labs imaging I/O hospitalist progress note  Principal Problem:   Elevated troponin Active Problems:   Hypertensive urgency   Hypokalemia   Intractable nausea and vomiting   (HFpEF) heart failure with preserved ejection fraction (HCC)   Hypomagnesemia   Obesity, Class III, BMI 40-49.9 (morbid obesity) (HCC)   Subtherapeutic international normalized ratio (INR)   Chronic use of opiate drug for therapeutic purpose   History of heavy alcohol consumption   Chronic pancreatitis (HCC)   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 female  with a past medical history of severe aortic stenosis s/p mechanical AVR 04/10/2022, ascending aortic aneurysm s/p repair 03/2022, chronic diastolic heart failure, hypertension, hyperlipidemia, alcohol induced chronic pancreatitis, chronic pain who presented to the ED on 07/12/2023 for abdominal pain and vomiting.  Troponin checked and found to be mildly elevated and flat.  Cardiology was consulted for further evaluation.   # Demand ischemia # Chronic alcoholic pancreatitis # Intractable nausea/vomiting # Hypertensive urgency With abdominal pain and vomiting for few days prior to presentation.  Similar to prior episodes of pancreatitis.  Also with significantly elevated BP in the ED.  Troponins trended 117 > 114.  Denies chest pain.  EKG nonacute. LHC 2023 with no significant CAD. -Mildly elevated and flat trending troponin most consistent with demand/supply mismatch and not ACS. -Management of pancreatitis as per primary.  # S/p mechanical AVR 03/2022 # S/p ascending aortic aneurysm repair 03/2022 # Chronic HFpEF Patient  with history of severe aortic stenosis and ascending aortic aneurysm status post mechanical AVR and repair of aneurysm 03/2022 at [facility] with Dr. Kizzie Bane.  -Echo pending for updated evaluation of valve function.  -Warfarin as per pharmacy.  INR goal of 1.5-2 per [facility] notes. -Patient will need to follow up with outpatient cardiologist.    This patient's plan of care was discussed and created with Dr. Juliann Pares and he is in agreement.  Signed: Gale Journey, PA-C  07/16/2023, 11:14 AM [facility]

## 2023-07-16 NOTE — Progress Notes (Signed)
*  PRELIMINARY RESULTS* Echocardiogram 2D Echocardiogram has been performed.  Carolyne Fiscal 07/16/2023, 12:03 PM

## 2023-07-16 NOTE — Progress Notes (Signed)
Progress Note   Patient: Sarah Sosa EXB:[id number] DOB: 17-Nov-1980 DOA: 07/12/2023     3 DOS: the patient was seen and examined on 07/16/2023   Brief hospital course: Sarah Sosa is a 42 y.o. female with medical history significant for Chronic diastolic CHF, nonrheumatic aortic insufficiency s/p mechanical AVR at [facility] 03/2022 on Coumadin,, HTN, ascending aortic aneurysm, asthma, hypertension, morbid obesity chronic pain on suboxone, methadone and oxycodone from the [facility] pain clinic, peripheral neuropathy and alcohol induced chronic pancreatitis, who presents to the ED with a 3-day history of abdominal pain typical for her pancreatitis and a 1 day history of vomiting.    Patient is admitted to the hospitalist service for further management evaluation of community-acquired pneumonia, acute on chronic alcoholic pancreatitis, elevated troponin, electrolyte abnormalities, subtherapeutic INR.  Assessment and Plan: * Elevated troponin Type II MI In the setting of intractable pain with vomiting as well as hypertensive urgency Troponin remain flat 117-114. Cardiology evaluation appreciated. [facility] cardiology follow-up. Continue Coumadin with lovenox bridging per pharmacy protocol. Echocardiogram pending.  CAP (community acquired pneumonia) Continue Rocephin and azithromycin Continue antitussives  Intractable nausea and vomiting Acute on chronic alcoholic pancreatitis Patient with pain typical for her to pancreatitis, she is unable to tolerate oral meds. Clear liquid diets advanced to regular today. Continue pain control, antiemetics  Hypomagnesemia Hypokalemia Potassium oral supplements ordered. Mag level added to prior labs.  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 at [facility] Subtherapeutic INR INR 1 today.  Goal INR 1.5-2 per [facility] INR clinic. Continue Lovenox bridging and daily dosing of Coumadin per pharmacy. Pending echo.  Hypertensive  urgency BP better now with pain controlled IV hydralazine as needed for elevated blood pressures Continue Lasix, spironolactone.  (HFpEF) heart failure with preserved ejection fraction (HCC) Clinically euvolemic. Continue home dose spironolactone and Lasix  History of heavy alcohol consumption In remission. Cessation of alcohol advised.  Chronic use of opiate drug for therapeutic purpose Oral home pain meds will be restarted.  Obesity, Class III, BMI 40-49.9 (morbid obesity) (HCC) Complicating factor to overall prognosis and care Diet, exercise and weight reduction advised.    Out of bed to chair. Incentive spirometry. Nursing supportive care. Fall, aspiration precautions. DVT prophylaxis   Code Status: Full Code  Subjective: Patient is seen and examined today morning.  Nausea better.  She wishes to advance diet. Sitting in chair, denies any complaints.  Physical Exam: Vitals:   07/15/23 0920 07/15/23 2007 07/16/23 0344 07/16/23 0847  BP: (!) 165/108 (!) 182/98 (!) 161/99 (!) 188/99  Pulse: 60 62 66 61  Resp: 18 18 18 18   Temp: 98.2 F (36.8 C) 98.6 F (37 C) 98.7 F (37.1 C) 98.3 F (36.8 C)  TempSrc: Oral Oral Oral Oral  SpO2: 100% 100% 98%   Weight:      Height:        General - Elderly obese African-American female, mild distress due to pain HEENT - PERRLA, EOMI, atraumatic head, non tender sinuses. Lung -distant breath sounds, basal rales, rhonchi, no wheezes. Heart - S1, S2 heard, no murmurs, rubs, trace pedal edema. Abdomen - Soft, non tender, obese, bowel sounds good Neuro - Alert, awake and oriented x 3, non focal exam. Skin - Warm and dry.  Data Reviewed:      Latest Ref Rng & Units 07/16/2023    4:07 AM 07/15/2023    5:51 AM 07/14/2023    6:57 AM  CBC  WBC 4.0 - 10.5 K/uL 5.6  5.4  6.0   Hemoglobin 12.0 - 15.0 g/dL 60.1  09.3  23.5   Hematocrit 36.0 - 46.0 % 35.9  35.6  36.1   Platelets 150 - 400 K/uL 232  204  215       Latest Ref Rng  & Units 07/16/2023    4:07 AM 07/14/2023    6:57 AM 07/12/2023    8:38 PM  BMP  Glucose 70 - 99 mg/dL 573  220  97   BUN 6 - 20 mg/dL 6  7  8    Creatinine 0.44 - 1.00 mg/dL 2.54  2.70  6.23   Sodium 135 - 145 mmol/L 137  139  137   Potassium 3.5 - 5.1 mmol/L 3.1  3.3  3.1   Chloride 98 - 111 mmol/L 99  105  98   CO2 22 - 32 mmol/L 24  27  26    Calcium 8.9 - 10.3 mg/dL 8.4  8.3  9.1    No results found.   Family Communication: Discussed with patient, she understand and agree. All questions answereed.   Disposition: Status is: Inpatient Remains inpatient appropriate because: Sub therapeutic INR, pending INR, advance diet  Planned Discharge Destination: Home     Time spent: 36 minutes  Author: Marcelino Duster, MD 07/16/2023 2:53 PM Secure chat 7am to 7pm For on call review [email or web address].

## 2023-07-16 NOTE — Progress Notes (Signed)
ANTICOAGULATION CONSULT NOTE  Pharmacy Consult for lovenox bridge and warfarin  Indication:Aortic Mechanical Valve   (warfarin PTA)  Allergies  Allergen Reactions   Latex Anaphylaxis, Hives and Itching    Other reaction(s): Other (See Comments) SKIN BURNING & PEELING    Nsaids Other (See Comments)    Serve ulcers; stomach aches, GERD.  Other Reaction(s): Other (See Comments)   Tomato Anaphylaxis, Hives and Itching   Gabapentin Palpitations    Has heart condition; also makes jittery   Metformin     Other Reaction(s): Abdominal Pain  Causes pancreatitis to worsen   Lisinopril     Other Reaction(s): Angioedema   Tramadol Itching and Hives   Duloxetine Anxiety    Makes her jittery    Patient Measurements: Height: 5\' 7"  (170.2 cm) Weight: 123.4 kg (272 lb) IBW/kg (Calculated) : 61.6 Heparin Dosing Weight: 90.9 kg  Vital Signs: Temp: 98.3 F (36.8 C) (12/30 0847) Temp Source: Oral (12/30 0847) BP: 188/99 (12/30 0847) Pulse Rate: 61 (12/30 0847)  Labs: Recent Labs    07/14/23 0657 07/15/23 0551 07/16/23 0407  HGB 12.0 11.7* 12.1  HCT 36.1 35.6* 35.9*  PLT 215 204 232  LABPROT 14.0 13.8 13.9  INR 1.1 1.0 1.0  CREATININE 0.71  --  0.74    Estimated Creatinine Clearance: 126.1 mL/min (by C-G formula based on SCr of 0.74 mg/dL).   Medical History: Past Medical History:  Diagnosis Date   Arthritis    Asthma    Chronic pain disorder 03/19/2018   GERD (gastroesophageal reflux disease)    Headache    Heart murmur    History of trichomoniasis 03/19/2018   Hypertension    Intractable nausea and vomiting    Pancreatitis    PID (pelvic inflammatory disease) 02/14/2018   Uterine leiomyoma 03/19/2018    Assessment: Pt is a 42 yo female presenting to ED abdominal and chest pain found with elevated troponin I level.  Pt with hx of aortic valve replacement on warfarin at home.  INR at admission 1.1. INR likely below goal given 3-day history of abdominal pain,  1-day history of vomiting, and inability to keep her meds down. Pharmacy was consulted to re-start home warfarin.  Warfarin home dose: 7.5 mg T/Th and 3.25 mg all other days Baseline INR: 1.3    Warfarin assessment: Date INR Warfarin Dose  12/27 1.3 5 mg (ordered)  12/28 1.1 5 mg  12/29 1.0 7.5 mg  12/30 1.0             Goal of Therapy:  INR goal 1.5-2.0- per chart review from anti-coag visit 07/09/23 Monitor platelets by anticoagulation protocol: Yes   Lovenox Plan:  Will continue  therapeutic lovenox 1 mg/kg (120 mg) q12h to bridge until INR therapeutic  (per [facility] anticoagulation clinic visit note 07/09/23:  INR goal 1.5 to 2.0) Continue to monitor CBC daily  Warfarin Plan:  INR subtherapeutic Will order warfarin 10 mg x 1  today  Note DDI with azithromycin Continue to monitor INR daily   Bari Mantis PharmD Clinical Pharmacist 07/16/2023

## 2023-07-17 DIAGNOSIS — Z952 Presence of prosthetic heart valve: Secondary | ICD-10-CM | POA: Diagnosis not present

## 2023-07-17 DIAGNOSIS — J189 Pneumonia, unspecified organism: Secondary | ICD-10-CM | POA: Diagnosis not present

## 2023-07-17 DIAGNOSIS — R7989 Other specified abnormal findings of blood chemistry: Secondary | ICD-10-CM | POA: Diagnosis not present

## 2023-07-17 DIAGNOSIS — R791 Abnormal coagulation profile: Secondary | ICD-10-CM | POA: Diagnosis not present

## 2023-07-17 LAB — PROTIME-INR
INR: 1.1 (ref 0.8–1.2)
Prothrombin Time: 14.4 s (ref 11.4–15.2)

## 2023-07-17 LAB — BASIC METABOLIC PANEL
Anion gap: 8 (ref 5–15)
BUN: 7 mg/dL (ref 6–20)
CO2: 23 mmol/L (ref 22–32)
Calcium: 9 mg/dL (ref 8.9–10.3)
Chloride: 103 mmol/L (ref 98–111)
Creatinine, Ser: 0.73 mg/dL (ref 0.44–1.00)
GFR, Estimated: >60 mL/min (ref >60)
Glucose, Bld: 129 mg/dL — ABNORMAL HIGH (ref 70–99)
Potassium: 3.2 mmol/L — ABNORMAL LOW (ref 3.5–5.1)
Sodium: 134 mmol/L — ABNORMAL LOW (ref 135–145)

## 2023-07-17 LAB — MAGNESIUM: Magnesium: 1.4 mg/dL — ABNORMAL LOW (ref 1.7–2.4)

## 2023-07-17 MED ORDER — TRAZODONE HCL 100 MG PO TABS
100.0000 mg | ORAL_TABLET | Freq: Once | ORAL | Status: AC
  Administered 2023-07-17: 100 mg via ORAL
  Filled 2023-07-17: qty 1

## 2023-07-17 MED ORDER — MAGNESIUM SULFATE 4 GM/100ML IV SOLN
4.0000 g | Freq: Once | INTRAVENOUS | Status: AC
  Administered 2023-07-17: 4 g via INTRAVENOUS
  Filled 2023-07-17: qty 100

## 2023-07-17 MED ORDER — WARFARIN SODIUM 10 MG PO TABS
10.0000 mg | ORAL_TABLET | Freq: Once | ORAL | Status: AC
  Administered 2023-07-17: 10 mg via ORAL
  Filled 2023-07-17: qty 1

## 2023-07-17 MED ORDER — TRAZODONE HCL 100 MG PO TABS: 100.0000 mg | ORAL_TABLET | Freq: Every day | ORAL | Status: DC

## 2023-07-17 MED ORDER — POTASSIUM CHLORIDE CRYS ER 20 MEQ PO TBCR
40.0000 meq | EXTENDED_RELEASE_TABLET | Freq: Once | ORAL | Status: DC
  Filled 2023-07-17: qty 2

## 2023-07-17 MED ORDER — FUROSEMIDE 40 MG PO TABS
40.0000 mg | ORAL_TABLET | Freq: Every day | ORAL | Status: DC
  Filled 2023-07-17 (×2): qty 1

## 2023-07-17 MED ORDER — AMLODIPINE BESYLATE 10 MG PO TABS
10.0000 mg | ORAL_TABLET | Freq: Every day | ORAL | Status: DC
  Administered 2023-07-17 – 2023-07-18 (×2): 10 mg via ORAL
  Filled 2023-07-17 (×2): qty 1

## 2023-07-17 MED ORDER — TRAZODONE HCL 100 MG PO TABS
100.0000 mg | ORAL_TABLET | Freq: Every day | ORAL | Status: DC
  Administered 2023-07-17: 100 mg via ORAL

## 2023-07-17 NOTE — Progress Notes (Signed)
 PHARMACY CONSULT NOTE - FOLLOW UP  Pharmacy Consult for Electrolyte Monitoring and Replacement   Recent Labs: Potassium (mmol/L)  Date Value  07/17/2023 3.2 (L)   Magnesium  (mg/dL)  Date Value  87/68/7975 1.4 (L)   Calcium  (mg/dL)  Date Value  87/68/7975 9.0   Albumin (g/dL)  Date Value  90/96/7975 3.7   Phosphorus (mg/dL)  Date Value  94/86/7976 4.3   Sodium (mmol/L)  Date Value  07/17/2023 134 (L)    Assessment: 42 yo female w/ PMH of Chronic diastolic CHF, nonrheumatic aortic insufficiency s/p mechanical AVR at [facility] 03/2022 on Coumadin ,, HTN, ascending aortic aneurysm, asthma, hypertension, morbid obesity chronic pain on suboxone , methadone  and oxycodone  from the [facility] pain clinic, peripheral neuropathy and alcohol induced chronic pancreatitis presenting to ED abdominal and chest pain found with elevated troponin I level. Pt with hx of aortic valve replacement on warfarin at home.   Pertinent Medications:  1) magnesium  oxide 400 mg po once daily 2) KCl 40 mEq po once daily  Goal of Therapy:  Electrolytes WNL  Plan:  ---4 grams IV magnesium  sulfate x 1 ---40 mEq po KCl x 1 ---recheck electrolytes in am  Adriana JONETTA Bolster ,PharmD Clinical Pharmacist 07/17/2023 12:03 PM

## 2023-07-17 NOTE — Progress Notes (Addendum)
 [facility] CLINIC CARDIOLOGY PROGRESS NOTE       Patient ID: Sarah Sosa MRN: [id number] DOB/AGE: 1981/07/05 42 y.o.  Admit date: 07/12/2023 Referring Physician Dr. Delayne [facility]  Primary Cardiologist Dr. Camellia Ada [facility]) Reason for Consultation elevated troponin  HPI: Sarah Sosa is a 42 y.o. female  with a past medical history of severe aortic stenosis s/p mechanical AVR 04/10/2022, ascending aortic aneurysm s/p repair 03/2022, chronic diastolic heart failure, hypertension, hyperlipidemia, alcohol induced chronic pancreatitis, chronic pain who presented to the ED on 07/12/2023 for abdominal pain and vomiting.  Troponin checked and found to be mildly elevated and flat.  Cardiology was consulted for further evaluation.   Interval history: -Patient reports feeling ok this AM. BP remains borderline elevated.  -Still has cough. Denies any CP or SOB at the time of my evaluation.  -HR remains controlled on tele.   Review of systems complete and found to be negative unless listed above    Past Medical History:  Diagnosis Date   Arthritis    Asthma    Chronic pain disorder 03/19/2018   GERD (gastroesophageal reflux disease)    Headache    Heart murmur    History of trichomoniasis 03/19/2018   Hypertension    Intractable nausea and vomiting    Pancreatitis    PID (pelvic inflammatory disease) 02/14/2018   Uterine leiomyoma 03/19/2018    Past Surgical History:  Procedure Laterality Date   ABDOMINAL HYSTERECTOMY     AORTIC VALVE REPLACEMENT  03/2022   CHOLECYSTECTOMY     DENTAL SURGERY     HYSTERECTOMY ABDOMINAL WITH SALPINGECTOMY Bilateral 04/15/2018   Procedure: HYSTERECTOMY ABDOMINAL WITH BILATERAL SALPINGECTOMY;  Surgeon: Connell Davies, MD;  Location: [facility] ORS;  Service: Gynecology;  Laterality: Bilateral;   kenn surgery Bilateral    KNEE ARTHROSCOPY Left 2012, 2013    Medications Prior to Admission  Medication Sig  Dispense Refill Last Dose/Taking   amitriptyline  (ELAVIL ) 10 MG tablet Take 10 mg by mouth 3 (three) times daily as needed.   Taking As Needed   APPLE CIDER VINEGAR PO Take 1 capsule by mouth 3 (three) times daily.   Past Week   aspirin  81 MG chewable tablet Chew 81 mg by mouth daily.   Past Week   atorvastatin  (LIPITOR) 40 MG tablet Take 40 mg by mouth daily.   Past Week   Buprenorphine  HCl-Naloxone  HCl 8-2 MG FILM Place 1 Film under the tongue 4 (four) times daily as needed.   Taking As Needed   metFORMIN  (GLUCOPHAGE ) 500 MG tablet Take 500 mg by mouth every morning.   Past Week   methadone  (DOLOPHINE ) 10 MG tablet Take 10 mg by mouth 4 (four) times daily.   Past Week   omega-3 acid ethyl esters (LOVAZA ) 1 g capsule Take 1 g by mouth 2 (two) times daily.   Past Week   omeprazole (PRILOSEC) 40 MG capsule Take 40 mg by mouth daily.   Past Week   spironolactone  (ALDACTONE ) 25 MG tablet Take 25 mg by mouth daily.   Past Week   traZODone  (DESYREL ) 50 MG tablet Take 100 mg by mouth at bedtime.   Past Week   warfarin (COUMADIN ) 7.5 MG tablet Take 3.25-7.5 mg by mouth daily. Take 3.25mg   M/W/F/S/Sun and T/Th 7.5mg  (1 tab)   07/11/2023   naloxone  (NARCAN ) nasal spray 4 mg/0.1 mL Place 1 spray into the nose once as needed (to reverse overdose).      Oxycodone  HCl  10 MG TABS Take 1 tablet (10 mg total) by mouth every 6 (six) hours as needed. (Patient taking differently: Take 5-10 mg by mouth every 6 (six) hours as needed.) 10 tablet 0    Social History   Socioeconomic History   Marital status: Single    Spouse name: Not on file   Number of children: Not on file   Years of education: Not on file   Highest education level: Not on file  Occupational History   Not on file  Tobacco Use   Smoking status: Some Days    Current packs/day: 0.50    Average packs/day: 0.5 packs/day for 22.0 years (11.0 ttl pk-yrs)    Types: Cigarettes   Smokeless tobacco: Never  Vaping Use   Vaping status: Never Used   Substance and Sexual Activity   Alcohol use: Not Currently    Comment: occasional   Drug use: Yes    Types: Marijuana   Sexual activity: Not on file  Other Topics Concern   Not on file  Social History Narrative   Not on file   Social Drivers of Health   Financial Resource Strain: Medium Risk (08/10/2021)   Received from [facility], [facility]   Overall Financial Resource Strain (CARDIA)    Difficulty of Paying Living Expenses: Somewhat hard  Food Insecurity: No Food Insecurity (07/13/2023)   Hunger Vital Sign    Worried About Running Out of Food in the Last Year: Never true    Ran Out of Food in the Last Year: Never true  Transportation Needs: No Transportation Needs (07/13/2023)   PRAPARE - Administrator, Civil Service (Medical): No    Lack of Transportation (Non-Medical): No  Physical Activity: Not on file  Stress: Stress Concern Present (06/05/2021)   Received from [facility], [facility]   [employer] of Occupational Health - Occupational Stress Questionnaire    Feeling of Stress : Rather much  Social Connections: Moderately Isolated (06/05/2021)   Received from [facility], [facility]   Social Connection and Isolation Panel [NHANES]    Frequency of Communication with Friends and Family: More than three times a week    Frequency of Social Gatherings with Friends and Family: Twice a week    Attends Religious Services: Never    Database Administrator or Organizations: No    Attends Engineer, Structural: Patient declined    Marital Status: Living with partner  Intimate Partner Violence: Not At Risk (07/13/2023)   Humiliation, Afraid, Rape, and Kick questionnaire    Fear of Current or Ex-Partner: No    Emotionally Abused: No    Physically Abused: No    Sexually Abused: No    Family History  Problem Relation Age of Onset   Fibroids  Mother    Hypertension Mother    Arthritis Mother      Vitals:   07/16/23 2325 07/16/23 2326 07/17/23 0253 07/17/23 0732  BP: (!) 166/106 (!) 169/94 (!) 173/88 (!) 150/82  Pulse: (!) 59 (!) 57 (!) 52 (!) 58  Resp: 18  18 16   Temp: 98.8 F (37.1 C)  98.2 F (36.8 C) 98.2 F (36.8 C)  TempSrc: Oral  Oral Oral  SpO2: 100%  94% 94%  Weight:      Height:        PHYSICAL EXAM General: Chronically ill-appearing female, well nourished, in no acute distress. HEENT: Normocephalic  and atraumatic. Neck: No JVD.  Lungs: Normal respiratory effort on room air. Clear bilaterally to auscultation. No wheezes, crackles, rhonchi.  Heart: HRRR. Normal S1 and S2.  Systolic murmur.  Abdomen: Non-distended appearing.  Msk: Normal strength and tone for age. Extremities: Warm and well perfused. No clubbing, cyanosis.  No edema.  Neuro: Alert and oriented X 3. Psych: Answers questions appropriately.   Labs: Basic Metabolic Panel: Recent Labs    07/16/23 0407 07/17/23 0638  NA 137 134*  K 3.1* 3.2*  CL 99 103  CO2 24 23  GLUCOSE 118* 129*  BUN 6 7  CREATININE 0.74 0.73  CALCIUM  8.4* 9.0  MG 1.6* 1.4*   Liver Function Tests: No results for input(s): AST, ALT, ALKPHOS, BILITOT, PROT, ALBUMIN in the last 72 hours. No results for input(s): LIPASE, AMYLASE in the last 72 hours.  CBC: Recent Labs    07/15/23 0551 07/16/23 0407  WBC 5.4 5.6  HGB 11.7* 12.1  HCT 35.6* 35.9*  MCV 90.8 88.0  PLT 204 232   Cardiac Enzymes: No results for input(s): CKTOTAL, CKMB, CKMBINDEX, TROPONINIHS in the last 72 hours.  BNP: No results for input(s): BNP in the last 72 hours. D-Dimer: No results for input(s): DDIMER in the last 72 hours. Hemoglobin A1C: No results for input(s): HGBA1C in the last 72 hours. Fasting Lipid Panel: No results for input(s): CHOL, HDL, LDLCALC, TRIG, CHOLHDL, LDLDIRECT in the last 72 hours. Thyroid Function Tests: No results  for input(s): TSH, T4TOTAL, T3FREE, THYROIDAB in the last 72 hours.  Invalid input(s): FREET3 Anemia Panel: No results for input(s): VITAMINB12, FOLATE, FERRITIN, TIBC, IRON, RETICCTPCT in the last 72 hours.   Radiology: ECHOCARDIOGRAM COMPLETE Result Date: 07/16/2023    ECHOCARDIOGRAM REPORT   Patient Name:   SHAUNTAVIA BRACKIN Date of Exam: 07/16/2023 Medical Rec #:  [id number]     Height:       67.0 in Accession #:    [id number]    Weight:       272.0 lb Date of Birth:  1980-08-12     BSA:          2.305 m Patient Age:    42 years      BP:           188/99 mmHg Patient Gender: F             HR:           54 bpm. Exam Location:  [facility] Procedure: 2D Echo, Cardiac Doppler and Color Doppler Indications:     Elevated Troponin  History:         Patient has prior history of Echocardiogram examinations, most                  recent 05/10/2017. CHF, Abnormal ECG; Risk Factors:Hypertension                  and Current Smoker. ETOH and Substance abuse, S/P Ascending                  Aortic graft. There is a mechanical valve in the Aortic                  position. Procedure done at [facility] 03/2022.  Sonographer:     Naomie Reef Referring Phys:  [id number] DELAYNE LULLA SOLIAN Diagnosing Phys: Dwayne D Callwood MD  Sonographer Comments: Patient is obese. IMPRESSIONS  1. Left ventricular ejection fraction, by estimation, is 70 to 75%. The left  ventricle has hyperdynamic function. The left ventricle has no regional wall motion abnormalities. There is moderate concentric left ventricular hypertrophy. Left ventricular diastolic parameters are consistent with Grade II diastolic dysfunction (pseudonormalization).  2. Right ventricular systolic function is normal. The right ventricular size is normal.  3. The mitral valve is normal in structure. Trivial mitral valve regurgitation.  4. The aortic valve is normal in structure. Aortic valve regurgitation is not visualized. FINDINGS  Left Ventricle: Left ventricular  ejection fraction, by estimation, is 70 to 75%. The left ventricle has hyperdynamic function. The left ventricle has no regional wall motion abnormalities. The left ventricular internal cavity size was normal in size. There is moderate concentric left ventricular hypertrophy. Left ventricular diastolic parameters are consistent with Grade II diastolic dysfunction (pseudonormalization). Right Ventricle: The right ventricular size is normal. No increase in right ventricular wall thickness. Right ventricular systolic function is normal. Left Atrium: Left atrial size was normal in size. Right Atrium: Right atrial size was normal in size. Pericardium: There is no evidence of pericardial effusion. Mitral Valve: The mitral valve is normal in structure. Trivial mitral valve regurgitation. MV peak gradient, 5.7 mmHg. The mean mitral valve gradient is 2.0 mmHg. Tricuspid Valve: The tricuspid valve is normal in structure. Tricuspid valve regurgitation is trivial. Aortic Valve: The aortic valve is normal in structure. Aortic valve regurgitation is not visualized. Aortic valve mean gradient measures 24.7 mmHg. Aortic valve peak gradient measures 45.0 mmHg. Aortic valve area, by VTI measures 1.61 cm. Pulmonic Valve: The pulmonic valve was normal in structure. Pulmonic valve regurgitation is not visualized. Aorta: The ascending aorta was not well visualized. IAS/Shunts: No atrial level shunt detected by color flow Doppler.  LEFT VENTRICLE PLAX 2D LVIDd:         4.90 cm   Diastology LVIDs:         2.90 cm   LV e' medial:    6.53 cm/s LV PW:         1.80 cm   LV E/e' medial:  13.7 LV IVS:        1.70 cm   LV e' lateral:   6.09 cm/s LVOT diam:     2.00 cm   LV E/e' lateral: 14.7 LV SV:         107 LV SV Index:   46 LVOT Area:     3.14 cm  RIGHT VENTRICLE RV Basal diam:  4.80 cm RV Mid diam:    3.80 cm RV S prime:     13.10 cm/s LEFT ATRIUM             Index        RIGHT ATRIUM           Index LA diam:        4.70 cm 2.04 cm/m   RA  Area:     22.60 cm LA Vol (A2C):   55.7 ml 24.17 ml/m  RA Volume:   69.10 ml  29.98 ml/m LA Vol (A4C):   71.0 ml 30.81 ml/m LA Biplane Vol: 63.2 ml 27.42 ml/m  AORTIC VALVE                     PULMONIC VALVE AV Area (Vmax):    1.59 cm      PV Vmax:       1.21 m/s AV Area (Vmean):   1.44 cm      PV Peak grad:  5.9 mmHg AV Area (VTI):  1.61 cm AV Vmax:           335.33 cm/s AV Vmean:          231.000 cm/s AV VTI:            0.664 m AV Peak Grad:      45.0 mmHg AV Mean Grad:      24.7 mmHg LVOT Vmax:         169.50 cm/s LVOT Vmean:        106.000 cm/s LVOT VTI:          0.340 m LVOT/AV VTI ratio: 0.51  AORTA Ao Root diam: 3.50 cm Ao Asc diam:  3.00 cm MITRAL VALVE MV Area (PHT): 2.48 cm    SHUNTS MV Area VTI:   2.39 cm    Systemic VTI:  0.34 m MV Peak grad:  5.7 mmHg    Systemic Diam: 2.00 cm MV Mean grad:  2.0 mmHg MV Vmax:       1.19 m/s MV Vmean:      59.7 cm/s MV Decel Time: 306 msec MV E velocity: 89.60 cm/s MV A velocity: 71.80 cm/s MV E/A ratio:  1.25 Cara JONETTA Lovelace MD Electronically signed by Cara JONETTA Lovelace MD Signature Date/Time: 07/16/2023/6:20:23 PM    Final    CT Angio Chest PE W and/or Wo Contrast Result Date: 07/13/2023 CLINICAL DATA:  chest pain, elevated trops. eval PE. hx AVR; epigastric pain, emesis. hx chronic pancreatitis EXAM: CT ANGIOGRAPHY CHEST CT ABDOMEN AND PELVIS WITH CONTRAST TECHNIQUE: Multidetector CT imaging of the chest was performed using the standard protocol during bolus administration of intravenous contrast. Multiplanar CT image reconstructions and MIPs were obtained to evaluate the vascular anatomy. Multidetector CT imaging of the abdomen and pelvis was performed using the standard protocol during bolus administration of intravenous contrast. RADIATION DOSE REDUCTION: This exam was performed according to the departmental dose-optimization program which includes automated exposure control, adjustment of the mA and/or kV according to patient size and/or use of  iterative reconstruction technique. CONTRAST:  OMNIPAQUE  IOHEXOL  350 MG/ML SOLN COMPARISON:  CT abdomen pelvis 03/11/2023 CT angio chest abdomen pelvis 08/24/2022 FINDINGS: CTA CHEST FINDINGS Cardiovascular: Satisfactory opacification of the pulmonary arteries to the segmental level. No evidence of pulmonary embolism. The main pulmonary artery is normal in caliber. Normal heart size. No significant pericardial effusion. The thoracic aorta is normal in caliber. No atherosclerotic plaque of the thoracic aorta. No coronary artery calcifications. Aortic valve replacement. Mediastinum/Nodes: Interval resolution of anterior mediastinal soft tissue density. Interval development of borderline enlarged mediastinal and inter lobar lymph nodes measuring up to 1 cm (2:36 737). No enlarged hilar or axillary lymph nodes. Thyroid gland, trachea, and esophagus demonstrate no significant findings. Lungs/Pleura: Developing bilateral lower lobe and lingular patchy peribronchovascular ground-glass airspace opacities. No pulmonary nodule. No pulmonary mass. No pleural effusion. No pneumothorax. Musculoskeletal: No chest wall abnormality. No suspicious lytic or blastic osseous lesions. No acute displaced fracture. Multilevel degenerative changes of the spine. Intact sternotomy wires. Review of the MIP images confirms the above findings. CT ABDOMEN and PELVIS FINDINGS Hepatobiliary: No focal liver abnormality. Status post cholecystectomy. No biliary dilatation. Pancreas: No focal lesion. Normal pancreatic contour. No surrounding inflammatory changes. No main pancreatic ductal dilatation. Spleen: Normal in size without focal abnormality. Adrenals/Urinary Tract: No adrenal nodule bilaterally. Bilateral kidneys enhance symmetrically. No hydronephrosis. No hydroureter. The urinary bladder is unremarkable. Stomach/Bowel: Stomach is within normal limits. No evidence of bowel wall thickening or dilatation. Nonspecific persistent small  bowel mesentery swirling (4:61). Appendix appears normal. Vascular/Lymphatic: No abdominal aorta or iliac aneurysm. Mild atherosclerotic plaque of the aorta and its branches. No abdominal, pelvic, or inguinal lymphadenopathy. Reproductive: Left corpus luteum cyst-no further follow-up indicated. Interval development of a 3.7 x 3.1 cm right ovarian cystic lesion-no further follow-up indicated. Status post hysterectomy. Other: Nonspecific trace pelvic intraperitoneal simple free fluid. No intraperitoneal free gas. No organized fluid collection. Musculoskeletal: Tiny fat containing supraumbilical ventral hernia (4:18). No suspicious lytic or blastic osseous lesions. No acute displaced fracture. Multilevel degenerative changes of the spine. Review of the MIP images confirms the above findings. IMPRESSION: 1. No pulmonary embolus. 2. Developing bilateral lower lobe and lingular patchy peribronchovascular ground-glass airspace opacities. Findings could represent developing infection/inflammation. 3. Interval development of nonspecific borderline enlarged mediastinal and interlobar lymph nodes likely reactive in etiology. Recommend attention on follow. 4. No acute intra-abdominal or intrapelvic abnormality. 5. Status post hysterectomy. 6.  Aortic Atherosclerosis (ICD10-I70.0). Electronically Signed   By: Morgane  Naveau M.D.   On: 07/13/2023 01:40   CT ABDOMEN PELVIS W CONTRAST Result Date: 07/13/2023 CLINICAL DATA:  chest pain, elevated trops. eval PE. hx AVR; epigastric pain, emesis. hx chronic pancreatitis EXAM: CT ANGIOGRAPHY CHEST CT ABDOMEN AND PELVIS WITH CONTRAST TECHNIQUE: Multidetector CT imaging of the chest was performed using the standard protocol during bolus administration of intravenous contrast. Multiplanar CT image reconstructions and MIPs were obtained to evaluate the vascular anatomy. Multidetector CT imaging of the abdomen and pelvis was performed using the standard protocol during bolus  administration of intravenous contrast. RADIATION DOSE REDUCTION: This exam was performed according to the departmental dose-optimization program which includes automated exposure control, adjustment of the mA and/or kV according to patient size and/or use of iterative reconstruction technique. CONTRAST:  OMNIPAQUE  IOHEXOL  350 MG/ML SOLN COMPARISON:  CT abdomen pelvis 03/11/2023 CT angio chest abdomen pelvis 08/24/2022 FINDINGS: CTA CHEST FINDINGS Cardiovascular: Satisfactory opacification of the pulmonary arteries to the segmental level. No evidence of pulmonary embolism. The main pulmonary artery is normal in caliber. Normal heart size. No significant pericardial effusion. The thoracic aorta is normal in caliber. No atherosclerotic plaque of the thoracic aorta. No coronary artery calcifications. Aortic valve replacement. Mediastinum/Nodes: Interval resolution of anterior mediastinal soft tissue density. Interval development of borderline enlarged mediastinal and inter lobar lymph nodes measuring up to 1 cm (2:36 737). No enlarged hilar or axillary lymph nodes. Thyroid gland, trachea, and esophagus demonstrate no significant findings. Lungs/Pleura: Developing bilateral lower lobe and lingular patchy peribronchovascular ground-glass airspace opacities. No pulmonary nodule. No pulmonary mass. No pleural effusion. No pneumothorax. Musculoskeletal: No chest wall abnormality. No suspicious lytic or blastic osseous lesions. No acute displaced fracture. Multilevel degenerative changes of the spine. Intact sternotomy wires. Review of the MIP images confirms the above findings. CT ABDOMEN and PELVIS FINDINGS Hepatobiliary: No focal liver abnormality. Status post cholecystectomy. No biliary dilatation. Pancreas: No focal lesion. Normal pancreatic contour. No surrounding inflammatory changes. No main pancreatic ductal dilatation. Spleen: Normal in size without focal abnormality. Adrenals/Urinary Tract: No adrenal nodule  bilaterally. Bilateral kidneys enhance symmetrically. No hydronephrosis. No hydroureter. The urinary bladder is unremarkable. Stomach/Bowel: Stomach is within normal limits. No evidence of bowel wall thickening or dilatation. Nonspecific persistent small bowel mesentery swirling (4:61). Appendix appears normal. Vascular/Lymphatic: No abdominal aorta or iliac aneurysm. Mild atherosclerotic plaque of the aorta and its branches. No abdominal, pelvic, or inguinal lymphadenopathy. Reproductive: Left corpus luteum cyst-no further follow-up indicated. Interval development of a 3.7 x 3.1 cm right ovarian  cystic lesion-no further follow-up indicated. Status post hysterectomy. Other: Nonspecific trace pelvic intraperitoneal simple free fluid. No intraperitoneal free gas. No organized fluid collection. Musculoskeletal: Tiny fat containing supraumbilical ventral hernia (4:18). No suspicious lytic or blastic osseous lesions. No acute displaced fracture. Multilevel degenerative changes of the spine. Review of the MIP images confirms the above findings. IMPRESSION: 1. No pulmonary embolus. 2. Developing bilateral lower lobe and lingular patchy peribronchovascular ground-glass airspace opacities. Findings could represent developing infection/inflammation. 3. Interval development of nonspecific borderline enlarged mediastinal and interlobar lymph nodes likely reactive in etiology. Recommend attention on follow. 4. No acute intra-abdominal or intrapelvic abnormality. 5. Status post hysterectomy. 6.  Aortic Atherosclerosis (ICD10-I70.0). Electronically Signed   By: Morgane  Naveau M.D.   On: 07/13/2023 01:40   DG Chest 2 View Result Date: 07/12/2023 CLINICAL DATA:  Chest pain EXAM: CHEST - 2 VIEW COMPARISON:  03/20/2023 FINDINGS: Prior median sternotomy and valve replacement. Heart is borderline in size. Lungs clear. No effusions or edema. No acute bony abnormality IMPRESSION: No active cardiopulmonary disease. Electronically  Signed   By: Franky Crease M.D.   On: 07/12/2023 20:10    ECHO as above  TELEMETRY reviewed by me 07/17/2023: Sinus rhythm rate 50-60s  EKG reviewed by me: Sinus rhythm rate 71 bpm, nonacute  Data reviewed by me 07/17/2023: last 24h vitals tele labs imaging I/O hospitalist progress note  Principal Problem:   Elevated troponin Active Problems:   Hypertensive urgency   Hypokalemia   Intractable nausea and vomiting   (HFpEF) heart failure with preserved ejection fraction (HCC)   Hypomagnesemia   Obesity, Class III, BMI 40-49.9 (morbid obesity) (HCC)   Subtherapeutic international normalized ratio (INR)   Chronic use of opiate drug for therapeutic purpose   History of heavy alcohol consumption   Chronic pancreatitis (HCC)   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 female  with a past medical history of severe aortic stenosis s/p mechanical AVR 04/10/2022, ascending aortic aneurysm s/p repair 03/2022, chronic diastolic heart failure, hypertension, hyperlipidemia, alcohol induced chronic pancreatitis, chronic pain who presented to the ED on 07/12/2023 for abdominal pain and vomiting.  Troponin checked and found to be mildly elevated and flat.  Cardiology was consulted for further evaluation.   # Demand ischemia # Chronic alcoholic pancreatitis # Intractable nausea/vomiting # Hypertensive urgency With abdominal pain and vomiting for few days prior to presentation.  Similar to prior episodes of pancreatitis.  Also with significantly elevated BP in the ED.  Troponins trended 117 > 114.  Denies chest pain.  EKG nonacute. LHC 2023 with no significant CAD. -Mildly elevated and flat trending troponin most consistent with demand/supply mismatch and not ACS. -Management of pancreatitis as per primary. -No plan for further cardiac diagnostics.   # S/p mechanical AVR  03/2022 # S/p ascending aortic aneurysm repair 03/2022 # Chronic HFpEF Patient with history of severe aortic stenosis and ascending aortic aneurysm status post mechanical AVR and repair of aneurysm 03/2022 at [facility] with Dr. V[place]a. Echo this admission with EF 70-75%, valve appears to be functioning well.  -Warfarin as per pharmacy.  INR goal of 1.5-2 per [facility] notes. -Patient will need to follow up with outpatient cardiologist.   [facility] for discharge today from a cardiac perspective. Cardiology will sign off. Will arrange for follow up in clinic with Dr. Camellia Ada at [facility] in 1-2  weeks.    This patient's plan of care was discussed and created with Dr. Florencio and he is in agreement.  Signed: Danita Bloch, PA-C  07/17/2023, 9:36 AM [facility] Card[facility]

## 2023-07-17 NOTE — Progress Notes (Signed)
 ANTICOAGULATION CONSULT NOTE  Pharmacy Consult for lovenox  bridge and warfarin  Indication:Aortic Mechanical Valve   (warfarin PTA)  Allergies  Allergen Reactions   Latex Anaphylaxis, Hives and Itching    Other reaction(s): Other (See Comments) SKIN BURNING & PEELING    Nsaids Other (See Comments)    Serve ulcers; stomach aches, GERD.  Other Reaction(s): Other (See Comments)   Tomato Anaphylaxis, Hives and Itching   Gabapentin  Palpitations    Has heart condition; also makes jittery   Metformin      Other Reaction(s): Abdominal Pain  Causes pancreatitis to worsen   Lisinopril      Other Reaction(s): Angioedema   Tramadol Itching and Hives   Duloxetine Anxiety    Makes her jittery    Patient Measurements: Height: 5' 7 (170.2 cm) Weight: 123.4 kg (272 lb) IBW/kg (Calculated) : 61.6 Heparin  Dosing Weight: 90.9 kg  Vital Signs: Temp: 98.2 F (36.8 C) (12/31 0253) Temp Source: Oral (12/31 0253) BP: 173/88 (12/31 0253) Pulse Rate: 52 (12/31 0253)  Labs: Recent Labs    07/15/23 0551 07/16/23 0407 07/17/23 0638  HGB 11.7* 12.1  --   HCT 35.6* 35.9*  --   PLT 204 232  --   LABPROT 13.8 13.9 14.4  INR 1.0 1.0 1.1  CREATININE  --  0.74 0.73    Estimated Creatinine Clearance: 126.1 mL/min (by C-G formula based on SCr of 0.73 mg/dL).   Medical History: Past Medical History:  Diagnosis Date   Arthritis    Asthma    Chronic pain disorder 03/19/2018   GERD (gastroesophageal reflux disease)    Headache    Heart murmur    History of trichomoniasis 03/19/2018   Hypertension    Intractable nausea and vomiting    Pancreatitis    PID (pelvic inflammatory disease) 02/14/2018   Uterine leiomyoma 03/19/2018    Assessment: Pt is a 42 yo female presenting to ED abdominal and chest pain found with elevated troponin I level.  Pt with hx of aortic valve replacement on warfarin at home.  INR at admission 1.1. INR likely below goal given 3-day history of abdominal pain,  1-day history of vomiting, and inability to keep her meds down. Pharmacy was consulted to re-start home warfarin.  Warfarin home dose:  warfarin 7.5 mg on Tuesdays and Thursdays and 3.75 mg (1/2 tablet) all other days (TWD 33.75 mg).  Baseline INR: 1.3   Warfarin assessment: Date INR Warfarin Dose  12/27 1.3 5 mg (ordered)  12/28 1.1 5 mg  12/29 1.0 7.5 mg  12/30 1.0 10 mg            Goal of Therapy:  INR goal 1.5-2.0- per chart review from anti-coag visit 07/09/23 Monitor platelets by anticoagulation protocol: Yes   Lovenox  Plan:  Will continue  therapeutic lovenox  1 mg/kg (120 mg) q12h to bridge until INR therapeutic  (per [facility] anticoagulation clinic visit note 07/09/23:  INR goal 1.5 to 2.0) Continue to monitor CBC daily  Warfarin Plan:  INR remains subtherapeutic Will order warfarin 10 mg x 1  today  Continue to monitor INR daily   Adriana Bolster, PharmD, BCPS Clinical Pharmacist 07/17/2023

## 2023-07-17 NOTE — Progress Notes (Signed)
 Progress Note   Patient: Sarah Sosa FMW:[id number] DOB: Jun 28, 1981 DOA: 07/12/2023     4 DOS: the patient was seen and examined on 07/17/2023   Brief hospital course: [name] is a 42 y.o. female with medical history significant for Chronic diastolic CHF, nonrheumatic aortic insufficiency s/p mechanical AVR at [facility] 03/2022 on Coumadin ,, HTN, ascending aortic aneurysm, asthma, hypertension, morbid obesity chronic pain on suboxone , methadone  and oxycodone  from the [facility] pain clinic, peripheral neuropathy and alcohol induced chronic pancreatitis, who presents to the ED with a 3-day history of abdominal pain typical for her pancreatitis and a 1 day history of vomiting.    Patient is admitted to the hospitalist service for further management evaluation of community-acquired pneumonia, acute on chronic alcoholic pancreatitis, elevated troponin, electrolyte abnormalities, subtherapeutic INR.  Assessment and Plan: * Elevated troponin Type II MI In the setting of intractable pain with vomiting as well as hypertensive urgency Troponin remain flat 117-114. Echo unremarkable. Outpatient cardiology follow-up suggested. Continue Coumadin  with lovenox  bridging per pharmacy protocol. Echocardiogram pending.  CAP (community acquired pneumonia) Rocephin  and azithromycin  discontinued. Continue antitussives  Intractable nausea and vomiting Acute on chronic alcoholic pancreatitis Patient with pain typical for her to pancreatitis, she is unable to tolerate oral meds. Clear liquid diets advanced to regular, she is able to tolerate diet.. Continue pain control, antiemetics  Hypomagnesemia Hypokalemia Potassium oral supplements ordered. Mag level low. 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 Subtherapeutic INR INR 1.1 today.  Goal INR 1.5-2 per [facility] INR clinic. Continue Lovenox  bridging and daily dosing of Coumadin  per  pharmacy. Echo show EF 70-75%, valve appears to be functioning well.  No further work up, outpatient cardiology follow up. I advised her to take Lovenox  shots at home for 3 more days until INR is therapeutic and advised to get INR checked in 3 days.  Hypertensive urgency BP higher side Losartan  and Norvasc  started during this admission. Continue Lasix , spironolactone .  (HFpEF) heart failure with preserved ejection fraction (HCC) Clinically euvolemic. Continue home dose spironolactone  and Lasix   History of heavy alcohol consumption In remission. Cessation of alcohol advised.  Chronic use of opiate drug for therapeutic purpose Oral home pain meds will be restarted.  Obesity, Class III, BMI 40-49.9 (morbid obesity) (HCC) Complicating factor to overall prognosis and care Diet, exercise and weight reduction advised.    Out of bed to chair. Incentive spirometry. Nursing supportive care. Fall, aspiration precautions. DVT prophylaxis   Code Status: Full Code  Subjective: Patient is seen and examined today morning.  She is able to tolerate diet but has to be slow. Wishes to go home tomorrow. INT still 1.1 today.  Physical Exam: Vitals:   07/17/23 0253 07/17/23 0732 07/17/23 1126 07/17/23 1517  BP: (!) 173/88 (!) 150/82 (!) 181/86 (!) 151/84  Pulse: (!) 52 (!) 58 (!) 56 (!) 53  Resp: 18 16 16 16   Temp: 98.2 F (36.8 C) 98.2 F (36.8 C) 98.3 F (36.8 C) 98 F (36.7 C)  TempSrc: Oral Oral Oral Oral  SpO2: 94% 94% 98% 97%  Weight:      Height:        General - Elderly obese African-American female, no apparent distress, sitting HEENT - PERRLA, EOMI, atraumatic head, non tender sinuses. Lung -distant breath sounds, basal rales, rhonchi, no wheezes. Heart - S1, S2 heard, no murmurs, rubs, trace pedal edema. Abdomen - Soft, non tender, obese, bowel sounds good Neuro - Alert, awake  and oriented x 3, non focal exam. Skin - Warm and dry.  Data Reviewed:      Latest Ref  Rng & Units 07/16/2023    4:07 AM 07/15/2023    5:51 AM 07/14/2023    6:57 AM  CBC  WBC 4.0 - 10.5 K/uL 5.6  5.4  6.0   Hemoglobin 12.0 - 15.0 g/dL 87.8  88.2  87.9   Hematocrit 36.0 - 46.0 % 35.9  35.6  36.1   Platelets 150 - 400 K/uL 232  204  215       Latest Ref Rng & Units 07/17/2023    6:38 AM 07/16/2023    4:07 AM 07/14/2023    6:57 AM  BMP  Glucose 70 - 99 mg/dL 870  881  878   BUN 6 - 20 mg/dL 7  6  7    Creatinine 0.44 - 1.00 mg/dL 9.26  9.25  9.28   Sodium 135 - 145 mmol/L 134  137  139   Potassium 3.5 - 5.1 mmol/L 3.2  3.1  3.3   Chloride 98 - 111 mmol/L 103  99  105   CO2 22 - 32 mmol/L 23  24  27    Calcium  8.9 - 10.3 mg/dL 9.0  8.4  8.3    ECHOCARDIOGRAM COMPLETE Result Date: 07/16/2023    ECHOCARDIOGRAM REPORT   Patient Name:   Sarah Sosa Date of Exam: 07/16/2023 Medical Rec #:  [id number]     Height:       67.0 in Accession #:    [id number]    Weight:       272.0 lb Date of Birth:  [birth year]     BSA:          2.305 m Patient Age:    42 years      BP:           188/99 mmHg Patient Gender: F             HR:           54 bpm. Exam Location:  [facility] Procedure: 2D Echo, Cardiac Doppler and Color Doppler Indications:     Elevated Troponin  History:         Patient has prior history of Echocardiogram examinations, most                  recent 05/10/2017. CHF, Abnormal ECG; Risk Factors:Hypertension                  and Current Smoker. ETOH and Substance abuse, S/P Ascending                  Aortic graft. There is a mechanical valve in the Aortic                  position. Procedure done at [facility] 03/2022.  Sonographer:     Naomie Reef Referring Phys:  [id number] DELAYNE LULLA SOLIAN Diagnosing Phys: Dwayne D Callwood MD  Sonographer Comments: Patient is obese. IMPRESSIONS  1. Left ventricular ejection fraction, by estimation, is 70 to 75%. The left ventricle has hyperdynamic function. The left ventricle has no regional wall motion abnormalities. There is moderate concentric left  ventricular hypertrophy. Left ventricular diastolic parameters are consistent with Grade II diastolic dysfunction (pseudonormalization).  2. Right ventricular systolic function is normal. The right ventricular size is normal.  3. The mitral valve is normal in structure. Trivial mitral valve regurgitation.  4. The aortic valve is normal  in structure. Aortic valve regurgitation is not visualized. FINDINGS  Left Ventricle: Left ventricular ejection fraction, by estimation, is 70 to 75%. The left ventricle has hyperdynamic function. The left ventricle has no regional wall motion abnormalities. The left ventricular internal cavity size was normal in size. There is moderate concentric left ventricular hypertrophy. Left ventricular diastolic parameters are consistent with Grade II diastolic dysfunction (pseudonormalization). Right Ventricle: The right ventricular size is normal. No increase in right ventricular wall thickness. Right ventricular systolic function is normal. Left Atrium: Left atrial size was normal in size. Right Atrium: Right atrial size was normal in size. Pericardium: There is no evidence of pericardial effusion. Mitral Valve: The mitral valve is normal in structure. Trivial mitral valve regurgitation. MV peak gradient, 5.7 mmHg. The mean mitral valve gradient is 2.0 mmHg. Tricuspid Valve: The tricuspid valve is normal in structure. Tricuspid valve regurgitation is trivial. Aortic Valve: The aortic valve is normal in structure. Aortic valve regurgitation is not visualized. Aortic valve mean gradient measures 24.7 mmHg. Aortic valve peak gradient measures 45.0 mmHg. Aortic valve area, by VTI measures 1.61 cm. Pulmonic Valve: The pulmonic valve was normal in structure. Pulmonic valve regurgitation is not visualized. Aorta: The ascending aorta was not well visualized. IAS/Shunts: No atrial level shunt detected by color flow Doppler.  LEFT VENTRICLE PLAX 2D LVIDd:         4.90 cm   Diastology LVIDs:          2.90 cm   LV e' medial:    6.53 cm/s LV PW:         1.80 cm   LV E/e' medial:  13.7 LV IVS:        1.70 cm   LV e' lateral:   6.09 cm/s LVOT diam:     2.00 cm   LV E/e' lateral: 14.7 LV SV:         107 LV SV Index:   46 LVOT Area:     3.14 cm  RIGHT VENTRICLE RV Basal diam:  4.80 cm RV Mid diam:    3.80 cm RV S prime:     13.10 cm/s LEFT ATRIUM             Index        RIGHT ATRIUM           Index LA diam:        4.70 cm 2.04 cm/m   RA Area:     22.60 cm LA Vol (A2C):   55.7 ml 24.17 ml/m  RA Volume:   69.10 ml  29.98 ml/m LA Vol (A4C):   71.0 ml 30.81 ml/m LA Biplane Vol: 63.2 ml 27.42 ml/m  AORTIC VALVE                     PULMONIC VALVE AV Area (Vmax):    1.59 cm      PV Vmax:       1.21 m/s AV Area (Vmean):   1.44 cm      PV Peak grad:  5.9 mmHg AV Area (VTI):     1.61 cm AV Vmax:           335.33 cm/s AV Vmean:          231.000 cm/s AV VTI:            0.664 m AV Peak Grad:      45.0 mmHg AV Mean Grad:      24.7 mmHg LVOT Vmax:  169.50 cm/s LVOT Vmean:        106.000 cm/s LVOT VTI:          0.340 m LVOT/AV VTI ratio: 0.51  AORTA Ao Root diam: 3.50 cm Ao Asc diam:  3.00 cm MITRAL VALVE MV Area (PHT): 2.48 cm    SHUNTS MV Area VTI:   2.39 cm    Systemic VTI:  0.34 m MV Peak grad:  5.7 mmHg    Systemic Diam: 2.00 cm MV Mean grad:  2.0 mmHg MV Vmax:       1.19 m/s MV Vmean:      59.7 cm/s MV Decel Time: 306 msec MV E velocity: 89.60 cm/s MV A velocity: 71.80 cm/s MV E/A ratio:  1.25 Dwayne JONETTA Lovelace MD Electronically signed by Cara JONETTA Lovelace MD Signature Date/Time: 07/16/2023/6:20:23 PM    Final      Family Communication: Discussed with patient, she understand and agree. All questions answereed.   Disposition: Status is: Inpatient Remains inpatient appropriate because: Sub therapeutic INR, advance diet  Planned Discharge Destination: Home Plan to discharge tomorrow on Lovenox  bridging therapy.     Time spent: 37 minutes  Author: Concepcion Riser, MD 07/17/2023 4:49  PM Secure chat 7am to 7pm For on call review [email or web address].

## 2023-07-18 DIAGNOSIS — R112 Nausea with vomiting, unspecified: Secondary | ICD-10-CM | POA: Diagnosis not present

## 2023-07-18 DIAGNOSIS — K860 Alcohol-induced chronic pancreatitis: Secondary | ICD-10-CM

## 2023-07-18 DIAGNOSIS — R791 Abnormal coagulation profile: Secondary | ICD-10-CM | POA: Diagnosis not present

## 2023-07-18 DIAGNOSIS — R7989 Other specified abnormal findings of blood chemistry: Secondary | ICD-10-CM | POA: Diagnosis not present

## 2023-07-18 DIAGNOSIS — I160 Hypertensive urgency: Secondary | ICD-10-CM

## 2023-07-18 LAB — BASIC METABOLIC PANEL
Anion gap: 10 (ref 5–15)
BUN: 10 mg/dL (ref 6–20)
CO2: 21 mmol/L — ABNORMAL LOW (ref 22–32)
Calcium: 9 mg/dL (ref 8.9–10.3)
Chloride: 104 mmol/L (ref 98–111)
Creatinine, Ser: 0.77 mg/dL (ref 0.44–1.00)
GFR, Estimated: >60 mL/min (ref >60)
Glucose, Bld: 123 mg/dL — ABNORMAL HIGH (ref 70–99)
Potassium: 3.9 mmol/L (ref 3.5–5.1)
Sodium: 135 mmol/L (ref 135–145)

## 2023-07-18 LAB — PROTIME-INR
INR: 1.1 (ref 0.8–1.2)
Prothrombin Time: 14.1 s (ref 11.4–15.2)

## 2023-07-18 LAB — MAGNESIUM: Magnesium: 1.9 mg/dL (ref 1.7–2.4)

## 2023-07-18 MED ORDER — AMLODIPINE BESYLATE 10 MG PO TABS: 10.0000 mg | ORAL_TABLET | Freq: Every day | ORAL | 2 refills | Status: AC

## 2023-07-18 MED ORDER — WARFARIN SODIUM 10 MG PO TABS: 10.0000 mg | ORAL_TABLET | Freq: Once | ORAL | 0 refills | Status: DC

## 2023-07-18 MED ORDER — WARFARIN SODIUM 10 MG PO TABS
10.0000 mg | ORAL_TABLET | Freq: Once | ORAL | Status: DC
  Filled 2023-07-18: qty 1

## 2023-07-18 MED ORDER — LOSARTAN POTASSIUM 50 MG PO TABS: 50.0000 mg | ORAL_TABLET | Freq: Every day | ORAL | 2 refills | Status: AC

## 2023-07-18 MED ORDER — MAGNESIUM OXIDE -MG SUPPLEMENT 400 (240 MG) MG PO TABS: 400.0000 mg | ORAL_TABLET | Freq: Every day | ORAL | 0 refills | Status: AC

## 2023-07-18 MED ORDER — POTASSIUM CHLORIDE CRYS ER 20 MEQ PO TBCR: 40.0000 meq | EXTENDED_RELEASE_TABLET | Freq: Every day | ORAL | 0 refills | Status: DC

## 2023-07-18 MED ORDER — ENOXAPARIN SODIUM 120 MG/0.8ML IJ SOSY: 120.0000 mg | PREFILLED_SYRINGE | Freq: Two times a day (BID) | INTRAMUSCULAR | 0 refills | Status: DC

## 2023-07-18 NOTE — Plan of Care (Signed)
 IV removed, discharge instructions reviewed and patient to discharge to home

## 2023-07-18 NOTE — Progress Notes (Signed)
 PHARMACY CONSULT NOTE - FOLLOW UP  Pharmacy Consult for Electrolyte Monitoring and Replacement   Recent Labs: Potassium (mmol/L)  Date Value  07/18/2023 3.9   Magnesium  (mg/dL)  Date Value  98/98/7974 1.9   Calcium  (mg/dL)  Date Value  98/98/7974 9.0   Albumin (g/dL)  Date Value  90/96/7975 3.7   Phosphorus (mg/dL)  Date Value  94/86/7976 4.3   Sodium (mmol/L)  Date Value  07/18/2023 135    Assessment: 43 yo female w/ PMH of Chronic diastolic CHF, nonrheumatic aortic insufficiency s/p mechanical AVR at [facility] 03/2022 on Coumadin ,, HTN, ascending aortic aneurysm, asthma, hypertension, morbid obesity chronic pain on suboxone , methadone  and oxycodone  from the [facility] pain clinic, peripheral neuropathy and alcohol induced chronic pancreatitis presenting to ED abdominal and chest pain found with elevated troponin I level. Pt with hx of aortic valve replacement on warfarin at home.   Pertinent Medications:  1) magnesium  oxide 400 mg po once daily 2) KCl 40 mEq po once daily  Goal of Therapy:  Electrolytes WNL  Plan:  ---No replacement currently indicated ---recheck electrolytes in am  Rotha Cassels A Atticus Lemberger ,PharmD Clinical Pharmacist 07/18/2023 8:00 AM

## 2023-07-18 NOTE — Progress Notes (Signed)
 ANTICOAGULATION CONSULT NOTE  Pharmacy Consult for lovenox  bridge and warfarin  Indication:Aortic Mechanical Valve   (warfarin PTA)  Allergies  Allergen Reactions   Latex Anaphylaxis, Hives and Itching    Other reaction(s): Other (See Comments) SKIN BURNING & PEELING    Nsaids Other (See Comments)    Serve ulcers; stomach aches, GERD.  Other Reaction(s): Other (See Comments)   Tomato Anaphylaxis, Hives and Itching   Gabapentin  Palpitations    Has heart condition; also makes jittery   Metformin      Other Reaction(s): Abdominal Pain  Causes pancreatitis to worsen   Lisinopril      Other Reaction(s): Angioedema   Tramadol Itching and Hives   Duloxetine Anxiety    Makes her jittery    Patient Measurements: Height: 5' 7 (170.2 cm) Weight: 123.4 kg (272 lb) IBW/kg (Calculated) : 61.6 Heparin  Dosing Weight: 90.9 kg  Vital Signs: Temp: 98.4 F (36.9 C) (01/01 0439) Temp Source: Oral (12/31 2129) BP: 138/76 (01/01 0439) Pulse Rate: 60 (01/01 0439)  Labs: Recent Labs    07/16/23 0407 07/17/23 0638 07/18/23 0400  HGB 12.1  --   --   HCT 35.9*  --   --   PLT 232  --   --   LABPROT 13.9 14.4 14.1  INR 1.0 1.1 1.1  CREATININE 0.74 0.73 0.77    Estimated Creatinine Clearance: 126.1 mL/min (by C-G formula based on SCr of 0.77 mg/dL).   Medical History: Past Medical History:  Diagnosis Date   Arthritis    Asthma    Chronic pain disorder 03/19/2018   GERD (gastroesophageal reflux disease)    Headache    Heart murmur    History of trichomoniasis 03/19/2018   Hypertension    Intractable nausea and vomiting    Pancreatitis    PID (pelvic inflammatory disease) 02/14/2018   Uterine leiomyoma 03/19/2018    Assessment: Pt is a 43 yo female presenting to ED abdominal and chest pain found with elevated troponin I level.  Pt with hx of aortic valve replacement on warfarin at home.  INR at admission 1.1. INR likely below goal given 3-day history of abdominal pain,  1-day history of vomiting, and inability to keep her meds down. Pharmacy was consulted to re-start home warfarin.  Warfarin home dose:  warfarin 7.5 mg on Tuesdays and Thursdays and 3.75 mg (1/2 tablet) all other days (TWD 33.75 mg).  Baseline INR: 1.3   Warfarin assessment: Date INR Warfarin Dose  12/27 1.3 5 mg (ordered)  12/28 1.1 5 mg  12/29 1.0 7.5 mg  12/30 1.0 10 mg  12/31 1.1 10 mg  01/01 1.1 10 mg    Goal of Therapy:  INR goal 1.5-2.0- per chart review from anti-coag visit 07/09/23 Monitor platelets by anticoagulation protocol: Yes   Lovenox  Plan:  Will continue  therapeutic lovenox  1 mg/kg (120 mg) q12h to bridge until INR therapeutic  (per [facility] anticoagulation clinic visit note 07/09/23:  INR goal 1.5 to 2.0) Continue to monitor CBC daily  Warfarin Plan:  INR remains subtherapeutic Will order warfarin 10 mg x 1  today  Continue to monitor INR daily   Blakleigh Straw A Mane Consolo, PharmD Clinical Pharmacist 07/18/2023 8:01 AM

## 2023-07-18 NOTE — Discharge Summary (Signed)
 Physician Discharge Summary   Patient name: Sarah Sosa  Admit date:     07/12/2023  Discharge date: 07/18/2023  Attending Physician: CLEATUS DELAYNE GAILS [[id number]]  Discharge Physician: Concepcion Riser   PCP: Clinic, [facility]    Follow-up Information     Georgina Camellia RAMAN. Go in 2 week(s).   Contact information: [address] [place] [address] [phone]                 Recommendations at discharge:  INR check in 3 days and follow Coumadin  instructions. Cardiology follow up as scheduled. PCP follow up in 1 week.  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 [name]   Sarah Sosa is a 43 y.o. female with medical history significant for Chronic diastolic CHF, nonrheumatic aortic insufficiency s/p mechanical AVR at [facility] 03/2022 on Coumadin ,, HTN, ascending aortic aneurysm, asthma, hypertension, morbid obesity chronic pain on suboxone , methadone  and oxycodone  from the [facility] pain clinic, peripheral neuropathy and alcohol induced chronic pancreatitis, who presents to the ED with a 3-day history of abdominal pain typical for her pancreatitis and a 1 day history of vomiting.     Patient is admitted to the hospitalist service for further management evaluation of community-acquired pneumonia, acute on chronic alcoholic pancreatitis,  elevated troponin, electrolyte abnormalities, subtherapeutic INR.  Assessment and Plan: * Elevated troponin Type II MI In the setting of intractable pain with vomiting as well as hypertensive urgency Troponin remain flat 117-114. Echo unremarkable. Outpatient cardiology follow-up suggested. Continue Coumadin  10MG  with lovenox  bridging for next 3 days and follow INR clinic. Hold Coumadin  home dose until instructed.   CAP (community acquired pneumonia) Rocephin  and azithromycin  discontinued. Continue antitussives. Her symptoms improved, no hypoxia.   Intractable nausea and vomiting Acute on chronic alcoholic pancreatitis Patient with pain typical for her to pancreatitis, she is able to tolerate oral meds and regular diet. Advised zofran  ODT.   Hypomagnesemia Hypokalemia Potassium oral supplements. Outpatient PCP follow up.   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  Goal INR 1.5-2 per [facility] INR clinic. Continue Lovenox  bridging and daily 10mg  Coumadin . Echo show EF 70-75%, valve appears to be functioning well.  No further work up, outpatient cardiology follow up. I advised her to take coumadin  10mg  and Lovenox  shots at home for 3 more days until INR is therapeutic and advised to get INR checked in 3 days. She is advised to hold home dose coumadin  until INR check.   Hypertensive urgency BP improved. Continue Losartan  and Norvasc . Continue Lasix , spironolactone .   (HFpEF) heart failure with preserved ejection fraction (HCC) Clinically euvolemic. Continue home dose spironolactone  and Lasix    History of heavy alcohol consumption In remission. Cessation of alcohol advised.   Chronic use of opiate drug for therapeutic purpose Oral home pain meds resumed   Obesity, Class III,  BMI 40-49.9 (morbid obesity) (HCC) Complicating factor to overall prognosis and care Diet, exercise and weight reduction  advised.   Procedures performed: none   Condition at discharge: stable  Exam    07/18/2023    8:32 AM 07/18/2023    4:39 AM 07/17/2023    9:29 PM  Vitals with BMI  Systolic 134 138 830  Diastolic 76 76 108  Pulse 62 60 65    General - Elderly obese African-American female, no apparent distress, sitting HEENT - PERRLA, EOMI, atraumatic head, non tender sinuses. Lung -distant breath sounds, basal rales, rhonchi, no wheezes. Heart - S1, S2 heard, no murmurs, rubs, trace pedal edema. Abdomen - Soft, non tender, obese, bowel sounds good Neuro - Alert, awake and oriented x 3, non focal exam. Skin - Warm and dry.   Disposition: Home   Allergies as of 07/18/2023       Reactions   Latex Anaphylaxis, Hives, Itching   Other reaction(s): Other (See Comments) SKIN BURNING & PEELING   Nsaids Other (See Comments)   Serve ulcers; stomach aches, GERD. Other Reaction(s): Other (See Comments)   Tomato Anaphylaxis, Hives, Itching   Gabapentin  Palpitations   Has heart condition; also makes jittery   Metformin     Other Reaction(s): Abdominal Pain Causes pancreatitis to worsen   Lisinopril     Other Reaction(s): Angioedema   Tramadol Itching, Hives   Duloxetine Anxiety   Makes her jittery        Medication List     PAUSE taking these medications    warfarin 7.5 MG tablet Wait to take this until your doctor or other care provider tells you to start again. Commonly known as: COUMADIN  Take 3.25-7.5 mg by mouth daily. Take 3.25mg   M/W/F/S/Sun and T/Th 7.5mg  (1 tab) You also have another medication with the same name that you may need to continue taking.       TAKE these medications    amitriptyline  10 MG tablet Commonly known as: ELAVIL  Take 10 mg by mouth 3 (three) times daily as needed.   amLODipine  10 MG tablet Commonly known as: NORVASC  Take 1 tablet (10 mg total) by mouth daily.   APPLE CIDER VINEGAR PO Take 1 capsule by mouth 3 (three) times daily.   aspirin  81 MG  chewable tablet Chew 81 mg by mouth daily.   atorvastatin  40 MG tablet Commonly known as: LIPITOR Take 40 mg by mouth daily.   Buprenorphine  HCl-Naloxone  HCl 8-2 MG Film Place 1 Film under the tongue 4 (four) times daily as needed.   enoxaparin  120 MG/0.8ML injection Commonly known as: LOVENOX  Inject 0.8 mLs (120 mg total) into the skin 2 (two) times daily for 4 days.   losartan  50 MG tablet Commonly known as: COZAAR  Take 1 tablet (50 mg total) by mouth daily.   magnesium  oxide 400 (240 Mg) MG tablet Commonly known as: MAG-OX Take 1 tablet (400 mg total) by mouth daily for 10 days.   metFORMIN  500 MG tablet Commonly known as: GLUCOPHAGE  Take 500 mg by mouth every morning.   methadone  10 MG tablet Commonly known as: DOLOPHINE  Take 10 mg by mouth 4 (four) times daily.   naloxone  4 MG/0.1ML Liqd nasal spray kit Commonly known as: NARCAN  Place 1 spray into the nose once as needed (to reverse overdose).   omega-3 acid ethyl esters 1 g capsule Commonly known as: LOVAZA  Take 1 g by mouth 2 (two) times daily.   omeprazole 40 MG capsule Commonly known as: PRILOSEC Take  40 mg by mouth daily.   Oxycodone  HCl 10 MG Tabs Take 1 tablet (10 mg total) by mouth every 6 (six) hours as needed.   potassium chloride  SA 20 MEQ tablet Commonly known as: KLOR-CON  M Take 2 tablets (40 mEq total) by mouth daily.   spironolactone  25 MG tablet Commonly known as: ALDACTONE  Take 25 mg by mouth daily.   traZODone  50 MG tablet Commonly known as: DESYREL  Take 100 mg by mouth at bedtime.   warfarin 10 MG tablet Commonly known as: COUMADIN  Take 1 tablet (10 mg total) by mouth one time only at 4 PM for 5 days. What changed: Another medication with the same name was paused. Ask your nurse or doctor if you should take this medication.        ECHOCARDIOGRAM COMPLETE Result Date: 07/16/2023    ECHOCARDIOGRAM REPORT   Patient Name:   Sarah Sosa Date of Exam: 07/16/2023 Medical Rec #:   [id number]     Height:       67.0 in Accession #:    [id number]    Weight:       272.0 lb Date of Birth:  11-09-1980     BSA:          2.305 m Patient Age:    42 years      BP:           188/99 mmHg Patient Gender: F             HR:           54 bpm. Exam Location:  [facility] Procedure: 2D Echo, Cardiac Doppler and Color Doppler Indications:     Elevated Troponin  History:         Patient has prior history of Echocardiogram examinations, most                  recent 05/10/2017. CHF, Abnormal ECG; Risk Factors:Hypertension                  and Current Smoker. ETOH and Substance abuse, S/P Ascending                  Aortic graft. There is a mechanical valve in the Aortic                  position. Procedure done at [facility] 03/2022.  Sonographer:     Naomie Reef Referring Phys:  [id number] DELAYNE LULLA SOLIAN Diagnosing Phys: Dwayne D Callwood MD  Sonographer Comments: Patient is obese. IMPRESSIONS  1. Left ventricular ejection fraction, by estimation, is 70 to 75%. The left ventricle has hyperdynamic function. The left ventricle has no regional wall motion abnormalities. There is moderate concentric left ventricular hypertrophy. Left ventricular diastolic parameters are consistent with Grade II diastolic dysfunction (pseudonormalization).  2. Right ventricular systolic function is normal. The right ventricular size is normal.  3. The mitral valve is normal in structure. Trivial mitral valve regurgitation.  4. The aortic valve is normal in structure. Aortic valve regurgitation is not visualized. FINDINGS  Left Ventricle: Left ventricular ejection fraction, by estimation, is 70 to 75%. The left ventricle has hyperdynamic function. The left ventricle has no regional wall motion abnormalities. The left ventricular internal cavity size was normal in size. There is moderate concentric left ventricular hypertrophy. Left ventricular diastolic parameters are consistent with Grade II diastolic dysfunction (pseudonormalization). Right  Ventricle: The right ventricular size is normal. No increase in right ventricular wall thickness. Right ventricular systolic function  is normal. Left Atrium: Left atrial size was normal in size. Right Atrium: Right atrial size was normal in size. Pericardium: There is no evidence of pericardial effusion. Mitral Valve: The mitral valve is normal in structure. Trivial mitral valve regurgitation. MV peak gradient, 5.7 mmHg. The mean mitral valve gradient is 2.0 mmHg. Tricuspid Valve: The tricuspid valve is normal in structure. Tricuspid valve regurgitation is trivial. Aortic Valve: The aortic valve is normal in structure. Aortic valve regurgitation is not visualized. Aortic valve mean gradient measures 24.7 mmHg. Aortic valve peak gradient measures 45.0 mmHg. Aortic valve area, by VTI measures 1.61 cm. Pulmonic Valve: The pulmonic valve was normal in structure. Pulmonic valve regurgitation is not visualized. Aorta: The ascending aorta was not well visualized. IAS/Shunts: No atrial level shunt detected by color flow Doppler.  LEFT VENTRICLE PLAX 2D LVIDd:         4.90 cm   Diastology LVIDs:         2.90 cm   LV e' medial:    6.53 cm/s LV PW:         1.80 cm   LV E/e' medial:  13.7 LV IVS:        1.70 cm   LV e' lateral:   6.09 cm/s LVOT diam:     2.00 cm   LV E/e' lateral: 14.7 LV SV:         107 LV SV Index:   46 LVOT Area:     3.14 cm  RIGHT VENTRICLE RV Basal diam:  4.80 cm RV Mid diam:    3.80 cm RV S prime:     13.10 cm/s LEFT ATRIUM             Index        RIGHT ATRIUM           Index LA diam:        4.70 cm 2.04 cm/m   RA Area:     22.60 cm LA Vol (A2C):   55.7 ml 24.17 ml/m  RA Volume:   69.10 ml  29.98 ml/m LA Vol (A4C):   71.0 ml 30.81 ml/m LA Biplane Vol: 63.2 ml 27.42 ml/m  AORTIC VALVE                     PULMONIC VALVE AV Area (Vmax):    1.59 cm      PV Vmax:       1.21 m/s AV Area (Vmean):   1.44 cm      PV Peak grad:  5.9 mmHg AV Area (VTI):     1.61 cm AV Vmax:           335.33 cm/s AV  Vmean:          231.000 cm/s AV VTI:            0.664 m AV Peak Grad:      45.0 mmHg AV Mean Grad:      24.7 mmHg LVOT Vmax:         169.50 cm/s LVOT Vmean:        106.000 cm/s LVOT VTI:          0.340 m LVOT/AV VTI ratio: 0.51  AORTA Ao Root diam: 3.50 cm Ao Asc diam:  3.00 cm MITRAL VALVE MV Area (PHT): 2.48 cm    SHUNTS MV Area VTI:   2.39 cm    Systemic VTI:  0.34 m MV Peak grad:  5.7 mmHg  Systemic Diam: 2.00 cm MV Mean grad:  2.0 mmHg MV Vmax:       1.19 m/s MV Vmean:      59.7 cm/s MV Decel Time: 306 msec MV E velocity: 89.60 cm/s MV A velocity: 71.80 cm/s MV E/A ratio:  1.25 Cara JONETTA Lovelace MD Electronically signed by Cara JONETTA Lovelace MD Signature Date/Time: 07/16/2023/6:20:23 PM    Final    CT Angio Chest PE W and/or Wo Contrast Result Date: 07/13/2023 CLINICAL DATA:  chest pain, elevated trops. eval PE. hx AVR; epigastric pain, emesis. hx chronic pancreatitis EXAM: CT ANGIOGRAPHY CHEST CT ABDOMEN AND PELVIS WITH CONTRAST TECHNIQUE: Multidetector CT imaging of the chest was performed using the standard protocol during bolus administration of intravenous contrast. Multiplanar CT image reconstructions and MIPs were obtained to evaluate the vascular anatomy. Multidetector CT imaging of the abdomen and pelvis was performed using the standard protocol during bolus administration of intravenous contrast. RADIATION DOSE REDUCTION: This exam was performed according to the departmental dose-optimization program which includes automated exposure control, adjustment of the mA and/or kV according to patient size and/or use of iterative reconstruction technique. CONTRAST:  OMNIPAQUE  IOHEXOL  350 MG/ML SOLN COMPARISON:  CT abdomen pelvis 03/11/2023 CT angio chest abdomen pelvis 08/24/2022 FINDINGS: CTA CHEST FINDINGS Cardiovascular: Satisfactory opacification of the pulmonary arteries to the segmental level. No evidence of pulmonary embolism. The main pulmonary artery is normal in caliber. Normal heart  size. No significant pericardial effusion. The thoracic aorta is normal in caliber. No atherosclerotic plaque of the thoracic aorta. No coronary artery calcifications. Aortic valve replacement. Mediastinum/Nodes: Interval resolution of anterior mediastinal soft tissue density. Interval development of borderline enlarged mediastinal and inter lobar lymph nodes measuring up to 1 cm (2:36 737). No enlarged hilar or axillary lymph nodes. Thyroid gland, trachea, and esophagus demonstrate no significant findings. Lungs/Pleura: Developing bilateral lower lobe and lingular patchy peribronchovascular ground-glass airspace opacities. No pulmonary nodule. No pulmonary mass. No pleural effusion. No pneumothorax. Musculoskeletal: No chest wall abnormality. No suspicious lytic or blastic osseous lesions. No acute displaced fracture. Multilevel degenerative changes of the spine. Intact sternotomy wires. Review of the MIP images confirms the above findings. CT ABDOMEN and PELVIS FINDINGS Hepatobiliary: No focal liver abnormality. Status post cholecystectomy. No biliary dilatation. Pancreas: No focal lesion. Normal pancreatic contour. No surrounding inflammatory changes. No main pancreatic ductal dilatation. Spleen: Normal in size without focal abnormality. Adrenals/Urinary Tract: No adrenal nodule bilaterally. Bilateral kidneys enhance symmetrically. No hydronephrosis. No hydroureter. The urinary bladder is unremarkable. Stomach/Bowel: Stomach is within normal limits. No evidence of bowel wall thickening or dilatation. Nonspecific persistent small bowel mesentery swirling (4:61). Appendix appears normal. Vascular/Lymphatic: No abdominal aorta or iliac aneurysm. Mild atherosclerotic plaque of the aorta and its branches. No abdominal, pelvic, or inguinal lymphadenopathy. Reproductive: Left corpus luteum cyst-no further follow-up indicated. Interval development of a 3.7 x 3.1 cm right ovarian cystic lesion-no further follow-up  indicated. Status post hysterectomy. Other: Nonspecific trace pelvic intraperitoneal simple free fluid. No intraperitoneal free gas. No organized fluid collection. Musculoskeletal: Tiny fat containing supraumbilical ventral hernia (4:18). No suspicious lytic or blastic osseous lesions. No acute displaced fracture. Multilevel degenerative changes of the spine. Review of the MIP images confirms the above findings. IMPRESSION: 1. No pulmonary embolus. 2. Developing bilateral lower lobe and lingular patchy peribronchovascular ground-glass airspace opacities. Findings could represent developing infection/inflammation. 3. Interval development of nonspecific borderline enlarged mediastinal and interlobar lymph nodes likely reactive in etiology. Recommend attention on follow. 4. No acute intra-abdominal or  intrapelvic abnormality. 5. Status post hysterectomy. 6.  Aortic Atherosclerosis (ICD10-I70.0). Electronically Signed   By: Morgane  Naveau M.D.   On: 07/13/2023 01:40   CT ABDOMEN PELVIS W CONTRAST Result Date: 07/13/2023 CLINICAL DATA:  chest pain, elevated trops. eval PE. hx AVR; epigastric pain, emesis. hx chronic pancreatitis EXAM: CT ANGIOGRAPHY CHEST CT ABDOMEN AND PELVIS WITH CONTRAST TECHNIQUE: Multidetector CT imaging of the chest was performed using the standard protocol during bolus administration of intravenous contrast. Multiplanar CT image reconstructions and MIPs were obtained to evaluate the vascular anatomy. Multidetector CT imaging of the abdomen and pelvis was performed using the standard protocol during bolus administration of intravenous contrast. RADIATION DOSE REDUCTION: This exam was performed according to the departmental dose-optimization program which includes automated exposure control, adjustment of the mA and/or kV according to patient size and/or use of iterative reconstruction technique. CONTRAST:  OMNIPAQUE  IOHEXOL  350 MG/ML SOLN COMPARISON:  CT abdomen pelvis 03/11/2023 CT  angio chest abdomen pelvis 08/24/2022 FINDINGS: CTA CHEST FINDINGS Cardiovascular: Satisfactory opacification of the pulmonary arteries to the segmental level. No evidence of pulmonary embolism. The main pulmonary artery is normal in caliber. Normal heart size. No significant pericardial effusion. The thoracic aorta is normal in caliber. No atherosclerotic plaque of the thoracic aorta. No coronary artery calcifications. Aortic valve replacement. Mediastinum/Nodes: Interval resolution of anterior mediastinal soft tissue density. Interval development of borderline enlarged mediastinal and inter lobar lymph nodes measuring up to 1 cm (2:36 737). No enlarged hilar or axillary lymph nodes. Thyroid gland, trachea, and esophagus demonstrate no significant findings. Lungs/Pleura: Developing bilateral lower lobe and lingular patchy peribronchovascular ground-glass airspace opacities. No pulmonary nodule. No pulmonary mass. No pleural effusion. No pneumothorax. Musculoskeletal: No chest wall abnormality. No suspicious lytic or blastic osseous lesions. No acute displaced fracture. Multilevel degenerative changes of the spine. Intact sternotomy wires. Review of the MIP images confirms the above findings. CT ABDOMEN and PELVIS FINDINGS Hepatobiliary: No focal liver abnormality. Status post cholecystectomy. No biliary dilatation. Pancreas: No focal lesion. Normal pancreatic contour. No surrounding inflammatory changes. No main pancreatic ductal dilatation. Spleen: Normal in size without focal abnormality. Adrenals/Urinary Tract: No adrenal nodule bilaterally. Bilateral kidneys enhance symmetrically. No hydronephrosis. No hydroureter. The urinary bladder is unremarkable. Stomach/Bowel: Stomach is within normal limits. No evidence of bowel wall thickening or dilatation. Nonspecific persistent small bowel mesentery swirling (4:61). Appendix appears normal. Vascular/Lymphatic: No abdominal aorta or iliac aneurysm. Mild  atherosclerotic plaque of the aorta and its branches. No abdominal, pelvic, or inguinal lymphadenopathy. Reproductive: Left corpus luteum cyst-no further follow-up indicated. Interval development of a 3.7 x 3.1 cm right ovarian cystic lesion-no further follow-up indicated. Status post hysterectomy. Other: Nonspecific trace pelvic intraperitoneal simple free fluid. No intraperitoneal free gas. No organized fluid collection. Musculoskeletal: Tiny fat containing supraumbilical ventral hernia (4:18). No suspicious lytic or blastic osseous lesions. No acute displaced fracture. Multilevel degenerative changes of the spine. Review of the MIP images confirms the above findings. IMPRESSION: 1. No pulmonary embolus. 2. Developing bilateral lower lobe and lingular patchy peribronchovascular ground-glass airspace opacities. Findings could represent developing infection/inflammation. 3. Interval development of nonspecific borderline enlarged mediastinal and interlobar lymph nodes likely reactive in etiology. Recommend attention on follow. 4. No acute intra-abdominal or intrapelvic abnormality. 5. Status post hysterectomy. 6.  Aortic Atherosclerosis (ICD10-I70.0). Electronically Signed   By: Morgane  Naveau M.D.   On: 07/13/2023 01:40   DG Chest 2 View Result Date: 07/12/2023 CLINICAL DATA:  Chest pain EXAM: CHEST - 2 VIEW COMPARISON:  03/20/2023  FINDINGS: Prior median sternotomy and valve replacement. Heart is borderline in size. Lungs clear. No effusions or edema. No acute bony abnormality IMPRESSION: No active cardiopulmonary disease. Electronically Signed   By: Franky Crease M.D.   On: 07/12/2023 20:10   Results for orders placed or performed during the hospital encounter of 03/20/23  MRSA Next Gen by PCR, Nasal     Status: None   Collection Time: 03/20/23  2:39 PM   Specimen: Nasal Mucosa; Nasal Swab  Result Value Ref Range Status   MRSA by PCR Next Gen NOT DETECTED NOT DETECTED Final    Comment: (NOTE) The  GeneXpert MRSA Assay (FDA approved for NASAL specimens only), is one component of a comprehensive MRSA colonization surveillance program. It is not intended to diagnose MRSA infection nor to guide or monitor treatment for MRSA infections. Test performance is not FDA approved in patients less than 88 years old. Performed at [facility] [address] [place], [place] [address]    Discharge time: 36 minutes.   Signed:  Concepcion Riser MD.  [facility] 07/20/2023, 12:44 PM

## 2023-08-03 ENCOUNTER — Other Ambulatory Visit: Payer: Self-pay

## 2023-08-03 ENCOUNTER — Encounter: Payer: Self-pay | Admitting: Emergency Medicine

## 2023-08-03 ENCOUNTER — Inpatient Hospital Stay
Admission: EM | Admit: 2023-08-03 | Discharge: 2023-08-13 | DRG: 439 | Disposition: A | Discharge status: Home / Self Care | Payer: Medicaid Other | Attending: Internal Medicine | Admitting: Internal Medicine

## 2023-08-03 DIAGNOSIS — I10 Essential (primary) hypertension: Secondary | ICD-10-CM | POA: Diagnosis present

## 2023-08-03 DIAGNOSIS — Z6841 Body Mass Index (BMI) 40.0 and over, adult: Secondary | ICD-10-CM

## 2023-08-03 DIAGNOSIS — G8929 Other chronic pain: Secondary | ICD-10-CM | POA: Diagnosis present

## 2023-08-03 DIAGNOSIS — Z8261 Family history of arthritis: Secondary | ICD-10-CM | POA: Diagnosis not present

## 2023-08-03 DIAGNOSIS — E876 Hypokalemia: Secondary | ICD-10-CM | POA: Diagnosis present

## 2023-08-03 DIAGNOSIS — Z1152 Encounter for screening for COVID-19: Secondary | ICD-10-CM | POA: Diagnosis not present

## 2023-08-03 DIAGNOSIS — E869 Volume depletion, unspecified: Secondary | ICD-10-CM | POA: Diagnosis present

## 2023-08-03 DIAGNOSIS — Z90710 Acquired absence of both cervix and uterus: Secondary | ICD-10-CM | POA: Diagnosis not present

## 2023-08-03 DIAGNOSIS — K859 Acute pancreatitis without necrosis or infection, unspecified: Secondary | ICD-10-CM | POA: Diagnosis present

## 2023-08-03 DIAGNOSIS — Z7982 Long term (current) use of aspirin: Secondary | ICD-10-CM

## 2023-08-03 DIAGNOSIS — E871 Hypo-osmolality and hyponatremia: Secondary | ICD-10-CM | POA: Diagnosis present

## 2023-08-03 DIAGNOSIS — K219 Gastro-esophageal reflux disease without esophagitis: Secondary | ICD-10-CM | POA: Diagnosis present

## 2023-08-03 DIAGNOSIS — Z7901 Long term (current) use of anticoagulants: Secondary | ICD-10-CM | POA: Diagnosis not present

## 2023-08-03 DIAGNOSIS — J45909 Unspecified asthma, uncomplicated: Secondary | ICD-10-CM | POA: Diagnosis present

## 2023-08-03 DIAGNOSIS — Z885 Allergy status to narcotic agent status: Secondary | ICD-10-CM

## 2023-08-03 DIAGNOSIS — Z952 Presence of prosthetic heart valve: Secondary | ICD-10-CM | POA: Diagnosis not present

## 2023-08-03 DIAGNOSIS — Z888 Allergy status to other drugs, medicaments and biological substances status: Secondary | ICD-10-CM | POA: Diagnosis not present

## 2023-08-03 DIAGNOSIS — F17210 Nicotine dependence, cigarettes, uncomplicated: Secondary | ICD-10-CM | POA: Diagnosis present

## 2023-08-03 DIAGNOSIS — Z7984 Long term (current) use of oral hypoglycemic drugs: Secondary | ICD-10-CM

## 2023-08-03 DIAGNOSIS — M1990 Unspecified osteoarthritis, unspecified site: Secondary | ICD-10-CM | POA: Diagnosis present

## 2023-08-03 DIAGNOSIS — K860 Alcohol-induced chronic pancreatitis: Secondary | ICD-10-CM | POA: Diagnosis present

## 2023-08-03 DIAGNOSIS — Z79899 Other long term (current) drug therapy: Secondary | ICD-10-CM | POA: Diagnosis not present

## 2023-08-03 DIAGNOSIS — Z91040 Latex allergy status: Secondary | ICD-10-CM

## 2023-08-03 DIAGNOSIS — Z9049 Acquired absence of other specified parts of digestive tract: Secondary | ICD-10-CM | POA: Diagnosis not present

## 2023-08-03 DIAGNOSIS — E66813 Obesity, class 3: Secondary | ICD-10-CM | POA: Diagnosis present

## 2023-08-03 DIAGNOSIS — E669 Obesity, unspecified: Secondary | ICD-10-CM | POA: Diagnosis present

## 2023-08-03 DIAGNOSIS — Z8249 Family history of ischemic heart disease and other diseases of the circulatory system: Secondary | ICD-10-CM | POA: Diagnosis not present

## 2023-08-03 DIAGNOSIS — K852 Alcohol induced acute pancreatitis without necrosis or infection: Secondary | ICD-10-CM | POA: Diagnosis present

## 2023-08-03 DIAGNOSIS — Z886 Allergy status to analgesic agent status: Secondary | ICD-10-CM

## 2023-08-03 DIAGNOSIS — Z9109 Other allergy status, other than to drugs and biological substances: Secondary | ICD-10-CM

## 2023-08-03 LAB — COMPREHENSIVE METABOLIC PANEL
ALT: 75 U/L — ABNORMAL HIGH (ref 0–44)
AST: 33 U/L (ref 15–41)
Albumin: 4.4 g/dL (ref 3.5–5.0)
Alkaline Phosphatase: 81 U/L (ref 38–126)
Anion gap: 15 (ref 5–15)
BUN: 13 mg/dL (ref 6–20)
CO2: 22 mmol/L (ref 22–32)
Calcium: 9.3 mg/dL (ref 8.9–10.3)
Chloride: 95 mmol/L — ABNORMAL LOW (ref 98–111)
Creatinine, Ser: 0.87 mg/dL (ref 0.44–1.00)
GFR, Estimated: >60 mL/min (ref >60)
Glucose, Bld: 156 mg/dL — ABNORMAL HIGH (ref 70–99)
Potassium: 3.2 mmol/L — ABNORMAL LOW (ref 3.5–5.1)
Sodium: 132 mmol/L — ABNORMAL LOW (ref 135–145)
Total Bilirubin: 1.1 mg/dL (ref 0.0–1.2)
Total Protein: 8 g/dL (ref 6.5–8.1)

## 2023-08-03 LAB — LIPASE, BLOOD: Lipase: 346 U/L — ABNORMAL HIGH (ref 11–51)

## 2023-08-03 LAB — CBC
HCT: 45 % (ref 36.0–46.0)
Hemoglobin: 15 g/dL (ref 12.0–15.0)
MCH: 30 pg (ref 26.0–34.0)
MCHC: 33.3 g/dL (ref 30.0–36.0)
MCV: 90 fL (ref 80.0–100.0)
Platelets: 307 10*3/uL (ref 150–400)
RBC: 5 MIL/uL (ref 3.87–5.11)
RDW: 13.3 % (ref 11.5–15.5)
WBC: 10 10*3/uL (ref 4.0–10.5)
nRBC: 0 % (ref 0.0–0.2)

## 2023-08-03 LAB — RESP PANEL BY RT-PCR (RSV, FLU A&B, COVID)  RVPGX2
Influenza A by PCR: NEGATIVE
Influenza B by PCR: NEGATIVE
Resp Syncytial Virus by PCR: NEGATIVE
SARS Coronavirus 2 by RT PCR: NEGATIVE

## 2023-08-03 MED ORDER — ALBUTEROL SULFATE (2.5 MG/3ML) 0.083% IN NEBU: 2.5000 mg | INHALATION_SOLUTION | RESPIRATORY_TRACT | Status: DC | PRN

## 2023-08-03 MED ORDER — POTASSIUM CHLORIDE 10 MEQ/100ML IV SOLN
10.0000 meq | INTRAVENOUS | Status: AC
  Administered 2023-08-03 (×4): 10 meq via INTRAVENOUS
  Filled 2023-08-03: qty 100

## 2023-08-03 MED ORDER — MORPHINE SULFATE (PF) 4 MG/ML IV SOLN
4.0000 mg | Freq: Once | INTRAVENOUS | Status: AC
  Administered 2023-08-03: 4 mg via INTRAVENOUS
  Filled 2023-08-03: qty 1

## 2023-08-03 MED ORDER — PANTOPRAZOLE SODIUM 40 MG IV SOLR
40.0000 mg | INTRAVENOUS | Status: DC
  Administered 2023-08-03 – 2023-08-04 (×2): 40 mg via INTRAVENOUS
  Filled 2023-08-03 (×2): qty 10

## 2023-08-03 MED ORDER — ONDANSETRON 4 MG PO TBDP
4.0000 mg | ORAL_TABLET | Freq: Once | ORAL | Status: AC
  Administered 2023-08-03: 4 mg via ORAL

## 2023-08-03 MED ORDER — HYDROMORPHONE HCL 1 MG/ML IJ SOLN
1.0000 mg | INTRAMUSCULAR | Status: DC | PRN
  Administered 2023-08-03 – 2023-08-05 (×12): 1 mg via INTRAVENOUS
  Filled 2023-08-03 (×12): qty 1

## 2023-08-03 MED ORDER — SODIUM CHLORIDE 0.9 % IV SOLN: Freq: Once | INTRAVENOUS | Status: AC

## 2023-08-03 MED ORDER — ENOXAPARIN SODIUM 60 MG/0.6ML IJ SOSY
0.5000 mg/kg | PREFILLED_SYRINGE | INTRAMUSCULAR | Status: DC
  Administered 2023-08-03 – 2023-08-05 (×3): 62.5 mg via SUBCUTANEOUS
  Filled 2023-08-03 (×3): qty 1.2

## 2023-08-03 MED ORDER — ONDANSETRON HCL 4 MG/2ML IJ SOLN
4.0000 mg | Freq: Four times a day (QID) | INTRAMUSCULAR | Status: DC | PRN
  Administered 2023-08-03 – 2023-08-12 (×6): 4 mg via INTRAVENOUS
  Filled 2023-08-03 (×6): qty 2

## 2023-08-03 MED ORDER — SODIUM CHLORIDE 0.9 % IV BOLUS
2000.0000 mL | Freq: Once | INTRAVENOUS | Status: AC
  Administered 2023-08-03: 2000 mL via INTRAVENOUS

## 2023-08-03 NOTE — H&P (Addendum)
History and Physical    Patient: Sarah Sosa ZOX:[id number] DOB: 01/28/1981 DOA: 08/03/2023 DOS: the patient was seen and examined on 08/03/2023 PCP: [facility]  Patient coming from: Home  Chief Complaint: Abdominal pain Chief Complaint  Patient presents with   Abdominal Pain   HPI: Sarah Sosa is a 43 y.o. 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  According to patient she last used alcohol 2 days ago.  According to her the pain is so severe that she finds it difficult to tolerate any oral intake.  Denies nausea or vomiting, chest pain, cough or urinary complaint. ED course: Temperature 97.9 respiratory rate 22 pulse 97 blood pressure 08/12/1977 saturating 100% on room air.  Labs significant for lipase 346. IV fluids as well as pain medication initiated and hospitalist service called to admit patient for further management. Review of Systems: As mentioned in the history of present illness. All other systems reviewed and are negative. Past Medical History:  Diagnosis Date   Arthritis    Asthma    Chronic pain disorder 03/19/2018   GERD (gastroesophageal reflux disease)    Headache    Heart murmur    History of trichomoniasis 03/19/2018   Hypertension    Intractable nausea and vomiting    Pancreatitis    PID (pelvic inflammatory disease) 02/14/2018   Uterine leiomyoma 03/19/2018   Past Surgical History:  Procedure Laterality Date   ABDOMINAL HYSTERECTOMY     AORTIC VALVE REPLACEMENT  03/2022   CHOLECYSTECTOMY     DENTAL SURGERY     HYSTERECTOMY ABDOMINAL WITH SALPINGECTOMY Bilateral 04/15/2018   Procedure: HYSTERECTOMY ABDOMINAL WITH BILATERAL SALPINGECTOMY;  Surgeon: Hildred Laser, MD;  Location: [facility] ORS;  Service: Gynecology;   Laterality: Bilateral;   kenn surgery Bilateral    KNEE ARTHROSCOPY Left 2012, 2013   Social History:  reports that she has been smoking cigarettes. She has a 11 pack-year smoking history. She has never used smokeless tobacco. She reports that she does not currently use alcohol. She reports current drug use. Drug: Marijuana.  Allergies  Allergen Reactions   Latex Anaphylaxis, Hives and Itching    Other reaction(s): Other (See Comments) SKIN BURNING & PEELING    Nsaids Other (See Comments)    Serve ulcers; stomach aches, GERD.  Other Reaction(s): Other (See Comments)   Tomato Anaphylaxis, Hives and Itching   Gabapentin Palpitations    Has heart condition; also makes jittery   Metformin     Other Reaction(s): Abdominal Pain  Causes pancreatitis to worsen   Lisinopril     Other Reaction(s): Angioedema   Tramadol Itching and Hives   Duloxetine Anxiety    Makes her jittery    Family History  Problem Relation Age of Onset   Fibroids Mother    Hypertension Mother    Arthritis Mother     Prior to Admission medications   Medication Sig Start Date End Date Taking? Authorizing Provider  amitriptyline (ELAVIL) 10 MG tablet Take 10 mg by mouth 3 (three) times daily as needed. 11/02/22   [provider]  amLODipine (NORVASC) 10 MG tablet Take 1 tablet (10 mg total) by mouth daily. 07/19/23   Marcelino Duster, MD  APPLE CIDER VINEGAR PO Take 1 capsule by mouth 3 (three) times daily.    [provider]  aspirin 81  MG chewable tablet Chew 81 mg by mouth daily. 08/04/22 08/04/23  [provider]  atorvastatin (LIPITOR) 40 MG tablet Take 40 mg by mouth daily. 10/17/22   [provider]  Buprenorphine HCl-Naloxone HCl 8-2 MG FILM Place 1 Film under the tongue 4 (four) times daily as needed. 03/14/23   [provider]  enoxaparin (LOVENOX) 120 MG/0.8ML injection Inject 0.8 mLs (120 mg total) into the skin 2 (two) times daily for 4 days. 07/18/23 07/22/23   Marcelino Duster, MD  losartan (COZAAR) 50 MG tablet Take 1 tablet (50 mg total) by mouth daily. 07/19/23   Marcelino Duster, MD  metFORMIN (GLUCOPHAGE) 500 MG tablet Take 500 mg by mouth every morning. 10/17/22   [provider]  methadone (DOLOPHINE) 10 MG tablet Take 10 mg by mouth 4 (four) times daily. 03/14/23   [provider]  naloxone [facility]) nasal spray 4 mg/0.1 mL Place 1 spray into the nose once as needed (to reverse overdose). 02/24/23   [provider]  omega-3 acid ethyl esters (LOVAZA) 1 g capsule Take 1 g by mouth 2 (two) times daily.    [provider]  omeprazole (PRILOSEC) 40 MG capsule Take 40 mg by mouth daily. 02/23/23   [provider]  Oxycodone HCl 10 MG TABS Take 1 tablet (10 mg total) by mouth every 6 (six) hours as needed. Patient taking differently: Take 5-10 mg by mouth every 6 (six) hours as needed. 02/09/22   Willy Eddy, MD  potassium chloride SA (KLOR-CON M) 20 MEQ tablet Take 2 tablets (40 mEq total) by mouth daily. 07/18/23   Marcelino Duster, MD  spironolactone (ALDACTONE) 25 MG tablet Take 25 mg by mouth daily. 02/03/22   [provider]  traZODone (DESYREL) 50 MG tablet Take 100 mg by mouth at bedtime. 09/07/21   [provider]  warfarin (COUMADIN) 10 MG tablet Take 1 tablet (10 mg total) by mouth one time only at 4 PM for 5 days. 07/18/23 07/23/23  Marcelino Duster, MD  warfarin (COUMADIN) 7.5 MG tablet Take 3.25-7.5 mg by mouth daily. Take 3.25mg   M/W/F/S/Sun and T/Th 7.5mg  (1 tab)    [provider]    Physical Exam: Vitals:   08/03/23 1010 08/03/23 1011 08/03/23 1427 08/03/23 1540  BP:  127/78 120/80 (!) 135/101  Pulse:   88 75  Resp:   20 18  Temp:   98 F (36.7 C)   TempSrc:   Oral   SpO2:   100% 97%  Weight: 123.4 kg     Height: 5\' 7"  (1.702 m)       Data Reviewed:  Reviewed patient's abdominal CT scan obtained on 07/13/2023    Latest Ref Rng & Units  08/03/2023   10:18 AM 07/16/2023    4:07 AM 07/15/2023    5:51 AM  CBC  WBC 4.0 - 10.5 K/uL 10.0  5.6  5.4   Hemoglobin 12.0 - 15.0 g/dL 16.1  09.6  04.5   Hematocrit 36.0 - 46.0 % 45.0  35.9  35.6   Platelets 150 - 400 K/uL 307  232  204        Latest Ref Rng & Units 08/03/2023   10:18 AM 07/18/2023    4:00 AM 07/17/2023    6:38 AM  CMP  Glucose 70 - 99 mg/dL 409  811  914   BUN 6 - 20 mg/dL 13  10  7    Creatinine 0.44 - 1.00 mg/dL 7.82  9.56  2.13  Sodium 135 - 145 mmol/L 132  135  134   Potassium 3.5 - 5.1 mmol/L 3.2  3.9  3.2   Chloride 98 - 111 mmol/L 95  104  103   CO2 22 - 32 mmol/L 22  21  23    Calcium 8.9 - 10.3 mg/dL 9.3  9.0  9.0   Total Protein 6.5 - 8.1 g/dL 8.0     Total Bilirubin 0.0 - 1.2 mg/dL 1.1     Alkaline Phos 38 - 126 U/L 81     AST 15 - 41 U/L 33     ALT 0 - 44 U/L 75         Assessment and Plan: Acute on chronic pancreatitis secondary to alcohol use Patient with history of gallstone pancreatitis status post gallbladder removal Presenting with epigastric pain with elevated lipase levels Continue as needed pain medication We will keep n.p.o. at this time until abdominal pain is improved Continue symptomatic management with Zofran Continue IV fluid Plan of care discussed with ED physician  Hyponatremia likely secondary to volume depletion Continue IV fluid above  Hypokalemia Continue repletion and monitoring  Chronic asthma,  Continue as needed nebulization  Chronic pain disorder,  Continue current pain management  GERD,  Continue PPI therapy   History of gallstones status post cholecystectomy No acute intervention at this time   Advance Care Planning:   Code Status: Full Code full code  Consults: None  Family Communication: None at bedside  Severity of Illness: The appropriate patient status for this patient is INPATIENT. Inpatient status is judged to be reasonable and necessary in order to provide the required intensity of  service to ensure the patient's safety. The patient's presenting symptoms, physical exam findings, and initial radiographic and laboratory data in the context of their chronic comorbidities is felt to place them at high risk for further clinical deterioration. Furthermore, it is not anticipated that the patient will be medically stable for discharge from the hospital within 2 midnights of admission.   * I certify that at the point of admission it is my clinical judgment that the patient will require inpatient hospital care spanning beyond 2 midnights from the point of admission due to high intensity of service, high risk for further deterioration and high frequency of surveillance required.*  Author: Loyce Dys, MD 08/03/2023 4:05 PM  For on call review [email or web address].

## 2023-08-03 NOTE — ED Provider Triage Note (Signed)
Emergency Medicine Provider Triage Evaluation Note  Sarah Sosa , a 43 y.o. female  was evaluated in triage.  Pt complains of n/v/d, history of pancreatitis, unsure if it is stomach bug or if she has pancreatitis again.  Review of Systems  Positive:  Negative:   Physical Exam  Pulse 97   Temp 97.9 F (36.6 C) (Oral)   Resp (!) 22   LMP 03/25/2018 (Exact Date)   SpO2 100%  Gen:   Awake, no distress   Resp:  Normal effort  MSK:   Moves extremities without difficulty  Other:    Medical Decision Making  Medically screening exam initiated at 10:10 AM.  Appropriate orders placed.  Sarah Sosa was informed that the remainder of the evaluation will be completed by another provider, this initial triage assessment does not replace that evaluation, and the importance of remaining in the ED until their evaluation is complete.     Faythe Ghee, PA-C 08/03/23 1010

## 2023-08-03 NOTE — Progress Notes (Signed)
PHARMACIST - PHYSICIAN COMMUNICATION  CONCERNING:  Enoxaparin (Lovenox) for DVT Prophylaxis    RECOMMENDATION: Patient was prescribed enoxaprin 40mg  q24 hours for VTE prophylaxis.   Filed Weights   08/03/23 1010  Weight: 123.4 kg (272 lb 0.8 oz)    Body mass index is 42.61 kg/m.  Estimated Creatinine Clearance: 115.9 mL/min (by C-G formula based on SCr of 0.87 mg/dL).   Based on [facility] policy patient is candidate for enoxaparin 0.5mg /kg TBW SQ every 24 hours based on BMI being >30.  DESCRIPTION: Pharmacy has adjusted enoxaparin dose per [facility] policy.  Patient is now receiving enoxaparin 62.5 mg every 24 hours    Rockwell Alexandria, PharmD Clinical Pharmacist  08/03/2023 3:59 PM

## 2023-08-03 NOTE — ED Notes (Signed)
See triage note  Presents with abd pain    States pain is to the left side  Started about 5 am   afebrile on arrval

## 2023-08-03 NOTE — ED Provider Notes (Signed)
[facility] Provider Note    Event Date/Time   First MD Initiated Contact with Patient 08/03/23 1407     (approximate)   History   Abdominal Pain   HPI  Sarah Sosa is a 43 y.o. female with a history of pancreatitis presents with complaints of upper abdominal pain, radiating to her left upper quadrant which she reports is consistent with prior episodes of pancreatitis.  She reports she has been taking Percocet at home with little relief, not tolerating p.o.'s.  No fevers reported.     Physical Exam   Triage Vital Signs: ED Triage Vitals  Encounter Vitals Group     BP 08/03/23 1011 127/78     Systolic BP Percentile --      Diastolic BP Percentile --      Pulse Rate 08/03/23 1009 97     Resp 08/03/23 1009 (!) 22     Temp 08/03/23 1009 97.9 F (36.6 C)     Temp Source 08/03/23 1009 Oral     SpO2 08/03/23 1009 100 %     Weight 08/03/23 1010 123.4 kg (272 lb 0.8 oz)     Height 08/03/23 1010 1.702 m (5\' 7" )     Head Circumference --      Peak Flow --      Pain Score 08/03/23 1010 10     Pain Loc --      Pain Education --      Exclude from Growth Chart --     Most recent vital signs: Vitals:   08/03/23 1011 08/03/23 1427  BP: 127/78 120/80  Pulse:  88  Resp:  20  Temp:  98 F (36.7 C)  SpO2:  100%     General: Awake,  CV:  Good peripheral perfusion.  Resp:  Normal effort.  Abd:  No distention.  Tenderness in the epigastrium Other:     ED Results / Procedures / Treatments   Labs (all labs ordered are listed, but only abnormal results are displayed) Labs Reviewed  LIPASE, BLOOD - Abnormal; Notable for the following components:      Result Value   Lipase 346 (*)    All other components within normal limits  COMPREHENSIVE METABOLIC PANEL - Abnormal; Notable for the following components:   Sodium 132 (*)    Potassium 3.2 (*)    Chloride 95 (*)    Glucose, Bld 156 (*)    ALT 75 (*)    All other components within normal limits   RESP PANEL BY RT-PCR (RSV, FLU A&B, COVID)  RVPGX2  CBC  URINALYSIS, ROUTINE W REFLEX MICROSCOPIC     EKG     RADIOLOGY CT scans performed on December 27, do not feel repeat imaging is necessary    PROCEDURES:  Critical Care performed:   Procedures   MEDICATIONS ORDERED IN ED: Medications  morphine (PF) 4 MG/ML injection 4 mg (has no administration in time range)  ondansetron (ZOFRAN-ODT) disintegrating tablet 4 mg (4 mg Oral Given 08/03/23 1014)  morphine (PF) 4 MG/ML injection 4 mg (4 mg Intravenous Given 08/03/23 1457)  0.9 %  sodium chloride infusion ( Intravenous New Bag/Given 08/03/23 1457)     IMPRESSION / MDM / ASSESSMENT AND PLAN / ED COURSE  I reviewed the triage vital signs and the nursing notes. Patient's presentation is most consistent with severe exacerbation of chronic illness.  Patient presents with abdominal pain as detailed above, differential includes gastritis, viral illness, acute on chronic pancreatitis  Patient's lipase is elevated consistent with pancreatitis, will treat with IV morphine IV Zofran, otherwise lab work is reassuring  Patient is requiring a second dose of IV morphine  She will require admission to the hospitalist team        FINAL CLINICAL IMPRESSION(S) / ED DIAGNOSES   Final diagnoses:  Acute pancreatitis, unspecified complication status, unspecified pancreatitis type     Rx / DC Orders   ED Discharge Orders     None        Note:  This document was prepared using Dragon voice recognition software and may include unintentional dictation errors.   Jene Every, MD 08/03/23 1540

## 2023-08-03 NOTE — ED Triage Notes (Signed)
Pt here with abd pain that started early this morning. Pt states pain is left sided, hx of pancreatitis. Pt states she was also exposed to someone that had the stomach flu. Pt endorses diarrhea and body aches. Pt denies fever.

## 2023-08-04 DIAGNOSIS — K859 Acute pancreatitis without necrosis or infection, unspecified: Secondary | ICD-10-CM | POA: Diagnosis not present

## 2023-08-04 LAB — CBC WITH DIFFERENTIAL/PLATELET
Abs Immature Granulocytes: 0.03 10*3/uL (ref 0.00–0.07)
Basophils Absolute: 0 10*3/uL (ref 0.0–0.1)
Basophils Relative: 0 %
Eosinophils Absolute: 0.4 10*3/uL (ref 0.0–0.5)
Eosinophils Relative: 6 %
HCT: 38.5 % (ref 36.0–46.0)
Hemoglobin: 12.9 g/dL (ref 12.0–15.0)
Immature Granulocytes: 0 %
Lymphocytes Relative: 32 %
Lymphs Abs: 2.5 10*3/uL (ref 0.7–4.0)
MCH: 30 pg (ref 26.0–34.0)
MCHC: 33.5 g/dL (ref 30.0–36.0)
MCV: 89.5 fL (ref 80.0–100.0)
Monocytes Absolute: 0.5 10*3/uL (ref 0.1–1.0)
Monocytes Relative: 6 %
Neutro Abs: 4.3 10*3/uL (ref 1.7–7.7)
Neutrophils Relative %: 56 %
Platelets: 259 10*3/uL (ref 150–400)
RBC: 4.3 MIL/uL (ref 3.87–5.11)
RDW: 13.5 % (ref 11.5–15.5)
WBC: 7.8 10*3/uL (ref 4.0–10.5)
nRBC: 0 % (ref 0.0–0.2)

## 2023-08-04 LAB — BASIC METABOLIC PANEL
Anion gap: 6 (ref 5–15)
BUN: 6 mg/dL (ref 6–20)
CO2: 24 mmol/L (ref 22–32)
Calcium: 8.5 mg/dL — ABNORMAL LOW (ref 8.9–10.3)
Chloride: 104 mmol/L (ref 98–111)
Creatinine, Ser: 0.64 mg/dL (ref 0.44–1.00)
GFR, Estimated: >60 mL/min (ref >60)
Glucose, Bld: 139 mg/dL — ABNORMAL HIGH (ref 70–99)
Potassium: 3.8 mmol/L (ref 3.5–5.1)
Sodium: 134 mmol/L — ABNORMAL LOW (ref 135–145)

## 2023-08-04 LAB — MAGNESIUM: Magnesium: 1.7 mg/dL (ref 1.7–2.4)

## 2023-08-04 MED ORDER — HYDROMORPHONE HCL 1 MG/ML IJ SOLN
1.0000 mg | Freq: Once | INTRAMUSCULAR | Status: AC
Start: 2023-08-04 — End: 2023-08-04
  Administered 2023-08-04: 1 mg via INTRAVENOUS
  Filled 2023-08-04: qty 1

## 2023-08-04 MED ORDER — CYCLOBENZAPRINE HCL 10 MG PO TABS
5.0000 mg | ORAL_TABLET | Freq: Three times a day (TID) | ORAL | Status: DC
  Administered 2023-08-04: 5 mg via ORAL
  Filled 2023-08-04: qty 1

## 2023-08-04 MED ORDER — SODIUM CHLORIDE 0.9 % IV BOLUS
2000.0000 mL | Freq: Once | INTRAVENOUS | Status: AC
  Administered 2023-08-04: 1000 mL via INTRAVENOUS

## 2023-08-04 MED ORDER — CYCLOBENZAPRINE HCL 10 MG PO TABS
10.0000 mg | ORAL_TABLET | Freq: Three times a day (TID) | ORAL | Status: DC
Start: 2023-08-04 — End: 2023-08-14
  Administered 2023-08-04 – 2023-08-13 (×27): 10 mg via ORAL
  Filled 2023-08-04 (×27): qty 1

## 2023-08-04 MED ORDER — CYCLOBENZAPRINE HCL 10 MG PO TABS
5.0000 mg | ORAL_TABLET | Freq: Once | ORAL | Status: AC
  Administered 2023-08-04: 5 mg via ORAL
  Filled 2023-08-04: qty 1

## 2023-08-04 MED ORDER — TRAZODONE HCL 100 MG PO TABS
100.0000 mg | ORAL_TABLET | Freq: Every day | ORAL | Status: DC
  Administered 2023-08-04 – 2023-08-13 (×9): 100 mg via ORAL
  Filled 2023-08-04 (×9): qty 1

## 2023-08-04 NOTE — ED Notes (Signed)
RN entered room to give pt pain meds and found pt visibly upset and crying. Pt states they have been having muscle cramps in their legs and in their hands throughout the night.. Pt states that their hands and feet have gotten "stuck" multiple times. We tried stretching her legs which provided very temporary relief. This RN palpated muscle spasms in the pt's leg that were pulsing. Pt reports that it is constant. MD has been made aware.

## 2023-08-04 NOTE — Progress Notes (Signed)
Progress Note   Patient: Sarah Sosa ATF:[id number] DOB: 07/05/1981 DOA: 08/03/2023     1 DOS: the patient was seen and examined on 08/04/2023   Brief hospital course: Sarah Sosa is a 43 y.o. 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  According to patient she last used alcohol 2 days ago.  According to her the pain is so severe that she finds it difficult to tolerate any oral intake.  Denies nausea or vomiting, chest pain, cough or urinary complaint. ED course: Temperature 97.9 respiratory rate 22 pulse 97 blood pressure 08/12/1977 saturating 100% on room air.  Labs significant for lipase 346. IV fluids as well as pain medication initiated and hospitalist service called to admit patient for further management.  Assessment and Plan: Acute on chronic pancreatitis secondary to alcohol use Patient with history of gallstone pancreatitis status post gallbladder removal Presenting with epigastric pain with elevated lipase levels Continue as needed pain medication Diet advanced to clear liquid Continue symptomatic management with Zofran Continue Cyclobenzaprine ordered on account of spasms   Hyponatremia likely secondary to volume depletion Continue IV fluid   Hypokalemia Continue repletion and monitoring   Chronic asthma,  Continue as needed nebulization   Chronic pain disorder,  Continue current pain management   GERD,  Continue PPI therapy     History of gallstones status post cholecystectomy No acute intervention at this time    Advance Care Planning:   Code Status: Full Code full code   Consults: None   Family Communication: None at bedside   Subjective:  Patient seen and examined at bedside this morning Complains of cramps in the  legs Still complains of abdominal pain however slightly better with pain medication  Physical Exam: General: Not in acute distress Abdomen: Tender in the epigastric region CVS: S1-S2 present no murmur appreciated CNS: Alert and oriented x 3 able to move lower extremities Respiratory: Clear to auscultation bilaterally  Data reviewed:    Latest Ref Rng & Units 08/04/2023    7:26 AM 08/03/2023   10:18 AM 07/18/2023    4:00 AM  BMP  Glucose 70 - 99 mg/dL 270  623  762   BUN 6 - 20 mg/dL 6  13  10    Creatinine 0.44 - 1.00 mg/dL 8.31  5.17  6.16   Sodium 135 - 145 mmol/L 134  132  135   Potassium 3.5 - 5.1 mmol/L 3.8  3.2  3.9   Chloride 98 - 111 mmol/L 104  95  104   CO2 22 - 32 mmol/L 24  22  21    Calcium 8.9 - 10.3 mg/dL 8.5  9.3  9.0     Vitals:   08/04/23 0916 08/04/23 0917 08/04/23 0918 08/04/23 1255  BP: 117/77 117/77 117/77 (!) 131/105  Pulse: 82 83 89 83  Resp:   18   Temp: 97.8 F (36.6 C)   97.7 F (36.5 C)  TempSrc: Oral     SpO2: 90% 95% 95% 100%  Weight:      Height:          Latest Ref Rng & Units 08/04/2023    7:26 AM 08/03/2023   10:18 AM 07/16/2023    4:07 AM  CBC  WBC 4.0 - 10.5 K/uL 7.8  10.0  5.6   Hemoglobin 12.0 -  15.0 g/dL 09.8  11.9  14.7   Hematocrit 36.0 - 46.0 % 38.5  45.0  35.9   Platelets 150 - 400 K/uL 259  307  232      Author: Loyce Dys, MD 08/04/2023 3:24 PM  For on call review [email or web address].

## 2023-08-04 NOTE — TOC Initial Note (Signed)
Transition of Care [facility]) - Initial/Assessment Note    Patient Details  Name: Sarah Sosa MRN: [id number] Date of Birth: [birth year]  [facility]) CM/SW Contact:    Liliana Cline, LCSW Phone Number: 08/04/2023, 4:18 PM  Clinical Narrative:                 Patient is known to [facility] for recent admission. See below high readmission risk assessment note from 07/15/23.  "Patient lives at home with her father and children. Father to transport home at DC. Patient drives.  PCP is Dr. Sela Hua at [address], [place]). Pharmacy is [facility]. Patient has a rollator and blood pressure cuff at home. "   TOC will follow for needs during this admission.   Expected Discharge Plan: Home/Self Care Barriers to Discharge: Continued Medical Work up   Patient Goals and CMS Choice            Expected Discharge Plan and Services       Living arrangements for the past 2 months: Single Family Home                                      Prior Living Arrangements/Services Living arrangements for the past 2 months: Single Family Home Lives with:: Parents, Minor Children              Current home services: DME    Activities of Daily Living   ADL Screening (condition at time of admission) Independently performs ADLs?: Yes (appropriate for developmental age) Is the patient deaf or have difficulty hearing?: No Does the patient have difficulty seeing, even when wearing glasses/contacts?: No Does the patient have difficulty concentrating, remembering, or making decisions?: No  Permission Sought/Granted                  Emotional Assessment              Admission diagnosis:  Acute pancreatitis [K85.90] Acute pancreatitis, unspecified complication status, unspecified pancreatitis type [K85.90] Patient Active Problem List   Diagnosis Date Noted   Elevated troponin 07/13/2023   Subtherapeutic international normalized ratio (INR) 07/13/2023    Chronic pancreatitis (HCC) 07/13/2023   S/p aortic valve replacement with 'On-X" valve (INR target of 1.5 to 2) and ascending aortic graft ([facility], 03/2022) 07/13/2023   CAP (community acquired pneumonia) 07/13/2023   Opioid overdose, accidental or unintentional, initial encounter (HCC) 03/20/2023   Vomiting 03/20/2023   Obesity, Class III, BMI 40-49.9 (morbid obesity) (HCC) 03/20/2023   History of pancreatitis 03/20/2023   Acute respiratory failure with hypoxia (HCC) 03/20/2023   Chronic use of opiate drug for therapeutic purpose 06/02/2022   S/P ascending aortic replacement 04/12/2022   Hypomagnesemia 11/23/2021   Essential hypertension 11/20/2021   GERD without esophagitis 11/20/2021   (HFpEF) heart failure with preserved ejection fraction (HCC) 11/20/2021   Peripheral neuropathy 11/20/2021   Asthma, chronic 11/20/2021   Acute alcoholic pancreatitis 11/20/2021   Alcohol abuse 11/20/2021   Acute pancreatitis 11/20/2021   History of heavy alcohol consumption 05/22/2021   Recurrent pancreatitis 06/04/2020   Intractable nausea and vomiting    Marijuana use    Hypertensive urgency 04/03/2020   Hypokalemia 04/03/2020   Tobacco abuse 04/03/2020   History of substance abuse (HCC) 04/03/2020   Ileus (HCC) 04/21/2018   Postoperative ileus (HCC) 04/20/2018   Postoperative state 04/15/2018   Uterine leiomyoma 03/19/2018  Menorrhagia with regular cycle 03/19/2018   Chronic pain disorder 03/19/2018   Pelvic pain 03/19/2018   Primary hypertension 03/19/2018   PCP:  [facility]:   [facility] [place], [place] - [place], [place] - [address] [place] [address] Phone: [phone] Fax: [phone]  [facility] [address] [place], [place] - [address] [place] [address] Phone: [phone] Fax: [phone]     Social Drivers of Health (SDOH) Social History: SDOH Screenings   Food Insecurity: No Food Insecurity (08/04/2023)   Housing: Low Risk  (08/04/2023)  Recent Concern: Housing - High Risk (08/02/2023)   Received from [facility]  Transportation Needs: No Transportation Needs (08/04/2023)  Utilities: Not At Risk (08/04/2023)  Financial Resource Strain: High Risk (07/19/2023)   Received from [facility]  Social Connections: Moderately Isolated (06/05/2021)   Received from [facility], [facility]  Stress: Stress Concern Present (06/05/2021)   Received from [facility], [facility]  Tobacco Use: High Risk (08/03/2023)   SDOH Interventions:     Readmission Risk Interventions    07/15/2023    2:29 PM 03/21/2023    9:27 AM 11/25/2021    4:37 PM  Readmission Risk Prevention Plan  Transportation Screening Complete Complete Complete  PCP or Specialist Appt within 3-5 Days Complete    HRI or Home Care Consult Complete    Social Work Consult for Recovery Care Planning/Counseling Complete    Palliative Care Screening Not Applicable    Medication Review Oceanographer) Complete Complete Complete  PCP or Specialist appointment within 3-5 days of discharge  Complete Complete  HRI or Home Care Consult  Complete Complete  SW Recovery Care/Counseling Consult  Complete Complete  Palliative Care Screening  Not Applicable Not Applicable  Skilled Nursing Facility  Not Applicable Not Applicable

## 2023-08-04 NOTE — ED Notes (Signed)
 Victorino Dike RN aware of assigned bed

## 2023-08-04 NOTE — Plan of Care (Signed)

## 2023-08-05 ENCOUNTER — Inpatient Hospital Stay: Payer: Medicaid Other

## 2023-08-05 DIAGNOSIS — K859 Acute pancreatitis without necrosis or infection, unspecified: Secondary | ICD-10-CM | POA: Diagnosis not present

## 2023-08-05 LAB — URINALYSIS, ROUTINE W REFLEX MICROSCOPIC
Bacteria, UA: NONE SEEN
Bilirubin Urine: NEGATIVE
Glucose, UA: NEGATIVE mg/dL
Ketones, ur: NEGATIVE mg/dL
Leukocytes,Ua: NEGATIVE
Nitrite: NEGATIVE
Protein, ur: 30 mg/dL — AB
Specific Gravity, Urine: 1.004 — ABNORMAL LOW (ref 1.005–1.030)
pH: 7 (ref 5.0–8.0)

## 2023-08-05 LAB — CBC WITH DIFFERENTIAL/PLATELET
Abs Immature Granulocytes: 0.03 10*3/uL (ref 0.00–0.07)
Basophils Absolute: 0 10*3/uL (ref 0.0–0.1)
Basophils Relative: 0 %
Eosinophils Absolute: 0.4 10*3/uL (ref 0.0–0.5)
Eosinophils Relative: 5 %
HCT: 38.8 % (ref 36.0–46.0)
Hemoglobin: 13.1 g/dL (ref 12.0–15.0)
Immature Granulocytes: 0 %
Lymphocytes Relative: 29 %
Lymphs Abs: 2.4 10*3/uL (ref 0.7–4.0)
MCH: 29.8 pg (ref 26.0–34.0)
MCHC: 33.8 g/dL (ref 30.0–36.0)
MCV: 88.4 fL (ref 80.0–100.0)
Monocytes Absolute: 0.5 10*3/uL (ref 0.1–1.0)
Monocytes Relative: 6 %
Neutro Abs: 5.1 10*3/uL (ref 1.7–7.7)
Neutrophils Relative %: 60 %
Platelets: 256 10*3/uL (ref 150–400)
RBC: 4.39 MIL/uL (ref 3.87–5.11)
RDW: 13.2 % (ref 11.5–15.5)
WBC: 8.5 10*3/uL (ref 4.0–10.5)
nRBC: 0 % (ref 0.0–0.2)

## 2023-08-05 LAB — URINE DRUG SCREEN, QUALITATIVE (ARMC ONLY)
Amphetamines, Ur Screen: NOT DETECTED
Barbiturates, Ur Screen: NOT DETECTED
Benzodiazepine, Ur Scrn: NOT DETECTED
Cannabinoid 50 Ng, Ur ~~LOC~~: NOT DETECTED
Cocaine Metabolite,Ur ~~LOC~~: NOT DETECTED
MDMA (Ecstasy)Ur Screen: NOT DETECTED
Methadone Scn, Ur: POSITIVE — AB
Opiate, Ur Screen: POSITIVE — AB
Phencyclidine (PCP) Ur S: NOT DETECTED
Tricyclic, Ur Screen: POSITIVE — AB

## 2023-08-05 LAB — BASIC METABOLIC PANEL
Anion gap: 10 (ref 5–15)
BUN: <5 mg/dL — ABNORMAL LOW (ref 6–20)
CO2: 23 mmol/L (ref 22–32)
Calcium: 9.1 mg/dL (ref 8.9–10.3)
Chloride: 102 mmol/L (ref 98–111)
Creatinine, Ser: 0.67 mg/dL (ref 0.44–1.00)
GFR, Estimated: >60 mL/min (ref >60)
Glucose, Bld: 122 mg/dL — ABNORMAL HIGH (ref 70–99)
Potassium: 4 mmol/L (ref 3.5–5.1)
Sodium: 135 mmol/L (ref 135–145)

## 2023-08-05 MED ORDER — ATORVASTATIN CALCIUM 20 MG PO TABS
40.0000 mg | ORAL_TABLET | Freq: Every day | ORAL | Status: DC
Start: 2023-08-05 — End: 2023-08-14
  Administered 2023-08-05 – 2023-08-13 (×9): 40 mg via ORAL
  Filled 2023-08-05 (×9): qty 2

## 2023-08-05 MED ORDER — HYDRALAZINE HCL 20 MG/ML IJ SOLN
10.0000 mg | Freq: Four times a day (QID) | INTRAMUSCULAR | Status: DC | PRN
  Administered 2023-08-05: 10 mg via INTRAVENOUS
  Filled 2023-08-05 (×2): qty 1

## 2023-08-05 MED ORDER — AMLODIPINE BESYLATE 10 MG PO TABS
10.0000 mg | ORAL_TABLET | Freq: Every day | ORAL | Status: DC
Start: 2023-08-05 — End: 2023-08-14
  Administered 2023-08-05 – 2023-08-13 (×7): 10 mg via ORAL
  Filled 2023-08-05 (×7): qty 1

## 2023-08-05 MED ORDER — AMITRIPTYLINE HCL 10 MG PO TABS
10.0000 mg | ORAL_TABLET | Freq: Every day | ORAL | Status: DC
  Administered 2023-08-05 – 2023-08-13 (×8): 10 mg via ORAL
  Filled 2023-08-05 (×10): qty 1

## 2023-08-05 MED ORDER — SPIRONOLACTONE 25 MG PO TABS
25.0000 mg | ORAL_TABLET | Freq: Every day | ORAL | Status: DC
  Administered 2023-08-05 – 2023-08-13 (×7): 25 mg via ORAL
  Filled 2023-08-05 (×7): qty 1

## 2023-08-05 MED ORDER — PANTOPRAZOLE SODIUM 40 MG PO TBEC
40.0000 mg | DELAYED_RELEASE_TABLET | Freq: Every day | ORAL | Status: DC
  Administered 2023-08-05 – 2023-08-13 (×9): 40 mg via ORAL
  Filled 2023-08-05 (×9): qty 1

## 2023-08-05 MED ORDER — HYDROMORPHONE HCL 1 MG/ML IJ SOLN
2.0000 mg | INTRAMUSCULAR | Status: DC | PRN
  Administered 2023-08-05 – 2023-08-06 (×7): 2 mg via INTRAVENOUS
  Filled 2023-08-05 (×7): qty 2

## 2023-08-05 MED ORDER — ESCITALOPRAM OXALATE 10 MG PO TABS
10.0000 mg | ORAL_TABLET | Freq: Every day | ORAL | Status: DC
  Filled 2023-08-05 (×2): qty 1

## 2023-08-05 MED ORDER — IOHEXOL 300 MG/ML  SOLN
100.0000 mL | Freq: Once | INTRAMUSCULAR | Status: AC | PRN
  Administered 2023-08-05: 100 mL via INTRAVENOUS

## 2023-08-05 MED ORDER — METFORMIN HCL 500 MG PO TABS
500.0000 mg | ORAL_TABLET | Freq: Every day | ORAL | Status: DC
  Administered 2023-08-05 – 2023-08-13 (×9): 500 mg via ORAL
  Filled 2023-08-05 (×9): qty 1

## 2023-08-05 NOTE — Progress Notes (Signed)
MEWS Progress Note  Patient Details Name: Sarah Sosa MRN: [id number] DOB: Feb 16, 1981 Today's Date: 08/05/2023   MEWS Flowsheet Documentation:  Assess: MEWS Score Temp: 98.8 F (37.1 C) BP: (!) 181/107 (MD aware) MAP (mmHg): 129 Pulse Rate: 82 Resp: 20 Level of Consciousness: Alert SpO2: 99 % O2 Device: Room Air Assess: MEWS Score MEWS Temp: 0 MEWS Systolic: 0 MEWS Pulse: 0 MEWS RR: 0 MEWS LOC: 0 MEWS Score: 0 MEWS Score Color: Green Assess: SIRS CRITERIA SIRS Temperature : 0 SIRS Respirations : 0 SIRS Pulse: 0 SIRS WBC: 0 SIRS Score Sum : 0 Assess: if the MEWS score is Yellow or Red Were vital signs accurate and taken at a resting state?: Yes Does the patient meet 2 or more of the SIRS criteria?: No MEWS guidelines implemented : Yes, yellow Treat MEWS Interventions: Considered administering scheduled or prn medications/treatments as ordered Take Vital Signs Increase Vital Sign Frequency : Yellow: Q2hr x1, continue Q4hrs until patient remains green for 12hrs Escalate MEWS: Escalate: Yellow: Discuss with charge nurse and consider notifying provider and/or RRT        Mathis Fare 08/05/2023, 11:36 AM

## 2023-08-05 NOTE — Progress Notes (Signed)
1725 Rings left in pt prev room 155 on bedside table. NT [place] returned rings to pt who transferred to room 211.

## 2023-08-05 NOTE — Progress Notes (Signed)
PT Cancellation Note  Patient Details Name: Sarah Sosa MRN: [id number] DOB: 05-26-1981   Cancelled Treatment:    Reason Eval/Treat Not Completed: Patient not medically ready. RN asks to hold today due to high BP. Will assess when appropriate.     Grayson White 08/05/2023, 12:03 PM

## 2023-08-05 NOTE — Progress Notes (Signed)
Progress Note   Patient: Sarah Sosa ZOX:[id number] DOB: 22-Feb-1981 DOA: 08/03/2023     2 DOS: the patient was seen and examined on 08/05/2023   Brief hospital course: Djuana Cockram is a 43 y.o. 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  According to patient she last used alcohol 2 days ago.  According to her the pain is so severe that she finds it difficult to tolerate any oral intake.  Denies nausea or vomiting, chest pain, cough or urinary complaint. ED course: Temperature 97.9 respiratory rate 22 pulse 97 blood pressure 08/12/1977 saturating 100% on room air.  Labs significant for lipase 346. IV fluids as well as pain medication initiated and hospitalist service called to admit patient for further management.   Assessment and Plan: Acute on chronic pancreatitis secondary to alcohol use Patient with history of gallstone pancreatitis status post gallbladder removal Presenting with epigastric pain with elevated lipase levels Continue as needed pain medication Diet advanced to clear liquid Continue symptomatic management with Zofran CT scan of the abdomen showing findings of minimal acute pancreatitis   Hyponatremia likely secondary to volume depletion Continue IV fluid   Hypokalemia Continue repletion and monitoring   Chronic asthma,  Continue as needed nebulization   Chronic pain disorder,  Continue current pain management   GERD,  Continue PPI therapy     History of gallstones status post cholecystectomy No acute intervention at this time    Advance Care Planning:   Code Status: Full Code full code   Consults: None   Family Communication: None at bedside     Subjective:  Patient seen and examined at bedside this morning Admits to  abdominal pain as well as cramping pain in the legs Denies nausea vomiting chest pain cough  Physical Exam: General: Not in acute distress Abdomen: Tender in the epigastric region CVS: S1-S2 present no murmur appreciated CNS: Alert and oriented x 3 able to move lower extremities Respiratory: Clear to auscultation bilaterally   Data reviewed:    Latest Ref Rng & Units 08/05/2023    3:52 AM 08/04/2023    7:26 AM 08/03/2023   10:18 AM  BMP  Glucose 70 - 99 mg/dL 811  914  782   BUN 6 - 20 mg/dL 5  6  13    Creatinine 0.44 - 1.00 mg/dL 9.56  2.13  0.86   Sodium 135 - 145 mmol/L 135  134  132   Potassium 3.5 - 5.1 mmol/L 4.0  3.8  3.2   Chloride 98 - 111 mmol/L 102  104  95   CO2 22 - 32 mmol/L 23  24  22    Calcium 8.9 - 10.3 mg/dL 9.1  8.5  9.3     Vitals:   08/05/23 1346 08/05/23 1502 08/05/23 1542 08/05/23 1707  BP: (!) 164/123 (!) 193/136 (!) 160/107 (!) 152/102  Pulse:   89 100  Resp: 18  20 20   Temp: 97.7 F (36.5 C)  99 F (37.2 C) 98.7 F (37.1 C)  TempSrc:   Oral Oral  SpO2: 100%  94% 100%  Weight:      Height:          Latest Ref Rng & Units 08/05/2023    3:52 AM 08/04/2023    7:26 AM 08/03/2023   10:18 AM  CBC  WBC 4.0 - 10.5 K/uL 8.5  7.8  10.0   Hemoglobin 12.0 - 15.0 g/dL 19.1  47.8  29.5   Hematocrit 36.0 - 46.0 % 38.8  38.5  45.0   Platelets 150 - 400 K/uL 256  259  307      Author: Loyce Dys, MD 08/05/2023 5:22 PM  For on call review [email or web address].

## 2023-08-05 NOTE — Progress Notes (Signed)
OT Cancellation Note  Patient Details Name: Sarah Sosa MRN: [id number] DOB: [birth year]   Cancelled Treatment:    Reason Eval/Treat Not Completed: Patient not medically ready. OT order received and chart reviewed. Pt with high BP readings since yesterday, nurse recommended therapy to hold d/t patient's pain uncontrolled and high BP readings. Will attempt OT eval at later date as medically appropriate.  Constance Goltz 08/05/2023, 11:16 AM

## 2023-08-05 NOTE — Plan of Care (Signed)
  Problem: Education: Goal: Knowledge of General Education information will improve Description: Including pain rating scale, medication(s)/side effects and non-pharmacologic comfort measures Outcome: Progressing   Problem: Activity: Goal: Risk for activity intolerance will decrease Outcome: Not Progressing   Problem: Pain Managment: Goal: General experience of comfort will improve and/or be controlled Outcome: Not Progressing   Problem: Safety: Goal: Ability to remain free from injury will improve Outcome: Progressing

## 2023-08-05 NOTE — Progress Notes (Signed)
Approximately 1700-- Pt transferred to room 211. All pt belongings transferred with pt. Handoff provided to receiving RN with all questions answered at this time.

## 2023-08-05 NOTE — Plan of Care (Signed)
  Problem: Elimination: Goal: Will not experience complications related to urinary retention Outcome: Progressing   Problem: Nutrition: Goal: Adequate nutrition will be maintained Outcome: Progressing

## 2023-08-06 DIAGNOSIS — K859 Acute pancreatitis without necrosis or infection, unspecified: Secondary | ICD-10-CM | POA: Diagnosis not present

## 2023-08-06 LAB — CBC WITH DIFFERENTIAL/PLATELET
Abs Immature Granulocytes: 0.02 10*3/uL (ref 0.00–0.07)
Basophils Absolute: 0 10*3/uL (ref 0.0–0.1)
Basophils Relative: 0 %
Eosinophils Absolute: 0.4 10*3/uL (ref 0.0–0.5)
Eosinophils Relative: 5 %
HCT: 40.5 % (ref 36.0–46.0)
Hemoglobin: 13.8 g/dL (ref 12.0–15.0)
Immature Granulocytes: 0 %
Lymphocytes Relative: 33 %
Lymphs Abs: 2.4 10*3/uL (ref 0.7–4.0)
MCH: 30.6 pg (ref 26.0–34.0)
MCHC: 34.1 g/dL (ref 30.0–36.0)
MCV: 89.8 fL (ref 80.0–100.0)
Monocytes Absolute: 0.5 10*3/uL (ref 0.1–1.0)
Monocytes Relative: 7 %
Neutro Abs: 3.8 10*3/uL (ref 1.7–7.7)
Neutrophils Relative %: 55 %
Platelets: 251 10*3/uL (ref 150–400)
RBC: 4.51 MIL/uL (ref 3.87–5.11)
RDW: 13.3 % (ref 11.5–15.5)
WBC: 7.1 10*3/uL (ref 4.0–10.5)
nRBC: 0 % (ref 0.0–0.2)

## 2023-08-06 LAB — BASIC METABOLIC PANEL
Anion gap: 8 (ref 5–15)
BUN: 7 mg/dL (ref 6–20)
CO2: 25 mmol/L (ref 22–32)
Calcium: 9.1 mg/dL (ref 8.9–10.3)
Chloride: 102 mmol/L (ref 98–111)
Creatinine, Ser: 0.74 mg/dL (ref 0.44–1.00)
GFR, Estimated: >60 mL/min (ref >60)
Glucose, Bld: 134 mg/dL — ABNORMAL HIGH (ref 70–99)
Potassium: 3.6 mmol/L (ref 3.5–5.1)
Sodium: 135 mmol/L (ref 135–145)

## 2023-08-06 LAB — PROTIME-INR
INR: 1.1 (ref 0.8–1.2)
Prothrombin Time: 14.8 s (ref 11.4–15.2)

## 2023-08-06 MED ORDER — WARFARIN - PHARMACIST DOSING INPATIENT
Freq: Every day | Status: DC
Start: 2023-08-06 — End: 2023-08-14

## 2023-08-06 MED ORDER — HYDROMORPHONE HCL 1 MG/ML IJ SOLN
1.0000 mg | INTRAMUSCULAR | Status: DC | PRN
  Administered 2023-08-06 – 2023-08-08 (×14): 1 mg via INTRAVENOUS
  Filled 2023-08-06 (×15): qty 1

## 2023-08-06 MED ORDER — WARFARIN SODIUM 3 MG PO TABS: 3.2500 mg | ORAL_TABLET | Freq: Every day | ORAL | Status: DC

## 2023-08-06 MED ORDER — WARFARIN SODIUM 6 MG PO TABS
6.0000 mg | ORAL_TABLET | Freq: Once | ORAL | Status: AC
Start: 2023-08-06 — End: 2023-08-06
  Administered 2023-08-06: 6 mg via ORAL
  Filled 2023-08-06: qty 1

## 2023-08-06 NOTE — Progress Notes (Signed)
PHARMACY - ANTICOAGULATION CONSULT NOTE  Pharmacy Consult for Warfarin Indication:  s/p mechanical valve in 2023   Allergies  Allergen Reactions   Latex Anaphylaxis, Hives and Itching    Other reaction(s): Other (See Comments) SKIN BURNING & PEELING    Nsaids Other (See Comments)    Serve ulcers; stomach aches, GERD.  Other Reaction(s): Other (See Comments)   Tomato Anaphylaxis, Hives and Itching   Gabapentin Palpitations    Has heart condition; also makes jittery   Metformin     Other Reaction(s): Abdominal Pain  Causes pancreatitis to worsen   Lisinopril     Other Reaction(s): Angioedema   Tramadol Itching and Hives   Duloxetine Anxiety    Makes her jittery    Patient Measurements: Height: 5\' 7"  (170.2 cm) Weight: 123.4 kg (272 lb 0.8 oz) IBW/kg (Calculated) : 61.6 Heparin Dosing Weight: N/A  Vital Signs: Temp: 98.3 F (36.8 C) (01/20 0915) Temp Source: Oral (01/20 0915) BP: 130/84 (01/20 0915) Pulse Rate: 90 (01/20 0915)  Labs: Recent Labs    08/04/23 0726 08/05/23 0352 08/06/23 0508  HGB 12.9 13.1 13.8  HCT 38.5 38.8 40.5  PLT 259 256 251  CREATININE 0.64 0.67 0.74    Estimated Creatinine Clearance: 126.1 mL/min (by C-G formula based on SCr of 0.74 mg/dL).   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 Pharmacy was consulted  to re-start patient on her home warfarin.  No signs/symptoms of bleeding noted in chart. Hgb 13.8. PLT 251.   Home warfarin dose: 7.5 mg Tues/Thurs and 3.75 mg all other days (TWD 33.75 mg)  INR goal: 1.5-2 Last dose: 08/03/23 Baseline INR:  Date INR Warfarin Dose  1/20 1.1 6 mg (ordered)       Goal of Therapy:  INR 1.5-2 Monitor platelets by anticoagulation protocol: Yes   Plan:  INR subtherapeutic, likely due to being held since 1/17 Will give warfarin 6 mg PO x 1 (~1.5x home dose) Monitor INR and CBC daily   Merryl Hacker, PharmD Clinical Pharmacist  08/06/2023,10:12 AM

## 2023-08-06 NOTE — Progress Notes (Signed)
Progress Note   Patient: Sarah Sosa RKY:[id number] DOB: 1981-03-04 DOA: 08/03/2023     3 DOS: the patient was seen and examined on 08/06/2023     Brief hospital course: Sarah Sosa is a 43 y.o. 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  According to patient she last used alcohol 2 days ago.  According to her the pain is so severe that she finds it difficult to tolerate any oral intake.  Denies nausea or vomiting, chest pain, cough or urinary complaint. ED course: Temperature 97.9 respiratory rate 22 pulse 97 blood pressure 08/12/1977 saturating 100% on room air.  Labs significant for lipase 346. IV fluids as well as pain medication initiated and hospitalist service called to admit patient for further management.   Assessment and Plan: Acute on chronic pancreatitis secondary to alcohol use Patient with history of gallstone pancreatitis status post gallbladder removal Presenting with epigastric pain with elevated lipase levels I have decreased her Dilaudid to 1 mg We will advance diet to regular today Continue symptomatic management with Zofran CT scan of the abdomen showing findings of minimal acute pancreatitis   Hyponatremia likely secondary to volume depletion Patient received a course of IV fluid   History of mechanical valve placed in 2023 Continue warfarin Pharmacy consulted for dosing Monitor INR closely  Hypokalemia Continue repletion and monitoring   Chronic asthma,  Continue as needed nebulization   Chronic pain disorder,  Continue current pain management   GERD,  Continue PPI therapy     History of gallstones status post cholecystectomy No acute intervention at this time    Advance Care Planning:   Code Status: Full Code full  code   Consults: None   Family Communication: None at bedside     Subjective:  Patient seen and examined at bedside this morning Admits to improvement in abdominal pain Denies nausea or vomiting Cramping in the lower extremity improving We will advance diet today   Physical Exam: General: Not in acute distress Abdomen: Tender in the epigastric region CVS: S1-S2 present no murmur appreciated CNS: Alert and oriented x 3 able to move lower extremities Respiratory: Clear to auscultation bilaterally   Data reviewed:    Latest Ref Rng & Units 08/06/2023    5:08 AM 08/05/2023    3:52 AM 08/04/2023    7:26 AM  BMP  Glucose 70 - 99 mg/dL 315  176  160   BUN 6 - 20 mg/dL 7  <5  6   Creatinine 7.37 - 1.00 mg/dL 1.06  2.69  4.85   Sodium 135 - 145 mmol/L 135  135  134   Potassium 3.5 - 5.1 mmol/L 3.6  4.0  3.8   Chloride 98 - 111 mmol/L 102  102  104   CO2 22 - 32 mmol/L 25  23  24    Calcium 8.9 - 10.3 mg/dL 9.1  9.1  8.5     Vitals:   08/05/23 2106 08/06/23 0424 08/06/23 0915 08/06/23 1601  BP: (!) 152/94 137/85 130/84 (!) 136/97  Pulse: (!) 109 87 90 90  Resp: 18 16 16 16   Temp: 99.3 F (37.4 C) 99 F (37.2 C) 98.3 F (36.8 C) 98.3 F (36.8 C)  TempSrc: Oral Oral Oral Oral  SpO2: 99% 94% 94% 94%  Weight:  Height:          Latest Ref Rng & Units 08/06/2023    5:08 AM 08/05/2023    3:52 AM 08/04/2023    7:26 AM  CBC  WBC 4.0 - 10.5 K/uL 7.1  8.5  7.8   Hemoglobin 12.0 - 15.0 g/dL 96.0  45.4  09.8   Hematocrit 36.0 - 46.0 % 40.5  38.8  38.5   Platelets 150 - 400 K/uL 251  256  259      Author: Loyce Dys, MD 08/06/2023 5:43 PM  For on call review [email or web address].

## 2023-08-06 NOTE — Progress Notes (Signed)
PT Cancellation Note  Patient Details Name: Sarah Sosa MRN: [id number] DOB: March 29, 1981   Cancelled Treatment:    Reason Eval/Treat Not Completed: Patient declined, no reason specified (Consult received and chart reviewed.  Patient declining treatment session at this time, requesting therapist re-attempt in PM.  Will continue efforts as appropriate.)   Yunuen Mordan H. Manson Passey, PT, DPT, NCS 08/06/23, 11:03 AM [phone]

## 2023-08-06 NOTE — Evaluation (Signed)
Physical Therapy Evaluation Patient Details Name: Sarah Sosa MRN: [id number] DOB: [birth year] Today's Date: 08/06/2023  History of Present Illness  presented to ER secondary to abdominal pain; admitted for management of acute/chronic pancreatitis  Clinical Impression  Patient resting in bed upon arrival to room; alert and oriented, follows commands and agreeable to participation with session.  Endorses generalized pain to bilat LEs, FACES 4-6/10, but reports improvement in 'spasms' this date.  Bilat UE/LE strength and ROM grossly symmetrical and WFL for basic transfers and gait; no focal weakness appreciated.  Able to complete bed mobility with mod indep; sit/stand, static standing and bed/chair transfer with RW, cga/min assist.  Insistent on elevated bed surface for initial transfer "because of my knees"; very heavy WBing bilat UEs on RW with standing to balance/stabilize and offload LEs.  Demonstrates very slow, effortful stepping pattern; difficulty with full bilat knee extension, but no overt buckling or LOB.  Additional gait deferred, as OT present for ADL/evaluation.  Will continue to assess/progress in subsequent sessions. Would benefit from skilled PT to address above deficits and promote optimal return to PLOF.; recommend post-acute PT follow up as indicated by interdisciplinary care team.            If plan is discharge home, recommend the following: A little help with walking and/or transfers;A little help with bathing/dressing/bathroom   Can travel by private vehicle        Equipment Recommendations Rolling walker (2 wheels)  Recommendations for Other Services       Functional Status Assessment Patient has had a recent decline in their functional status and demonstrates the ability to make significant improvements in function in a reasonable and predictable amount of time.     Precautions / Restrictions Precautions Precautions: Fall Restrictions Weight Bearing Restrictions  Per Provider Order: No      Mobility  Bed Mobility Overal bed mobility: Needs Assistance Bed Mobility: Supine to Sit     Supine to sit: Modified independent (Device/Increase time)          Transfers Overall transfer level: Needs assistance Equipment used: Rolling walker (2 wheels) Transfers: Sit to/from Stand, Bed to chair/wheelchair/BSC Sit to Stand: Contact guard assist, Supervision Stand pivot transfers: Contact guard assist, Supervision, From elevated surface (patient insistent on elevated bed surface "to make it easier on my knees")         General transfer comment: heavy use of UEs to assist with lift off and standing balance/stabilization; heavy WBing bilat UEs to offset LE pain    Ambulation/Gait               General Gait Details: deferred this session; patient with OT for ADL once up to chair  Stairs            Wheelchair Mobility     Tilt Bed    Modified Rankin (Stroke Patients Only)       Balance Overall balance assessment: Needs assistance Sitting-balance support: No upper extremity supported, Feet supported Sitting balance-Leahy Scale: Good     Standing balance support: Bilateral upper extremity supported Standing balance-Leahy Scale: Fair                               Pertinent Vitals/Pain Pain Assessment Pain Assessment: Faces Faces Pain Scale: Hurts even more Pain Location: bilat LEs Pain Descriptors / Indicators: Aching, Guarding, Grimacing Pain Intervention(s): Limited activity within patient's tolerance, Repositioned, Monitored during session    Home  Living Family/patient expects to be discharged to:: Private residence Living Arrangements:  (Lives with father and two, teenage daughters (70 y/o)) Available Help at Discharge: Family Type of Home: House       Alternate Level Stairs-Number of Steps: Bedroom on main level, bathroom on upper level; full flight, no steps required for access Home Layout: Two  level;Bed/bath upstairs Home Equipment: Rollator (4 wheels)      Prior Function Prior Level of Function : Independent/Modified Independent             Mobility Comments: Indep without assist device for ADLs, household and community mobilization without assist device; denies fall history.       Extremity/Trunk Assessment   Upper Extremity Assessment Upper Extremity Assessment: Overall WFL for tasks assessed    Lower Extremity Assessment Lower Extremity Assessment: Generalized weakness (grossly at least 4-/5 throughout; no focal weakness appreciated)       Communication   Communication Communication: No apparent difficulties  Cognition Arousal: Alert Behavior During Therapy: WFL for tasks assessed/performed Overall Cognitive Status: Within Functional Limits for tasks assessed                                          General Comments      Exercises     Assessment/Plan    PT Assessment Patient needs continued PT services  PT Problem List Decreased strength;Decreased mobility;Decreased activity tolerance;Decreased balance;Decreased coordination;Decreased knowledge of use of DME;Decreased safety awareness;Decreased knowledge of precautions;Pain       PT Treatment Interventions DME instruction;Gait training;Stair training;Functional mobility training;Therapeutic activities;Therapeutic exercise;Balance training;Patient/family education    PT Goals (Current goals can be found in the Care Plan section)  Acute Rehab PT Goals Patient Stated Goal: to return home PT Goal Formulation: With patient Time For Goal Achievement: 08/20/23 Potential to Achieve Goals: Good    Frequency Min 1X/week     Co-evaluation               AM-PAC PT "6 Clicks" Mobility  Outcome Measure Help needed turning from your back to your side while in a flat bed without using bedrails?: None Help needed moving from lying on your back to sitting on the side of a flat bed  without using bedrails?: None Help needed moving to and from a bed to a chair (including a wheelchair)?: A Little Help needed standing up from a chair using your arms (e.g., wheelchair or bedside chair)?: A Little Help needed to walk in hospital room?: A Little Help needed climbing 3-5 steps with a railing? : A Little 6 Click Score: 20    End of Session   Activity Tolerance: Patient tolerated treatment well Patient left: in chair;with call bell/phone within reach (wiht OT at bedside) Nurse Communication: Mobility status PT Visit Diagnosis: Muscle weakness (generalized) (M62.81);Difficulty in walking, not elsewhere classified (R26.2)    Time: 1610-9604 PT Time Calculation (min) (ACUTE ONLY): 15 min   Charges:   PT Evaluation $PT Eval Moderate Complexity: 1 Mod   PT General Charges $$ ACUTE PT VISIT: 1 Visit        Breccan Galant H. Manson Passey, PT, DPT, NCS 08/06/23, 5:37 PM [phone]

## 2023-08-06 NOTE — Plan of Care (Signed)

## 2023-08-06 NOTE — Evaluation (Addendum)
Occupational Therapy Evaluation Patient Details Name: Sarah Sosa MRN: [id number] DOB: 03/07/1981 Today's Date: 08/06/2023   History of Present Illness Pt is a 43 y.o. female presenting to [facility] ED with abdominal pain and admitted for management of acute/chronic pancreatitis. PMH significant for GERD, chronic pain disorder, gallstones s/p cholecystectomy   Clinical Impression   Pt admitted with above diagnosis. PTA, pt was independent with ADLs and mobility. Reporting 7/10 pain start of session. Transfers EOB<>recliner with RW + CGA, UB/LB bathing + dressing completed from sit<>stand at recliner with setup and intermittent CGA from therapist for safety. Pt with sudden onset of "hot flash", climbing back to bed on all fours and electing to return seated upright in bed vs in recliner. Pt would benefit from skilled OT services to address noted impairments and functional limitations (see below for any additional details) in order to maximize safety and independence while minimizing falls risk and caregiver burden. Anticipate the need for follow up [facility] OT services upon acute hospital DC.       If plan is discharge home, recommend the following: A little help with walking and/or transfers;A little help with bathing/dressing/bathroom;Assistance with cooking/housework;Assist for transportation    Functional Status Assessment  Patient has had a recent decline in their functional status and demonstrates the ability to make significant improvements in function in a reasonable and predictable amount of time.  Equipment Recommendations  BSC/3in1       Precautions / Restrictions Precautions Precautions: Fall Restrictions Weight Bearing Restrictions Per Provider Order: No      Mobility Bed Mobility Overal bed mobility: Needs Assistance Bed Mobility: Supine to Sit     Supine to sit: Modified independent (Device/Increase time)          Transfers Overall transfer level: Needs  assistance Equipment used: Rolling walker (2 wheels) Transfers: Sit to/from Stand, Bed to chair/wheelchair/BSC Sit to Stand: Contact guard assist, Supervision Stand pivot transfers: Contact guard assist, Supervision, From elevated surface         General transfer comment: pt insisted on bed raised due to knee pain, increased time and heavy use of bilat UE on RW      Balance Overall balance assessment: Needs assistance Sitting-balance support: No upper extremity supported, Feet supported Sitting balance-Leahy Scale: Good Sitting balance - Comments: ADL performance seated in recliner, no LOB, pt guarding BLE but no unsteadiness noted   Standing balance support: Bilateral upper extremity supported Standing balance-Leahy Scale: Fair                             ADL either performed or assessed with clinical judgement   ADL Overall ADL's : Needs assistance/impaired Eating/Feeding: Set up;Sitting   Grooming: Set up;Sitting   Upper Body Bathing: Sitting;Set up   Lower Body Bathing: Sit to/from stand;Set up;Contact guard assist;Supervison/ safety Lower Body Bathing Details (indicate cue type and reason): from recliner, sit<>stand with CGA - supervision for safety Upper Body Dressing : Sitting;Set up Upper Body Dressing Details (indicate cue type and reason): dons dress overhead seated in recliner Lower Body Dressing: Sit to/from stand;Minimal assistance Lower Body Dressing Details (indicate cue type and reason): doffs/donns underwear sit<>stand with close sup for safety, minA to pull underwear overhips Toilet Transfer: Contact guard assist;BSC/3in1;Rolling walker (2 wheels)   Toileting- Clothing Manipulation and Hygiene: Contact guard assist       Functional mobility during ADLs: Contact guard assist;Rolling walker (2 wheels) General ADL Comments: Pt transfers to recliner  with +2 for safety, ADL bathing from sit<>stands with close supervision/CGA. MinA to pull underwear  over hips. Once dressed, pt opts to climb back into bed due to "hot flash" and declines staying upright in recliner. OT provides fan for pt comfort.     Vision Baseline Vision/History: 0 No visual deficits              Pertinent Vitals/Pain Pain Assessment Pain Assessment: Faces Faces Pain Scale: Hurts even more Pain Location: bilat LEs Pain Descriptors / Indicators: Aching, Guarding, Grimacing Pain Intervention(s): Limited activity within patient's tolerance, Monitored during session     Extremity/Trunk Assessment Upper Extremity Assessment Upper Extremity Assessment: Overall WFL for tasks assessed;Right hand dominant (grossly 4-/5 throughout)   Lower Extremity Assessment Lower Extremity Assessment: Defer to PT evaluation   Cervical / Trunk Assessment Cervical / Trunk Assessment: Normal   Communication Communication Communication: No apparent difficulties   Cognition Arousal: Alert Behavior During Therapy: WFL for tasks assessed/performed Overall Cognitive Status: Within Functional Limits for tasks assessed                                                  Home Living Family/patient expects to be discharged to:: Private residence Living Arrangements: Spouse/significant other Available Help at Discharge: Family Type of Home: House       Home Layout: Two level;Bed/bath upstairs Alternate Level Stairs-Number of Steps: Bedroom on main level, bathroom on upper level; full flight, no steps required for access   Bathroom Shower/Tub: Chief Strategy Officer: Standard     Home Equipment: Rollator (4 wheels)          Prior Functioning/Environment Prior Level of Function : Independent/Modified Independent             Mobility Comments: Indep without assist device for ADLs, household and community mobilization without assist device; denies fall history.          OT Problem List: Decreased strength;Decreased activity  tolerance;Impaired balance (sitting and/or standing);Pain;Obesity      OT Treatment/Interventions: Self-care/ADL training;Energy conservation;DME and/or AE instruction;Therapeutic activities;Patient/family education;Balance training    OT Goals(Current goals can be found in the care plan section) Acute Rehab OT Goals OT Goal Formulation: With patient Time For Goal Achievement: 08/20/23 Potential to Achieve Goals: Good  OT Frequency: Min 1X/week       AM-PAC OT "6 Clicks" Daily Activity     Outcome Measure Help from another person eating meals?: None Help from another person taking care of personal grooming?: None Help from another person toileting, which includes using toliet, bedpan, or urinal?: A Little Help from another person bathing (including washing, rinsing, drying)?: A Little Help from another person to put on and taking off regular upper body clothing?: A Little Help from another person to put on and taking off regular lower body clothing?: A Little 6 Click Score: 20   End of Session Equipment Utilized During Treatment: Rolling walker (2 wheels) Nurse Communication: Mobility status  Activity Tolerance: Patient tolerated treatment well Patient left: in bed;with call bell/phone within reach;with bed alarm set  OT Visit Diagnosis: Unsteadiness on feet (R26.81);Other abnormalities of gait and mobility (R26.89);Pain Pain - Right/Left:  (both) Pain - part of body: Leg                Time: 2130-8657 OT Time Calculation (min): 26 min  Charges:  OT General Charges $OT Visit: 1 Visit OT Evaluation $OT Eval Low Complexity: 1 Low OT Treatments $Self Care/Home Management : 8-22 mins  Jorryn Casagrande L. Esias Mory, OTR/L  08/06/23, 5:22 PM

## 2023-08-06 NOTE — Progress Notes (Signed)
OT Cancellation Note  Patient Details Name: Sarah Sosa MRN: [id number] DOB: 19-Sep-1980   Cancelled Treatment:    Reason Eval/Treat Not Completed: Pain limiting ability to participate. Orders received, chart reviewed.   OT attempted x2 to perform OT eval this AM. Pt stating 9/10 pain first attempt, RN notified and meds given. 8/10 pain second attempt after medication. Pt endorsing bilateral LE spasms/pain at rest that worsen with mobility and requests that OT eval be performed at a later time. Pt has been using BSC with +1 assist. OT will re-attempt as able.   Tenlee Wollin L. Madisun Hargrove, OTR/L  08/06/23, 10:54 AM

## 2023-08-07 DIAGNOSIS — K859 Acute pancreatitis without necrosis or infection, unspecified: Secondary | ICD-10-CM | POA: Diagnosis not present

## 2023-08-07 LAB — PROTIME-INR
INR: 1.1 (ref 0.8–1.2)
Prothrombin Time: 14.4 s (ref 11.4–15.2)

## 2023-08-07 MED ORDER — SODIUM CHLORIDE 0.9 % IV SOLN: INTRAVENOUS | Status: AC

## 2023-08-07 MED ORDER — WARFARIN SODIUM 7.5 MG PO TABS
7.5000 mg | ORAL_TABLET | Freq: Once | ORAL | Status: AC
  Administered 2023-08-07: 7.5 mg via ORAL
  Filled 2023-08-07: qty 1

## 2023-08-07 NOTE — Progress Notes (Signed)
Progress Note   Patient: Sarah Sosa WUJ:[id number] DOB: 1981/02/03 DOA: 08/03/2023     4 DOS: the patient was seen and examined on 08/07/2023       Brief hospital course: Sarah Sosa is a 43 y.o. 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  According to patient she last used alcohol 2 days ago.  According to her the pain is so severe that she finds it difficult to tolerate any oral intake.  Denies nausea or vomiting, chest pain, cough or urinary complaint. ED course: Temperature 97.9 respiratory rate 22 pulse 97 blood pressure 08/12/1977 saturating 100% on room air.  Labs significant for lipase 346. IV fluids as well as pain medication initiated and hospitalist service called to admit patient for further management.   Assessment and Plan: Acute on chronic pancreatitis secondary to alcohol use Patient with history of gallstone pancreatitis status post gallbladder removal Presenting with epigastric pain with elevated lipase levels Continue as needed pain medication Continue IV fluid Diet changed to clear liquid diet today on account of worsening abdominal pain Continue symptomatic management with Zofran CT scan of the abdomen showing findings of minimal acute pancreatitis   Hyponatremia likely secondary to volume depletion Continue IV fluid   History of mechanical valve placed in 2023 Continue warfarin Pharmacy consulted for dosing Monitor INR closely   Hypokalemia Continue repletion and monitoring   Chronic asthma,  Continue as needed nebulization   Chronic pain disorder,  Continue current pain management   GERD,  Continue PPI therapy     History of gallstones status post cholecystectomy No acute intervention at this time    Advance Care  Planning:   Code Status: Full Code full code   Consults: None   Family Communication: None at bedside     Subjective:  Patient seen and examined at bedside this morning Abdominal pain slightly worse today According to patient she was not able to tolerate regular diet today as she requested She has now been placed on clear liquid diet IV fluid restarted    Physical Exam: General: Not in acute distress Abdomen: Tender in the epigastric region CVS: S1-S2 present no murmur appreciated CNS: Alert and oriented x 3 able to move lower extremities Respiratory: Clear to auscultation bilaterally   Data reviewed:    Latest Ref Rng & Units 08/06/2023    5:08 AM 08/05/2023    3:52 AM 08/04/2023    7:26 AM  BMP  Glucose 70 - 99 mg/dL 956  213  086   BUN 6 - 20 mg/dL 7  <5  6   Creatinine 5.78 - 1.00 mg/dL 4.69  6.29  5.28   Sodium 135 - 145 mmol/L 135  135  134   Potassium 3.5 - 5.1 mmol/L 3.6  4.0  3.8   Chloride 98 - 111 mmol/L 102  102  104   CO2 22 - 32 mmol/L 25  23  24    Calcium 8.9 - 10.3 mg/dL 9.1  9.1  8.5     Vitals:   08/06/23 2006 08/07/23 0357 08/07/23 0859 08/07/23 1509  BP: 124/79 127/87 107/80 111/75  Pulse: 96 97 94 91  Resp: 18 18 18 16   Temp: 98.3 F (36.8 C) 98.5 F (36.9 C) 97.8 F (36.6 C) 98.6 F (37 C)  TempSrc: Oral Oral Oral  Oral  SpO2: 96% 92% 98% 94%  Weight:      Height:          Latest Ref Rng & Units 08/06/2023    5:08 AM 08/05/2023    3:52 AM 08/04/2023    7:26 AM  CBC  WBC 4.0 - 10.5 K/uL 7.1  8.5  7.8   Hemoglobin 12.0 - 15.0 g/dL 14.7  82.9  56.2   Hematocrit 36.0 - 46.0 % 40.5  38.8  38.5   Platelets 150 - 400 K/uL 251  256  259      Author: Loyce Dys, MD 08/07/2023 6:26 PM  For on call review [email or web address].

## 2023-08-07 NOTE — Plan of Care (Signed)

## 2023-08-07 NOTE — Plan of Care (Signed)

## 2023-08-07 NOTE — Progress Notes (Signed)
PHARMACY - ANTICOAGULATION CONSULT NOTE  Pharmacy Consult for Warfarin Indication:  s/p mechanical valve in 2023   Allergies  Allergen Reactions   Latex Anaphylaxis, Hives and Itching    Other reaction(s): Other (See Comments) SKIN BURNING & PEELING    Nsaids Other (See Comments)    Serve ulcers; stomach aches, GERD.  Other Reaction(s): Other (See Comments)   Tomato Anaphylaxis, Hives and Itching   Gabapentin Palpitations    Has heart condition; also makes jittery   Metformin     Other Reaction(s): Abdominal Pain  Causes pancreatitis to worsen   Lisinopril     Other Reaction(s): Angioedema   Tramadol Itching and Hives   Duloxetine Anxiety    Makes her jittery    Patient Measurements: Height: 5\' 7"  (170.2 cm) Weight: 123.4 kg (272 lb 0.8 oz) IBW/kg (Calculated) : 61.6 Heparin Dosing Weight: N/A  Vital Signs: Temp: 98.5 F (36.9 C) (01/21 0357) Temp Source: Oral (01/21 0357) BP: 127/87 (01/21 0357) Pulse Rate: 97 (01/21 0357)  Labs: Recent Labs    08/05/23 0352 08/06/23 0508 08/06/23 1122 08/07/23 0519  HGB 13.1 13.8  --   --   HCT 38.8 40.5  --   --   PLT 256 251  --   --   LABPROT  --   --  14.8 14.4  INR  --   --  1.1 1.1  CREATININE 0.67 0.74  --   --     Estimated Creatinine Clearance: 126.1 mL/min (by C-G formula based on SCr of 0.74 mg/dL).   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 Pharmacy was consulted to re-start patient on her home warfarin.  No signs/symptoms of bleeding noted in chart. Hgb 13.8. PLT 251.   Home warfarin dose: 7.5 mg Tues/Thurs and 3.75 mg all other days (TWD 33.75 mg)  INR goal: 1.5-2 Last dose: 08/03/23 Baseline INR:  Date INR Warfarin Dose  1/20 1.1 6 mg   1/21 1.1 7.5 mg (ordered)   Goal of Therapy:  INR 1.5-2 Monitor platelets by anticoagulation protocol: Yes   Plan:  INR subtherapeutic, likely due to being held since 1/17 Will give warfarin 7.5 mg PO x 1 (home dose, want to avoid overshooting her INR since she has been stable on this dose) Monitor INR and CBC daily   Merryl Hacker, PharmD Clinical Pharmacist  08/07/2023,7:27 AM

## 2023-08-08 DIAGNOSIS — K852 Alcohol induced acute pancreatitis without necrosis or infection: Secondary | ICD-10-CM

## 2023-08-08 LAB — BASIC METABOLIC PANEL WITH GFR
Anion gap: 7 (ref 5–15)
BUN: 9 mg/dL (ref 6–20)
CO2: 24 mmol/L (ref 22–32)
Calcium: 8.6 mg/dL — ABNORMAL LOW (ref 8.9–10.3)
Chloride: 105 mmol/L (ref 98–111)
Creatinine, Ser: 0.84 mg/dL (ref 0.44–1.00)
GFR, Estimated: >60 mL/min
Glucose, Bld: 114 mg/dL — ABNORMAL HIGH (ref 70–99)
Potassium: 3.6 mmol/L (ref 3.5–5.1)
Sodium: 136 mmol/L (ref 135–145)

## 2023-08-08 LAB — CBC
HCT: 38.3 % (ref 36.0–46.0)
Hemoglobin: 12.7 g/dL (ref 12.0–15.0)
MCH: 30.1 pg (ref 26.0–34.0)
MCHC: 33.2 g/dL (ref 30.0–36.0)
MCV: 90.8 fL (ref 80.0–100.0)
Platelets: 224 10*3/uL (ref 150–400)
RBC: 4.22 MIL/uL (ref 3.87–5.11)
RDW: 13.1 % (ref 11.5–15.5)
WBC: 6.3 10*3/uL (ref 4.0–10.5)
nRBC: 0 % (ref 0.0–0.2)

## 2023-08-08 LAB — PROTIME-INR
INR: 1.2 (ref 0.8–1.2)
Prothrombin Time: 15.4 s — ABNORMAL HIGH (ref 11.4–15.2)

## 2023-08-08 MED ORDER — WARFARIN SODIUM 2.5 MG PO TABS
3.7500 mg | ORAL_TABLET | Freq: Once | ORAL | Status: AC
  Administered 2023-08-08: 3.75 mg via ORAL
  Filled 2023-08-08: qty 1

## 2023-08-08 MED ORDER — METHADONE HCL 10 MG PO TABS
10.0000 mg | ORAL_TABLET | Freq: Four times a day (QID) | ORAL | Status: DC
  Administered 2023-08-08 (×3): 10 mg via ORAL
  Filled 2023-08-08 (×13): qty 1

## 2023-08-08 MED ORDER — SODIUM CHLORIDE 0.9 % IV SOLN: INTRAVENOUS | Status: AC

## 2023-08-08 MED ORDER — OXYCODONE HCL 5 MG PO TABS
5.0000 mg | ORAL_TABLET | Freq: Four times a day (QID) | ORAL | Status: DC | PRN
  Administered 2023-08-09 – 2023-08-12 (×9): 10 mg via ORAL
  Administered 2023-08-13: 5 mg via ORAL
  Administered 2023-08-13: 10 mg via ORAL
  Filled 2023-08-08 (×13): qty 2

## 2023-08-08 MED ORDER — PANCRELIPASE (LIP-PROT-AMYL) 12000-38000 UNITS PO CPEP
36000.0000 [IU] | ORAL_CAPSULE | Freq: Three times a day (TID) | ORAL | Status: DC
  Administered 2023-08-08 – 2023-08-13 (×14): 36000 [IU] via ORAL
  Filled 2023-08-08 (×15): qty 3

## 2023-08-08 MED ORDER — OXYCODONE HCL 5 MG PO TABS: 5.0000 mg | ORAL_TABLET | ORAL | Status: DC | PRN

## 2023-08-08 MED ORDER — HYDROMORPHONE HCL 1 MG/ML IJ SOLN
1.0000 mg | INTRAMUSCULAR | Status: DC | PRN
  Administered 2023-08-08 – 2023-08-10 (×10): 1 mg via INTRAVENOUS
  Filled 2023-08-08 (×10): qty 1

## 2023-08-08 MED ORDER — BUPRENORPHINE HCL-NALOXONE HCL 8-2 MG SL SUBL: 1.0000 | SUBLINGUAL_TABLET | Freq: Four times a day (QID) | SUBLINGUAL | Status: DC | PRN

## 2023-08-08 NOTE — Progress Notes (Signed)
PHARMACY - ANTICOAGULATION CONSULT NOTE  Pharmacy Consult for Warfarin Indication:  s/p mechanical valve in 2023   Allergies  Allergen Reactions   Latex Anaphylaxis, Hives and Itching    Other reaction(s): Other (See Comments) SKIN BURNING & PEELING    Nsaids Other (See Comments)    Serve ulcers; stomach aches, GERD.  Other Reaction(s): Other (See Comments)   Tomato Anaphylaxis, Hives and Itching   Gabapentin Palpitations    Has heart condition; also makes jittery   Metformin     Other Reaction(s): Abdominal Pain  Causes pancreatitis to worsen   Lisinopril     Other Reaction(s): Angioedema   Tramadol Itching and Hives   Duloxetine Anxiety    Makes her jittery    Patient Measurements: Height: 5\' 7"  (170.2 cm) Weight: 123.4 kg (272 lb 0.8 oz) IBW/kg (Calculated) : 61.6 Heparin Dosing Weight: N/A  Vital Signs: Temp: 98.2 F (36.8 C) (01/22 0230) Temp Source: Oral (01/22 0230) BP: 113/74 (01/22 0230) Pulse Rate: 81 (01/22 0230)  Labs: Recent Labs    08/06/23 0508 08/06/23 1122 08/07/23 0519 08/08/23 0514  HGB 13.8  --   --  12.7  HCT 40.5  --   --  38.3  PLT 251  --   --  224  LABPROT  --  14.8 14.4 15.4*  INR  --  1.1 1.1 1.2  CREATININE 0.74  --   --  0.84    Estimated Creatinine Clearance: 120.1 mL/min (by C-G formula based on SCr of 0.84 mg/dL).   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 Pharmacy was consulted to re-start patient on her home warfarin.  No signs/symptoms of bleeding noted in chart. Hgb 13.8. PLT 251.   Home warfarin dose: 7.5 mg Tues/Thurs and 3.75 mg all other days (TWD 33.75 mg)  INR goal: 1.5-2 Last dose: 08/03/23 Baseline INR:  Date INR Warfarin Dose  1/20 1.1 6 mg   1/21 1.1 7.5 mg   1/22 1.2 3.75 mg (ordered)   Goal of Therapy:  INR 1.5-2 Monitor platelets by anticoagulation protocol: Yes   Plan:  INR subtherapeutic but increasing, likely due to being held 1/17-1/19 Will give warfarin 3.75 mg PO x 1 (home dose, want to avoid overshooting her INR since she has been stable on this dose) Should continue to see a rise in INR the next couple of days, if not, will consider increasing dose slightly Monitor INR daily and CBC at least once weekly  Merryl Hacker, PharmD Clinical Pharmacist  08/08/2023,7:18 AM

## 2023-08-08 NOTE — Progress Notes (Signed)
PROGRESS NOTE    Sarah Sosa  GNF:[id number] DOB: [birth year] DOA: 08/03/2023 PCP: Clinic, [facility]    Brief Narrative:   Sarah Sosa is a 43 y.o. 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  According to patient she last used alcohol 2 days ago.  According to her the pain is so severe that she finds it difficult to tolerate any oral intake.  Denies nausea or vomiting, chest pain, cough or urinary complaint.   Assessment & Plan:   Principal Problem:   Acute pancreatitis Acute on chronic pancreatitis secondary to alcohol use Patient with history of gallstone pancreatitis status post gallbladder removal Presenting with epigastric pain with elevated lipase levels Plan: Restart home chronic pain management Continue as needed hydromorphone Continue IVF Advance to full liquid diet Pancreatic enzyme replacement   Hyponatremia likely secondary to volume depletion Continue IV fluid   History of mechanical valve placed in 2023 Continue warfarin Pharmacy consulted for dosing Monitor INR closely   Hypokalemia Continue repletion and monitoring   Chronic asthma,  Continue as needed nebulization   Chronic pain disorder,  Patient is on Suboxone, methadone, as needed oxycodone.  Home medication regimen has been restarted   GERD,  Continue PPI therapy     History of gallstones status post cholecystectomy No acute intervention at this time  Obesity BMI 42.6.  Complicating factor in overall care and prognosis     DVT prophylaxis: Warfarin Code Status: Full Family Communication: None Disposition Plan: Status is: Inpatient Remains inpatient appropriate because: Acute pancreatitis   Level of care: Med-Surg  Consultants:   None  Procedures:  None  Antimicrobials: None   Subjective: Seen and examined.  Resting comfortably in bed.  No visible distress.  Endorses epigastric abdominal pain.  Objective: Vitals:   08/07/23 1509 08/07/23 2020 08/08/23 0230 08/08/23 0743  BP: 111/75 97/68 113/74 96/74  Pulse: 91 89 81 83  Resp: 16 18 18 18   Temp: 98.6 F (37 C) 98.2 F (36.8 C) 98.2 F (36.8 C) 99.8 F (37.7 C)  TempSrc: Oral Oral Oral Oral  SpO2: 94% 94% 100% 96%  Weight:      Height:        Intake/Output Summary (Last 24 hours) at 08/08/2023 1312 Last data filed at 08/08/2023 0445 Gross per 24 hour  Intake 2805.14 ml  Output --  Net 2805.14 ml   Filed Weights   08/03/23 1010  Weight: 123.4 kg    Examination:  General exam: Appears calm and comfortable  Respiratory system: Clear to auscultation. Respiratory effort normal. Cardiovascular system: S1-S2, 2/6 systolic murmur, no pedal edema Gastrointestinal system: Obese, soft, nondistended, tender to palpation epigastrium, normal bowel sounds Central nervous system: Alert and oriented. No focal neurological deficits. Extremities: Symmetric 5 x 5 power. Skin: No rashes, lesions or ulcers Psychiatry: Judgement and insight appear normal. Mood & affect appropriate.     Data Reviewed: I have personally reviewed following labs and imaging studies  CBC: Recent Labs  Lab 08/03/23 1018 08/04/23 0726 08/05/23 0352 08/06/23 0508 08/08/23 0514  WBC 10.0 7.8 8.5 7.1 6.3  NEUTROABS  --  4.3 5.1 3.8  --   HGB 15.0 12.9 13.1 13.8 12.7  HCT 45.0 38.5 38.8 40.5 38.3  MCV 90.0 89.5 88.4 89.8 90.8  PLT 307 259 256 251 224  Basic Metabolic Panel: Recent Labs  Lab 08/03/23 1018 08/04/23 0726 08/05/23 0352 08/06/23 0508 08/08/23 0514  NA 132* 134* 135 135 136  K 3.2* 3.8 4.0 3.6 3.6  CL 95* 104 102 102 105  CO2 22 24 23 25 24   GLUCOSE 156* 139* 122* 134* 114*  BUN 13 6 <5* 7 9  CREATININE 0.87 0.64 0.67 0.74 0.84  CALCIUM 9.3 8.5*  9.1 9.1 8.6*  MG  --  1.7  --   --   --    GFR: Estimated Creatinine Clearance: 120.1 mL/min (by C-G formula based on SCr of 0.84 mg/dL). Liver Function Tests: Recent Labs  Lab 08/03/23 1018  AST 33  ALT 75*  ALKPHOS 81  BILITOT 1.1  PROT 8.0  ALBUMIN 4.4   Recent Labs  Lab 08/03/23 1018  LIPASE 346*   No results for input(s): "AMMONIA" in the last 168 hours. Coagulation Profile: Recent Labs  Lab 08/06/23 1122 08/07/23 0519 08/08/23 0514  INR 1.1 1.1 1.2   Cardiac Enzymes: No results for input(s): "CKTOTAL", "CKMB", "CKMBINDEX", "TROPONINI" in the last 168 hours. BNP (last 3 results) No results for input(s): "PROBNP" in the last 8760 hours. HbA1C: No results for input(s): "HGBA1C" in the last 72 hours. CBG: No results for input(s): "GLUCAP" in the last 168 hours. Lipid Profile: No results for input(s): "CHOL", "HDL", "LDLCALC", "TRIG", "CHOLHDL", "LDLDIRECT" in the last 72 hours. Thyroid Function Tests: No results for input(s): "TSH", "T4TOTAL", "FREET4", "T3FREE", "THYROIDAB" in the last 72 hours. Anemia Panel: No results for input(s): "VITAMINB12", "FOLATE", "FERRITIN", "TIBC", "IRON", "RETICCTPCT" in the last 72 hours. Sepsis Labs: No results for input(s): "PROCALCITON", "LATICACIDVEN" in the last 168 hours.  Recent Results (from the past 240 hours)  Resp panel by RT-PCR (RSV, Flu A&B, Covid) Anterior Nasal Swab     Status: None   Collection Time: 08/03/23 10:11 AM   Specimen: Anterior Nasal Swab  Result Value Ref Range Status   SARS Coronavirus 2 by RT PCR NEGATIVE NEGATIVE Final    Comment: (NOTE) SARS-CoV-2 target nucleic acids are NOT DETECTED.  The SARS-CoV-2 RNA is generally detectable in upper respiratory specimens during the acute phase of infection. The lowest concentration of SARS-CoV-2 viral copies this assay can detect is 138 copies/mL. A negative result does not preclude SARS-Cov-2 infection and should not be used as the sole basis for  treatment or other patient management decisions. A negative result may occur with  improper specimen collection/handling, submission of specimen other than nasopharyngeal swab, presence of viral mutation(s) within the areas targeted by this assay, and inadequate number of viral copies(<138 copies/mL). A negative result must be combined with clinical observations, patient history, and epidemiological information. The expected result is Negative.  Fact Sheet for Patients:  BloggerCourse.com  Fact Sheet for Healthcare Providers:  SeriousBroker.it  This test is no t yet approved or cleared by the [place] FDA and  has been authorized for detection and/or diagnosis of SARS-CoV-2 by FDA under an Emergency Use Authorization (EUA). This EUA will remain  in effect (meaning this test can be used) for the duration of the COVID-19 declaration under Section 564(b)(1) of the Act, 21 U.S.C.section 360bbb-3(b)(1), unless the authorization is terminated  or revoked sooner.       Influenza A by PCR NEGATIVE NEGATIVE Final   Influenza B by PCR NEGATIVE NEGATIVE Final    Comment: (NOTE) The Xpert Xpress SARS-CoV-2/FLU/RSV plus assay is intended as an aid in the diagnosis of influenza from Nasopharyngeal  swab specimens and should not be used as a sole basis for treatment. Nasal washings and aspirates are unacceptable for Xpert Xpress SARS-CoV-2/FLU/RSV testing.  Fact Sheet for Patients: BloggerCourse.com  Fact Sheet for Healthcare Providers: SeriousBroker.it  This test is not yet approved or cleared by the [place] FDA and has been authorized for detection and/or diagnosis of SARS-CoV-2 by FDA under an Emergency Use Authorization (EUA). This EUA will remain in effect (meaning this test can be used) for the duration of the COVID-19 declaration under Section 564(b)(1) of the Act, 21  U.S.C. section 360bbb-3(b)(1), unless the authorization is terminated or revoked.     Resp Syncytial Virus by PCR NEGATIVE NEGATIVE Final    Comment: (NOTE) Fact Sheet for Patients: BloggerCourse.com  Fact Sheet for Healthcare Providers: SeriousBroker.it  This test is not yet approved or cleared by the [place] FDA and has been authorized for detection and/or diagnosis of SARS-CoV-2 by FDA under an Emergency Use Authorization (EUA). This EUA will remain in effect (meaning this test can be used) for the duration of the COVID-19 declaration under Section 564(b)(1) of the Act, 21 U.S.C. section 360bbb-3(b)(1), unless the authorization is terminated or revoked.  Performed at [facility], [address] [place], [place] [address]          Radiology Studies: No results found.      Scheduled Meds:  amitriptyline  10 mg Oral QHS   amLODipine  10 mg Oral Daily   atorvastatin  40 mg Oral Daily   cyclobenzaprine  10 mg Oral TID   metFORMIN  500 mg Oral Q breakfast   methadone  10 mg Oral QID   pantoprazole  40 mg Oral Daily   spironolactone  25 mg Oral Daily   traZODone  100 mg Oral QHS   warfarin  3.75 mg Oral ONCE-1600   Warfarin - Pharmacist Dosing Inpatient   Does not apply q1600   Continuous Infusions:   LOS: 5 days      Tresa Moore, MD [facility]   If 7PM-7AM, please contact night-coverage  08/08/2023, 1:12 PM

## 2023-08-08 NOTE — Plan of Care (Signed)

## 2023-08-08 NOTE — Progress Notes (Signed)
OT Cancellation Note  Patient Details Name: Sarah Sosa MRN: [id number] DOB: March 19, 1981   Cancelled Treatment:    Reason Eval/Treat Not Completed: Other (comment) Pt sitting up in bed upon OT arrival.  Pt reports she's been getting up to use the bathroom independently and not requiring additional assist with ADLs, and declines additional OT at this time.  Will complete OT order and will reassess as needed with any changes in condition.  Danelle Earthly, MS, OTR/L   Otis Dials 08/08/2023, 2:23 PM

## 2023-08-08 NOTE — Progress Notes (Signed)
Physical Therapy Treatment & Discharge Patient Details Name: Sarah Sosa MRN: [id number] DOB: 04-Apr-1981 Today's Date: 08/08/2023   History of Present Illness Pt is a 43 y.o. female presenting to [facility] ED with abdominal pain and admitted for management of acute/chronic pancreatitis. PMH significant for GERD, chronic pain disorder, gallstones s/p cholecystectomy    PT Comments  Patient is currently functioning at independent level with no AD within the room as patient declines to exit room due to modesty concerns. No complaints of BLE pain at this time and feels a lot better than a couple of days ago. All goals met and no further skilled PT needs identified acutely. PT will complete the orders at this time.     If plan is discharge home, recommend the following:     Can travel by private vehicle        Equipment Recommendations  None recommended by PT    Recommendations for Other Services       Precautions / Restrictions Precautions Precautions: None Restrictions Weight Bearing Restrictions Per Provider Order: No     Mobility  Bed Mobility Overal bed mobility: Independent                  Transfers Overall transfer level: Independent Equipment used: None                    Ambulation/Gait Ambulation/Gait assistance: Independent Gait Distance (Feet): 40 Feet Assistive device: None Gait Pattern/deviations: WFL(Within Functional Limits) Gait velocity: normal     General Gait Details: ambulated within room. Patient declined ambulation outside of room due to preference of staying out of view of other individuals   Stairs             Wheelchair Mobility     Tilt Bed    Modified Rankin (Stroke Patients Only)       Balance Overall balance assessment: Independent                                          Cognition Arousal: Alert Behavior During Therapy: WFL for tasks assessed/performed Overall Cognitive Status: Within  Functional Limits for tasks assessed                                          Exercises      General Comments        Pertinent Vitals/Pain Pain Assessment Pain Assessment: No/denies pain    Home Living                          Prior Function            PT Goals (current goals can now be found in the care plan section) Acute Rehab PT Goals PT Goal Formulation: All assessment and education complete, DC therapy Progress towards PT goals: Goals met/education completed, patient discharged from PT    Frequency    Min 1X/week      PT Plan      Co-evaluation              AM-PAC PT "6 Clicks" Mobility   Outcome Measure  Help needed turning from your back to your side while in a flat bed without using bedrails?: None Help needed moving from  lying on your back to sitting on the side of a flat bed without using bedrails?: None Help needed moving to and from a bed to a chair (including a wheelchair)?: None Help needed standing up from a chair using your arms (e.g., wheelchair or bedside chair)?: None Help needed to walk in hospital room?: None Help needed climbing 3-5 steps with a railing? : None 6 Click Score: 24    End of Session   Activity Tolerance: Patient tolerated treatment well Patient left: in bed;with call bell/phone within reach;with nursing/sitter in room Nurse Communication: Mobility status PT Visit Diagnosis: Muscle weakness (generalized) (M62.81);Difficulty in walking, not elsewhere classified (R26.2)     Time: 5784-6962 PT Time Calculation (min) (ACUTE ONLY): 9 min  Charges:    $Therapeutic Activity: 8-22 mins PT General Charges $$ ACUTE PT VISIT: 1 Visit                     Maylon Peppers, PT, DPT Physical Therapist - [facility]  [facility]    Cinthia Rodden A Amarissa Koerner 08/08/2023, 3:29 PM

## 2023-08-09 DIAGNOSIS — K852 Alcohol induced acute pancreatitis without necrosis or infection: Secondary | ICD-10-CM | POA: Diagnosis not present

## 2023-08-09 LAB — PROTIME-INR
INR: 1.3 — ABNORMAL HIGH (ref 0.8–1.2)
Prothrombin Time: 16.3 s — ABNORMAL HIGH (ref 11.4–15.2)

## 2023-08-09 MED ORDER — WARFARIN SODIUM 7.5 MG PO TABS
7.5000 mg | ORAL_TABLET | Freq: Once | ORAL | Status: AC
  Administered 2023-08-09: 7.5 mg via ORAL
  Filled 2023-08-09: qty 1

## 2023-08-09 NOTE — Progress Notes (Signed)
PHARMACY - ANTICOAGULATION CONSULT NOTE  Pharmacy Consult for Warfarin Indication:  s/p mechanical valve in 2023   Allergies  Allergen Reactions   Latex Anaphylaxis, Hives and Itching    Other reaction(s): Other (See Comments) SKIN BURNING & PEELING    Nsaids Other (See Comments)    Serve ulcers; stomach aches, GERD.  Other Reaction(s): Other (See Comments)   Tomato Anaphylaxis, Hives and Itching   Gabapentin Palpitations    Has heart condition; also makes jittery   Metformin     Other Reaction(s): Abdominal Pain  Causes pancreatitis to worsen   Lisinopril     Other Reaction(s): Angioedema   Tramadol Itching and Hives   Duloxetine Anxiety    Makes her jittery    Patient Measurements: Height: 5\' 7"  (170.2 cm) Weight: 123.4 kg (272 lb 0.8 oz) IBW/kg (Calculated) : 61.6 Heparin Dosing Weight: N/A  Vital Signs: Temp: 97.8 F (36.6 C) (01/23 0700) Temp Source: Oral (01/23 0700) BP: 95/65 (01/23 0700) Pulse Rate: 70 (01/23 0700)  Labs: Recent Labs    08/07/23 0519 08/08/23 0514 08/09/23 0854  HGB  --  12.7  --   HCT  --  38.3  --   PLT  --  224  --   LABPROT 14.4 15.4* 16.3*  INR 1.1 1.2 1.3*  CREATININE  --  0.84  --     Estimated Creatinine Clearance: 120.1 mL/min (by C-G formula based on SCr of 0.84 mg/dL).   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 Pharmacy was consulted to re-start patient on her home warfarin.  No signs/symptoms of bleeding noted in chart. Hgb 13.8. PLT 251.   Home warfarin dose: 7.5 mg Tues/Thurs and 3.75 mg all other days (TWD 33.75 mg)  INR goal: 1.5-2 Last dose: 08/03/23 Baseline INR:  Date INR Warfarin Dose  1/20 1.1 6 mg   1/21 1.1 7.5 mg   1/22 1.2 3.75 mg  1/23 1.3 7.5 mg (ordered)   Goal of Therapy:  INR 1.5-2 Monitor platelets by anticoagulation protocol: Yes   Plan:  INR subtherapeutic but increasing, likely due to being held 1/17-1/19 Will give warfarin 7.5 mg PO x 1 (home dose, want to avoid overshooting her INR since she has been stable on this dose) Should continue to see a rise in INR the next couple of days, if not, will consider increasing dose slightly Monitor INR daily and CBC at least once weekly  Merryl Hacker, PharmD Clinical Pharmacist  08/09/2023,10:58 AM

## 2023-08-09 NOTE — TOC Progression Note (Signed)
Transition of Care [facility]) - Progression Note    Patient Details  Name: Sarah Sosa MRN: [id number] Date of Birth: 09/08/1980  Transition of Care [facility]) CM/SW Contact  Margarito Liner, LCSW Phone Number: 08/09/2023, 12:36 PM  Clinical Narrative:   TOC continues to follow progress.  Expected Discharge Plan: Home/Self Care Barriers to Discharge: Continued Medical Work up  Expected Discharge Plan and Services       Living arrangements for the past 2 months: Single Family Home                                       Social Determinants of Health (SDOH) Interventions SDOH Screenings   Food Insecurity: No Food Insecurity (08/04/2023)  Housing: Low Risk  (08/04/2023)  Recent Concern: Housing - High Risk (08/02/2023)   Received from [facility]  Transportation Needs: No Transportation Needs (08/04/2023)  Utilities: Not At Risk (08/04/2023)  Financial Resource Strain: High Risk (07/19/2023)   Received from [facility]  Social Connections: Moderately Isolated (06/05/2021)   Received from [facility], [facility]  Stress: Stress Concern Present (06/05/2021)   Received from [facility], [facility]  Tobacco Use: High Risk (08/03/2023)    Readmission Risk Interventions    08/04/2023    4:19 PM 07/15/2023    2:29 PM 03/21/2023    9:27 AM  Readmission Risk Prevention Plan  Transportation Screening Complete Complete Complete  PCP or Specialist Appt within 3-5 Days Complete Complete   HRI or Home Care Consult Complete Complete   Social Work Consult for Recovery Care Planning/Counseling Complete Complete   Palliative Care Screening Not Applicable Not Applicable   Medication Review Oceanographer) Complete Complete Complete  PCP or Specialist appointment within 3-5 days of discharge   Complete  HRI or Home Care Consult   Complete  SW Recovery Care/Counseling Consult    Complete  Palliative Care Screening   Not Applicable  Skilled Nursing Facility   Not Applicable

## 2023-08-09 NOTE — Progress Notes (Signed)
PROGRESS NOTE    Sarah Sosa  ZOX:[id number] DOB: 1980-08-12 DOA: 08/03/2023 PCP: [facility]    Brief Narrative:   Sarah Sosa is a 43 y.o. 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  According to patient she last used alcohol 2 days ago.  According to her the pain is so severe that she finds it difficult to tolerate any oral intake.  Denies nausea or vomiting, chest pain, cough or urinary complaint.   Assessment & Plan:   Principal Problem:   Acute pancreatitis Acute on chronic pancreatitis secondary to alcohol use Patient with history of gallstone pancreatitis status post gallbladder removal Presenting with epigastric pain with elevated lipase levels Abdominal pain slowly improving Plan: Restart home chronic pain management Continue as needed hydromorphone Continue IVF for now Advance to soft diet as tolerated Restart pancreatic enzyme replacement  Hyponatremia likely secondary to volume depletion Continue IV fluid   History of mechanical valve placed in 2023 Continue warfarin Pharmacy consulted for dosing Monitor INR closely Goal 2-3   Hypokalemia Continue repletion and monitoring   Chronic asthma,  Continue as needed nebulization   Chronic pain disorder,  Patient is on Suboxone, methadone, as needed oxycodone.   Home medication regimen has been restarted Patient is established with pain doctor as outpatient   GERD,  Continue PPI therapy     History of gallstones status post cholecystectomy No acute intervention at this time  Obesity BMI 42.6.  Complicating factor in overall care and prognosis     DVT prophylaxis: Warfarin Code Status: Full Family Communication: None Disposition Plan: Status is:  Inpatient Remains inpatient appropriate because: Acute pancreatitis   Level of care: Med-Surg  Consultants:  None  Procedures:  None  Antimicrobials: None   Subjective: Patient seen and examined.  Resting in bed.  Endorses abdominal pain persistent but improving over interval.  Attempting to eat more.  Objective: Vitals:   08/08/23 1519 08/08/23 1928 08/09/23 0457 08/09/23 0700  BP: (!) 131/90 104/81 104/69 95/65  Pulse: 90 85 78 70  Resp: 16 19 19 18   Temp: 98.7 F (37.1 C) 97.8 F (36.6 C) 97.9 F (36.6 C) 97.8 F (36.6 C)  TempSrc: Oral Oral Oral Oral  SpO2: 100% 98% 94% 95%  Weight:      Height:        Intake/Output Summary (Last 24 hours) at 08/09/2023 1129 Last data filed at 08/09/2023 1035 Gross per 24 hour  Intake 1689.43 ml  Output --  Net 1689.43 ml   Filed Weights   08/03/23 1010  Weight: 123.4 kg    Examination:  General exam: NAD Respiratory system: Clear to auscultation. Respiratory effort normal. Cardiovascular system: S1-S2, 2/6 systolic murmur, no pedal edema Gastrointestinal system: Obese, soft, nondistended, epigastric tenderness.  Normal bowel sounds  Central nervous system: Alert and oriented. No focal neurological deficits. Extremities: Symmetric 5 x 5 power. Skin: No rashes, lesions or ulcers Psychiatry: Judgement and insight appear normal. Mood & affect appropriate.     Data Reviewed: I have personally reviewed following labs and imaging studies  CBC: Recent Labs  Lab 08/03/23 1018 08/04/23 0726 08/05/23 0352 08/06/23 0508 08/08/23 0514  WBC 10.0 7.8 8.5 7.1 6.3  NEUTROABS  --  4.3 5.1 3.8  --   HGB 15.0 12.9 13.1 13.8 12.7  HCT 45.0  38.5 38.8 40.5 38.3  MCV 90.0 89.5 88.4 89.8 90.8  PLT 307 259 256 251 224   Basic Metabolic Panel: Recent Labs  Lab 08/03/23 1018 08/04/23 0726 08/05/23 0352 08/06/23 0508 08/08/23 0514  NA 132* 134* 135 135 136  K 3.2* 3.8 4.0 3.6 3.6  CL 95* 104 102 102 105  CO2 22 24 23 25  24   GLUCOSE 156* 139* 122* 134* 114*  BUN 13 6 <5* 7 9  CREATININE 0.87 0.64 0.67 0.74 0.84  CALCIUM 9.3 8.5* 9.1 9.1 8.6*  MG  --  1.7  --   --   --    GFR: Estimated Creatinine Clearance: 120.1 mL/min (by C-G formula based on SCr of 0.84 mg/dL). Liver Function Tests: Recent Labs  Lab 08/03/23 1018  AST 33  ALT 75*  ALKPHOS 81  BILITOT 1.1  PROT 8.0  ALBUMIN 4.4   Recent Labs  Lab 08/03/23 1018  LIPASE 346*   No results for input(s): "AMMONIA" in the last 168 hours. Coagulation Profile: Recent Labs  Lab 08/06/23 1122 08/07/23 0519 08/08/23 0514 08/09/23 0854  INR 1.1 1.1 1.2 1.3*   Cardiac Enzymes: No results for input(s): "CKTOTAL", "CKMB", "CKMBINDEX", "TROPONINI" in the last 168 hours. BNP (last 3 results) No results for input(s): "PROBNP" in the last 8760 hours. HbA1C: No results for input(s): "HGBA1C" in the last 72 hours. CBG: No results for input(s): "GLUCAP" in the last 168 hours. Lipid Profile: No results for input(s): "CHOL", "HDL", "LDLCALC", "TRIG", "CHOLHDL", "LDLDIRECT" in the last 72 hours. Thyroid Function Tests: No results for input(s): "TSH", "T4TOTAL", "FREET4", "T3FREE", "THYROIDAB" in the last 72 hours. Anemia Panel: No results for input(s): "VITAMINB12", "FOLATE", "FERRITIN", "TIBC", "IRON", "RETICCTPCT" in the last 72 hours. Sepsis Labs: No results for input(s): "PROCALCITON", "LATICACIDVEN" in the last 168 hours.  Recent Results (from the past 240 hours)  Resp panel by RT-PCR (RSV, Flu A&B, Covid) Anterior Nasal Swab     Status: None   Collection Time: 08/03/23 10:11 AM   Specimen: Anterior Nasal Swab  Result Value Ref Range Status   SARS Coronavirus 2 by RT PCR NEGATIVE NEGATIVE Final    Comment: (NOTE) SARS-CoV-2 target nucleic acids are NOT DETECTED.  The SARS-CoV-2 RNA is generally detectable in upper respiratory specimens during the acute phase of infection. The lowest concentration of SARS-CoV-2 viral copies this assay can  detect is 138 copies/mL. A negative result does not preclude SARS-Cov-2 infection and should not be used as the sole basis for treatment or other patient management decisions. A negative result may occur with  improper specimen collection/handling, submission of specimen other than nasopharyngeal swab, presence of viral mutation(s) within the areas targeted by this assay, and inadequate number of viral copies(<138 copies/mL). A negative result must be combined with clinical observations, patient history, and epidemiological information. The expected result is Negative.  Fact Sheet for Patients:  BloggerCourse.com  Fact Sheet for Healthcare Providers:  SeriousBroker.it  This test is no t yet approved or cleared by the [place] FDA and  has been authorized for detection and/or diagnosis of SARS-CoV-2 by FDA under an Emergency Use Authorization (EUA). This EUA will remain  in effect (meaning this test can be used) for the duration of the COVID-19 declaration under Section 564(b)(1) of the Act, 21 U.S.C.section 360bbb-3(b)(1), unless the authorization is terminated  or revoked sooner.       Influenza A by PCR NEGATIVE NEGATIVE Final   Influenza B by PCR NEGATIVE NEGATIVE Final  Comment: (NOTE) The Xpert Xpress SARS-CoV-2/FLU/RSV plus assay is intended as an aid in the diagnosis of influenza from Nasopharyngeal swab specimens and should not be used as a sole basis for treatment. Nasal washings and aspirates are unacceptable for Xpert Xpress SARS-CoV-2/FLU/RSV testing.  Fact Sheet for Patients: BloggerCourse.com  Fact Sheet for Healthcare Providers: SeriousBroker.it  This test is not yet approved or cleared by the [place] FDA and has been authorized for detection and/or diagnosis of SARS-CoV-2 by FDA under an Emergency Use Authorization (EUA). This EUA will remain in  effect (meaning this test can be used) for the duration of the COVID-19 declaration under Section 564(b)(1) of the Act, 21 U.S.C. section 360bbb-3(b)(1), unless the authorization is terminated or revoked.     Resp Syncytial Virus by PCR NEGATIVE NEGATIVE Final    Comment: (NOTE) Fact Sheet for Patients: BloggerCourse.com  Fact Sheet for Healthcare Providers: SeriousBroker.it  This test is not yet approved or cleared by the [place] FDA and has been authorized for detection and/or diagnosis of SARS-CoV-2 by FDA under an Emergency Use Authorization (EUA). This EUA will remain in effect (meaning this test can be used) for the duration of the COVID-19 declaration under Section 564(b)(1) of the Act, 21 U.S.C. section 360bbb-3(b)(1), unless the authorization is terminated or revoked.  Performed at [facility], [address], [place], [place] [address]          Radiology Studies: No results found.      Scheduled Meds:  amitriptyline  10 mg Oral QHS   amLODipine  10 mg Oral Daily   atorvastatin  40 mg Oral Daily   cyclobenzaprine  10 mg Oral TID   lipase/protease/amylase  36,000 Units Oral TID WC   metFORMIN  500 mg Oral Q breakfast   methadone  10 mg Oral QID   pantoprazole  40 mg Oral Daily   spironolactone  25 mg Oral Daily   traZODone  100 mg Oral QHS   warfarin  7.5 mg Oral ONCE-1600   Warfarin - Pharmacist Dosing Inpatient   Does not apply q1600   Continuous Infusions:  sodium chloride 150 mL/hr at 08/09/23 0856     LOS: 6 days      Tresa Moore, MD [facility]   If 7PM-7AM, please contact night-coverage  08/09/2023, 11:29 AM

## 2023-08-09 NOTE — Plan of Care (Signed)

## 2023-08-10 DIAGNOSIS — K852 Alcohol induced acute pancreatitis without necrosis or infection: Secondary | ICD-10-CM | POA: Diagnosis not present

## 2023-08-10 LAB — PROTIME-INR
INR: 1.2 (ref 0.8–1.2)
Prothrombin Time: 15.5 s — ABNORMAL HIGH (ref 11.4–15.2)

## 2023-08-10 MED ORDER — HYDROMORPHONE HCL 1 MG/ML IJ SOLN
1.0000 mg | Freq: Four times a day (QID) | INTRAMUSCULAR | Status: DC | PRN
  Administered 2023-08-10 – 2023-08-13 (×14): 1 mg via INTRAVENOUS
  Filled 2023-08-10 (×14): qty 1

## 2023-08-10 MED ORDER — WARFARIN SODIUM 6 MG PO TABS
6.0000 mg | ORAL_TABLET | Freq: Once | ORAL | Status: AC
  Administered 2023-08-10: 6 mg via ORAL
  Filled 2023-08-10: qty 1

## 2023-08-10 NOTE — Plan of Care (Signed)

## 2023-08-10 NOTE — Plan of Care (Signed)

## 2023-08-10 NOTE — Progress Notes (Signed)
PHARMACY - ANTICOAGULATION CONSULT NOTE  Pharmacy Consult for Warfarin Indication:  s/p mechanical valve in 2023   Allergies  Allergen Reactions   Latex Anaphylaxis, Hives and Itching    Other reaction(s): Other (See Comments) SKIN BURNING & PEELING    Nsaids Other (See Comments)    Serve ulcers; stomach aches, GERD.  Other Reaction(s): Other (See Comments)   Tomato Anaphylaxis, Hives and Itching   Gabapentin Palpitations    Has heart condition; also makes jittery   Metformin     Other Reaction(s): Abdominal Pain  Causes pancreatitis to worsen   Lisinopril     Other Reaction(s): Angioedema   Tramadol Itching and Hives   Duloxetine Anxiety    Makes her jittery    Patient Measurements: Height: 5\' 7"  (170.2 cm) Weight: 123.4 kg (272 lb 0.8 oz) IBW/kg (Calculated) : 61.6 Heparin Dosing Weight: N/A  Vital Signs: Temp: 98.1 F (36.7 C) (01/24 0411) Temp Source: Oral (01/24 0411) BP: 120/71 (01/24 0411) Pulse Rate: 71 (01/24 0411)  Labs: Recent Labs    08/08/23 0514 08/09/23 0854 08/10/23 0603  HGB 12.7  --   --   HCT 38.3  --   --   PLT 224  --   --   LABPROT 15.4* 16.3* 15.5*  INR 1.2 1.3* 1.2  CREATININE 0.84  --   --     Estimated Creatinine Clearance: 120.1 mL/min (by C-G formula based on SCr of 0.84 mg/dL).   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 Pharmacy was consulted to re-start patient on her home warfarin.  No signs/symptoms of bleeding noted in chart. Hgb 13.8. PLT 251.   Home warfarin dose: 7.5 mg Tues/Thurs and 3.75 mg all other days (TWD 33.75 mg)  INR goal: 1.5-2 Last dose: 08/03/23 Baseline INR:  Date INR Warfarin Dose  1/20 1.1 6 mg   1/21 1.1 7.5 mg   1/22 1.2 3.75 mg  1/23 1.3 7.5 mg   1/24 1.2 6 mg (ordered)   Goal of Therapy:  INR 1.5-2 Monitor platelets by anticoagulation protocol: Yes   Plan:  INR subtherapeutic and decreased from yesterday, subtherapeutic INR likely due to being held 1/17-1/19 Will give warfarin 6 mg PO x 1 (~1.5 x home dose to hopefully boost her into a therapeutic INR) Monitor INR daily and CBC at least once weekly  Merryl Hacker, PharmD Clinical Pharmacist  08/10/2023,7:17 AM

## 2023-08-10 NOTE — Progress Notes (Signed)
PROGRESS NOTE    Sarah Sosa  ZOX:[id number] DOB: 08-Jan-1981 DOA: 08/03/2023 PCP: [facility]    Brief Narrative:   Sarah Sosa is a 43 y.o. 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  According to patient she last used alcohol 2 days ago.  According to her the pain is so severe that she finds it difficult to tolerate any oral intake.  Denies nausea or vomiting, chest pain, cough or urinary complaint.   Assessment & Plan:   Principal Problem:   Acute pancreatitis Acute on chronic pancreatitis secondary to alcohol use Patient with history of gallstone pancreatitis status post gallbladder removal Presenting with epigastric pain with elevated lipase levels Abdominal pain slowly improving Plan: Continue home chronic pain management Continue as needed hydromorphone, frequency weaned No need for IVF Continue soft diet as tolerated Continue pancreatic enzyme replacement  Hyponatremia likely secondary to volume depletion Resolved   History of mechanical valve placed in 2023 Continue warfarin Pharmacy consulted for dosing Monitor INR closely Goal 2-3   Hypokalemia Continue repletion and monitoring   Chronic asthma,  Continue as needed nebulization   Chronic pain disorder,  Patient is on Suboxone, methadone, as needed oxycodone.   Home medication regimen has been restarted Patient is established with pain doctor as outpatient   GERD,  Continue PPI therapy     History of gallstones status post cholecystectomy No acute intervention at this time  Obesity BMI 42.6.  Complicating factor in overall care and prognosis     DVT prophylaxis: Warfarin Code Status: Full Family Communication: None Disposition Plan: Status is:  Inpatient Remains inpatient appropriate because: Acute pancreatitis   Level of care: Med-Surg  Consultants:  None  Procedures:  None  Antimicrobials: None   Subjective: Patient seen and examined.  No visible distress.  Appears mildly tearful this morning. Objective: Vitals:   08/09/23 2036 08/09/23 2231 08/10/23 0411 08/10/23 0816  BP: (!) 131/105 (!) 138/99 120/71 133/70  Pulse: 96 83 71 66  Resp: 18  18 16   Temp: 98.6 F (37 C)  98.1 F (36.7 C) 97.8 F (36.6 C)  TempSrc: Oral  Oral Oral  SpO2: 99%  100% 98%  Weight:      Height:        Intake/Output Summary (Last 24 hours) at 08/10/2023 1325 Last data filed at 08/10/2023 1000 Gross per 24 hour  Intake 1440 ml  Output --  Net 1440 ml   Filed Weights   08/03/23 1010  Weight: 123.4 kg    Examination:  General exam: NAD Respiratory system: Clear to auscultation. Respiratory effort normal. Cardiovascular system: S1-S2, 2/6 systolic murmur, no pedal edema Gastrointestinal system: Obese, soft, nondistended, tenderness in epigastrium, normal bowel sounds Central nervous system: Alert and oriented. No focal neurological deficits. Extremities: Symmetric 5 x 5 power. Skin: No rashes, lesions or ulcers Psychiatry: Judgement and insight appear normal. Mood & affect appropriate.     Data Reviewed: I have personally reviewed following labs and imaging studies  CBC: Recent Labs  Lab 08/04/23 0726 08/05/23 0352 08/06/23 0508 08/08/23 0514  WBC 7.8 8.5 7.1 6.3  NEUTROABS 4.3 5.1 3.8  --   HGB 12.9 13.1 13.8 12.7  HCT 38.5 38.8 40.5 38.3  MCV 89.5 88.4 89.8 90.8  PLT 259 256 251 224   Basic Metabolic Panel:  Recent Labs  Lab 08/04/23 0726 08/05/23 0352 08/06/23 0508 08/08/23 0514  NA 134* 135 135 136  K 3.8 4.0 3.6 3.6  CL 104 102 102 105  CO2 24 23 25 24   GLUCOSE 139* 122* 134* 114*  BUN 6 <5* 7 9  CREATININE 0.64 0.67 0.74 0.84  CALCIUM 8.5* 9.1 9.1 8.6*  MG 1.7  --   --   --     GFR: Estimated Creatinine Clearance: 120.1 mL/min (by C-G formula based on SCr of 0.84 mg/dL). Liver Function Tests: No results for input(s): "AST", "ALT", "ALKPHOS", "BILITOT", "PROT", "ALBUMIN" in the last 168 hours.  No results for input(s): "LIPASE", "AMYLASE" in the last 168 hours.  No results for input(s): "AMMONIA" in the last 168 hours. Coagulation Profile: Recent Labs  Lab 08/06/23 1122 08/07/23 0519 08/08/23 0514 08/09/23 0854 08/10/23 0603  INR 1.1 1.1 1.2 1.3* 1.2   Cardiac Enzymes: No results for input(s): "CKTOTAL", "CKMB", "CKMBINDEX", "TROPONINI" in the last 168 hours. BNP (last 3 results) No results for input(s): "PROBNP" in the last 8760 hours. HbA1C: No results for input(s): "HGBA1C" in the last 72 hours. CBG: No results for input(s): "GLUCAP" in the last 168 hours. Lipid Profile: No results for input(s): "CHOL", "HDL", "LDLCALC", "TRIG", "CHOLHDL", "LDLDIRECT" in the last 72 hours. Thyroid Function Tests: No results for input(s): "TSH", "T4TOTAL", "FREET4", "T3FREE", "THYROIDAB" in the last 72 hours. Anemia Panel: No results for input(s): "VITAMINB12", "FOLATE", "FERRITIN", "TIBC", "IRON", "RETICCTPCT" in the last 72 hours. Sepsis Labs: No results for input(s): "PROCALCITON", "LATICACIDVEN" in the last 168 hours.  Recent Results (from the past 240 hours)  Resp panel by RT-PCR (RSV, Flu A&B, Covid) Anterior Nasal Swab     Status: None   Collection Time: 08/03/23 10:11 AM   Specimen: Anterior Nasal Swab  Result Value Ref Range Status   SARS Coronavirus 2 by RT PCR NEGATIVE NEGATIVE Final    Comment: (NOTE) SARS-CoV-2 target nucleic acids are NOT DETECTED.  The SARS-CoV-2 RNA is generally detectable in upper respiratory specimens during the acute phase of infection. The lowest concentration of SARS-CoV-2 viral copies this assay can detect is 138 copies/mL. A negative result does not preclude SARS-Cov-2 infection and should not be used as the sole  basis for treatment or other patient management decisions. A negative result may occur with  improper specimen collection/handling, submission of specimen other than nasopharyngeal swab, presence of viral mutation(s) within the areas targeted by this assay, and inadequate number of viral copies(<138 copies/mL). A negative result must be combined with clinical observations, patient history, and epidemiological information. The expected result is Negative.  Fact Sheet for Patients:  BloggerCourse.com  Fact Sheet for Healthcare Providers:  SeriousBroker.it  This test is no t yet approved or cleared by the [place] FDA and  has been authorized for detection and/or diagnosis of SARS-CoV-2 by FDA under an Emergency Use Authorization (EUA). This EUA will remain  in effect (meaning this test can be used) for the duration of the COVID-19 declaration under Section 564(b)(1) of the Act, 21 U.S.C.section 360bbb-3(b)(1), unless the authorization is terminated  or revoked sooner.       Influenza A by PCR NEGATIVE NEGATIVE Final   Influenza B by PCR NEGATIVE NEGATIVE Final    Comment: (NOTE) The Xpert Xpress SARS-CoV-2/FLU/RSV plus assay is intended as an aid in the diagnosis of influenza from Nasopharyngeal swab specimens and should not be used as a sole basis for treatment. Nasal washings and aspirates are unacceptable  for Xpert Xpress SARS-CoV-2/FLU/RSV testing.  Fact Sheet for Patients: BloggerCourse.com  Fact Sheet for Healthcare Providers: SeriousBroker.it  This test is not yet approved or cleared by the [place] FDA and has been authorized for detection and/or diagnosis of SARS-CoV-2 by FDA under an Emergency Use Authorization (EUA). This EUA will remain in effect (meaning this test can be used) for the duration of the COVID-19 declaration under Section 564(b)(1) of the Act,  21 U.S.C. section 360bbb-3(b)(1), unless the authorization is terminated or revoked.     Resp Syncytial Virus by PCR NEGATIVE NEGATIVE Final    Comment: (NOTE) Fact Sheet for Patients: BloggerCourse.com  Fact Sheet for Healthcare Providers: SeriousBroker.it  This test is not yet approved or cleared by the [place] FDA and has been authorized for detection and/or diagnosis of SARS-CoV-2 by FDA under an Emergency Use Authorization (EUA). This EUA will remain in effect (meaning this test can be used) for the duration of the COVID-19 declaration under Section 564(b)(1) of the Act, 21 U.S.C. section 360bbb-3(b)(1), unless the authorization is terminated or revoked.  Performed at [facility], [address], [place], [place] [address]          Radiology Studies: No results found.      Scheduled Meds:  amitriptyline  10 mg Oral QHS   amLODipine  10 mg Oral Daily   atorvastatin  40 mg Oral Daily   cyclobenzaprine  10 mg Oral TID   lipase/protease/amylase  36,000 Units Oral TID WC   metFORMIN  500 mg Oral Q breakfast   methadone  10 mg Oral QID   pantoprazole  40 mg Oral Daily   spironolactone  25 mg Oral Daily   traZODone  100 mg Oral QHS   warfarin  6 mg Oral ONCE-1600   Warfarin - Pharmacist Dosing Inpatient   Does not apply q1600   Continuous Infusions:     LOS: 7 days      Tresa Moore, MD [facility]   If 7PM-7AM, please contact night-coverage  08/10/2023, 1:25 PM

## 2023-08-11 DIAGNOSIS — K852 Alcohol induced acute pancreatitis without necrosis or infection: Secondary | ICD-10-CM | POA: Diagnosis not present

## 2023-08-11 LAB — PROTIME-INR
INR: 1.3 — ABNORMAL HIGH (ref 0.8–1.2)
Prothrombin Time: 16.5 s — ABNORMAL HIGH (ref 11.4–15.2)

## 2023-08-11 MED ORDER — WARFARIN SODIUM 7.5 MG PO TABS
7.5000 mg | ORAL_TABLET | Freq: Once | ORAL | Status: AC
  Administered 2023-08-11: 7.5 mg via ORAL
  Filled 2023-08-11: qty 1

## 2023-08-11 NOTE — Plan of Care (Signed)
  Problem: Education: Goal: Knowledge of General Education information will improve Description: Including pain rating scale, medication(s)/side effects and non-pharmacologic comfort measures Outcome: Progressing   Problem: Clinical Measurements: Goal: Ability to maintain clinical measurements within normal limits will improve Outcome: Progressing Goal: Will remain free from infection Outcome: Progressing Goal: Diagnostic test results will improve Outcome: Progressing Goal: Respiratory complications will improve Outcome: Progressing Goal: Cardiovascular complication will be avoided Outcome: Progressing   Problem: Nutrition: Goal: Adequate nutrition will be maintained Outcome: Progressing   Problem: Pain Managment: Goal: General experience of comfort will improve and/or be controlled Outcome: Progressing   Problem: Safety: Goal: Ability to remain free from injury will improve Outcome: Progressing

## 2023-08-11 NOTE — Progress Notes (Signed)
PHARMACY - ANTICOAGULATION CONSULT NOTE  Pharmacy Consult for Warfarin Indication:  s/p mechanical valve in 2023   Allergies  Allergen Reactions   Latex Anaphylaxis, Hives and Itching    Other reaction(s): Other (See Comments) SKIN BURNING & PEELING    Nsaids Other (See Comments)    Serve ulcers; stomach aches, GERD.  Other Reaction(s): Other (See Comments)   Tomato Anaphylaxis, Hives and Itching   Gabapentin Palpitations    Has heart condition; also makes jittery   Metformin     Other Reaction(s): Abdominal Pain  Causes pancreatitis to worsen   Lisinopril     Other Reaction(s): Angioedema   Tramadol Itching and Hives   Duloxetine Anxiety    Makes her jittery    Patient Measurements: Height: 5\' 7"  (170.2 cm) Weight: 123.4 kg (272 lb 0.8 oz) IBW/kg (Calculated) : 61.6 Heparin Dosing Weight: N/A  Vital Signs: Temp: 97.9 F (36.6 C) (01/25 0748) Temp Source: Oral (01/25 0748) BP: 122/76 (01/25 0748) Pulse Rate: 74 (01/25 0748)  Labs: Recent Labs    08/09/23 0854 08/10/23 0603 08/11/23 0432  LABPROT 16.3* 15.5* 16.5*  INR 1.3* 1.2 1.3*    Estimated Creatinine Clearance: 120.1 mL/min (by C-G formula based on SCr of 0.84 mg/dL).   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 Pharmacy was consulted to re-start patient on her home  warfarin.  No signs/symptoms of bleeding noted in chart. Hgb 13.8. PLT 251.   Home warfarin dose: 7.5 mg Tues/Thurs and 3.75 mg all other days (TWD 33.75 mg)  INR goal: 1.5-2 Last dose: 08/03/23 Baseline INR:  Date INR Warfarin Dose  1/20 1.1 6 mg   1/21 1.1 7.5 mg   1/22 1.2 3.75 mg  1/23 1.3 7.5 mg   1/24 1.2 6 mg (ordered)  1/25 1.3    Goal of Therapy:  INR 1.5-2 Monitor platelets by anticoagulation protocol: Yes   Plan:  INR subtherapeutic  Will give warfarin 7.5 mg PO x 1 (~1.5 x home dose to hopefully boost her into a therapeutic INR) Monitor INR daily and CBC at least once weekly  Bettey Costa, PharmD Clinical Pharmacist  08/11/2023,9:06 AM

## 2023-08-11 NOTE — Progress Notes (Signed)
PROGRESS NOTE    Sarah Sosa  VFI:[id number] DOB: December 29, 1980 DOA: 08/03/2023 PCP: [facility]    Brief Narrative:   Sarah Sosa is a 43 y.o. 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  According to patient she last used alcohol 2 days ago.  According to her the pain is so severe that she finds it difficult to tolerate any oral intake.  Denies nausea or vomiting, chest pain, cough or urinary complaint.   Assessment & Plan:   Principal Problem:   Acute pancreatitis Acute on chronic pancreatitis secondary to alcohol use Patient with history of gallstone pancreatitis status post gallbladder removal Presenting with epigastric pain with elevated lipase levels Abdominal pain slowly improving Plan: Continue home chronic pain management Continue as needed hydromorphone, continue to wean No need for IVF Continue soft diet as tolerated Continue pancreatic enzyme replacement  Hyponatremia likely secondary to volume depletion Resolved   History of mechanical valve placed in 2023 Continue warfarin Pharmacy consulted for dosing Monitor INR closely Goal INR 1.5-2   Hypokalemia Continue repletion and monitoring   Chronic asthma,  Continue as needed nebulization   Chronic pain disorder,  Patient is on Suboxone, methadone, as needed oxycodone.   Home medication regimen has been restarted Patient is established with pain doctor as outpatient   GERD,  Continue PPI therapy     History of gallstones status post cholecystectomy No acute intervention at this time  Obesity BMI 42.6.  Complicating factor in overall care and prognosis     DVT prophylaxis: Warfarin Code Status: Full Family Communication: None Disposition Plan: Status is:  Inpatient Remains inpatient appropriate because: Acute pancreatitis   Level of care: Med-Surg  Consultants:  None  Procedures:  None  Antimicrobials: None   Subjective: Patient seen and examined.  No visible distress.  Appears to be doing better this morning.    Objective: Vitals:   08/10/23 2004 08/10/23 2319 08/11/23 0501 08/11/23 0748  BP:   124/84 122/76  Pulse:   72 74  Resp: 16 16 16 16   Temp:   97.7 F (36.5 C) 97.9 F (36.6 C)  TempSrc:   Oral Oral  SpO2:   99% 96%  Weight:      Height:        Intake/Output Summary (Last 24 hours) at 08/11/2023 1217 Last data filed at 08/10/2023 2007 Gross per 24 hour  Intake 120 ml  Output --  Net 120 ml   Filed Weights   08/03/23 1010  Weight: 123.4 kg    Examination:  General exam: No acute distress Respiratory system: Clear to auscultation. Respiratory effort normal. Cardiovascular system: S1-S2, 2/6 systolic murmur, no pedal edema Gastrointestinal system:, Soft, nondistended, TTP epigastrium, normal bowel sounds Central nervous system: Alert and oriented. No focal neurological deficits. Extremities: Symmetric 5 x 5 power. Skin: No rashes, lesions or ulcers Psychiatry: Judgement and insight appear normal. Mood & affect appropriate.     Data Reviewed: I have personally reviewed following labs and imaging studies  CBC: Recent Labs  Lab 08/05/23 0352 08/06/23 0508 08/08/23 0514  WBC 8.5 7.1 6.3  NEUTROABS 5.1 3.8  --   HGB 13.1 13.8 12.7  HCT 38.8 40.5 38.3  MCV 88.4 89.8 90.8  PLT 256 251 224   Basic Metabolic Panel: Recent Labs  Lab 08/05/23 0352  08/06/23 0508 08/08/23 0514  NA 135 135 136  K 4.0 3.6 3.6  CL 102 102 105  CO2 23 25 24   GLUCOSE 122* 134* 114*  BUN <5* 7 9  CREATININE 0.67 0.74 0.84  CALCIUM 9.1 9.1 8.6*   GFR: Estimated Creatinine Clearance: 120.1 mL/min (by C-G formula based on SCr of 0.84 mg/dL). Liver Function Tests: No results for input(s): "AST", "ALT", "ALKPHOS",  "BILITOT", "PROT", "ALBUMIN" in the last 168 hours.  No results for input(s): "LIPASE", "AMYLASE" in the last 168 hours.  No results for input(s): "AMMONIA" in the last 168 hours. Coagulation Profile: Recent Labs  Lab 08/07/23 0519 08/08/23 0514 08/09/23 0854 08/10/23 0603 08/11/23 0432  INR 1.1 1.2 1.3* 1.2 1.3*   Cardiac Enzymes: No results for input(s): "CKTOTAL", "CKMB", "CKMBINDEX", "TROPONINI" in the last 168 hours. BNP (last 3 results) No results for input(s): "PROBNP" in the last 8760 hours. HbA1C: No results for input(s): "HGBA1C" in the last 72 hours. CBG: No results for input(s): "GLUCAP" in the last 168 hours. Lipid Profile: No results for input(s): "CHOL", "HDL", "LDLCALC", "TRIG", "CHOLHDL", "LDLDIRECT" in the last 72 hours. Thyroid Function Tests: No results for input(s): "TSH", "T4TOTAL", "FREET4", "T3FREE", "THYROIDAB" in the last 72 hours. Anemia Panel: No results for input(s): "VITAMINB12", "FOLATE", "FERRITIN", "TIBC", "IRON", "RETICCTPCT" in the last 72 hours. Sepsis Labs: No results for input(s): "PROCALCITON", "LATICACIDVEN" in the last 168 hours.  Recent Results (from the past 240 hours)  Resp panel by RT-PCR (RSV, Flu A&B, Covid) Anterior Nasal Swab     Status: None   Collection Time: 08/03/23 10:11 AM   Specimen: Anterior Nasal Swab  Result Value Ref Range Status   SARS Coronavirus 2 by RT PCR NEGATIVE NEGATIVE Final    Comment: (NOTE) SARS-CoV-2 target nucleic acids are NOT DETECTED.  The SARS-CoV-2 RNA is generally detectable in upper respiratory specimens during the acute phase of infection. The lowest concentration of SARS-CoV-2 viral copies this assay can detect is 138 copies/mL. A negative result does not preclude SARS-Cov-2 infection and should not be used as the sole basis for treatment or other patient management decisions. A negative result may occur with  improper specimen collection/handling, submission of specimen other than  nasopharyngeal swab, presence of viral mutation(s) within the areas targeted by this assay, and inadequate number of viral copies(<138 copies/mL). A negative result must be combined with clinical observations, patient history, and epidemiological information. The expected result is Negative.  Fact Sheet for Patients:  BloggerCourse.com  Fact Sheet for Healthcare Providers:  SeriousBroker.it  This test is no t yet approved or cleared by the [place] FDA and  has been authorized for detection and/or diagnosis of SARS-CoV-2 by FDA under an Emergency Use Authorization (EUA). This EUA will remain  in effect (meaning this test can be used) for the duration of the COVID-19 declaration under Section 564(b)(1) of the Act, 21 U.S.C.section 360bbb-3(b)(1), unless the authorization is terminated  or revoked sooner.       Influenza A by PCR NEGATIVE NEGATIVE Final   Influenza B by PCR NEGATIVE NEGATIVE Final    Comment: (NOTE) The Xpert Xpress SARS-CoV-2/FLU/RSV plus assay is intended as an aid in the diagnosis of influenza from Nasopharyngeal swab specimens and should not be used as a sole basis for treatment. Nasal washings and aspirates are unacceptable for Xpert Xpress SARS-CoV-2/FLU/RSV testing.  Fact Sheet for Patients: BloggerCourse.com  Fact Sheet for Healthcare Providers: SeriousBroker.it  This test is not yet approved or cleared by  the [employer] and has been authorized for detection and/or diagnosis of SARS-CoV-2 by FDA under an Emergency Use Authorization (EUA). This EUA will remain in effect (meaning this test can be used) for the duration of the COVID-19 declaration under Section 564(b)(1) of the Act, 21 U.S.C. section 360bbb-3(b)(1), unless the authorization is terminated or revoked.     Resp Syncytial Virus by PCR NEGATIVE NEGATIVE Final    Comment:  (NOTE) Fact Sheet for Patients: BloggerCourse.com  Fact Sheet for Healthcare Providers: SeriousBroker.it  This test is not yet approved or cleared by the [place] FDA and has been authorized for detection and/or diagnosis of SARS-CoV-2 by FDA under an Emergency Use Authorization (EUA). This EUA will remain in effect (meaning this test can be used) for the duration of the COVID-19 declaration under Section 564(b)(1) of the Act, 21 U.S.C. section 360bbb-3(b)(1), unless the authorization is terminated or revoked.  Performed at [facility], [address], [place], [place] [address]          Radiology Studies: No results found.      Scheduled Meds:  amitriptyline  10 mg Oral QHS   amLODipine  10 mg Oral Daily   atorvastatin  40 mg Oral Daily   cyclobenzaprine  10 mg Oral TID   lipase/protease/amylase  36,000 Units Oral TID WC   metFORMIN  500 mg Oral Q breakfast   methadone  10 mg Oral QID   pantoprazole  40 mg Oral Daily   spironolactone  25 mg Oral Daily   traZODone  100 mg Oral QHS   warfarin  7.5 mg Oral ONCE-1600   Warfarin - Pharmacist Dosing Inpatient   Does not apply q1600   Continuous Infusions:     LOS: 8 days      Tresa Moore, MD [facility]   If 7PM-7AM, please contact night-coverage  08/11/2023, 12:17 PM

## 2023-08-11 NOTE — Plan of Care (Signed)
Problem: Health Behavior/Discharge Planning: Goal: Ability to manage health-related needs will improve Outcome: Progressing

## 2023-08-12 DIAGNOSIS — K852 Alcohol induced acute pancreatitis without necrosis or infection: Secondary | ICD-10-CM | POA: Diagnosis not present

## 2023-08-12 LAB — CBC WITH DIFFERENTIAL/PLATELET
Abs Immature Granulocytes: 0.02 10*3/uL (ref 0.00–0.07)
Basophils Absolute: 0 10*3/uL (ref 0.0–0.1)
Basophils Relative: 1 %
Eosinophils Absolute: 0.3 10*3/uL (ref 0.0–0.5)
Eosinophils Relative: 5 %
HCT: 32.7 % — ABNORMAL LOW (ref 36.0–46.0)
Hemoglobin: 11 g/dL — ABNORMAL LOW (ref 12.0–15.0)
Immature Granulocytes: 0 %
Lymphocytes Relative: 33 %
Lymphs Abs: 2 10*3/uL (ref 0.7–4.0)
MCH: 30.6 pg (ref 26.0–34.0)
MCHC: 33.6 g/dL (ref 30.0–36.0)
MCV: 90.8 fL (ref 80.0–100.0)
Monocytes Absolute: 0.4 10*3/uL (ref 0.1–1.0)
Monocytes Relative: 6 %
Neutro Abs: 3.4 10*3/uL (ref 1.7–7.7)
Neutrophils Relative %: 55 %
Platelets: 224 10*3/uL (ref 150–400)
RBC: 3.6 MIL/uL — ABNORMAL LOW (ref 3.87–5.11)
RDW: 12.7 % (ref 11.5–15.5)
WBC: 6.1 10*3/uL (ref 4.0–10.5)
nRBC: 0 % (ref 0.0–0.2)

## 2023-08-12 LAB — PROTIME-INR
INR: 1.4 — ABNORMAL HIGH (ref 0.8–1.2)
Prothrombin Time: 16.9 s — ABNORMAL HIGH (ref 11.4–15.2)

## 2023-08-12 LAB — BASIC METABOLIC PANEL
Anion gap: 7 (ref 5–15)
BUN: 10 mg/dL (ref 6–20)
CO2: 24 mmol/L (ref 22–32)
Calcium: 8.2 mg/dL — ABNORMAL LOW (ref 8.9–10.3)
Chloride: 104 mmol/L (ref 98–111)
Creatinine, Ser: 0.76 mg/dL (ref 0.44–1.00)
GFR, Estimated: >60 mL/min (ref >60)
Glucose, Bld: 154 mg/dL — ABNORMAL HIGH (ref 70–99)
Potassium: 3 mmol/L — ABNORMAL LOW (ref 3.5–5.1)
Sodium: 135 mmol/L (ref 135–145)

## 2023-08-12 MED ORDER — POTASSIUM CHLORIDE CRYS ER 20 MEQ PO TBCR
40.0000 meq | EXTENDED_RELEASE_TABLET | Freq: Once | ORAL | Status: DC
  Filled 2023-08-12: qty 2

## 2023-08-12 MED ORDER — WARFARIN SODIUM 7.5 MG PO TABS
7.5000 mg | ORAL_TABLET | Freq: Once | ORAL | Status: AC
  Administered 2023-08-12: 7.5 mg via ORAL
  Filled 2023-08-12: qty 1

## 2023-08-12 MED ORDER — POTASSIUM CHLORIDE 20 MEQ PO PACK
40.0000 meq | PACK | Freq: Once | ORAL | Status: AC
  Administered 2023-08-12: 40 meq via ORAL
  Filled 2023-08-12: qty 2

## 2023-08-12 NOTE — TOC Progression Note (Signed)
Transition of Care [facility]) - Progression Note    Patient Details  Name: Sarah Sosa MRN: [id number] Date of Birth: 01/22/1981  Transition of Care [facility]) CM/SW Contact  Rodney Langton, RN Phone Number: 08/12/2023, 11:04 AM  Clinical Narrative:     [facility] team continues to follow progress for potential needs.   Expected Discharge Plan: Home/Self Care Barriers to Discharge: Continued Medical Work up  Expected Discharge Plan and Services       Living arrangements for the past 2 months: Single Family Home                                       Social Determinants of Health (SDOH) Interventions SDOH Screenings   Food Insecurity: No Food Insecurity (08/04/2023)  Housing: Low Risk  (08/04/2023)  Recent Concern: Housing - High Risk (08/02/2023)   Received from [facility]  Transportation Needs: No Transportation Needs (08/04/2023)  Utilities: Not At Risk (08/04/2023)  Financial Resource Strain: High Risk (07/19/2023)   Received from [facility]  Social Connections: Moderately Isolated (06/05/2021)   Received [facility], [facility]  Stress: Stress Concern Present (06/05/2021)   Received from [facility], [facility]  Tobacco Use: High Risk (08/03/2023)    Readmission Risk Interventions    08/04/2023    4:19 PM 07/15/2023    2:29 PM 03/21/2023    9:27 AM  Readmission Risk Prevention Plan  Transportation Screening Complete Complete Complete  PCP or Specialist Appt within 3-5 Days Complete Complete   HRI or Home Care Consult Complete Complete   Social Work Consult for Recovery Care Planning/Counseling Complete Complete   Palliative Care Screening Not Applicable Not Applicable   Medication Review Oceanographer) Complete Complete Complete  PCP or Specialist appointment within 3-5 days of discharge   Complete  HRI or Home Care Consult   Complete  SW Recovery  Care/Counseling Consult   Complete  Palliative Care Screening   Not Applicable  Skilled Nursing Facility   Not Applicable

## 2023-08-12 NOTE — Progress Notes (Signed)
PROGRESS NOTE    Sarah Sosa  ZOX:[id number] DOB: [birth year] DOA: 08/03/2023 PCP: [facility]    Brief Narrative:   Sarah Sosa is a 43 y.o. 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  According to patient she last used alcohol 2 days ago.  According to her the pain is so severe that she finds it difficult to tolerate any oral intake.  Denies nausea or vomiting, chest pain, cough or urinary complaint.   Assessment & Plan:   Principal Problem:   Acute pancreatitis  Acute on chronic pancreatitis secondary to alcohol use Patient with history of gallstone pancreatitis status post gallbladder removal Presenting with epigastric pain with elevated lipase levels Abdominal pain slowly improving Plan: Continue home chronic pain management Continue as needed hydromorphone, continue to wean No need for IVF Continue soft diet as tolerated Continue pancreatic enzyme replacement Anticipate discharge drainage 1/27  Hyponatremia likely secondary to volume depletion Resolved   History of mechanical valve placed in 2023 Continue warfarin Pharmacy consulted for dosing Monitor INR closely Goal INR 1.5-2   Hypokalemia Continue repletion and monitoring   Chronic asthma,  Continue as needed nebulization   Chronic pain disorder,  Patient is on Suboxone, methadone, as needed oxycodone.   Home medication regimen has been restarted Patient is established with pain doctor as outpatient   GERD,  Continue PPI therapy     History of gallstones status post cholecystectomy No acute intervention at this time  Obesity BMI 42.6.  Complicating factor in overall care and prognosis     DVT prophylaxis: Warfarin Code Status: Full Family  Communication: None Disposition Plan: Status is: Inpatient Remains inpatient appropriate because: Acute pancreatitis   Level of care: Med-Surg  Consultants:  None  Procedures:  None  Antimicrobials: None   Subjective: Patient seen and examined.  Appears well.  Tolerating p.o. intake.  Objective: Vitals:   08/12/23 0508 08/12/23 0646 08/12/23 0721 08/12/23 1057  BP: 112/63  96/81 (!) 121/54  Pulse: 67  87   Resp: 16 16 18    Temp: (!) 97.4 F (36.3 C)  98.6 F (37 C)   TempSrc: Oral     SpO2: 95%  95%   Weight:      Height:        Intake/Output Summary (Last 24 hours) at 08/12/2023 1439 Last data filed at 08/12/2023 1028 Gross per 24 hour  Intake 480 ml  Output --  Net 480 ml   Filed Weights   08/03/23 1010  Weight: 123.4 kg    Examination:  General exam: NAD Respiratory system: Lungs clear.  Normal work of breathing.  Room air Cardiovascular system: S1-S2, 2/6 systolic murmur, no pedal edema Gastrointestinal system:, Soft, nondistended, TTP epigastrium, normal bowel sounds Central nervous system: Alert and oriented. No focal neurological deficits. Extremities: Symmetric 5 x 5 power. Skin: No rashes, lesions or ulcers Psychiatry: Judgement and insight appear normal. Mood & affect appropriate.     Data Reviewed: I have personally reviewed following labs and imaging studies  CBC: Recent Labs  Lab 08/06/23 0508 08/08/23 0514 08/12/23 0453  WBC 7.1 6.3 6.1  NEUTROABS 3.8  --  3.4  HGB 13.8 12.7 11.0*  HCT 40.5 38.3 32.7*  MCV 89.8 90.8 90.8  PLT 251 224 224   Basic Metabolic Panel: Recent Labs  Lab  08/06/23 0508 08/08/23 0514 08/12/23 0453  NA 135 136 135  K 3.6 3.6 3.0*  CL 102 105 104  CO2 25 24 24   GLUCOSE 134* 114* 154*  BUN 7 9 10   CREATININE 0.74 0.84 0.76  CALCIUM 9.1 8.6* 8.2*   GFR: Estimated Creatinine Clearance: 126.1 mL/min (by C-G formula based on SCr of 0.76 mg/dL). Liver Function Tests: No results for input(s): "AST",  "ALT", "ALKPHOS", "BILITOT", "PROT", "ALBUMIN" in the last 168 hours.  No results for input(s): "LIPASE", "AMYLASE" in the last 168 hours.  No results for input(s): "AMMONIA" in the last 168 hours. Coagulation Profile: Recent Labs  Lab 08/08/23 0514 08/09/23 0854 08/10/23 0603 08/11/23 0432 08/12/23 0453  INR 1.2 1.3* 1.2 1.3* 1.4*   Cardiac Enzymes: No results for input(s): "CKTOTAL", "CKMB", "CKMBINDEX", "TROPONINI" in the last 168 hours. BNP (last 3 results) No results for input(s): "PROBNP" in the last 8760 hours. HbA1C: No results for input(s): "HGBA1C" in the last 72 hours. CBG: No results for input(s): "GLUCAP" in the last 168 hours. Lipid Profile: No results for input(s): "CHOL", "HDL", "LDLCALC", "TRIG", "CHOLHDL", "LDLDIRECT" in the last 72 hours. Thyroid Function Tests: No results for input(s): "TSH", "T4TOTAL", "FREET4", "T3FREE", "THYROIDAB" in the last 72 hours. Anemia Panel: No results for input(s): "VITAMINB12", "FOLATE", "FERRITIN", "TIBC", "IRON", "RETICCTPCT" in the last 72 hours. Sepsis Labs: No results for input(s): "PROCALCITON", "LATICACIDVEN" in the last 168 hours.  Recent Results (from the past 240 hours)  Resp panel by RT-PCR (RSV, Flu A&B, Covid) Anterior Nasal Swab     Status: None   Collection Time: 08/03/23 10:11 AM   Specimen: Anterior Nasal Swab  Result Value Ref Range Status   SARS Coronavirus 2 by RT PCR NEGATIVE NEGATIVE Final    Comment: (NOTE) SARS-CoV-2 target nucleic acids are NOT DETECTED.  The SARS-CoV-2 RNA is generally detectable in upper respiratory specimens during the acute phase of infection. The lowest concentration of SARS-CoV-2 viral copies this assay can detect is 138 copies/mL. A negative result does not preclude SARS-Cov-2 infection and should not be used as the sole basis for treatment or other patient management decisions. A negative result may occur with  improper specimen collection/handling, submission of  specimen other than nasopharyngeal swab, presence of viral mutation(s) within the areas targeted by this assay, and inadequate number of viral copies(<138 copies/mL). A negative result must be combined with clinical observations, patient history, and epidemiological information. The expected result is Negative.  Fact Sheet for Patients:  BloggerCourse.com  Fact Sheet for Healthcare Providers:  SeriousBroker.it  This test is no t yet approved or cleared by the [place] FDA and  has been authorized for detection and/or diagnosis of SARS-CoV-2 by FDA under an Emergency Use Authorization (EUA). This EUA will remain  in effect (meaning this test can be used) for the duration of the COVID-19 declaration under Section 564(b)(1) of the Act, 21 U.S.C.section 360bbb-3(b)(1), unless the authorization is terminated  or revoked sooner.       Influenza A by PCR NEGATIVE NEGATIVE Final   Influenza B by PCR NEGATIVE NEGATIVE Final    Comment: (NOTE) The Xpert Xpress SARS-CoV-2/FLU/RSV plus assay is intended as an aid in the diagnosis of influenza from Nasopharyngeal swab specimens and should not be used as a sole basis for treatment. Nasal washings and aspirates are unacceptable for Xpert Xpress SARS-CoV-2/FLU/RSV testing.  Fact Sheet for Patients: BloggerCourse.com  Fact Sheet for Healthcare Providers: SeriousBroker.it  This test is not yet approved or  cleared by the [place] and has been authorized for detection and/or diagnosis of SARS-CoV-2 by FDA under an Emergency Use Authorization (EUA). This EUA will remain in effect (meaning this test can be used) for the duration of the COVID-19 declaration under Section 564(b)(1) of the Act, 21 U.S.C. section 360bbb-3(b)(1), unless the authorization is terminated or revoked.     Resp Syncytial Virus by PCR NEGATIVE NEGATIVE Final     Comment: (NOTE) Fact Sheet for Patients: BloggerCourse.com  Fact Sheet for Healthcare Providers: SeriousBroker.it  This test is not yet approved or cleared by the [place] FDA and has been authorized for detection and/or diagnosis of SARS-CoV-2 by FDA under an Emergency Use Authorization (EUA). This EUA will remain in effect (meaning this test can be used) for the duration of the COVID-19 declaration under Section 564(b)(1) of the Act, 21 U.S.C. section 360bbb-3(b)(1), unless the authorization is terminated or revoked.  Performed at [facility], [address], [place], [place] [address]          Radiology Studies: No results found.      Scheduled Meds:  amitriptyline  10 mg Oral QHS   amLODipine  10 mg Oral Daily   atorvastatin  40 mg Oral Daily   cyclobenzaprine  10 mg Oral TID   lipase/protease/amylase  36,000 Units Oral TID WC   metFORMIN  500 mg Oral Q breakfast   methadone  10 mg Oral QID   pantoprazole  40 mg Oral Daily   potassium chloride  40 mEq Oral Once   spironolactone  25 mg Oral Daily   traZODone  100 mg Oral QHS   warfarin  7.5 mg Oral ONCE-1600   Warfarin - Pharmacist Dosing Inpatient   Does not apply q1600   Continuous Infusions:     LOS: 9 days      Tresa Moore, MD [facility]   If 7PM-7AM, please contact night-coverage  08/12/2023, 2:39 PM

## 2023-08-12 NOTE — Plan of Care (Signed)

## 2023-08-12 NOTE — Plan of Care (Signed)

## 2023-08-12 NOTE — Progress Notes (Signed)
PHARMACY - ANTICOAGULATION CONSULT NOTE  Pharmacy Consult for Warfarin Indication:  s/p mechanical valve in 2023   Allergies  Allergen Reactions   Latex Anaphylaxis, Hives and Itching    Other reaction(s): Other (See Comments) SKIN BURNING & PEELING    Nsaids Other (See Comments)    Serve ulcers; stomach aches, GERD.  Other Reaction(s): Other (See Comments)   Tomato Anaphylaxis, Hives and Itching   Gabapentin Palpitations    Has heart condition; also makes jittery   Metformin     Other Reaction(s): Abdominal Pain  Causes pancreatitis to worsen   Lisinopril     Other Reaction(s): Angioedema   Tramadol Itching and Hives   Duloxetine Anxiety    Makes her jittery    Patient Measurements: Height: 5\' 7"  (170.2 cm) Weight: 123.4 kg (272 lb 0.8 oz) IBW/kg (Calculated) : 61.6 Heparin Dosing Weight: N/A  Vital Signs: Temp: 98.6 F (37 C) (01/26 0721) Temp Source: Oral (01/26 0508) BP: 96/81 (01/26 0721) Pulse Rate: 87 (01/26 0721)  Labs: Recent Labs    08/10/23 0603 08/11/23 0432 08/12/23 0453  HGB  --   --  11.0*  HCT  --   --  32.7*  PLT  --   --  224  LABPROT 15.5* 16.5* 16.9*  INR 1.2 1.3* 1.4*  CREATININE  --   --  0.76    Estimated Creatinine Clearance: 126.1 mL/min (by C-G formula based on SCr of 0.76 mg/dL).   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 Pharmacy was consulted to re-start patient on her home warfarin.  No signs/symptoms of bleeding noted in chart. Hgb 13.8. PLT 251.   Home warfarin dose: 7.5 mg Tues/Thurs and 3.75 mg all other days (TWD 33.75 mg)  INR goal: 1.5-2 Last dose: 08/03/23 Baseline INR:  Date INR Warfarin Dose  1/20 1.1 6 mg   1/21 1.1 7.5 mg   1/22 1.2 3.75 mg  1/23 1.3 7.5 mg   1/24 1.2 6 mg (ordered)  1/25 1.3 7.5 mg  1/26 1.4    Goal of Therapy:  INR 1.5-2 Monitor platelets by anticoagulation protocol: Yes   Plan:  INR subtherapeutic  Will give warfarin 7.5 mg PO x 1 (~1.5 x home dose to hopefully boost her into a therapeutic INR) Monitor INR daily and CBC at least once weekly  Bettey Costa, PharmD Clinical Pharmacist  08/12/2023,9:36 AM

## 2023-08-13 DIAGNOSIS — K852 Alcohol induced acute pancreatitis without necrosis or infection: Secondary | ICD-10-CM | POA: Diagnosis not present

## 2023-08-13 LAB — PROTIME-INR
INR: 1.6 — ABNORMAL HIGH (ref 0.8–1.2)
Prothrombin Time: 18.9 s — ABNORMAL HIGH (ref 11.4–15.2)

## 2023-08-13 MED ORDER — ORAL CARE MOUTH RINSE: 15.0000 mL | OROMUCOSAL | Status: DC | PRN

## 2023-08-13 MED ORDER — PANCRELIPASE (LIP-PROT-AMYL) 36000-114000 UNITS PO CPEP: 36000.0000 [IU] | ORAL_CAPSULE | Freq: Three times a day (TID) | ORAL | 0 refills | Status: AC

## 2023-08-13 MED ORDER — WARFARIN SODIUM 6 MG PO TABS
6.0000 mg | ORAL_TABLET | Freq: Once | ORAL | Status: AC
  Administered 2023-08-13: 6 mg via ORAL
  Filled 2023-08-13: qty 1

## 2023-08-13 MED ORDER — ONDANSETRON HCL 4 MG PO TABS: 4.0000 mg | ORAL_TABLET | Freq: Every day | ORAL | 1 refills | Status: DC | PRN

## 2023-08-13 NOTE — Progress Notes (Addendum)
PHARMACY - ANTICOAGULATION CONSULT NOTE  Pharmacy Consult for Warfarin Indication:  s/p aortic mechanical valve in 2023   Allergies  Allergen Reactions   Latex Anaphylaxis, Hives and Itching    Other reaction(s): Other (See Comments) SKIN BURNING & PEELING    Nsaids Other (See Comments)    Serve ulcers; stomach aches, GERD.  Other Reaction(s): Other (See Comments)   Tomato Anaphylaxis, Hives and Itching   Gabapentin Palpitations    Has heart condition; also makes jittery   Metformin     Other Reaction(s): Abdominal Pain  Causes pancreatitis to worsen   Lisinopril     Other Reaction(s): Angioedema   Tramadol Itching and Hives   Duloxetine Anxiety    Makes her jittery    Patient Measurements: Height: 5\' 7"  (170.2 cm) Weight: 123.4 kg (272 lb 0.8 oz) IBW/kg (Calculated) : 61.6 Heparin Dosing Weight: N/A  Vital Signs: Temp: 98.2 F (36.8 C) (01/27 0901) Temp Source: Oral (01/27 0901) BP: 104/73 (01/27 0901) Pulse Rate: 77 (01/27 0901)  Labs: Recent Labs    08/11/23 0432 08/12/23 0453 08/13/23 0514  HGB  --  11.0*  --   HCT  --  32.7*  --   PLT  --  224  --   LABPROT 16.5* 16.9* 18.9*  INR 1.3* 1.4* 1.6*  CREATININE  --  0.76  --     Estimated Creatinine Clearance: 126.1 mL/min (by C-G formula based on SCr of 0.76 mg/dL).   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 Pharmacy was consulted to re-start patient on her home warfarin.  No signs/symptoms of bleeding noted in chart. Hgb 13.8. PLT 251.   Home warfarin dose: 7.5 mg Tues/Thurs and 3.75 mg all other days (TWD 33.75 mg)  INR goal: 1.5-2.0 Last dose: 08/03/23 Baseline INR:  Date INR Warfarin Dose  1/20 1.1 6 mg   1/21 1.1 7.5 mg   1/22 1.2 3.75 mg  1/23 1.3 7.5 mg   1/24 1.2 6 mg (ordered)  1/25 1.3 7.5 mg  1/26 1.4 7.5 mg  1/27 1.6        Goal of Therapy:  INR 1.5-2.0 Monitor platelets by anticoagulation protocol: Yes   Plan:  INR therapeutic  Will order warfarin 6 mg PO x 1  Monitor INR daily and CBC at least once weekly  Angelique Blonder, PharmD Clinical Pharmacist  08/13/2023,9:37 AM

## 2023-08-13 NOTE — Plan of Care (Signed)

## 2023-08-13 NOTE — Discharge Summary (Signed)
Physician Discharge Summary  Sarah Sosa ZOX:[id number] DOB: 13-May-1981 DOA: 08/03/2023  PCP: [facility]  Admit date: 08/03/2023 Discharge date: 08/13/2023  Admitted From: Home Disposition:  Home  Recommendations for Outpatient Follow-up:  Follow up with PCP in 1-2 weeks   Home Health: No Equipment/Devices: None  Discharge Condition: Stable CODE STATUS: Full Diet recommendation: Soft/bland  Brief/Interim Summary:  Sarah Sosa is a 43 y.o. 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  According to patient she last used alcohol 2 days ago.  According to her the pain is so severe that she finds it difficult to tolerate any oral intake.  Denies nausea or vomiting, chest pain, cough or urinary complaint.       Discharge Diagnoses:  Principal Problem:   Acute pancreatitis Acute on chronic pancreatitis secondary to alcohol use Patient with history of gallstone pancreatitis status post gallbladder removal Presenting with epigastric pain with elevated lipase levels Abdominal pain slowly improving Tolerating soft diet at day of discharge Plan: Discharge home.  Patient may have some residual abdominal pain.  She needs to get back on her home narcotic regimen and seek follow-up with her primary care physician.  As needed Zofran and 3 times daily Creon prescribed on DC  History of mechanical valve placed in 2023 Continue warfarin Pharmacy consulted for dosing Monitor INR closely Goal INR 1.5-2 Therapeutic range at time of discharge     Chronic pain disorder,  Patient is on Suboxone, methadone, as needed oxycodone.   Home medication regimen has been restarted This is an unusual regimen.   Patient states she is under a pain contract and  followed by pain management doctor. No changes made at home pain management regimen   Discharge Instructions  Discharge Instructions     Diet - low sodium heart healthy   Complete by: As directed    Increase activity slowly   Complete by: As directed       Allergies as of 08/13/2023       Reactions   Latex Anaphylaxis, Hives, Itching   Other reaction(s): Other (See Comments) SKIN BURNING & PEELING   Nsaids Other (See Comments)   Serve ulcers; stomach aches, GERD. Other Reaction(s): Other (See Comments)   Tomato Anaphylaxis, Hives, Itching   Gabapentin Palpitations   Has heart condition; also makes jittery   Metformin    Other Reaction(s): Abdominal Pain Causes pancreatitis to worsen   Lisinopril    Other Reaction(s): Angioedema   Tramadol Itching, Hives   Duloxetine Anxiety   Makes her jittery        Medication List     PAUSE taking these medications    warfarin 7.5 MG tablet Wait to take this until your doctor or other care provider tells you to start again. Commonly known as: COUMADIN Take 3.25-7.5 mg by mouth daily. Take 3.25mg   M/W/F/S/Sun and T/Th 7.5mg  (1 tab)       STOP taking these medications    aspirin 81 MG chewable tablet   enoxaparin 120 MG/0.8ML injection Commonly known as: LOVENOX       TAKE these medications    amitriptyline 10 MG tablet Commonly known as: ELAVIL Take 10 mg by mouth 3 (three) times daily as needed.   amLODipine 10 MG tablet Commonly known as: NORVASC Take 1 tablet (10 mg total) by  mouth daily.   APPLE CIDER VINEGAR PO Take 1 capsule by mouth 3 (three) times daily.   atorvastatin 40 MG tablet Commonly known as: LIPITOR Take 40 mg by mouth daily.   Buprenorphine HCl-Naloxone HCl 8-2 MG Film Place 1 Film under the tongue 4 (four) times daily as needed.   lipase/protease/amylase 60109 UNITS Cpep capsule Commonly known as: CREON Take 1 capsule (36,000 Units total) by mouth 3 (three) times daily with  meals.   losartan 50 MG tablet Commonly known as: COZAAR Take 1 tablet (50 mg total) by mouth daily.   metFORMIN 500 MG tablet Commonly known as: GLUCOPHAGE Take 500 mg by mouth every morning.   methadone 10 MG tablet Commonly known as: DOLOPHINE Take 10 mg by mouth 4 (four) times daily.   naloxone 4 MG/0.1ML Liqd nasal spray kit Commonly known as: NARCAN Place 1 spray into the nose once as needed (to reverse overdose).   omega-3 acid ethyl esters 1 g capsule Commonly known as: LOVAZA Take 1 g by mouth 2 (two) times daily.   omeprazole 40 MG capsule Commonly known as: PRILOSEC Take 40 mg by mouth daily.   ondansetron 4 MG tablet Commonly known as: Zofran Take 1 tablet (4 mg total) by mouth daily as needed for nausea or vomiting.   Oxycodone HCl 10 MG Tabs Take 1 tablet (10 mg total) by mouth every 6 (six) hours as needed. What changed: how much to take   spironolactone 25 MG tablet Commonly known as: ALDACTONE Take 25 mg by mouth daily.   traZODone 50 MG tablet Commonly known as: DESYREL Take 100 mg by mouth at bedtime.        Follow-up Information     [facility]. Schedule an appointment as soon as possible for a visit on 08/15/2023.   Why: Go at 9:15am Contact information: [address] [place] [address] [phone]                Allergies  Allergen Reactions   Latex Anaphylaxis, Hives and Itching    Other reaction(s): Other (See Comments) SKIN BURNING & PEELING    Nsaids Other (See Comments)    Serve ulcers; stomach aches, GERD.  Other Reaction(s): Other (See Comments)   Tomato Anaphylaxis, Hives and Itching   Gabapentin Palpitations    Has heart condition; also makes jittery   Metformin     Other Reaction(s): Abdominal Pain  Causes pancreatitis to worsen   Lisinopril     Other Reaction(s): Angioedema   Tramadol Itching and Hives   Duloxetine Anxiety    Makes her jittery     Consultations: None   Procedures/Studies: CT ABDOMEN PELVIS W CONTRAST Result Date: 08/05/2023 CLINICAL DATA:  Abdominal pain. History of chronic pancreatitis. Previous cholecystectomy. EXAM: CT ABDOMEN AND PELVIS WITH CONTRAST TECHNIQUE: Multidetector CT imaging of the abdomen and pelvis was performed using the standard protocol following bolus administration of intravenous contrast. RADIATION DOSE REDUCTION: This exam was performed according to the departmental dose-optimization program which includes automated exposure control, adjustment of the mA and/or kV according to patient size and/or use of iterative reconstruction technique. CONTRAST:  OMNIPAQUE IOHEXOL 300 MG/ML  SOLN COMPARISON:  07/13/2023. FINDINGS: Lower chest: No acute abnormality. Hepatobiliary: No focal liver abnormality is seen. Status post cholecystectomy. No biliary dilatation. Pancreas: Subtle haziness suggested in the peripancreatic fat. This is equivocal, but could reflect minimal acute pancreatitis. No pancreatic mass. No duct dilation. No peripancreatic fluid collection. Spleen: Normal in  size without focal abnormality. Adrenals/Urinary Tract: Adrenal glands are unremarkable. Kidneys are normal, without renal calculi, focal lesion, or hydronephrosis. Bladder is unremarkable. Stomach/Bowel: Normal stomach. Small bowel and colon are normal in caliber. No wall thickening. No inflammation. Vascular/Lymphatic: Minor aortic atherosclerosis. No aneurysm. No enlarged lymph nodes. Reproductive: Cystic area above the vaginal cuff on the right, smaller than on the prior exam. Status post hysterectomy. Other: No hernia. Evidence of previous abdominal subcutaneous injections. No ascites. Musculoskeletal: No fracture or acute finding.  No bone lesion. IMPRESSION: 1. Equivocal findings for minimal acute pancreatitis. Correlate with lipase levels. No other evidence of an acute abnormality within the abdomen or pelvis. 2. Minor aortic  atherosclerosis. Aortic Atherosclerosis (ICD10-I70.0). Electronically Signed   By: Amie Portland M.D.   On: 08/05/2023 13:47   ECHOCARDIOGRAM COMPLETE Result Date: 07/16/2023    ECHOCARDIOGRAM REPORT   Patient Name:   Sarah Sosa Date of Exam: 07/16/2023 Medical Rec #:  [id number]     Height:       67.0 in Accession #:    [id number]    Weight:       272.0 lb Date of Birth:  1980/12/15     BSA:          2.305 m Patient Age:    42 years      BP:           188/99 mmHg Patient Gender: F             HR:           54 bpm. Exam Location:  [facility] Procedure: 2D Echo, Cardiac Doppler and Color Doppler Indications:     Elevated Troponin  History:         Patient has prior history of Echocardiogram examinations, most                  recent 05/10/2017. CHF, Abnormal ECG; Risk Factors:Hypertension                  and Current Smoker. ETOH and Substance abuse, S/P Ascending                  Aortic graft. There is a mechanical valve in the Aortic                  position. Procedure done at [facility] 03/2022.  Sonographer:     Mikki Harbor Referring Phys:  [id number] Andris Baumann Diagnosing Phys: Alwyn Pea MD  Sonographer Comments: Patient is obese. IMPRESSIONS  1. Left ventricular ejection fraction, by estimation, is 70 to 75%. The left ventricle has hyperdynamic function. The left ventricle has no regional wall motion abnormalities. There is moderate concentric left ventricular hypertrophy. Left ventricular diastolic parameters are consistent with Grade II diastolic dysfunction (pseudonormalization).  2. Right ventricular systolic function is normal. The right ventricular size is normal.  3. The mitral valve is normal in structure. Trivial mitral valve regurgitation.  4. The aortic valve is normal in structure. Aortic valve regurgitation is not visualized. FINDINGS  Left Ventricle: Left ventricular ejection fraction, by estimation, is 70 to 75%. The left ventricle has hyperdynamic function. The left ventricle has no  regional wall motion abnormalities. The left ventricular internal cavity size was normal in size. There is moderate concentric left ventricular hypertrophy. Left ventricular diastolic parameters are consistent with Grade II diastolic dysfunction (pseudonormalization). Right Ventricle: The right ventricular size is normal. No increase in right ventricular wall thickness. Right ventricular systolic function  is normal. Left Atrium: Left atrial size was normal in size. Right Atrium: Right atrial size was normal in size. Pericardium: There is no evidence of pericardial effusion. Mitral Valve: The mitral valve is normal in structure. Trivial mitral valve regurgitation. MV peak gradient, 5.7 mmHg. The mean mitral valve gradient is 2.0 mmHg. Tricuspid Valve: The tricuspid valve is normal in structure. Tricuspid valve regurgitation is trivial. Aortic Valve: The aortic valve is normal in structure. Aortic valve regurgitation is not visualized. Aortic valve mean gradient measures 24.7 mmHg. Aortic valve peak gradient measures 45.0 mmHg. Aortic valve area, by VTI measures 1.61 cm. Pulmonic Valve: The pulmonic valve was normal in structure. Pulmonic valve regurgitation is not visualized. Aorta: The ascending aorta was not well visualized. IAS/Shunts: No atrial level shunt detected by color flow Doppler.  LEFT VENTRICLE PLAX 2D LVIDd:         4.90 cm   Diastology LVIDs:         2.90 cm   LV e' medial:    6.53 cm/s LV PW:         1.80 cm   LV E/e' medial:  13.7 LV IVS:        1.70 cm   LV e' lateral:   6.09 cm/s LVOT diam:     2.00 cm   LV E/e' lateral: 14.7 LV SV:         107 LV SV Index:   46 LVOT Area:     3.14 cm  RIGHT VENTRICLE RV Basal diam:  4.80 cm RV Mid diam:    3.80 cm RV S prime:     13.10 cm/s LEFT ATRIUM             Index        RIGHT ATRIUM           Index LA diam:        4.70 cm 2.04 cm/m   RA Area:     22.60 cm LA Vol (A2C):   55.7 ml 24.17 ml/m  RA Volume:   69.10 ml  29.98 ml/m LA Vol (A4C):   71.0 ml  30.81 ml/m LA Biplane Vol: 63.2 ml 27.42 ml/m  AORTIC VALVE                     PULMONIC VALVE AV Area (Vmax):    1.59 cm      PV Vmax:       1.21 m/s AV Area (Vmean):   1.44 cm      PV Peak grad:  5.9 mmHg AV Area (VTI):     1.61 cm AV Vmax:           335.33 cm/s AV Vmean:          231.000 cm/s AV VTI:            0.664 m AV Peak Grad:      45.0 mmHg AV Mean Grad:      24.7 mmHg LVOT Vmax:         169.50 cm/s LVOT Vmean:        106.000 cm/s LVOT VTI:          0.340 m LVOT/AV VTI ratio: 0.51  AORTA Ao Root diam: 3.50 cm Ao Asc diam:  3.00 cm MITRAL VALVE MV Area (PHT): 2.48 cm    SHUNTS MV Area VTI:   2.39 cm    Systemic VTI:  0.34 m MV Peak grad:  5.7 mmHg  Systemic Diam: 2.00 cm MV Mean grad:  2.0 mmHg MV Vmax:       1.19 m/s MV Vmean:      59.7 cm/s MV Decel Time: 306 msec MV E velocity: 89.60 cm/s MV A velocity: 71.80 cm/s MV E/A ratio:  1.25 Alwyn Pea MD Electronically signed by Alwyn Pea MD Signature Date/Time: 07/16/2023/6:20:23 PM    Final       Subjective: Seen and examined the day of discharge.  Still having some abdominal pain post meals but tolerating without nausea vomiting.  Stable for discharge home.  Resume home pain med treatment.  Discharge Exam: Vitals:   08/12/23 2015 08/13/23 0901  BP: 106/83 104/73  Pulse:  77  Resp: 18 18  Temp: 98.3 F (36.8 C) 98.2 F (36.8 C)  SpO2: 99% 96%   Vitals:   08/12/23 1057 08/12/23 1608 08/12/23 2015 08/13/23 0901  BP: (!) 121/54 111/87 106/83 104/73  Pulse:  80  77  Resp:  18 18 18   Temp:  98.3 F (36.8 C) 98.3 F (36.8 C) 98.2 F (36.8 C)  TempSrc:   Oral Oral  SpO2:  96% 99% 96%  Weight:      Height:        General: Pt is alert, awake, not in acute distress Cardiovascular: RRR, S1/S2 +, no rubs, no gallops Respiratory: CTA bilaterally, no wheezing, no rhonchi Abdominal: Soft, NT, ND, bowel sounds + Extremities: no edema, no cyanosis    The results of significant diagnostics from this  hospitalization (including imaging, microbiology, ancillary and laboratory) are listed below for reference.     Microbiology: No results found for this or any previous visit (from the past 240 hours).   Labs: BNP (last 3 results) No results for input(s): "BNP" in the last 8760 hours. Basic Metabolic Panel: Recent Labs  Lab 08/08/23 0514 08/12/23 0453  NA 136 135  K 3.6 3.0*  CL 105 104  CO2 24 24  GLUCOSE 114* 154*  BUN 9 10  CREATININE 0.84 0.76  CALCIUM 8.6* 8.2*   Liver Function Tests: No results for input(s): "AST", "ALT", "ALKPHOS", "BILITOT", "PROT", "ALBUMIN" in the last 168 hours. No results for input(s): "LIPASE", "AMYLASE" in the last 168 hours. No results for input(s): "AMMONIA" in the last 168 hours. CBC: Recent Labs  Lab 08/08/23 0514 08/12/23 0453  WBC 6.3 6.1  NEUTROABS  --  3.4  HGB 12.7 11.0*  HCT 38.3 32.7*  MCV 90.8 90.8  PLT 224 224   Cardiac Enzymes: No results for input(s): "CKTOTAL", "CKMB", "CKMBINDEX", "TROPONINI" in the last 168 hours. BNP: Invalid input(s): "POCBNP" CBG: No results for input(s): "GLUCAP" in the last 168 hours. D-Dimer No results for input(s): "DDIMER" in the last 72 hours. Hgb A1c No results for input(s): "HGBA1C" in the last 72 hours. Lipid Profile No results for input(s): "CHOL", "HDL", "LDLCALC", "TRIG", "CHOLHDL", "LDLDIRECT" in the last 72 hours. Thyroid function studies No results for input(s): "TSH", "T4TOTAL", "T3FREE", "THYROIDAB" in the last 72 hours.  Invalid input(s): "FREET3" Anemia work up No results for input(s): "VITAMINB12", "FOLATE", "FERRITIN", "TIBC", "IRON", "RETICCTPCT" in the last 72 hours. Urinalysis    Component Value Date/Time   COLORURINE STRAW (A) 08/05/2023 0926   APPEARANCEUR HAZY (A) 08/05/2023 0926   LABSPEC 1.004 (L) 08/05/2023 0926   PHURINE 7.0 08/05/2023 0926   GLUCOSEU NEGATIVE 08/05/2023 0926   HGBUR SMALL (A) 08/05/2023 0926   BILIRUBINUR NEGATIVE 08/05/2023 0926    KETONESUR NEGATIVE 08/05/2023 5784  PROTEINUR 30 (A) 08/05/2023 0926   NITRITE NEGATIVE 08/05/2023 0926   LEUKOCYTESUR NEGATIVE 08/05/2023 0926   Sepsis Labs Recent Labs  Lab 08/08/23 0514 08/12/23 0453  WBC 6.3 6.1   Microbiology No results found for this or any previous visit (from the past 240 hours).   Time coordinating discharge: Over 30 minutes  SIGNED:   Tresa Moore, MD  [facility] 08/13/2023, 12:49 PM Pager   If 7PM-7AM, please contact night-coverage

## 2023-08-13 NOTE — TOC Transition Note (Signed)
Transition of Care [facility]) - Discharge Note   Patient Details  Name: Sarah Sosa MRN: [id number] Date of Birth: [birth year]  [facility] CM/SW Contact:  Chapman Fitch, RN Phone Number: 08/13/2023, 11:05 AM   Clinical Narrative:      Patient to discharge today Per MD not TOC needs indicated for discharge   Barriers to Discharge: Continued Medical Work up   Patient Goals and CMS Choice            Discharge Placement                       Discharge Plan and Services Additional resources added to the After Visit Summary for                                       Social Drivers of Health (SDOH) Interventions SDOH Screenings   Food Insecurity: No Food Insecurity (08/04/2023)  Housing: Low Risk  (08/04/2023)  Recent Concern: Housing - High Risk (08/02/2023)   Received from [facility]  Transportation Needs: No Transportation Needs (08/04/2023)  Utilities: Not At Risk (08/04/2023)  Financial Resource Strain: High Risk (07/19/2023)   [facility]  Social Connections: Moderately Isolated (06/05/2021)   Received from [facility], [facility]  Stress: Stress Concern Present (06/05/2021)   Received from [facility], [facility]  Tobacco Use: High Risk (08/03/2023)     Readmission Risk Interventions    08/04/2023    4:19 PM 07/15/2023    2:29 PM 03/21/2023    9:27 AM  Readmission Risk Prevention Plan  Transportation Screening Complete Complete Complete  PCP or Specialist Appt within 3-5 Days Complete Complete   HRI or Home Care Consult Complete Complete   Social Work Consult for Recovery Care Planning/Counseling Complete Complete   Palliative Care Screening Not Applicable Not Applicable   Medication Review Oceanographer) Complete Complete Complete  PCP or Specialist appointment within 3-5 days of discharge   Complete  HRI  or Home Care Consult   Complete  SW Recovery Care/Counseling Consult   Complete  Palliative Care Screening   Not Applicable  Skilled Nursing Facility   Not Applicable

## 2023-08-13 NOTE — Plan of Care (Signed)

## 2023-09-11 ENCOUNTER — Emergency Department: Payer: Medicaid Other

## 2023-09-11 ENCOUNTER — Other Ambulatory Visit: Payer: Self-pay

## 2023-09-11 ENCOUNTER — Emergency Department
Admission: EM | Admit: 2023-09-11 | Discharge: 2023-09-11 | Disposition: A | Discharge status: Home / Self Care | Payer: Medicaid Other | Attending: Emergency Medicine | Admitting: Emergency Medicine

## 2023-09-11 DIAGNOSIS — R197 Diarrhea, unspecified: Secondary | ICD-10-CM | POA: Diagnosis present

## 2023-09-11 DIAGNOSIS — R1013 Epigastric pain: Secondary | ICD-10-CM | POA: Insufficient documentation

## 2023-09-11 DIAGNOSIS — R110 Nausea: Secondary | ICD-10-CM | POA: Insufficient documentation

## 2023-09-11 DIAGNOSIS — J45909 Unspecified asthma, uncomplicated: Secondary | ICD-10-CM | POA: Diagnosis not present

## 2023-09-11 DIAGNOSIS — K860 Alcohol-induced chronic pancreatitis: Secondary | ICD-10-CM

## 2023-09-11 DIAGNOSIS — R1010 Upper abdominal pain, unspecified: Secondary | ICD-10-CM

## 2023-09-11 DIAGNOSIS — E876 Hypokalemia: Secondary | ICD-10-CM | POA: Diagnosis not present

## 2023-09-11 LAB — URINALYSIS, ROUTINE W REFLEX MICROSCOPIC
Bilirubin Urine: NEGATIVE
Glucose, UA: NEGATIVE mg/dL
Hgb urine dipstick: NEGATIVE
Ketones, ur: NEGATIVE mg/dL
Leukocytes,Ua: NEGATIVE
Nitrite: NEGATIVE
Protein, ur: 30 mg/dL — AB
Specific Gravity, Urine: >1.046 — ABNORMAL HIGH (ref 1.005–1.030)
pH: 6 (ref 5.0–8.0)

## 2023-09-11 LAB — CBC WITH DIFFERENTIAL/PLATELET
Abs Immature Granulocytes: 0.02 10*3/uL (ref 0.00–0.07)
Basophils Absolute: 0 10*3/uL (ref 0.0–0.1)
Basophils Relative: 0 %
Eosinophils Absolute: 0.2 10*3/uL (ref 0.0–0.5)
Eosinophils Relative: 2 %
HCT: 41.3 % (ref 36.0–46.0)
Hemoglobin: 13.8 g/dL (ref 12.0–15.0)
Immature Granulocytes: 0 %
Lymphocytes Relative: 29 %
Lymphs Abs: 2.1 10*3/uL (ref 0.7–4.0)
MCH: 29.9 pg (ref 26.0–34.0)
MCHC: 33.4 g/dL (ref 30.0–36.0)
MCV: 89.4 fL (ref 80.0–100.0)
Monocytes Absolute: 0.3 10*3/uL (ref 0.1–1.0)
Monocytes Relative: 5 %
Neutro Abs: 4.6 10*3/uL (ref 1.7–7.7)
Neutrophils Relative %: 64 %
Platelets: 286 10*3/uL (ref 150–400)
RBC: 4.62 MIL/uL (ref 3.87–5.11)
RDW: 13.3 % (ref 11.5–15.5)
WBC: 7.2 10*3/uL (ref 4.0–10.5)
nRBC: 0 % (ref 0.0–0.2)

## 2023-09-11 LAB — COMPREHENSIVE METABOLIC PANEL
ALT: 16 U/L (ref 0–44)
AST: 25 U/L (ref 15–41)
Albumin: 4 g/dL (ref 3.5–5.0)
Alkaline Phosphatase: 72 U/L (ref 38–126)
Anion gap: 11 (ref 5–15)
BUN: 9 mg/dL (ref 6–20)
CO2: 22 mmol/L (ref 22–32)
Calcium: 9 mg/dL (ref 8.9–10.3)
Chloride: 107 mmol/L (ref 98–111)
Creatinine, Ser: 0.87 mg/dL (ref 0.44–1.00)
GFR, Estimated: >60 mL/min (ref >60)
Glucose, Bld: 144 mg/dL — ABNORMAL HIGH (ref 70–99)
Potassium: 3.1 mmol/L — ABNORMAL LOW (ref 3.5–5.1)
Sodium: 140 mmol/L (ref 135–145)
Total Bilirubin: 0.6 mg/dL (ref 0.0–1.2)
Total Protein: 7.4 g/dL (ref 6.5–8.1)

## 2023-09-11 LAB — LIPASE, BLOOD: Lipase: 20 U/L (ref 11–51)

## 2023-09-11 MED ORDER — OXYCODONE-ACETAMINOPHEN 7.5-325 MG PO TABS: 1.0000 | ORAL_TABLET | ORAL | 0 refills | Status: AC | PRN

## 2023-09-11 MED ORDER — SODIUM CHLORIDE 0.9 % IV BOLUS
500.0000 mL | Freq: Once | INTRAVENOUS | Status: AC
  Administered 2023-09-11: 500 mL via INTRAVENOUS

## 2023-09-11 MED ORDER — IOHEXOL 300 MG/ML  SOLN
100.0000 mL | Freq: Once | INTRAMUSCULAR | Status: AC | PRN
  Administered 2023-09-11: 100 mL via INTRAVENOUS

## 2023-09-11 MED ORDER — HYDROMORPHONE HCL 1 MG/ML IJ SOLN
1.0000 mg | Freq: Once | INTRAMUSCULAR | Status: AC
  Administered 2023-09-11: 1 mg via INTRAVENOUS
  Filled 2023-09-11: qty 1

## 2023-09-11 MED ORDER — OXYCODONE-ACETAMINOPHEN 7.5-325 MG PO TABS: 1.0000 | ORAL_TABLET | ORAL | 0 refills | Status: DC | PRN

## 2023-09-11 NOTE — ED Notes (Signed)
 See triage notes. Patient c/o shortness of breath and abdominal pain. The shortness of breath has been going on since January when the patient was diagnosed with pneumonia. The patient recently started Ozempic (three weeks ago) and has a history of pancreatitis

## 2023-09-11 NOTE — ED Triage Notes (Signed)
 Patient reports lower abdominal pain for approximately 5 days; has history of Pancreatitis.

## 2023-09-11 NOTE — ED Provider Notes (Signed)
 [facility] Provider Note    None    (approximate)   History   Abdominal Pain   HPI  Sarah Sosa is a 43 y.o. female who presents today with history of 4 days with liquid diarrhea x 5/day, nauseas.  Patient denies vomit, fever.  Patient states having history of pneumonia, persistent with cough, patient describes secretions color grayish, wheezing.  Patient has history of asthma.  Patient started using Ozempic 3 weeks ago.  Patient has history of chronic pancreatitis.     Physical Exam   Triage Vital Signs: ED Triage Vitals  Encounter Vitals Group     BP 09/11/23 1607 (!) 156/95     Systolic BP Percentile --      Diastolic BP Percentile --      Pulse Rate 09/11/23 1606 92     Resp 09/11/23 1606 20     Temp 09/11/23 1606 98.2 F (36.8 C)     Temp Source 09/11/23 1606 Oral     SpO2 09/11/23 1606 98 %     Weight 09/11/23 1607 276 lb (125.2 kg)     Height 09/11/23 1607 5\' 7"  (1.702 m)     Head Circumference --      Peak Flow --      Pain Score 09/11/23 1607 9     Pain Loc --      Pain Education --      Exclude from Growth Chart --     Most recent vital signs: Vitals:   09/11/23 1606 09/11/23 1607  BP:  (!) 156/95  Pulse: 92   Resp: 20   Temp: 98.2 F (36.8 C)   SpO2: 98%      Constitutional: Alert, nondistressed Eyes: Conjunctivae are normal.  Head: Atraumatic. Nose: No congestion/rhinnorhea. Mouth/Throat: Mucous membranes are moist.  Enlarged tonsils Neck: Painless ROM.  Cardiovascular:   Presence of thoracostomy scar. Good peripheral circulation.  Blood rhythm, click sound good to mechanical aortic valve. Respiratory: Normal respiratory effort.  No retractions.  No wheezing Gastrointestinal: Soft, tender to palpation in the epigastric area, distended tympanic. Musculoskeletal:  no deformity Neurologic:  MAE spontaneously. No gross focal neurologic deficits are appreciated.  Skin:  Skin is warm, dry and intact. No rash  noted. Psychiatric: Mood and affect are normal. Speech and behavior are normal.    ED Results / Procedures / Treatments   Labs (all labs ordered are listed, but only abnormal results are displayed) Labs Reviewed  COMPREHENSIVE METABOLIC PANEL - Abnormal; Notable for the following components:      Result Value   Potassium 3.1 (*)    Glucose, Bld 144 (*)    All other components within normal limits  LIPASE, BLOOD  CBC WITH DIFFERENTIAL/PLATELET  URINALYSIS, ROUTINE W REFLEX MICROSCOPIC     EKG See physician read    RADIOLOGY I independently reviewed and interpreted imaging and agree with radiologists findings.      PROCEDURES:  Critical Care performed:   Procedures   MEDICATIONS ORDERED IN ED: Medications - No data to display   IMPRESSION / MDM / ASSESSMENT AND PLAN / ED COURSE  I reviewed the triage vital signs and the nursing notes.  Differential diagnosis includes, but is not limited to, chronic pancreatitis, dehydration, hypokalemia, bronchitis, pneumonia  Patient's presentation is most consistent with acute complicated illness / injury requiring diagnostic workup.   Patient's diagnosis is consistent with abdominal pain, chronic pancreatitis. I independently reviewed and interpreted imaging and agree with radiologists findings.  Labs are  reassuring. I did review the patient's allergies and medications.patient patient receive IV fluids due to hypokalemia of 3.1 and Dilaudid for abdominal pain .  Reevaluated patient after treatment with improvement of her pain. The patient is in stable and satisfactory condition for discharge home.   Patient will be discharged home with prescriptions for Percocet for 2 days. Patient is to follow up with gastroenterology at [facility] this week as needed or otherwise directed. Patient is given ED precautions to return to the ED for any worsening or new symptoms. Discussed plan of care with patient, answered all of patient's questions,  Patient agreeable to plan of care. Advised patient to take medications according to the instructions on the label. Discussed possible side effects of new medications. Patient verbalized understanding. Clinical Course as of 09/11/23 2114  Tue Sep 11, 2023  1808 Comprehensive metabolic panel(!) Hypokalemia 3.1. [AE]  1808 Lipase, blood Within normal limits [AE]  1808 CBC with Diff White blood cells, hemoglobin, platelets within normal limits [AE]  2108 CT ABDOMEN PELVIS W CONTRAST  No evidence of pancreatitis. No retroperitoneal fluid or inflammation. 2. Post cholecystectomy. No biliary dilatation. 3. Normal bowel. 4. RIGHT adnexal cyst with small amount debris. No change from comparison exam.   [AE]  2108 DG Chest 2 View Postop chest.  No acute cardiopulmonary disease. [AE]    Clinical Course User Index [AE] Gladys Damme, PA-C     FINAL CLINICAL IMPRESSION(S) / ED DIAGNOSES   Final diagnoses:  None     Rx / DC Orders   ED Discharge Orders     None        Note:  This document was prepared using Dragon voice recognition software and may include unintentional dictation errors.   Gladys Damme, PA-C 09/11/23 2119    Corena Herter, MD 09/12/23 [phone]

## 2023-09-11 NOTE — Discharge Instructions (Addendum)
 You have been diagnosed with abdominal pain, chronic pancreatitis.  Please take Percocet 1 tablet by mouth every 4 hours as needed for pain.  Please go to your gastroenterology appointment at [facility] this week for follow-up of your chronic pancreatitis.  Please come back to ED or go to your PCP if you have new symptoms or symptoms worsen

## 2023-09-12 ENCOUNTER — Telehealth: Payer: Self-pay | Admitting: Student

## 2023-09-12 NOTE — Telephone Encounter (Cosign Needed)
 [facility] called today because patient pick up pain medicine 2 days ago for a month.  I canceled the prescription for Percocet.  Sarah Sosa

## 2023-09-12 NOTE — ED Notes (Signed)
 [facility] [address] called with concerns about percocet rx.  Patient has had multiple narcotic prescriptions filled and is also on suboxone.  Reviewed chart with dr Lenard Lance and decision was made to cancel this prescription.  [facility] will cancel.

## 2023-09-15 ENCOUNTER — Encounter: Payer: Self-pay | Admitting: Emergency Medicine

## 2023-09-15 ENCOUNTER — Inpatient Hospital Stay
Admission: EM | Admit: 2023-09-15 | Discharge: 2023-09-22 | DRG: 439 | Disposition: A | Discharge status: Home / Self Care | Attending: Internal Medicine | Admitting: Internal Medicine

## 2023-09-15 ENCOUNTER — Other Ambulatory Visit: Payer: Self-pay

## 2023-09-15 DIAGNOSIS — R079 Chest pain, unspecified: Secondary | ICD-10-CM | POA: Insufficient documentation

## 2023-09-15 DIAGNOSIS — D6869 Other thrombophilia: Secondary | ICD-10-CM

## 2023-09-15 DIAGNOSIS — Z79899 Other long term (current) drug therapy: Secondary | ICD-10-CM

## 2023-09-15 DIAGNOSIS — E119 Type 2 diabetes mellitus without complications: Secondary | ICD-10-CM

## 2023-09-15 DIAGNOSIS — Z952 Presence of prosthetic heart valve: Secondary | ICD-10-CM | POA: Diagnosis not present

## 2023-09-15 DIAGNOSIS — Z7901 Long term (current) use of anticoagulants: Secondary | ICD-10-CM | POA: Diagnosis not present

## 2023-09-15 DIAGNOSIS — J45909 Unspecified asthma, uncomplicated: Secondary | ICD-10-CM | POA: Diagnosis present

## 2023-09-15 DIAGNOSIS — Z8249 Family history of ischemic heart disease and other diseases of the circulatory system: Secondary | ICD-10-CM | POA: Diagnosis not present

## 2023-09-15 DIAGNOSIS — Z7984 Long term (current) use of oral hypoglycemic drugs: Secondary | ICD-10-CM

## 2023-09-15 DIAGNOSIS — F1110 Opioid abuse, uncomplicated: Secondary | ICD-10-CM | POA: Diagnosis present

## 2023-09-15 DIAGNOSIS — Z91040 Latex allergy status: Secondary | ICD-10-CM | POA: Diagnosis not present

## 2023-09-15 DIAGNOSIS — K861 Other chronic pancreatitis: Secondary | ICD-10-CM | POA: Diagnosis present

## 2023-09-15 DIAGNOSIS — E66813 Obesity, class 3: Secondary | ICD-10-CM | POA: Diagnosis present

## 2023-09-15 DIAGNOSIS — Z79891 Long term (current) use of opiate analgesic: Secondary | ICD-10-CM | POA: Diagnosis present

## 2023-09-15 DIAGNOSIS — I110 Hypertensive heart disease with heart failure: Secondary | ICD-10-CM | POA: Diagnosis present

## 2023-09-15 DIAGNOSIS — Z8261 Family history of arthritis: Secondary | ICD-10-CM | POA: Diagnosis not present

## 2023-09-15 DIAGNOSIS — E876 Hypokalemia: Secondary | ICD-10-CM | POA: Diagnosis present

## 2023-09-15 DIAGNOSIS — K859 Acute pancreatitis without necrosis or infection, unspecified: Secondary | ICD-10-CM | POA: Diagnosis present

## 2023-09-15 DIAGNOSIS — Z9049 Acquired absence of other specified parts of digestive tract: Secondary | ICD-10-CM

## 2023-09-15 DIAGNOSIS — R52 Pain, unspecified: Secondary | ICD-10-CM | POA: Insufficient documentation

## 2023-09-15 DIAGNOSIS — Z91018 Allergy to other foods: Secondary | ICD-10-CM

## 2023-09-15 DIAGNOSIS — G8929 Other chronic pain: Secondary | ICD-10-CM | POA: Diagnosis present

## 2023-09-15 DIAGNOSIS — F1010 Alcohol abuse, uncomplicated: Secondary | ICD-10-CM | POA: Diagnosis present

## 2023-09-15 DIAGNOSIS — I5032 Chronic diastolic (congestive) heart failure: Secondary | ICD-10-CM | POA: Diagnosis present

## 2023-09-15 DIAGNOSIS — N949 Unspecified condition associated with female genital organs and menstrual cycle: Secondary | ICD-10-CM | POA: Insufficient documentation

## 2023-09-15 DIAGNOSIS — Z885 Allergy status to narcotic agent status: Secondary | ICD-10-CM

## 2023-09-15 DIAGNOSIS — Z888 Allergy status to other drugs, medicaments and biological substances status: Secondary | ICD-10-CM

## 2023-09-15 DIAGNOSIS — Z90710 Acquired absence of both cervix and uterus: Secondary | ICD-10-CM | POA: Diagnosis not present

## 2023-09-15 DIAGNOSIS — Z886 Allergy status to analgesic agent status: Secondary | ICD-10-CM

## 2023-09-15 DIAGNOSIS — Z6841 Body Mass Index (BMI) 40.0 and over, adult: Secondary | ICD-10-CM | POA: Diagnosis not present

## 2023-09-15 DIAGNOSIS — K853 Drug induced acute pancreatitis without necrosis or infection: Principal | ICD-10-CM | POA: Diagnosis present

## 2023-09-15 DIAGNOSIS — F17210 Nicotine dependence, cigarettes, uncomplicated: Secondary | ICD-10-CM | POA: Diagnosis present

## 2023-09-15 DIAGNOSIS — I10 Essential (primary) hypertension: Secondary | ICD-10-CM | POA: Diagnosis present

## 2023-09-15 DIAGNOSIS — T50995A Adverse effect of other drugs, medicaments and biological substances, initial encounter: Secondary | ICD-10-CM | POA: Diagnosis present

## 2023-09-15 LAB — COMPREHENSIVE METABOLIC PANEL
ALT: 16 U/L (ref 0–44)
AST: 23 U/L (ref 15–41)
Albumin: 4.2 g/dL (ref 3.5–5.0)
Alkaline Phosphatase: 76 U/L (ref 38–126)
Anion gap: 10 (ref 5–15)
BUN: 5 mg/dL — ABNORMAL LOW (ref 6–20)
CO2: 24 mmol/L (ref 22–32)
Calcium: 9 mg/dL (ref 8.9–10.3)
Chloride: 103 mmol/L (ref 98–111)
Creatinine, Ser: 0.72 mg/dL (ref 0.44–1.00)
GFR, Estimated: >60 mL/min (ref >60)
Glucose, Bld: 97 mg/dL (ref 70–99)
Potassium: 3.3 mmol/L — ABNORMAL LOW (ref 3.5–5.1)
Sodium: 137 mmol/L (ref 135–145)
Total Bilirubin: 0.7 mg/dL (ref 0.0–1.2)
Total Protein: 7.7 g/dL (ref 6.5–8.1)

## 2023-09-15 LAB — CBC
HCT: 43.1 % (ref 36.0–46.0)
Hemoglobin: 14.5 g/dL (ref 12.0–15.0)
MCH: 30.4 pg (ref 26.0–34.0)
MCHC: 33.6 g/dL (ref 30.0–36.0)
MCV: 90.4 fL (ref 80.0–100.0)
Platelets: 246 10*3/uL (ref 150–400)
RBC: 4.77 MIL/uL (ref 3.87–5.11)
RDW: 13.2 % (ref 11.5–15.5)
WBC: 6.4 10*3/uL (ref 4.0–10.5)
nRBC: 0 % (ref 0.0–0.2)

## 2023-09-15 LAB — CBG MONITORING, ED: Glucose-Capillary: 91 mg/dL (ref 70–99)

## 2023-09-15 LAB — LIPASE, BLOOD: Lipase: 248 U/L — ABNORMAL HIGH (ref 11–51)

## 2023-09-15 MED ORDER — INSULIN ASPART 100 UNIT/ML IJ SOLN
0.0000 [IU] | Freq: Three times a day (TID) | INTRAMUSCULAR | Status: DC
  Administered 2023-09-17: 2 [IU] via SUBCUTANEOUS
  Filled 2023-09-15 (×2): qty 1

## 2023-09-15 MED ORDER — POTASSIUM CHLORIDE 10 MEQ/100ML IV SOLN
10.0000 meq | INTRAVENOUS | Status: AC
  Administered 2023-09-15 – 2023-09-16 (×3): 10 meq via INTRAVENOUS
  Filled 2023-09-15 (×3): qty 100

## 2023-09-15 MED ORDER — INSULIN ASPART 100 UNIT/ML IJ SOLN: 0.0000 [IU] | Freq: Every day | INTRAMUSCULAR | Status: DC

## 2023-09-15 MED ORDER — METHADONE HCL 10 MG PO TABS: 10.0000 mg | ORAL_TABLET | Freq: Four times a day (QID) | ORAL | Status: DC

## 2023-09-15 MED ORDER — LOSARTAN POTASSIUM 50 MG PO TABS
50.0000 mg | ORAL_TABLET | Freq: Every day | ORAL | Status: DC
  Administered 2023-09-16 – 2023-09-22 (×7): 50 mg via ORAL
  Filled 2023-09-15 (×7): qty 1

## 2023-09-15 MED ORDER — LACTATED RINGERS IV SOLN
INTRAVENOUS | Status: AC
Start: 2023-09-15 — End: 2023-09-16

## 2023-09-15 MED ORDER — AMLODIPINE BESYLATE 10 MG PO TABS
10.0000 mg | ORAL_TABLET | Freq: Every day | ORAL | Status: DC
  Administered 2023-09-15 – 2023-09-22 (×8): 10 mg via ORAL
  Filled 2023-09-15: qty 2
  Filled 2023-09-15: qty 1
  Filled 2023-09-15: qty 2
  Filled 2023-09-15 (×5): qty 1

## 2023-09-15 MED ORDER — HYDROMORPHONE HCL 1 MG/ML IJ SOLN
1.0000 mg | Freq: Once | INTRAMUSCULAR | Status: AC
  Administered 2023-09-15: 1 mg via INTRAVENOUS
  Filled 2023-09-15: qty 1

## 2023-09-15 MED ORDER — ATORVASTATIN CALCIUM 20 MG PO TABS
40.0000 mg | ORAL_TABLET | Freq: Every day | ORAL | Status: DC
  Administered 2023-09-15 – 2023-09-22 (×8): 40 mg via ORAL
  Filled 2023-09-15 (×8): qty 2

## 2023-09-15 MED ORDER — SODIUM CHLORIDE 0.9 % IV BOLUS
1000.0000 mL | Freq: Once | INTRAVENOUS | Status: AC
  Administered 2023-09-15: 1000 mL via INTRAVENOUS

## 2023-09-15 MED ORDER — TRAZODONE HCL 100 MG PO TABS
100.0000 mg | ORAL_TABLET | Freq: Every day | ORAL | Status: DC
  Administered 2023-09-15 – 2023-09-21 (×7): 100 mg via ORAL
  Filled 2023-09-15 (×7): qty 1

## 2023-09-15 MED ORDER — AMITRIPTYLINE HCL 10 MG PO TABS
10.0000 mg | ORAL_TABLET | Freq: Three times a day (TID) | ORAL | Status: DC
  Administered 2023-09-15 – 2023-09-21 (×9): 10 mg via ORAL
  Filled 2023-09-15 (×23): qty 1

## 2023-09-15 MED ORDER — PANCRELIPASE (LIP-PROT-AMYL) 36000-114000 UNITS PO CPEP
36000.0000 [IU] | ORAL_CAPSULE | Freq: Three times a day (TID) | ORAL | Status: DC
  Administered 2023-09-16 – 2023-09-22 (×17): 36000 [IU] via ORAL
  Filled 2023-09-15 (×19): qty 1

## 2023-09-15 MED ORDER — ONDANSETRON HCL 4 MG/2ML IJ SOLN
4.0000 mg | Freq: Four times a day (QID) | INTRAMUSCULAR | Status: DC | PRN
  Administered 2023-09-16 – 2023-09-22 (×17): 4 mg via INTRAVENOUS
  Filled 2023-09-15 (×20): qty 2

## 2023-09-15 MED ORDER — HYDROMORPHONE HCL 1 MG/ML IJ SOLN
1.0000 mg | INTRAMUSCULAR | Status: DC | PRN
  Administered 2023-09-15: 1 mg via INTRAVENOUS
  Filled 2023-09-15: qty 1

## 2023-09-15 MED ORDER — PANTOPRAZOLE SODIUM 40 MG PO TBEC
40.0000 mg | DELAYED_RELEASE_TABLET | Freq: Every day | ORAL | Status: DC
  Administered 2023-09-15 – 2023-09-22 (×8): 40 mg via ORAL
  Filled 2023-09-15 (×8): qty 1

## 2023-09-15 MED ORDER — BUPRENORPHINE HCL-NALOXONE HCL 8-2 MG SL SUBL
2.0000 | SUBLINGUAL_TABLET | Freq: Every day | SUBLINGUAL | Status: DC
  Filled 2023-09-15 (×2): qty 2

## 2023-09-15 MED ORDER — HYDROMORPHONE HCL 1 MG/ML IJ SOLN
0.5000 mg | Freq: Once | INTRAMUSCULAR | Status: AC
  Administered 2023-09-16: 0.5 mg via INTRAVENOUS
  Filled 2023-09-15: qty 0.5

## 2023-09-15 MED ORDER — ONDANSETRON HCL 4 MG/2ML IJ SOLN
4.0000 mg | Freq: Once | INTRAMUSCULAR | Status: AC
  Administered 2023-09-15: 4 mg via INTRAVENOUS
  Filled 2023-09-15: qty 2

## 2023-09-15 MED ORDER — ONDANSETRON HCL 4 MG PO TABS: 4.0000 mg | ORAL_TABLET | Freq: Every day | ORAL | Status: DC | PRN

## 2023-09-15 NOTE — ED Notes (Signed)
 Patient up to nurses station c/o dizziness and sitting in a wheelchair makes her dizzy. Patient refused wheelchair offered by this RN. Patient refused temperature check in triage

## 2023-09-15 NOTE — ED Triage Notes (Signed)
 Pt complains of abd pain since 2/21 after second ozempic injection. Pt has chronic pancreatitis and started ozempic 2/14. Also has nausea,vomiting and diarrhea.

## 2023-09-15 NOTE — Assessment & Plan Note (Signed)
 Right adnexal cyst Patient's pain is primarily in the left upper quadrant do not believe is contributing to this episode needs outpatient GYN follow-up upon discharge

## 2023-09-15 NOTE — Assessment & Plan Note (Signed)
   Hypertension Systolic blood pressure 170, likely primarily driven due to acute pain We will continue amlodipine 10 mg daily and losartan 50 mg daily We will address pain and reevaluate Denies any chest pain or headache or other signs of endorgan damage currently

## 2023-09-15 NOTE — Assessment & Plan Note (Signed)
   Acute pain secondary to pancreatitis In the setting of probable opiate use disorder Last outpatient note reports off of methadone and on Suboxone monotherapy 32 mg have continued as 16 mg inpatient and started breakthrough IV hydromorphone given acute pain in accordance with the SHOUT/SHAMSA guidelines of management of acute pain in someone with opiate use disorder

## 2023-09-15 NOTE — Assessment & Plan Note (Signed)
  Chronic diastolic heart failure Holding Aldactone at this time, appears euvolemic will follow fluid status with IV fluids for the patient's pancreatitis

## 2023-09-15 NOTE — Assessment & Plan Note (Signed)
 Chest pain We will obtain an ECG and cycle troponins although do not suspect a primary cardiac etiology

## 2023-09-15 NOTE — H&P (Signed)
 History and Physical    Patient: Sarah Sosa ZOX:[id number] DOB: 01-05-1981 DOA: 09/15/2023 DOS: the patient was seen and examined on 09/15/2023 PCP: Clinic, [facility]  Patient coming from: Home  Chief Complaint:  Chief Complaint  Patient presents with   Abdominal Pain   HPI:  43 year old female with a past medical history of chronic pancreatitis, aortic valve replacement, chronic diastolic heart failure, opiate use disorder probable who presents with nausea vomiting diarrhea and epigastric and left upper quadrant abdominal pain.  The patient reports the diarrhea has resolved however the nausea vomiting and abdominal pain has persisted and worsened, she received a Ozempic shot around that time prompting her presentation to the emergency department previous to today although enzymes were normal on September 11, 2023.  The patient had a telemedicine visit with [facility] gastro[facility]ology yesterday and subsequently went to the [facility] emergency department yesterday as well however left AGAINST MEDICAL ADVICE due to the wait time prompting her presentation today.  She denies any fever or chills hematemesis does report some vague chest pain although thinks this is from her pancreatitis. Denies -OH use for weeks.  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 although objective ethanol levels documented at previous visits, recommends outpatient MRCP, has been labeled with chronic pancreatitis despite absence of calcifications given the chronicity  Medical records reviewed and summarized: [facility] outpatient anticoagulation visit August 31, 2023: INR of 2.3 for goal of 1.5 to 2; Current prescribed warfarin dose: 7.5 mg Tues/Thurs and 3.75 mg all other days (TWD 33.75 mg) Current reported  warfarin dose:7.5 mg Tues/Thurs and 3.75 mg all other days (TWD 33.75 mg)   Past medical records reviewed and summarized: Patient's last outpatient visit at [facility] on August 31, 2023, at that point was removed of methadone and oxycodone and started on Suboxone 32 mg, likely opiate use disorder was documented in addition to chronic pain at that time started on Ozempic at that time, PDMP also reviewed: Filled  Written  ID  Drug  QTY  Days  Prescriber  RX #  Dispenser  Refill  Daily Dose*  Pymt Type  PMP  09/10/2023 09/10/2023 1 Suboxone 8 Mg-2 Mg Sl Film 100.00 25 [place] [id number] Ved (0127) 0/0 32.00 mg Medicaid [place] 08/27/2023 08/16/2023 1 Oxycodone Hcl (Ir) 5 Mg Tablet 115.00 30 [place] [id number] Ved (0127) 0/0 28.75 MME Medicaid [place] 08/17/2023 08/17/2023 1 Suboxone 8 Mg-2 Mg Sl Film 100.00 25 [place] [id number] Ved (0127) 0/0 32.00 mg Medicaid [place] 07/31/2023 07/09/2023 1 Methadone Hcl 10 Mg Tablet 120.00 30 [place] [id number] Ved (0127) 0/0 188.00 MME Medicaid [place] 07/23/2023 07/09/2023 1 Oxycodone Hcl (Ir) 5 Mg Tablet 115.00 29 [place] [id number] Ved (0127) 0/0 29.74 MME Medicaid [place] 07/19/2023 07/09/2023 1 Suboxone 8 Mg-2 Mg Sl Film 100.00 25 [place] [id number] Ved (0127) 0/0 32.00 mg Medicaid  07/02/2023 06/22/2023 1 Methadone Hcl 10 Mg Tablet 120.00 30 [place] [id number] Ved (0127) 0/0 188.00 MME Other   Review of Systems:  Constitutional: Denies Weight Loss, Fever, Chills or Night Sweats Eyes: Denies Blurry Vision, Eye Pain or Decreased Vision ENT: Denies Sore Throat , Tinnitus, Hearing Loss Respiratory: Denies Shortness of Breath, Cough, Hemoptysis, Wheezing, Pleurisy Cardiovascular:  Chest Pain Immediate Past not present  Denies Paroxsymal Nocturnal Dyspnea, Palpitations, Edema Gastrointestinal: Reports Nausea, Vomiting, Diarrhea but no Hematemesis, Melena All  other systems were reviewed and are negative  Past Medical History:  Diagnosis Date   Arthritis    Asthma    Chronic pain disorder 03/19/2018    GERD (gastroesophageal reflux disease)    Headache    Heart murmur    History of trichomoniasis 03/19/2018   Hypertension    Intractable nausea and vomiting    Pancreatitis    PID (pelvic inflammatory disease) 02/14/2018   Uterine leiomyoma 03/19/2018   Past Surgical History:  Procedure Laterality Date   ABDOMINAL HYSTERECTOMY     AORTIC VALVE REPLACEMENT  03/2022   CHOLECYSTECTOMY     DENTAL SURGERY     HYSTERECTOMY ABDOMINAL WITH SALPINGECTOMY Bilateral 04/15/2018   Procedure: HYSTERECTOMY ABDOMINAL WITH BILATERAL SALPINGECTOMY;  Surgeon: Hildred Laser, MD;  Location: [facility] ORS;  Service: Gynecology;  Laterality: Bilateral;   kenn surgery Bilateral    KNEE ARTHROSCOPY Left 2012, 2013   Social History:  reports that she has been smoking cigarettes. She has a 11 pack-year smoking history. She has never used smokeless tobacco. She reports that she does not currently use alcohol. She reports current drug use. Drug: Marijuana.  Allergies  Allergen Reactions   Latex Anaphylaxis, Hives and Itching    Other reaction(s): Other (See Comments) SKIN BURNING & PEELING    Nsaids Other (See Comments)    Serve ulcers; stomach aches, GERD.  Other Reaction(s): Other (See Comments)   Tomato Anaphylaxis, Hives and Itching   Gabapentin Palpitations    Has heart condition; also makes jittery   Metformin     Other Reaction(s): Abdominal Pain  Causes pancreatitis to worsen   Lisinopril     Other Reaction(s): Angioedema   Tramadol Itching and Hives   Duloxetine Anxiety    Makes her jittery    Family History  Problem Relation Age of Onset   Fibroids Mother    Hypertension Mother    Arthritis Mother     Prior to Admission medications   Medication Sig Start Date End Date Taking? Authorizing Provider  amitriptyline (ELAVIL) 10 MG tablet Take 10 mg by mouth 3 (three) times daily as needed. 11/02/22   [provider]  amLODipine (NORVASC) 10 MG tablet Take 1 tablet (10 mg total)  by mouth daily. 07/19/23   Marcelino Duster, MD  APPLE CIDER VINEGAR PO Take 1 capsule by mouth 3 (three) times daily.    [provider]  atorvastatin (LIPITOR) 40 MG tablet Take 40 mg by mouth daily. 10/17/22   [provider]  lipase/protease/amylase (CREON) 36000 UNITS CPEP capsule Take 1 capsule (36,000 Units total) by mouth 3 (three) times daily with meals. 08/13/23   Sreenath, Jonelle Sports, MD  losartan (COZAAR) 50 MG tablet Take 1 tablet (50 mg total) by mouth daily. 07/19/23   Marcelino Duster, MD  metFORMIN (GLUCOPHAGE) 500 MG tablet Take 500 mg by mouth every morning. 10/17/22   [provider]  methadone (DOLOPHINE) 10 MG tablet Take 10 mg by mouth 4 (four) times daily. 03/14/23   [provider]  naloxone [facility]) nasal spray 4 mg/0.1 mL Place 1 spray into the nose once as needed (to reverse overdose). 02/24/23   [provider]  omega-3 acid ethyl esters (LOVAZA) 1 g capsule Take 1 g by mouth 2 (two) times daily.    [provider]  omeprazole (PRILOSEC) 40 MG capsule Take 40 mg by mouth daily. 02/23/23   [provider]  ondansetron (ZOFRAN) 4 MG tablet Take 1 tablet (4 mg total) by  mouth daily as needed for nausea or vomiting. 08/13/23 08/12/24  Lolita Patella B, MD  spironolactone (ALDACTONE) 25 MG tablet Take 25 mg by mouth daily. 02/03/22   [provider]  traZODone (DESYREL) 50 MG tablet Take 100 mg by mouth at bedtime. 09/07/21   [provider]  warfarin (COUMADIN) 7.5 MG tablet Take 3.25-7.5 mg by mouth daily. Take 3.25mg   M/W/F/S/Sun and T/Th 7.5mg  (1 tab)    [provider]    Physical Exam: Vitals:   09/15/23 1550 09/15/23 1553 09/15/23 2000 09/15/23 2100  BP:  (!) 164/119 (!) 176/162 (!) 126/100  Pulse:  86 83 81  Resp:  16 20 19   Temp:   98.5 F (36.9 C) 99.2 F (37.3 C)  TempSrc:   Oral Oral  SpO2:  100% 100% 97%  Weight: 120.7 kg     Height: 5\' 7"  (1.702 m)     Patient seen  circa 8:30 PM Room 18 of ED with IV RN [place] Constitutional:  Vital Signs as per [facility] than three noted] No Acute Distress Neck:     Trachea Midline, Neck Symmetric  Sternal scar noted             Thyroid without tenderness, palpable masses or nodules Respiratory:   Respiratory Effort Normal: No Use of Respiratory Muscles,No  Intercostal Retractions             Lungs Clear to Auscultation Bilaterally Cardiovascular:   Heart Auscultated: Regular Regular without any added sounds or murmurs              No Lower Extremity Edema Gastrointestinal:  Abdomen soft Epigastric and left upper quadrant pain on palpation without any rebound or guarding No Palpable Splenomegaly or Hepatomegaly Psychiatric:  Patient Orientated to Time, Place and Person Patient with appropriate mood and affect Recent and Remote Memory Intact  Data Reviewed: Labs, Radiology, ECG as detailed in HPI and A/P   Assessment and Plan: * Pancreatitis Acute on chronic pancreatitis Supported by the [facility] criteria given lipase is 248 and the patient has typical abdominal pain, no indication for reimaging at this time, previous abdominal imaging September 11, 2023 Likely culprit is recent Ozempic initiation, we will stop Has been labeled with chronic pancreatitis despite calcifications due to the chronicity as per latest GI outpatient note, also recommend outpatient MRCP will not obtain inpatient given lower suspicion that is contributing to today's episode, as per outpatient note previous episodes likely alcohol related, patient denies any alcohol use at this time, continue Creon, Zofran, clear liquid diet and advance as tolerated started lactated Ringer's 100 mL/h does not appear hemoconcentrated or significantly third spacing at this time, given the patient's grade 2 diastolic dysfunction will evaluate for short interval fluid discontinuation the pancreatitis does not advance  Secondary hypercoagulability disorder  (HCC)  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) INR ordered and pharmacy has been consulted for warfarin dosing Medical records reviewed and summarized: [facility] outpatient anticoagulation visit August 31, 2023: INR of 2.3 for goal of 1.5 to 2; Current prescribed warfarin dose: 7.5 mg Tues/Thurs and 3.75 mg all other days (TWD 33.75 mg) Current reported warfarin dose:7.5 mg Tues/Thurs and 3.75 mg all other days (TWD 33.75 mg)   Hypokalemia  Hypokalemia Potassium 3.3, we will replace IV given nausea and obtain magnesium levels  Essential hypertension   Hypertension Systolic blood pressure 170, likely primarily driven due to acute pain We will continue amlodipine 10 mg daily and losartan  50 mg daily We will address pain and reevaluate Denies any chest pain or headache or other signs of endorgan damage currently  Acute pain   Acute pain secondary to pancreatitis In the setting of probable opiate use disorder Last outpatient note reports off of methadone and on Suboxone monotherapy 32 mg have continued as 16 mg inpatient and started breakthrough IV hydromorphone given acute pain in accordance with the SHOUT/SHAMSA guidelines of management of acute pain in someone with opiate use disorder   DM2 (diabetes mellitus, type 2) (HCC) Type 2 diabetes mellitus Holding metformin given association with exacerbating chronic pancreatitis especially in renal failure although the patient does not have failure we will hold to be conservative and give sliding scale insulin    Chronic diastolic CHF (congestive heart failure) (HCC)  Chronic diastolic heart failure Holding Aldactone at this time, appears euvolemic will follow fluid status with IV fluids for the patient's pancreatitis  Chest pain Chest pain We will obtain an ECG and cycle troponins although do not suspect a primary cardiac etiology  Adnexal cyst Right adnexal cyst Patient's pain is  primarily in the left upper quadrant do not believe is contributing to this episode needs outpatient GYN follow-up upon discharge      Advance Care Planning: Full Code  Consults: None  Family Communication: Nil as per patient   Severity of Illness: The appropriate patient status for this patient is INPATIENT. Inpatient status is judged to be reasonable and necessary in order to provide the required intensity of service to ensure the patient's safety. The patient's presenting symptoms, physical exam findings, and initial radiographic and laboratory data in the context of their chronic comorbidities is felt to place them at high risk for further clinical deterioration. Furthermore, it is not anticipated that the patient will be medically stable for discharge from the hospital within 2 midnights of admission.   * I certify that at the point of admission it is my clinical judgment that the patient will require inpatient hospital care spanning beyond 2 midnights from the point of admission due to high intensity of service, high risk for further deterioration and high frequency of surveillance required.*   Author: Princess Bruins, MD 09/15/2023 9:23 PM  For on call review [email or web address].

## 2023-09-15 NOTE — ED Notes (Signed)
 Patient placed in recliner in subwait

## 2023-09-15 NOTE — ED Provider Notes (Signed)
 [facility] Provider Note    Event Date/Time   First MD Initiated Contact with Patient 09/15/23 1954     (approximate)  History   Chief Complaint: Abdominal Pain  HPI  Sarah Sosa is a 43 y.o. female with a past medical history of asthma, arthritis, hypertension, chronic pancreatitis, presents to the emergency department for worsening upper abdominal pain.  According to the patient for the past 4 or 5 days now she has been experiencing worsening epigastric/upper abdominal pain now with radiation to the back.  Patient states it feels identical to past episodes of pancreatitis.  Patient states for the pain started on Wednesday she came to the emergency department, per record review had a normal lipase as well as a reassuring CT scan.  Patient discharged home.  Patient states the pain is continued to worsen along with nausea vomiting, patient came back to the emergency department for evaluation today.  Physical Exam   Triage Vital Signs: ED Triage Vitals  Encounter Vitals Group     BP 09/15/23 1553 (!) 164/119     Systolic BP Percentile --      Diastolic BP Percentile --      Pulse Rate 09/15/23 1553 86     Resp 09/15/23 1553 16     Temp 09/15/23 2000 98.5 F (36.9 C)     Temp Source 09/15/23 2000 Oral     SpO2 09/15/23 1553 100 %     Weight 09/15/23 1550 266 lb (120.7 kg)     Height 09/15/23 1550 5\' 7"  (1.702 m)     Head Circumference --      Peak Flow --      Pain Score 09/15/23 1550 10     Pain Loc --      Pain Education --      Exclude from Growth Chart --     Most recent vital signs: Vitals:   09/15/23 1553 09/15/23 2000  BP: (!) 164/119 (!) 176/162  Pulse: 86 83  Resp: 16 20  Temp:  98.5 F (36.9 C)  SpO2: 100% 100%    General: Awake, no distress.  CV:  Good peripheral perfusion.  Regular rate and rhythm  Resp:  Normal effort.  Equal breath sounds bilaterally.  Abd:  No distention.  Soft, significant tenderness to palpation in the  epigastrium and left upper quadrant.   ED Results / Procedures / Treatments   MEDICATIONS ORDERED IN ED: Medications  HYDROmorphone (DILAUDID) injection 1 mg (has no administration in time range)  ondansetron (ZOFRAN) injection 4 mg (has no administration in time range)  sodium chloride 0.9 % bolus 1,000 mL (has no administration in time range)     IMPRESSION / MDM / ASSESSMENT AND PLAN / ED COURSE  I reviewed the triage vital signs and the nursing notes.  Patient's presentation is most consistent with acute presentation with potential threat to life or bodily function.  Patient presents the emergency department for for 5 days now of upper abdominal pain along with nausea vomiting.  History of pancreatitis in the past symptoms feel identical per patient.  Has significant epigastric tenderness.  Patient's lab work today shows a reassuring CBC, reassuring chemistry.  Patient's lipase however is now elevated to 248 and was normal 4 days ago.  Given the elevated lipase highly suspect acute pancreatitis.  Recent CT scan showed no acute findings from several days ago.  Do not need to repeat a CT today.  Patient is status post cholecystectomy.  States avoids all alcohol products.  Patient did recently start Ozempic 2 weeks ago which she believes has exacerbated her symptoms.  We will treat pain, nausea, IV hydrate and discussed with the hospitalist for admission.  FINAL CLINICAL IMPRESSION(S) / ED DIAGNOSES   Pancreatitis  Note:  This document was prepared using Dragon voice recognition software and may include unintentional dictation errors.   Minna Antis, MD 09/15/23 2008

## 2023-09-15 NOTE — ED Notes (Signed)
IV team in room at this time.  

## 2023-09-15 NOTE — Assessment & Plan Note (Signed)
  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

## 2023-09-15 NOTE — Assessment & Plan Note (Signed)
 Type 2 diabetes mellitus Holding metformin given association with exacerbating chronic pancreatitis especially in renal failure although the patient does not have failure we will hold to be conservative and give sliding scale insulin

## 2023-09-15 NOTE — ED Triage Notes (Signed)
Pt has not taken BP medication today.

## 2023-09-15 NOTE — Assessment & Plan Note (Signed)
 Acute on chronic pancreatitis Supported by the [facility] criteria given lipase is 248 and the patient has typical abdominal pain, no indication for reimaging at this time, previous abdominal imaging September 11, 2023 Likely culprit is recent Ozempic initiation, we will stop Has been labeled with chronic pancreatitis despite calcifications due to the chronicity as per latest GI outpatient note, also recommend outpatient MRCP will not obtain inpatient given lower suspicion that is contributing to today's episode, as per outpatient note previous episodes likely alcohol related, patient denies any alcohol use at this time, continue Creon, Zofran, clear liquid diet and advance as tolerated started lactated Ringer's 100 mL/h does not appear hemoconcentrated or significantly third spacing at this time, given the patient's grade 2 diastolic dysfunction will evaluate for short interval fluid discontinuation the pancreatitis does not advance

## 2023-09-15 NOTE — Assessment & Plan Note (Signed)
  Hypokalemia Potassium 3.3, we will replace IV given nausea and obtain magnesium levels

## 2023-09-16 DIAGNOSIS — K853 Drug induced acute pancreatitis without necrosis or infection: Secondary | ICD-10-CM

## 2023-09-16 LAB — URINALYSIS, ROUTINE W REFLEX MICROSCOPIC
Bilirubin Urine: NEGATIVE
Glucose, UA: NEGATIVE mg/dL
Ketones, ur: NEGATIVE mg/dL
Leukocytes,Ua: NEGATIVE
Nitrite: NEGATIVE
Protein, ur: NEGATIVE mg/dL
Specific Gravity, Urine: 1.004 — ABNORMAL LOW (ref 1.005–1.030)
pH: 6 (ref 5.0–8.0)

## 2023-09-16 LAB — COMPREHENSIVE METABOLIC PANEL
ALT: 13 U/L (ref 0–44)
AST: 23 U/L (ref 15–41)
Albumin: 3.9 g/dL (ref 3.5–5.0)
Alkaline Phosphatase: 72 U/L (ref 38–126)
Anion gap: 11 (ref 5–15)
BUN: <5 mg/dL — ABNORMAL LOW (ref 6–20)
CO2: 20 mmol/L — ABNORMAL LOW (ref 22–32)
Calcium: 8.7 mg/dL — ABNORMAL LOW (ref 8.9–10.3)
Chloride: 102 mmol/L (ref 98–111)
Creatinine, Ser: 0.63 mg/dL (ref 0.44–1.00)
GFR, Estimated: >60 mL/min (ref >60)
Glucose, Bld: 97 mg/dL (ref 70–99)
Potassium: 4.9 mmol/L (ref 3.5–5.1)
Sodium: 133 mmol/L — ABNORMAL LOW (ref 135–145)
Total Bilirubin: 1 mg/dL (ref 0.0–1.2)
Total Protein: 7.6 g/dL (ref 6.5–8.1)

## 2023-09-16 LAB — CBC
HCT: 41.8 % (ref 36.0–46.0)
Hemoglobin: 13.7 g/dL (ref 12.0–15.0)
MCH: 30.1 pg (ref 26.0–34.0)
MCHC: 32.8 g/dL (ref 30.0–36.0)
MCV: 91.9 fL (ref 80.0–100.0)
Platelets: 233 10*3/uL (ref 150–400)
RBC: 4.55 MIL/uL (ref 3.87–5.11)
RDW: 13.1 % (ref 11.5–15.5)
WBC: 6.5 10*3/uL (ref 4.0–10.5)
nRBC: 0 % (ref 0.0–0.2)

## 2023-09-16 LAB — HEMOGLOBIN A1C
Hgb A1c MFr Bld: 6.4 % — ABNORMAL HIGH (ref 4.8–5.6)
Mean Plasma Glucose: 136.98 mg/dL

## 2023-09-16 LAB — PROTIME-INR
INR: 3.6 — ABNORMAL HIGH (ref 0.8–1.2)
Prothrombin Time: 36 s — ABNORMAL HIGH (ref 11.4–15.2)

## 2023-09-16 LAB — MAGNESIUM: Magnesium: 1.6 mg/dL — ABNORMAL LOW (ref 1.7–2.4)

## 2023-09-16 LAB — TROPONIN I (HIGH SENSITIVITY): Troponin I (High Sensitivity): 21 ng/L — ABNORMAL HIGH (ref <18)

## 2023-09-16 LAB — CBG MONITORING, ED: Glucose-Capillary: 110 mg/dL — ABNORMAL HIGH (ref 70–99)

## 2023-09-16 MED ORDER — METHADONE HCL 10 MG PO TABS
10.0000 mg | ORAL_TABLET | Freq: Four times a day (QID) | ORAL | Status: DC
  Filled 2023-09-16: qty 1

## 2023-09-16 MED ORDER — HYDROMORPHONE HCL 1 MG/ML IJ SOLN
1.0000 mg | INTRAMUSCULAR | Status: DC | PRN
  Administered 2023-09-16 – 2023-09-20 (×26): 2 mg via INTRAVENOUS
  Administered 2023-09-20: 1 mg via INTRAVENOUS
  Filled 2023-09-16 (×13): qty 2
  Filled 2023-09-16: qty 1
  Filled 2023-09-16 (×3): qty 2
  Filled 2023-09-16: qty 1
  Filled 2023-09-16 (×3): qty 2
  Filled 2023-09-16: qty 1
  Filled 2023-09-16 (×9): qty 2

## 2023-09-16 MED ORDER — HYDROMORPHONE HCL 1 MG/ML IJ SOLN
1.0000 mg | INTRAMUSCULAR | Status: DC | PRN
  Administered 2023-09-16 (×8): 1 mg via INTRAVENOUS
  Filled 2023-09-16 (×8): qty 1

## 2023-09-16 MED ORDER — WARFARIN - PHARMACIST DOSING INPATIENT
Freq: Every day | Status: DC
  Filled 2023-09-16: qty 1

## 2023-09-16 NOTE — Progress Notes (Signed)
 PROGRESS NOTE    Sarah Sosa  WGN:[id number] DOB: 07/15/1981 DOA: 09/15/2023 PCP: Clinic, [facility]     Brief Narrative:   From admission h and p  43 year old female with a past medical history of chronic pancreatitis, aortic valve replacement, chronic diastolic heart failure, opiate use disorder probable who presents with nausea vomiting diarrhea and epigastric and left upper quadrant abdominal pain.  The patient reports the diarrhea has resolved however the nausea vomiting and abdominal pain has persisted and worsened, she received a Ozempic shot around that time prompting her presentation to the emergency department previous to today although enzymes were normal on September 11, 2023.  The patient had a telemedicine visit with [facility] gastro[facility]ology yesterday and subsequently went to the [facility] emergency department yesterday as well however left AGAINST MEDICAL ADVICE due to the wait time prompting her presentation today.  She denies any fever or chills hematemesis does report some vague chest pain although thinks this is from her pancreatitis. Denies -OH use for weeks.   Last echocardiogram 07/16/2023 EF 70% with grade 2 diastolic dysfunction   Past medical records reviewed and summarized: [facility] telemedicine visit with gastroenterology yesterday, patient has alcohol induced pancreatitis reports abstinence although objective ethanol levels documented at previous visits, recommends outpatient MRCP, has been labeled with chronic pancreatitis despite absence of calcifications given the chronicity    Assessment & Plan:   Principal Problem:   Pancreatitis Active Problems:   Alcohol abuse   Secondary hypercoagulability disorder (HCC)   Hypokalemia   Essential hypertension   S/p aortic valve replacement with 'On-X" valve (INR target of 1.5 to 2) and ascending aortic graft ([facility], 03/2022)   Obesity, Class III, BMI 40-49.9 (morbid obesity) (HCC)   Chronic use of opiate  drug for therapeutic purpose   Adnexal cyst   Chest pain   Chronic diastolic CHF (congestive heart failure) (HCC)   DM2 (diabetes mellitus, type 2) (HCC)   Acute pain  # Acute on chronic pancreatitis 2/2 prior alcohol use, recent start of glp-1 may have contributed. Had benign imaging one week ago, hemodynamically stable. Is s/p cholecystectomy - continue fluids - continue pain control - f/u lipids - would repeat imaging tomorrow if no improvement - clear diet today, advance as tolerated - home creon  # History aortic valve replacement Inr goal per records is 1.5-2. 3.6 today - warfarin per pharmacy.  # Chronic pain - cont home methadone (qtc 427) - holding home suboxone while treating acute pain  # Obesity noted  # HTN Bp moderately elevated - cont home amlod, losart  # T2DM - SSI   DVT prophylaxis: coumadin Code Status: full Family Communication: none at bedside  Level of care: Med-Surg Status is: Inpatient Remains inpatient appropriate because: severity of illness    Consultants:  none  Procedures: none  Antimicrobials:  none    Subjective: Ongoing epigastric abd pain, no diarrhea or vomiting.  Objective: Vitals:   09/15/23 2100 09/15/23 2236 09/15/23 2340 09/16/23 0608  BP: (!) 126/100 (!) 167/103  (!) 154/100  Pulse: 81 80  94  Resp: 19 18 19 18   Temp: 99.2 F (37.3 C) 98.6 F (37 C)  98.5 F (36.9 C)  TempSrc: Oral Oral  Oral  SpO2: 97% 98%  93%  Weight:      Height:       No intake or output data in the 24 hours ending 09/16/23 0918 Filed Weights   09/15/23 1550  Weight: 120.7 kg  Examination:  General exam: Appears calm and comfortable  Respiratory system: Clear to auscultation. Respiratory effort normal. Cardiovascular system: S1 & S2 heard, RRR. Systolic murmur Gastrointestinal system: Abdomen is nondistended, soft, ttp epigastrum Central nervous system: Alert and oriented. No focal neurological deficits. Extremities:  Symmetric 5 x 5 power. Skin: No rashes, lesions or ulcers Psychiatry: Judgement and insight appear normal. Mood & affect appropriate.     Data Reviewed: I have personally reviewed following labs and imaging studies  CBC: Recent Labs  Lab 09/11/23 1608 09/15/23 1630 09/16/23 0126  WBC 7.2 6.4 6.5  NEUTROABS 4.6  --   --   HGB 13.8 14.5 13.7  HCT 41.3 43.1 41.8  MCV 89.4 90.4 91.9  PLT 286 246 233   Basic Metabolic Panel: Recent Labs  Lab 09/11/23 1608 09/15/23 0126 09/15/23 1630 09/16/23 0126  NA 140  --  137 133*  K 3.1*  --  3.3* 4.9  CL 107  --  103 102  CO2 22  --  24 20*  GLUCOSE 144*  --  97 97  BUN 9  --  5* <5*  CREATININE 0.87  --  0.72 0.63  CALCIUM 9.0  --  9.0 8.7*  MG  --  1.6*  --   --    GFR: Estimated Creatinine Clearance: 123.2 mL/min (by C-G formula based on SCr of 0.63 mg/dL). Liver Function Tests: Recent Labs  Lab 09/11/23 1608 09/15/23 1630 09/16/23 0126  AST 25 23 23   ALT 16 16 13   ALKPHOS 72 76 72  BILITOT 0.6 0.7 1.0  PROT 7.4 7.7 7.6  ALBUMIN 4.0 4.2 3.9   Recent Labs  Lab 09/11/23 1608 09/15/23 1630  LIPASE 20 248*   No results for input(s): "AMMONIA" in the last 168 hours. Coagulation Profile: Recent Labs  Lab 09/16/23 0126  INR 3.6*   Cardiac Enzymes: No results for input(s): "CKTOTAL", "CKMB", "CKMBINDEX", "TROPONINI" in the last 168 hours. BNP (last 3 results) No results for input(s): "PROBNP" in the last 8760 hours. HbA1C: Recent Labs    09/16/23 0126  HGBA1C 6.4*   CBG: Recent Labs  Lab 09/15/23 2221 09/16/23 0751  GLUCAP 91 110*   Lipid Profile: No results for input(s): "CHOL", "HDL", "LDLCALC", "TRIG", "CHOLHDL", "LDLDIRECT" in the last 72 hours. Thyroid Function Tests: No results for input(s): "TSH", "T4TOTAL", "FREET4", "T3FREE", "THYROIDAB" in the last 72 hours. Anemia Panel: No results for input(s): "VITAMINB12", "FOLATE", "FERRITIN", "TIBC", "IRON", "RETICCTPCT" in the last 72 hours. Urine  analysis:    Component Value Date/Time   COLORURINE YELLOW (A) 09/11/2023 1608   APPEARANCEUR HAZY (A) 09/11/2023 1608   LABSPEC >1.046 (H) 09/11/2023 1608   PHURINE 6.0 09/11/2023 1608   GLUCOSEU NEGATIVE 09/11/2023 1608   HGBUR NEGATIVE 09/11/2023 1608   BILIRUBINUR NEGATIVE 09/11/2023 1608   KETONESUR NEGATIVE 09/11/2023 1608   PROTEINUR 30 (A) 09/11/2023 1608   NITRITE NEGATIVE 09/11/2023 1608   LEUKOCYTESUR NEGATIVE 09/11/2023 1608   Sepsis Labs: @LABRCNTIP (procalcitonin:4,lacticidven:4)  )No results found for this or any previous visit (from the past 240 hours).       Radiology Studies: No results found.      Scheduled Meds:  amitriptyline  10 mg Oral TID   amLODipine  10 mg Oral Daily   atorvastatin  40 mg Oral Daily   buprenorphine-naloxone  2 tablet Sublingual Daily   insulin aspart  0-15 Units Subcutaneous TID WC   insulin aspart  0-5 Units Subcutaneous QHS   lipase/protease/amylase  36,000  Units Oral TID WC   losartan  50 mg Oral Daily   pantoprazole  40 mg Oral Daily   traZODone  100 mg Oral QHS   Continuous Infusions:  lactated ringers 100 mL/hr at 09/16/23 0535     LOS: 1 day     Silvano Bilis, MD [facility]   If 7PM-7AM, please contact night-coverage [email or web address] Password [email or web address] 09/16/2023, 9:18 AM

## 2023-09-16 NOTE — ED Notes (Signed)
 Pt refused CBG. Pt states "I am not diabetic." MD notified.

## 2023-09-16 NOTE — ED Notes (Signed)
 Report received from off going Nurse. Pt A&O x4. Resting quietly in bed. Rise and fall of chest present. No distress present. Bed low and locked, call light in reach.

## 2023-09-16 NOTE — Progress Notes (Addendum)
 PHARMACY - ANTICOAGULATION CONSULT NOTE  Pharmacy Consult for Warfarin  Indication:  On-X mechanical valve  -aortic  Allergies  Allergen Reactions   Latex Anaphylaxis, Hives and Itching    Other reaction(s): Other (See Comments) SKIN BURNING & PEELING    Nsaids Other (See Comments)    Serve ulcers; stomach aches, GERD.  Other Reaction(s): Other (See Comments)   Tomato Anaphylaxis, Hives and Itching   Gabapentin Palpitations    Has heart condition; also makes jittery   Metformin     Other Reaction(s): Abdominal Pain  Causes pancreatitis to worsen   Lisinopril     Other Reaction(s): Angioedema   Tramadol Itching and Hives   Duloxetine Anxiety    Makes her jittery    Patient Measurements: Height: 5\' 7"  (170.2 cm) Weight: 120.7 kg (266 lb) IBW/kg (Calculated) : 61.6 Heparin Dosing Weight:   Vital Signs: Temp: 98.6 F (37 C) (03/01 2236) Temp Source: Oral (03/01 2236) BP: 167/103 (03/01 2236) Pulse Rate: 80 (03/01 2236)  Labs: Recent Labs    09/15/23 1630 09/16/23 0126  HGB 14.5 13.7  HCT 43.1 41.8  PLT 246 233  LABPROT  --  36.0*  INR  --  3.6*  CREATININE 0.72 0.63    Estimated Creatinine Clearance: 123.2 mL/min (by C-G formula based on SCr of 0.63 mg/dL).   Medical History: Past Medical History:  Diagnosis Date   Arthritis    Asthma    Chronic pain disorder 03/19/2018   GERD (gastroesophageal reflux disease)    Headache    Heart murmur    History of trichomoniasis 03/19/2018   Hypertension    Intractable nausea and vomiting    Pancreatitis    PID (pelvic inflammatory disease) 02/14/2018   Uterine leiomyoma 03/19/2018    Medications:    Assessment: Pharmacy consulted to dose warfarin in this 43 year old female with On-X mechanical heart valve.  Per note from [facility] cardiology,  goal INR is 1.5 - 2.0.    -per [facility] anticoagulation visit note 09/14/23:  INR was 3.3   (goal 1.5-2.0)Start warfarin 7.5 mg ONLY on Thursdays and then 3.75 mg all  other days (TWD 30 mg)   Unsure of last dose.   3/1:  INR @ 0126 = 3.6, elevated    HOLD warfarin   Goal of Therapy:  INR :   1.5 - 2.0   Plan:  Supratherapeutic INR -Hold Warfarin dosing - will check INR on 3/3 with AM labs -CBC per protocol  Bari Mantis PharmD Clinical Pharmacist 09/16/2023

## 2023-09-16 NOTE — ED Notes (Signed)
 Pt requesting higher pain medicine dose. MD notified.

## 2023-09-17 ENCOUNTER — Inpatient Hospital Stay

## 2023-09-17 DIAGNOSIS — K853 Drug induced acute pancreatitis without necrosis or infection: Secondary | ICD-10-CM | POA: Diagnosis not present

## 2023-09-17 LAB — COMPREHENSIVE METABOLIC PANEL
ALT: 12 U/L (ref 0–44)
AST: 19 U/L (ref 15–41)
Albumin: 3.8 g/dL (ref 3.5–5.0)
Alkaline Phosphatase: 68 U/L (ref 38–126)
Anion gap: 10 (ref 5–15)
BUN: 5 mg/dL — ABNORMAL LOW (ref 6–20)
CO2: 26 mmol/L (ref 22–32)
Calcium: 9 mg/dL (ref 8.9–10.3)
Chloride: 102 mmol/L (ref 98–111)
Creatinine, Ser: 0.83 mg/dL (ref 0.44–1.00)
GFR, Estimated: >60 mL/min (ref >60)
Glucose, Bld: 124 mg/dL — ABNORMAL HIGH (ref 70–99)
Potassium: 3.5 mmol/L (ref 3.5–5.1)
Sodium: 138 mmol/L (ref 135–145)
Total Bilirubin: 0.6 mg/dL (ref 0.0–1.2)
Total Protein: 7.1 g/dL (ref 6.5–8.1)

## 2023-09-17 LAB — LIPID PANEL
Cholesterol: 122 mg/dL (ref 0–200)
HDL: 65 mg/dL (ref >40)
LDL Cholesterol: 41 mg/dL (ref 0–99)
Total CHOL/HDL Ratio: 1.9 ratio
Triglycerides: 81 mg/dL (ref <150)
VLDL: 16 mg/dL (ref 0–40)

## 2023-09-17 LAB — CBC
HCT: 40.8 % (ref 36.0–46.0)
Hemoglobin: 13.2 g/dL (ref 12.0–15.0)
MCH: 30.5 pg (ref 26.0–34.0)
MCHC: 32.4 g/dL (ref 30.0–36.0)
MCV: 94.2 fL (ref 80.0–100.0)
Platelets: 257 10*3/uL (ref 150–400)
RBC: 4.33 MIL/uL (ref 3.87–5.11)
RDW: 13.2 % (ref 11.5–15.5)
WBC: 5.9 10*3/uL (ref 4.0–10.5)
nRBC: 0 % (ref 0.0–0.2)

## 2023-09-17 LAB — PROTIME-INR
INR: 3.6 — ABNORMAL HIGH (ref 0.8–1.2)
Prothrombin Time: 36.4 s — ABNORMAL HIGH (ref 11.4–15.2)

## 2023-09-17 LAB — GLUCOSE, CAPILLARY
Glucose-Capillary: 144 mg/dL — ABNORMAL HIGH (ref 70–99)
Glucose-Capillary: 111 mg/dL — ABNORMAL HIGH (ref 70–99)

## 2023-09-17 MED ORDER — IOHEXOL 300 MG/ML  SOLN
100.0000 mL | Freq: Once | INTRAMUSCULAR | Status: AC | PRN
  Administered 2023-09-17: 100 mL via INTRAVENOUS

## 2023-09-17 MED ORDER — LACTATED RINGERS IV SOLN: INTRAVENOUS | Status: AC

## 2023-09-17 NOTE — Progress Notes (Signed)
 PHARMACY - ANTICOAGULATION CONSULT NOTE  Pharmacy Consult for Warfarin  Indication:  On-X mechanical valve  -aortic  Allergies  Allergen Reactions   Latex Anaphylaxis, Hives and Itching    Other reaction(s): Other (See Comments) SKIN BURNING & PEELING    Nsaids Other (See Comments)    Serve ulcers; stomach aches, GERD.  Other Reaction(s): Other (See Comments)   Tomato Anaphylaxis, Hives and Itching   Gabapentin Palpitations    Has heart condition; also makes jittery   Metformin     Other Reaction(s): Abdominal Pain  Causes pancreatitis to worsen   Lisinopril     Other Reaction(s): Angioedema   Tramadol Itching and Hives   Duloxetine Anxiety    Makes her jittery    Patient Measurements: Height: 5\' 7"  (170.2 cm) Weight: 120.7 kg (266 lb) IBW/kg (Calculated) : 61.6 Heparin Dosing Weight:   Vital Signs: Temp: 97.9 F (36.6 C) (03/02 2051) Temp Source: Oral (03/02 2051) BP: 127/90 (03/02 2051) Pulse Rate: 89 (03/02 2051)  Labs: Recent Labs    09/15/23 1630 09/16/23 0126 09/17/23 0259  HGB 14.5 13.7 13.2  HCT 43.1 41.8 40.8  PLT 246 233 257  LABPROT  --  36.0* 36.4*  INR  --  3.6* 3.6*  CREATININE 0.72 0.63 0.83  TROPONINIHS  --  21*  --     Estimated Creatinine Clearance: 118.8 mL/min (by C-G formula based on SCr of 0.83 mg/dL).   Medical History: Past Medical History:  Diagnosis Date   Arthritis    Asthma    Chronic pain disorder 03/19/2018   GERD (gastroesophageal reflux disease)    Headache    Heart murmur    History of trichomoniasis 03/19/2018   Hypertension    Intractable nausea and vomiting    Pancreatitis    PID (pelvic inflammatory disease) 02/14/2018   Uterine leiomyoma 03/19/2018    Medications:    Assessment: Pharmacy consulted to dose warfarin in this 43 year old female with On-X mechanical heart valve.  Per note from [facility] cardiology,  goal INR is 1.5 - 2.0.    -per [facility] anticoagulation visit note 09/14/23:  INR was 3.3   (goal  1.5-2.0)Start warfarin 7.5 mg ONLY on Thursdays and then 3.75 mg all other days (TWD 30 mg)   Unsure of last dose.   3/2:  INR @ 0126 = 3.6, elevated    HOLD warfarin 3/3:  INR @ 0259 = 3.6, elevated    HOLD warfarin    Goal of Therapy:  INR :   1.5 - 2.0   Plan:  Supratherapeutic INR -Hold Warfarin dosing -Will check INR on 3/3 with AM labs -CBC per protocol  Paulita Fujita, PharmD Clinical Pharmacist 09/17/2023

## 2023-09-17 NOTE — Progress Notes (Signed)
 Patient arrived on unit from ED alert and oriented. Patient ambulated steadily from wheelchair to bed. Patient settled into bed, admission assessment completed. Attempted to educate patient regarding the orders for her to have blood glucose checks done. Patient refuses stating that she does not have Diabetes. Will continue to monitor as per orders and plan of care.

## 2023-09-17 NOTE — Plan of Care (Signed)
  Problem: Nutritional: Goal: Progress toward achieving an optimal weight will improve Outcome: Progressing   Problem: Skin Integrity: Goal: Risk for impaired skin integrity will decrease Outcome: Progressing   Problem: Tissue Perfusion: Goal: Adequacy of tissue perfusion will improve Outcome: Progressing   Problem: Clinical Measurements: Goal: Diagnostic test results will improve Outcome: Progressing Goal: Respiratory complications will improve Outcome: Progressing Goal: Cardiovascular complication will be avoided Outcome: Progressing

## 2023-09-17 NOTE — Plan of Care (Signed)
 Patient medicated for pain and nausea as needed/ordered with positive effect. Patient refuses to get blood glucose levels checked. Will continue to monitor.   Problem: Coping: Goal: Ability to adjust to condition or change in health will improve Outcome: Progressing   Problem: Clinical Measurements: Goal: Ability to maintain clinical measurements within normal limits will improve Outcome: Progressing   Problem: Activity: Goal: Risk for activity intolerance will decrease Outcome: Progressing   Problem: Coping: Goal: Level of anxiety will decrease Outcome: Progressing   Problem: Pain Managment: Goal: General experience of comfort will improve and/or be controlled Outcome: Progressing   Problem: Safety: Goal: Ability to remain free from injury will improve Outcome: Progressing

## 2023-09-17 NOTE — Progress Notes (Signed)
 PROGRESS NOTE    Sarah Sosa  ZOX:[id number] DOB: 1981-02-21 DOA: 09/15/2023 PCP: [facility]     Brief Narrative:   From admission h and p  43 year old female with a past medical history of chronic pancreatitis, aortic valve replacement, chronic diastolic heart failure, opiate use disorder probable who presents with nausea vomiting diarrhea and epigastric and left upper quadrant abdominal pain.  The patient reports the diarrhea has resolved however the nausea vomiting and abdominal pain has persisted and worsened, she received a Ozempic shot around that time prompting her presentation to the emergency department previous to today although enzymes were normal on September 11, 2023.  The patient had a telemedicine visit with [facility] yesterday and subsequently went to the [facility] emergency department yesterday as well however left AGAINST MEDICAL ADVICE due to the wait time prompting her presentation today.  She denies any fever or chills hematemesis does report some vague chest pain although thinks this is from her pancreatitis. Denies -OH use for weeks.   Last echocardiogram 07/16/2023 EF 70% with grade 2 diastolic dysfunction   Past medical records reviewed and summarized: [facility] tele[facility]icine visit with gastroenterology yesterday, patient has alcohol induced pancreatitis reports abstinence although objective ethanol levels documented at previous visits, recommends outpatient MRCP, has been labeled with chronic pancreatitis despite absence of calcifications given the chronicity    Assessment & Plan:   Principal Problem:   Pancreatitis Active Problems:   Alcohol abuse   Secondary hypercoagulability disorder (HCC)   Hypokalemia   Essential hypertension   S/p aortic valve replacement with 'On-X" valve (INR target of 1.5 to 2) and ascending aortic graft ([facility], 03/2022)   Obesity, Class III, BMI 40-49.9 (morbid obesity) (HCC)   Chronic use of opiate  drug for therapeutic purpose   Adnexal cyst   Chest pain   Chronic diastolic CHF (congestive heart failure) (HCC)   DM2 (diabetes mellitus, type 2) (HCC)   Acute pain  # Acute on chronic pancreatitis 2/2 prior alcohol use, recent start of glp-1 may have contributed. Had benign imaging one week ago, hemodynamically stable. Is s/p cholecystectomy. Triglycerides wnl. Given ongoing significant pain, repeat CT obtained today. No signs organ failure - continue fluids - continue IV pain control though as patient is tolerating liquids if no signs acute pancreatitis on CT will plan on transitioning to orals - full liquids, patient declined offer to advance diet - home creon  # History aortic valve replacement Inr goal per records is 1.5-2. 3.6 today - warfarin per pharmacy.  # Chronic pain - patient declines home methadone - holding home suboxone while treating acute pain  # Obesity noted  # HTN Bp wnl - cont home amlod, losart  # T2DM - SSI   DVT prophylaxis: coumadin Code Status: full Family Communication: none at bedside  Level of care: Med-Surg Status is: Inpatient Remains inpatient appropriate because: need for IV therapeutics    Consultants:  none  Procedures: none  Antimicrobials:  none    Subjective: Ongoing epigastric abd pain, no diarrhea or vomiting. Tolerating liquids. Reports some improvement in pain  Objective: Vitals:   09/16/23 1933 09/16/23 1957 09/16/23 2051 09/17/23 0852  BP:   (!) 127/90 130/68  Pulse: 89 85 89 67  Resp: 13 11 14 16   Temp:   97.9 F (36.6 C) 98.3 F (36.8 C)  TempSrc:   Oral Oral  SpO2: 95% 97% 96% 98%  Weight:      Height:  Intake/Output Summary (Last 24 hours) at 09/17/2023 1430 Last data filed at 09/17/2023 0503 Gross per 24 hour  Intake 1107.7 ml  Output --  Net 1107.7 ml   Filed Weights   09/15/23 1550  Weight: 120.7 kg    Examination:  General exam: Appears calm and comfortable  Respiratory  system: Clear to auscultation. Respiratory effort normal. Cardiovascular system: S1 & S2 heard, RRR. Systolic murmur Gastrointestinal system: Abdomen is nondistended, soft, ttp epigastrum Central nervous system: Alert and oriented. No focal neurological deficits. Extremities: Symmetric 5 x 5 power. Skin: No rashes, lesions or ulcers Psychiatry: Judgement and insight appear normal. Mood & affect appropriate.     Data Reviewed: I have personally reviewed following labs and imaging studies  CBC: Recent Labs  Lab 09/11/23 1608 09/15/23 1630 09/16/23 0126 09/17/23 0259  WBC 7.2 6.4 6.5 5.9  NEUTROABS 4.6  --   --   --   HGB 13.8 14.5 13.7 13.2  HCT 41.3 43.1 41.8 40.8  MCV 89.4 90.4 91.9 94.2  PLT 286 246 233 257   Basic Metabolic Panel: Recent Labs  Lab 09/11/23 1608 09/15/23 0126 09/15/23 1630 09/16/23 0126 09/17/23 0259  NA 140  --  137 133* 138  K 3.1*  --  3.3* 4.9 3.5  CL 107  --  103 102 102  CO2 22  --  24 20* 26  GLUCOSE 144*  --  97 97 124*  BUN 9  --  5* <5* 5*  CREATININE 0.87  --  0.72 0.63 0.83  CALCIUM 9.0  --  9.0 8.7* 9.0  MG  --  1.6*  --   --   --    GFR: Estimated Creatinine Clearance: 118.8 mL/min (by C-G formula based on SCr of 0.83 mg/dL). Liver Function Tests: Recent Labs  Lab 09/11/23 1608 09/15/23 1630 09/16/23 0126 09/17/23 0259  AST 25 23 23 19   ALT 16 16 13 12   ALKPHOS 72 76 72 68  BILITOT 0.6 0.7 1.0 0.6  PROT 7.4 7.7 7.6 7.1  ALBUMIN 4.0 4.2 3.9 3.8   Recent Labs  Lab 09/11/23 1608 09/15/23 1630  LIPASE 20 248*   No results for input(s): "AMMONIA" in the last 168 hours. Coagulation Profile: Recent Labs  Lab 09/16/23 0126 09/17/23 0259  INR 3.6* 3.6*   Cardiac Enzymes: No results for input(s): "CKTOTAL", "CKMB", "CKMBINDEX", "TROPONINI" in the last 168 hours. BNP (last 3 results) No results for input(s): "PROBNP" in the last 8760 hours. HbA1C: Recent Labs    09/16/23 0126  HGBA1C 6.4*   CBG: Recent Labs   Lab 09/15/23 2221 09/16/23 0751  GLUCAP 91 110*   Lipid Profile: Recent Labs    09/17/23 0259  CHOL 122  HDL 65  LDLCALC 41  TRIG 81  CHOLHDL 1.9   Thyroid Function Tests: No results for input(s): "TSH", "T4TOTAL", "FREET4", "T3FREE", "THYROIDAB" in the last 72 hours. Anemia Panel: No results for input(s): "VITAMINB12", "FOLATE", "FERRITIN", "TIBC", "IRON", "RETICCTPCT" in the last 72 hours. Urine analysis:    Component Value Date/Time   COLORURINE YELLOW (A) 09/15/2023 1736   APPEARANCEUR HAZY (A) 09/15/2023 1736   LABSPEC 1.004 (L) 09/15/2023 1736   PHURINE 6.0 09/15/2023 1736   GLUCOSEU NEGATIVE 09/15/2023 1736   HGBUR MODERATE (A) 09/15/2023 1736   BILIRUBINUR NEGATIVE 09/15/2023 1736   KETONESUR NEGATIVE 09/15/2023 1736   PROTEINUR NEGATIVE 09/15/2023 1736   NITRITE NEGATIVE 09/15/2023 1736   LEUKOCYTESUR NEGATIVE 09/15/2023 1736   Sepsis Labs: @LABRCNTIP (procalcitonin:4,lacticidven:4)  )  No results found for this or any previous visit (from the past 240 hours).       Radiology Studies: No results found.      Scheduled Meds:  amitriptyline  10 mg Oral TID   amLODipine  10 mg Oral Daily   atorvastatin  40 mg Oral Daily   insulin aspart  0-15 Units Subcutaneous TID WC   insulin aspart  0-5 Units Subcutaneous QHS   lipase/protease/amylase  36,000 Units Oral TID WC   losartan  50 mg Oral Daily   methadone  10 mg Oral QID   pantoprazole  40 mg Oral Daily   traZODone  100 mg Oral QHS   Warfarin - Pharmacist Dosing Inpatient   Does not apply q1600   Continuous Infusions:     LOS: 2 days     Silvano Bilis, MD [facility]   If 7PM-7AM, please contact night-coverage [email or web address] Password [email or web address] 09/17/2023, 2:30 PM

## 2023-09-18 DIAGNOSIS — K853 Drug induced acute pancreatitis without necrosis or infection: Secondary | ICD-10-CM | POA: Diagnosis not present

## 2023-09-18 LAB — CBC
HCT: 36.5 % (ref 36.0–46.0)
Hemoglobin: 12.1 g/dL (ref 12.0–15.0)
MCH: 30.4 pg (ref 26.0–34.0)
MCHC: 33.2 g/dL (ref 30.0–36.0)
MCV: 91.7 fL (ref 80.0–100.0)
Platelets: 250 10*3/uL (ref 150–400)
RBC: 3.98 MIL/uL (ref 3.87–5.11)
RDW: 13.1 % (ref 11.5–15.5)
WBC: 5.1 10*3/uL (ref 4.0–10.5)
nRBC: 0 % (ref 0.0–0.2)

## 2023-09-18 LAB — GLUCOSE, CAPILLARY: Glucose-Capillary: 106 mg/dL — ABNORMAL HIGH (ref 70–99)

## 2023-09-18 LAB — PROTIME-INR
INR: 2.9 — ABNORMAL HIGH (ref 0.8–1.2)
Prothrombin Time: 30.2 s — ABNORMAL HIGH (ref 11.4–15.2)

## 2023-09-18 NOTE — TOC Initial Note (Signed)
 Transition of Care [facility]) - Initial/Assessment Note    Patient Details  Name: Sarah Sosa MRN: [id number] Date of Birth: 14-Nov-1980  [facility]) CM/SW Contact:    Marlowe Sax, RN Phone Number: 09/18/2023, 9:58 AM  Clinical Narrative:                  Transition of Care [facility]) - Inpatient Brief Assessment   Patient Details  Name: Sarah Sosa MRN: [id number] Date of Birth: 1981-04-09  Transition of Care [facility]) CM/SW Contact:    Marlowe Sax, RN Phone Number: 09/18/2023, 9:58 AM   Clinical Narrative: No TOC needs identified   Transition of Care Asessment: Insurance and Status: Insurance coverage has been reviewed ([place] Medicaid) Patient has primary care physician: Yes ([facility]  Provider Role General  Office Phone [phone])   Prior level of function:: independent Prior/Current Home Services: No current home services Social Drivers of Health Review: SDOH reviewed no interventions necessary Readmission risk has been reviewed: Yes Transition of care needs: no transition of care needs at this time        Patient Goals and CMS Choice            Expected Discharge Plan and Services                                              Prior Living Arrangements/Services                       Activities of Daily Living   ADL Screening (condition at time of admission) Independently performs ADLs?: Yes (appropriate for developmental age) Is the patient deaf or have difficulty hearing?: No Does the patient have difficulty seeing, even when wearing glasses/contacts?: No Does the patient have difficulty concentrating, remembering, or making decisions?: No  Permission Sought/Granted                  Emotional Assessment              Admission diagnosis:  Pancreatitis [K85.90] Acute pancreatitis, unspecified complication status, unspecified pancreatitis type [K85.90] Patient Active Problem List    Diagnosis Date Noted   Pancreatitis 09/15/2023   Secondary hypercoagulability disorder (HCC) 09/15/2023   Adnexal cyst 09/15/2023   Chest pain 09/15/2023   Chronic diastolic CHF (congestive heart failure) (HCC) 09/15/2023   DM2 (diabetes mellitus, type 2) (HCC) 09/15/2023   Acute pain 09/15/2023   Elevated troponin 07/13/2023   Subtherapeutic international normalized ratio (INR) 07/13/2023   Chronic pancreatitis (HCC) 07/13/2023   S/p aortic valve replacement with 'On-X" valve (INR target of 1.5 to 2) and ascending aortic graft ([facility], 03/2022) 07/13/2023   CAP (community acquired pneumonia) 07/13/2023   Opioid overdose, accidental or unintentional, initial encounter (HCC) 03/20/2023   Vomiting 03/20/2023   Obesity, Class III, BMI 40-49.9 (morbid obesity) (HCC) 03/20/2023   History of pancreatitis 03/20/2023   Acute respiratory failure with hypoxia (HCC) 03/20/2023   Chronic use of opiate drug for therapeutic purpose 06/02/2022   S/P ascending aortic replacement 04/12/2022   Hypomagnesemia 11/23/2021   Essential hypertension 11/20/2021   GERD without esophagitis 11/20/2021   (HFpEF) heart failure with preserved ejection fraction (HCC) 11/20/2021   Peripheral neuropathy 11/20/2021   Asthma, chronic 11/20/2021   Acute alcoholic pancreatitis 11/20/2021   Alcohol abuse 11/20/2021   Acute pancreatitis 11/20/2021  History of heavy alcohol consumption 05/22/2021   Recurrent pancreatitis 06/04/2020   Intractable nausea and vomiting    Marijuana use    Hypertensive urgency 04/03/2020   Hypokalemia 04/03/2020   Tobacco abuse 04/03/2020   History of substance abuse (HCC) 04/03/2020   Ileus (HCC) 04/21/2018   Postoperative ileus (HCC) 04/20/2018   Postoperative state 04/15/2018   Uterine leiomyoma 03/19/2018   Menorrhagia with regular cycle 03/19/2018   Chronic pain disorder 03/19/2018   Pelvic pain 03/19/2018   Primary hypertension 03/19/2018   PCP:  [facility]patient Pharmacy:   [facility] [place] - [address] [place] [address] Phone: [phone] Fax: [phone]  [facility] [address] [place] - [address] [place] [address] Phone: [phone] Fax: [phone]     Social Drivers of Health (SDOH) Social History: SDOH Screenings   Food Insecurity: No Food Insecurity (09/16/2023)  Housing: Low Risk  (09/16/2023)  Recent Concern: Housing - High Risk (09/15/2023)   Received from [facility]  Transportation Needs: No Transportation Needs (09/16/2023)  Utilities: Not At Risk (09/16/2023)  Financial Resource Strain: High Risk (07/19/2023)   [facility]  Social Connections: Moderately Isolated (06/05/2021)   [facility] from [facility]  Stress: No Stress Concern Present (08/31/2023)   [facility] from [facility]  Tobacco Use: High Risk (09/15/2023)   SDOH Interventions:     Readmission Risk Interventions    08/04/2023    4:19 PM 07/15/2023    2:29 PM 03/21/2023    9:27 AM  Readmission Risk Prevention Plan  Transportation Screening Complete Complete Complete  PCP or Specialist Appt within 3-5 Days Complete Complete   HRI or Home Care Consult Complete Complete   Social Work Consult for Recovery Care Planning/Counseling Complete Complete   Palliative Care Screening Not Applicable Not Applicable   Medication Review Oceanographer) Complete Complete Complete  PCP or Specialist appointment within 3-5 days of discharge   Complete  HRI or Home Care Consult   Complete  SW Recovery Care/Counseling Consult   Complete  Palliative Care Screening   Not Applicable  Skilled Nursing Facility   Not Applicable

## 2023-09-18 NOTE — Progress Notes (Signed)
 PHARMACY - ANTICOAGULATION CONSULT NOTE  Pharmacy Consult for Warfarin  Indication:  On-X mechanical valve  -aortic  Allergies  Allergen Reactions   Latex Anaphylaxis, Hives and Itching    Other reaction(s): Other (See Comments) SKIN BURNING & PEELING    Nsaids Other (See Comments)    Serve ulcers; stomach aches, GERD.  Other Reaction(s): Other (See Comments)   Tomato Anaphylaxis, Hives and Itching   Gabapentin Palpitations    Has heart condition; also makes jittery   Metformin     Other Reaction(s): Abdominal Pain  Causes pancreatitis to worsen   Lisinopril     Other Reaction(s): Angioedema   Tramadol Itching and Hives   Duloxetine Anxiety    Makes her jittery    Patient Measurements: Height: 5\' 7"  (170.2 cm) Weight: 120.7 kg (266 lb) IBW/kg (Calculated) : 61.6 Heparin Dosing Weight:   Vital Signs: Temp: 98.1 F (36.7 C) (03/04 0236) BP: 100/73 (03/04 0021) Pulse Rate: 74 (03/04 0021)  Labs: Recent Labs    09/15/23 1630 09/16/23 0126 09/17/23 0259 09/18/23 0516  HGB 14.5 13.7 13.2 12.1  HCT 43.1 41.8 40.8 36.5  PLT 246 233 257 250  LABPROT  --  36.0* 36.4* 30.2*  INR  --  3.6* 3.6* 2.9*  CREATININE 0.72 0.63 0.83  --   TROPONINIHS  --  21*  --   --     Estimated Creatinine Clearance: 118.8 mL/min (by C-G formula based on SCr of 0.83 mg/dL).   Medical History: Past Medical History:  Diagnosis Date   Arthritis    Asthma    Chronic pain disorder 03/19/2018   GERD (gastroesophageal reflux disease)    Headache    Heart murmur    History of trichomoniasis 03/19/2018   Hypertension    Intractable nausea and vomiting    Pancreatitis    PID (pelvic inflammatory disease) 02/14/2018   Uterine leiomyoma 03/19/2018    Medications:    Assessment: Pharmacy consulted to dose warfarin in this 43 year old female with On-X mechanical heart valve.  Per note from [facility] cardiology,  goal INR is 1.5 - 2.0.    -per [facility] anticoagulation visit note 09/14/23:   INR was 3.3   (goal 1.5-2.0)Start warfarin 7.5 mg ONLY on Thursdays and then 3.75 mg all other days (TWD 30 mg)   Unsure of last dose.   3/2:  INR @ 0126 = 3.6, elevated    HOLD warfarin 3/3:  INR @ 0259 = 3.6, elevated    HOLD warfarin 3/4:  INR @ 0516 = 2.9, elevated    HOLD warfarin    Goal of Therapy:  INR :   1.5 - 2.0   Plan:  Supratherapeutic INR -Hold Warfarin dosing -Will check INR with AM labs -CBC per protocol  Paulita Fujita, PharmD Clinical Pharmacist 09/18/2023

## 2023-09-18 NOTE — Progress Notes (Signed)
 PROGRESS NOTE    Sarah Sosa  BMW:[id number] DOB: 1981-03-26 DOA: 09/15/2023 PCP: [facility]     Brief Narrative:   From admission h and p  43 year old female with a past medical history of chronic pancreatitis, aortic valve replacement, chronic diastolic heart failure, opiate use disorder probable who presents with nausea vomiting diarrhea and epigastric and left upper quadrant abdominal pain.  The patient reports the diarrhea has resolved however the nausea vomiting and abdominal pain has persisted and worsened, she received a Ozempic shot around that time prompting her presentation to the emergency department previous to today although enzymes were normal on September 11, 2023.  The patient had a telemedicine visit with [facility] yesterday and subsequently went to the [facility] emergency department yesterday as well however left AGAINST MEDICAL ADVICE due to the wait time prompting her presentation today.  She denies any fever or chills hematemesis does report some vague chest pain although thinks this is from her pancreatitis. Denies -OH use for weeks.   Last echocardiogram 07/16/2023 EF 70% with grade 2 diastolic dysfunction   Past medical records reviewed and summarized: [facility] telemedicine visit with gastroenterology yesterday, patient has alcohol induced pancreatitis reports abstinence although objective ethanol levels documented at previous visits, recommends outpatient MRCP, has been labeled with chronic pancreatitis despite absence of calcifications given the chronicity    Assessment & Plan:   Principal Problem:   Pancreatitis Active Problems:   Alcohol abuse   Secondary hypercoagulability disorder (HCC)   Hypokalemia   Essential hypertension   S/p aortic valve replacement with 'On-X" valve (INR target of 1.5 to 2) and ascending aortic graft ([facility], 03/2022)   Obesity, Class III, BMI 40-49.9 (morbid obesity) (HCC)   Chronic use of opiate  drug for therapeutic purpose   Adnexal cyst   Chest pain   Chronic diastolic CHF (congestive heart failure) (HCC)   DM2 (diabetes mellitus, type 2) (HCC)   Acute pain  # Acute on chronic pancreatitis 2/2 prior alcohol use, recent start of glp-1 may have contributed. Had benign imaging one week ago, hemodynamically stable. Is s/p cholecystectomy. Triglycerides wnl. Given ongoing significant pain, repeat CT obtained 3/3 which shows peri-pancreatic inflammation, no complications. No signs organ failure. Didn't tolerate food last night - continue fluids - continue IV pain control   - continue full liquids, will consider post-pyloric ng tube for enteral feeding tomorrow if still unable to advance - home creon  # History aortic valve replacement Inr goal per records is 1.5-2. 2.8 today, inr has been elevated likely 2/2 reduced po - warfarin per pharmacy.  # Chronic pain - patient declines home methadone - holding home suboxone while treating acute pain  # Obesity noted  # HTN Bp wnl - cont home amlod, losart  # T2DM Patient says she was unaware of the diagnosis. Glucose hasn't been markedly elevated here - will monitor daily sugars, hold ssi   DVT prophylaxis: coumadin Code Status: full Family Communication: none at bedside  Level of care: Med-Surg Status is: Inpatient Remains inpatient appropriate because: need for IV therapeutics    Consultants:  none  Procedures: none  Antimicrobials:  none    Subjective: Ongoing epigastric abd pain, says vomiting when tried food last night, tolerating liquids  Objective: Vitals:   09/17/23 1552 09/18/23 0021 09/18/23 0236 09/18/23 0807  BP: (!) 91/49 100/73  139/71  Pulse: 80 74  69  Resp: 16 18  16   Temp: 98.2 F (36.8 C) 100 F (37.8  C) 98.1 F (36.7 C) 98 F (36.7 C)  TempSrc: Oral   Oral  SpO2: 99% 96%  93%  Weight:      Height:        Intake/Output Summary (Last 24 hours) at 09/18/2023 1112 Last data filed  at 09/17/2023 1524 Gross per 24 hour  Intake 240 ml  Output --  Net 240 ml   Filed Weights   09/15/23 1550  Weight: 120.7 kg    Examination:  General exam: Appears calm and comfortable  Respiratory system: Clear to auscultation. Respiratory effort normal. Cardiovascular system: S1 & S2 heard, RRR. Systolic murmur Gastrointestinal system: Abdomen is nondistended, soft, ttp epigastrum Central nervous system: Alert and oriented. No focal neurological deficits. Extremities: Symmetric 5 x 5 power. Skin: No rashes, lesions or ulcers Psychiatry: Judgement and insight appear normal. Mood & affect appropriate.     Data Reviewed: I have personally reviewed following labs and imaging studies  CBC: Recent Labs  Lab 09/11/23 1608 09/15/23 1630 09/16/23 0126 09/17/23 0259 09/18/23 0516  WBC 7.2 6.4 6.5 5.9 5.1  NEUTROABS 4.6  --   --   --   --   HGB 13.8 14.5 13.7 13.2 12.1  HCT 41.3 43.1 41.8 40.8 36.5  MCV 89.4 90.4 91.9 94.2 91.7  PLT 286 246 233 257 250   Basic Metabolic Panel: Recent Labs  Lab 09/11/23 1608 09/15/23 0126 09/15/23 1630 09/16/23 0126 09/17/23 0259  NA 140  --  137 133* 138  K 3.1*  --  3.3* 4.9 3.5  CL 107  --  103 102 102  CO2 22  --  24 20* 26  GLUCOSE 144*  --  97 97 124*  BUN 9  --  5* <5* 5*  CREATININE 0.87  --  0.72 0.63 0.83  CALCIUM 9.0  --  9.0 8.7* 9.0  MG  --  1.6*  --   --   --    GFR: Estimated Creatinine Clearance: 118.8 mL/min (by C-G formula based on SCr of 0.83 mg/dL). Liver Function Tests: Recent Labs  Lab 09/11/23 1608 09/15/23 1630 09/16/23 0126 09/17/23 0259  AST 25 23 23 19   ALT 16 16 13 12   ALKPHOS 72 76 72 68  BILITOT 0.6 0.7 1.0 0.6  PROT 7.4 7.7 7.6 7.1  ALBUMIN 4.0 4.2 3.9 3.8   Recent Labs  Lab 09/11/23 1608 09/15/23 1630  LIPASE 20 248*   No results for input(s): "AMMONIA" in the last 168 hours. Coagulation Profile: Recent Labs  Lab 09/16/23 0126 09/17/23 0259 09/18/23 0516  INR 3.6* 3.6* 2.9*    Cardiac Enzymes: No results for input(s): "CKTOTAL", "CKMB", "CKMBINDEX", "TROPONINI" in the last 168 hours. BNP (last 3 results) No results for input(s): "PROBNP" in the last 8760 hours. HbA1C: Recent Labs    09/16/23 0126  HGBA1C 6.4*   CBG: Recent Labs  Lab 09/15/23 2221 09/16/23 0751 09/17/23 1620 09/17/23 2108 09/18/23 0820  GLUCAP 91 110* 144* 111* 106*   Lipid Profile: Recent Labs    09/17/23 0259  CHOL 122  HDL 65  LDLCALC 41  TRIG 81  CHOLHDL 1.9   Thyroid Function Tests: No results for input(s): "TSH", "T4TOTAL", "FREET4", "T3FREE", "THYROIDAB" in the last 72 hours. Anemia Panel: No results for input(s): "VITAMINB12", "FOLATE", "FERRITIN", "TIBC", "IRON", "RETICCTPCT" in the last 72 hours. Urine analysis:    Component Value Date/Time   COLORURINE YELLOW (A) 09/15/2023 1736   APPEARANCEUR HAZY (A) 09/15/2023 1736   LABSPEC 1.004 (  L) 09/15/2023 1736   PHURINE 6.0 09/15/2023 1736   GLUCOSEU NEGATIVE 09/15/2023 1736   HGBUR MODERATE (A) 09/15/2023 1736   BILIRUBINUR NEGATIVE 09/15/2023 1736   KETONESUR NEGATIVE 09/15/2023 1736   PROTEINUR NEGATIVE 09/15/2023 1736   NITRITE NEGATIVE 09/15/2023 1736   LEUKOCYTESUR NEGATIVE 09/15/2023 1736   Sepsis Labs: @LABRCNTIP (procalcitonin:4,lacticidven:4)  )No results found for this or any previous visit (from the past 240 hours).       Radiology Studies: CT ABDOMEN PELVIS W CONTRAST Result Date: 09/17/2023 CLINICAL DATA:  Persistent.  Prior pancreatitis EXAM: CT ABDOMEN AND PELVIS WITH CONTRAST TECHNIQUE: Multidetector CT imaging of the abdomen and pelvis was performed using the standard protocol following bolus administration of intravenous contrast. RADIATION DOSE REDUCTION: This exam was performed according to the departmental dose-optimization program which includes automated exposure control, adjustment of the mA and/or kV according to patient size and/or use of iterative reconstruction technique.  CONTRAST:  OMNIPAQUE IOHEXOL 300 MG/ML  SOLN COMPARISON:  09/11/23. FINDINGS: Lower chest: Prostatic aortic valve. Heart is slightly enlarged. There is some linear opacity at the bases likely atelectasis, increased from previous. No significant pleural effusion. Hepatobiliary: Mild fatty liver infiltration. Patent portal vein. Gallbladder is not seen. Pancreas: Increasing peripancreatic fat stranding consistent previous consistent with pancreatitis. Slightly edematous appearance to the uncinate process. Otherwise preserved enhancement to the pancreas. No rim enhancing fluid collections. No soft tissue gas. Spleen: Normal in size without focal abnormality. Patent splenic vein. Adrenals/Urinary Tract: Adrenal glands are unremarkable. Kidneys are normal, without renal calculi, focal lesion, or hydronephrosis. Bladder is collapsed. Stomach/Bowel: Stomach is nondilated. Small bowel has a normal course and caliber. Large bowel is nondilated with some scattered stool. Portions of the transverse colon are collapsed. Vascular/Lymphatic: Normal caliber aorta and IVC with scattered vascular calcifications along the aorta and branch vessels. There is some swirling of mesenteric vessels centrally, nonspecific. No specific abnormal lymph node enlargement identified in the abdomen and pelvis. Reproductive: Uterus is absent.  Stable cystic lesion in the pelvis. Other: Trace free fluid in the pelvis.  No free intra-air. Musculoskeletal: Degenerative changes seen of the lumbar spine and sacroiliac joints. Also degenerative changes of the thoracic spine with osteophyte formation and disc height loss. IMPRESSION: Increasing peripancreatic fat stranding and some mild edema of the pancreas itself. No complicating features of fluid collection, soft tissue gas. Preserved pancreatic enhancement. Increasing basilar lung bandlike opacities, atelectasis. Electronically Signed   By: Karen Kays M.D.   On: 09/17/2023 15:39         Scheduled Meds:  amitriptyline  10 mg Oral TID   amLODipine  10 mg Oral Daily   atorvastatin  40 mg Oral Daily   insulin aspart  0-15 Units Subcutaneous TID WC   insulin aspart  0-5 Units Subcutaneous QHS   lipase/protease/amylase  36,000 Units Oral TID WC   losartan  50 mg Oral Daily   pantoprazole  40 mg Oral Daily   traZODone  100 mg Oral QHS   Warfarin - Pharmacist Dosing Inpatient   Does not apply q1600   Continuous Infusions:  lactated ringers 100 mL/hr at 09/17/23 1524      LOS: 3 days     Silvano Bilis, MD [facility]   If 7PM-7AM, please contact night-coverage [email or web address] Password [email or web address] 09/18/2023, 11:12 AM

## 2023-09-19 DIAGNOSIS — K853 Drug induced acute pancreatitis without necrosis or infection: Secondary | ICD-10-CM | POA: Diagnosis not present

## 2023-09-19 LAB — PROTIME-INR
INR: 1.5 — ABNORMAL HIGH (ref 0.8–1.2)
Prothrombin Time: 18.2 s — ABNORMAL HIGH (ref 11.4–15.2)

## 2023-09-19 LAB — BASIC METABOLIC PANEL
Anion gap: 8 (ref 5–15)
BUN: <5 mg/dL — ABNORMAL LOW (ref 6–20)
CO2: 27 mmol/L (ref 22–32)
Calcium: 8.7 mg/dL — ABNORMAL LOW (ref 8.9–10.3)
Chloride: 103 mmol/L (ref 98–111)
Creatinine, Ser: 0.71 mg/dL (ref 0.44–1.00)
GFR, Estimated: >60 mL/min (ref >60)
Glucose, Bld: 106 mg/dL — ABNORMAL HIGH (ref 70–99)
Potassium: 3.3 mmol/L — ABNORMAL LOW (ref 3.5–5.1)
Sodium: 138 mmol/L (ref 135–145)

## 2023-09-19 LAB — CBC
HCT: 35 % — ABNORMAL LOW (ref 36.0–46.0)
Hemoglobin: 11.6 g/dL — ABNORMAL LOW (ref 12.0–15.0)
MCH: 30 pg (ref 26.0–34.0)
MCHC: 33.1 g/dL (ref 30.0–36.0)
MCV: 90.4 fL (ref 80.0–100.0)
Platelets: 259 10*3/uL (ref 150–400)
RBC: 3.87 MIL/uL (ref 3.87–5.11)
RDW: 13.1 % (ref 11.5–15.5)
WBC: 4.8 10*3/uL (ref 4.0–10.5)
nRBC: 0 % (ref 0.0–0.2)

## 2023-09-19 MED ORDER — ORAL CARE MOUTH RINSE: 15.0000 mL | OROMUCOSAL | Status: DC | PRN

## 2023-09-19 MED ORDER — WARFARIN SODIUM 5 MG PO TABS
5.0000 mg | ORAL_TABLET | Freq: Once | ORAL | Status: AC
  Administered 2023-09-19: 5 mg via ORAL
  Filled 2023-09-19: qty 1

## 2023-09-19 MED ORDER — POTASSIUM CHLORIDE IN NACL 20-0.9 MEQ/L-% IV SOLN
INTRAVENOUS | Status: DC
  Filled 2023-09-19 (×4): qty 1000

## 2023-09-19 NOTE — Plan of Care (Signed)

## 2023-09-19 NOTE — Progress Notes (Addendum)
 PHARMACY - ANTICOAGULATION CONSULT NOTE  Pharmacy Consult for Warfarin  Indication:  On-X mechanical aortic valve ( INR goal 1.5-2 )  Allergies  Allergen Reactions   Latex Anaphylaxis, Hives and Itching    Other reaction(s): Other (See Comments) SKIN BURNING & PEELING    Nsaids Other (See Comments)    Serve ulcers; stomach aches, GERD.  Other Reaction(s): Other (See Comments)   Tomato Anaphylaxis, Hives and Itching   Gabapentin Palpitations    Has heart condition; also makes jittery   Metformin     Other Reaction(s): Abdominal Pain  Causes pancreatitis to worsen   Lisinopril     Other Reaction(s): Angioedema   Tramadol Itching and Hives   Duloxetine Anxiety    Makes her jittery    Patient Measurements: Height: 5\' 7"  (170.2 cm) Weight: 120.7 kg (266 lb) IBW/kg (Calculated) : 61.6 Heparin Dosing Weight:   Vital Signs: Temp: 98.9 F (37.2 C) (03/04 2059) BP: 144/88 (03/04 2059) Pulse Rate: 70 (03/04 2059)  Labs: Recent Labs    09/17/23 0259 09/18/23 0516 09/19/23 0416  HGB 13.2 12.1 11.6*  HCT 40.8 36.5 35.0*  PLT 257 250 259  LABPROT 36.4* 30.2* 18.2*  INR 3.6* 2.9* 1.5*  CREATININE 0.83  --  0.71    Estimated Creatinine Clearance: 123.2 mL/min (by C-G formula based on SCr of 0.71 mg/dL).   Medical History: Past Medical History:  Diagnosis Date   Arthritis    Asthma    Chronic pain disorder 03/19/2018   GERD (gastroesophageal reflux disease)    Headache    Heart murmur    History of trichomoniasis 03/19/2018   Hypertension    Intractable nausea and vomiting    Pancreatitis    PID (pelvic inflammatory disease) 02/14/2018   Uterine leiomyoma 03/19/2018    Medications:  Warfarin 7.5 mg Thursdays and 3.75 mg all other days Total weekly warfarin: 30 mg New start Ozempic (will be stopped going forward)  Assessment: Pharmacy consulted to dose warfarin in this 43 year old female with 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 PMH includes CHF,  OUD, prior AUD, recurrent pancreatitis s/p cholecystectomy (2014). She presented with N&V and abdominal pain following Ozempic injection and has only been taking a few weeks. Potential for semaglutide to have helped contribute to acute on chronic pancreatitis and the medication will not be continued.  Per note from [facility] cardiology, goal INR is 1.5 - 2.0. Per [facility] visit note 09/14/23: -INR was 3.3   (goal 1.5-2.0) Start warfarin 7.5 mg ONLY on Thursdays and then 3.75 mg all other days (TWD 30 mg)  -Unsure of last dose.   3/2:  INR 3.6, elevated    HOLD warfarin 3/3:  INR 3.6, elevated    HOLD warfarin 3/4:  INR 2.9, elevated    HOLD warfarin 3/5:  INR 1.5       5 mg     Goal of Therapy:  INR 1.5 - 2.0   Plan: INR now therapeutic, low end of therapeutic goal. Steep decrease between yesterday and today. Per chart, patient has been eating 100% of lunches the past two days although patient is not tolerating evening meals. Warfarin 5 mg x 1 today Daily INR CBC at least every 7 days  Will M. Dareen Piano, PharmD Clinical Pharmacist 09/19/2023 7:52 AM

## 2023-09-19 NOTE — Progress Notes (Signed)
 PROGRESS NOTE    Sarah Sosa  QMV:[id number] DOB: [birth year] DOA: 09/15/2023 PCP: [facility]     Brief Narrative:   From admission h and p  43 year old female with a past medical history of chronic pancreatitis, aortic valve replacement, chronic diastolic heart failure, opiate use disorder probable who presents with nausea vomiting diarrhea and epigastric and left upper quadrant abdominal pain.  The patient reports the diarrhea has resolved however the nausea vomiting and abdominal pain has persisted and worsened, she received a Ozempic shot around that time prompting her presentation to the emergency department previous to today although enzymes were normal on September 11, 2023.  The patient had a telemedicine visit with [facility] yesterday and subsequently went to the [facility] emergency department yesterday as well however left AGAINST MEDICAL ADVICE due to the wait time prompting her presentation today.  She denies any fever or chills hematemesis does report some vague chest pain although thinks this is from her pancreatitis. Denies -OH use for weeks.   Last echocardiogram 07/16/2023 EF 70% with grade 2 diastolic dysfunction   Past medical records reviewed and summarized: [facility] telemedicine visit with gastroenterology yesterday, patient has alcohol induced pancreatitis reports abstinence although objective ethanol levels documented at previous visits, recommends outpatient MRCP, has been labeled with chronic pancreatitis despite absence of calcifications given the chronicity    Assessment & Plan:   Principal Problem:   Pancreatitis Active Problems:   Alcohol abuse   Secondary hypercoagulability disorder (HCC)   Hypokalemia   Essential hypertension   S/p aortic valve replacement with 'On-X" valve (INR target of 1.5 to 2) and ascending aortic graft ([facility], 03/2022)   Obesity, Class III, BMI 40-49.9 (morbid obesity) (HCC)   Chronic use of opiate  drug for therapeutic purpose   Adnexal cyst   Chest pain   Chronic diastolic CHF (congestive heart failure) (HCC)   DM2 (diabetes mellitus, type 2) (HCC)   Acute pain  # Acute on chronic pancreatitis 2/2 prior alcohol use, recent start of glp-1 may have contributed. Had benign imaging one week ago, hemodynamically stable. Is s/p cholecystectomy. Triglycerides wnl. Given ongoing significant pain, repeat CT obtained 3/3 which shows peri-pancreatic inflammation, no complications. No signs organ failure. Tolerating some food today - continue fluids - continue IV pain control   - continue to advance diet, if patient unable to continue to advance diet may need post-pyloric feeds - home creon  # History aortic valve replacement Inr goal per records is 1.5-2. 1.5 today, inr has been elevated likely 2/2 reduced po - warfarin per pharmacy.  # Chronic pain - patient declines home methadone - holding home suboxone while treating acute pain  # Obesity noted  # HTN Bp wnl - cont home amlod, losart  # T2DM Patient says she was unaware of the diagnosis. Glucose hasn't been markedly elevated here - will monitor daily sugars, hold ssi   DVT prophylaxis: coumadin Code Status: full Family Communication: none at bedside  Level of care: Med-Surg Status is: Inpatient Remains inpatient appropriate because: need for IV therapeutics, haven't been able to fully advance diet yet    Consultants:  none  Procedures: none  Antimicrobials:  none    Subjective: Ongoing epigastric abd pain, says tolerated some food last nigh and for lunch today  Objective: Vitals:   09/18/23 0807 09/18/23 1658 09/18/23 2059 09/19/23 0803  BP: 139/71 (!) 131/90 (!) 144/88 115/64  Pulse: 69 79 70 80  Resp: 16 16 17  16  Temp: 98 F (36.7 C) 98.1 F (36.7 C) 98.9 F (37.2 C) 98.3 F (36.8 C)  TempSrc: Oral   Oral  SpO2: 93% 95% 93% 93%  Weight:      Height:       No intake or output data in the 24  hours ending 09/19/23 1428  Filed Weights   09/15/23 1550  Weight: 120.7 kg    Examination:  General exam: Appears calm and comfortable  Respiratory system: Clear to auscultation. Respiratory effort normal. Cardiovascular system: S1 & S2 heard, RRR. Systolic murmur Gastrointestinal system: Abdomen is mildly distended, soft, ttp epigastrum Central nervous system: Alert and oriented. No focal neurological deficits. Extremities: Symmetric 5 x 5 power. Skin: No rashes, lesions or ulcers Psychiatry: Judgement and insight appear normal. Mood & affect appropriate.     Data Reviewed: I have personally reviewed following labs and imaging studies  CBC: Recent Labs  Lab 09/15/23 1630 09/16/23 0126 09/17/23 0259 09/18/23 0516 09/19/23 0416  WBC 6.4 6.5 5.9 5.1 4.8  HGB 14.5 13.7 13.2 12.1 11.6*  HCT 43.1 41.8 40.8 36.5 35.0*  MCV 90.4 91.9 94.2 91.7 90.4  PLT 246 233 257 250 259   Basic Metabolic Panel: Recent Labs  Lab 09/15/23 0126 09/15/23 1630 09/16/23 0126 09/17/23 0259 09/19/23 0416  NA  --  137 133* 138 138  K  --  3.3* 4.9 3.5 3.3*  CL  --  103 102 102 103  CO2  --  24 20* 26 27  GLUCOSE  --  97 97 124* 106*  BUN  --  5* <5* 5* <5*  CREATININE  --  0.72 0.63 0.83 0.71  CALCIUM  --  9.0 8.7* 9.0 8.7*  MG 1.6*  --   --   --   --    GFR: Estimated Creatinine Clearance: 123.2 mL/min (by C-G formula based on SCr of 0.71 mg/dL). Liver Function Tests: Recent Labs  Lab 09/15/23 1630 09/16/23 0126 09/17/23 0259  AST 23 23 19   ALT 16 13 12   ALKPHOS 76 72 68  BILITOT 0.7 1.0 0.6  PROT 7.7 7.6 7.1  ALBUMIN 4.2 3.9 3.8   Recent Labs  Lab 09/15/23 1630  LIPASE 248*   No results for input(s): "AMMONIA" in the last 168 hours. Coagulation Profile: Recent Labs  Lab 09/16/23 0126 09/17/23 0259 09/18/23 0516 09/19/23 0416  INR 3.6* 3.6* 2.9* 1.5*   Cardiac Enzymes: No results for input(s): "CKTOTAL", "CKMB", "CKMBINDEX", "TROPONINI" in the last 168  hours. BNP (last 3 results) No results for input(s): "PROBNP" in the last 8760 hours. HbA1C: No results for input(s): "HGBA1C" in the last 72 hours.  CBG: Recent Labs  Lab 09/15/23 2221 09/16/23 0751 09/17/23 1620 09/17/23 2108 09/18/23 0820  GLUCAP 91 110* 144* 111* 106*   Lipid Profile: Recent Labs    09/17/23 0259  CHOL 122  HDL 65  LDLCALC 41  TRIG 81  CHOLHDL 1.9   Thyroid Function Tests: No results for input(s): "TSH", "T4TOTAL", "FREET4", "T3FREE", "THYROIDAB" in the last 72 hours. Anemia Panel: No results for input(s): "VITAMINB12", "FOLATE", "FERRITIN", "TIBC", "IRON", "RETICCTPCT" in the last 72 hours. Urine analysis:    Component Value Date/Time   COLORURINE YELLOW (A) 09/15/2023 1736   APPEARANCEUR HAZY (A) 09/15/2023 1736   LABSPEC 1.004 (L) 09/15/2023 1736   PHURINE 6.0 09/15/2023 1736   GLUCOSEU NEGATIVE 09/15/2023 1736   HGBUR MODERATE (A) 09/15/2023 1736   BILIRUBINUR NEGATIVE 09/15/2023 1736   KETONESUR NEGATIVE 09/15/2023 1736  PROTEINUR NEGATIVE 09/15/2023 1736   NITRITE NEGATIVE 09/15/2023 1736   LEUKOCYTESUR NEGATIVE 09/15/2023 1736   Sepsis Labs: @LABRCNTIP (procalcitonin:4,lacticidven:4)  )No results found for this or any previous visit (from the past 240 hours).       Radiology Studies: No results found.       Scheduled Meds:  amitriptyline  10 mg Oral TID   amLODipine  10 mg Oral Daily   atorvastatin  40 mg Oral Daily   lipase/protease/amylase  36,000 Units Oral TID WC   losartan  50 mg Oral Daily   pantoprazole  40 mg Oral Daily   traZODone  100 mg Oral QHS   warfarin  5 mg Oral ONCE-1600   Warfarin - Pharmacist Dosing Inpatient   Does not apply q1600   Continuous Infusions:      LOS: 4 days     Silvano Bilis, MD [facility]   If 7PM-7AM, please contact night-coverage [email or web address] Password [email or web address] 09/19/2023, 2:28 PM

## 2023-09-20 DIAGNOSIS — K853 Drug induced acute pancreatitis without necrosis or infection: Secondary | ICD-10-CM | POA: Diagnosis not present

## 2023-09-20 LAB — BASIC METABOLIC PANEL
Anion gap: 10 (ref 5–15)
BUN: <5 mg/dL — ABNORMAL LOW (ref 6–20)
CO2: 26 mmol/L (ref 22–32)
Calcium: 8.9 mg/dL (ref 8.9–10.3)
Chloride: 103 mmol/L (ref 98–111)
Creatinine, Ser: 0.73 mg/dL (ref 0.44–1.00)
GFR, Estimated: >60 mL/min (ref >60)
Glucose, Bld: 98 mg/dL (ref 70–99)
Potassium: 3.5 mmol/L (ref 3.5–5.1)
Sodium: 139 mmol/L (ref 135–145)

## 2023-09-20 LAB — PROTIME-INR
INR: 1.2 (ref 0.8–1.2)
Prothrombin Time: 15 s (ref 11.4–15.2)

## 2023-09-20 MED ORDER — OXYCODONE HCL ER 15 MG PO T12A
15.0000 mg | EXTENDED_RELEASE_TABLET | Freq: Two times a day (BID) | ORAL | Status: DC
  Administered 2023-09-20 – 2023-09-22 (×4): 15 mg via ORAL
  Filled 2023-09-20 (×4): qty 1

## 2023-09-20 MED ORDER — WARFARIN SODIUM 7.5 MG PO TABS
7.5000 mg | ORAL_TABLET | Freq: Once | ORAL | Status: AC
  Administered 2023-09-20: 7.5 mg via ORAL
  Filled 2023-09-20: qty 1

## 2023-09-20 MED ORDER — OXYCODONE-ACETAMINOPHEN 7.5-325 MG PO TABS: 1.0000 | ORAL_TABLET | ORAL | Status: DC | PRN

## 2023-09-20 MED ORDER — HYDROMORPHONE HCL 1 MG/ML IJ SOLN
1.0000 mg | INTRAMUSCULAR | Status: DC | PRN
  Administered 2023-09-20 – 2023-09-21 (×7): 1 mg via INTRAVENOUS
  Filled 2023-09-20 (×6): qty 1

## 2023-09-20 NOTE — Progress Notes (Signed)
 PHARMACY - ANTICOAGULATION CONSULT NOTE  Pharmacy Consult for Warfarin  Indication:  On-X mechanical aortic valve ( INR goal 1.5-2 )  Allergies  Allergen Reactions   Latex Anaphylaxis, Hives and Itching    Other reaction(s): Other (See Comments) SKIN BURNING & PEELING    Nsaids Other (See Comments)    Serve ulcers; stomach aches, GERD.  Other Reaction(s): Other (See Comments)   Tomato Anaphylaxis, Hives and Itching   Gabapentin Palpitations    Has heart condition; also makes jittery   Metformin     Other Reaction(s): Abdominal Pain  Causes pancreatitis to worsen   Lisinopril     Other Reaction(s): Angioedema   Tramadol Itching and Hives   Duloxetine Anxiety    Makes her jittery    Patient Measurements: Height: 5\' 7"  (170.2 cm) Weight: 120.7 kg (266 lb) IBW/kg (Calculated) : 61.6 Heparin Dosing Weight:   Vital Signs: Temp: 98.6 F (37 C) (03/05 2219) Temp Source: Oral (03/05 2219) BP: 130/80 (03/05 2219) Pulse Rate: 76 (03/05 2219)  Labs: Recent Labs    09/18/23 0516 09/19/23 0416 09/20/23 0518  HGB 12.1 11.6*  --   HCT 36.5 35.0*  --   PLT 250 259  --   LABPROT 30.2* 18.2* 15.0  INR 2.9* 1.5* 1.2  CREATININE  --  0.71 0.73    Estimated Creatinine Clearance: 123.2 mL/min (by C-G formula based on SCr of 0.73 mg/dL).   Medical History: Past Medical History:  Diagnosis Date   Arthritis    Asthma    Chronic pain disorder 03/19/2018   GERD (gastroesophageal reflux disease)    Headache    Heart murmur    History of trichomoniasis 03/19/2018   Hypertension    Intractable nausea and vomiting    Pancreatitis    PID (pelvic inflammatory disease) 02/14/2018   Uterine leiomyoma 03/19/2018    Medications:  Warfarin 7.5 mg Thursdays and 3.75 mg all other days Total weekly warfarin: 30 mg New start Ozempic (will be stopped going forward)  Assessment: Pharmacy consulted to dose warfarin in this 43 year old female with On-X mechanical aortic heart valve  (2023). PMH includes CHF, OUD, prior AUD, recurrent pancreatitis s/p cholecystectomy (2014). She presented with N&V and abdominal pain following Ozempic injection and has only been taking a few weeks. Potential for semaglutide to have helped contribute to acute on chronic pancreatitis and the medication will not be continued.  Per note from [facility] cardiology, goal INR is 1.5 - 2.0. Per [facility] anticoagulation visit note 09/14/23: -INR was 3.3   (goal 1.5-2.0) Start warfarin 7.5 mg ONLY on Thursdays and then 3.75 mg all other days (TWD 30 mg)  -Unsure of last dose.   3/2:  INR 3.6, elevated     HOLD warfarin 3/3:  INR 3.6, elevated     HOLD warfarin 3/4:  INR 2.9, elevated     HOLD warfarin 3/5:  INR 1.5        5 mg 3/6:  INR 1.2, Subtherapeutic 7.5 mg     Goal of Therapy:  INR 1.5 - 2.0   Plan: INR now subtherapeutic, likely due to held doses. Per chart, patient has been eating 100% of lunches the past two days although patient is not tolerating evening meals. Warfarin 7.5 mg x 1 today Daily INR CBC at least every 7 days  Paulita Fujita, PharmD Clinical Pharmacist 09/20/2023 7:21 AM

## 2023-09-20 NOTE — Progress Notes (Signed)
 PROGRESS NOTE    Sarah Sosa  UJW:[id number] DOB: 11/04/1980 DOA: 09/15/2023 PCP: Clinic, [facility]   Assessment & Plan:   Principal Problem:   Pancreatitis Active Problems:   Alcohol abuse   Secondary hypercoagulability disorder (HCC)   Hypokalemia   Essential hypertension   S/p aortic valve replacement with 'On-X" valve (INR target of 1.5 to 2) and ascending aortic graft ([facility], 03/2022)   Obesity, Class III, BMI 40-49.9 (morbid obesity) (HCC)   Chronic use of opiate drug for therapeutic purpose   Adnexal cyst   Chest pain   Chronic diastolic CHF (congestive heart failure) (HCC)   DM2 (diabetes mellitus, type 2) (HCC)   Acute pain  Assessment and Plan: Acute on chronic pancreatitis: possibly secondary to recent start of GLP-1. Quit drinking approx 8 months ago as pt had previous hx of alcohol abuse. No vomiting today or yesterday but w/ intermittent nausea. Zofran prn. Continue on home dose of creon. Percocet, dilaudid prn for pain. Advanced diet today    Hx aortic valve replacement: continue on warfarin. Pharmacy to dose & monitor    Chronic pain: pt declines home dose of methadone. Holding home suboxone while treating acute pain    Morbid obesity: BMI 41.6. Complicates overall care & prognosis    HTN: continue on home odse of amlodipine, losartan    PreDM: as per current HbA1c 6.4 but unknown of previous HbA1c so maybe DM2. Received preDM education         DVT prophylaxis:warfarin  Code Status: full  Family Communication:  Disposition Plan: likely d/c back home  Level of care: Med-Surg     Consultants:    Procedures:   Antimicrobials:    Subjective: Pt c/o intermittent nausea   Objective: Vitals:   09/19/23 0803 09/19/23 1559 09/19/23 2219 09/20/23 0745  BP: 115/64 117/80 130/80 (!) 155/71  Pulse: 80 75 76 73  Resp: 16 18 18 18   Temp: 98.3 F (36.8 C) 98.2 F (36.8 C) 98.6 F (37 C) 98.3 F (36.8 C)  TempSrc: Oral Oral Oral Oral   SpO2: 93% 92% 100% 94%  Weight:      Height:        Intake/Output Summary (Last 24 hours) at 09/20/2023 0905 Last data filed at 09/20/2023 0132 Gross per 24 hour  Intake 608.39 ml  Output --  Net 608.39 ml   Filed Weights   09/15/23 1550  Weight: 120.7 kg    Examination:  General exam: Appears calm and comfortable. Morbidly obese  Respiratory system: decreased breath sounds b/l  Cardiovascular system: S1 & S2+. No rubs, gallops or clicks. Gastrointestinal system: Abdomen is obese, soft and nontender. Normal bowel sounds heard. Central nervous system: Alert and oriented. Moves all extremities Psychiatry: Judgement and insight appear normal. Mood & affect appropriate.     Data Reviewed: I have personally reviewed following labs and imaging studies  CBC: Recent Labs  Lab 09/15/23 1630 09/16/23 0126 09/17/23 0259 09/18/23 0516 09/19/23 0416  WBC 6.4 6.5 5.9 5.1 4.8  HGB 14.5 13.7 13.2 12.1 11.6*  HCT 43.1 41.8 40.8 36.5 35.0*  MCV 90.4 91.9 94.2 91.7 90.4  PLT 246 233 257 250 259   Basic Metabolic Panel: Recent Labs  Lab 09/15/23 0126 09/15/23 1630 09/16/23 0126 09/17/23 0259 09/19/23 0416 09/20/23 0518  NA  --  137 133* 138 138 139  K  --  3.3* 4.9 3.5 3.3* 3.5  CL  --  103 102 102 103 103  CO2  --  24 20* 26 27 26   GLUCOSE  --  97 97 124* 106* 98  BUN  --  5* <5* 5* <5* <5*  CREATININE  --  0.72 0.63 0.83 0.71 0.73  CALCIUM  --  9.0 8.7* 9.0 8.7* 8.9  MG 1.6*  --   --   --   --   --    GFR: Estimated Creatinine Clearance: 123.2 mL/min (by C-G formula based on SCr of 0.73 mg/dL). Liver Function Tests: Recent Labs  Lab 09/15/23 1630 09/16/23 0126 09/17/23 0259  AST 23 23 19   ALT 16 13 12   ALKPHOS 76 72 68  BILITOT 0.7 1.0 0.6  PROT 7.7 7.6 7.1  ALBUMIN 4.2 3.9 3.8   Recent Labs  Lab 09/15/23 1630  LIPASE 248*   No results for input(s): "AMMONIA" in the last 168 hours. Coagulation Profile: Recent Labs  Lab 09/16/23 0126 09/17/23 0259  09/18/23 0516 09/19/23 0416 09/20/23 0518  INR 3.6* 3.6* 2.9* 1.5* 1.2   Cardiac Enzymes: No results for input(s): "CKTOTAL", "CKMB", "CKMBINDEX", "TROPONINI" in the last 168 hours. BNP (last 3 results) No results for input(s): "PROBNP" in the last 8760 hours. HbA1C: No results for input(s): "HGBA1C" in the last 72 hours. CBG: Recent Labs  Lab 09/15/23 2221 09/16/23 0751 09/17/23 1620 09/17/23 2108 09/18/23 0820  GLUCAP 91 110* 144* 111* 106*   Lipid Profile: No results for input(s): "CHOL", "HDL", "LDLCALC", "TRIG", "CHOLHDL", "LDLDIRECT" in the last 72 hours. Thyroid Function Tests: No results for input(s): "TSH", "T4TOTAL", "FREET4", "T3FREE", "THYROIDAB" in the last 72 hours. Anemia Panel: No results for input(s): "VITAMINB12", "FOLATE", "FERRITIN", "TIBC", "IRON", "RETICCTPCT" in the last 72 hours. Sepsis Labs: No results for input(s): "PROCALCITON", "LATICACIDVEN" in the last 168 hours.  No results found for this or any previous visit (from the past 240 hours).       Radiology Studies: No results found.      Scheduled Meds:  amitriptyline  10 mg Oral TID   amLODipine  10 mg Oral Daily   atorvastatin  40 mg Oral Daily   lipase/protease/amylase  36,000 Units Oral TID WC   losartan  50 mg Oral Daily   pantoprazole  40 mg Oral Daily   traZODone  100 mg Oral QHS   warfarin  7.5 mg Oral ONCE-1600   Warfarin - Pharmacist Dosing Inpatient   Does not apply q1600   Continuous Infusions:  0.9 % NaCl with KCl 20 mEq / L Stopped (09/19/23 2110)     LOS: 5 days       Charise Killian, MD [facility] Pager [phone]  If 7PM-7AM, please contact night-coverage [email or web address] 09/20/2023, 9:05 AM

## 2023-09-20 NOTE — Plan of Care (Signed)
   Problem: Nutritional: Goal: Maintenance of adequate nutrition will improve Outcome: Progressing   Problem: Skin Integrity: Goal: Risk for impaired skin integrity will decrease Outcome: Progressing

## 2023-09-20 NOTE — Plan of Care (Signed)
  Problem: Education: Goal: Ability to describe self-care measures that may prevent or decrease complications (Diabetes Survival Skills Education) will improve Outcome: Progressing   Problem: Coping: Goal: Ability to adjust to condition or change in health will improve Outcome: Progressing   Problem: Fluid Volume: Goal: Ability to maintain a balanced intake and output will improve Outcome: Progressing   Problem: Clinical Measurements: Goal: Ability to maintain clinical measurements within normal limits will improve Outcome: Progressing

## 2023-09-20 NOTE — Plan of Care (Signed)

## 2023-09-21 DIAGNOSIS — K853 Drug induced acute pancreatitis without necrosis or infection: Secondary | ICD-10-CM | POA: Diagnosis not present

## 2023-09-21 LAB — BASIC METABOLIC PANEL
Anion gap: 9 (ref 5–15)
BUN: 8 mg/dL (ref 6–20)
CO2: 27 mmol/L (ref 22–32)
Calcium: 8.9 mg/dL (ref 8.9–10.3)
Chloride: 102 mmol/L (ref 98–111)
Creatinine, Ser: 0.84 mg/dL (ref 0.44–1.00)
GFR, Estimated: >60 mL/min (ref >60)
Glucose, Bld: 113 mg/dL — ABNORMAL HIGH (ref 70–99)
Potassium: 3.5 mmol/L (ref 3.5–5.1)
Sodium: 138 mmol/L (ref 135–145)

## 2023-09-21 LAB — PROTIME-INR
INR: 1.1 (ref 0.8–1.2)
Prothrombin Time: 14.4 s (ref 11.4–15.2)

## 2023-09-21 MED ORDER — HYDROMORPHONE HCL 1 MG/ML IJ SOLN
0.5000 mg | Freq: Four times a day (QID) | INTRAMUSCULAR | Status: DC | PRN
  Administered 2023-09-21 – 2023-09-22 (×3): 0.5 mg via INTRAVENOUS
  Filled 2023-09-21 (×3): qty 0.5

## 2023-09-21 MED ORDER — HYDROCODONE-ACETAMINOPHEN 10-325 MG PO TABS
1.0000 | ORAL_TABLET | ORAL | Status: DC | PRN
Start: 2023-09-21 — End: 2023-09-22
  Administered 2023-09-21 – 2023-09-22 (×2): 2 via ORAL
  Filled 2023-09-21 (×2): qty 2

## 2023-09-21 MED ORDER — WARFARIN SODIUM 5 MG PO TABS
5.0000 mg | ORAL_TABLET | Freq: Once | ORAL | Status: AC
  Administered 2023-09-21: 5 mg via ORAL
  Filled 2023-09-21: qty 1

## 2023-09-21 NOTE — Progress Notes (Signed)
 PROGRESS NOTE    Sarah Sosa  [id number]:[id number] DOB: 12/14/1980 DOA: 09/15/2023 PCP: [facility]   Assessment & Plan:   Principal Problem:   Pancreatitis Active Problems:   Alcohol abuse   Secondary hypercoagulability disorder (HCC)   Hypokalemia   Essential hypertension   S/p aortic valve replacement with 'On-X" valve (INR target of 1.5 to 2) and ascending aortic graft ([facility], 03/2022)   Obesity, Class III, BMI 40-49.9 (morbid obesity) (HCC)   Chronic use of opiate drug for therapeutic purpose   Adnexal cyst   Chest pain   Chronic diastolic CHF (congestive heart failure) (HCC)   DM2 (diabetes mellitus, type 2) (HCC)   Acute pain  Assessment and Plan: Acute on chronic pancreatitis: possibly secondary to recent start of GLP-1. Quit drinking approx 8 months ago as pt had previous hx of alcohol abuse. No vomiting. Intermittent nausea. Zofran prn. Continue on home dose of creon. Oxycontin, dilaudid prn for pain.   Hx aortic valve replacement: continue on warfarin. Pharmacy to dose & monitor   Chronic pain: oxycontin & dilaudid prn. Pt stopped taking methadone last month. Holding home suboxone while treating acute pain    Morbid obesity: BMI 41.6. Complicates overall care & prognosis    HTN: continue on home dose of losartan, amlodipine    PreDM: as per current HbA1c 6.4 but unknown of previous HbA1c so maybe DM2. Received preDM education         DVT prophylaxis:warfarin  Code Status: full  Family Communication:  Disposition Plan: likely d/c back home  Level of care: Med-Surg  Status is: Inpatient Remains inpatient appropriate because: likely d/c home tomorrow        Consultants:    Procedures:   Antimicrobials:    Subjective: Pt c/o malasie   Objective: Vitals:   09/20/23 0745 09/20/23 1415 09/20/23 2202 09/21/23 0736  BP: (!) 155/71 135/89 133/79 129/81  Pulse: 73 78 66 72  Resp: 18 18 20 16   Temp: 98.3 F (36.8 C) 98.3 F (36.8 C)  98.3 F (36.8 C) 97.9 F (36.6 C)  TempSrc: Oral     SpO2: 94% 96% 93% 94%  Weight:      Height:        Intake/Output Summary (Last 24 hours) at 09/21/2023 0906 Last data filed at 09/20/2023 1500 Gross per 24 hour  Intake 240 ml  Output --  Net 240 ml   Filed Weights   09/15/23 1550  Weight: 120.7 kg    Examination:  General exam: Appears comfortable. Morbidly obese Respiratory system: diminished breath sounds b/l  Cardiovascular system: S1 & S2+. No rubs or clicks  Gastrointestinal system: abd is soft, NT, obese & normal bowel sounds  Central nervous system: alert & oriented. Moves all extremities Psychiatry: Judgement and insight appear normal. Appropriate mood and affect    Data Reviewed: I have personally reviewed following labs and imaging studies  CBC: Recent Labs  Lab 09/15/23 1630 09/16/23 0126 09/17/23 0259 09/18/23 0516 09/19/23 0416  WBC 6.4 6.5 5.9 5.1 4.8  HGB 14.5 13.7 13.2 12.1 11.6*  HCT 43.1 41.8 40.8 36.5 35.0*  MCV 90.4 91.9 94.2 91.7 90.4  PLT 246 233 257 250 259   Basic Metabolic Panel: Recent Labs  Lab 09/15/23 0126 09/15/23 1630 09/16/23 0126 09/17/23 0259 09/19/23 0416 09/20/23 0518 09/21/23 0458  NA  --    < > 133* 138 138 139 138  K  --    < > 4.9 3.5 3.3* 3.5 3.5  CL  --    < > 102 102 103 103 102  CO2  --    < > 20* 26 27 26 27   GLUCOSE  --    < > 97 124* 106* 98 113*  BUN  --    < > <5* 5* <5* <5* 8  CREATININE  --    < > 0.63 0.83 0.71 0.73 0.84  CALCIUM  --    < > 8.7* 9.0 8.7* 8.9 8.9  MG 1.6*  --   --   --   --   --   --    < > = values in this interval not displayed.   GFR: Estimated Creatinine Clearance: 117.3 mL/min (by C-G formula based on SCr of 0.84 mg/dL). Liver Function Tests: Recent Labs  Lab 09/15/23 1630 09/16/23 0126 09/17/23 0259  AST 23 23 19   ALT 16 13 12   ALKPHOS 76 72 68  BILITOT 0.7 1.0 0.6  PROT 7.7 7.6 7.1  ALBUMIN 4.2 3.9 3.8   Recent Labs  Lab 09/15/23 1630  LIPASE 248*   No  results for input(s): "AMMONIA" in the last 168 hours. Coagulation Profile: Recent Labs  Lab 09/17/23 0259 09/18/23 0516 09/19/23 0416 09/20/23 0518 09/21/23 0458  INR 3.6* 2.9* 1.5* 1.2 1.1   Cardiac Enzymes: No results for input(s): "CKTOTAL", "CKMB", "CKMBINDEX", "TROPONINI" in the last 168 hours. BNP (last 3 results) No results for input(s): "PROBNP" in the last 8760 hours. HbA1C: No results for input(s): "HGBA1C" in the last 72 hours. CBG: Recent Labs  Lab 09/15/23 2221 09/16/23 0751 09/17/23 1620 09/17/23 2108 09/18/23 0820  GLUCAP 91 110* 144* 111* 106*   Lipid Profile: No results for input(s): "CHOL", "HDL", "LDLCALC", "TRIG", "CHOLHDL", "LDLDIRECT" in the last 72 hours. Thyroid Function Tests: No results for input(s): "TSH", "T4TOTAL", "FREET4", "T3FREE", "THYROIDAB" in the last 72 hours. Anemia Panel: No results for input(s): "VITAMINB12", "FOLATE", "FERRITIN", "TIBC", "IRON", "RETICCTPCT" in the last 72 hours. Sepsis Labs: No results for input(s): "PROCALCITON", "LATICACIDVEN" in the last 168 hours.  No results found for this or any previous visit (from the past 240 hours).       Radiology Studies: No results found.      Scheduled Meds:  amitriptyline  10 mg Oral TID   amLODipine  10 mg Oral Daily   atorvastatin  40 mg Oral Daily   lipase/protease/amylase  36,000 Units Oral TID WC   losartan  50 mg Oral Daily   oxyCODONE  15 mg Oral Q12H   pantoprazole  40 mg Oral Daily   traZODone  100 mg Oral QHS   warfarin  5 mg Oral ONCE-1600   Warfarin - Pharmacist Dosing Inpatient   Does not apply q1600   Continuous Infusions:     LOS: 6 days       Charise Killian, MD [facility] Pager [phone]  If 7PM-7AM, please contact night-coverage [email or web address] 09/21/2023, 9:06 AM

## 2023-09-21 NOTE — Progress Notes (Signed)
 PHARMACY - ANTICOAGULATION CONSULT NOTE  Pharmacy Consult for Warfarin  Indication:  On-X mechanical aortic valve ( INR goal 1.5-2 )  Allergies  Allergen Reactions   Latex Anaphylaxis, Hives and Itching    Other reaction(s): Other (See Comments) SKIN BURNING & PEELING    Nsaids Other (See Comments)    Serve ulcers; stomach aches, GERD.  Other Reaction(s): Other (See Comments)   Tomato Anaphylaxis, Hives and Itching   Gabapentin Palpitations    Has heart condition; also makes jittery   Metformin     Other Reaction(s): Abdominal Pain  Causes pancreatitis to worsen   Lisinopril     Other Reaction(s): Angioedema   Tramadol Itching and Hives   Duloxetine Anxiety    Makes her jittery    Patient Measurements: Height: 5\' 7"  (170.2 cm) Weight: 120.7 kg (266 lb) IBW/kg (Calculated) : 61.6 Heparin Dosing Weight:   Vital Signs: Temp: 98.3 F (36.8 C) (03/06 2202) BP: 133/79 (03/06 2202) Pulse Rate: 66 (03/06 2202)  Labs: Recent Labs    09/19/23 0416 09/20/23 0518 09/21/23 0458  HGB 11.6*  --   --   HCT 35.0*  --   --   PLT 259  --   --   LABPROT 18.2* 15.0 14.4  INR 1.5* 1.2 1.1  CREATININE 0.71 0.73 0.84    Estimated Creatinine Clearance: 117.3 mL/min (by C-G formula based on SCr of 0.84 mg/dL).   Medical History: Past Medical History:  Diagnosis Date   Arthritis    Asthma    Chronic pain disorder 03/19/2018   GERD (gastroesophageal reflux disease)    Headache    Heart murmur    History of trichomoniasis 03/19/2018   Hypertension    Intractable nausea and vomiting    Pancreatitis    PID (pelvic inflammatory disease) 02/14/2018   Uterine leiomyoma 03/19/2018    Medications:  Warfarin 7.5 mg Thursdays and 3.75 mg all other days Total weekly warfarin: 30 mg New start Ozempic (will be stopped going forward)  Assessment: Pharmacy consulted to dose warfarin in this 43 year old female with 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 PMH includes CHF, OUD,  prior AUD, recurrent pancreatitis s/p cholecystectomy (2014). She presented with N&V and abdominal pain following Ozempic injection and has only been taking a few weeks. Potential for semaglutide to have helped contribute to acute on chronic pancreatitis and the medication will not be continued.  Per [facility] from [facility] cardiology, goal INR is 1.5 - 2.0. Per [facility] anticoagulation visit note 09/14/23: -INR was 3.3   (goal 1.5-2.0) Start warfarin 7.5 mg ONLY on Thursdays and then 3.75 mg all other days (TWD 30 mg)  -Unsure of last dose.   3/2:  INR 3.6, elevated     HOLD warfarin 3/3:  INR 3.6, elevated     HOLD warfarin 3/4:  INR 2.9, elevated     HOLD warfarin 3/5:  INR 1.5        5 mg 3/6:  INR 1.2, Subtherapeutic 7.5 mg   3/7:  INR 1.1, Subtherapeutic 5 mg    Goal of Therapy:  INR 1.5 - 2.0   Plan: INR now subtherapeutic, likely due to held doses. Per chart, patient tolerating some meals. Warfarin 5 mg x 1 today, a slightly higher dose than her home dose to help increase her INR back within goal Daily INR CBC at least every 7 days  Paulita Fujita, PharmD Clinical Pharmacist 09/21/2023 7:07 AM

## 2023-09-22 DIAGNOSIS — K853 Drug induced acute pancreatitis without necrosis or infection: Secondary | ICD-10-CM | POA: Diagnosis not present

## 2023-09-22 LAB — BASIC METABOLIC PANEL
Anion gap: 8 (ref 5–15)
BUN: 7 mg/dL (ref 6–20)
CO2: 26 mmol/L (ref 22–32)
Calcium: 8.7 mg/dL — ABNORMAL LOW (ref 8.9–10.3)
Chloride: 104 mmol/L (ref 98–111)
Creatinine, Ser: 0.73 mg/dL (ref 0.44–1.00)
GFR, Estimated: >60 mL/min (ref >60)
Glucose, Bld: 121 mg/dL — ABNORMAL HIGH (ref 70–99)
Potassium: 3.5 mmol/L (ref 3.5–5.1)
Sodium: 138 mmol/L (ref 135–145)

## 2023-09-22 LAB — PROTIME-INR
INR: 1.1 (ref 0.8–1.2)
Prothrombin Time: 14.8 s (ref 11.4–15.2)

## 2023-09-22 MED ORDER — HYDROMORPHONE HCL 1 MG/ML IJ SOLN: 0.5000 mg | Freq: Three times a day (TID) | INTRAMUSCULAR | Status: DC | PRN

## 2023-09-22 MED ORDER — WARFARIN SODIUM 7.5 MG PO TABS
7.5000 mg | ORAL_TABLET | Freq: Once | ORAL | Status: DC
  Filled 2023-09-22: qty 1

## 2023-09-22 MED ORDER — OXYCODONE-ACETAMINOPHEN 10-325 MG PO TABS: 1.0000 | ORAL_TABLET | Freq: Four times a day (QID) | ORAL | 0 refills | Status: AC | PRN

## 2023-09-22 NOTE — Discharge Summary (Addendum)
 Physician Discharge Summary  Sarah Sosa EXB:[id number] DOB: [birth year] DOA: 09/15/2023  PCP: [facility], [facility]  Admit date: 09/15/2023 Discharge date: 09/22/2023  Admitted From: home  Disposition:  home  Recommendations for Outpatient Follow-up:  Follow up with PCP in 1-2 weeks F/u w/ pain management physician ASAP  Home Health: no  Equipment/Devices:  Discharge Condition: stable  CODE STATUS: full  Diet recommendation: Heart Healthy   Brief/Interim Summary: HPI was taken from Dr. Core: 43 year old female with a past medical history of chronic pancreatitis, aortic valve replacement, chronic diastolic heart failure, opiate use disorder probable who presents with nausea vomiting diarrhea and epigastric and left upper quadrant abdominal pain.  The patient reports the diarrhea has resolved however the nausea vomiting and abdominal pain has persisted and worsened, she received a Ozempic shot around that time prompting her presentation to the emergency department previous to today although enzymes were normal on September 11, 2023.  The patient had a telemedicine visit with [facility] yesterday and subsequently went to the [facility] emergency department yesterday as well however left AGAINST MEDICAL ADVICE due to the wait time prompting her presentation today.  She denies any fever or chills hematemesis does report some vague chest pain although thinks this is from her pancreatitis. Denies -OH use for weeks.   Last echocardiogram 07/16/2023 EF 70% with grade 2 diastolic dysfunction   Past medical records reviewed and summarized: [facility] telemedicine visit with gastroenterology yesterday, patient has alcohol induced pancreatitis reports abstinence although objective ethanol levels documented at previous visits, recommends outpatient MRCP, has been labeled with chronic pancreatitis despite absence of calcifications given the chronicity   Medical records reviewed and  summarized: [facility] outpatient anticoagulation visit August 31, 2023: INR of 2.3 for goal of 1.5 to 2; Current prescribed warfarin dose: 7.5 mg Tues/Thurs and 3.75 mg all other days (TWD 33.75 mg) Current reported warfarin dose:7.5 mg Tues/Thurs and 3.75 mg all other days (TWD 33.75 mg)    Past medical records reviewed and summarized: Patient's last outpatient visit at [facility] on August 31, 2023, at that point was removed of methadone and oxycodone and started on Suboxone 32 mg, likely opiate use disorder was documented in addition to chronic pain at that time started on Ozempic at that time, PDMP also reviewed: Filled  Written  ID  Drug  QTY  Days  Prescriber  RX #  Dispenser  Refill  Daily Dose*  Pymt Type  PMP  09/10/2023 09/10/2023 1 Suboxone 8 Mg-2 Mg Sl Film 100.00 25 [place] [id number] Ved (0127) 0/0 32.00 mg Medicaid [place] 08/27/2023 08/16/2023 1 Oxycodone Hcl (Ir) 5 Mg Tablet 115.00 30 [place] [id number] Ved (0127) 0/0 28.75 MME Medicaid [place] 08/17/2023 08/17/2023 1 Suboxone 8 Mg-2 Mg Sl Film 100.00 25 [place] [id number] Ved (0127) 0/0 32.00 mg Medicaid [place] 07/31/2023 07/09/2023 1 Methadone Hcl 10 Mg Tablet 120.00 30 [place] [id number] Ved (0127) 0/0 188.00 MME Medicaid [address] 07/23/2023 07/09/2023 1 Oxycodone Hcl (Ir) 5 Mg Tablet 115.00 29 [place] [id number] Ved (0127) 0/0 29.74 MME Medicaid [place] 07/19/2023 07/09/2023 1 Suboxone 8 Mg-2 Mg Sl Film 100.00 25 [place] [id number] Ved (0127) 0/0 32.00 mg Medicaid  07/02/2023 06/22/2023 1 Methadone Hcl 10 Mg Tablet 120.00 30 [place] [id number] Ved (0127) 0/0 188.00 MME Other   Discharge Diagnoses:  Principal Problem:   Pancreatitis Active Problems:   Alcohol abuse   Secondary hypercoagulability disorder (HCC)   Hypokalemia   Essential hypertension   S/p aortic  valve replacement with 'On-X" valve (INR target of 1.5 to 2) and ascending aortic graft ([facility], 03/2022)   Obesity, Class III, BMI 40-49.9 (morbid obesity) (HCC)   Chronic use of opiate drug for  therapeutic purpose   Adnexal cyst   Chest pain   Chronic diastolic CHF (congestive heart failure) (HCC)   DM2 (diabetes mellitus, type 2) (HCC)   Acute pain  Acute on chronic pancreatitis: possibly secondary to recent start of GLP-1. Quit drinking approx 8 months ago as pt had previous hx of alcohol abuse. No vomiting. Intermittent nausea. Zofran prn. Continue on home dose of creon. Oxycontin, dilaudid prn for pain.   Hx aortic valve replacement: continue on warfarin. Pharmacy to dose & monitor   Chronic pain: oxycontin & dilaudid prn. Pt stopped taking methadone last month. Holding home suboxone while treating acute pain    Morbid obesity: BMI 41.6. Complicates overall care & prognosis    HTN: continue on home dose of losartan, amlodipine    PreDM: as per current HbA1c 6.4 but unknown of previous HbA1c so maybe DM2. Received preDM education   Discharge Instructions  Discharge Instructions     Diet general   Complete by: As directed    Discharge instructions   Complete by: As directed    F/u w/ PCP within 1 week. Recommend you see a pain management physician as soon as possible   Increase activity slowly   Complete by: As directed       Allergies as of 09/22/2023       Reactions   Latex Anaphylaxis, Hives, Itching   Other reaction(s): Other (See Comments) SKIN BURNING & PEELING   Nsaids Other (See Comments)   Serve ulcers; stomach aches, GERD. Other Reaction(s): Other (See Comments)   Tomato Anaphylaxis, Hives, Itching   Gabapentin Palpitations   Has heart condition; also makes jittery   Metformin    Other Reaction(s): Abdominal Pain Causes pancreatitis to worsen   Lisinopril    Other Reaction(s): Angioedema   Tramadol Itching, Hives   Duloxetine Anxiety   Makes her jittery        Medication List     TAKE these medications    albuterol 108 (90 Base) MCG/ACT inhaler Commonly known as: VENTOLIN HFA Inhale 1-2 puffs into the lungs every 6 (six) hours as  needed.   amitriptyline 10 MG tablet Commonly known as: ELAVIL Take 10 mg by mouth 3 (three) times daily as needed.   amLODipine 10 MG tablet Commonly known as: NORVASC Take 1 tablet (10 mg total) by mouth daily.   APPLE CIDER VINEGAR PO Take 1 capsule by mouth 3 (three) times daily.   atorvastatin 40 MG tablet Commonly known as: LIPITOR Take 40 mg by mouth daily.   lipase/protease/amylase 57846 UNITS Cpep capsule Commonly known as: CREON Take 1 capsule (36,000 Units total) by mouth 3 (three) times daily with meals.   losartan 50 MG tablet Commonly known as: COZAAR Take 1 tablet (50 mg total) by mouth daily.   metFORMIN 500 MG tablet Commonly known as: GLUCOPHAGE Take 500 mg by mouth every morning.   methadone 10 MG tablet Commonly known as: DOLOPHINE Take 10 mg by mouth 4 (four) times daily.   naloxone 4 MG/0.1ML Liqd nasal spray kit Commonly known as: NARCAN Place 1 spray into the nose once as needed (to reverse overdose).   omega-3 acid ethyl esters 1 g capsule Commonly known as: LOVAZA Take 1 g by mouth 2 (two) times daily.   omeprazole  40 MG capsule Commonly known as: PRILOSEC Take 40 mg by mouth daily.   ondansetron 4 MG tablet Commonly known as: Zofran Take 1 tablet (4 mg total) by mouth daily as needed for nausea or vomiting.   oxyCODONE-acetaminophen 10-325 MG tablet Commonly known as: Percocet Take 1 tablet by mouth every 6 (six) hours as needed for up to 5 days for pain.   spironolactone 25 MG tablet Commonly known as: ALDACTONE Take 25 mg by mouth daily.   traZODone 50 MG tablet Commonly known as: DESYREL Take 100 mg by mouth at bedtime.   warfarin 7.5 MG tablet Commonly known as: COUMADIN Take 3.25-7.5 mg by mouth daily. Take 3.25mg   M/W/F/S/Sun and T/Th 7.5mg  (1 tab)        Allergies  Allergen Reactions   Latex Anaphylaxis, Hives and Itching    Other reaction(s): Other (See Comments) SKIN BURNING & PEELING    Nsaids Other (See  Comments)    Serve ulcers; stomach aches, GERD.  Other Reaction(s): Other (See Comments)   Tomato Anaphylaxis, Hives and Itching   Gabapentin Palpitations    Has heart condition; also makes jittery   Metformin     Other Reaction(s): Abdominal Pain  Causes pancreatitis to worsen   Lisinopril     Other Reaction(s): Angioedema   Tramadol Itching and Hives   Duloxetine Anxiety    Makes her jittery    Consultations:    Procedures/Studies: CT ABDOMEN PELVIS W CONTRAST Result Date: 09/17/2023 CLINICAL DATA:  Persistent.  Prior pancreatitis EXAM: CT ABDOMEN AND PELVIS WITH CONTRAST TECHNIQUE: Multidetector CT imaging of the abdomen and pelvis was performed using the standard protocol following bolus administration of intravenous contrast. RADIATION DOSE REDUCTION: This exam was performed according to the departmental dose-optimization program which includes automated exposure control, adjustment of the mA and/or kV according to patient size and/or use of iterative reconstruction technique. CONTRAST:  OMNIPAQUE IOHEXOL 300 MG/ML  SOLN COMPARISON:  09/11/23. FINDINGS: Lower chest: Prostatic aortic valve. Heart is slightly enlarged. There is some linear opacity at the bases likely atelectasis, increased from previous. No significant pleural effusion. Hepatobiliary: Mild fatty liver infiltration. Patent portal vein. Gallbladder is not seen. Pancreas: Increasing peripancreatic fat stranding consistent previous consistent with pancreatitis. Slightly edematous appearance to the uncinate process. Otherwise preserved enhancement to the pancreas. No rim enhancing fluid collections. No soft tissue gas. Spleen: Normal in size without focal abnormality. Patent splenic vein. Adrenals/Urinary Tract: Adrenal glands are unremarkable. Kidneys are normal, without renal calculi, focal lesion, or hydronephrosis. Bladder is collapsed. Stomach/Bowel: Stomach is nondilated. Small bowel has a normal course and caliber.  Large bowel is nondilated with some scattered stool. Portions of the transverse colon are collapsed. Vascular/Lymphatic: Normal caliber aorta and IVC with scattered vascular calcifications along the aorta and branch vessels. There is some swirling of mesenteric vessels centrally, nonspecific. No specific abnormal lymph node enlargement identified in the abdomen and pelvis. Reproductive: Uterus is absent.  Stable cystic lesion in the pelvis. Other: Trace free fluid in the pelvis.  No free intra-air. Musculoskeletal: Degenerative changes seen of the lumbar spine and sacroiliac joints. Also degenerative changes of the thoracic spine with osteophyte formation and disc height loss. IMPRESSION: Increasing peripancreatic fat stranding and some mild edema of the pancreas itself. No complicating features of fluid collection, soft tissue gas. Preserved pancreatic enhancement. Increasing basilar lung bandlike opacities, atelectasis. Electronically Signed   By: Karen Kays M.D.   On: 09/17/2023 15:39   CT ABDOMEN PELVIS W CONTRAST Result  Date: 09/11/2023 CLINICAL DATA:  Pancreatitis. Chronic pancreatitis. Lower abdominal pain for approximately 5 days. EXAM: CT ABDOMEN AND PELVIS WITH CONTRAST TECHNIQUE: Multidetector CT imaging of the abdomen and pelvis was performed using the standard protocol following bolus administration of intravenous contrast. RADIATION DOSE REDUCTION: This exam was performed according to the departmental dose-optimization program which includes automated exposure control, adjustment of the mA and/or kV according to patient size and/or use of iterative reconstruction technique. CONTRAST:  OMNIPAQUE IOHEXOL 300 MG/ML  SOLN COMPARISON:  None Available. FINDINGS: Lower chest: Lung bases are clear. Hepatobiliary: No focal hepatic lesion. Postcholecystectomy. No biliary dilatation. Pancreas: No clear pancreatic inflammation. Pancreatic duct is normal. Known retroperitoneal fluid or inflammation  identified. No fluid collections. Spleen: Normal spleen Adrenals/urinary tract: Adrenal glands and kidneys are normal. The ureters and bladder normal. Stomach/Bowel: The stomach, duodenum, and small bowel normal. The colon and rectosigmoid colon are normal. Vascular/Lymphatic: Abdominal aorta is normal caliber. No periportal or retroperitoneal adenopathy. No pelvic adenopathy. Reproductive: Post hysterectomy. Cyst in the deep RIGHT pelvis measuring 3.5 cm compares to 3.7 cm small amount debris within the cyst (image 71/2). Other: No free fluid. Musculoskeletal: No aggressive osseous lesion. IMPRESSION: 1. No evidence of pancreatitis. No retroperitoneal fluid or inflammation. 2. Post cholecystectomy. No biliary dilatation. 3. Normal bowel. 4. RIGHT adnexal cyst with small amount debris. No change from comparison exam. Electronically Signed   By: Genevive Bi M.D.   On: 09/11/2023 20:03   DG Chest 2 View Result Date: 09/11/2023 CLINICAL DATA:  Pneumonia EXAM: CHEST - 2 VIEW COMPARISON:  X-ray 07/12/2023.  CTA 07/13/2023. FINDINGS: Sternal wires. No consolidation, pneumothorax or effusion. No edema. Normal cardiopericardial silhouette. Prosthetic aortic valve. Tortuous ectatic aorta. IMPRESSION: Postop chest.  No acute cardiopulmonary disease. Electronically Signed   By: Karen Kays M.D.   On: 09/11/2023 18:55   (Echo, Carotid, EGD, Colonoscopy, ERCP)    Subjective: Pt c/o chronic pain.   Discharge Exam: Vitals:   09/21/23 1950 09/22/23 0738  BP: 136/77 118/68  Pulse: 80 65  Resp: 14 16  Temp: 99.9 F (37.7 C) 98.3 F (36.8 C)  SpO2: 91% 93%   Vitals:   09/21/23 0736 09/21/23 1614 09/21/23 1950 09/22/23 0738  BP: 129/81 134/78 136/77 118/68  Pulse: 72 72 80 65  Resp: 16 16 14 16   Temp: 97.9 F (36.6 C) 98.2 F (36.8 C) 99.9 F (37.7 C) 98.3 F (36.8 C)  TempSrc:  Oral  Oral  SpO2: 94% 95% 91% 93%  Weight:      Height:        General: Pt is alert, awake, not in acute distress.  Morbidly obese Cardiovascular: S1/S2 +, no rubs, no gallops Respiratory: CTA bilaterally, no wheezing, no rhonchi Abdominal: Soft, NT, obese, bowel sounds + Extremities:  no cyanosis    The results of significant diagnostics from this hospitalization (including imaging, microbiology, ancillary and laboratory) are listed below for reference.     Microbiology: No results found for this or any previous visit (from the past 240 hours).   Labs: BNP (last 3 results) No results for input(s): "BNP" in the last 8760 hours. Basic Metabolic Panel: Recent Labs  Lab 09/17/23 0259 09/19/23 0416 09/20/23 0518 09/21/23 0458 09/22/23 0423  NA 138 138 139 138 138  K 3.5 3.3* 3.5 3.5 3.5  CL 102 103 103 102 104  CO2 26 27 26 27 26   GLUCOSE 124* 106* 98 113* 121*  BUN 5* <5* <5* 8 7  CREATININE  0.83 0.71 0.73 0.84 0.73  CALCIUM 9.0 8.7* 8.9 8.9 8.7*   Liver Function Tests: Recent Labs  Lab 09/15/23 1630 09/16/23 0126 09/17/23 0259  AST 23 23 19   ALT 16 13 12   ALKPHOS 76 72 68  BILITOT 0.7 1.0 0.6  PROT 7.7 7.6 7.1  ALBUMIN 4.2 3.9 3.8   Recent Labs  Lab 09/15/23 1630  LIPASE 248*   No results for input(s): "AMMONIA" in the last 168 hours. CBC: Recent Labs  Lab 09/15/23 1630 09/16/23 0126 09/17/23 0259 09/18/23 0516 09/19/23 0416  WBC 6.4 6.5 5.9 5.1 4.8  HGB 14.5 13.7 13.2 12.1 11.6*  HCT 43.1 41.8 40.8 36.5 35.0*  MCV 90.4 91.9 94.2 91.7 90.4  PLT 246 233 257 250 259   Cardiac Enzymes: No results for input(s): "CKTOTAL", "CKMB", "CKMBINDEX", "TROPONINI" in the last 168 hours. BNP: Invalid input(s): "POCBNP" CBG: Recent Labs  Lab 09/15/23 2221 09/16/23 0751 09/17/23 1620 09/17/23 2108 09/18/23 0820  GLUCAP 91 110* 144* 111* 106*   D-Dimer No results for input(s): "DDIMER" in the last 72 hours. Hgb A1c No results for input(s): "HGBA1C" in the last 72 hours. Lipid Profile No results for input(s): "CHOL", "HDL", "LDLCALC", "TRIG", "CHOLHDL", "LDLDIRECT"  in the last 72 hours. Thyroid function studies No results for input(s): "TSH", "T4TOTAL", "T3FREE", "THYROIDAB" in the last 72 hours.  Invalid input(s): "FREET3" Anemia work up No results for input(s): "VITAMINB12", "FOLATE", "FERRITIN", "TIBC", "IRON", "RETICCTPCT" in the last 72 hours. Urinalysis    Component Value Date/Time   COLORURINE YELLOW (A) 09/15/2023 1736   APPEARANCEUR HAZY (A) 09/15/2023 1736   LABSPEC 1.004 (L) 09/15/2023 1736   PHURINE 6.0 09/15/2023 1736   GLUCOSEU NEGATIVE 09/15/2023 1736   HGBUR MODERATE (A) 09/15/2023 1736   BILIRUBINUR NEGATIVE 09/15/2023 1736   KETONESUR NEGATIVE 09/15/2023 1736   PROTEINUR NEGATIVE 09/15/2023 1736   NITRITE NEGATIVE 09/15/2023 1736   LEUKOCYTESUR NEGATIVE 09/15/2023 1736   Sepsis Labs Recent Labs  Lab 09/16/23 0126 09/17/23 0259 09/18/23 0516 09/19/23 0416  WBC 6.5 5.9 5.1 4.8   Microbiology No results found for this or any previous visit (from the past 240 hours).   Time coordinating discharge: Over 30 minutes  SIGNED:   Charise Killian, MD  [facility] 09/22/2023, 11:04 AM Pager   If 7PM-7AM, please contact night-coverage [email or web address]

## 2023-09-22 NOTE — Plan of Care (Signed)
  Problem: Education: Goal: Ability to describe self-care measures that may prevent or decrease complications (Diabetes Survival Skills Education) will improve Outcome: Progressing   Problem: Pain Managment: Goal: General experience of comfort will improve and/or be controlled Outcome: Progressing

## 2023-09-22 NOTE — Progress Notes (Addendum)
 D/C AVS completed and reviewed with pt. All opportunities for questions answered and clarified. IV removed. Pt will be wheeled down to car at medical mall entrance via wheelchair.  D/c barrier: awaiting ride

## 2023-09-22 NOTE — Progress Notes (Signed)
 PHARMACY - ANTICOAGULATION CONSULT NOTE  Pharmacy Consult for Warfarin  Indication:  On-X mechanical aortic valve ( INR goal 1.5-2 )  Allergies  Allergen Reactions   Latex Anaphylaxis, Hives and Itching    Other reaction(s): Other (See Comments) SKIN BURNING & PEELING    Nsaids Other (See Comments)    Serve ulcers; stomach aches, GERD.  Other Reaction(s): Other (See Comments)   Tomato Anaphylaxis, Hives and Itching   Gabapentin Palpitations    Has heart condition; also makes jittery   Metformin     Other Reaction(s): Abdominal Pain  Causes pancreatitis to worsen   Lisinopril     Other Reaction(s): Angioedema   Tramadol Itching and Hives   Duloxetine Anxiety    Makes her jittery    Patient Measurements: Height: 5\' 7"  (170.2 cm) Weight: 120.7 kg (266 lb) IBW/kg (Calculated) : 61.6 Heparin Dosing Weight:   Vital Signs: Temp: 98.3 F (36.8 C) (03/08 0738) Temp Source: Oral (03/08 0738) BP: 118/68 (03/08 0738) Pulse Rate: 65 (03/08 0738)  Labs: Recent Labs    09/20/23 0518 09/21/23 0458 09/22/23 0423  LABPROT 15.0 14.4 14.8  INR 1.2 1.1 1.1  CREATININE 0.73 0.84 0.73    Estimated Creatinine Clearance: 123.2 mL/min (by C-G formula based on SCr of 0.73 mg/dL).   Medical History: Past Medical History:  Diagnosis Date   Arthritis    Asthma    Chronic pain disorder 03/19/2018   GERD (gastroesophageal reflux disease)    Headache    Heart murmur    History of trichomoniasis 03/19/2018   Hypertension    Intractable nausea and vomiting    Pancreatitis    PID (pelvic inflammatory disease) 02/14/2018   Uterine leiomyoma 03/19/2018    Medications:  Warfarin 7.5 mg Thursdays and 3.75 mg all other days Total weekly warfarin: 30 mg New start Ozempic (will be stopped going forward)  Assessment: Pharmacy consulted to dose warfarin in this 43 year old female with 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 PMH includes CHF, OUD, prior AUD, recurrent pancreatitis  s/p cholecystectomy (2014). She presented with N&V and abdominal pain following Ozempic injection and has only been taking a few weeks. Potential for semaglutide to have helped contribute to acute on chronic pancreatitis and the medication will not be continued.  Per note from [facility] cardiology, goal INR is 1.5 - 2.0. Per [facility] anticoagulation visit note 09/14/23: -INR was 3.3   (goal 1.5-2.0) Start warfarin 7.5 mg ONLY on Thursdays and then 3.75 mg all other days (TWD 30 mg)  -Unsure of last dose.   3/2:  INR 3.6, elevated     HOLD warfarin 3/3:  INR 3.6, elevated     HOLD warfarin 3/4:  INR 2.9, elevated     HOLD warfarin 3/5:  INR 1.5        5 mg 3/6:  INR 1.2, Subtherapeutic 7.5 mg   3/7:  INR 1.1, Subtherapeutic 5 mg  3/8:  INR 1.1, Subtherapeutic   Goal of Therapy:  INR 1.5 - 2.0   Plan: INR now subtherapeutic, likely due to held doses. Per chart, patient tolerating some meals. Warfarin 7.5 mg x 1 today, a higher dose than her home dose to help increase her INR back within goal Daily INR CBC at least every 7 days  Bettey Costa, PharmD Clinical Pharmacist 09/22/2023 8:16 AM

## 2023-10-11 ENCOUNTER — Emergency Department
Admission: EM | Admit: 2023-10-11 | Discharge: 2023-10-11 | Disposition: A | Discharge status: Home / Self Care | Attending: Emergency Medicine | Admitting: Emergency Medicine

## 2023-10-11 ENCOUNTER — Other Ambulatory Visit: Payer: Self-pay

## 2023-10-11 DIAGNOSIS — R112 Nausea with vomiting, unspecified: Secondary | ICD-10-CM

## 2023-10-11 DIAGNOSIS — I10 Essential (primary) hypertension: Secondary | ICD-10-CM | POA: Diagnosis not present

## 2023-10-11 DIAGNOSIS — R1013 Epigastric pain: Secondary | ICD-10-CM | POA: Diagnosis present

## 2023-10-11 DIAGNOSIS — R079 Chest pain, unspecified: Secondary | ICD-10-CM | POA: Insufficient documentation

## 2023-10-11 DIAGNOSIS — K859 Acute pancreatitis without necrosis or infection, unspecified: Secondary | ICD-10-CM | POA: Insufficient documentation

## 2023-10-11 LAB — CBC WITH DIFFERENTIAL/PLATELET
Abs Immature Granulocytes: 0.02 10*3/uL (ref 0.00–0.07)
Basophils Absolute: 0 10*3/uL (ref 0.0–0.1)
Basophils Relative: 1 %
Eosinophils Absolute: 0.4 10*3/uL (ref 0.0–0.5)
Eosinophils Relative: 9 %
HCT: 41.6 % (ref 36.0–46.0)
Hemoglobin: 14 g/dL (ref 12.0–15.0)
Immature Granulocytes: 0 %
Lymphocytes Relative: 34 %
Lymphs Abs: 1.7 10*3/uL (ref 0.7–4.0)
MCH: 29.9 pg (ref 26.0–34.0)
MCHC: 33.7 g/dL (ref 30.0–36.0)
MCV: 88.9 fL (ref 80.0–100.0)
Monocytes Absolute: 0.3 10*3/uL (ref 0.1–1.0)
Monocytes Relative: 6 %
Neutro Abs: 2.6 10*3/uL (ref 1.7–7.7)
Neutrophils Relative %: 50 %
Platelets: 190 10*3/uL (ref 150–400)
RBC: 4.68 MIL/uL (ref 3.87–5.11)
RDW: 13.1 % (ref 11.5–15.5)
Smear Review: NORMAL
WBC: 5.1 10*3/uL (ref 4.0–10.5)
nRBC: 0 % (ref 0.0–0.2)

## 2023-10-11 LAB — TROPONIN I (HIGH SENSITIVITY)
Troponin I (High Sensitivity): 33 ng/L — ABNORMAL HIGH (ref <18)
Troponin I (High Sensitivity): 29 ng/L — ABNORMAL HIGH (ref <18)

## 2023-10-11 LAB — COMPREHENSIVE METABOLIC PANEL WITH GFR
ALT: 16 U/L (ref 0–44)
AST: 22 U/L (ref 15–41)
Albumin: 3.7 g/dL (ref 3.5–5.0)
Alkaline Phosphatase: 67 U/L (ref 38–126)
Anion gap: 10 (ref 5–15)
BUN: 12 mg/dL (ref 6–20)
CO2: 23 mmol/L (ref 22–32)
Calcium: 8.9 mg/dL (ref 8.9–10.3)
Chloride: 106 mmol/L (ref 98–111)
Creatinine, Ser: 0.7 mg/dL (ref 0.44–1.00)
GFR, Estimated: >60 mL/min (ref >60)
Glucose, Bld: 123 mg/dL — ABNORMAL HIGH (ref 70–99)
Potassium: 3.3 mmol/L — ABNORMAL LOW (ref 3.5–5.1)
Sodium: 139 mmol/L (ref 135–145)
Total Bilirubin: 1 mg/dL (ref 0.0–1.2)
Total Protein: 7.2 g/dL (ref 6.5–8.1)

## 2023-10-11 LAB — LIPASE, BLOOD: Lipase: 32 U/L (ref 11–51)

## 2023-10-11 MED ORDER — ONDANSETRON HCL 4 MG/2ML IJ SOLN
4.0000 mg | Freq: Once | INTRAMUSCULAR | Status: AC
  Administered 2023-10-11: 4 mg via INTRAVENOUS
  Filled 2023-10-11: qty 2

## 2023-10-11 MED ORDER — HYDROMORPHONE HCL 1 MG/ML IJ SOLN
1.0000 mg | Freq: Once | INTRAMUSCULAR | Status: AC
  Administered 2023-10-11: 1 mg via INTRAVENOUS
  Filled 2023-10-11: qty 1

## 2023-10-11 MED ORDER — ONDANSETRON 4 MG PO TBDP: 4.0000 mg | ORAL_TABLET | Freq: Three times a day (TID) | ORAL | 0 refills | Status: DC | PRN

## 2023-10-11 MED ORDER — HYDROCODONE-ACETAMINOPHEN 5-325 MG PO TABS: 1.0000 | ORAL_TABLET | ORAL | 0 refills | Status: DC | PRN

## 2023-10-11 MED ORDER — SODIUM CHLORIDE 0.9 % IV BOLUS
1000.0000 mL | Freq: Once | INTRAVENOUS | Status: AC
  Administered 2023-10-11: 1000 mL via INTRAVENOUS

## 2023-10-11 NOTE — ED Triage Notes (Signed)
 Patient states chest pain and upper abdominal pain; history of Pancreatitis.

## 2023-10-11 NOTE — ED Provider Notes (Signed)
 [facility] Provider Note    Event Date/Time   First MD Initiated Contact with Patient 10/11/23 1106     (approximate)  History   Chief Complaint: Chest Pain  HPI  Sarah Sosa is a 43 y.o. female with a past medical history of chronic pain, gastric reflux, hypertension, recurrent pancreatitis who presents to the emergency department for nausea vomiting upper abdominal pain consistent with past episodes of pancreatitis.  According to the patient for the past 2 to 3 days she has been experiencing increased upper abdominal pain as well as nausea and vomiting consistent with her past episodes of pancreatitis.  Patient denies any fever.  No diarrhea.  Physical Exam   Triage Vital Signs: ED Triage Vitals  Encounter Vitals Group     BP 10/11/23 1020 127/78     Systolic BP Percentile --      Diastolic BP Percentile --      Pulse Rate 10/11/23 1020 78     Resp 10/11/23 1020 20     Temp 10/11/23 1020 97.8 F (36.6 C)     Temp Source 10/11/23 1020 Oral     SpO2 10/11/23 1020 98 %     Weight 10/11/23 0956 266 lb (120.7 kg)     Height 10/11/23 0956 5\' 6"  (1.676 m)     Head Circumference --      Peak Flow --      Pain Score 10/11/23 0956 5     Pain Loc --      Pain Education --      Exclude from Growth Chart --     Most recent vital signs: Vitals:   10/11/23 1020  BP: 127/78  Pulse: 78  Resp: 20  Temp: 97.8 F (36.6 C)  SpO2: 98%    General: Awake, no distress.  CV:  Good peripheral perfusion.  Regular rate and rhythm  Resp:  Normal effort.  Equal breath sounds bilaterally.  Abd:  No distention.  Soft, moderate epigastric tenderness.  No rebound or guarding  ED Results / Procedures / Treatments   EKG  EKG viewed and interpreted by myself shows a sinus rhythm at 82 bpm with a narrow QRS, normal axis, normal intervals, no concerning ST changes.  MEDICATIONS ORDERED IN ED: Medications  HYDROmorphone (DILAUDID) injection 1 mg (has no  administration in time range)  ondansetron (ZOFRAN) injection 4 mg (has no administration in time range)  sodium chloride 0.9 % bolus 1,000 mL (has no administration in time range)     IMPRESSION / MDM / ASSESSMENT AND PLAN / ED COURSE  I reviewed the triage vital signs and the nursing notes.  Patient's presentation is most consistent with acute presentation with potential threat to life or bodily function.  Patient presents to the emergency department for epigastric pain nausea and vomiting consistent with past episodes of pancreatitis per patient.  Patient has chronic pancreatitis history.  Her workup today in the emergency department so far shows no concerning findings.  Reassuring CBC, reassuring chemistry including normal LFTs.  Patient's lipase is normal however with chronic pancreatitis this does not necessarily reflect pancreatitis status.  Troponin slightly elevated at 33 however the patient has a history of chronically elevated troponin.  Will recheck after 2 hours as a precaution.  Will dose pain nausea medication IV hydrate.  As the patient states this feels consistent with past episodes of pancreatitis we will hold off on CT imaging at this time.  However the patient's pain fails  to resolve or worsens patient may require CT imaging to assess for acute pancreatitis.  Patient's workup is reassuring.  Will discharge patient home with outpatient follow-up for likely acute on chronic pancreatitis.  FINAL CLINICAL IMPRESSION(S) / ED DIAGNOSES   Epigastric pain Pancreatitis   Note:  This document was prepared using Dragon voice recognition software and may include unintentional dictation errors.   Minna Antis, MD 10/11/23 1355

## 2023-10-14 ENCOUNTER — Other Ambulatory Visit: Payer: Self-pay

## 2023-10-14 ENCOUNTER — Emergency Department

## 2023-10-14 ENCOUNTER — Emergency Department
Admission: EM | Admit: 2023-10-14 | Discharge: 2023-10-14 | Disposition: A | Discharge status: Home / Self Care | Attending: Emergency Medicine | Admitting: Emergency Medicine

## 2023-10-14 ENCOUNTER — Encounter: Payer: Self-pay | Admitting: Emergency Medicine

## 2023-10-14 DIAGNOSIS — Z79899 Other long term (current) drug therapy: Secondary | ICD-10-CM | POA: Insufficient documentation

## 2023-10-14 DIAGNOSIS — Z7901 Long term (current) use of anticoagulants: Secondary | ICD-10-CM | POA: Insufficient documentation

## 2023-10-14 DIAGNOSIS — Z7984 Long term (current) use of oral hypoglycemic drugs: Secondary | ICD-10-CM | POA: Diagnosis not present

## 2023-10-14 DIAGNOSIS — I10 Essential (primary) hypertension: Secondary | ICD-10-CM | POA: Insufficient documentation

## 2023-10-14 DIAGNOSIS — R1010 Upper abdominal pain, unspecified: Secondary | ICD-10-CM

## 2023-10-14 DIAGNOSIS — R1012 Left upper quadrant pain: Secondary | ICD-10-CM | POA: Insufficient documentation

## 2023-10-14 DIAGNOSIS — J45909 Unspecified asthma, uncomplicated: Secondary | ICD-10-CM | POA: Diagnosis not present

## 2023-10-14 DIAGNOSIS — E119 Type 2 diabetes mellitus without complications: Secondary | ICD-10-CM | POA: Diagnosis not present

## 2023-10-14 DIAGNOSIS — Z96652 Presence of left artificial knee joint: Secondary | ICD-10-CM | POA: Insufficient documentation

## 2023-10-14 LAB — COMPREHENSIVE METABOLIC PANEL WITH GFR
ALT: 17 U/L (ref 0–44)
AST: 21 U/L (ref 15–41)
Albumin: 3.7 g/dL (ref 3.5–5.0)
Alkaline Phosphatase: 65 U/L (ref 38–126)
Anion gap: 10 (ref 5–15)
BUN: 7 mg/dL (ref 6–20)
CO2: 23 mmol/L (ref 22–32)
Calcium: 9 mg/dL (ref 8.9–10.3)
Chloride: 105 mmol/L (ref 98–111)
Creatinine, Ser: 0.65 mg/dL (ref 0.44–1.00)
GFR, Estimated: >60 mL/min (ref >60)
Glucose, Bld: 114 mg/dL — ABNORMAL HIGH (ref 70–99)
Potassium: 3.3 mmol/L — ABNORMAL LOW (ref 3.5–5.1)
Sodium: 138 mmol/L (ref 135–145)
Total Bilirubin: 0.6 mg/dL (ref 0.0–1.2)
Total Protein: 7.3 g/dL (ref 6.5–8.1)

## 2023-10-14 LAB — PROTIME-INR
INR: 2.3 — ABNORMAL HIGH (ref 0.8–1.2)
Prothrombin Time: 25.3 s — ABNORMAL HIGH (ref 11.4–15.2)

## 2023-10-14 LAB — CBC
HCT: 41.1 % (ref 36.0–46.0)
Hemoglobin: 13.6 g/dL (ref 12.0–15.0)
MCH: 30.2 pg (ref 26.0–34.0)
MCHC: 33.1 g/dL (ref 30.0–36.0)
MCV: 91.3 fL (ref 80.0–100.0)
Platelets: 298 10*3/uL (ref 150–400)
RBC: 4.5 MIL/uL (ref 3.87–5.11)
RDW: 13.2 % (ref 11.5–15.5)
WBC: 5.7 10*3/uL (ref 4.0–10.5)
nRBC: 0 % (ref 0.0–0.2)

## 2023-10-14 LAB — LIPASE, BLOOD: Lipase: 28 U/L (ref 11–51)

## 2023-10-14 MED ORDER — POTASSIUM CHLORIDE 20 MEQ PO PACK
40.0000 meq | PACK | Freq: Once | ORAL | Status: AC
  Administered 2023-10-14: 40 meq via ORAL
  Filled 2023-10-14: qty 2

## 2023-10-14 MED ORDER — PANTOPRAZOLE SODIUM 40 MG IV SOLR
40.0000 mg | Freq: Once | INTRAVENOUS | Status: AC
  Administered 2023-10-14: 40 mg via INTRAVENOUS
  Filled 2023-10-14: qty 10

## 2023-10-14 MED ORDER — IOHEXOL 300 MG/ML  SOLN
100.0000 mL | Freq: Once | INTRAMUSCULAR | Status: AC | PRN
Start: 2023-10-14 — End: 2023-10-14
  Administered 2023-10-14: 100 mL via INTRAVENOUS

## 2023-10-14 MED ORDER — ACETAMINOPHEN 325 MG PO TABS
650.0000 mg | ORAL_TABLET | Freq: Once | ORAL | Status: AC
  Administered 2023-10-14: 650 mg via ORAL
  Filled 2023-10-14: qty 2

## 2023-10-14 MED ORDER — SODIUM CHLORIDE 0.9 % IV BOLUS (SEPSIS)
1000.0000 mL | Freq: Once | INTRAVENOUS | Status: AC
  Administered 2023-10-14: 1000 mL via INTRAVENOUS

## 2023-10-14 MED ORDER — HYDROMORPHONE HCL 1 MG/ML IJ SOLN
2.0000 mg | Freq: Once | INTRAMUSCULAR | Status: AC
  Administered 2023-10-14: 2 mg via INTRAVENOUS
  Filled 2023-10-14: qty 2

## 2023-10-14 MED ORDER — ONDANSETRON HCL 4 MG/2ML IJ SOLN
4.0000 mg | Freq: Once | INTRAMUSCULAR | Status: AC
  Administered 2023-10-14: 4 mg via INTRAVENOUS
  Filled 2023-10-14: qty 2

## 2023-10-14 NOTE — ED Triage Notes (Signed)
 Patient pov from home, states she has chronic pancreatitis, abdominal pain started on 03/27/205, pain has only increased

## 2023-10-14 NOTE — ED Provider Notes (Addendum)
 [facility] Provider Note    Event Date/Time   First MD Initiated Contact with Patient 10/14/23 [phone]     (approximate)   History   Abdominal Pain   HPI  Sarah Sosa is a 43 y.o. female with history of hypertension, diabetes, mechanical aortic valve on Coumadin, recurrent pancreatitis, chronic pain who presents to the emergency department with complaints of left upper quad abdominal pain, nausea and vomiting that started today.  Symptoms feel like her prior episodes of pancreatitis.  No fevers, chest pain or shortness of breath.  Has had some diarrhea.  Has had a previous partial hysterectomy, cholecystectomy.  No urinary symptoms.  States she was taking her medications at home without relief.  Reports she no longer drinks alcohol.  States that they think her episodes may be related to Ozempic.  She states that she does not want to stop her Ozempic because she has lost 20 pounds.  She states that normally in the emergency department she received Dilaudid 2 mg every 2 hours and Zofran 4 mg every 6 hours for symptom management.  History provided by patient.    Past Medical History:  Diagnosis Date   Arthritis    Asthma    Chronic pain disorder 03/19/2018   GERD (gastroesophageal reflux disease)    Headache    Heart murmur    History of trichomoniasis 03/19/2018   Hypertension    Intractable nausea and vomiting    Pancreatitis    PID (pelvic inflammatory disease) 02/14/2018   Uterine leiomyoma 03/19/2018    Past Surgical History:  Procedure Laterality Date   ABDOMINAL HYSTERECTOMY     AORTIC VALVE REPLACEMENT  03/2022   CHOLECYSTECTOMY     DENTAL SURGERY     HYSTERECTOMY ABDOMINAL WITH SALPINGECTOMY Bilateral 04/15/2018   Procedure: HYSTERECTOMY ABDOMINAL WITH BILATERAL SALPINGECTOMY;  Surgeon: Hildred Laser, MD;  Location: [facility] ORS;  Service: Gynecology;  Laterality: Bilateral;   kenn surgery Bilateral    KNEE ARTHROSCOPY Left 2012, 2013     MEDICATIONS:  Prior to Admission medications   Medication Sig Start Date End Date Taking? Authorizing Provider  albuterol (VENTOLIN HFA) 108 (90 Base) MCG/ACT inhaler Inhale 1-2 puffs into the lungs every 6 (six) hours as needed. 07/26/23   [provider]  amitriptyline (ELAVIL) 10 MG tablet Take 10 mg by mouth 3 (three) times daily as needed. 11/02/22   [provider]  amLODipine (NORVASC) 10 MG tablet Take 1 tablet (10 mg total) by mouth daily. 07/19/23   Marcelino Duster, MD  APPLE CIDER VINEGAR PO Take 1 capsule by mouth 3 (three) times daily.    [provider]  atorvastatin (LIPITOR) 40 MG tablet Take 40 mg by mouth daily. 10/17/22   [provider]  HYDROcodone-acetaminophen (NORCO/VICODIN) 5-325 MG tablet Take 1 tablet by mouth every 4 (four) hours as needed. 10/11/23   Minna Antis, MD  lipase/protease/amylase (CREON) 36000 UNITS CPEP capsule Take 1 capsule (36,000 Units total) by mouth 3 (three) times daily with meals. 08/13/23   Sreenath, Jonelle Sports, MD  losartan (COZAAR) 50 MG tablet Take 1 tablet (50 mg total) by mouth daily. 07/19/23   Marcelino Duster, MD  metFORMIN (GLUCOPHAGE) 500 MG tablet Take 500 mg by mouth every morning. 10/17/22   [provider]  methadone (DOLOPHINE) 10 MG tablet Take 10 mg by mouth 4 (four) times daily. 03/14/23   [provider]  naloxone [facility]) nasal spray 4 mg/0.1 mL Place 1 spray into the nose  once as needed (to reverse overdose). 02/24/23   [provider]  omega-3 acid ethyl esters (LOVAZA) 1 g capsule Take 1 g by mouth 2 (two) times daily.    [provider]  omeprazole (PRILOSEC) 40 MG capsule Take 40 mg by mouth daily. 02/23/23   [provider]  ondansetron (ZOFRAN) 4 MG tablet Take 1 tablet (4 mg total) by mouth daily as needed for nausea or vomiting. 08/13/23 08/12/24  Tresa Moore, MD  ondansetron (ZOFRAN-ODT) 4 MG disintegrating tablet Take 1 tablet  (4 mg total) by mouth every 8 (eight) hours as needed for nausea or vomiting. 10/11/23   Minna Antis, MD  spironolactone (ALDACTONE) 25 MG tablet Take 25 mg by mouth daily. 02/03/22   [provider]  traZODone (DESYREL) 50 MG tablet Take 100 mg by mouth at bedtime. 09/07/21   [provider]  warfarin (COUMADIN) 7.5 MG tablet Take 3.25-7.5 mg by mouth daily. Take 3.25mg   M/W/F/S/Sun and T/Th 7.5mg  (1 tab)    [provider]    Physical Exam   Triage Vital Signs: ED Triage Vitals  Encounter Vitals Group     BP 10/14/23 0256 (!) 169/137     Systolic BP Percentile --      Diastolic BP Percentile --      Pulse Rate 10/14/23 0256 (!) 106     Resp 10/14/23 0256 18     Temp 10/14/23 0256 98.2 F (36.8 C)     Temp Source 10/14/23 0256 Oral     SpO2 10/14/23 0256 100 %     Weight 10/14/23 0258 266 lb (120.7 kg)     Height 10/14/23 0258 5\' 6"  (1.676 m)     Head Circumference --      Peak Flow --      Pain Score 10/14/23 0258 10     Pain Loc --      Pain Education --      Exclude from Growth Chart --     Most recent vital signs: Vitals:   10/14/23 0655 10/14/23 0711  BP:  (!) 133/94  Pulse:  98  Resp: 18 18  Temp:  98.6 F (37 C)  SpO2: 100% 100%    CONSTITUTIONAL: Alert, responds appropriately to questions.  Appears uncomfortable. HEAD: Normocephalic, atraumatic EYES: Conjunctivae clear, pupils appear equal, sclera nonicteric ENT: normal nose; moist mucous membranes NECK: Supple, normal ROM CARD: RRR; S1 and S2 appreciated; + mechanical murmur RESP: Normal chest excursion without splinting or tachypnea; breath sounds clear and equal bilaterally; no wheezes, no rhonchi, no rales, no hypoxia or respiratory distress, speaking full sentences ABD/GI: Non-distended; soft, tender throughout the epigastric region and left upper quadrant, no tenderness at McBurney's point, no guarding or rebound BACK: The back appears normal EXT: Normal ROM in all  joints; no deformity noted, no edema SKIN: Normal color for age and race; warm; no rash on exposed skin NEURO: Moves all extremities equally, normal speech PSYCH: The patient's mood and manner are appropriate.   ED Results / Procedures / Treatments   LABS: (all labs ordered are listed, but only abnormal results are displayed) Labs Reviewed  PROTIME-INR - Abnormal; Notable for the following components:      Result Value   Prothrombin Time 25.3 (*)    INR 2.3 (*)    All other components within normal limits  COMPREHENSIVE METABOLIC PANEL WITH GFR - Abnormal; Notable for the following components:   Potassium 3.3 (*)    Glucose, Bld  114 (*)    All other components within normal limits  CBC  LIPASE, BLOOD     EKG:  RADIOLOGY: My personal review and interpretation of imaging:    I have personally reviewed all radiology reports.   No results found.   PROCEDURES:  Critical Care performed: No    Procedures    IMPRESSION / MDM / ASSESSMENT AND PLAN / ED COURSE  I reviewed the triage vital signs and the nursing notes.    Patient here with upper abdominal pain, vomiting.  History of recurrent pancreatitis.     DIFFERENTIAL DIAGNOSIS (includes but not limited to):   Pancreatitis, chronic pain, gastroparesis, gastritis, GERD, viral illness, doubt appendicitis   Patient's presentation is most consistent with acute presentation with potential threat to life or bodily function.   PLAN: Will obtain abdominal labs, urine.  Will give IV fluids, pain and nausea medicine.  Patient has had multiple previous CT scans some of which show no acute abnormality but also many that show uncomplicated pancreatitis.  I do not feel she needs repeat imaging at this time given I feel the risk outweigh the benefits.   MEDICATIONS GIVEN IN ED: Medications  pantoprazole (PROTONIX) injection 40 mg (has no administration in time range)  potassium chloride (KLOR-CON) packet 40 mEq (has no  administration in time range)  sodium chloride 0.9 % bolus 1,000 mL (1,000 mLs Intravenous New Bag/Given 10/14/23 0436)  HYDROmorphone (DILAUDID) injection 2 mg (2 mg Intravenous Given 10/14/23 0435)  ondansetron (ZOFRAN) injection 4 mg (4 mg Intravenous Given 10/14/23 0435)  HYDROmorphone (DILAUDID) injection 2 mg (2 mg Intravenous Given 10/14/23 0631)     ED COURSE: Labs show no leukocytosis.  Normal hemoglobin.  INR is therapeutic at 2.3.  CMP, lipase have come back unremarkable.  Discussed these normal findings with patient.  She states that she still thinks this is pancreatitis and request that we perform a CT of her abdomen pelvis.  We did discuss that she has had multiple CT scans previously and this puts her at risk for cancer later in life.  She does not feel like this is her chronic pain and continues to want a CT scan.  CT scan has been ordered.  Will sign out the oncoming ED physician.  Discussed with patient that if her CT shows no acute abnormality that she will likely be discharged with follow-up with her outpatient doctors.  She is HD stable here, in NAD, no vomiting seen in ED.  She expressed understanding.   CONSULTS: Pending further workup.   OUTSIDE RECORDS REVIEWED: Reviewed last admission for pancreatitis.    Per [place] Controlled substance reporting system:  Filled  Written  Sold  ID  Drug  QTY  Days  Prescriber  RX #  Dispenser  Refill  Daily Dose*  Pymt Type  PMP   10/11/2023 10/11/2023  1 Hydrocodone-Acetamin 5-325 Mg 8.00 2 Un Hos [id number] Ved (0127) 0/0 20.00 MME Medicaid [place]  09/22/2023 09/22/2023  1 Oxycodone-Acetaminophen 10-325 20.00 5 Ja Wil [id number] Ved (0127) 0/0 60.00 MME Medicaid [place]  09/18/2023 09/18/2023  1 Alprazolam 0.25 Mg Tablet 3.00 1 Er Gar [id number] Ved (0127) 0/0 1.50 LME Medicaid [place]  09/10/2023 09/10/2023  1 Suboxone 8 Mg-2 Mg Sl Film 100.00 25 [place] [id number] Ved (0127) 0/0 32.00 mg Medicaid [place]  08/27/2023 08/16/2023  1 Oxycodone Hcl (Ir) 5 Mg Tablet 115.00  30 [place] [id number] Ved (0127) 0/0 28.75 MME Medicaid [place]  08/17/2023 08/17/2023  1 Suboxone  8 Mg-2 Mg Sl Film 100.00 25 [place] [id number] Ved (0127) 0/0 32.00 mg Medicaid [place]  07/31/2023 07/09/2023  1 Methadone Hcl 10 Mg Tablet 120.00 30 [place] [id number] Ved (0127) 0/0 188.00 MME Medicaid [place]  07/23/2023 07/09/2023  1 Oxycodone Hcl (Ir) 5 Mg Tablet 115.00 29 [place] [id number] Ved (0127) 0/0 29.74 MME Medicaid [place]  07/19/2023 07/09/2023  1 Suboxone 8 Mg-2 Mg Sl Film 100.00 25 [place] [id number] Ved (0127) 0/0 32.00 mg Medicaid [place]     FINAL CLINICAL IMPRESSION(S) / ED DIAGNOSES   Final diagnoses:  Upper abdominal pain     Rx / DC Orders   ED Discharge Orders     None        Note:  This document was prepared using Dragon voice recognition software and may include unintentional dictation errors.      Tuyet Bader, Layla Maw, [place] 10/14/23 [phone]

## 2023-10-14 NOTE — ED Provider Notes (Signed)
 Patient received in signout from Dr. Elesa Massed pending follow-up CT abdomen pelvis.  CT imaging without evidence of acute findings.  Her blood work is reassuring.  She is still complaining about an 8 out of 10 abdominal pain and states that this is consistent with her chronic abdominal pain.  I reviewed her PMP database and discussed my suspicion that she may be having some mild withdrawal symptoms as she has not had her Suboxone prescription filled yet.  States that she has 1 or 2 left and sees her pain doctor on the fourth.  States that this is not withdrawal symptoms.  I discussed my suspicion that it is.  Patient requesting additional narcotic medications.  Informed her that I would be willing to give her dose of Suboxone while here in the ER but would not be giving any more narcotic medications for presenting symptoms today.  She is declining any Suboxone.  At this point patient does appear stable and appropriate for outpatient follow-up.   Willy Eddy, MD 10/14/23 [phone]

## 2023-10-20 ENCOUNTER — Other Ambulatory Visit: Payer: Self-pay

## 2023-10-20 DIAGNOSIS — Z8261 Family history of arthritis: Secondary | ICD-10-CM

## 2023-10-20 DIAGNOSIS — R011 Cardiac murmur, unspecified: Secondary | ICD-10-CM | POA: Diagnosis present

## 2023-10-20 DIAGNOSIS — E785 Hyperlipidemia, unspecified: Secondary | ICD-10-CM | POA: Diagnosis present

## 2023-10-20 DIAGNOSIS — Z79891 Long term (current) use of opiate analgesic: Secondary | ICD-10-CM

## 2023-10-20 DIAGNOSIS — E876 Hypokalemia: Secondary | ICD-10-CM | POA: Diagnosis present

## 2023-10-20 DIAGNOSIS — Z91040 Latex allergy status: Secondary | ICD-10-CM

## 2023-10-20 DIAGNOSIS — I10 Essential (primary) hypertension: Secondary | ICD-10-CM | POA: Diagnosis present

## 2023-10-20 DIAGNOSIS — J45909 Unspecified asthma, uncomplicated: Secondary | ICD-10-CM | POA: Diagnosis present

## 2023-10-20 DIAGNOSIS — Z79899 Other long term (current) drug therapy: Secondary | ICD-10-CM

## 2023-10-20 DIAGNOSIS — Z8249 Family history of ischemic heart disease and other diseases of the circulatory system: Secondary | ICD-10-CM

## 2023-10-20 DIAGNOSIS — K219 Gastro-esophageal reflux disease without esophagitis: Secondary | ICD-10-CM | POA: Diagnosis present

## 2023-10-20 DIAGNOSIS — K861 Other chronic pancreatitis: Secondary | ICD-10-CM | POA: Diagnosis present

## 2023-10-20 DIAGNOSIS — Z91018 Allergy to other foods: Secondary | ICD-10-CM

## 2023-10-20 DIAGNOSIS — Z952 Presence of prosthetic heart valve: Secondary | ICD-10-CM

## 2023-10-20 DIAGNOSIS — F10129 Alcohol abuse with intoxication, unspecified: Secondary | ICD-10-CM | POA: Diagnosis present

## 2023-10-20 DIAGNOSIS — G894 Chronic pain syndrome: Secondary | ICD-10-CM | POA: Diagnosis present

## 2023-10-20 DIAGNOSIS — Z888 Allergy status to other drugs, medicaments and biological substances status: Secondary | ICD-10-CM

## 2023-10-20 DIAGNOSIS — E119 Type 2 diabetes mellitus without complications: Secondary | ICD-10-CM | POA: Diagnosis present

## 2023-10-20 DIAGNOSIS — Z9049 Acquired absence of other specified parts of digestive tract: Secondary | ICD-10-CM

## 2023-10-20 DIAGNOSIS — F17210 Nicotine dependence, cigarettes, uncomplicated: Secondary | ICD-10-CM | POA: Diagnosis present

## 2023-10-20 DIAGNOSIS — Z885 Allergy status to narcotic agent status: Secondary | ICD-10-CM

## 2023-10-20 DIAGNOSIS — F1110 Opioid abuse, uncomplicated: Secondary | ICD-10-CM | POA: Diagnosis present

## 2023-10-20 DIAGNOSIS — Z7984 Long term (current) use of oral hypoglycemic drugs: Secondary | ICD-10-CM

## 2023-10-20 DIAGNOSIS — K852 Alcohol induced acute pancreatitis without necrosis or infection: Principal | ICD-10-CM | POA: Diagnosis present

## 2023-10-20 DIAGNOSIS — Z90711 Acquired absence of uterus with remaining cervical stump: Secondary | ICD-10-CM

## 2023-10-20 DIAGNOSIS — Z886 Allergy status to analgesic agent status: Secondary | ICD-10-CM

## 2023-10-20 DIAGNOSIS — M1990 Unspecified osteoarthritis, unspecified site: Secondary | ICD-10-CM | POA: Diagnosis present

## 2023-10-20 DIAGNOSIS — Z7901 Long term (current) use of anticoagulants: Secondary | ICD-10-CM

## 2023-10-20 DIAGNOSIS — Y906 Blood alcohol level of 120-199 mg/100 ml: Secondary | ICD-10-CM | POA: Diagnosis present

## 2023-10-20 NOTE — ED Triage Notes (Signed)
 Pt to ED by POV from home with c/o NVD and left sided upper abdominal pain onset of earlier today. Pt has a history of pancreatitis and states this is consistent with previous episodes. Arrives A+O, VSS, NADN.

## 2023-10-21 ENCOUNTER — Inpatient Hospital Stay
Admission: EM | Admit: 2023-10-21 | Discharge: 2023-10-26 | DRG: 440 | Disposition: A | Discharge status: Home / Self Care | Attending: Hospitalist | Admitting: Hospitalist

## 2023-10-21 DIAGNOSIS — I10 Essential (primary) hypertension: Secondary | ICD-10-CM | POA: Diagnosis present

## 2023-10-21 DIAGNOSIS — K859 Acute pancreatitis without necrosis or infection, unspecified: Principal | ICD-10-CM | POA: Diagnosis present

## 2023-10-21 DIAGNOSIS — F10929 Alcohol use, unspecified with intoxication, unspecified: Secondary | ICD-10-CM

## 2023-10-21 DIAGNOSIS — E119 Type 2 diabetes mellitus without complications: Secondary | ICD-10-CM

## 2023-10-21 DIAGNOSIS — E876 Hypokalemia: Secondary | ICD-10-CM | POA: Diagnosis present

## 2023-10-21 DIAGNOSIS — K852 Alcohol induced acute pancreatitis without necrosis or infection: Secondary | ICD-10-CM

## 2023-10-21 DIAGNOSIS — K861 Other chronic pancreatitis: Secondary | ICD-10-CM

## 2023-10-21 DIAGNOSIS — F10920 Alcohol use, unspecified with intoxication, uncomplicated: Secondary | ICD-10-CM

## 2023-10-21 DIAGNOSIS — E785 Hyperlipidemia, unspecified: Secondary | ICD-10-CM

## 2023-10-21 DIAGNOSIS — Z954 Presence of other heart-valve replacement: Secondary | ICD-10-CM

## 2023-10-21 DIAGNOSIS — G894 Chronic pain syndrome: Secondary | ICD-10-CM | POA: Insufficient documentation

## 2023-10-21 LAB — CBC
HCT: 39.4 % (ref 36.0–46.0)
HCT: 42.4 % (ref 36.0–46.0)
Hemoglobin: 13.4 g/dL (ref 12.0–15.0)
Hemoglobin: 14.3 g/dL (ref 12.0–15.0)
MCH: 30.2 pg (ref 26.0–34.0)
MCH: 30.6 pg (ref 26.0–34.0)
MCHC: 33.7 g/dL (ref 30.0–36.0)
MCHC: 34 g/dL (ref 30.0–36.0)
MCV: 89.6 fL (ref 80.0–100.0)
MCV: 90 fL (ref 80.0–100.0)
Platelets: 249 10*3/uL (ref 150–400)
Platelets: 295 10*3/uL (ref 150–400)
RBC: 4.38 MIL/uL (ref 3.87–5.11)
RBC: 4.73 MIL/uL (ref 3.87–5.11)
RDW: 13.6 % (ref 11.5–15.5)
RDW: 13.8 % (ref 11.5–15.5)
WBC: 7.3 10*3/uL (ref 4.0–10.5)
WBC: 7.4 10*3/uL (ref 4.0–10.5)
nRBC: 0 % (ref 0.0–0.2)
nRBC: 0 % (ref 0.0–0.2)

## 2023-10-21 LAB — MAGNESIUM: Magnesium: 1.5 mg/dL — ABNORMAL LOW (ref 1.7–2.4)

## 2023-10-21 LAB — LIPASE, BLOOD
Lipase: 234 U/L — ABNORMAL HIGH (ref 11–51)
Lipase: 97 U/L — ABNORMAL HIGH (ref 11–51)

## 2023-10-21 LAB — COMPREHENSIVE METABOLIC PANEL WITH GFR
ALT: 24 U/L (ref 0–44)
AST: 26 U/L (ref 15–41)
Albumin: 4.2 g/dL (ref 3.5–5.0)
Alkaline Phosphatase: 89 U/L (ref 38–126)
Anion gap: 13 (ref 5–15)
BUN: 5 mg/dL — ABNORMAL LOW (ref 6–20)
CO2: 24 mmol/L (ref 22–32)
Calcium: 8.7 mg/dL — ABNORMAL LOW (ref 8.9–10.3)
Chloride: 103 mmol/L (ref 98–111)
Creatinine, Ser: 0.58 mg/dL (ref 0.44–1.00)
GFR, Estimated: >60 mL/min (ref >60)
Glucose, Bld: 94 mg/dL (ref 70–99)
Potassium: 3.4 mmol/L — ABNORMAL LOW (ref 3.5–5.1)
Sodium: 140 mmol/L (ref 135–145)
Total Bilirubin: 0.5 mg/dL (ref 0.0–1.2)
Total Protein: 8.2 g/dL — ABNORMAL HIGH (ref 6.5–8.1)

## 2023-10-21 LAB — ETHANOL: Alcohol, Ethyl (B): 134 mg/dL — ABNORMAL HIGH (ref <10)

## 2023-10-21 LAB — BASIC METABOLIC PANEL WITH GFR
Anion gap: 10 (ref 5–15)
BUN: <5 mg/dL — ABNORMAL LOW (ref 6–20)
CO2: 22 mmol/L (ref 22–32)
Calcium: 8 mg/dL — ABNORMAL LOW (ref 8.9–10.3)
Chloride: 102 mmol/L (ref 98–111)
Creatinine, Ser: 0.47 mg/dL (ref 0.44–1.00)
GFR, Estimated: >60 mL/min (ref >60)
Glucose, Bld: 90 mg/dL (ref 70–99)
Potassium: 4.3 mmol/L (ref 3.5–5.1)
Sodium: 134 mmol/L — ABNORMAL LOW (ref 135–145)

## 2023-10-21 LAB — TROPONIN I (HIGH SENSITIVITY): Troponin I (High Sensitivity): 60 ng/L — ABNORMAL HIGH (ref <18)

## 2023-10-21 LAB — PROTIME-INR
INR: 3.5 — ABNORMAL HIGH (ref 0.8–1.2)
Prothrombin Time: 35.6 s — ABNORMAL HIGH (ref 11.4–15.2)

## 2023-10-21 LAB — CBG MONITORING, ED
Glucose-Capillary: 86 mg/dL (ref 70–99)
Glucose-Capillary: 102 mg/dL — ABNORMAL HIGH (ref 70–99)

## 2023-10-21 MED ORDER — INSULIN ASPART 100 UNIT/ML IJ SOLN
0.0000 [IU] | INTRAMUSCULAR | Status: DC
  Administered 2023-10-22 – 2023-10-24 (×2): 1 [IU] via SUBCUTANEOUS
  Filled 2023-10-21 (×2): qty 1

## 2023-10-21 MED ORDER — ONDANSETRON HCL 4 MG PO TABS
4.0000 mg | ORAL_TABLET | Freq: Four times a day (QID) | ORAL | Status: DC | PRN
  Administered 2023-10-23: 4 mg via ORAL
  Filled 2023-10-21: qty 1

## 2023-10-21 MED ORDER — ATORVASTATIN CALCIUM 20 MG PO TABS
40.0000 mg | ORAL_TABLET | Freq: Every day | ORAL | Status: DC
  Administered 2023-10-21 – 2023-10-26 (×5): 40 mg via ORAL
  Filled 2023-10-21 (×5): qty 2

## 2023-10-21 MED ORDER — FENTANYL CITRATE PF 50 MCG/ML IJ SOSY: 25.0000 ug | PREFILLED_SYRINGE | INTRAMUSCULAR | Status: DC | PRN

## 2023-10-21 MED ORDER — METHADONE HCL 10 MG PO TABS
10.0000 mg | ORAL_TABLET | Freq: Four times a day (QID) | ORAL | Status: DC
  Filled 2023-10-21: qty 1

## 2023-10-21 MED ORDER — HYDROMORPHONE HCL 1 MG/ML IJ SOLN
1.0000 mg | INTRAMUSCULAR | Status: DC | PRN
  Administered 2023-10-21 (×2): 1 mg via INTRAVENOUS
  Filled 2023-10-21 (×2): qty 1

## 2023-10-21 MED ORDER — SODIUM CHLORIDE 0.9 % IV BOLUS (SEPSIS)
1000.0000 mL | Freq: Once | INTRAVENOUS | Status: AC
  Administered 2023-10-21: 1000 mL via INTRAVENOUS

## 2023-10-21 MED ORDER — ONDANSETRON HCL 4 MG/2ML IJ SOLN
4.0000 mg | Freq: Four times a day (QID) | INTRAMUSCULAR | Status: DC | PRN
  Administered 2023-10-21 – 2023-10-25 (×4): 4 mg via INTRAVENOUS
  Filled 2023-10-21 (×6): qty 2

## 2023-10-21 MED ORDER — HYDROCODONE-ACETAMINOPHEN 5-325 MG PO TABS
1.0000 | ORAL_TABLET | ORAL | Status: DC | PRN
  Administered 2023-10-21 – 2023-10-23 (×2): 1 via ORAL
  Filled 2023-10-21 (×3): qty 1

## 2023-10-21 MED ORDER — ALBUTEROL SULFATE HFA 108 (90 BASE) MCG/ACT IN AERS: 1.0000 | INHALATION_SPRAY | Freq: Four times a day (QID) | RESPIRATORY_TRACT | Status: DC | PRN

## 2023-10-21 MED ORDER — SODIUM CHLORIDE 0.9 % IV SOLN: INTRAVENOUS | Status: DC

## 2023-10-21 MED ORDER — HYDROMORPHONE HCL 1 MG/ML IJ SOLN: 1.0000 mg | INTRAMUSCULAR | Status: DC | PRN

## 2023-10-21 MED ORDER — AMLODIPINE BESYLATE 10 MG PO TABS
10.0000 mg | ORAL_TABLET | Freq: Every day | ORAL | Status: DC
  Administered 2023-10-21 – 2023-10-26 (×4): 10 mg via ORAL
  Filled 2023-10-21 (×3): qty 1
  Filled 2023-10-21: qty 2
  Filled 2023-10-21: qty 1

## 2023-10-21 MED ORDER — POTASSIUM CHLORIDE 10 MEQ/100ML IV SOLN
10.0000 meq | INTRAVENOUS | Status: AC
  Administered 2023-10-21 (×2): 10 meq via INTRAVENOUS
  Filled 2023-10-21 (×2): qty 100

## 2023-10-21 MED ORDER — MAGNESIUM HYDROXIDE 400 MG/5ML PO SUSP: 30.0000 mL | Freq: Every day | ORAL | Status: DC | PRN

## 2023-10-21 MED ORDER — PANCRELIPASE (LIP-PROT-AMYL) 36000-114000 UNITS PO CPEP
36000.0000 [IU] | ORAL_CAPSULE | Freq: Three times a day (TID) | ORAL | Status: DC
Start: 2023-10-21 — End: 2023-10-26
  Administered 2023-10-24 – 2023-10-25 (×6): 36000 [IU] via ORAL
  Filled 2023-10-21 (×16): qty 1

## 2023-10-21 MED ORDER — PANTOPRAZOLE SODIUM 40 MG IV SOLR
40.0000 mg | Freq: Once | INTRAVENOUS | Status: AC
  Administered 2023-10-21: 40 mg via INTRAVENOUS
  Filled 2023-10-21: qty 10

## 2023-10-21 MED ORDER — HYDROMORPHONE HCL 1 MG/ML IJ SOLN
2.0000 mg | INTRAMUSCULAR | Status: DC | PRN
  Administered 2023-10-21 (×2): 2 mg via INTRAVENOUS
  Filled 2023-10-21 (×3): qty 2

## 2023-10-21 MED ORDER — OMEGA-3-ACID ETHYL ESTERS 1 G PO CAPS
1.0000 g | ORAL_CAPSULE | Freq: Two times a day (BID) | ORAL | Status: DC
Start: 2023-10-21 — End: 2023-10-26
  Administered 2023-10-25: 1 g via ORAL
  Filled 2023-10-21 (×7): qty 1

## 2023-10-21 MED ORDER — PANTOPRAZOLE SODIUM 40 MG PO TBEC
40.0000 mg | DELAYED_RELEASE_TABLET | Freq: Every day | ORAL | Status: DC
  Administered 2023-10-21 – 2023-10-26 (×5): 40 mg via ORAL
  Filled 2023-10-21 (×5): qty 1

## 2023-10-21 MED ORDER — NALOXONE HCL 4 MG/0.1ML NA LIQD: 1.0000 | Freq: Once | NASAL | Status: DC | PRN

## 2023-10-21 MED ORDER — HYDROMORPHONE HCL 1 MG/ML IJ SOLN
2.0000 mg | Freq: Once | INTRAMUSCULAR | Status: AC
  Administered 2023-10-21: 2 mg via INTRAVENOUS
  Filled 2023-10-21: qty 2

## 2023-10-21 MED ORDER — TRAZODONE HCL 100 MG PO TABS
100.0000 mg | ORAL_TABLET | Freq: Every day | ORAL | Status: DC
  Administered 2023-10-21 – 2023-10-25 (×5): 100 mg via ORAL
  Filled 2023-10-21 (×5): qty 1

## 2023-10-21 MED ORDER — BUPRENORPHINE HCL-NALOXONE HCL 8-2 MG SL SUBL: 1.0000 | SUBLINGUAL_TABLET | Freq: Three times a day (TID) | SUBLINGUAL | Status: DC

## 2023-10-21 MED ORDER — MORPHINE SULFATE (PF) 2 MG/ML IV SOLN
2.0000 mg | INTRAVENOUS | Status: DC | PRN
  Administered 2023-10-21 (×2): 2 mg via INTRAVENOUS
  Filled 2023-10-21 (×2): qty 1

## 2023-10-21 MED ORDER — HYDROMORPHONE HCL 1 MG/ML IJ SOLN
2.0000 mg | INTRAMUSCULAR | Status: DC | PRN
  Administered 2023-10-21 – 2023-10-22 (×3): 2 mg via INTRAVENOUS
  Filled 2023-10-21 (×3): qty 2

## 2023-10-21 MED ORDER — MAGNESIUM SULFATE 2 GM/50ML IV SOLN
2.0000 g | Freq: Once | INTRAVENOUS | Status: AC
Start: 2023-10-21 — End: 2023-10-21
  Administered 2023-10-21: 2 g via INTRAVENOUS
  Filled 2023-10-21: qty 50

## 2023-10-21 MED ORDER — ALBUTEROL SULFATE (2.5 MG/3ML) 0.083% IN NEBU: 2.5000 mg | INHALATION_SOLUTION | Freq: Four times a day (QID) | RESPIRATORY_TRACT | Status: DC | PRN

## 2023-10-21 MED ORDER — ACETAMINOPHEN 650 MG RE SUPP: 650.0000 mg | Freq: Four times a day (QID) | RECTAL | Status: DC | PRN

## 2023-10-21 MED ORDER — LOSARTAN POTASSIUM 50 MG PO TABS
50.0000 mg | ORAL_TABLET | Freq: Every day | ORAL | Status: DC
  Administered 2023-10-21 – 2023-10-26 (×4): 50 mg via ORAL
  Filled 2023-10-21 (×5): qty 1

## 2023-10-21 MED ORDER — WARFARIN - PHARMACIST DOSING INPATIENT
Freq: Every day | Status: DC
  Filled 2023-10-21: qty 1

## 2023-10-21 MED ORDER — SPIRONOLACTONE 25 MG PO TABS
25.0000 mg | ORAL_TABLET | Freq: Every day | ORAL | Status: DC
  Administered 2023-10-21 – 2023-10-26 (×5): 25 mg via ORAL
  Filled 2023-10-21 (×5): qty 1

## 2023-10-21 MED ORDER — WARFARIN SODIUM 1 MG PO TABS: 1.0000 mg | ORAL_TABLET | Freq: Once | ORAL | Status: DC

## 2023-10-21 MED ORDER — ONDANSETRON HCL 4 MG/2ML IJ SOLN
4.0000 mg | Freq: Once | INTRAMUSCULAR | Status: AC
  Administered 2023-10-21: 4 mg via INTRAVENOUS
  Filled 2023-10-21: qty 2

## 2023-10-21 MED ORDER — WARFARIN SODIUM 3 MG PO TABS: 3.2500 mg | ORAL_TABLET | Freq: Every day | ORAL | Status: DC

## 2023-10-21 MED ORDER — POTASSIUM CHLORIDE IN NACL 20-0.9 MEQ/L-% IV SOLN
INTRAVENOUS | Status: AC
  Filled 2023-10-21 (×3): qty 1000

## 2023-10-21 MED ORDER — ACETAMINOPHEN 325 MG PO TABS: 650.0000 mg | ORAL_TABLET | Freq: Four times a day (QID) | ORAL | Status: DC | PRN

## 2023-10-21 NOTE — Assessment & Plan Note (Addendum)
-   The patient will be admitted to a medical telemetry observation bed. - Pain management will be provided. - Will keep her n.p.o. except for medications. - Will follow lipase levels. - Will continue Creon.

## 2023-10-21 NOTE — Consult Note (Signed)
 PHARMACY - ANTICOAGULATION CONSULT NOTE  Pharmacy Consult for Warfarin Indication:  On-X mechanical aortic valve ( INR goal 1.5-2 )  Allergies  Allergen Reactions   Latex Anaphylaxis, Hives and Itching    Other reaction(s): Other (See Comments) SKIN BURNING & PEELING    Nsaids Other (See Comments)    Serve ulcers; stomach aches, GERD.  Other Reaction(s): Other (See Comments)   Tomato Anaphylaxis, Hives and Itching   Gabapentin Palpitations    Has heart condition; also makes jittery   Metformin     Other Reaction(s): Abdominal Pain  Causes pancreatitis to worsen   Lisinopril     Other Reaction(s): Angioedema   Tramadol Itching and Hives   Duloxetine Anxiety    Makes her jittery    Patient Measurements:    Vital Signs: Temp: 98.1 F (36.7 C) (04/06 0544) Temp Source: Oral (04/06 0544) BP: 158/89 (04/06 0800) Pulse Rate: 86 (04/06 0800)  Labs: Recent Labs    10/20/23 2359 10/21/23 0044 10/21/23 0504  HGB 14.3  --  13.4  HCT 42.4  --  39.4  PLT 295  --  249  LABPROT  --  35.6*  --   INR  --  3.5*  --   CREATININE 0.58  --  0.47  TROPONINIHS 60*  --   --     Estimated Creatinine Clearance: 121.3 mL/min (by C-G formula based on SCr of 0.47 mg/dL).   Medical History: Past Medical History:  Diagnosis Date   Arthritis    Asthma    Chronic pain disorder 03/19/2018   GERD (gastroesophageal reflux disease)    Headache    Heart murmur    History of trichomoniasis 03/19/2018   Hypertension    Intractable nausea and vomiting    Pancreatitis    PID (pelvic inflammatory disease) 02/14/2018   Uterine leiomyoma 03/19/2018    Medications:  (Not in a hospital admission)  Scheduled:   amLODipine  10 mg Oral Daily   atorvastatin  40 mg Oral Daily   insulin aspart  0-9 Units Subcutaneous Q4H   lipase/protease/amylase  36,000 Units Oral TID WC   losartan  50 mg Oral Daily   methadone  10 mg Oral QID   omega-3 acid ethyl esters  1 g Oral BID   pantoprazole   40 mg Oral Daily   spironolactone  25 mg Oral Daily   traZODone  100 mg Oral QHS   Warfarin - Pharmacist Dosing Inpatient   Does not apply q1600   Infusions:   0.9 % NaCl with KCl 20 mEq / L 100 mL/hr at 10/21/23 0408   PRN: acetaminophen **OR** acetaminophen, albuterol, HYDROcodone-acetaminophen, HYDROmorphone (DILAUDID) injection, magnesium hydroxide, morphine injection, naloxone, ondansetron **OR** ondansetron (ZOFRAN) IV Anti-infectives (From admission, onward)    None       Assessment: 43 y.o. 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 CBC stable.   Last seen at [facility] anticoagulation clinic on 09/28/23 with subtherapeutic INR of 1.1 which was below goal of 1.5-2 due to held doses of warfarin during a recent admission. At that visit she was advised to continue 7.5 mg on Thursdays and 3.75 mg all other days (TWD 30 mg).   On 10/19/23 pt was seen  at [facility] anticoagulation clinic and INR 5.1 which is above goal of 1.5 to 2 due to recent decrease in appetite after resuming Ozempic. Their plan:  1. Hold warfarin for 2 days then decrease to 3.75 mg daily (TWD 26.25 mg)  Date INR Warfarin Dose  4/6 3.5 HOLD        Goal of Therapy:  INR 1.5 - 2 Monitor platelets by anticoagulation protocol: Yes   Plan:  INR is supratherapeutic. Will hold warfarin. Daily INR. CBC at least every 3 days.   Ronnald Ramp, PharmD, BCPS 10/21/2023,10:31 AM

## 2023-10-21 NOTE — ED Notes (Signed)
 Pt refused insulin and suboxone. Pt states she does not take insulin at home and Suboxone does not work for her. Dr. Rennis Chris notified.

## 2023-10-21 NOTE — Assessment & Plan Note (Signed)
-   We will continue Coumadin. - Will follow INR.

## 2023-10-21 NOTE — Assessment & Plan Note (Signed)
-   The will be placed on supplemental coverage with [employer].

## 2023-10-21 NOTE — Assessment & Plan Note (Addendum)
-   We will continue Suboxone and methadone.

## 2023-10-21 NOTE — H&P (Addendum)
 [place]   PATIENT NAME: Sarah Sosa    MR#:  [id number]  DATE OF BIRTH:  10-17-1980  DATE OF ADMISSION:  10/21/2023  PRIMARY CARE PHYSICIAN: [facility]   Patient is coming from: Home  REQUESTING/REFERRING PHYSICIAN: Ward, Layla Maw, DO  CHIEF COMPLAINT:   Chief Complaint  Patient presents with   Abdominal Pain    HISTORY OF PRESENT ILLNESS:  Sarah Sosa is a 43 y.o. 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  She denied any bilious vomitus or hematemesis.  She admits to diarrhea as well as low-grade fever of 100 without chills.  No dysuria, oliguria or hematuria or flank pain.  No chest pain or palpitations.  No bleeding diathesis.  ED Course: When the patient came to the ER, BP was 175/105 with otherwise normal vital signs.  Labs revealed mild hypokalemia of 3.4 and hypercalcemia of 8.7 with otherwise unremarkable CMP.  Serum lipase level was 234 with total protein of 8.2 and magnesium was 1.5.  High sensitive troponin I was 60.  CBC was normal.  PT was 35.6 and INR 3.5 on Coumadin.  Alcohol level was 134. EKG as reviewed by me : EKG showed sinus rhythm with rate of 96 with Q waves anteroseptally, LVH by voltage criteria. Imaging: None.  The patient was given 40 mg IV Protonix, 4 mg IV Zofran twice, 10 mill Cabbell IV potassium chloride and 1 L bolus of IV normal saline, Dilaudid 2 mg IV twice and hydration with IV normal saline at per hour.  The patient will be admitted to a medical telemetry observation bed for further evaluation and management. PAST MEDICAL HISTORY:   Past Medical History:   Diagnosis Date   Arthritis    Asthma    Chronic pain disorder 03/19/2018   GERD (gastroesophageal reflux disease)    Headache    Heart murmur    History of trichomoniasis 03/19/2018   Hypertension    Intractable nausea and vomiting    Pancreatitis    PID (pelvic inflammatory disease) 02/14/2018   Uterine leiomyoma 03/19/2018  -Chronic anticoagulation with Coumadin-post AVR  PAST SURGICAL HISTORY:   Past Surgical History:  Procedure Laterality Date   ABDOMINAL HYSTERECTOMY     AORTIC VALVE REPLACEMENT  03/2022   CHOLECYSTECTOMY     DENTAL SURGERY     HYSTERECTOMY ABDOMINAL WITH SALPINGECTOMY Bilateral 04/15/2018   Procedure: HYSTERECTOMY ABDOMINAL WITH BILATERAL SALPINGECTOMY;  Surgeon: Hildred Laser, MD;  Location: [facility] ORS;  Service: Gynecology;  Laterality: Bilateral;   kenn surgery Bilateral    KNEE ARTHROSCOPY Left 2012, 2013    SOCIAL HISTORY:   Social History   Tobacco Use   Smoking status: Some Days    Current packs/day: 0.50    Average packs/day: 0.5 packs/day for 22.0 years (11.0 ttl pk-yrs)    Types: Cigarettes   Smokeless tobacco: Never  Substance Use Topics   Alcohol use: Not Currently    Comment: occasional    FAMILY HISTORY:   Family History  Problem Relation Age of Onset   Fibroids Mother    Hypertension Mother    Arthritis Mother     DRUG ALLERGIES:   Allergies  Allergen Reactions  Latex Anaphylaxis, Hives and Itching    Other reaction(s): Other (See Comments) SKIN BURNING & PEELING    Nsaids Other (See Comments)    Serve ulcers; stomach aches, GERD.  Other Reaction(s): Other (See Comments)   Tomato Anaphylaxis, Hives and Itching   Gabapentin Palpitations    Has heart condition; also makes jittery   Metformin     Other Reaction(s): Abdominal Pain  Causes pancreatitis to worsen   Lisinopril     Other Reaction(s): Angioedema   Tramadol Itching and Hives   Duloxetine Anxiety    Makes her jittery    REVIEW OF SYSTEMS:    ROS As per history of present illness. All pertinent systems were reviewed above. Constitutional, HEENT, cardiovascular, respiratory, GI, GU, musculoskeletal, neuro, psychiatric, endocrine, integumentary and hematologic systems were reviewed and are otherwise negative/unremarkable except for positive findings mentioned above in the HPI.   MEDICATIONS AT HOME:   Prior to Admission medications   Medication Sig Start Date End Date Taking? Authorizing Provider  albuterol (VENTOLIN HFA) 108 (90 Base) MCG/ACT inhaler Inhale 1-2 puffs into the lungs every 6 (six) hours as needed. 07/26/23   [provider]  amitriptyline (ELAVIL) 10 MG tablet Take 10 mg by mouth 3 (three) times daily as needed. 11/02/22   [provider]  amLODipine (NORVASC) 10 MG tablet Take 1 tablet (10 mg total) by mouth daily. 07/19/23   Marcelino Duster, MD  APPLE CIDER VINEGAR PO Take 1 capsule by mouth 3 (three) times daily.    [provider]  atorvastatin (LIPITOR) 40 MG tablet Take 40 mg by mouth daily. 10/17/22   [provider]  HYDROcodone-acetaminophen (NORCO/VICODIN) 5-325 MG tablet Take 1 tablet by mouth every 4 (four) hours as needed. 10/11/23   Minna Antis, MD  lipase/protease/amylase (CREON) 36000 UNITS CPEP capsule Take 1 capsule (36,000 Units total) by mouth 3 (three) times daily with meals. 08/13/23   Sreenath, Jonelle Sports, MD  losartan (COZAAR) 50 MG tablet Take 1 tablet (50 mg total) by mouth daily. 07/19/23   Marcelino Duster, MD  metFORMIN (GLUCOPHAGE) 500 MG tablet Take 500 mg by mouth every morning. 10/17/22   [provider]  methadone (DOLOPHINE) 10 MG tablet Take 10 mg by mouth 4 (four) times daily. 03/14/23   [provider]  naloxone [facility]) nasal spray 4 mg/0.1 mL Place 1 spray into the nose once as needed (to reverse overdose). 02/24/23   [provider]  omega-3 acid ethyl esters (LOVAZA) 1 g capsule Take 1 g by mouth 2 (two) times  daily.    [provider]  omeprazole (PRILOSEC) 40 MG capsule Take 40 mg by mouth daily. 02/23/23   [provider]  ondansetron (ZOFRAN) 4 MG tablet Take 1 tablet (4 mg total) by mouth daily as needed for nausea or vomiting. 08/13/23 08/12/24  Tresa Moore, MD  ondansetron (ZOFRAN-ODT) 4 MG disintegrating tablet Take 1 tablet (4 mg total) by mouth every 8 (eight) hours as needed for nausea or vomiting. 10/11/23   Minna Antis, MD  spironolactone (ALDACTONE) 25 MG tablet Take 25 mg by mouth daily. 02/03/22   [provider]  traZODone (DESYREL) 50 MG tablet Take 100 mg by mouth at bedtime. 09/07/21   [provider]  warfarin (COUMADIN) 7.5 MG tablet Take 3.25-7.5 mg by mouth daily. Take 3.25mg   M/W/F/S/Sun and T/Th 7.5mg  (1 tab)    [provider]      VITAL SIGNS:  Blood pressure (!) 140/79, pulse 93,  temperature 98.6 F (37 C), resp. rate 16, last menstrual period 03/25/2018, SpO2 90%.  PHYSICAL EXAMINATION:  Physical Exam  GENERAL:  43 y.o.-year-old African-American female patient lying in the bed with no acute distress.  EYES: Pupils equal, round, reactive to light and accommodation. No scleral icterus. Extraocular muscles intact.  HEENT: Head atraumatic, normocephalic. Oropharynx and nasopharynx clear.  NECK:  Supple, no jugular venous distention. No thyroid enlargement, no tenderness.  LUNGS: Normal breath sounds bilaterally, no wheezing, rales,rhonchi or crepitation. No use of accessory muscles of respiration.  CARDIOVASCULAR: Regular rate and rhythm, S1, S2 normal. No murmurs, rubs, or gallops.  ABDOMEN: Alcohol EXTREMITIES: No pedal edema, cyanosis, or clubbing.  NEUROLOGIC: Cranial nerves II through XII are intact. Muscle strength 5/5 in all extremities. Sensation intact. Gait not checked.  PSYCHIATRIC: The patient is alert and oriented x 3.  Normal affect and good eye contact. SKIN: No obvious rash, lesion, or ulcer.    LABORATORY PANEL:   CBC Recent Labs  Lab 10/20/23 2359  WBC 7.3  HGB 14.3  HCT 42.4  PLT 295   ------------------------------------------------------------------------------------------------------------------  Chemistries  Recent Labs  Lab 10/20/23 2359  NA 140  K 3.4*  CL 103  CO2 24  GLUCOSE 94  BUN 5*  CREATININE 0.58  CALCIUM 8.7*  MG 1.5*  AST 26  ALT 24  ALKPHOS 89  BILITOT 0.5   ------------------------------------------------------------------------------------------------------------------  Cardiac Enzymes No results for input(s): "TROPONINI" in the last 168 hours. ------------------------------------------------------------------------------------------------------------------  RADIOLOGY:  No results found.    IMPRESSION AND PLAN:  Assessment and Plan: * Acute on chronic pancreatitis [facility]) - The patient will be admitted to a medical telemetry observation bed. - Pain management will be provided. - Will keep her n.p.o. except for medications. - Will follow lipase levels. - Will continue Creon.  Alcohol intoxication (HCC) - She will be monitored for alcohol withdrawal. - It is likely the culprit for her suspected gastritis. - We will continue PPI therapy.  Hypokalemia - This is associated with hypomagnesemia. - Potassium and magnesium will be replaced.  Essential hypertension - We will continue antihypertensive therapy.  Dyslipidemia - We will continue statin therapy.  Status post aortic valve replacement with metallic valve - We will continue Coumadin. - Will follow INR.  Type 2 diabetes mellitus without complications (HCC) - The will be placed on supplemental coverage with NovoLog.  Chronic pain syndrome - We will continue Suboxone and methadone.    DVT prophylaxis: Coumadin.. Advanced Care Planning:  Code Status: full code.  Family Communication:  The plan of care was discussed in details with the patient (and family). I  answered all questions. The patient agreed to proceed with the above mentioned plan. Further management will depend upon hospital course. Disposition Plan: Back to previous home environment Consults called: none. All the records are reviewed and case discussed with ED provider.  Status is: Observation  I certify that at the time of admission, it is my clinical judgment that the patient will require  hospital care extending less than 2 midnights.                            Dispo: The patient is from: Home              Anticipated d/c is to: Home              Patient currently is not medically stable to d/c.  Difficult to place patient: No  Hannah Beat M.D on 10/21/2023 at 5:13 AM  [facility]   From 7 PM-7 AM, contact night-coverage [email or web address]  CC: Primary care physician; [facility]

## 2023-10-21 NOTE — ED Notes (Signed)
 Patient requesting a higher dose of pain medication as well as an increase in frequency. Mansy, MD notified.

## 2023-10-21 NOTE — Assessment & Plan Note (Signed)
-   She will be monitored for alcohol withdrawal. - It is likely the culprit for her suspected gastritis. - We will continue PPI therapy.

## 2023-10-21 NOTE — ED Notes (Signed)
 Pt refusing CBG monitoring at this time and states "I dont check my sugar at home"

## 2023-10-21 NOTE — Assessment & Plan Note (Signed)
-   We will continue antihypertensive therapy.

## 2023-10-21 NOTE — Progress Notes (Signed)
 PROGRESS NOTE    Sarah Sosa  EAV:[id number] DOB: [birth year] DOA: 10/21/2023 PCP: [facility]  Chief Complaint  Patient presents with   Abdominal Pain    Hospital Course:  Zendaya Sarah Sosa is 43 y.o. female with asthma, GERD, osteoarthritis, chronic pain, status post aortic valve replacement mechanical prosthetic valve on Coumadin, hypertension, chronic pancreatitis, who presents to the ED with acute onset epigastric and left upper quadrant pain with associated nausea and vomiting.  In the ED blood pressure was 175/105 she had mild hypokalemia, lipase level 234, high-sensitivity troponin 60.  Alcohol level is 134.  Patient received Protonix, Zofran, potassium supplementation, Dilaudid.  She was admitted for acute on chronic pancreatitis  Subjective: Patient reports her pain is still uncontrolled. She's requesting increased dilaudid. She reports she is not taking methadone at home, suboxone only.    Objective: Vitals:   10/21/23 0430 10/21/23 0500 10/21/23 0544 10/21/23 0800  BP: (!) 148/88 138/88  (!) 158/89  Pulse:  90  86  Resp: 15 (!) 22  11  Temp:   98.1 F (36.7 C)   TempSrc:   Oral   SpO2:  95%  96%    Intake/Output Summary (Last 24 hours) at 10/21/2023 0912 Last data filed at 10/21/2023 0131 Gross per 24 hour  Intake 1000 ml  Output --  Net 1000 ml   There were no vitals filed for this visit.  Examination: General exam: Appears calm and comfortable, NAD  Respiratory system: No work of breathing, symmetric chest wall expansion Cardiovascular system: S1 & S2 heard, RRR.  Gastrointestinal system: Abdomen is nondistended, diffusely tender to palpation. Neuro: Alert and oriented. No focal neurological deficits. Extremities: Symmetric, expected ROM Skin: Flushed Psychiatry: Demonstrates appropriate judgement and insight. Mood & affect appropriate for situation.   Assessment & Plan:  Principal Problem:   Acute on chronic pancreatitis (HCC) Active Problems:    Alcohol intoxication (HCC)   Hypokalemia   Essential hypertension   Dyslipidemia   Status post aortic valve replacement with metallic valve   Chronic pain syndrome   Type 2 diabetes mellitus without complications (HCC)    Acute on chronic pancreatitis  -Patient reports chronic pancreatitis secondary to Ozempic.  She reports that she is still taking this medication.  She is also drinking alcohol.  She was acutely intoxicated on arrival. - Currently requiring IV Dilaudid for pain control.  Titrate as tolerated -Will continue with n.p.o., IV fluids - Is chronically on Creon, will continue for now  Alcohol intoxication - Acutely intoxicated on arrival - Continue PPI - Will need to monitor for alcohol withdrawal, CIWA protocol - As needed Ativan - Discussed the importance of alcohol abstinence especially in setting of pancreatitis  Hypokalemia Hypomagnesemia - Replace as needed  Hypertension - Continue antihypertensives, titrate as needed  Dyslipidemia - Continue statin  Status post aortic valve replacement with mechanical valve - Continue Coumadin - Follow INR, pharmacy to assist in dosing  Type 2 diabetes without complications - Currently n.p.o., continue sliding scale insulin for now - Hold oral antihyperglycemics - Recommend discontinuation of Ozempic entirely given recurrent pancreatitis.  Discussed this directly with the patient.  Chronic pain syndrome --Patient is no longer on methadone.  Will discontinue. - Initiate Suboxone 3 times daily consistent with home dose - Currently requiring IV Dilaudid for pain control.  Will continue to taper this as tolerated  GERD - PPI as above   DVT prophylaxis: Home dose warfarin   Code Status: Full Code Family Communication:  Discussed  directly with patient Disposition: Make inpatient as she is currently requiring IV pain medication and IV fluids.      Antimicrobials:  Anti-infectives (From admission, onward)    None        Data Reviewed: I have personally reviewed following labs and imaging studies CBC: Recent Labs  Lab 10/20/23 2359 10/21/23 0504  WBC 7.3 7.4  HGB 14.3 13.4  HCT 42.4 39.4  MCV 89.6 90.0  PLT 295 249   Basic Metabolic Panel: Recent Labs  Lab 10/20/23 2359 10/21/23 0504  NA 140 134*  K 3.4* 4.3  CL 103 102  CO2 24 22  GLUCOSE 94 90  BUN 5* <5*  CREATININE 0.58 0.47  CALCIUM 8.7* 8.0*  MG 1.5*  --    GFR: Estimated Creatinine Clearance: 121.3 mL/min (by C-G formula based on SCr of 0.47 mg/dL). Liver Function Tests: Recent Labs  Lab 10/20/23 2359  AST 26  ALT 24  ALKPHOS 89  BILITOT 0.5  PROT 8.2*  ALBUMIN 4.2   CBG: Recent Labs  Lab 10/21/23 0608 10/21/23 0813  GLUCAP 86 102*    No results found for this or any previous visit (from the past 240 hours).   Radiology Studies: No results found.  Scheduled Meds:  amLODipine  10 mg Oral Daily   atorvastatin  40 mg Oral Daily   insulin aspart  0-9 Units Subcutaneous Q4H   lipase/protease/amylase  36,000 Units Oral TID WC   losartan  50 mg Oral Daily   methadone  10 mg Oral QID   omega-3 acid ethyl esters  1 g Oral BID   pantoprazole  40 mg Oral Daily   spironolactone  25 mg Oral Daily   traZODone  100 mg Oral QHS   Warfarin - Pharmacist Dosing Inpatient   Does not apply q1600   Continuous Infusions:  0.9 % NaCl with KCl 20 mEq / L 100 mL/hr at 10/21/23 0408     LOS: 0 days  MDM: Patient is high risk for one or more organ failure.  They necessitate ongoing hospitalization for continued IV therapies and subsequent lab monitoring. Total time spent interpreting labs and vitals, coordinating care amongst consultants and care team members, directly assessing and discussing care with the patient and/or family: 55 min    Debarah Crape, DO [facility]  To contact the attending physician between 7A-7P please use Epic Chat. To contact the covering physician during after hours 7P-7A, please  review Amion.   10/21/2023, 9:12 AM   *This document has been created with the assistance of dictation software. Please excuse typographical errors. *

## 2023-10-21 NOTE — Assessment & Plan Note (Signed)
 -  We will continue statin therapy.

## 2023-10-21 NOTE — ED Notes (Signed)
 Patient is requesting Dilaudid instead of Fentanyl stating that she had some kind of "reaction" to fentanyl before and she knows dilaudid works for her. Mansy, MD notified.

## 2023-10-21 NOTE — Assessment & Plan Note (Signed)
-   This is associated with hypomagnesemia. - Potassium and magnesium will be replaced.

## 2023-10-21 NOTE — ED Provider Notes (Addendum)
 [facility] Provider Note    Event Date/Time   First MD Initiated Contact with Patient 10/21/23 0020     (approximate)   History   Abdominal Pain   HPI  Sarah Sosa is a 43 y.o. female with history of alcohol induced pancreatitis, chronic abdominal pain on chronic narcotics, hypertension, aortic valve replacement on Coumadin who presents to the emergency department complaints of left upper abdominal pain, vomiting today.  Also reports diarrhea.  No chest pain or shortness of breath.  No fever.  No urinary symptoms.  Symptoms feel similar to prior episodes of pancreatitis.  Patient has had previous partial hysterectomy and salpingectomy, cholecystectomy.   History provided by patient.    Past Medical History:  Diagnosis Date   Arthritis    Asthma    Chronic pain disorder 03/19/2018   GERD (gastroesophageal reflux disease)    Headache    Heart murmur    History of trichomoniasis 03/19/2018   Hypertension    Intractable nausea and vomiting    Pancreatitis    PID (pelvic inflammatory disease) 02/14/2018   Uterine leiomyoma 03/19/2018    Past Surgical History:  Procedure Laterality Date   ABDOMINAL HYSTERECTOMY     AORTIC VALVE REPLACEMENT  03/2022   CHOLECYSTECTOMY     DENTAL SURGERY     HYSTERECTOMY ABDOMINAL WITH SALPINGECTOMY Bilateral 04/15/2018   Procedure: HYSTERECTOMY ABDOMINAL WITH BILATERAL SALPINGECTOMY;  Surgeon: Hildred Laser, MD;  Location: [facility] ORS;  Service: Gynecology;  Laterality: Bilateral;   kenn surgery Bilateral    KNEE ARTHROSCOPY Left 2012, 2013    MEDICATIONS:  Prior to Admission medications   Medication Sig Start Date End Date Taking? Authorizing Provider  albuterol (VENTOLIN HFA) 108 (90 Base) MCG/ACT inhaler Inhale 1-2 puffs into the lungs every 6 (six) hours as needed. 07/26/23   [provider]  amitriptyline (ELAVIL) 10 MG tablet Take 10 mg by mouth 3 (three) times daily as needed. 11/02/22   [provider]  amLODipine (NORVASC) 10 MG tablet Take 1 tablet (10 mg total) by mouth daily. 07/19/23   Marcelino Duster, MD  APPLE CIDER VINEGAR PO Take 1 capsule by mouth 3 (three) times daily.    [provider]  atorvastatin (LIPITOR) 40 MG tablet Take 40 mg by mouth daily. 10/17/22   [provider]  HYDROcodone-acetaminophen (NORCO/VICODIN) 5-325 MG tablet Take 1 tablet by mouth every 4 (four) hours as needed. 10/11/23   Minna Antis, MD  lipase/protease/amylase (CREON) 36000 UNITS CPEP capsule Take 1 capsule (36,000 Units total) by mouth 3 (three) times daily with meals. 08/13/23   Sreenath, Jonelle Sports, MD  losartan (COZAAR) 50 MG tablet Take 1 tablet (50 mg total) by mouth daily. 07/19/23   Marcelino Duster, MD  metFORMIN (GLUCOPHAGE) 500 MG tablet Take 500 mg by mouth every morning. 10/17/22   [provider]  methadone (DOLOPHINE) 10 MG tablet Take 10 mg by mouth 4 (four) times daily. 03/14/23   [provider]  naloxone [facility]) nasal spray 4 mg/0.1 mL Place 1 spray into the nose once as needed (to reverse overdose). 02/24/23   [provider]  omega-3 acid ethyl esters (LOVAZA) 1 g capsule Take 1 g by mouth 2 (two) times daily.    [provider]  omeprazole (PRILOSEC) 40 MG capsule Take 40 mg by mouth daily. 02/23/23   [provider]  ondansetron (ZOFRAN) 4 MG tablet Take 1 tablet (4 mg total) by mouth daily as needed for nausea  or vomiting. 08/13/23 08/12/24  Tresa Moore, MD  ondansetron (ZOFRAN-ODT) 4 MG disintegrating tablet Take 1 tablet (4 mg total) by mouth every 8 (eight) hours as needed for nausea or vomiting. 10/11/23   Minna Antis, MD  spironolactone (ALDACTONE) 25 MG tablet Take 25 mg by mouth daily. 02/03/22   [provider]  traZODone (DESYREL) 50 MG tablet Take 100 mg by mouth at bedtime. 09/07/21   [provider]  warfarin (COUMADIN) 7.5 MG tablet Take 3.25-7.5 mg by mouth  daily. Take 3.25mg   M/W/F/S/Sun and T/Th 7.5mg  (1 tab)    [provider]    Physical Exam   Triage Vital Signs: ED Triage Vitals  Encounter Vitals Group     BP 10/20/23 2319 (!) 171/105     Systolic BP Percentile --      Diastolic BP Percentile --      Pulse Rate 10/20/23 2319 100     Resp 10/20/23 2319 19     Temp 10/20/23 2319 98.6 F (37 C)     Temp src --      SpO2 10/20/23 2319 100 %     Weight --      Height --      Head Circumference --      Peak Flow --      Pain Score 10/20/23 2326 10     Pain Loc --      Pain Education --      Exclude from Growth Chart --     Most recent vital signs: Vitals:   10/21/23 0500 10/21/23 0544  BP: 138/88   Pulse: 90   Resp: (!) 22   Temp:  98.1 F (36.7 C)  SpO2: 95%     CONSTITUTIONAL: Alert, responds appropriately to questions.  Appears uncomfortable HEAD: Normocephalic, atraumatic EYES: Conjunctivae clear, pupils appear equal, sclera nonicteric ENT: normal nose; moist mucous membranes NECK: Supple, normal ROM CARD: RRR; S1 and S2 appreciated RESP: Normal chest excursion without splinting or tachypnea; breath sounds clear and equal bilaterally; no wheezes, no rhonchi, no rales, no hypoxia or respiratory distress, speaking full sentences ABD/GI: Non-distended; soft, tender throughout the upper abdomen, no guarding or rebound, no tenderness at McBurney's point BACK: The back appears normal EXT: Normal ROM in all joints; no deformity noted, no edema SKIN: Normal color for age and race; warm; no rash on exposed skin NEURO: Moves all extremities equally, normal speech PSYCH: The patient's mood and manner are appropriate.   ED Results / Procedures / Treatments   LABS: (all labs ordered are listed, but only abnormal results are displayed) Labs Reviewed  LIPASE, BLOOD - Abnormal; Notable for the following components:      Result Value   Lipase 234 (*)    All other components within normal limits  COMPREHENSIVE  METABOLIC PANEL WITH GFR - Abnormal; Notable for the following components:   Potassium 3.4 (*)    BUN 5 (*)    Calcium 8.7 (*)    Total Protein 8.2 (*)    All other components within normal limits  PROTIME-INR - Abnormal; Notable for the following components:   Prothrombin Time 35.6 (*)    INR 3.5 (*)    All other components within normal limits  ETHANOL - Abnormal; Notable for the following components:   Alcohol, Ethyl (B) 134 (*)    All other components within normal limits  MAGNESIUM - Abnormal; Notable for the following components:   Magnesium 1.5 (*)    All  other components within normal limits  TROPONIN I (HIGH SENSITIVITY) - Abnormal; Notable for the following components:   Troponin I (High Sensitivity) 60 (*)    All other components within normal limits  CBC  CBC  URINALYSIS, ROUTINE W REFLEX MICROSCOPIC  BASIC METABOLIC PANEL WITH GFR  LIPASE, BLOOD     EKG:  EKG Interpretation Date/Time:  Sunday October 21 2023 00:42:18 EDT Ventricular Rate:  96 PR Interval:  176 QRS Duration:  95 QT Interval:  365 QTC Calculation: 462 R Axis:   77  Text Interpretation: Sinus rhythm Left atrial enlargement Left ventricular hypertrophy Anterior Q waves, possibly due to LVH Confirmed by Rochele Raring [phone]) on 10/21/2023 12:44:54 AM         RADIOLOGY: My personal review and interpretation of imaging:    I have personally reviewed all radiology reports.   No results found.   PROCEDURES:  Critical Care performed: No     Procedures    IMPRESSION / MDM / ASSESSMENT AND PLAN / ED COURSE  I reviewed the triage vital signs and the nursing notes.    Patient here with upper abdominal pain, vomiting.  History of alcohol induced pancreatitis.  The patient is on the cardiac monitor to evaluate for evidence of arrhythmia and/or significant heart rate changes.   DIFFERENTIAL DIAGNOSIS (includes but not limited to):   Pancreatitis, chronic pain, GERD, gastritis, viral  gastroenteritis, doubt appendicitis   Patient's presentation is most consistent with acute presentation with potential threat to life or bodily function.   PLAN: Will obtain labs, urine.  Will give IV fluids, pain and nausea medicine.  Will give Protonix.  Patient requesting 2 mg of IV Dilaudid and 4 mg of IV Zofran as she states this normally helps her symptoms.  Was seen here recently and had normal lipase and normal imaging of the abdomen.  Has had multiple previous CT scans.  I do not feel repeat CT scan indicated at this time.  When she does have pancreatitis, there are no complications seen including no necrosis or pseudocyst.   MEDICATIONS GIVEN IN ED: Medications  HYDROcodone-acetaminophen (NORCO/VICODIN) 5-325 MG per tablet 1 tablet (1 tablet Oral Given 10/21/23 0405)  methadone (DOLOPHINE) tablet 10 mg (has no administration in time range)  amLODipine (NORVASC) tablet 10 mg (has no administration in time range)  atorvastatin (LIPITOR) tablet 40 mg (has no administration in time range)  losartan (COZAAR) tablet 50 mg (has no administration in time range)  omega-3 acid ethyl esters (LOVAZA) capsule 1 g (has no administration in time range)  spironolactone (ALDACTONE) tablet 25 mg (has no administration in time range)  traZODone (DESYREL) tablet 100 mg (has no administration in time range)  lipase/protease/amylase (CREON) capsule 36,000 Units (has no administration in time range)  pantoprazole (PROTONIX) EC tablet 40 mg (has no administration in time range)  warfarin (COUMADIN) tablet 3-9 mg (has no administration in time range)  naloxone (NARCAN) nasal spray 4 mg/0.1 mL (has no administration in time range)  0.9 % NaCl with KCl 20 mEq/ L  infusion ( Intravenous New Bag/Given 10/21/23 0408)  acetaminophen (TYLENOL) tablet 650 mg (has no administration in time range)    Or  acetaminophen (TYLENOL) suppository 650 mg (has no administration in time range)  magnesium hydroxide (MILK OF  MAGNESIA) suspension 30 mL (has no administration in time range)  ondansetron (ZOFRAN) tablet 4 mg (has no administration in time range)    Or  ondansetron (ZOFRAN) injection 4 mg (has no administration  in time range)  albuterol (PROVENTIL) (2.5 MG/3ML) 0.083% nebulizer solution 2.5 mg (has no administration in time range)  Warfarin - Pharmacist Dosing Inpatient (has no administration in time range)  morphine (PF) 2 MG/ML injection 2 mg (has no administration in time range)  HYDROmorphone (DILAUDID) injection 1 mg (1 mg Intravenous Given 10/21/23 0501)  insulin aspart (novoLOG) injection 0-9 Units (has no administration in time range)  sodium chloride 0.9 % bolus 1,000 mL (1,000 mLs Intravenous New Bag/Given 10/21/23 0101)  HYDROmorphone (DILAUDID) injection 2 mg (2 mg Intravenous Given 10/21/23 0057)  ondansetron (ZOFRAN) injection 4 mg (4 mg Intravenous Given 10/21/23 0056)  pantoprazole (PROTONIX) injection 40 mg (40 mg Intravenous Given 10/21/23 0057)  HYDROmorphone (DILAUDID) injection 2 mg (2 mg Intravenous Given 10/21/23 0155)  ondansetron (ZOFRAN) injection 4 mg (4 mg Intravenous Given 10/21/23 0154)  magnesium sulfate IVPB 2 g 50 mL (0 g Intravenous Stopped 10/21/23 0320)  potassium chloride 10 mEq in 100 mL IVPB (0 mEq Intravenous Stopped 10/21/23 0409)     ED COURSE: Patient's lipase is elevated in the 200s.  Normal LFTs.  Her alcohol level is elevated which is likely the trigger for her recurrent pancreatitis.  Negative troponin.  Will continue hydration and discussed with hospitalist for admission.  Potassium and magnesium levels low likely from GI loss from vomiting.  Will give IV replacement of both.  She is getting IV hydration as well.  CONSULTS:  Consulted and discussed patient's case with hospitalist, Dr. Arville Care.  I have recommended admission and consulting physician agrees and will place admission orders.  Patient (and family if present) agree with this plan.   I reviewed all nursing  notes, vitals, pertinent previous records.  All labs, EKGs, imaging ordered have been independently reviewed and interpreted by myself.    OUTSIDE RECORDS REVIEWED: Reviewed prior admissions for pancreatitis.       FINAL CLINICAL IMPRESSION(S) / ED DIAGNOSES   Final diagnoses:  Acute on chronic pancreatitis (HCC)  Alcohol-induced acute pancreatitis without infection or necrosis  Hypokalemia  Hypomagnesemia     Rx / DC Orders   ED Discharge Orders     None        Note:  This document was prepared using Dragon voice recognition software and may include unintentional dictation errors.     Jazlynn Nemetz, Layla Maw, DO 10/21/23 [phone]

## 2023-10-22 DIAGNOSIS — J45909 Unspecified asthma, uncomplicated: Secondary | ICD-10-CM | POA: Diagnosis present

## 2023-10-22 DIAGNOSIS — E785 Hyperlipidemia, unspecified: Secondary | ICD-10-CM | POA: Diagnosis present

## 2023-10-22 DIAGNOSIS — Z9049 Acquired absence of other specified parts of digestive tract: Secondary | ICD-10-CM | POA: Diagnosis not present

## 2023-10-22 DIAGNOSIS — G894 Chronic pain syndrome: Secondary | ICD-10-CM | POA: Diagnosis present

## 2023-10-22 DIAGNOSIS — Z952 Presence of prosthetic heart valve: Secondary | ICD-10-CM | POA: Diagnosis not present

## 2023-10-22 DIAGNOSIS — M1990 Unspecified osteoarthritis, unspecified site: Secondary | ICD-10-CM | POA: Diagnosis present

## 2023-10-22 DIAGNOSIS — F1110 Opioid abuse, uncomplicated: Secondary | ICD-10-CM | POA: Diagnosis present

## 2023-10-22 DIAGNOSIS — Z886 Allergy status to analgesic agent status: Secondary | ICD-10-CM | POA: Diagnosis not present

## 2023-10-22 DIAGNOSIS — R1013 Epigastric pain: Secondary | ICD-10-CM | POA: Diagnosis present

## 2023-10-22 DIAGNOSIS — K861 Other chronic pancreatitis: Secondary | ICD-10-CM | POA: Diagnosis present

## 2023-10-22 DIAGNOSIS — K219 Gastro-esophageal reflux disease without esophagitis: Secondary | ICD-10-CM | POA: Diagnosis present

## 2023-10-22 DIAGNOSIS — Z8261 Family history of arthritis: Secondary | ICD-10-CM | POA: Diagnosis not present

## 2023-10-22 DIAGNOSIS — E119 Type 2 diabetes mellitus without complications: Secondary | ICD-10-CM

## 2023-10-22 DIAGNOSIS — Z7984 Long term (current) use of oral hypoglycemic drugs: Secondary | ICD-10-CM | POA: Diagnosis not present

## 2023-10-22 DIAGNOSIS — K853 Drug induced acute pancreatitis without necrosis or infection: Secondary | ICD-10-CM

## 2023-10-22 DIAGNOSIS — F17210 Nicotine dependence, cigarettes, uncomplicated: Secondary | ICD-10-CM | POA: Diagnosis present

## 2023-10-22 DIAGNOSIS — R011 Cardiac murmur, unspecified: Secondary | ICD-10-CM | POA: Diagnosis present

## 2023-10-22 DIAGNOSIS — E876 Hypokalemia: Secondary | ICD-10-CM | POA: Diagnosis present

## 2023-10-22 DIAGNOSIS — Z7901 Long term (current) use of anticoagulants: Secondary | ICD-10-CM | POA: Diagnosis not present

## 2023-10-22 DIAGNOSIS — Y906 Blood alcohol level of 120-199 mg/100 ml: Secondary | ICD-10-CM | POA: Diagnosis present

## 2023-10-22 DIAGNOSIS — Z90711 Acquired absence of uterus with remaining cervical stump: Secondary | ICD-10-CM | POA: Diagnosis not present

## 2023-10-22 DIAGNOSIS — F10920 Alcohol use, unspecified with intoxication, uncomplicated: Secondary | ICD-10-CM | POA: Diagnosis not present

## 2023-10-22 DIAGNOSIS — F10129 Alcohol abuse with intoxication, unspecified: Secondary | ICD-10-CM | POA: Diagnosis present

## 2023-10-22 DIAGNOSIS — K859 Acute pancreatitis without necrosis or infection, unspecified: Secondary | ICD-10-CM | POA: Diagnosis not present

## 2023-10-22 DIAGNOSIS — K852 Alcohol induced acute pancreatitis without necrosis or infection: Secondary | ICD-10-CM | POA: Diagnosis present

## 2023-10-22 DIAGNOSIS — I10 Essential (primary) hypertension: Secondary | ICD-10-CM | POA: Diagnosis present

## 2023-10-22 DIAGNOSIS — Z8249 Family history of ischemic heart disease and other diseases of the circulatory system: Secondary | ICD-10-CM | POA: Diagnosis not present

## 2023-10-22 LAB — URINALYSIS, ROUTINE W REFLEX MICROSCOPIC
Bilirubin Urine: NEGATIVE
Glucose, UA: NEGATIVE mg/dL
Ketones, ur: NEGATIVE mg/dL
Leukocytes,Ua: NEGATIVE
Nitrite: NEGATIVE
Protein, ur: NEGATIVE mg/dL
Specific Gravity, Urine: 1.01 (ref 1.005–1.030)
pH: 6 (ref 5.0–8.0)

## 2023-10-22 LAB — COMPREHENSIVE METABOLIC PANEL WITH GFR
ALT: 15 U/L (ref 0–44)
AST: 17 U/L (ref 15–41)
Albumin: 3.3 g/dL — ABNORMAL LOW (ref 3.5–5.0)
Alkaline Phosphatase: 74 U/L (ref 38–126)
Anion gap: 5 (ref 5–15)
BUN: <5 mg/dL — ABNORMAL LOW (ref 6–20)
CO2: 24 mmol/L (ref 22–32)
Calcium: 8.5 mg/dL — ABNORMAL LOW (ref 8.9–10.3)
Chloride: 107 mmol/L (ref 98–111)
Creatinine, Ser: 0.62 mg/dL (ref 0.44–1.00)
GFR, Estimated: >60 mL/min (ref >60)
Glucose, Bld: 95 mg/dL (ref 70–99)
Potassium: 3.9 mmol/L (ref 3.5–5.1)
Sodium: 136 mmol/L (ref 135–145)
Total Bilirubin: 0.9 mg/dL (ref 0.0–1.2)
Total Protein: 6.3 g/dL — ABNORMAL LOW (ref 6.5–8.1)

## 2023-10-22 LAB — CBC WITH DIFFERENTIAL/PLATELET
Abs Immature Granulocytes: 0.01 10*3/uL (ref 0.00–0.07)
Basophils Absolute: 0 10*3/uL (ref 0.0–0.1)
Basophils Relative: 1 %
Eosinophils Absolute: 0.3 10*3/uL (ref 0.0–0.5)
Eosinophils Relative: 7 %
HCT: 35 % — ABNORMAL LOW (ref 36.0–46.0)
Hemoglobin: 12 g/dL (ref 12.0–15.0)
Immature Granulocytes: 0 %
Lymphocytes Relative: 32 %
Lymphs Abs: 1.6 10*3/uL (ref 0.7–4.0)
MCH: 31.1 pg (ref 26.0–34.0)
MCHC: 34.3 g/dL (ref 30.0–36.0)
MCV: 90.7 fL (ref 80.0–100.0)
Monocytes Absolute: 0.3 10*3/uL (ref 0.1–1.0)
Monocytes Relative: 6 %
Neutro Abs: 2.7 10*3/uL (ref 1.7–7.7)
Neutrophils Relative %: 54 %
Platelets: 222 10*3/uL (ref 150–400)
RBC: 3.86 MIL/uL — ABNORMAL LOW (ref 3.87–5.11)
RDW: 14 % (ref 11.5–15.5)
WBC: 5 10*3/uL (ref 4.0–10.5)
nRBC: 0 % (ref 0.0–0.2)

## 2023-10-22 LAB — PHOSPHORUS: Phosphorus: 1.8 mg/dL — ABNORMAL LOW (ref 2.5–4.6)

## 2023-10-22 LAB — LIPASE, BLOOD: Lipase: 43 U/L (ref 11–51)

## 2023-10-22 LAB — PROTIME-INR
INR: 3 — ABNORMAL HIGH (ref 0.8–1.2)
INR: 2.8 — ABNORMAL HIGH (ref 0.8–1.2)
Prothrombin Time: 31.5 s — ABNORMAL HIGH (ref 11.4–15.2)
Prothrombin Time: 29.9 s — ABNORMAL HIGH (ref 11.4–15.2)

## 2023-10-22 LAB — CBG MONITORING, ED
Glucose-Capillary: 123 mg/dL — ABNORMAL HIGH (ref 70–99)
Glucose-Capillary: 97 mg/dL (ref 70–99)

## 2023-10-22 LAB — MAGNESIUM: Magnesium: 1.7 mg/dL (ref 1.7–2.4)

## 2023-10-22 LAB — GLUCOSE, CAPILLARY
Glucose-Capillary: 108 mg/dL — ABNORMAL HIGH (ref 70–99)
Glucose-Capillary: 116 mg/dL — ABNORMAL HIGH (ref 70–99)

## 2023-10-22 MED ORDER — POTASSIUM PHOSPHATES 15 MMOLE/5ML IV SOLN
30.0000 mmol | Freq: Once | INTRAVENOUS | Status: AC
  Administered 2023-10-22: 30 mmol via INTRAVENOUS
  Filled 2023-10-22: qty 10

## 2023-10-22 MED ORDER — HYDROMORPHONE HCL 1 MG/ML IJ SOLN: 1.0000 mg | INTRAMUSCULAR | Status: DC | PRN

## 2023-10-22 MED ORDER — LABETALOL HCL 5 MG/ML IV SOLN: 10.0000 mg | INTRAVENOUS | Status: DC | PRN

## 2023-10-22 MED ORDER — HYDROMORPHONE HCL 1 MG/ML IJ SOLN
2.0000 mg | INTRAMUSCULAR | Status: DC | PRN
  Administered 2023-10-22 – 2023-10-23 (×6): 2 mg via INTRAVENOUS
  Filled 2023-10-22 (×5): qty 2

## 2023-10-22 MED ORDER — HYDROMORPHONE HCL 1 MG/ML IJ SOLN: 2.0000 mg | INTRAMUSCULAR | Status: DC | PRN

## 2023-10-22 MED ORDER — HYDRALAZINE HCL 20 MG/ML IJ SOLN: 10.0000 mg | Freq: Four times a day (QID) | INTRAMUSCULAR | Status: DC | PRN

## 2023-10-22 MED ORDER — MAGNESIUM SULFATE 2 GM/50ML IV SOLN
2.0000 g | Freq: Once | INTRAVENOUS | Status: AC
  Administered 2023-10-22: 2 g via INTRAVENOUS
  Filled 2023-10-22: qty 50

## 2023-10-22 MED ORDER — HYDROMORPHONE HCL 1 MG/ML IJ SOLN
INTRAMUSCULAR | Status: AC
  Filled 2023-10-22: qty 2

## 2023-10-22 NOTE — Progress Notes (Signed)
 Patient c/o of burning of vein during potassium phosphate infusion.Stopped the infusion, no signs of infiltration. MD aware and pharmacy notified.

## 2023-10-22 NOTE — Consult Note (Signed)
 PHARMACY - ANTICOAGULATION CONSULT NOTE  Pharmacy Consult for Warfarin Indication:  On-X mechanical aortic valve ( INR goal 1.5-2 )  Allergies  Allergen Reactions   Latex Anaphylaxis, Hives and Itching    Other reaction(s): Other (See Comments) SKIN BURNING & PEELING    Nsaids Other (See Comments)    Serve ulcers; stomach aches, GERD.  Other Reaction(s): Other (See Comments)   Tomato Anaphylaxis, Hives and Itching   Gabapentin Palpitations    Has heart condition; also makes jittery   Metformin     Other Reaction(s): Abdominal Pain  Causes pancreatitis to worsen   Lisinopril     Other Reaction(s): Angioedema   Tramadol Itching and Hives   Duloxetine Anxiety    Makes her jittery    Patient Measurements: Height: 5\' 6"  (167.6 cm) IBW/kg (Calculated) : 59.3  Vital Signs: Temp: 98.9 F (37.2 C) (04/07 1042) Temp Source: Oral (04/07 1042) BP: 128/90 (04/07 1100) Pulse Rate: 69 (04/07 1100)  Labs: Recent Labs    10/20/23 2359 10/21/23 0044 10/21/23 0504 10/22/23 0446 10/22/23 1247  HGB 14.3  --  13.4 12.0  --   HCT 42.4  --  39.4 35.0*  --   PLT 295  --  249 222  --   LABPROT  --  35.6*  --   --  31.5*  INR  --  3.5*  --   --  3.0*  CREATININE 0.58  --  0.47 0.62  --   TROPONINIHS 60*  --   --   --   --     Estimated Creatinine Clearance: 121.3 mL/min (by C-G formula based on SCr of 0.62 mg/dL).   Medical History: Past Medical History:  Diagnosis Date   Arthritis    Asthma    Chronic pain disorder 03/19/2018   GERD (gastroesophageal reflux disease)    Headache    Heart murmur    History of trichomoniasis 03/19/2018   Hypertension    Intractable nausea and vomiting    Pancreatitis    PID (pelvic inflammatory disease) 02/14/2018   Uterine leiomyoma 03/19/2018    Medications:  (Not in a hospital admission)  Scheduled:   amLODipine  10 mg Oral Daily   atorvastatin  40 mg Oral Daily   insulin aspart  0-9 Units Subcutaneous Q4H    lipase/protease/amylase  36,000 Units Oral TID WC   losartan  50 mg Oral Daily   omega-3 acid ethyl esters  1 g Oral BID   pantoprazole  40 mg Oral Daily   spironolactone  25 mg Oral Daily   traZODone  100 mg Oral QHS   Warfarin - Pharmacist Dosing Inpatient   Does not apply q1600   Infusions:   magnesium sulfate bolus IVPB     potassium PHOSPHATE IVPB (in mmol)     PRN: acetaminophen **OR** acetaminophen, albuterol, hydrALAZINE, HYDROcodone-acetaminophen, HYDROmorphone (DILAUDID) injection, labetalol, magnesium hydroxide, naloxone, ondansetron **OR** ondansetron (ZOFRAN) IV Anti-infectives (From admission, onward)    None       Assessment: 43 y.o. 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 CBC stable.   Last seen at [facility] anticoagulation clinic on 09/28/23 with subtherapeutic INR of 1.1 which was below goal of 1.5-2 due to held doses  of warfarin during a recent admission. At that visit she was advised to continue 7.5 mg on Thursdays and 3.75 mg all other days (TWD 30 mg).   On 10/19/23 pt was seen at [facility] anticoagulation clinic and INR 5.1 which is above goal of 1.5 to 2 due to recent decrease in appetite after resuming Ozempic. Their plan:  1. Hold warfarin for 2 days then decrease to 3.75 mg daily (TWD 26.25 mg)  Date INR Warfarin Dose  4/6 3.5 HOLD  4/7 3.0 HOLD            Goal of Therapy:  INR 1.5 - 2.0 Monitor platelets by anticoagulation protocol: Yes   Plan:  INR is supratherapeutic. Will hold warfarin again today.  Daily INR. CBC at least every 3 days.   Bari Mantis PharmD Clinical Pharmacist 10/22/2023

## 2023-10-22 NOTE — Progress Notes (Signed)
 PROGRESS NOTE    Sarah Sosa  MWU:[id number] DOB: June 13, 1981 DOA: 10/21/2023 PCP: [facility], [facility]  Chief Complaint  Patient presents with   Abdominal Pain    Hospital Course:  Sarah Sosa is 43 y.o. female with asthma, GERD, osteoarthritis, chronic pain, status post aortic valve replacement mechanical prosthetic valve on Coumadin, hypertension, chronic pancreatitis, who presents to the ED with acute onset epigastric and left upper quadrant pain with associated nausea and vomiting.  In the ED blood pressure was 175/105 she had mild hypokalemia, lipase level 234, high-sensitivity troponin 60.  Alcohol level is 134.  Patient received Protonix, Zofran, potassium supplementation, Dilaudid.  She was admitted for acute on chronic pancreatitis  Subjective: Despite n.p.o. status, patient has been drinking ginger ale all night.  No nausea vomiting or a recurrence of abdominal pain with this.  She is requesting to advance her diet.  Bedside RN reports she was able to consume Jell-O, broth, and juice without issue.  No nausea, no vomiting. On arrival she reports her abdominal pain is severe and she is requesting increase of her Dilaudid.  We discussed that she should be n.p.o. if abdominal pain is still this severe.  She reports that her abdominal pain is not related to her appetite.  We discussed extensively that in order for the pancreas to heal she needs to be n.p.o. again.  She agrees.   Objective: Vitals:   10/21/23 2349 10/22/23 0500 10/22/23 0505 10/22/23 0730  BP: 136/79 137/77  (!) 169/86  Pulse: 84 74 73 74  Resp: 12 14 12  (!) 9  Temp: 98.1 F (36.7 C) 98.1 F (36.7 C)    TempSrc: Oral Oral    SpO2: 100% 90% 95% 94%   No intake or output data in the 24 hours ending 10/22/23 0856  There were no vitals filed for this visit.  Examination: General exam: Appears calm and comfortable, NAD  Respiratory system: No work of breathing, symmetric chest wall expansion Cardiovascular  system: S1 & S2 heard, RRR.  Gastrointestinal system: Abdomen is nondistended, soft, mild tenderness to palpation in epigastrium. Neuro: Alert and oriented. No focal neurological deficits. Extremities: Symmetric, expected ROM Skin: Flushed Psychiatry: Demonstrates appropriate judgement and insight. Mood & affect appropriate for situation.   Assessment & Plan:  Principal Problem:   Acute on chronic pancreatitis (HCC) Active Problems:   Alcohol intoxication (HCC)   Hypokalemia   Essential hypertension   Dyslipidemia   Status post aortic valve replacement with metallic valve   Chronic pain syndrome   Type 2 diabetes mellitus without complications (HCC)    Acute on chronic pancreatitis  -Patient reports chronic pancreatitis secondary to Ozempic.  She reports that she is still taking this medication.  She is also drinking alcohol.  She was acutely intoxicated on arrival. - Patient is not tolerating decrease in IV Dilaudid.  Will make her n.p.o. again. -Continue IV fluids. - Is chronically on Creon, will continue for now  Alcohol intoxication - Acutely intoxicated on arrival - Continue PPI - Monitor for alcohol withdrawal, continue with CIWA protocol - As needed Ativan - Discussed the importance of alcohol abstinence especially in setting of pancreatitis  Hypokalemia Hypomagnesemia - Replace as needed  Hypertension - Continue antihypertensives, titrate as needed  Dyslipidemia - Continue statin  Status post aortic valve replacement with mechanical valve - Continue Coumadin - Follow INR, pharmacy to assist in dosing  Type 2 diabetes without complications - Continue with sliding scale insulin for now. - Hold oral antihyperglycemics -  Recommend discontinuation of Ozempic entirely given recurrent pancreatitis.  Discussed this directly with the patient.  Chronic pain syndrome Opioid use disorder with history of pain seeking behaviors -Patient is currently on Suboxone 3  times daily at home.  Chart review reveals she was previously on methadone but this was discontinued due to continued request for increasing opioids.  She is refusing Suboxone during this admission  - Dilaudid for now, continue to taper.  Reinitiate Suboxone when able  GERD - PPI as above   DVT prophylaxis: Home dose warfarin   Code Status: Full Code Family Communication:  Discussed directly with patient Disposition: Currently n.p.o., on IV fluids, on IV Dilaudid.    Antimicrobials:  Anti-infectives (From admission, onward)    None       Data Reviewed: I have personally reviewed following labs and imaging studies CBC: Recent Labs  Lab 10/20/23 2359 10/21/23 0504 10/22/23 0446  WBC 7.3 7.4 5.0  NEUTROABS  --   --  2.7  HGB 14.3 13.4 12.0  HCT 42.4 39.4 35.0*  MCV 89.6 90.0 90.7  PLT 295 249 222   Basic Metabolic Panel: Recent Labs  Lab 10/20/23 2359 10/21/23 0504 10/22/23 0446  NA 140 134* 136  K 3.4* 4.3 3.9  CL 103 102 107  CO2 24 22 24   GLUCOSE 94 90 95  BUN 5* <5* <5*  CREATININE 0.58 0.47 0.62  CALCIUM 8.7* 8.0* 8.5*  MG 1.5*  --  1.7  PHOS  --   --  1.8*   GFR: Estimated Creatinine Clearance: 121.3 mL/min (by C-G formula based on SCr of 0.62 mg/dL). Liver Function Tests: Recent Labs  Lab 10/20/23 2359 10/22/23 0446  AST 26 17  ALT 24 15  ALKPHOS 89 74  BILITOT 0.5 0.9  PROT 8.2* 6.3*  ALBUMIN 4.2 3.3*   CBG: Recent Labs  Lab 10/21/23 0608 10/21/23 0813 10/22/23 0732  GLUCAP 86 102* 123*    No results found for this or any previous visit (from the past 240 hours).   Radiology Studies: No results found.  Scheduled Meds:  amLODipine  10 mg Oral Daily   atorvastatin  40 mg Oral Daily   insulin aspart  0-9 Units Subcutaneous Q4H   lipase/protease/amylase  36,000 Units Oral TID WC   losartan  50 mg Oral Daily   omega-3 acid ethyl esters  1 g Oral BID   pantoprazole  40 mg Oral Daily   spironolactone  25 mg Oral Daily   traZODone   100 mg Oral QHS   Warfarin - Pharmacist Dosing Inpatient   Does not apply q1600   Continuous Infusions:     LOS: 0 days  MDM: Patient is high risk for one or more organ failure.  They necessitate ongoing hospitalization for continued IV therapies and subsequent lab monitoring. Total time spent interpreting labs and vitals, coordinating care amongst consultants and care team members, directly assessing and discussing care with the patient and/or family: 55 min    Sarah Crape, DO [facility]  To contact the attending physician between 7A-7P please use Epic Chat. To contact the covering physician during after hours 7P-7A, please review Amion.   10/22/2023, 8:56 AM   *This document has been created with the assistance of dictation software. Please excuse typographical errors. *

## 2023-10-23 DIAGNOSIS — F10920 Alcohol use, unspecified with intoxication, uncomplicated: Secondary | ICD-10-CM | POA: Diagnosis not present

## 2023-10-23 DIAGNOSIS — K853 Drug induced acute pancreatitis without necrosis or infection: Secondary | ICD-10-CM | POA: Diagnosis not present

## 2023-10-23 DIAGNOSIS — E119 Type 2 diabetes mellitus without complications: Secondary | ICD-10-CM | POA: Diagnosis not present

## 2023-10-23 DIAGNOSIS — K859 Acute pancreatitis without necrosis or infection, unspecified: Secondary | ICD-10-CM | POA: Diagnosis not present

## 2023-10-23 LAB — CBC WITH DIFFERENTIAL/PLATELET
Abs Immature Granulocytes: 0 10*3/uL (ref 0.00–0.07)
Basophils Absolute: 0 10*3/uL (ref 0.0–0.1)
Basophils Relative: 1 %
Eosinophils Absolute: 0.3 10*3/uL (ref 0.0–0.5)
Eosinophils Relative: 7 %
HCT: 36.8 % (ref 36.0–46.0)
Hemoglobin: 12.3 g/dL (ref 12.0–15.0)
Immature Granulocytes: 0 %
Lymphocytes Relative: 36 %
Lymphs Abs: 1.5 10*3/uL (ref 0.7–4.0)
MCH: 30.1 pg (ref 26.0–34.0)
MCHC: 33.4 g/dL (ref 30.0–36.0)
MCV: 90.2 fL (ref 80.0–100.0)
Monocytes Absolute: 0.3 10*3/uL (ref 0.1–1.0)
Monocytes Relative: 7 %
Neutro Abs: 2.1 10*3/uL (ref 1.7–7.7)
Neutrophils Relative %: 49 %
Platelets: 236 10*3/uL (ref 150–400)
RBC: 4.08 MIL/uL (ref 3.87–5.11)
RDW: 14 % (ref 11.5–15.5)
WBC: 4.2 10*3/uL (ref 4.0–10.5)
nRBC: 0 % (ref 0.0–0.2)

## 2023-10-23 LAB — COMPREHENSIVE METABOLIC PANEL WITH GFR
ALT: 14 U/L (ref 0–44)
AST: 13 U/L — ABNORMAL LOW (ref 15–41)
Albumin: 3.4 g/dL — ABNORMAL LOW (ref 3.5–5.0)
Alkaline Phosphatase: 73 U/L (ref 38–126)
Anion gap: 6 (ref 5–15)
BUN: <5 mg/dL — ABNORMAL LOW (ref 6–20)
CO2: 24 mmol/L (ref 22–32)
Calcium: 9 mg/dL (ref 8.9–10.3)
Chloride: 107 mmol/L (ref 98–111)
Creatinine, Ser: 0.63 mg/dL (ref 0.44–1.00)
GFR, Estimated: >60 mL/min (ref >60)
Glucose, Bld: 101 mg/dL — ABNORMAL HIGH (ref 70–99)
Potassium: 4 mmol/L (ref 3.5–5.1)
Sodium: 137 mmol/L (ref 135–145)
Total Bilirubin: 1 mg/dL (ref 0.0–1.2)
Total Protein: 6.6 g/dL (ref 6.5–8.1)

## 2023-10-23 LAB — GLUCOSE, CAPILLARY
Glucose-Capillary: 105 mg/dL — ABNORMAL HIGH (ref 70–99)
Glucose-Capillary: 153 mg/dL — ABNORMAL HIGH (ref 70–99)
Glucose-Capillary: 122 mg/dL — ABNORMAL HIGH (ref 70–99)

## 2023-10-23 LAB — PHOSPHORUS: Phosphorus: 2.5 mg/dL (ref 2.5–4.6)

## 2023-10-23 LAB — PROTIME-INR
INR: 2.1 — ABNORMAL HIGH (ref 0.8–1.2)
Prothrombin Time: 23.8 s — ABNORMAL HIGH (ref 11.4–15.2)

## 2023-10-23 LAB — LIPASE, BLOOD: Lipase: 28 U/L (ref 11–51)

## 2023-10-23 LAB — MAGNESIUM: Magnesium: 1.8 mg/dL (ref 1.7–2.4)

## 2023-10-23 MED ORDER — HYDROMORPHONE HCL 1 MG/ML IJ SOLN
2.0000 mg | INTRAMUSCULAR | Status: DC | PRN
  Administered 2023-10-23 – 2023-10-24 (×6): 2 mg via INTRAVENOUS
  Filled 2023-10-23 (×6): qty 2

## 2023-10-23 MED ORDER — ACETAMINOPHEN 500 MG PO TABS
1000.0000 mg | ORAL_TABLET | Freq: Four times a day (QID) | ORAL | Status: DC
  Administered 2023-10-23 – 2023-10-26 (×9): 1000 mg via ORAL
  Filled 2023-10-23 (×12): qty 2

## 2023-10-23 MED ORDER — WARFARIN SODIUM 2.5 MG PO TABS
2.5000 mg | ORAL_TABLET | Freq: Once | ORAL | Status: AC
  Administered 2023-10-23: 2.5 mg via ORAL
  Filled 2023-10-23: qty 1

## 2023-10-23 MED ORDER — LACTATED RINGERS IV SOLN: INTRAVENOUS | Status: DC

## 2023-10-23 MED ORDER — HYDROMORPHONE HCL 1 MG/ML IJ SOLN: 1.0000 mg | INTRAMUSCULAR | Status: DC | PRN

## 2023-10-23 NOTE — Consult Note (Signed)
 PHARMACY - ANTICOAGULATION CONSULT NOTE  Pharmacy Consult for Warfarin Indication:  On-X mechanical aortic valve ( INR goal 1.5-2 )  Allergies  Allergen Reactions   Latex Anaphylaxis, Hives and Itching    Other reaction(s): Other (See Comments) SKIN BURNING & PEELING    Nsaids Other (See Comments)    Serve ulcers; stomach aches, GERD.  Other Reaction(s): Other (See Comments)   Tomato Anaphylaxis, Hives and Itching   Gabapentin Palpitations    Has heart condition; also makes jittery   Metformin     Other Reaction(s): Abdominal Pain  Causes pancreatitis to worsen   Lisinopril     Other Reaction(s): Angioedema   Tramadol Itching and Hives   Duloxetine Anxiety    Makes her jittery    Patient Measurements: Height: 5\' 6"  (167.6 cm) IBW/kg (Calculated) : 59.3  Vital Signs: Temp: 98.8 F (37.1 C) (04/08 0742) Temp Source: Oral (04/08 0324) BP: 104/69 (04/08 0742) Pulse Rate: 80 (04/08 0742)  Labs: Recent Labs    10/20/23 2359 10/21/23 0044 10/21/23 0504 10/22/23 0446 10/22/23 1247 10/22/23 1453 10/23/23 0322  HGB 14.3  --  13.4 12.0  --   --  12.3  HCT 42.4  --  39.4 35.0*  --   --  36.8  PLT 295  --  249 222  --   --  236  LABPROT  --    < >  --   --  31.5* 29.9* 23.8*  INR  --    < >  --   --  3.0* 2.8* 2.1*  CREATININE 0.58  --  0.47 0.62  --   --  0.63  TROPONINIHS 60*  --   --   --   --   --   --    < > = values in this interval not displayed.    Estimated Creatinine Clearance: 121.3 mL/min (by C-G formula based on SCr of 0.63 mg/dL).   Medical History: Past Medical History:  Diagnosis Date   Arthritis    Asthma    Chronic pain disorder 03/19/2018   GERD (gastroesophageal reflux disease)    Headache    Heart murmur    History of trichomoniasis 03/19/2018   Hypertension    Intractable nausea and vomiting    Pancreatitis    PID (pelvic inflammatory disease) 02/14/2018   Uterine leiomyoma 03/19/2018    Medications:  Medications Prior to  Admission  Medication Sig Dispense Refill Last Dose/Taking   albuterol (VENTOLIN HFA) 108 (90 Base) MCG/ACT inhaler Inhale 1-2 puffs into the lungs every 6 (six) hours as needed.   Taking As Needed   amitriptyline (ELAVIL) 10 MG tablet Take 10 mg by mouth 3 (three) times daily as needed.   10/20/2023   amLODipine (NORVASC) 10 MG tablet Take 1 tablet (10 mg total) by mouth daily. 30 tablet 2 10/19/2023   atorvastatin (LIPITOR) 40 MG tablet Take 40 mg by mouth daily.   10/19/2023   furosemide (LASIX) 40 MG tablet Take 20 mg by mouth daily.   Taking   lipase/protease/amylase (CREON) 36000 UNITS CPEP capsule Take 1 capsule (36,000 Units total) by mouth 3 (three) times daily with meals. 90 capsule 0 10/19/2023   losartan (COZAAR) 50 MG tablet Take 1 tablet (50 mg total) by mouth daily. 30 tablet 2 10/19/2023   metFORMIN (GLUCOPHAGE) 500 MG tablet Take 500 mg by mouth every morning.   10/19/2023   methadone (DOLOPHINE) 10 MG tablet Take 10 mg by mouth 4 (four) times  daily.   Past Month   naloxone (NARCAN) nasal spray 4 mg/0.1 mL Place 1 spray into the nose once as needed (to reverse overdose).   Taking As Needed   omeprazole (PRILOSEC) 40 MG capsule Take 40 mg by mouth daily.   10/19/2023   ondansetron (ZOFRAN) 4 MG tablet Take 1 tablet (4 mg total) by mouth daily as needed for nausea or vomiting. 30 tablet 1 Taking As Needed   ondansetron (ZOFRAN-ODT) 4 MG disintegrating tablet Take 1 tablet (4 mg total) by mouth every 8 (eight) hours as needed for nausea or vomiting. 20 tablet 0 Taking As Needed   Semaglutide, 1 MG/DOSE, 4 MG/3ML SOPN Inject 0.75 mLs into the skin once a week.   Taking   spironolactone (ALDACTONE) 25 MG tablet Take 25 mg by mouth daily.   10/19/2023   traZODone (DESYREL) 50 MG tablet Take 100 mg by mouth at bedtime.   10/19/2023   warfarin (COUMADIN) 7.5 MG tablet Take 3.25-7.5 mg by mouth daily. Take 3.25mg   M/W/F/S/Sun and T/Th 7.5mg  (1 tab)   10/17/2023   APPLE CIDER VINEGAR PO Take 1 capsule by mouth  3 (three) times daily. (Patient not taking: Reported on 10/21/2023)   Not Taking   HYDROcodone-acetaminophen (NORCO/VICODIN) 5-325 MG tablet Take 1 tablet by mouth every 4 (four) hours as needed. (Patient not taking: Reported on 10/21/2023) 8 tablet 0 Not Taking   omega-3 acid ethyl esters (LOVAZA) 1 g capsule Take 1 g by mouth 2 (two) times daily. (Patient not taking: Reported on 10/21/2023)   Not Taking   Scheduled:   amLODipine  10 mg Oral Daily   atorvastatin  40 mg Oral Daily   insulin aspart  0-9 Units Subcutaneous Q4H   lipase/protease/amylase  36,000 Units Oral TID WC   losartan  50 mg Oral Daily   omega-3 acid ethyl esters  1 g Oral BID   pantoprazole  40 mg Oral Daily   spironolactone  25 mg Oral Daily   traZODone  100 mg Oral QHS   Warfarin - Pharmacist Dosing Inpatient   Does not apply q1600   Infusions:    PRN: acetaminophen **OR** acetaminophen, albuterol, hydrALAZINE, HYDROcodone-acetaminophen, HYDROmorphone (DILAUDID) injection, labetalol, magnesium hydroxide, naloxone, ondansetron **OR** ondansetron (ZOFRAN) IV Anti-infectives (From admission, onward)    None       Assessment: 43 y.o. 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 CBC stable.   Last seen at [facility] anticoagulation clinic on 09/28/23 with subtherapeutic INR of 1.1 which was below goal of 1.5-2 due to held doses of warfarin during a recent admission. At that visit she was advised to continue 7.5 mg on Thursdays and 3.75 mg all other days (TWD 30 mg).   On 10/19/23 pt was seen at [facility] anticoagulation clinic and INR 5.1 which is above goal of 1.5 to 2 due to recent  decrease in appetite after resuming Ozempic. Their plan:  Per [facility] anticoag clinic note 4/4: 1. Hold warfarin for 2 days then decrease to 3.75 mg daily (TWD 26.25 mg)  Date INR Warfarin Dose  4/6 3.5 HOLD  4/7 3.0 HOLD  4/8 2.1         Goal of Therapy:  INR 1.5 - 2.0  Monitor platelets by anticoagulation protocol: Yes   Plan:  INR is very slightly above goal range. Will order warfarin 2.5 mg po x 1 Hgb 12.3  Plt 236 INR daily CBC per protocol  Bari Mantis PharmD Clinical Pharmacist 10/23/2023

## 2023-10-23 NOTE — Progress Notes (Signed)
 PROGRESS NOTE    Sarah Sosa  ZOX:[id number] DOB: 09-28-1980 DOA: 10/21/2023 PCP: Clinic, [facility]  Chief Complaint  Patient presents with   Abdominal Pain    Hospital Course:  Sarah Sosa is 43 y.o. female with asthma, GERD, osteoarthritis, chronic pain, status post aortic valve replacement mechanical prosthetic valve on Coumadin, hypertension, chronic pancreatitis, who presents to the ED with acute onset epigastric and left upper quadrant pain with associated nausea and vomiting.  In the ED blood pressure was 175/105 she had mild hypokalemia, lipase level 234, high-sensitivity troponin 60.  Alcohol level is 134.  Patient received Protonix, Zofran, potassium supplementation, Dilaudid.  She was admitted for acute on chronic pancreatitis  Subjective: Patient is requesting liquids today.  She is also requesting increase in Dilaudid.  We trialed a clear liquid diet the patient endorses after eating Jell-O she had nausea.  Have gone back to strict NPO.  Have discussed this with the patient.  The patient reports that she is able to drink liquids without issue.  She would like to proceed with a liquid diet and Dilaudid.  We discussed that for pancreatic recovery she should be n.p.o.   She is requesting increased Dilaudid, she reports Norco will not work for her, she reports allergies to NSAIDs, gabapentin, tramadol.  She is amendable to high-dose Tylenol.   Objective: Vitals:   10/22/23 1659 10/22/23 1942 10/23/23 0324 10/23/23 0742  BP: 129/76 138/86 135/76 104/69  Pulse: 73 67 70 80  Resp: 16 18 18 18   Temp: 97.7 F (36.5 C) 98 F (36.7 C) 98.2 F (36.8 C) 98.8 F (37.1 C)  TempSrc: Oral  Oral   SpO2: 100%  95% 99%  Height:        Intake/Output Summary (Last 24 hours) at 10/23/2023 1337 Last data filed at 10/22/2023 1948 Gross per 24 hour  Intake 315.7 ml  Output --  Net 315.7 ml    There were no vitals filed for this visit.  Examination: General exam: Appears calm and  comfortable, NAD  Respiratory system: No work of breathing, symmetric chest wall expansion Cardiovascular system: S1 & S2 heard, RRR.  Gastrointestinal system: Abdomen is nondistended, soft, mild tenderness to palpation in epigastrium. Neuro: Alert and oriented. No focal neurological deficits. Extremities: Symmetric, expected ROM Skin: Flushed Psychiatry: Demonstrates appropriate judgement and insight. Mood & affect appropriate for situation.   Assessment & Plan:  Principal Problem:   Acute on chronic pancreatitis (HCC) Active Problems:   Pancreatitis   Alcohol intoxication (HCC)   Hypokalemia   Essential hypertension   Dyslipidemia   Status post aortic valve replacement with metallic valve   Chronic pain syndrome   Type 2 diabetes mellitus without complications (HCC)    Acute on chronic pancreatitis  -Patient reports chronic pancreatitis secondary to Ozempic.  She reports that she is still taking this medication.  She is also drinking alcohol.  She was acutely intoxicated on arrival. - Patient endorses needing higher doses of Dilaudid. -- N.p.o. with small sips of water and ice chips only. -Continue IV fluids. - Is chronically on Creon, will continue for now  Alcohol intoxication - Acutely intoxicated on arrival - Continue PPI - Continue monitoring for alcohol withdrawal, continue CIWA protocol.  As needed Ativan - Discussed the importance of alcohol abstinence especially in setting of pancreatitis  Hypokalemia Hypomagnesemia -Continue to monitor CMP, replace as needed - Patient refused potassium phosphate supplementation yesterday citing IV burning  Hypertension - Continue antihypertensives, titrate as needed  Dyslipidemia -  Continue statin  Status post aortic valve replacement with mechanical valve - Continue Coumadin - Follow INR, pharmacy to assist in dosing  Type 2 diabetes without complications - Continue with sliding scale insulin for now. - Hold oral  antihyperglycemics - Recommend discontinuation of Ozempic entirely given recurrent pancreatitis.  Discussed this directly with the patient.  Chronic pain syndrome Opioid use disorder with history of pain seeking behaviors -Patient is currently on Suboxone 3 times daily at home.  Chart review reveals she was previously on methadone but this was discontinued due to continued request for increasing opioids.  She is refusing Suboxone during this admission  - Dilaudid for now, continue to taper.  Reinitiate Suboxone when able - Have discussed this complexity directly with the patient regarding her requests for increasing doses of Dilaudid during this admission  GERD - PPI as above   DVT prophylaxis: Home dose warfarin   Code Status: Full Code Family Communication:  Discussed directly with patient Disposition: Currently n.p.o., on IV fluids, on IV Dilaudid.    Antimicrobials:  Anti-infectives (From admission, onward)    None       Data Reviewed: I have personally reviewed following labs and imaging studies CBC: Recent Labs  Lab 10/20/23 2359 10/21/23 0504 10/22/23 0446 10/23/23 0322  WBC 7.3 7.4 5.0 4.2  NEUTROABS  --   --  2.7 2.1  HGB 14.3 13.4 12.0 12.3  HCT 42.4 39.4 35.0* 36.8  MCV 89.6 90.0 90.7 90.2  PLT 295 249 222 236   Basic Metabolic Panel: Recent Labs  Lab 10/20/23 2359 10/21/23 0504 10/22/23 0446 10/23/23 0322  NA 140 134* 136 137  K 3.4* 4.3 3.9 4.0  CL 103 102 107 107  CO2 24 22 24 24   GLUCOSE 94 90 95 101*  BUN 5* <5* <5* <5*  CREATININE 0.58 0.47 0.62 0.63  CALCIUM 8.7* 8.0* 8.5* 9.0  MG 1.5*  --  1.7 1.8  PHOS  --   --  1.8* 2.5   GFR: Estimated Creatinine Clearance: 121.3 mL/min (by C-G formula based on SCr of 0.63 mg/dL). Liver Function Tests: Recent Labs  Lab 10/20/23 2359 10/22/23 0446 10/23/23 0322  AST 26 17 13*  ALT 24 15 14   ALKPHOS 89 74 73  BILITOT 0.5 0.9 1.0  PROT 8.2* 6.3* 6.6  ALBUMIN 4.2 3.3* 3.4*   CBG: Recent  Labs  Lab 10/22/23 1138 10/22/23 1629 10/22/23 2016 10/23/23 0815 10/23/23 1126  GLUCAP 97 108* 116* 105* 153*    No results found for this or any previous visit (from the past 240 hours).   Radiology Studies: No results found.  Scheduled Meds:  acetaminophen  1,000 mg Oral Q6H   amLODipine  10 mg Oral Daily   atorvastatin  40 mg Oral Daily   insulin aspart  0-9 Units Subcutaneous Q4H   lipase/protease/amylase  36,000 Units Oral TID WC   losartan  50 mg Oral Daily   omega-3 acid ethyl esters  1 g Oral BID   pantoprazole  40 mg Oral Daily   spironolactone  25 mg Oral Daily   traZODone  100 mg Oral QHS   warfarin  2.5 mg Oral ONCE-1600   Warfarin - Pharmacist Dosing Inpatient   Does not apply q1600   Continuous Infusions:     LOS: 1 day  MDM: Patient is high risk for one or more organ failure.  They necessitate ongoing hospitalization for continued IV therapies and subsequent lab monitoring. Total time spent interpreting labs  and vitals, coordinating care amongst consultants and care team members, directly assessing and discussing care with the patient and/or family: 55 min    Debarah Crape, DO [facility]  To contact the attending physician between 7A-7P please use Epic Chat. To contact the covering physician during after hours 7P-7A, please review Amion.   10/23/2023, 1:37 PM   *This document has been created with the assistance of dictation software. Please excuse typographical errors. *

## 2023-10-23 NOTE — Plan of Care (Signed)
 Problem: Education: Goal: Ability to describe self-care measures that may prevent or decrease complications (Diabetes Survival Skills Education) will improve 10/23/2023 1554 by Londell Moh, RN Outcome: Progressing 10/23/2023 1554 by Londell Moh, RN Outcome: Progressing Goal: Individualized Educational Video(s) 10/23/2023 1554 by Londell Moh, RN Outcome: Progressing 10/23/2023 1554 by Londell Moh, RN Outcome: Progressing   Problem: Coping: Goal: Ability to adjust to condition or change in health will improve 10/23/2023 1554 by Londell Moh, RN Outcome: Progressing 10/23/2023 1554 by Londell Moh, RN Outcome: Progressing   Problem: Fluid Volume: Goal: Ability to maintain a balanced intake and output will improve 10/23/2023 1554 by Londell Moh, RN Outcome: Progressing 10/23/2023 1554 by Londell Moh, RN Outcome: Progressing   Problem: Health Behavior/Discharge Planning: Goal: Ability to identify and utilize available resources and services will improve 10/23/2023 1554 by Londell Moh, RN Outcome: Progressing 10/23/2023 1554 by Londell Moh, RN Outcome: Progressing Goal: Ability to manage health-related needs will improve 10/23/2023 1554 by Londell Moh, RN Outcome: Progressing 10/23/2023 1554 by Londell Moh, RN Outcome: Progressing   Problem: Metabolic: Goal: Ability to maintain appropriate glucose levels will improve 10/23/2023 1554 by Londell Moh, RN Outcome: Progressing 10/23/2023 1554 by Londell Moh, RN Outcome: Progressing   Problem: Nutritional: Goal: Maintenance of adequate nutrition will improve 10/23/2023 1554 by Londell Moh, RN Outcome: Progressing 10/23/2023 1554 by Londell Moh, RN Outcome: Progressing Goal: Progress toward achieving an optimal weight will improve 10/23/2023 1554 by Londell Moh, RN Outcome: Progressing 10/23/2023 1554 by Londell Moh, RN Outcome: Progressing   Problem: Skin Integrity: Goal: Risk for impaired skin  integrity will decrease 10/23/2023 1554 by Londell Moh, RN Outcome: Progressing 10/23/2023 1554 by Londell Moh, RN Outcome: Progressing   Problem: Tissue Perfusion: Goal: Adequacy of tissue perfusion will improve 10/23/2023 1554 by Londell Moh, RN Outcome: Progressing 10/23/2023 1554 by Londell Moh, RN Outcome: Progressing   Problem: Education: Goal: Knowledge of General Education information will improve Description: Including pain rating scale, medication(s)/side effects and non-pharmacologic comfort measures 10/23/2023 1554 by Londell Moh, RN Outcome: Progressing 10/23/2023 1554 by Londell Moh, RN Outcome: Progressing   Problem: Health Behavior/Discharge Planning: Goal: Ability to manage health-related needs will improve 10/23/2023 1554 by Londell Moh, RN Outcome: Progressing 10/23/2023 1554 by Londell Moh, RN Outcome: Progressing   Problem: Clinical Measurements: Goal: Ability to maintain clinical measurements within normal limits will improve 10/23/2023 1554 by Londell Moh, RN Outcome: Progressing 10/23/2023 1554 by Londell Moh, RN Outcome: Progressing Goal: Will remain free from infection 10/23/2023 1554 by Londell Moh, RN Outcome: Progressing 10/23/2023 1554 by Londell Moh, RN Outcome: Progressing Goal: Diagnostic test results will improve 10/23/2023 1554 by Londell Moh, RN Outcome: Progressing 10/23/2023 1554 by Londell Moh, RN Outcome: Progressing Goal: Respiratory complications will improve 10/23/2023 1554 by Londell Moh, RN Outcome: Progressing 10/23/2023 1554 by Londell Moh, RN Outcome: Progressing Goal: Cardiovascular complication will be avoided 10/23/2023 1554 by Londell Moh, RN Outcome: Progressing 10/23/2023 1554 by Londell Moh, RN Outcome: Progressing   Problem: Activity: Goal: Risk for activity intolerance will decrease 10/23/2023 1554 by Londell Moh, RN Outcome: Progressing 10/23/2023 1554 by Londell Moh, RN Outcome:  Progressing   Problem: Nutrition: Goal: Adequate nutrition will be maintained 10/23/2023 1554 by Londell Moh, RN Outcome: Progressing 10/23/2023 1554 by Londell Moh, RN Outcome: Progressing   Problem: Coping: Goal: Level of anxiety will decrease 10/23/2023 1554 by Londell Moh, RN Outcome: Progressing 10/23/2023 1554 by Londell Moh, RN Outcome: Progressing   Problem: Elimination: Goal: Will not experience complications related to bowel motility 10/23/2023 1554  by Londell Moh, RN Outcome: Progressing 10/23/2023 1554 by Londell Moh, RN Outcome: Progressing Goal: Will not experience complications related to urinary retention 10/23/2023 1554 by Londell Moh, RN Outcome: Progressing 10/23/2023 1554 by Londell Moh, RN Outcome: Progressing   Problem: Pain Managment: Goal: General experience of comfort will improve and/or be controlled Outcome: Progressing   Problem: Safety: Goal: Ability to remain free from injury will improve Outcome: Progressing   Problem: Skin Integrity: Goal: Risk for impaired skin integrity will decrease Outcome: Progressing

## 2023-10-24 DIAGNOSIS — K859 Acute pancreatitis without necrosis or infection, unspecified: Secondary | ICD-10-CM | POA: Diagnosis not present

## 2023-10-24 DIAGNOSIS — K861 Other chronic pancreatitis: Secondary | ICD-10-CM | POA: Diagnosis not present

## 2023-10-24 LAB — COMPREHENSIVE METABOLIC PANEL WITH GFR
ALT: 11 U/L (ref 0–44)
AST: 14 U/L — ABNORMAL LOW (ref 15–41)
Albumin: 3.4 g/dL — ABNORMAL LOW (ref 3.5–5.0)
Alkaline Phosphatase: 67 U/L (ref 38–126)
Anion gap: 6 (ref 5–15)
BUN: <5 mg/dL — ABNORMAL LOW (ref 6–20)
CO2: 24 mmol/L (ref 22–32)
Calcium: 8.9 mg/dL (ref 8.9–10.3)
Chloride: 105 mmol/L (ref 98–111)
Creatinine, Ser: 0.58 mg/dL (ref 0.44–1.00)
GFR, Estimated: >60 mL/min (ref >60)
Glucose, Bld: 90 mg/dL (ref 70–99)
Potassium: 3.7 mmol/L (ref 3.5–5.1)
Sodium: 135 mmol/L (ref 135–145)
Total Bilirubin: 1.1 mg/dL (ref 0.0–1.2)
Total Protein: 6.5 g/dL (ref 6.5–8.1)

## 2023-10-24 LAB — CBC WITH DIFFERENTIAL/PLATELET
Abs Immature Granulocytes: 0 10*3/uL (ref 0.00–0.07)
Basophils Absolute: 0 10*3/uL (ref 0.0–0.1)
Basophils Relative: 1 %
Eosinophils Absolute: 0.2 10*3/uL (ref 0.0–0.5)
Eosinophils Relative: 6 %
HCT: 36.6 % (ref 36.0–46.0)
Hemoglobin: 12.5 g/dL (ref 12.0–15.0)
Immature Granulocytes: 0 %
Lymphocytes Relative: 48 %
Lymphs Abs: 1.9 10*3/uL (ref 0.7–4.0)
MCH: 30.6 pg (ref 26.0–34.0)
MCHC: 34.2 g/dL (ref 30.0–36.0)
MCV: 89.5 fL (ref 80.0–100.0)
Monocytes Absolute: 0.3 10*3/uL (ref 0.1–1.0)
Monocytes Relative: 9 %
Neutro Abs: 1.4 10*3/uL — ABNORMAL LOW (ref 1.7–7.7)
Neutrophils Relative %: 36 %
Platelets: 208 10*3/uL (ref 150–400)
RBC: 4.09 MIL/uL (ref 3.87–5.11)
RDW: 13.7 % (ref 11.5–15.5)
WBC: 3.8 10*3/uL — ABNORMAL LOW (ref 4.0–10.5)
nRBC: 0 % (ref 0.0–0.2)

## 2023-10-24 LAB — GLUCOSE, CAPILLARY
Glucose-Capillary: 111 mg/dL — ABNORMAL HIGH (ref 70–99)
Glucose-Capillary: 133 mg/dL — ABNORMAL HIGH (ref 70–99)

## 2023-10-24 LAB — LIPASE, BLOOD: Lipase: 27 U/L (ref 11–51)

## 2023-10-24 LAB — MAGNESIUM: Magnesium: 1.5 mg/dL — ABNORMAL LOW (ref 1.7–2.4)

## 2023-10-24 LAB — PHOSPHORUS: Phosphorus: 2.4 mg/dL — ABNORMAL LOW (ref 2.5–4.6)

## 2023-10-24 LAB — PROTIME-INR
INR: 1.2 (ref 0.8–1.2)
Prothrombin Time: 15.5 s — ABNORMAL HIGH (ref 11.4–15.2)

## 2023-10-24 MED ORDER — LACTATED RINGERS IV SOLN: INTRAVENOUS | Status: DC

## 2023-10-24 MED ORDER — MORPHINE SULFATE (PF) 2 MG/ML IV SOLN
1.0000 mg | INTRAVENOUS | Status: DC | PRN
  Administered 2023-10-24 – 2023-10-25 (×3): 1 mg via INTRAVENOUS
  Filled 2023-10-24 (×3): qty 1

## 2023-10-24 MED ORDER — OXYCODONE HCL 5 MG PO TABS
5.0000 mg | ORAL_TABLET | ORAL | Status: DC | PRN
  Administered 2023-10-24 – 2023-10-26 (×8): 5 mg via ORAL
  Filled 2023-10-24 (×8): qty 1

## 2023-10-24 MED ORDER — HYDROCODONE-ACETAMINOPHEN 5-325 MG PO TABS
1.0000 | ORAL_TABLET | ORAL | Status: DC | PRN
Start: 2023-10-24 — End: 2023-10-24
  Administered 2023-10-24 (×2): 2 via ORAL
  Filled 2023-10-24 (×2): qty 2

## 2023-10-24 MED ORDER — MAGNESIUM SULFATE 2 GM/50ML IV SOLN
2.0000 g | Freq: Once | INTRAVENOUS | Status: AC
  Administered 2023-10-24: 2 g via INTRAVENOUS
  Filled 2023-10-24: qty 50

## 2023-10-24 MED ORDER — WARFARIN SODIUM 4 MG PO TABS
4.0000 mg | ORAL_TABLET | Freq: Once | ORAL | Status: AC
  Administered 2023-10-24: 4 mg via ORAL
  Filled 2023-10-24: qty 1

## 2023-10-24 MED ORDER — MORPHINE SULFATE (PF) 2 MG/ML IV SOLN
2.0000 mg | INTRAVENOUS | Status: DC | PRN
  Administered 2023-10-24 (×3): 2 mg via INTRAVENOUS
  Filled 2023-10-24 (×3): qty 1

## 2023-10-24 NOTE — Plan of Care (Signed)
 Pt refuses bowel regimen. Pt states its normal for her to go several days without a BM.  Educated on opioid constipation/  Pain controlled slightly with prn meds .   Problem: Education: Goal: Ability to describe self-care measures that may prevent or decrease complications (Diabetes Survival Skills Education) will improve Outcome: Progressing Goal: Individualized Educational Video(s) Outcome: Progressing   Problem: Coping: Goal: Ability to adjust to condition or change in health will improve Outcome: Progressing   Problem: Fluid Volume: Goal: Ability to maintain a balanced intake and output will improve Outcome: Progressing   Problem: Health Behavior/Discharge Planning: Goal: Ability to identify and utilize available resources and services will improve Outcome: Progressing Goal: Ability to manage health-related needs will improve Outcome: Progressing   Problem: Metabolic: Goal: Ability to maintain appropriate glucose levels will improve Outcome: Progressing   Problem: Nutritional: Goal: Maintenance of adequate nutrition will improve Outcome: Progressing Goal: Progress toward achieving an optimal weight will improve Outcome: Progressing   Problem: Skin Integrity: Goal: Risk for impaired skin integrity will decrease Outcome: Progressing   Problem: Tissue Perfusion: Goal: Adequacy of tissue perfusion will improve Outcome: Progressing   Problem: Education: Goal: Knowledge of General Education information will improve Description: Including pain rating scale, medication(s)/side effects and non-pharmacologic comfort measures Outcome: Progressing   Problem: Health Behavior/Discharge Planning: Goal: Ability to manage health-related needs will improve Outcome: Progressing   Problem: Clinical Measurements: Goal: Ability to maintain clinical measurements within normal limits will improve Outcome: Progressing Goal: Will remain free from infection Outcome: Progressing Goal:  Diagnostic test results will improve Outcome: Progressing Goal: Respiratory complications will improve Outcome: Progressing Goal: Cardiovascular complication will be avoided Outcome: Progressing   Problem: Activity: Goal: Risk for activity intolerance will decrease Outcome: Progressing   Problem: Nutrition: Goal: Adequate nutrition will be maintained Outcome: Progressing   Problem: Coping: Goal: Level of anxiety will decrease Outcome: Progressing   Problem: Elimination: Goal: Will not experience complications related to bowel motility Outcome: Progressing Goal: Will not experience complications related to urinary retention Outcome: Progressing   Problem: Pain Managment: Goal: General experience of comfort will improve and/or be controlled Outcome: Progressing   Problem: Safety: Goal: Ability to remain free from injury will improve Outcome: Progressing   Problem: Skin Integrity: Goal: Risk for impaired skin integrity will decrease Outcome: Progressing

## 2023-10-24 NOTE — Consult Note (Signed)
 PHARMACY - ANTICOAGULATION CONSULT NOTE  Pharmacy Consult for Warfarin Indication:  On-X mechanical aortic valve ( INR goal 1.5-2 )  Allergies  Allergen Reactions   Latex Anaphylaxis, Hives and Itching    Other reaction(s): Other (See Comments) SKIN BURNING & PEELING    Nsaids Other (See Comments)    Serve ulcers; stomach aches, GERD.  Other Reaction(s): Other (See Comments)   Tomato Anaphylaxis, Hives and Itching   Gabapentin Palpitations    Has heart condition; also makes jittery   Metformin     Other Reaction(s): Abdominal Pain  Causes pancreatitis to worsen   Lisinopril     Other Reaction(s): Angioedema   Tramadol Itching and Hives   Duloxetine Anxiety    Makes her jittery    Patient Measurements: Height: 5\' 6"  (167.6 cm) IBW/kg (Calculated) : 59.3  Vital Signs: Temp: 98.2 F (36.8 C) (04/09 0419) Temp Source: Oral (04/09 0419) BP: 103/64 (04/09 0419) Pulse Rate: 66 (04/09 0419)  Labs: Recent Labs    10/22/23 0446 10/22/23 1247 10/22/23 1453 10/23/23 0322 10/24/23 0337  HGB 12.0  --   --  12.3 12.5  HCT 35.0*  --   --  36.8 36.6  PLT 222  --   --  236 208  LABPROT  --    < > 29.9* 23.8* 15.5*  INR  --    < > 2.8* 2.1* 1.2  CREATININE 0.62  --   --  0.63 0.58   < > = values in this interval not displayed.    Estimated Creatinine Clearance: 121.3 mL/min (by C-G formula based on SCr of 0.58 mg/dL).   Medical History: Past Medical History:  Diagnosis Date   Arthritis    Asthma    Chronic pain disorder 03/19/2018   GERD (gastroesophageal reflux disease)    Headache    Heart murmur    History of trichomoniasis 03/19/2018   Hypertension    Intractable nausea and vomiting    Pancreatitis    PID (pelvic inflammatory disease) 02/14/2018   Uterine leiomyoma 03/19/2018    Medications:  Medications Prior to Admission  Medication Sig Dispense Refill Last Dose/Taking   albuterol (VENTOLIN HFA) 108 (90 Base) MCG/ACT inhaler Inhale 1-2 puffs into the  lungs every 6 (six) hours as needed.   Taking As Needed   amitriptyline (ELAVIL) 10 MG tablet Take 10 mg by mouth 3 (three) times daily as needed.   10/20/2023   amLODipine (NORVASC) 10 MG tablet Take 1 tablet (10 mg total) by mouth daily. 30 tablet 2 10/19/2023   atorvastatin (LIPITOR) 40 MG tablet Take 40 mg by mouth daily.   10/19/2023   furosemide (LASIX) 40 MG tablet Take 20 mg by mouth daily.   Taking   lipase/protease/amylase (CREON) 36000 UNITS CPEP capsule Take 1 capsule (36,000 Units total) by mouth 3 (three) times daily with meals. 90 capsule 0 10/19/2023   losartan (COZAAR) 50 MG tablet Take 1 tablet (50 mg total) by mouth daily. 30 tablet 2 10/19/2023   metFORMIN (GLUCOPHAGE) 500 MG tablet Take 500 mg by mouth every morning.   10/19/2023   methadone (DOLOPHINE) 10 MG tablet Take 10 mg by mouth 4 (four) times daily.   Past Month   naloxone (NARCAN) nasal spray 4 mg/0.1 mL Place 1 spray into the nose once as needed (to reverse overdose).   Taking As Needed   omeprazole (PRILOSEC) 40 MG capsule Take 40 mg by mouth daily.   10/19/2023   ondansetron (ZOFRAN) 4 MG  tablet Take 1 tablet (4 mg total) by mouth daily as needed for nausea or vomiting. 30 tablet 1 Taking As Needed   ondansetron (ZOFRAN-ODT) 4 MG disintegrating tablet Take 1 tablet (4 mg total) by mouth every 8 (eight) hours as needed for nausea or vomiting. 20 tablet 0 Taking As Needed   Semaglutide, 1 MG/DOSE, 4 MG/3ML SOPN Inject 0.75 mLs into the skin once a week.   Taking   spironolactone (ALDACTONE) 25 MG tablet Take 25 mg by mouth daily.   10/19/2023   traZODone (DESYREL) 50 MG tablet Take 100 mg by mouth at bedtime.   10/19/2023   warfarin (COUMADIN) 7.5 MG tablet Take 3.25-7.5 mg by mouth daily. Take 3.25mg   M/W/F/S/Sun and T/Th 7.5mg  (1 tab)   10/17/2023   APPLE CIDER VINEGAR PO Take 1 capsule by mouth 3 (three) times daily. (Patient not taking: Reported on 10/21/2023)   Not Taking   HYDROcodone-acetaminophen (NORCO/VICODIN) 5-325 MG tablet  Take 1 tablet by mouth every 4 (four) hours as needed. (Patient not taking: Reported on 10/21/2023) 8 tablet 0 Not Taking   omega-3 acid ethyl esters (LOVAZA) 1 g capsule Take 1 g by mouth 2 (two) times daily. (Patient not taking: Reported on 10/21/2023)   Not Taking   Scheduled:   acetaminophen  1,000 mg Oral Q6H   amLODipine  10 mg Oral Daily   atorvastatin  40 mg Oral Daily   insulin aspart  0-9 Units Subcutaneous Q4H   lipase/protease/amylase  36,000 Units Oral TID WC   losartan  50 mg Oral Daily   omega-3 acid ethyl esters  1 g Oral BID   pantoprazole  40 mg Oral Daily   spironolactone  25 mg Oral Daily   traZODone  100 mg Oral QHS   Warfarin - Pharmacist Dosing Inpatient   Does not apply q1600   Infusions:   lactated ringers 100 mL/hr at 10/24/23 0326    PRN: albuterol, hydrALAZINE, HYDROcodone-acetaminophen, HYDROmorphone (DILAUDID) injection, labetalol, magnesium hydroxide, naloxone, ondansetron **OR** ondansetron (ZOFRAN) IV Anti-infectives (From admission, onward)    None       Assessment: 43 y.o. 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 CBC stable.   Last seen at [facility] anticoagulation clinic on 09/28/23 with subtherapeutic INR of 1.1 which was below goal of 1.5-2 due to held doses of warfarin during a recent admission. At that visit she was advised to continue 7.5 mg on Thursdays and 3.75 mg all other days (TWD 30 mg).   On 10/19/23 pt was seen at [facility] anticoagulation clinic and INR 5.1 which is above goal of 1.5 to 2 due to recent decrease in appetite after resuming Ozempic. Their plan:  Per [facility] anticoag clinic note 4/4: 1.  Hold warfarin for 2 days then decrease to 3.75 mg daily (TWD 26.25 mg)  Date INR Warfarin Dose  4/6 3.5 HOLD  4/7 3.0 HOLD  4/8 2.1 2.5 mg  4/9 1.2             Goal of Therapy:  INR 1.5 - 2.0 Monitor platelets by anticoagulation protocol: Yes   Plan:  INR is slightly below goal range. Likely from held doses. Will order warfarin 4 mg po x 1 today Hgb  12.5  Plt 208 INR daily CBC per protocol  Bari Mantis PharmD Clinical Pharmacist 10/24/2023

## 2023-10-24 NOTE — TOC Initial Note (Signed)
 Transition of Care [facility]) - Initial/Assessment Note    Patient Details  Name: Sarah Sosa MRN: [id number] Date of Birth: [birth year]  [facility]) CM/SW Contact:    Margarito Liner, LCSW Phone Number: 10/24/2023, 11:29 AM  Clinical Narrative: Readmission prevention screen complete. CSW met with patient. No supports at bedside. CSW introduced role and explained that discharge planning would be discussed. PCP is Dr. Sela Hua at [facility]. Patient sometimes drives herself to appointments. Her boyfriend transports her when needed. She uses [facility] in [place]. No issues obtaining medications. Patient lives home with her two children and father. No home health prior to admission. She has a rollator that she uses as needed. No further concerns. CSW will continue to follow patient for support and facilitate return home once stable. Her father will transport her home at discharge.                 Expected Discharge Plan: Home/Self Care Barriers to Discharge: Continued Medical Work up   Patient Goals and CMS Choice            Expected Discharge Plan and Services     Post Acute Care Choice: NA Living arrangements for the past 2 months: Apartment                                      Prior Living Arrangements/Services Living arrangements for the past 2 months: Apartment Lives with:: Adult Children, Parents Patient language and need for interpreter reviewed:: Yes Do you feel safe going back to the place where you live?: Yes      Need for Family Participation in Patient Care: Yes (Comment) Care giver support system in place?: Yes (comment) Current home services: DME Criminal Activity/Legal Involvement Pertinent to Current Situation/Hospitalization: No - Comment as needed  Activities of Daily Living   ADL Screening (condition at time of admission) Independently performs ADLs?: Yes (appropriate for developmental age) Is the patient deaf or have  difficulty hearing?: No Does the patient have difficulty seeing, even when wearing glasses/contacts?: No Does the patient have difficulty concentrating, remembering, or making decisions?: No  Permission Sought/Granted                  Emotional Assessment Appearance:: Appears stated age Attitude/Demeanor/Rapport: Engaged, Gracious Affect (typically observed): Accepting, Appropriate, Calm, Pleasant Orientation: : Oriented to Self, Oriented to Place, Oriented to  Time, Oriented to Situation Alcohol / Substance Use: Not Applicable Psych Involvement: No (comment)  Admission diagnosis:  Hypokalemia [E87.6] Hypomagnesemia [E83.42] Pancreatitis [K85.90] Acute on chronic pancreatitis (HCC) [K85.90, K86.1] Alcohol-induced acute pancreatitis without infection or necrosis [K85.20] Patient Active Problem List   Diagnosis Date Noted   Acute on chronic pancreatitis (HCC) 10/21/2023   Dyslipidemia 10/21/2023   Chronic pain syndrome 10/21/2023   Alcohol intoxication (HCC) 10/21/2023   Type 2 diabetes mellitus without complications (HCC) 10/21/2023   Status post aortic valve replacement with metallic valve 10/21/2023   Pancreatitis 09/15/2023   Secondary hypercoagulability disorder (HCC) 09/15/2023   Adnexal cyst 09/15/2023   Chest pain 09/15/2023   Chronic diastolic CHF (congestive heart failure) (HCC) 09/15/2023   DM2 (diabetes mellitus, type 2) (HCC) 09/15/2023   Acute pain 09/15/2023   Elevated troponin 07/13/2023   Subtherapeutic international normalized ratio (INR) 07/13/2023   Chronic pancreatitis (HCC) 07/13/2023   S/p aortic valve replacement with 'On-X" valve (INR target of 1.5 to 2) and  ascending aortic graft ([facility], 03/2022) 07/13/2023   CAP (community acquired pneumonia) 07/13/2023   Opioid overdose, accidental or unintentional, initial encounter (HCC) 03/20/2023   Vomiting 03/20/2023   Obesity, Class III, BMI 40-49.9 (morbid obesity) (HCC) 03/20/2023   History of  pancreatitis 03/20/2023   Acute respiratory failure with hypoxia (HCC) 03/20/2023   Chronic use of opiate drug for therapeutic purpose 06/02/2022   S/P ascending aortic replacement 04/12/2022   Hypomagnesemia 11/23/2021   Essential hypertension 11/20/2021   GERD without esophagitis 11/20/2021   (HFpEF) heart failure with preserved ejection fraction (HCC) 11/20/2021   Peripheral neuropathy 11/20/2021   Asthma, chronic 11/20/2021   Acute alcoholic pancreatitis 11/20/2021   Alcohol abuse 11/20/2021   Acute pancreatitis 11/20/2021   History of heavy alcohol consumption 05/22/2021   Recurrent pancreatitis 06/04/2020   Intractable nausea and vomiting    Marijuana use    Hypertensive urgency 04/03/2020   Hypokalemia 04/03/2020   Tobacco abuse 04/03/2020   History of substance abuse (HCC) 04/03/2020   Ileus (HCC) 04/21/2018   Postoperative ileus (HCC) 04/20/2018   Postoperative state 04/15/2018   Uterine leiomyoma 03/19/2018   Menorrhagia with regular cycle 03/19/2018   Chronic pain disorder 03/19/2018   Pelvic pain 03/19/2018   Primary hypertension 03/19/2018   PCP:  [facility]:   [facility] [place], [place] - [address] [place] [address] Phone: [phone] Fax: [phone]  [facility] [address] [place], [place] - [address] [place] [address] Phone: [phone] Fax: [phone]     Social Drivers of Health (SDOH) Social History: SDOH Screenings   Food Insecurity: No Food Insecurity (10/22/2023)  Housing: Low Risk  (10/22/2023)  Recent Concern: Housing - High Risk (09/15/2023)   Received from [facility]  Transportation Needs: No Transportation Needs (10/22/2023)  Utilities: Not At Risk (10/22/2023)  Financial Resource Strain: High Risk (07/19/2023)   Received from [facility]  Social Connections: Moderately Isolated (06/05/2021)   [facility] from [facility]  Stress: No Stress Concern Present (08/31/2023)   [facility] from [facility]  Tobacco Use: High Risk (10/20/2023)   SDOH Interventions:     Readmission Risk Interventions    10/24/2023   11:27 AM 09/20/2023   10:58 AM 08/04/2023    4:19 PM  Readmission Risk Prevention Plan  Transportation Screening Complete Complete Complete  PCP or Specialist Appt within 3-5 Days   Complete  HRI or Home Care Consult   Complete  Social Work Consult for Recovery Care Planning/Counseling   Complete  Palliative Care Screening   Not Applicable  Medication Review Ocean[employer]) Complete Complete Complete  PCP or Specialist appointment within 3-5 days of discharge Complete Complete   HRI or Home Care Consult  Complete   SW Recovery Care/Counseling Consult Complete Complete   Palliative Care Screening Not Applicable Not Applicable   Skilled Nursing Facility Not Applicable Not Applicable

## 2023-10-24 NOTE — Progress Notes (Signed)
 Leader round completed on this patient. Her bed was not in the lowest position. She requested for it to stay that way. I explained why it's best and safe to have to bed in lowest position. She expressed understanding but still wanted to keep the bed in the current position.

## 2023-10-24 NOTE — Progress Notes (Signed)
  PROGRESS NOTE    Sarah Sosa  ZOX:[id number] DOB: 01/15/1981 DOA: 10/21/2023 PCP: Clinic[facility]  132A/132A-AA  LOS: 2 days   Brief hospital course:   Assessment & Plan: Sarah Sosa is 43 y.o. female with asthma, GERD, osteoarthritis, chronic pain, status post aortic valve replacement mechanical prosthetic valve on Coumadin, hypertension, chronic pancreatitis, who presents to the ED with acute onset epigastric and left upper quadrant pain with associated nausea and vomiting.  In the ED blood pressure was 175/105 she had mild hypokalemia, lipase level 234, high-sensitivity troponin 60.  Alcohol level is 134.   Acute on chronic pancreatitis  -Patient reports chronic pancreatitis secondary to Ozempic.  She reports that she is still taking this medication.  She is also drinking alcohol.  She was acutely intoxicated on arrival. --cont MIVF --cont Creon --d/c IV dilaudid.  Please do not resume. --oxycodone PRN, IV morphine if pt can't take oral.   Alcohol intoxication - Acutely intoxicated on arrival - Discussed the importance of alcohol abstinence especially in setting of pancreatitis   Hypokalemia Hypomagnesemia --monitor and supplement PRN   Hypertension --cont amlodipine, losartan and spironolactone   Dyslipidemia - Continue statin   Status post aortic valve replacement with mechanical valve --cont warfarin - Follow INR, pharmacy to assist in dosing   Type 2 diabetes without complications --A1c 6.4, well controlled --d/c BG checks, no need   Chronic pain syndrome Opioid use disorder with history of pain seeking behaviors -Patient is currently on Suboxone 3 times daily at home.  Chart review reveals she was previously on methadone but this was discontinued due to continued request for increasing opioids.  She is refusing Suboxone during this admission  --d/c IV dilaudid.     GERD --cont PPI    DVT prophylaxis: [id number] Code Status: Full code  Family  Communication:  Level of care: Med-Surg Dispo:   The patient is from: home Anticipated d/c is to: home Anticipated d/c date is: 1-2 days   Subjective and Interval History:  Pt reported tolerating clear liquid.   Objective: Vitals:   10/23/23 2134 10/24/23 0419 10/24/23 0732 10/24/23 1605  BP: 136/83 103/64 (!) 99/53 120/77  Pulse: 66 66 66 76  Resp: 18 16 16 16   Temp: 98.5 F (36.9 C) 98.2 F (36.8 C) (!) 97.4 F (36.3 C) 98.4 F (36.9 C)  TempSrc: Oral Oral Oral Oral  SpO2: 100% 100% 98% 100%  Height:        Intake/Output Summary (Last 24 hours) at 10/24/2023 1912 Last data filed at 10/24/2023 0326 Gross per 24 hour  Intake 1183.7 ml  Output --  Net 1183.7 ml   There were no vitals filed for this visit.  Examination:   Constitutional: NAD, AAOx3 HEENT: conjunctivae and lids normal, EOMI CV: No cyanosis.   RESP: normal respiratory effort, on RA Neuro: II - XII grossly intact.     Data Reviewed: I have personally reviewed labs and imaging studies  Time spent: 50 minutes  Darlin Priestly, MD [facility] If 7PM-7AM, please contact night-coverage 10/24/2023, 7:12 PM

## 2023-10-24 NOTE — Plan of Care (Signed)
  Problem: Coping: Goal: Ability to adjust to condition or change in health will improve Outcome: Progressing   Problem: Metabolic: Goal: Ability to maintain appropriate glucose levels will improve Outcome: Progressing   Problem: Clinical Measurements: Goal: Diagnostic test results will improve Outcome: Progressing   Problem: Activity: Goal: Risk for activity intolerance will decrease Outcome: Progressing   Problem: Pain Managment: Goal: General experience of comfort will improve and/or be controlled Outcome: Progressing   Problem: Safety: Goal: Ability to remain free from injury will improve Outcome: Progressing

## 2023-10-25 DIAGNOSIS — K861 Other chronic pancreatitis: Secondary | ICD-10-CM | POA: Diagnosis not present

## 2023-10-25 DIAGNOSIS — K859 Acute pancreatitis without necrosis or infection, unspecified: Secondary | ICD-10-CM | POA: Diagnosis not present

## 2023-10-25 LAB — PROTIME-INR
INR: 1.2 (ref 0.8–1.2)
Prothrombin Time: 15.3 s — ABNORMAL HIGH (ref 11.4–15.2)

## 2023-10-25 LAB — PHOSPHORUS: Phosphorus: 2.2 mg/dL — ABNORMAL LOW (ref 2.5–4.6)

## 2023-10-25 LAB — MAGNESIUM: Magnesium: 1.5 mg/dL — ABNORMAL LOW (ref 1.7–2.4)

## 2023-10-25 MED ORDER — ENOXAPARIN SODIUM 120 MG/0.8ML IJ SOSY
1.0000 mg/kg | PREFILLED_SYRINGE | Freq: Two times a day (BID) | INTRAMUSCULAR | Status: DC
  Administered 2023-10-25 (×2): 120 mg via SUBCUTANEOUS
  Filled 2023-10-25 (×3): qty 0.8

## 2023-10-25 MED ORDER — POLYETHYLENE GLYCOL 3350 17 G PO PACK
34.0000 g | PACK | Freq: Two times a day (BID) | ORAL | Status: DC
Start: 2023-10-25 — End: 2023-10-26
  Administered 2023-10-25: 34 g via ORAL
  Filled 2023-10-25 (×2): qty 2

## 2023-10-25 MED ORDER — MAGNESIUM SULFATE 4 GM/100ML IV SOLN
4.0000 g | Freq: Once | INTRAVENOUS | Status: AC
  Administered 2023-10-25: 4 g via INTRAVENOUS
  Filled 2023-10-25: qty 100

## 2023-10-25 MED ORDER — SODIUM PHOSPHATES 45 MMOLE/15ML IV SOLN
30.0000 mmol | Freq: Once | INTRAVENOUS | Status: AC
  Administered 2023-10-25: 30 mmol via INTRAVENOUS
  Filled 2023-10-25: qty 10

## 2023-10-25 MED ORDER — WARFARIN SODIUM 7.5 MG PO TABS
7.5000 mg | ORAL_TABLET | Freq: Once | ORAL | Status: AC
  Administered 2023-10-25: 7.5 mg via ORAL
  Filled 2023-10-25: qty 1

## 2023-10-25 NOTE — Consult Note (Signed)
 PHARMACY - ANTICOAGULATION CONSULT NOTE  Pharmacy Consult for Warfarin  and Lovenox bridge Indication:  On-X mechanical aortic valve ( INR goal 1.5-2 )  Allergies  Allergen Reactions   Latex Anaphylaxis, Hives and Itching    Other reaction(s): Other (See Comments) SKIN BURNING & PEELING    Nsaids Other (See Comments)    Serve ulcers; stomach aches, GERD.  Other Reaction(s): Other (See Comments)   Tomato Anaphylaxis, Hives and Itching   Gabapentin Palpitations    Has heart condition; also makes jittery   Metformin     Other Reaction(s): Abdominal Pain  Causes pancreatitis to worsen   Lisinopril     Other Reaction(s): Angioedema   Tramadol Itching and Hives   Duloxetine Anxiety    Makes her jittery    Patient Measurements: Height: 5\' 6"  (167.6 cm) IBW/kg (Calculated) : 59.3  Vital Signs: Temp: 98.1 F (36.7 C) (04/10 0751) Temp Source: Oral (04/10 0751) BP: 140/78 (04/10 0751) Pulse Rate: 69 (04/10 0751)  Labs: Recent Labs    10/23/23 0322 10/24/23 0337 10/25/23 0529  HGB 12.3 12.5  --   HCT 36.8 36.6  --   PLT 236 208  --   LABPROT 23.8* 15.5* 15.3*  INR 2.1* 1.2 1.2  CREATININE 0.63 0.58  --     Estimated Creatinine Clearance: 121.3 mL/min (by C-G formula based on SCr of 0.58 mg/dL).   Medical History: Past Medical History:  Diagnosis Date   Arthritis    Asthma    Chronic pain disorder 03/19/2018   GERD (gastroesophageal reflux disease)    Headache    Heart murmur    History of trichomoniasis 03/19/2018   Hypertension    Intractable nausea and vomiting    Pancreatitis    PID (pelvic inflammatory disease) 02/14/2018   Uterine leiomyoma 03/19/2018    Medications:  Medications Prior to Admission  Medication Sig Dispense Refill Last Dose/Taking   albuterol (VENTOLIN HFA) 108 (90 Base) MCG/ACT inhaler Inhale 1-2 puffs into the lungs every 6 (six) hours as needed.   Taking As Needed   amitriptyline (ELAVIL) 10 MG tablet Take 10 mg by mouth 3  (three) times daily as needed.   10/20/2023   amLODipine (NORVASC) 10 MG tablet Take 1 tablet (10 mg total) by mouth daily. 30 tablet 2 10/19/2023   atorvastatin (LIPITOR) 40 MG tablet Take 40 mg by mouth daily.   10/19/2023   furosemide (LASIX) 40 MG tablet Take 20 mg by mouth daily.   Taking   lipase/protease/amylase (CREON) 36000 UNITS CPEP capsule Take 1 capsule (36,000 Units total) by mouth 3 (three) times daily with meals. 90 capsule 0 10/19/2023   losartan (COZAAR) 50 MG tablet Take 1 tablet (50 mg total) by mouth daily. 30 tablet 2 10/19/2023   metFORMIN (GLUCOPHAGE) 500 MG tablet Take 500 mg by mouth every morning.   10/19/2023   methadone (DOLOPHINE) 10 MG tablet Take 10 mg by mouth 4 (four) times daily.   Past Month   naloxone (NARCAN) nasal spray 4 mg/0.1 mL Place 1 spray into the nose once as needed (to reverse overdose).   Taking As Needed   omeprazole (PRILOSEC) 40 MG capsule Take 40 mg by mouth daily.   10/19/2023   ondansetron (ZOFRAN) 4 MG tablet Take 1 tablet (4 mg total) by mouth daily as needed for nausea or vomiting. 30 tablet 1 Taking As Needed   ondansetron (ZOFRAN-ODT) 4 MG disintegrating tablet Take 1 tablet (4 mg total) by mouth every 8 (eight)  hours as needed for nausea or vomiting. 20 tablet 0 Taking As Needed   Semaglutide, 1 MG/DOSE, 4 MG/3ML SOPN Inject 0.75 mLs into the skin once a week.   Taking   spironolactone (ALDACTONE) 25 MG tablet Take 25 mg by mouth daily.   10/19/2023   traZODone (DESYREL) 50 MG tablet Take 100 mg by mouth at bedtime.   10/19/2023   warfarin (COUMADIN) 7.5 MG tablet Take 3.25-7.5 mg by mouth daily. Take 3.25mg   M/W/F/S/Sun and T/Th 7.5mg  (1 tab)   10/17/2023   APPLE CIDER VINEGAR PO Take 1 capsule by mouth 3 (three) times daily. (Patient not taking: Reported on 10/21/2023)   Not Taking   HYDROcodone-acetaminophen (NORCO/VICODIN) 5-325 MG tablet Take 1 tablet by mouth every 4 (four) hours as needed. (Patient not taking: Reported on 10/21/2023) 8 tablet 0 Not Taking    omega-3 acid ethyl esters (LOVAZA) 1 g capsule Take 1 g by mouth 2 (two) times daily. (Patient not taking: Reported on 10/21/2023)   Not Taking   Scheduled:   acetaminophen  1,000 mg Oral Q6H   amLODipine  10 mg Oral Daily   atorvastatin  40 mg Oral Daily   enoxaparin (LOVENOX) injection  1 mg/kg Subcutaneous Q12H   lipase/protease/amylase  36,000 Units Oral TID WC   losartan  50 mg Oral Daily   omega-3 acid ethyl esters  1 g Oral BID   pantoprazole  40 mg Oral Daily   spironolactone  25 mg Oral Daily   traZODone  100 mg Oral QHS   Warfarin - Pharmacist Dosing Inpatient   Does not apply q1600   Infusions:   lactated ringers 100 mL/hr at 10/25/23 0457   magnesium sulfate bolus IVPB     sodium phosphate 30 mmol in sodium chloride 0.9 % 250 mL infusion      PRN: albuterol, hydrALAZINE, magnesium hydroxide, naloxone, ondansetron **OR** ondansetron (ZOFRAN) IV, oxyCODONE Anti-infectives (From admission, onward)    None       Assessment: 43 y.o. 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 CBC stable.   Last seen at [facility] anticoagulation clinic on 09/28/23 with subtherapeutic INR of 1.1 which was below goal of 1.5-2 due to held doses of warfarin during a recent admission. At that visit she was advised to continue 7.5 mg on Thursdays and 3.75 mg all other days (TWD 30 mg).   On 10/19/23 pt was seen at [facility] anticoagulation clinic and INR 5.1 which is above goal of 1.5 to 2 due to recent decrease in appetite after resuming Ozempic. Their plan:  Per [facility] anticoag clinic note 4/4: 1. Hold warfarin for 2 days then decrease to 3.75 mg daily (TWD  26.25 mg)  Date INR Warfarin Dose  4/6 3.5 HOLD  4/7 3.0 HOLD  4/8 2.1 2.5 mg  4/9 1.2 4 mg  4/10 1.2         Goal of Therapy:  INR 1.5 - 2.0 Monitor platelets by anticoagulation protocol: Yes   Plan:  Warfarin:  INR is slightly below goal range. Will order warfarin 7.5 mg po x 1 today (per home regimen) INR daily CBC per protocol  Lovenox: Will begin Lovenox 1 mg/kg q12h for bridge until INR >/= 1.5  Monitor per protocol  Bari Mantis PharmD Clinical Pharmacist 10/25/2023

## 2023-10-25 NOTE — Progress Notes (Signed)
  PROGRESS NOTE    Sarah Sosa  ZOX:[id number] DOB: 1981-01-12 DOA: 10/21/2023 PCP: [facility]  132A/132A-AA  LOS: 3 days   Brief hospital course:   Assessment & Plan: Sarah Sosa is 43 y.o. female with asthma, GERD, osteoarthritis, chronic pain, status post aortic valve replacement mechanical prosthetic valve on Coumadin, hypertension, chronic pancreatitis, who presents to the ED with acute onset epigastric and left upper quadrant pain with associated nausea and vomiting.  In the ED blood pressure was 175/105 she had mild hypokalemia, lipase level 234, high-sensitivity troponin 60.  Alcohol level is 134.   Acute on chronic pancreatitis  -Patient reports chronic pancreatitis secondary to Ozempic.  She reports that she is still taking this medication.  She is also drinking alcohol.  She was acutely intoxicated on arrival. --advance to soft diet --d/c MIVF --cont Creon --No IV dilaudid, or IV pain meds --oxycodone PRN   Alcohol intoxication - Acutely intoxicated on arrival - Discussed the importance of alcohol abstinence especially in setting of pancreatitis   Hypokalemia Hypomagnesemia --monitor and supplement PRN   Hypertension --cont amlodipine, losartan and spironolactone   Dyslipidemia - Continue statin   Status post aortic valve replacement with mechanical valve --cont warfarin - Follow INR, pharmacy to assist in dosing --Lovenox for bridging   Type 2 diabetes without complications --A1c 6.4, well controlled --d/c'ed BG checks, no need   Chronic pain syndrome Opioid use disorder with history of pain seeking behaviors -Patient is currently on Suboxone 3 times daily at home.  Chart review reveals she was previously on methadone but this was discontinued due to continued request for increasing opioids.  She is refusing Suboxone during this admission  --No IV dilaudid  --no need for IV pain meds now   GERD --cont PPI    DVT prophylaxis:  [id number] Code Status: Full code  Family Communication:  Level of care: Med-Surg Dispo:   The patient is from: home Anticipated d/c is to: home Anticipated d/c date is: tomorrow   Subjective and Interval History:  Pt said she was ready to advance to soft diet.   Objective: Vitals:   10/24/23 2002 10/25/23 0356 10/25/23 0751 10/25/23 1532  BP: 126/79 (!) 150/90 (!) 140/78 135/85  Pulse: 74 73 69 75  Resp: 16 18 16 16   Temp: 98.7 F (37.1 C) 98.3 F (36.8 C) 98.1 F (36.7 C) 98.3 F (36.8 C)  TempSrc:   Oral Oral  SpO2: 99% 100% 99% 98%  Height:        Intake/Output Summary (Last 24 hours) at 10/25/2023 1833 Last data filed at 10/25/2023 1540 Gross per 24 hour  Intake 1859.96 ml  Output --  Net 1859.96 ml   There were no vitals filed for this visit.  Examination:   Constitutional: NAD, AAOx3 HEENT: conjunctivae and lids normal, EOMI CV: No cyanosis.   RESP: normal respiratory effort, on RA Neuro: II - XII grossly intact.     Data Reviewed: I have personally reviewed labs and imaging studies  Time spent: 35 minutes  Darlin Priestly, MD [facility] If 7PM-7AM, please contact night-coverage 10/25/2023, 6:33 PM

## 2023-10-25 NOTE — Plan of Care (Signed)

## 2023-10-25 NOTE — Plan of Care (Signed)
  Problem: Metabolic: Goal: Ability to maintain appropriate glucose levels will improve Outcome: Progressing   Problem: Clinical Measurements: Goal: Diagnostic test results will improve Outcome: Progressing   Problem: Activity: Goal: Risk for activity intolerance will decrease Outcome: Progressing   Problem: Coping: Goal: Level of anxiety will decrease Outcome: Progressing   Problem: Pain Managment: Goal: General experience of comfort will improve and/or be controlled Outcome: Progressing   Problem: Safety: Goal: Ability to remain free from injury will improve Outcome: Progressing

## 2023-10-26 DIAGNOSIS — K861 Other chronic pancreatitis: Secondary | ICD-10-CM | POA: Diagnosis not present

## 2023-10-26 DIAGNOSIS — K859 Acute pancreatitis without necrosis or infection, unspecified: Secondary | ICD-10-CM | POA: Diagnosis not present

## 2023-10-26 LAB — CBC
HCT: 35.8 % — ABNORMAL LOW (ref 36.0–46.0)
Hemoglobin: 11.9 g/dL — ABNORMAL LOW (ref 12.0–15.0)
MCH: 30.4 pg (ref 26.0–34.0)
MCHC: 33.2 g/dL (ref 30.0–36.0)
MCV: 91.3 fL (ref 80.0–100.0)
Platelets: 246 10*3/uL (ref 150–400)
RBC: 3.92 MIL/uL (ref 3.87–5.11)
RDW: 13.6 % (ref 11.5–15.5)
WBC: 4.3 10*3/uL (ref 4.0–10.5)
nRBC: 0 % (ref 0.0–0.2)

## 2023-10-26 LAB — PROTIME-INR
INR: 1.4 — ABNORMAL HIGH (ref 0.8–1.2)
Prothrombin Time: 16.9 s — ABNORMAL HIGH (ref 11.4–15.2)

## 2023-10-26 LAB — MAGNESIUM: Magnesium: 1.8 mg/dL (ref 1.7–2.4)

## 2023-10-26 LAB — PHOSPHORUS: Phosphorus: 2.7 mg/dL (ref 2.5–4.6)

## 2023-10-26 MED ORDER — FUROSEMIDE 40 MG PO TABS: 20.0000 mg | ORAL_TABLET | Freq: Every day | ORAL | Status: AC | PRN

## 2023-10-26 MED ORDER — WARFARIN SODIUM 2.5 MG PO TABS: 2.5000 mg | ORAL_TABLET | Freq: Once | ORAL | Status: DC

## 2023-10-26 MED ORDER — OXYCODONE HCL 5 MG PO TABS: 5.0000 mg | ORAL_TABLET | Freq: Four times a day (QID) | ORAL | 0 refills | Status: DC | PRN

## 2023-10-26 NOTE — Plan of Care (Signed)
  Problem: Coping: Goal: Ability to adjust to condition or change in health will improve Outcome: Progressing   Problem: Metabolic: Goal: Ability to maintain appropriate glucose levels will improve Outcome: Progressing   Problem: Nutritional: Goal: Maintenance of adequate nutrition will improve Outcome: Progressing   Problem: Clinical Measurements: Goal: Diagnostic test results will improve Outcome: Progressing   Problem: Activity: Goal: Risk for activity intolerance will decrease Outcome: Progressing   Problem: Nutrition: Goal: Adequate nutrition will be maintained Outcome: Progressing   Problem: Coping: Goal: Level of anxiety will decrease Outcome: Progressing   Problem: Pain Managment: Goal: General experience of comfort will improve and/or be controlled Outcome: Progressing   Problem: Safety: Goal: Ability to remain free from injury will improve Outcome: Progressing

## 2023-10-26 NOTE — Consult Note (Signed)
 PHARMACY - ANTICOAGULATION CONSULT NOTE  Pharmacy Consult for Warfarin  and Lovenox bridge Indication:  On-X mechanical aortic valve ( INR goal 1.5-2 )  Allergies  Allergen Reactions   Latex Anaphylaxis, Hives and Itching    Other reaction(s): Other (See Comments) SKIN BURNING & PEELING    Nsaids Other (See Comments)    Serve ulcers; stomach aches, GERD.  Other Reaction(s): Other (See Comments)   Tomato Anaphylaxis, Hives and Itching   Gabapentin Palpitations    Has heart condition; also makes jittery   Metformin     Other Reaction(s): Abdominal Pain  Causes pancreatitis to worsen   Lisinopril     Other Reaction(s): Angioedema   Tramadol Itching and Hives   Duloxetine Anxiety    Makes her jittery    Patient Measurements: Height: 5\' 6"  (167.6 cm) IBW/kg (Calculated) : 59.3  Vital Signs: Temp: 98 F (36.7 C) (04/11 0540) BP: 135/80 (04/11 0540) Pulse Rate: 65 (04/11 0540)  Labs: Recent Labs    10/24/23 0337 10/25/23 0529 10/26/23 0550  HGB 12.5  --  11.9*  HCT 36.6  --  35.8*  PLT 208  --  246  LABPROT 15.5* 15.3* 16.9*  INR 1.2 1.2 1.4*  CREATININE 0.58  --   --     Estimated Creatinine Clearance: 121.3 mL/min (by C-G formula based on SCr of 0.58 mg/dL).   Medical History: Past Medical History:  Diagnosis Date   Arthritis    Asthma    Chronic pain disorder 03/19/2018   GERD (gastroesophageal reflux disease)    Headache    Heart murmur    History of trichomoniasis 03/19/2018   Hypertension    Intractable nausea and vomiting    Pancreatitis    PID (pelvic inflammatory disease) 02/14/2018   Uterine leiomyoma 03/19/2018    Medications:  Medications Prior to Admission  Medication Sig Dispense Refill Last Dose/Taking   albuterol (VENTOLIN HFA) 108 (90 Base) MCG/ACT inhaler Inhale 1-2 puffs into the lungs every 6 (six) hours as needed.   Taking As Needed   amitriptyline (ELAVIL) 10 MG tablet Take 10 mg by mouth 3 (three) times daily as needed.    10/20/2023   amLODipine (NORVASC) 10 MG tablet Take 1 tablet (10 mg total) by mouth daily. 30 tablet 2 10/19/2023   atorvastatin (LIPITOR) 40 MG tablet Take 40 mg by mouth daily.   10/19/2023   furosemide (LASIX) 40 MG tablet Take 20 mg by mouth daily.   Taking   lipase/protease/amylase (CREON) 36000 UNITS CPEP capsule Take 1 capsule (36,000 Units total) by mouth 3 (three) times daily with meals. 90 capsule 0 10/19/2023   losartan (COZAAR) 50 MG tablet Take 1 tablet (50 mg total) by mouth daily. 30 tablet 2 10/19/2023   metFORMIN (GLUCOPHAGE) 500 MG tablet Take 500 mg by mouth every morning.   10/19/2023   methadone (DOLOPHINE) 10 MG tablet Take 10 mg by mouth 4 (four) times daily.   Past Month   naloxone (NARCAN) nasal spray 4 mg/0.1 mL Place 1 spray into the nose once as needed (to reverse overdose).   Taking As Needed   omeprazole (PRILOSEC) 40 MG capsule Take 40 mg by mouth daily.   10/19/2023   ondansetron (ZOFRAN) 4 MG tablet Take 1 tablet (4 mg total) by mouth daily as needed for nausea or vomiting. 30 tablet 1 Taking As Needed   ondansetron (ZOFRAN-ODT) 4 MG disintegrating tablet Take 1 tablet (4 mg total) by mouth every 8 (eight) hours as needed  for nausea or vomiting. 20 tablet 0 Taking As Needed   Semaglutide, 1 MG/DOSE, 4 MG/3ML SOPN Inject 0.75 mLs into the skin once a week.   Taking   spironolactone (ALDACTONE) 25 MG tablet Take 25 mg by mouth daily.   10/19/2023   traZODone (DESYREL) 50 MG tablet Take 100 mg by mouth at bedtime.   10/19/2023   warfarin (COUMADIN) 7.5 MG tablet Take 3.25-7.5 mg by mouth daily. Take 3.25mg   M/W/F/S/Sun and T/Th 7.5mg  (1 tab)   10/17/2023   APPLE CIDER VINEGAR PO Take 1 capsule by mouth 3 (three) times daily. (Patient not taking: Reported on 10/21/2023)   Not Taking   HYDROcodone-acetaminophen (NORCO/VICODIN) 5-325 MG tablet Take 1 tablet by mouth every 4 (four) hours as needed. (Patient not taking: Reported on 10/21/2023) 8 tablet 0 Not Taking   omega-3 acid ethyl esters  (LOVAZA) 1 g capsule Take 1 g by mouth 2 (two) times daily. (Patient not taking: Reported on 10/21/2023)   Not Taking   Scheduled:   acetaminophen  1,000 mg Oral Q6H   amLODipine  10 mg Oral Daily   atorvastatin  40 mg Oral Daily   enoxaparin (LOVENOX) injection  1 mg/kg Subcutaneous Q12H   lipase/protease/amylase  36,000 Units Oral TID WC   losartan  50 mg Oral Daily   omega-3 acid ethyl esters  1 g Oral BID   pantoprazole  40 mg Oral Daily   polyethylene glycol  34 g Oral BID   spironolactone  25 mg Oral Daily   traZODone  100 mg Oral QHS   Warfarin - Pharmacist Dosing Inpatient   Does not apply q1600   Infusions:     PRN: albuterol, hydrALAZINE, magnesium hydroxide, naloxone, ondansetron **OR** ondansetron (ZOFRAN) IV, oxyCODONE Anti-infectives (From admission, onward)    None       Assessment: 43 y.o. 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 CBC stable.   Last seen at [facility] anticoagulation clinic on 09/28/23 with subtherapeutic INR of 1.1 which was below goal of 1.5-2 due to held doses of warfarin during a recent admission. At that visit she was advised to continue 7.5 mg on Thursdays and 3.75 mg all other days (TWD 30 mg).   On 10/19/23 pt was seen at [facility] anticoagulation clinic and INR 5.1 which is above goal of 1.5 to 2 due to recent decrease in appetite after resuming Ozempic. Their plan:  Per [facility] anticoag clinic note 4/4: 1. Hold warfarin for 2 days then decrease to 3.75 mg daily (TWD 26.25 mg)  Date INR Warfarin Dose  4/6 3.5 HOLD  4/7 3.0 HOLD  4/8 2.1 2.5 mg  4/9 1.2 4 mg  4/10 1.2 7.5 mg  4/11 1.4 2.5 mg    Goal  of Therapy:  INR 1.5 - 2.0 Monitor platelets by anticoagulation protocol: Yes   Plan:  Warfarin: Administer warfarin 2.5 mg PO x 1 Daily INR CBC at least every 7 days on warfarin, but for now at least every 3 days on enoxaparin  Lovenox: Continue enoxaparin 1 mg/kg subQ q12H as bridge until INR therapeutic Ideally INR therapeutic x 2 days before discontinuation CBC at least every 3 days on enoxaparin  Will M. Dareen Piano, PharmD Clinical  Pharmacist 10/26/2023 7:20 AM

## 2023-10-26 NOTE — Discharge Summary (Signed)
 Physician Discharge Summary   Sarah Sosa  female DOB: 11/26/1980  IEP:[id number]  PCP: [facility]  Admit date: 10/21/2023 Discharge date: 10/26/2023  Admitted From: home Disposition:  home CODE STATUS: Full code   Hospital Course:  For full details, please see H&P, progress notes, consult notes and ancillary notes.  Briefly,  Sarah Sosa is 43 y.o. female with asthma, chronic pain, status post aortic valve replacement mechanical prosthetic valve on Coumadin, hypertension, chronic pancreatitis, who presented to the ED with acute onset epigastric and left upper quadrant pain with associated nausea and vomiting.     Acute on chronic pancreatitis  -lipase level 234.  Patient reports chronic pancreatitis secondary to Ozempic.  She reports that she is still taking this medication.  She is also drinking alcohol.  She was acutely intoxicated on arrival. --pt received MIVF, and diet advanced to soft diet prior to discharge.   Alcohol intoxication - Acutely intoxicated on arrival.  Alcohol level is 134.    Hypokalemia Hypomagnesemia --monitored and supplemented PRN   Hypertension --cont amlodipine, losartan and spironolactone   Dyslipidemia - Continue statin   Status post aortic valve replacement with mechanical valve --INR 1.4 prior to discharge.  Pt said her INR goal is 1.5-2.5, and she declined Lovenox for bridging. --cont home Warfarin   Type 2 diabetes without complications --A1c 6.4, well controlled --resume home regimen as below after discharge.  Chronic pain syndrome Opioid use disorder with pain med seeking behaviors -Patient is currently on Suboxone 3 times daily at home.  Chart review reveals she was previously on methadone but this was discontinued due to continued request for increasing opioids.  She is refusing Suboxone during this admission  --should avoid IV dilaudid in the future   GERD --cont PPI   Unless noted above, medications under  "STOP" list are ones pt was not taking PTA.  Discharge Diagnoses:  Principal Problem:   Acute on chronic pancreatitis (HCC) Active Problems:   Pancreatitis   Alcohol intoxication (HCC)   Hypokalemia   Essential hypertension   Dyslipidemia   Status post aortic valve replacement with metallic valve   Chronic pain syndrome   Type 2 diabetes mellitus without complications (HCC)   30 Day Unplanned Readmission Risk Score    Flowsheet Row ED to Hosp-Admission (Current) from 10/21/2023 in [facility] (1A)  30 Day Unplanned Readmission Risk Score (%) 43.3 Filed at 10/26/2023 0401       This score is the patient's risk of an unplanned readmission within 30 days of being discharged (0 -100%). The score is based on dignosis, age, lab data, medications, orders, and past utilization.   Low:  0-14.9   Medium: 15-21.9   High: 22-29.9   Extreme: 30 and above         Discharge Instructions:  Allergies as of 10/26/2023       Reactions   Latex Anaphylaxis, Hives, Itching   Other reaction(s): Other (See Comments) SKIN BURNING & PEELING   Nsaids Other (See Comments)   Serve ulcers; stomach aches, GERD. Other Reaction(s): Other (See Comments)   Tomato Anaphylaxis, Hives, Itching   Gabapentin Palpitations   Has heart condition; also makes jittery   Metformin    Other Reaction(s): Abdominal Pain Causes pancreatitis to worsen   Lisinopril    Other Reaction(s): Angioedema   Tramadol Itching, Hives   Duloxetine Anxiety   Makes her jittery        Medication List  STOP taking these medications    APPLE CIDER VINEGAR PO   HYDROcodone-acetaminophen 5-325 MG tablet Commonly known as: NORCO/VICODIN   omega-3 acid ethyl esters 1 g capsule Commonly known as: LOVAZA   ondansetron 4 MG disintegrating tablet Commonly known as: ZOFRAN-ODT       TAKE these medications    albuterol 108 (90 Base) MCG/ACT inhaler Commonly known as: VENTOLIN  HFA Inhale 1-2 puffs into the lungs every 6 (six) hours as needed.   amitriptyline 10 MG tablet Commonly known as: ELAVIL Take 10 mg by mouth 3 (three) times daily as needed.   amLODipine 10 MG tablet Commonly known as: NORVASC Take 1 tablet (10 mg total) by mouth daily.   atorvastatin 40 MG tablet Commonly known as: LIPITOR Take 40 mg by mouth daily.   furosemide 40 MG tablet Commonly known as: LASIX Take 0.5 tablets (20 mg total) by mouth daily as needed. Home med. What changed:  when to take this reasons to take this additional instructions   lipase/protease/amylase 81191 UNITS Cpep capsule Commonly known as: CREON Take 1 capsule (36,000 Units total) by mouth 3 (three) times daily with meals.   losartan 50 MG tablet Commonly known as: COZAAR Take 1 tablet (50 mg total) by mouth daily.   metFORMIN 500 MG tablet Commonly known as: GLUCOPHAGE Take 500 mg by mouth every morning.   methadone 10 MG tablet Commonly known as: DOLOPHINE Take 10 mg by mouth 4 (four) times daily.   naloxone 4 MG/0.1ML Liqd nasal spray kit Commonly known as: NARCAN Place 1 spray into the nose once as needed (to reverse overdose).   omeprazole 40 MG capsule Commonly known as: PRILOSEC Take 40 mg by mouth daily.   ondansetron 4 MG tablet Commonly known as: Zofran Take 1 tablet (4 mg total) by mouth daily as needed for nausea or vomiting.   oxyCODONE 5 MG immediate release tablet Commonly known as: Oxy IR/ROXICODONE Take 1 tablet (5 mg total) by mouth every 6 (six) hours as needed for severe pain (pain score 7-10).   Semaglutide (1 MG/DOSE) 4 MG/3ML Sopn Inject 0.75 mLs into the skin once a week.   spironolactone 25 MG tablet Commonly known as: ALDACTONE Take 25 mg by mouth daily.   traZODone 50 MG tablet Commonly known as: DESYREL Take 100 mg by mouth at bedtime.   warfarin 7.5 MG tablet Commonly known as: COUMADIN Take 3.25-7.5 mg by mouth daily. Take 3.25mg   M/W/F/S/Sun and  T/Th 7.5mg  (1 tab)         Follow-up Information     [facility] Follow up in 1 week(s).   Contact information: [address] [place] [address] [phone]                 Allergies  Allergen Reactions   Latex Anaphylaxis, Hives and Itching    Other reaction(s): Other (See Comments) SKIN BURNING & PEELING    Nsaids Other (See Comments)    Serve ulcers; stomach aches, GERD.  Other Reaction(s): Other (See Comments)   Tomato Anaphylaxis, Hives and Itching   Gabapentin Palpitations    Has heart condition; also makes jittery   Metformin     Other Reaction(s): Abdominal Pain  Causes pancreatitis to worsen   Lisinopril     Other Reaction(s): Angioedema   Tramadol Itching and Hives   Duloxetine Anxiety    Makes her jittery     The results of significant diagnostics from this hospitalization (including imaging,  microbiology, ancillary and laboratory) are listed below for reference.   Consultations:   Procedures/Studies: CT ABDOMEN PELVIS W CONTRAST Result Date: 10/14/2023 CLINICAL DATA:  Acute, nonlocalized abdominal pain EXAM: CT ABDOMEN AND PELVIS WITH CONTRAST TECHNIQUE: Multidetector CT imaging of the abdomen and pelvis was performed using the standard protocol following bolus administration of intravenous contrast. RADIATION DOSE REDUCTION: This exam was performed according to the departmental dose-optimization program which includes automated exposure control, adjustment of the mA and/or kV according to patient size and/or use of iterative reconstruction technique. CONTRAST:  OMNIPAQUE IOHEXOL 300 MG/ML  SOLN COMPARISON:  09/17/2023 FINDINGS: Lower chest: Aortic valve replacement partially covered. No acute finding Hepatobiliary: No focal liver abnormality.Cholecystectomy. Pancreas: Generalized atrophy for age. Acute inflammatory changes on prior have resolved. Spleen: Unremarkable. Adrenals/Urinary Tract: Negative adrenals. No  hydronephrosis or stone. Unremarkable bladder. Stomach/Bowel:  No obstruction. No appendicitis. Vascular/Lymphatic: No acute vascular abnormality. Premature atheromatous calcification of the aorta and iliacs. No mass or adenopathy. Reproductive:Hysterectomy with symmetric adnexa. Other: No ascites or pneumoperitoneum. Twisted and somewhat hazy ileocolic mesentery is unchanged and likely from old insult. Musculoskeletal: No acute abnormalities. Degenerative spurring in the lumbar facets with sclerosis and spurring at the bilateral sacroiliac joints. IMPRESSION: No acute finding.  Resolved pancreatitis finding seen on prior. Electronically Signed   By: Tiburcio Pea M.D.   On: 10/14/2023 07:48      Labs: BNP (last 3 results) No results for input(s): "BNP" in the last 8760 hours. Basic Metabolic Panel: Recent Labs  Lab 10/20/23 2359 10/21/23 0504 10/22/23 0446 10/23/23 0322 10/24/23 0337 10/25/23 0529 10/26/23 0550  NA 140 134* 136 137 135  --   --   K 3.4* 4.3 3.9 4.0 3.7  --   --   CL 103 102 107 107 105  --   --   CO2 24 22 24 24 24   --   --   GLUCOSE 94 90 95 101* 90  --   --   BUN 5* <5* <5* <5* <5*  --   --   CREATININE 0.58 0.47 0.62 0.63 0.58  --   --   CALCIUM 8.7* 8.0* 8.5* 9.0 8.9  --   --   MG 1.5*  --  1.7 1.8 1.5* 1.5* 1.8  PHOS  --   --  1.8* 2.5 2.4* 2.2* 2.7   Liver Function Tests: Recent Labs  Lab 10/20/23 2359 10/22/23 0446 10/23/23 0322 10/24/23 0337  AST 26 17 13* 14*  ALT 24 15 14 11   ALKPHOS 89 74 73 67  BILITOT 0.5 0.9 1.0 1.1  PROT 8.2* 6.3* 6.6 6.5  ALBUMIN 4.2 3.3* 3.4* 3.4*   Recent Labs  Lab 10/20/23 2359 10/21/23 0504 10/22/23 0446 10/23/23 0322 10/24/23 0337  LIPASE 234* 97* 43 28 27   No results for input(s): "AMMONIA" in the last 168 hours. CBC: Recent Labs  Lab 10/21/23 0504 10/22/23 0446 10/23/23 0322 10/24/23 0337 10/26/23 0550  WBC 7.4 5.0 4.2 3.8* 4.3  NEUTROABS  --  2.7 2.1 1.4*  --   HGB 13.4 12.0 12.3 12.5 11.9*   HCT 39.4 35.0* 36.8 36.6 35.8*  MCV 90.0 90.7 90.2 89.5 91.3  PLT 249 222 236 208 246   Cardiac Enzymes: No results for input(s): "CKTOTAL", "CKMB", "CKMBINDEX", "TROPONINI" in the last 168 hours. BNP: Invalid input(s): "POCBNP" CBG: Recent Labs  Lab 10/23/23 0815 10/23/23 1126 10/23/23 1721 10/24/23 0008 10/24/23 0448  GLUCAP 105* 153* 122* 133* 111*   D-Dimer No  results for input(s): "DDIMER" in the last 72 hours. Hgb A1c No results for input(s): "HGBA1C" in the last 72 hours. Lipid Profile No results for input(s): "CHOL", "HDL", "LDLCALC", "TRIG", "CHOLHDL", "LDLDIRECT" in the last 72 hours. Thyroid function studies No results for input(s): "TSH", "T4TOTAL", "T3FREE", "THYROIDAB" in the last 72 hours.  Invalid input(s): "FREET3" Anemia work up No results for input(s): "VITAMINB12", "FOLATE", "FERRITIN", "TIBC", "IRON", "RETICCTPCT" in the last 72 hours. Urinalysis    Component Value Date/Time   COLORURINE YELLOW (A) 10/22/2023 0500   APPEARANCEUR CLOUDY (A) 10/22/2023 0500   LABSPEC 1.010 10/22/2023 0500   PHURINE 6.0 10/22/2023 0500   GLUCOSEU NEGATIVE 10/22/2023 0500   HGBUR MODERATE (A) 10/22/2023 0500   BILIRUBINUR NEGATIVE 10/22/2023 0500   KETONESUR NEGATIVE 10/22/2023 0500   PROTEINUR NEGATIVE 10/22/2023 0500   NITRITE NEGATIVE 10/22/2023 0500   LEUKOCYTESUR NEGATIVE 10/22/2023 0500   Sepsis Labs Recent Labs  Lab 10/22/23 0446 10/23/23 0322 10/24/23 0337 10/26/23 0550  WBC 5.0 4.2 3.8* 4.3   Microbiology No results found for this or any previous visit (from the past 240 hours).   Total time spend on discharging this patient, including the last patient exam, discussing the hospital stay, instructions for ongoing care as it relates to all pertinent caregivers, as well as preparing the medical discharge records, prescriptions, and/or referrals as applicable, is 35 minutes.    Darlin Priestly, MD  [facility] 10/26/2023, 7:51 AM

## 2023-11-04 ENCOUNTER — Other Ambulatory Visit: Payer: Self-pay

## 2023-11-04 ENCOUNTER — Inpatient Hospital Stay
Admission: EM | Admit: 2023-11-04 | Discharge: 2023-11-09 | DRG: 439 | Disposition: A | Discharge status: Home / Self Care | Attending: Internal Medicine | Admitting: Internal Medicine

## 2023-11-04 DIAGNOSIS — R000 Tachycardia, unspecified: Secondary | ICD-10-CM | POA: Diagnosis present

## 2023-11-04 DIAGNOSIS — Z765 Malingerer [conscious simulation]: Secondary | ICD-10-CM

## 2023-11-04 DIAGNOSIS — R112 Nausea with vomiting, unspecified: Secondary | ICD-10-CM

## 2023-11-04 DIAGNOSIS — E119 Type 2 diabetes mellitus without complications: Secondary | ICD-10-CM

## 2023-11-04 DIAGNOSIS — F1911 Other psychoactive substance abuse, in remission: Secondary | ICD-10-CM | POA: Diagnosis present

## 2023-11-04 DIAGNOSIS — K860 Alcohol-induced chronic pancreatitis: Secondary | ICD-10-CM | POA: Diagnosis present

## 2023-11-04 DIAGNOSIS — I350 Nonrheumatic aortic (valve) stenosis: Secondary | ICD-10-CM | POA: Diagnosis present

## 2023-11-04 DIAGNOSIS — Z888 Allergy status to other drugs, medicaments and biological substances status: Secondary | ICD-10-CM

## 2023-11-04 DIAGNOSIS — Z87898 Personal history of other specified conditions: Secondary | ICD-10-CM

## 2023-11-04 DIAGNOSIS — K219 Gastro-esophageal reflux disease without esophagitis: Secondary | ICD-10-CM | POA: Diagnosis present

## 2023-11-04 DIAGNOSIS — Z79899 Other long term (current) drug therapy: Secondary | ICD-10-CM

## 2023-11-04 DIAGNOSIS — Y906 Blood alcohol level of 120-199 mg/100 ml: Secondary | ICD-10-CM | POA: Diagnosis present

## 2023-11-04 DIAGNOSIS — Z9049 Acquired absence of other specified parts of digestive tract: Secondary | ICD-10-CM

## 2023-11-04 DIAGNOSIS — I10 Essential (primary) hypertension: Secondary | ICD-10-CM | POA: Diagnosis present

## 2023-11-04 DIAGNOSIS — E876 Hypokalemia: Secondary | ICD-10-CM | POA: Diagnosis present

## 2023-11-04 DIAGNOSIS — J4520 Mild intermittent asthma, uncomplicated: Secondary | ICD-10-CM | POA: Diagnosis present

## 2023-11-04 DIAGNOSIS — F1290 Cannabis use, unspecified, uncomplicated: Secondary | ICD-10-CM | POA: Diagnosis present

## 2023-11-04 DIAGNOSIS — F10129 Alcohol abuse with intoxication, unspecified: Secondary | ICD-10-CM | POA: Diagnosis present

## 2023-11-04 DIAGNOSIS — Z91018 Allergy to other foods: Secondary | ICD-10-CM

## 2023-11-04 DIAGNOSIS — I5032 Chronic diastolic (congestive) heart failure: Secondary | ICD-10-CM | POA: Diagnosis present

## 2023-11-04 DIAGNOSIS — Z8261 Family history of arthritis: Secondary | ICD-10-CM

## 2023-11-04 DIAGNOSIS — Z7985 Long-term (current) use of injectable non-insulin antidiabetic drugs: Secondary | ICD-10-CM

## 2023-11-04 DIAGNOSIS — K853 Drug induced acute pancreatitis without necrosis or infection: Secondary | ICD-10-CM | POA: Diagnosis not present

## 2023-11-04 DIAGNOSIS — Z952 Presence of prosthetic heart valve: Secondary | ICD-10-CM

## 2023-11-04 DIAGNOSIS — Z6841 Body Mass Index (BMI) 40.0 and over, adult: Secondary | ICD-10-CM

## 2023-11-04 DIAGNOSIS — K852 Alcohol induced acute pancreatitis without necrosis or infection: Principal | ICD-10-CM | POA: Diagnosis present

## 2023-11-04 DIAGNOSIS — Z8619 Personal history of other infectious and parasitic diseases: Secondary | ICD-10-CM

## 2023-11-04 DIAGNOSIS — G894 Chronic pain syndrome: Secondary | ICD-10-CM | POA: Diagnosis present

## 2023-11-04 DIAGNOSIS — Z885 Allergy status to narcotic agent status: Secondary | ICD-10-CM

## 2023-11-04 DIAGNOSIS — G8929 Other chronic pain: Principal | ICD-10-CM

## 2023-11-04 DIAGNOSIS — Z91040 Latex allergy status: Secondary | ICD-10-CM

## 2023-11-04 DIAGNOSIS — K7010 Alcoholic hepatitis without ascites: Secondary | ICD-10-CM | POA: Diagnosis present

## 2023-11-04 DIAGNOSIS — I110 Hypertensive heart disease with heart failure: Secondary | ICD-10-CM | POA: Diagnosis present

## 2023-11-04 DIAGNOSIS — J4489 Other specified chronic obstructive pulmonary disease: Secondary | ICD-10-CM | POA: Diagnosis present

## 2023-11-04 DIAGNOSIS — F17210 Nicotine dependence, cigarettes, uncomplicated: Secondary | ICD-10-CM | POA: Diagnosis present

## 2023-11-04 DIAGNOSIS — M1990 Unspecified osteoarthritis, unspecified site: Secondary | ICD-10-CM | POA: Diagnosis present

## 2023-11-04 DIAGNOSIS — E66813 Obesity, class 3: Secondary | ICD-10-CM | POA: Diagnosis present

## 2023-11-04 DIAGNOSIS — F1120 Opioid dependence, uncomplicated: Secondary | ICD-10-CM | POA: Diagnosis present

## 2023-11-04 DIAGNOSIS — Z7984 Long term (current) use of oral hypoglycemic drugs: Secondary | ICD-10-CM

## 2023-11-04 DIAGNOSIS — Z720 Tobacco use: Secondary | ICD-10-CM | POA: Diagnosis present

## 2023-11-04 DIAGNOSIS — Z90710 Acquired absence of both cervix and uterus: Secondary | ICD-10-CM

## 2023-11-04 DIAGNOSIS — Z886 Allergy status to analgesic agent status: Secondary | ICD-10-CM

## 2023-11-04 DIAGNOSIS — Z8249 Family history of ischemic heart disease and other diseases of the circulatory system: Secondary | ICD-10-CM

## 2023-11-04 DIAGNOSIS — Z7901 Long term (current) use of anticoagulants: Secondary | ICD-10-CM

## 2023-11-04 DIAGNOSIS — K859 Acute pancreatitis without necrosis or infection, unspecified: Secondary | ICD-10-CM | POA: Diagnosis present

## 2023-11-04 LAB — CBC WITH DIFFERENTIAL/PLATELET
Abs Immature Granulocytes: 0.02 K/uL (ref 0.00–0.07)
Basophils Absolute: 0.1 K/uL (ref 0.0–0.1)
Basophils Relative: 1 %
Eosinophils Absolute: 0.2 K/uL (ref 0.0–0.5)
Eosinophils Relative: 2 %
HCT: 44.1 % (ref 36.0–46.0)
Hemoglobin: 14.9 g/dL (ref 12.0–15.0)
Immature Granulocytes: 0 %
Lymphocytes Relative: 34 %
Lymphs Abs: 2.5 K/uL (ref 0.7–4.0)
MCH: 30.8 pg (ref 26.0–34.0)
MCHC: 33.8 g/dL (ref 30.0–36.0)
MCV: 91.1 fL (ref 80.0–100.0)
Monocytes Absolute: 0.4 K/uL (ref 0.1–1.0)
Monocytes Relative: 6 %
Neutro Abs: 4.3 K/uL (ref 1.7–7.7)
Neutrophils Relative %: 57 %
Platelets: 350 K/uL (ref 150–400)
RBC: 4.84 MIL/uL (ref 3.87–5.11)
RDW: 13.4 % (ref 11.5–15.5)
WBC: 7.5 K/uL (ref 4.0–10.5)
nRBC: 0 % (ref 0.0–0.2)

## 2023-11-04 LAB — URINALYSIS, ROUTINE W REFLEX MICROSCOPIC
Bilirubin Urine: NEGATIVE
Glucose, UA: NEGATIVE mg/dL
Ketones, ur: 5 mg/dL — AB
Leukocytes,Ua: NEGATIVE
Nitrite: NEGATIVE
Protein, ur: 100 mg/dL — AB
Specific Gravity, Urine: 1.02 (ref 1.005–1.030)
pH: 5 (ref 5.0–8.0)

## 2023-11-04 LAB — LIPASE, BLOOD: Lipase: 58 U/L — ABNORMAL HIGH (ref 11–51)

## 2023-11-04 LAB — COMPREHENSIVE METABOLIC PANEL WITH GFR
ALT: 12 U/L (ref 0–44)
AST: 23 U/L (ref 15–41)
Albumin: 4.4 g/dL (ref 3.5–5.0)
Alkaline Phosphatase: 78 U/L (ref 38–126)
Anion gap: 18 — ABNORMAL HIGH (ref 5–15)
BUN: 6 mg/dL (ref 6–20)
CO2: 21 mmol/L — ABNORMAL LOW (ref 22–32)
Calcium: 9.1 mg/dL (ref 8.9–10.3)
Chloride: 102 mmol/L (ref 98–111)
Creatinine, Ser: 0.64 mg/dL (ref 0.44–1.00)
GFR, Estimated: >60 mL/min (ref >60)
Glucose, Bld: 131 mg/dL — ABNORMAL HIGH (ref 70–99)
Potassium: 2.9 mmol/L — ABNORMAL LOW (ref 3.5–5.1)
Sodium: 141 mmol/L (ref 135–145)
Total Bilirubin: 1.4 mg/dL — ABNORMAL HIGH (ref 0.0–1.2)
Total Protein: 8 g/dL (ref 6.5–8.1)

## 2023-11-04 LAB — URINE DRUG SCREEN, QUALITATIVE (ARMC ONLY)
Amphetamines, Ur Screen: NOT DETECTED
Barbiturates, Ur Screen: NOT DETECTED
Benzodiazepine, Ur Scrn: NOT DETECTED
Cannabinoid 50 Ng, Ur ~~LOC~~: POSITIVE — AB
Cocaine Metabolite,Ur ~~LOC~~: NOT DETECTED
MDMA (Ecstasy)Ur Screen: NOT DETECTED
Methadone Scn, Ur: POSITIVE — AB
Opiate, Ur Screen: POSITIVE — AB
Phencyclidine (PCP) Ur S: NOT DETECTED
Tricyclic, Ur Screen: POSITIVE — AB

## 2023-11-04 LAB — CBG MONITORING, ED: Glucose-Capillary: 131 mg/dL — ABNORMAL HIGH (ref 70–99)

## 2023-11-04 LAB — GLUCOSE, CAPILLARY: Glucose-Capillary: 112 mg/dL — ABNORMAL HIGH (ref 70–99)

## 2023-11-04 LAB — PHOSPHORUS: Phosphorus: 2.4 mg/dL — ABNORMAL LOW (ref 2.5–4.6)

## 2023-11-04 LAB — ETHANOL: Alcohol, Ethyl (B): 161 mg/dL — ABNORMAL HIGH (ref <10)

## 2023-11-04 LAB — MAGNESIUM: Magnesium: 1.5 mg/dL — ABNORMAL LOW (ref 1.7–2.4)

## 2023-11-04 LAB — PROTIME-INR
INR: 1.2 (ref 0.8–1.2)
Prothrombin Time: 15.6 s — ABNORMAL HIGH (ref 11.4–15.2)

## 2023-11-04 MED ORDER — ADULT MULTIVITAMIN W/MINERALS CH
1.0000 | ORAL_TABLET | Freq: Every day | ORAL | Status: DC
  Administered 2023-11-04: 1 via ORAL
  Filled 2023-11-04 (×5): qty 1

## 2023-11-04 MED ORDER — INSULIN ASPART 100 UNIT/ML IJ SOLN
0.0000 [IU] | Freq: Three times a day (TID) | INTRAMUSCULAR | Status: DC
  Administered 2023-11-04 – 2023-11-06 (×3): 3 [IU] via SUBCUTANEOUS
  Administered 2023-11-06: 4 [IU] via SUBCUTANEOUS
  Administered 2023-11-06: 3 [IU] via SUBCUTANEOUS
  Administered 2023-11-07: 4 [IU] via SUBCUTANEOUS
  Administered 2023-11-07 – 2023-11-08 (×2): 3 [IU] via SUBCUTANEOUS
  Filled 2023-11-04 (×7): qty 1

## 2023-11-04 MED ORDER — DIPHENHYDRAMINE HCL 50 MG/ML IJ SOLN
12.5000 mg | INTRAMUSCULAR | Status: AC
  Administered 2023-11-04: 12.5 mg via INTRAVENOUS
  Filled 2023-11-04: qty 1

## 2023-11-04 MED ORDER — WARFARIN - PHARMACIST DOSING INPATIENT
Freq: Every day | Status: DC
Start: 2023-11-04 — End: 2023-11-09
  Filled 2023-11-04: qty 1

## 2023-11-04 MED ORDER — LOSARTAN POTASSIUM 50 MG PO TABS
50.0000 mg | ORAL_TABLET | Freq: Every day | ORAL | Status: DC
Start: 2023-11-04 — End: 2023-11-09
  Administered 2023-11-04 – 2023-11-09 (×6): 50 mg via ORAL
  Filled 2023-11-04 (×6): qty 1

## 2023-11-04 MED ORDER — LORAZEPAM 1 MG PO TABS: 1.0000 mg | ORAL_TABLET | ORAL | Status: AC | PRN

## 2023-11-04 MED ORDER — HYDROMORPHONE HCL 1 MG/ML IJ SOLN
0.5000 mg | INTRAMUSCULAR | Status: DC | PRN
  Administered 2023-11-04 – 2023-11-05 (×10): 1 mg via INTRAVENOUS
  Filled 2023-11-04 (×10): qty 1

## 2023-11-04 MED ORDER — POTASSIUM CHLORIDE 10 MEQ/100ML IV SOLN
10.0000 meq | Freq: Once | INTRAVENOUS | Status: AC
Start: 2023-11-04 — End: 2023-11-04
  Administered 2023-11-04: 10 meq via INTRAVENOUS
  Filled 2023-11-04: qty 100

## 2023-11-04 MED ORDER — LACTATED RINGERS IV BOLUS
1000.0000 mL | Freq: Once | INTRAVENOUS | Status: AC
  Administered 2023-11-04: 1000 mL via INTRAVENOUS

## 2023-11-04 MED ORDER — ONDANSETRON HCL 4 MG PO TABS: 4.0000 mg | ORAL_TABLET | Freq: Every day | ORAL | Status: DC | PRN

## 2023-11-04 MED ORDER — MAGNESIUM SULFATE 2 GM/50ML IV SOLN
2.0000 g | Freq: Once | INTRAVENOUS | Status: AC
  Administered 2023-11-04: 2 g via INTRAVENOUS
  Filled 2023-11-04: qty 50

## 2023-11-04 MED ORDER — DROPERIDOL 2.5 MG/ML IJ SOLN
2.5000 mg | Freq: Once | INTRAMUSCULAR | Status: AC
  Administered 2023-11-04: 2.5 mg via INTRAVENOUS
  Filled 2023-11-04: qty 2

## 2023-11-04 MED ORDER — LORAZEPAM 2 MG/ML IJ SOLN: 1.0000 mg | INTRAMUSCULAR | Status: AC | PRN

## 2023-11-04 MED ORDER — HYDROMORPHONE HCL 1 MG/ML IJ SOLN
2.0000 mg | Freq: Once | INTRAMUSCULAR | Status: AC
  Administered 2023-11-04: 2 mg via INTRAVENOUS
  Filled 2023-11-04: qty 2

## 2023-11-04 MED ORDER — THIAMINE MONONITRATE 100 MG PO TABS
100.0000 mg | ORAL_TABLET | Freq: Every day | ORAL | Status: DC
  Administered 2023-11-05 – 2023-11-09 (×5): 100 mg via ORAL
  Filled 2023-11-04 (×6): qty 1

## 2023-11-04 MED ORDER — ATORVASTATIN CALCIUM 20 MG PO TABS
40.0000 mg | ORAL_TABLET | Freq: Every evening | ORAL | Status: DC
  Administered 2023-11-04 – 2023-11-08 (×5): 40 mg via ORAL
  Filled 2023-11-04 (×5): qty 2

## 2023-11-04 MED ORDER — ALBUTEROL SULFATE (2.5 MG/3ML) 0.083% IN NEBU: 3.0000 mL | INHALATION_SOLUTION | Freq: Four times a day (QID) | RESPIRATORY_TRACT | Status: DC | PRN

## 2023-11-04 MED ORDER — ONDANSETRON HCL 4 MG/2ML IJ SOLN
4.0000 mg | Freq: Four times a day (QID) | INTRAMUSCULAR | Status: DC | PRN
  Administered 2023-11-04 – 2023-11-09 (×11): 4 mg via INTRAVENOUS
  Filled 2023-11-04 (×11): qty 2

## 2023-11-04 MED ORDER — FOLIC ACID 1 MG PO TABS
1.0000 mg | ORAL_TABLET | Freq: Every day | ORAL | Status: DC
  Administered 2023-11-04 – 2023-11-09 (×6): 1 mg via ORAL
  Filled 2023-11-04 (×6): qty 1

## 2023-11-04 MED ORDER — WARFARIN SODIUM 7.5 MG PO TABS
7.5000 mg | ORAL_TABLET | Freq: Once | ORAL | Status: AC
  Administered 2023-11-04: 7.5 mg via ORAL
  Filled 2023-11-04: qty 1

## 2023-11-04 MED ORDER — OXYCODONE HCL 5 MG PO TABS
5.0000 mg | ORAL_TABLET | Freq: Four times a day (QID) | ORAL | Status: DC | PRN
  Administered 2023-11-04 – 2023-11-09 (×10): 5 mg via ORAL
  Filled 2023-11-04 (×10): qty 1

## 2023-11-04 MED ORDER — PANTOPRAZOLE SODIUM 40 MG PO TBEC
40.0000 mg | DELAYED_RELEASE_TABLET | Freq: Every day | ORAL | Status: DC
  Administered 2023-11-04 – 2023-11-09 (×6): 40 mg via ORAL
  Filled 2023-11-04 (×6): qty 1

## 2023-11-04 MED ORDER — TRAZODONE HCL 100 MG PO TABS
100.0000 mg | ORAL_TABLET | Freq: Every day | ORAL | Status: DC
  Administered 2023-11-04 – 2023-11-08 (×5): 100 mg via ORAL
  Filled 2023-11-04 (×5): qty 1

## 2023-11-04 MED ORDER — METHADONE HCL 10 MG PO TABS: 10.0000 mg | ORAL_TABLET | Freq: Four times a day (QID) | ORAL | Status: DC

## 2023-11-04 MED ORDER — AMITRIPTYLINE HCL 10 MG PO TABS: 10.0000 mg | ORAL_TABLET | Freq: Three times a day (TID) | ORAL | Status: DC | PRN

## 2023-11-04 MED ORDER — POTASSIUM CHLORIDE CRYS ER 20 MEQ PO TBCR
40.0000 meq | EXTENDED_RELEASE_TABLET | Freq: Once | ORAL | Status: DC
  Filled 2023-11-04: qty 2

## 2023-11-04 MED ORDER — PANCRELIPASE (LIP-PROT-AMYL) 36000-114000 UNITS PO CPEP
36000.0000 [IU] | ORAL_CAPSULE | Freq: Three times a day (TID) | ORAL | Status: DC
  Administered 2023-11-07 – 2023-11-09 (×6): 36000 [IU] via ORAL
  Filled 2023-11-04 (×9): qty 1

## 2023-11-04 MED ORDER — INSULIN ASPART 100 UNIT/ML IJ SOLN: 0.0000 [IU] | Freq: Every day | INTRAMUSCULAR | Status: DC

## 2023-11-04 MED ORDER — THIAMINE HCL 100 MG/ML IJ SOLN
100.0000 mg | Freq: Every day | INTRAMUSCULAR | Status: DC
  Administered 2023-11-04: 100 mg via INTRAVENOUS
  Filled 2023-11-04: qty 2

## 2023-11-04 MED ORDER — SPIRONOLACTONE 25 MG PO TABS
25.0000 mg | ORAL_TABLET | Freq: Every day | ORAL | Status: DC
  Administered 2023-11-04 – 2023-11-09 (×6): 25 mg via ORAL
  Filled 2023-11-04 (×6): qty 1

## 2023-11-04 MED ORDER — AMLODIPINE BESYLATE 10 MG PO TABS
10.0000 mg | ORAL_TABLET | Freq: Every day | ORAL | Status: DC
  Administered 2023-11-04 – 2023-11-09 (×6): 10 mg via ORAL
  Filled 2023-11-04 (×3): qty 1
  Filled 2023-11-04: qty 2
  Filled 2023-11-04 (×2): qty 1

## 2023-11-04 NOTE — Consult Note (Signed)
 PHARMACY - ANTICOAGULATION CONSULT NOTE  Pharmacy Consult for warfarin dosing Indication: aortic valve replacement with On-X  Allergies  Allergen Reactions   Latex Anaphylaxis, Hives and Itching    Other reaction(s): Other (See Comments) SKIN BURNING & PEELING    Nsaids Other (See Comments)    Serve ulcers; stomach aches, GERD.  Other Reaction(s): Other (See Comments)   Tomato Anaphylaxis, Hives and Itching   Gabapentin  Palpitations    Has heart condition; also makes jittery   Metformin      Other Reaction(s): Abdominal Pain  Causes pancreatitis to worsen   Lisinopril      Other Reaction(s): Angioedema   Tramadol Itching and Hives   Duloxetine Anxiety    Makes her jittery    Patient Measurements: Height: 5\' 6"  (167.6 cm) Weight: 120.6 kg (265 lb 14 oz) IBW/kg (Calculated) : 59.3 HEPARIN  DW (KG): 88.1  Vital Signs: Temp: 98.6 F (37 C) (04/20 1027) Temp Source: Oral (04/20 1027) BP: 143/82 (04/20 1323) Pulse Rate: 73 (04/20 1030)  Labs: Recent Labs    11/04/23 0629 11/04/23 1312  HGB 14.9  --   HCT 44.1  --   PLT 350  --   LABPROT  --  15.6*  INR  --  1.2  CREATININE 0.64  --     Estimated Creatinine Clearance: 121.2 mL/min (by C-G formula based on SCr of 0.64 mg/dL).   Medical History: Past Medical History:  Diagnosis Date   Arthritis    Asthma    Chronic pain disorder 03/19/2018   GERD (gastroesophageal reflux disease)    Headache    Heart murmur    History of trichomoniasis 03/19/2018   Hypertension    Intractable nausea and vomiting    Pancreatitis    PID (pelvic inflammatory disease) 02/14/2018   Uterine leiomyoma 03/19/2018    Medications:  Per last Anticoagulation clinic visit on 10/19/23 warfarin dose reduced to 3.375 mg daily  Assessment: 43 yo female presenting to the ED with abdominal pain.  PMH includes asthma, chronic pancreatitis, chronic pain syndrome, AVR on chronic anticoagulation, T2DM, HTN, and HFpEF.  Patient being admitted  for treatment of acute on chronic pancreatitis.  Goal of Therapy:  INR 1.5-2.0 per Anticoag clinic notes Monitor platelets by anticoagulation protocol: Yes    Date INR Warfarin Dose  4/10 1.2 7.5 mg         Plan:  INR is subtherapeutic at 1.2 Give warfarin 7.5 mg po x 1 Daily INR while on inpatient and CBC at least weekly  Ramonita Burow, PharmD 11/04/2023,1:50 PM

## 2023-11-04 NOTE — Discharge Instructions (Addendum)
 [facility] [address] [place], [place] [address] Phone number: [phone] Fax number: [phone] Walk-in Clinic: Monday- Friday 8:00 AM-8:00 PM (Crisis Individuals) Monday, Wednesday. Friday: 8:00 AM-3:30 PM (For assessments) Hospital Follow-Up (Schedule appointments)   [facility] [address] [place], [place] [address] Phone Number: [phone] Walk-in-Clinic: Monday- Friday 9:00 AM-4:00 PM  [facility] [address] [place], [place] [address] Phone number: [phone]  [facility] (Alcohol Treatment Program in [place]) [address] [place], [place] [address] Phone number: [phone] Fax number: [phone]  [facility] (Al[facility]ism Treatment Program in [place]) [address] [place] Office) [place], [place] [address] Phone number: [phone]  [address] (Detox Facilities) [place], [place] [address] Phone number: [phone] Fax number: [phone]  [facility] (Addiction Treatment Center in [place]) [address] [place], [place] [address] Phone number: [phone]  Intensive Outpatient Programs   [facility] [address] [place],  [place], [place] [phone]  [facility] (Inpatient and [phone] (Suboxone  and Methadone ) [address] [phone]  [facility] - Intensive Outpatient [address] [place], [place] [address]  [phone]  [facility] (Outpatient, Inpatient, [facility] (Groups and Residental) (insurance only) [phone] [place], [place] [phone]   [facility] (for caregivers and family) [address] Amy Kansky [address], [place], [place] [phone]  Residential Treatment Programs  [facility](2 years) Residential: 33 days)[facility] ([facility] [address] [place], [place], [place] [phone] or [phone]  [facility] [address] [place], [place], [place] [phone]  [facility] ([facility]) [address] [place], [place], [place] [phone] Admissions: 8am-3pm M-F  [facility]: Residential Program [phone] Days)             [facility]: [place]  [facility], [place], [place]  [phone] or [phone] in Hours over the weekend or by referral)  [facility] [address] [place], [place] [address] [phone] (Do virtual or phone assessment, offer transportation within 25 miles, have in patient and Outpatient options)   [facility]: [facility]:[phone] (for crisis response 24 hours a day)

## 2023-11-04 NOTE — TOC Progression Note (Signed)
 Transition of Care [facility]) - Progression Note    Patient Details  Name: Sarah Sosa MRN: [id number] Date of Birth: Dec 11, 1980  [facility]) CM/SW Contact  Zoe Hinds, RN Phone Number: 11/04/2023, 1:39 PM  Clinical Narrative:    Per chart review TOC consult placed for pt regarding substance abuse. This CM provided resources for substance abuse services on AVS. CM will cont to follow pt's care during hospitalization and update as applicable.         Expected Discharge Plan and Services                                               Social Determinants of Health (SDOH) Interventions SDOH Screenings   Food Insecurity: No Food Insecurity (10/22/2023)  Housing: Low Risk  (10/22/2023)  Recent Concern: Housing - High Risk (09/15/2023)   [facility]  Transportation Needs: No Transportation Needs (10/22/2023)  Utilities: Not At Risk (10/22/2023)  Financial Resource Strain: High Risk (07/19/2023)   [facility]  Social Connections: Moderately Isolated (06/05/2021)   [facility]  Stress: No Stress Concern Present (08/31/2023)   [facility] from [facility]  Tobacco Use: High Risk (11/04/2023)    Readmission Risk Interventions    10/24/2023   11:27 AM 09/20/2023   10:58 AM 08/04/2023    4:19 PM  Readmission Risk Prevention Plan  Transportation Screening Complete Complete Complete  PCP or Specialist Appt within 3-5 Days   Complete  HRI or Home Care Consult   Complete  Social Work Consult for Recovery Care Planning/Counseling   Complete  Palliative Care Screening   Not Applicable  Medication Review Oceanographer) Complete Complete Complete  PCP or Specialist appointment within 3-5 days of discharge Complete Complete   HRI or Home Care Consult  Complete   SW Recovery Care/Counseling Consult Complete Complete   Palliative Care  Screening Not Applicable Not Applicable   Skilled Nursing Facility Not Applicable Not Applicable

## 2023-11-04 NOTE — ED Provider Notes (Signed)
 [facility] Provider Note    Event Date/Time   First MD Initiated Contact with Patient 11/04/23 [phone]     (approximate)   History   Abdominal Pain   HPI Sarah Sosa is a 43 y.o. female with a history of chronic pancreatitis, chronic pain, ongoing alcohol use, and relatively frequent visits to the emergency department and occasional admissions for same.  She states that she is having another flareup of her chronic abdominal pain that she states is due to taking Ozempic.  She said that she is "about to stop taking it" because this happens whenever she takes an injection.  She also admits to ongoing alcohol use but she feels that it is more likely the Ozempic than the alcohol that is causing the recurrence of severe upper abdominal pain.  She has had nausea and vomiting as well as said the pain is the worst its ever been.  She denies any recent fever.  She states that usually we give her 2 mg of Dilaudid  and sometimes that is enough and that sometimes she needs more or has to be admitted.     Physical Exam   Triage Vital Signs: ED Triage Vitals  Encounter Vitals Group     BP 11/04/23 0547 (!) 172/105     Systolic BP Percentile --      Diastolic BP Percentile --      Pulse Rate 11/04/23 0547 (!) 137     Resp 11/04/23 0547 18     Temp 11/04/23 0547 98.7 F (37.1 C)     Temp Source 11/04/23 0547 Oral     SpO2 11/04/23 0547 97 %     Weight 11/04/23 0552 120.6 kg (265 lb 14 oz)     Height 11/04/23 0552 1.676 m (5\' 6" )     Head Circumference --      Peak Flow --      Pain Score 11/04/23 0552 10     Pain Loc --      Pain Education --      Exclude from Growth Chart --     Most recent vital signs: Vitals:   11/04/23 0721 11/04/23 0730  BP: (!) 148/84 (!) 143/79  Pulse:  90  Resp: 16 14  Temp:    SpO2:  93%    General: Awake, appears uncomfortable but not in severe distress. CV:  Good peripheral perfusion.  Mild tachycardia. Resp:  Normal effort.  Speaking easily and comfortably, no accessory muscle usage nor intercostal retractions.   Abd:  Obese.  Abdomen is soft but she is reporting exquisite tenderness and she guards and grabs my hand when I try to palpate on her abdomen.   ED Results / Procedures / Treatments   Labs (all labs ordered are listed, but only abnormal results are displayed) Labs Reviewed  COMPREHENSIVE METABOLIC PANEL WITH GFR - Abnormal; Notable for the following components:      Result Value   Potassium 2.9 (*)    CO2 21 (*)    Glucose, Bld 131 (*)    Total Bilirubin 1.4 (*)    Anion gap 18 (*)    All other components within normal limits  LIPASE, BLOOD - Abnormal; Notable for the following components:   Lipase 58 (*)    All other components within normal limits  MAGNESIUM  - Abnormal; Notable for the following components:   Magnesium  1.5 (*)    All other components within normal limits  CBC WITH DIFFERENTIAL/PLATELET  URINE  DRUG SCREEN, QUALITATIVE ([facility] ONLY)  URINALYSIS, ROUTINE W REFLEX MICROSCOPIC  ETHANOL  POC URINE PREG, ED      PROCEDURES:  Critical Care performed: No  Procedures    IMPRESSION / MDM / ASSESSMENT AND PLAN / ED COURSE  I reviewed the triage vital signs and the nursing notes.                              Differential diagnosis includes, but is not limited to, acute on chronic pain, opioid seeking behavior, cyclic vomiting, acute on chronic pancreatitis.  Patient's presentation is most consistent with acute presentation with potential threat to life or bodily function.  Labs/studies ordered: CMP, CBC with differential, magnesium , lipase, urinalysis, urine drug screen, urine pregnancy test, ethanol level  Interventions/Medications given:  Medications  magnesium  sulfate IVPB 2 g 50 mL (has no administration in time range)  potassium chloride  SA (KLOR-CON  M) CR tablet 40 mEq (has no administration in time range)  potassium chloride  10 mEq in 100 mL IVPB (has no  administration in time range)  droperidol  (INAPSINE ) 2.5 MG/ML injection 2.5 mg (2.5 mg Intravenous Given 11/04/23 0713)  diphenhydrAMINE  (BENADRYL ) injection 12.5 mg (12.5 mg Intravenous Given 11/04/23 0714)  lactated ringers  bolus 1,000 mL (1,000 mLs Intravenous New Bag/Given 11/04/23 0717)    (Note:  hospital course my include additional interventions and/or labs/studies not listed above.)   Vital signs are notable for some mild tachycardia.  Presentation is consistent with a flare of her chronic pain.  I checked the [place]  controlled substance database and see that she takes Suboxone  regularly and just filled a prescription a couple of weeks ago that is supposed to last for a month.  She also had a prescription recently for narcotics when she was discharged from the hospital a couple of weeks ago.  I am concerned about the possibility of drug-seeking behavior but also it is very possible she is having a flare of chronic pancreatitis given that she admits to continuing to drink wine.  It may be that the Ozempic is also causing some of the symptoms although that is less clear to me.  Regardless, I will attempt to treat with nonopioid medications with medications as listed above including repletion of her electrolytes (she has hypokalemia and hypomagnesemia).  She is getting a liter of fluids.  I am transferring ED care to Dr. Demetrios Finders to follow-up and reassess the patient to determine appropriateness for discharge versus inpatient treatment.         FINAL CLINICAL IMPRESSION(S) / ED DIAGNOSES   Final diagnoses:  Other chronic pain  Nausea and vomiting, unspecified vomiting type  Hypokalemia  Hypomagnesemia     Rx / DC Orders   ED Discharge Orders     None        Note:  This document was prepared using Dragon voice recognition software and may include unintentional dictation errors.   Lynnda Sas, MD 11/04/23 [phone]

## 2023-11-04 NOTE — Plan of Care (Signed)

## 2023-11-04 NOTE — ED Triage Notes (Signed)
 Pt reports left upper quadrant abdominal pain.  Pt reports /N /V x4.  Pt has been seen in this ED multiple times for the same and states this issue is related to Ozempic.

## 2023-11-04 NOTE — Progress Notes (Signed)
 1837 Pt made aware of fall risk policy. (Keeping bed in low position and bed alarm on) Pt verbalizes understanding but refuses for bed to be in low position. States having a tall bed at home she is used to getting in and out off. Call bell and positions within reach

## 2023-11-04 NOTE — H&P (Signed)
 History and Physical    Sarah Sosa QMV:[id number] DOB: [birth year] DOA: 11/04/2023  PCP: Clinic, [facility] (Confirm with patient/family/NH records and if not entered, this has to be entered at [facility] point of entry) Patient coming from: Home  I have personally briefly reviewed patient's old medical records in [facility]  Chief Complaint: Pancreatitis has come back  HPI: Sarah Sosa is a 43 y.o. female with medical history significant of mild intermittent asthma, chronic pancreatitis secondary to alcohol abuse, chronic pain syndrome narcotic dependence, aortic stenosis status post AVR on chronic Coumadin  therapy, morbid obesity  IIDM, HTN/chronic HFpEF, presented with sudden onset of abdominal pain nauseous vomiting.  Patient reported that he drank 3 glasses of wine last night and started to have severe epigastric abdominal pain radiating to the back this morning, "I knew the pancreatitis has come back".  In addition she also reported that she was started on Ozempic injection 1 to 2 months ago and she was warned by PCP that Ozempic has the risk of causing recurrent pancreatitis.  She has been feeling nausea and vomited 1 time of stomach content nonbloody nonbilious.  Denies any diarrhea the pain is localized in the epigastric area, radiating to the back cramping-like constant.  She has not been able to eat or drink since this morning because of the pain.  ED Course: Afebrile, blood and tachycardia blood pressure 130/70 O2 saturation 93% on room air.  Blood work showed lipase 58, hemoglobin 14.9 WBC 7.5, BUN 6 creatinine 0.6, K2.9 magnesium  1.5.  Patient was given IV Dilaudid , Zofran , droperidol , but failed oral challenge.  Blood alcohol level 161.    Review of Systems: As per HPI otherwise 14 point review of systems negative.    Past Medical History:  Diagnosis Date   Arthritis    Asthma    Chronic pain disorder 03/19/2018   GERD (gastroesophageal reflux disease)    Headache     Heart murmur    History of trichomoniasis 03/19/2018   Hypertension    Intractable nausea and vomiting    Pancreatitis    PID (pelvic inflammatory disease) 02/14/2018   Uterine leiomyoma 03/19/2018    Past Surgical History:  Procedure Laterality Date   ABDOMINAL HYSTERECTOMY     AORTIC VALVE REPLACEMENT  03/2022   CHOLECYSTECTOMY     DENTAL SURGERY     HYSTERECTOMY ABDOMINAL WITH SALPINGECTOMY Bilateral 04/15/2018   Procedure: HYSTERECTOMY ABDOMINAL WITH BILATERAL SALPINGECTOMY;  Surgeon: Teresa Fender, MD;  Location: [facility] ORS;  Service: Gynecology;  Laterality: Bilateral;   kenn surgery Bilateral    KNEE ARTHROSCOPY Left 2012, 2013     reports that she has been smoking cigarettes. She has a 11 pack-year smoking history. She has never used smokeless tobacco. She reports that she does not currently use alcohol. She reports current drug use. Drug: Marijuana.  Allergies  Allergen Reactions   Latex Anaphylaxis, Hives and Itching    Other reaction(s): Other (See Comments) SKIN BURNING & PEELING    Nsaids Other (See Comments)    Serve ulcers; stomach aches, GERD.  Other Reaction(s): Other (See Comments)   Tomato Anaphylaxis, Hives and Itching   Gabapentin  Palpitations    Has heart condition; also makes jittery   Metformin      Other Reaction(s): Abdominal Pain  Causes pancreatitis to worsen   Lisinopril      Other Reaction(s): Angioedema   Tramadol Itching and Hives   Duloxetine Anxiety    Makes her jittery    Family History  Problem  Relation Age of Onset   Fibroids Mother    Hypertension Mother    Arthritis Mother      Prior to Admission medications   Medication Sig Start Date End Date Taking? Authorizing Provider  albuterol  (VENTOLIN  HFA) 108 (90 Base) MCG/ACT inhaler Inhale 1-2 puffs into the lungs every 6 (six) hours as needed. 07/26/23   [provider]  amitriptyline  (ELAVIL ) 10 MG tablet Take 10 mg by mouth 3 (three) times daily as needed. 11/02/22    [provider]  amLODipine  (NORVASC ) 10 MG tablet Take 1 tablet (10 mg total) by mouth daily. 07/19/23   Aisha Hove, MD  atorvastatin  (LIPITOR) 40 MG tablet Take 40 mg by mouth daily. 10/17/22   [provider]  furosemide  (LASIX ) 40 MG tablet Take 0.5 tablets (20 mg total) by mouth daily as needed. Home med. 10/26/23   Garrison Kanner, MD  lipase/protease/amylase (CREON ) 36000 UNITS CPEP capsule Take 1 capsule (36,000 Units total) by mouth 3 (three) times daily with meals. 08/13/23   Tiajuana Fluke, MD  losartan  (COZAAR ) 50 MG tablet Take 1 tablet (50 mg total) by mouth daily. 07/19/23   Aisha Hove, MD  metFORMIN  (GLUCOPHAGE ) 500 MG tablet Take 500 mg by mouth every morning. 10/17/22   [provider]  methadone  (DOLOPHINE ) 10 MG tablet Take 10 mg by mouth 4 (four) times daily. 03/14/23   [provider]  naloxone  (NARCAN ) nasal spray 4 mg/0.1 mL Place 1 spray into the nose once as needed (to reverse overdose). 02/24/23   [provider]  omeprazole (PRILOSEC) 40 MG capsule Take 40 mg by mouth daily. 02/23/23   [provider]  ondansetron  (ZOFRAN ) 4 MG tablet Take 1 tablet (4 mg total) by mouth daily as needed for nausea or vomiting. 08/13/23 08/12/24  Tiajuana Fluke, MD  oxyCODONE  (OXY IR/ROXICODONE ) 5 MG immediate release tablet Take 1 tablet (5 mg total) by mouth every 6 (six) hours as needed for severe pain (pain score 7-10). 10/26/23   Garrison Kanner, MD  Semaglutide, 1 MG/DOSE, 4 MG/3ML SOPN Inject 0.75 mLs into the skin once a week. 10/19/23   [provider]  spironolactone  (ALDACTONE ) 25 MG tablet Take 25 mg by mouth daily. 02/03/22   [provider]  traZODone  (DESYREL ) 50 MG tablet Take 100 mg by mouth at bedtime. 09/07/21   [provider]  warfarin (COUMADIN ) 7.5 MG tablet Take 3.25-7.5 mg by mouth daily. Take 3.25mg   M/W/F/S/Sun and T/Th 7.5mg  (1 tab)    [provider]    Physical  Exam: Vitals:   11/04/23 0721 11/04/23 0730 11/04/23 1027 11/04/23 1030  BP: (!) 148/84 (!) 143/79  136/73  Pulse:  90  73  Resp: 16 14  14   Temp:   98.6 F (37 C)   TempSrc:   Oral   SpO2:  93%  90%  Weight:      Height:        Constitutional: NAD, calm, comfortable Vitals:   11/04/23 0721 11/04/23 0730 11/04/23 1027 11/04/23 1030  BP: (!) 148/84 (!) 143/79  136/73  Pulse:  90  73  Resp: 16 14  14   Temp:   98.6 F (37 C)   TempSrc:   Oral   SpO2:  93%  90%  Weight:      Height:       Eyes: PERRL, lids and conjunctivae normal ENMT: Mucous membranes are moist. Posterior pharynx clear of any exudate or lesions.Normal dentition.  Neck:  normal, supple, no masses, no thyromegaly Respiratory: clear to auscultation bilaterally, no wheezing, no crackles. Normal respiratory effort. No accessory muscle use.  Cardiovascular: Regular rate and rhythm, no murmurs / rubs / gallops. No extremity edema. 2+ pedal pulses. No carotid bruits.  Abdomen: Tenderness on deep palpation on epigastric area, no rebound no guarding, no masses palpated. No hepatosplenomegaly. Bowel sounds positive.  Musculoskeletal: no clubbing / cyanosis. No joint deformity upper and lower extremities. Good ROM, no contractures. Normal muscle tone.  Skin: no rashes, lesions, ulcers. No induration Neurologic: CN 2-12 grossly intact. Sensation intact, DTR normal. Strength 5/5 in all 4.  Psychiatric: Normal judgment and insight. Alert and oriented x 3. Normal mood.     Labs on Admission: I have personally reviewed following labs and imaging studies  CBC: Recent Labs  Lab 11/04/23 0629  WBC 7.5  NEUTROABS 4.3  HGB 14.9  HCT 44.1  MCV 91.1  PLT 350   Basic Metabolic Panel: Recent Labs  Lab 11/04/23 0629  NA 141  K 2.9*  CL 102  CO2 21*  GLUCOSE 131*  BUN 6  CREATININE 0.64  CALCIUM  9.1  MG 1.5*   GFR: Estimated Creatinine Clearance: 121.2 mL/min (by C-G formula based on SCr of 0.64 mg/dL). Liver  Function Tests: Recent Labs  Lab 11/04/23 0629  AST 23  ALT 12  ALKPHOS 78  BILITOT 1.4*  PROT 8.0  ALBUMIN 4.4   Recent Labs  Lab 11/04/23 0629  LIPASE 58*   No results for input(s): "AMMONIA" in the last 168 hours. Coagulation Profile: No results for input(s): "INR", "PROTIME" in the last 168 hours. Cardiac Enzymes: No results for input(s): "CKTOTAL", "CKMB", "CKMBINDEX", "TROPONINI" in the last 168 hours. BNP (last 3 results) No results for input(s): "PROBNP" in the last 8760 hours. HbA1C: No results for input(s): "HGBA1C" in the last 72 hours. CBG: No results for input(s): "GLUCAP" in the last 168 hours. Lipid Profile: No results for input(s): "CHOL", "HDL", "LDLCALC", "TRIG", "CHOLHDL", "LDLDIRECT" in the last 72 hours. Thyroid Function Tests: No results for input(s): "TSH", "T4TOTAL", "FREET4", "T3FREE", "THYROIDAB" in the last 72 hours. Anemia Panel: No results for input(s): "VITAMINB12", "FOLATE", "FERRITIN", "TIBC", "IRON", "RETICCTPCT" in the last 72 hours. Urine analysis:    Component Value Date/Time   COLORURINE YELLOW (A) 10/22/2023 0500   APPEARANCEUR CLOUDY (A) 10/22/2023 0500   LABSPEC 1.010 10/22/2023 0500   PHURINE 6.0 10/22/2023 0500   GLUCOSEU NEGATIVE 10/22/2023 0500   HGBUR MODERATE (A) 10/22/2023 0500   BILIRUBINUR NEGATIVE 10/22/2023 0500   KETONESUR NEGATIVE 10/22/2023 0500   PROTEINUR NEGATIVE 10/22/2023 0500   NITRITE NEGATIVE 10/22/2023 0500   LEUKOCYTESUR NEGATIVE 10/22/2023 0500    Radiological Exams on Admission: No results found.  EKG: None  Assessment/Plan Principal Problem:   Pancreatitis Active Problems:   Acute alcoholic pancreatitis  (please populate well all problems here in Problem List. (For example, if patient is on BP meds at home and you resume or decide to hold them, it is a problem that needs to be her. Same for CAD, COPD, HLD and so on)  Recurrent alcoholic hepatitis - Symptomatic management - Dilaudid   alternated with oxycodone  for pain control - Patient appears to be euvolemic, trial of clear liquid diet, hold off maintenance IV fluid given his history of CHF -Patient has had history of multiple hospitalization recently for repeated pancreatitis and CT abdominal pelvis.  As there is no red flag signs, abdominal exam benign, will try to address her symptoms  without further imaging study at this point. - Other DDx, Ozempic related pancreatitis should be also considered as patient has had 2 episodes of pancreatitis since starting Ozempic in early March.  Encourage patient to discuss with her PCP for benefit  vs risk to whether stay on Ozempic.  Alcohol intoxication - Currently there is no symptoms signs of acute withdrawal - Start CIWA protocol with sparing benzos  Hypokalemia - Secondary to alcohol abuse, p.o. and IV Kcl - Repeat potassium level tomorrow  Hypomagnesemia - IV replacement, recheck level tomorrow  HTN Chronic HFpEF - Continue amlodipine  losartan  and spironolactone  - Hold off Lasix   Aortic stenosis status post metal AVR - No acute concern, consult pharmacy for redosing Coumadin   IIDM -Continue metformin  -SSI  Asthma/COPD - No symptoms signs of acute exacerbation - Continue as needed albuterol    DVT prophylaxis: SCD Code Status: Full code Family Communication: None at bedside Disposition Plan: Expect less than 2 midnight.  Stay Consults called: None Admission status: Telemetry observation   [place] MD [facility] Pager [phone]  11/04/2023, 12:43 PM

## 2023-11-05 DIAGNOSIS — Y906 Blood alcohol level of 120-199 mg/100 ml: Secondary | ICD-10-CM | POA: Diagnosis present

## 2023-11-05 DIAGNOSIS — K860 Alcohol-induced chronic pancreatitis: Secondary | ICD-10-CM | POA: Diagnosis present

## 2023-11-05 DIAGNOSIS — Z8261 Family history of arthritis: Secondary | ICD-10-CM | POA: Diagnosis not present

## 2023-11-05 DIAGNOSIS — Z7984 Long term (current) use of oral hypoglycemic drugs: Secondary | ICD-10-CM | POA: Diagnosis not present

## 2023-11-05 DIAGNOSIS — J4489 Other specified chronic obstructive pulmonary disease: Secondary | ICD-10-CM | POA: Diagnosis present

## 2023-11-05 DIAGNOSIS — F17210 Nicotine dependence, cigarettes, uncomplicated: Secondary | ICD-10-CM | POA: Diagnosis present

## 2023-11-05 DIAGNOSIS — Z765 Malingerer [conscious simulation]: Secondary | ICD-10-CM | POA: Diagnosis not present

## 2023-11-05 DIAGNOSIS — R000 Tachycardia, unspecified: Secondary | ICD-10-CM | POA: Diagnosis present

## 2023-11-05 DIAGNOSIS — Z7901 Long term (current) use of anticoagulants: Secondary | ICD-10-CM | POA: Diagnosis not present

## 2023-11-05 DIAGNOSIS — Z952 Presence of prosthetic heart valve: Secondary | ICD-10-CM | POA: Diagnosis not present

## 2023-11-05 DIAGNOSIS — I5032 Chronic diastolic (congestive) heart failure: Secondary | ICD-10-CM | POA: Diagnosis present

## 2023-11-05 DIAGNOSIS — E66813 Obesity, class 3: Secondary | ICD-10-CM | POA: Diagnosis present

## 2023-11-05 DIAGNOSIS — Z8249 Family history of ischemic heart disease and other diseases of the circulatory system: Secondary | ICD-10-CM | POA: Diagnosis not present

## 2023-11-05 DIAGNOSIS — K852 Alcohol induced acute pancreatitis without necrosis or infection: Secondary | ICD-10-CM | POA: Diagnosis present

## 2023-11-05 DIAGNOSIS — F10129 Alcohol abuse with intoxication, unspecified: Secondary | ICD-10-CM | POA: Diagnosis present

## 2023-11-05 DIAGNOSIS — F1120 Opioid dependence, uncomplicated: Secondary | ICD-10-CM | POA: Diagnosis present

## 2023-11-05 DIAGNOSIS — R1013 Epigastric pain: Secondary | ICD-10-CM | POA: Diagnosis present

## 2023-11-05 DIAGNOSIS — J4520 Mild intermittent asthma, uncomplicated: Secondary | ICD-10-CM | POA: Diagnosis present

## 2023-11-05 DIAGNOSIS — K853 Drug induced acute pancreatitis without necrosis or infection: Secondary | ICD-10-CM | POA: Diagnosis not present

## 2023-11-05 DIAGNOSIS — E876 Hypokalemia: Secondary | ICD-10-CM | POA: Diagnosis present

## 2023-11-05 DIAGNOSIS — I350 Nonrheumatic aortic (valve) stenosis: Secondary | ICD-10-CM | POA: Diagnosis present

## 2023-11-05 DIAGNOSIS — E119 Type 2 diabetes mellitus without complications: Secondary | ICD-10-CM | POA: Diagnosis present

## 2023-11-05 DIAGNOSIS — I110 Hypertensive heart disease with heart failure: Secondary | ICD-10-CM | POA: Diagnosis present

## 2023-11-05 DIAGNOSIS — G894 Chronic pain syndrome: Secondary | ICD-10-CM | POA: Diagnosis present

## 2023-11-05 DIAGNOSIS — K7010 Alcoholic hepatitis without ascites: Secondary | ICD-10-CM | POA: Diagnosis present

## 2023-11-05 DIAGNOSIS — Z6841 Body Mass Index (BMI) 40.0 and over, adult: Secondary | ICD-10-CM | POA: Diagnosis not present

## 2023-11-05 LAB — CBC
HCT: 37.3 % (ref 36.0–46.0)
Hemoglobin: 12.7 g/dL (ref 12.0–15.0)
MCH: 30.8 pg (ref 26.0–34.0)
MCHC: 34 g/dL (ref 30.0–36.0)
MCV: 90.3 fL (ref 80.0–100.0)
Platelets: 257 10*3/uL (ref 150–400)
RBC: 4.13 MIL/uL (ref 3.87–5.11)
RDW: 13.3 % (ref 11.5–15.5)
WBC: 6.7 10*3/uL (ref 4.0–10.5)
nRBC: 0 % (ref 0.0–0.2)

## 2023-11-05 LAB — BASIC METABOLIC PANEL WITH GFR
Anion gap: 11 (ref 5–15)
BUN: 5 mg/dL — ABNORMAL LOW (ref 6–20)
CO2: 24 mmol/L (ref 22–32)
Calcium: 8.9 mg/dL (ref 8.9–10.3)
Chloride: 102 mmol/L (ref 98–111)
Creatinine, Ser: 0.66 mg/dL (ref 0.44–1.00)
GFR, Estimated: >60 mL/min (ref >60)
Glucose, Bld: 101 mg/dL — ABNORMAL HIGH (ref 70–99)
Potassium: 3.3 mmol/L — ABNORMAL LOW (ref 3.5–5.1)
Sodium: 137 mmol/L (ref 135–145)

## 2023-11-05 LAB — GLUCOSE, CAPILLARY
Glucose-Capillary: 117 mg/dL — ABNORMAL HIGH (ref 70–99)
Glucose-Capillary: 121 mg/dL — ABNORMAL HIGH (ref 70–99)
Glucose-Capillary: 102 mg/dL — ABNORMAL HIGH (ref 70–99)
Glucose-Capillary: 113 mg/dL — ABNORMAL HIGH (ref 70–99)

## 2023-11-05 LAB — MAGNESIUM: Magnesium: 1.7 mg/dL (ref 1.7–2.4)

## 2023-11-05 LAB — LIPASE, BLOOD: Lipase: 298 U/L — ABNORMAL HIGH (ref 11–51)

## 2023-11-05 LAB — PROTIME-INR
INR: 1.2 (ref 0.8–1.2)
Prothrombin Time: 15.6 s — ABNORMAL HIGH (ref 11.4–15.2)

## 2023-11-05 MED ORDER — POTASSIUM CHLORIDE IN NACL 40-0.9 MEQ/L-% IV SOLN
INTRAVENOUS | Status: DC
  Filled 2023-11-05 (×2): qty 1000

## 2023-11-05 MED ORDER — MAGNESIUM SULFATE 2 GM/50ML IV SOLN
2.0000 g | Freq: Once | INTRAVENOUS | Status: AC
  Administered 2023-11-05: 2 g via INTRAVENOUS
  Filled 2023-11-05: qty 50

## 2023-11-05 MED ORDER — HYDROMORPHONE HCL 1 MG/ML IJ SOLN
2.0000 mg | INTRAMUSCULAR | Status: DC | PRN
  Administered 2023-11-05 – 2023-11-06 (×7): 2 mg via INTRAVENOUS
  Filled 2023-11-05 (×7): qty 2

## 2023-11-05 MED ORDER — WARFARIN SODIUM 5 MG PO TABS
5.0000 mg | ORAL_TABLET | Freq: Once | ORAL | Status: AC
  Administered 2023-11-05: 5 mg via ORAL
  Filled 2023-11-05: qty 1

## 2023-11-05 NOTE — Consult Note (Signed)
 PHARMACY - ANTICOAGULATION CONSULT NOTE  Pharmacy Consult for warfarin dosing Indication: aortic valve replacement with On-X  Allergies  Allergen Reactions   Latex Anaphylaxis, Hives and Itching    Other reaction(s): Other (See Comments) SKIN BURNING & PEELING    Nsaids Other (See Comments)    Serve ulcers; stomach aches, GERD.  Other Reaction(s): Other (See Comments)   Tomato Anaphylaxis, Hives and Itching   Gabapentin  Palpitations    Has heart condition; also makes jittery   Metformin      Other Reaction(s): Abdominal Pain  Causes pancreatitis to worsen   Lisinopril      Other Reaction(s): Angioedema   Tramadol Itching and Hives   Duloxetine Anxiety    Makes her jittery    Patient Measurements: Height: 5\' 6"  (167.6 cm) Weight: 120.6 kg (265 lb 14 oz) IBW/kg (Calculated) : 59.3 HEPARIN  DW (KG): 88.1  Vital Signs: Temp: 99.2 F (37.3 C) (04/21 0823) Temp Source: Oral (04/21 0433) BP: 151/106 (04/21 0823) Pulse Rate: 81 (04/21 0823)  Labs: Recent Labs    11/04/23 0629 11/04/23 1312 11/05/23 0612 11/05/23 0751  HGB 14.9  --   --  12.7  HCT 44.1  --   --  37.3  PLT 350  --   --  257  LABPROT  --  15.6* 15.6*  --   INR  --  1.2 1.2  --   CREATININE 0.64  --  0.66  --     Estimated Creatinine Clearance: 121.2 mL/min (by C-G formula based on SCr of 0.66 mg/dL).   Medical History: Past Medical History:  Diagnosis Date   Arthritis    Asthma    Chronic pain disorder 03/19/2018   GERD (gastroesophageal reflux disease)    Headache    Heart murmur    History of trichomoniasis 03/19/2018   Hypertension    Intractable nausea and vomiting    Pancreatitis    PID (pelvic inflammatory disease) 02/14/2018   Uterine leiomyoma 03/19/2018    Medications:  Per last Anticoagulation clinic visit on 10/19/23 warfarin dose reduced to 3.375 mg daily.  Last dose per Med rec 4/19 am  Assessment: 43 yo female presenting to the ED with abdominal pain.  PMH includes  asthma, chronic pancreatitis, chronic pain syndrome, AVR on chronic anticoagulation, T2DM, HTN, and HFpEF.  Patient being admitted for treatment of acute on chronic pancreatitis.  Goal of Therapy:  INR 1.5-2.0 per Anticoag clinic notes Monitor platelets by anticoagulation protocol: Yes    Date INR Warfarin Dose  4/20 1.2 7.5 mg  4/21 1.2              Plan:  INR is subtherapeutic at 1.2   (goal INR 1.5-2.0) Will order warfarin 5 mg po x 1 as anticipate INR to increase from previous 7.5 mg dose Daily INR while on inpatient and CBC at least weekly  Guage Efferson A, PharmD 11/05/2023,8:29 AM

## 2023-11-05 NOTE — Plan of Care (Signed)
  Problem: Education: Goal: Ability to describe self-care measures that may prevent or decrease complications (Diabetes Survival Skills Education) will improve Outcome: Progressing Goal: Individualized Educational Video(s) Outcome: Progressing   Problem: Coping: Goal: Ability to adjust to condition or change in health will improve Outcome: Progressing   Problem: Fluid Volume: Goal: Ability to maintain a balanced intake and output will improve Outcome: Progressing   Problem: Health Behavior/Discharge Planning: Goal: Ability to identify and utilize available resources and services will improve Outcome: Progressing Goal: Ability to manage health-related needs will improve Outcome: Progressing   Problem: Metabolic: Goal: Ability to maintain appropriate glucose levels will improve Outcome: Progressing   Problem: Nutritional: Goal: Maintenance of adequate nutrition will improve Outcome: Not Progressing Goal: Progress toward achieving an optimal weight will improve Outcome: Not Progressing   Problem: Skin Integrity: Goal: Risk for impaired skin integrity will decrease Outcome: Progressing

## 2023-11-05 NOTE — Progress Notes (Signed)
  Progress Note   Patient: Sarah Sosa ZOX:[id number] DOB: Mar 18, 1981 DOA: 11/04/2023     0 DOS: the patient was seen and examined on 11/05/2023    Assessment and Plan:  Acute alcoholic pancreatitis - IV NS w/ 40K+ @ 75 cc/hr - Creon  36,000 units PO tid  - IV dilaudid  2 mg q3 hr PRN  - Oxycodone  PRN    Recurrent alcoholic hepatitis - Management as above    Alcohol intoxication - Folic acid  1 mg PO daily  - MVI PO daily  - Thiamine  100 mg PO daily  - Start CIWA protocol with sparing benzos   Hypokalemia - IV fluids with K+ as above    Hypomagnesemia - Resolved   HTN Chronic HFpEF - Continue amlodipine  losartan  and spironolactone  - Hold off Lasix    Aortic stenosis status post metal AVR - No acute concern, consult pharmacy for redosing Coumadin    IIDM -Continue metformin  -SSI   Asthma/COPD - No symptoms signs of acute exacerbation - Continue as needed albuterol    Subjective: Pt seen and examined at the bedside. Lipase has uptrended 58 -->298. Pt remains tender in the abdomen.  IV fluids w/ K+ ordered as pt's remains hypokalemic. IV mag also given (keep above 2.0). Continue with pain control.   Physical Exam: Vitals:   11/04/23 1843 11/04/23 2023 11/05/23 0433 11/05/23 0823  BP: (!) 130/94 (!) 148/93 (!) 146/62 (!) 151/106  Pulse: 87 80 76 81  Resp: 18 16 16 16   Temp: 98.2 F (36.8 C) 98.2 F (36.8 C) 98.2 F (36.8 C) 99.2 F (37.3 C)  TempSrc: Oral  Oral   SpO2: 100% 100% 100% 98%  Weight:      Height:       Physical Exam HENT:     Head: Normocephalic.  Cardiovascular:     Rate and Rhythm: Normal rate and regular rhythm.  Pulmonary:     Effort: Pulmonary effort is normal.  Abdominal:     Tenderness: There is abdominal tenderness.  Skin:    General: Skin is warm.  Neurological:     Mental Status: She is alert and oriented to person, place, and time.  Psychiatric:        Mood and Affect: Mood normal.       Disposition: Status is:  Inpatient Remains inpatient appropriate because: IV fluids and electrolyte replacement. Pain control   Planned Discharge Destination: Home    Time spent: 35 minutes  Author: Charnice Zwilling , MD 11/05/2023 2:13 PM  For on call review [email or web address].

## 2023-11-06 DIAGNOSIS — K852 Alcohol induced acute pancreatitis without necrosis or infection: Secondary | ICD-10-CM | POA: Diagnosis not present

## 2023-11-06 LAB — PHOSPHORUS: Phosphorus: 2.2 mg/dL — ABNORMAL LOW (ref 2.5–4.6)

## 2023-11-06 LAB — COMPREHENSIVE METABOLIC PANEL WITH GFR
ALT: 18 U/L (ref 0–44)
AST: 53 U/L — ABNORMAL HIGH (ref 15–41)
Albumin: 3.6 g/dL (ref 3.5–5.0)
Alkaline Phosphatase: 90 U/L (ref 38–126)
Anion gap: 7 (ref 5–15)
BUN: <5 mg/dL — ABNORMAL LOW (ref 6–20)
CO2: 25 mmol/L (ref 22–32)
Calcium: 8.8 mg/dL — ABNORMAL LOW (ref 8.9–10.3)
Chloride: 106 mmol/L (ref 98–111)
Creatinine, Ser: 0.77 mg/dL (ref 0.44–1.00)
GFR, Estimated: >60 mL/min (ref >60)
Glucose, Bld: 125 mg/dL — ABNORMAL HIGH (ref 70–99)
Potassium: 3.5 mmol/L (ref 3.5–5.1)
Sodium: 138 mmol/L (ref 135–145)
Total Bilirubin: 1.2 mg/dL (ref 0.0–1.2)
Total Protein: 6.7 g/dL (ref 6.5–8.1)

## 2023-11-06 LAB — CBC
HCT: 36.4 % (ref 36.0–46.0)
Hemoglobin: 12.3 g/dL (ref 12.0–15.0)
MCH: 30.9 pg (ref 26.0–34.0)
MCHC: 33.8 g/dL (ref 30.0–36.0)
MCV: 91.5 fL (ref 80.0–100.0)
Platelets: 223 10*3/uL (ref 150–400)
RBC: 3.98 MIL/uL (ref 3.87–5.11)
RDW: 13.2 % (ref 11.5–15.5)
WBC: 4.6 10*3/uL (ref 4.0–10.5)
nRBC: 0 % (ref 0.0–0.2)

## 2023-11-06 LAB — C-REACTIVE PROTEIN: CRP: 2.5 mg/dL — ABNORMAL HIGH (ref <1.0)

## 2023-11-06 LAB — MAGNESIUM: Magnesium: 2 mg/dL (ref 1.7–2.4)

## 2023-11-06 LAB — GLUCOSE, CAPILLARY
Glucose-Capillary: 131 mg/dL — ABNORMAL HIGH (ref 70–99)
Glucose-Capillary: 158 mg/dL — ABNORMAL HIGH (ref 70–99)
Glucose-Capillary: 129 mg/dL — ABNORMAL HIGH (ref 70–99)
Glucose-Capillary: 84 mg/dL (ref 70–99)

## 2023-11-06 LAB — LIPASE, BLOOD: Lipase: 101 U/L — ABNORMAL HIGH (ref 11–51)

## 2023-11-06 LAB — PROTIME-INR
INR: 1.5 — ABNORMAL HIGH (ref 0.8–1.2)
Prothrombin Time: 18 s — ABNORMAL HIGH (ref 11.4–15.2)

## 2023-11-06 MED ORDER — HYDROMORPHONE HCL 1 MG/ML IJ SOLN
2.0000 mg | INTRAMUSCULAR | Status: DC | PRN
  Administered 2023-11-06 – 2023-11-07 (×7): 2 mg via INTRAVENOUS
  Filled 2023-11-06 (×8): qty 2

## 2023-11-06 MED ORDER — WARFARIN SODIUM 5 MG PO TABS
5.0000 mg | ORAL_TABLET | Freq: Once | ORAL | Status: AC
  Administered 2023-11-06: 5 mg via ORAL
  Filled 2023-11-06: qty 1

## 2023-11-06 MED ORDER — K PHOS MONO-SOD PHOS DI & MONO 155-852-130 MG PO TABS
500.0000 mg | ORAL_TABLET | Freq: Once | ORAL | Status: DC
  Filled 2023-11-06: qty 2

## 2023-11-06 NOTE — Hospital Course (Signed)
 43yo with h/o mild intermittent asthma, chronic alcoholic pancreatitis, chronic pain, AS s/p AVR on Coumadin , DM, HTN, chronic HFpEF, and morbid obesity who presented on 4/20 with abdominal pain and n/v.  She was diagnosed with acute on chronic alcoholic pancreatitis and started on IVF and pain medication.

## 2023-11-06 NOTE — Progress Notes (Signed)
 Progress Note   Patient: Sarah Sosa ZOX:[id number] DOB: 1981/04/07 DOA: 11/04/2023     1 DOS: the patient was seen and examined on 11/06/2023   Brief hospital course: 43yo with h/o mild intermittent asthma, chronic alcoholic pancreatitis, chronic pain, AS s/p AVR on Coumadin , DM, HTN, chronic HFpEF, and morbid obesity who presented on 4/20 with abdominal pain and n/v.  She was diagnosed with acute on chronic alcoholic pancreatitis and started on IVF and pain medication.  Assessment and Plan:  Acute alcoholic pancreatitis Now tolerating clears; will stop IVF Creon  36,000 units PO tid  IV dilaudid  2 mg q3 hr PRN spaced to q4h, will plan to cut dose in half tomorrow  Oxycodone  5 mg q4 hr PRN  Will likely add back Suboxone  on 4/23 and attempt to wean off other opiates She will not be discharged on additional opiates  Chronic pain syndrome I have reviewed this patient in the [place] Controlled Substances Reporting System.  She is receiving medications from multiple providers. She IS at particularly high risk of opioid misuse, diversion, or overdose.   Her UDS was positive for methadone  despite no Rx for this medication since 07/31/23 (30 day supply) She is supposed to be taking suboxone ; will likely add that back for her on 4/23 to try to limit her opiate needs  Recurrent alcoholic hepatitis Outpatient f/u with GI Stop alcohol   Alcohol intoxication ETOH level 161 on presentation Folic acid  1 mg PO daily  MVI PO daily  Thiamine  100 mg PO daily  Start CIWA protocol with sparing benzos   HTN Chronic HFpEF Continue amlodipine , losartan , and spironolactone  Hold Lasix  for now   Aortic stenosis status post metal AVR No acute concern, consult pharmacy for redosing Coumadin    IIDM Recent A1c was 6.4, good control Hold Glucophage  Cover with moderate-scale SSI Carb modified diet    Asthma/COPD No symptoms signs of acute exacerbation Continue as needed  albuterol      Consultants: None  Procedures: None  Antibiotics: None  30 Day Unplanned Readmission Risk Score    Flowsheet Row ED to Hosp-Admission (Current) from 11/04/2023 in [facility] (1A)  30 Day Unplanned Readmission Risk Score (%) 54.46 Filed at 11/06/2023 0801       This score is the patient's risk of an unplanned readmission within 30 days of being discharged (0 -100%). The score is based on dignosis, age, lab data, medications, orders, and past utilization.   Low:  0-14.9   Medium: 15-21.9   High: 22-29.9   Extreme: 30 and above           Subjective: Reports symptoms are better, tolerating CLD.  Concerned that last time she was advanced too quickly and doesn't want to do that again.   Objective: Vitals:   11/06/23 0424 11/06/23 0751  BP: 136/85 112/87  Pulse: 80 83  Resp: 18 16  Temp: 97.9 F (36.6 C) 98.4 F (36.9 C)  SpO2: 96% 97%    Intake/Output Summary (Last 24 hours) at 11/06/2023 1458 Last data filed at 11/06/2023 1046 Gross per 24 hour  Intake 1961.33 ml  Output --  Net 1961.33 ml   Filed Weights   11/04/23 0552  Weight: 120.6 kg    Exam:  General:  Appears calm and comfortable and is in NAD Eyes:  EOMI, normal lids, iris ENT:  grossly normal hearing, lips & tongue, mmm Neck:  no LAD, masses or thyromegaly Cardiovascular:  RRR, valvular click appreciated. No LE edema.  Respiratory:  CTA bilaterally with no wheezes/rales/rhonchi.  Normal respiratory effort. Abdomen:  soft, NT, ND Skin:  no rash or induration seen on limited exam Musculoskeletal:  grossly normal tone BUE/BLE, good ROM, no bony abnormality Psychiatric:  grossly normal mood and affect, speech fluent and appropriate, AOx3 Neurologic:  CN 2-12 grossly intact, moves all extremities in coordinated fashion     Data Reviewed: I have reviewed the patient's lab results since admission.  Pertinent labs for today include:   Glucose  125 Phos 2.2, repleted, AST 53 Normal CBC INR 1.5 UDS + methadone , THC, opiates, tricyclic    Family Communication: None present  Disposition: Status is: Inpatient Remains inpatient appropriate because: ongoing management     Time spent: 50 minutes  Unresulted Labs (From admission, onward)     Start     Ordered   11/07/23 0500  CBC with Differential/Platelet  Tomorrow morning,   R       Question:  Specimen collection method  Answer:  Lab=Lab collect   11/06/23 1458   11/07/23 0500  Basic metabolic panel with GFR  Tomorrow morning,   R       Question:  Specimen collection method  Answer:  Lab=Lab collect   11/06/23 1458   11/05/23 0500  Protime-INR  Daily,   R      11/04/23 1306             Author: Lorita Rosa, MD 11/06/2023 2:58 PM  For on call review [email or web address].

## 2023-11-06 NOTE — Plan of Care (Signed)
  Problem: Education: Goal: Ability to describe self-care measures that may prevent or decrease complications (Diabetes Survival Skills Education) will improve Outcome: Progressing   Problem: Coping: Goal: Ability to adjust to condition or change in health will improve Outcome: Progressing   Problem: Health Behavior/Discharge Planning: Goal: Ability to manage health-related needs will improve Outcome: Progressing   Problem: Skin Integrity: Goal: Risk for impaired skin integrity will decrease Outcome: Progressing   Problem: Tissue Perfusion: Goal: Adequacy of tissue perfusion will improve Outcome: Progressing

## 2023-11-06 NOTE — Consult Note (Signed)
 PHARMACY - ANTICOAGULATION CONSULT NOTE  Pharmacy Consult for warfarin dosing Indication: aortic valve replacement with On-X  Allergies  Allergen Reactions   Latex Anaphylaxis, Hives and Itching    Other reaction(s): Other (See Comments) SKIN BURNING & PEELING    Nsaids Other (See Comments)    Serve ulcers; stomach aches, GERD.  Other Reaction(s): Other (See Comments)   Tomato Anaphylaxis, Hives and Itching   Gabapentin  Palpitations    Has heart condition; also makes jittery   Metformin      Other Reaction(s): Abdominal Pain  Causes pancreatitis to worsen   Lisinopril      Other Reaction(s): Angioedema   Tramadol Itching and Hives   Duloxetine Anxiety    Makes her jittery    Patient Measurements: Height: 5\' 6"  (167.6 cm) Weight: 120.6 kg (265 lb 14 oz) IBW/kg (Calculated) : 59.3 HEPARIN  DW (KG): 88.1  Vital Signs: Temp: 98.4 F (36.9 C) (04/22 0751) Temp Source: Oral (04/22 0424) BP: 112/87 (04/22 0751) Pulse Rate: 83 (04/22 0751)  Labs: Recent Labs    11/04/23 0629 11/04/23 1312 11/05/23 0612 11/05/23 0751 11/06/23 0611  HGB 14.9  --   --  12.7 12.3  HCT 44.1  --   --  37.3 36.4  PLT 350  --   --  257 223  LABPROT  --  15.6* 15.6*  --  18.0*  INR  --  1.2 1.2  --  1.5*  CREATININE 0.64  --  0.66  --  0.77    Estimated Creatinine Clearance: 121.2 mL/min (by C-G formula based on SCr of 0.77 mg/dL).   Medical History: Past Medical History:  Diagnosis Date   Arthritis    Asthma    Chronic pain disorder 03/19/2018   GERD (gastroesophageal reflux disease)    Headache    Heart murmur    History of trichomoniasis 03/19/2018   Hypertension    Intractable nausea and vomiting    Pancreatitis    PID (pelvic inflammatory disease) 02/14/2018   Uterine leiomyoma 03/19/2018    Medications:  Per last Anticoagulation clinic visit on 10/19/23 warfarin dose reduced to 3.375 mg daily.  Last dose per Med rec 4/19 am  Assessment: 43 yo female presenting to the  ED with abdominal pain.  PMH includes asthma, chronic pancreatitis, chronic pain syndrome, AVR on chronic anticoagulation, T2DM, HTN, and HFpEF.  Patient being admitted for treatment of acute on chronic pancreatitis.  Goal of Therapy:  INR 1.5-2.0 per Anticoag clinic notes Monitor platelets by anticoagulation protocol: Yes    Date INR Warfarin Dose  4/20 1.2 7.5 mg  4/21 1.2 5 mg  4/22 1.5          Plan:  INR is therapeutic at 1.5  (goal INR 1.5-2.0) Will order warfarin 5 mg po x 1 again Daily INR while on inpatient and CBC at least weekly  Thomasine Flick PharmD Clinical Pharmacist 11/06/2023

## 2023-11-07 DIAGNOSIS — K852 Alcohol induced acute pancreatitis without necrosis or infection: Secondary | ICD-10-CM | POA: Diagnosis not present

## 2023-11-07 LAB — CBC WITH DIFFERENTIAL/PLATELET
Abs Immature Granulocytes: 0.01 10*3/uL (ref 0.00–0.07)
Basophils Absolute: 0 10*3/uL (ref 0.0–0.1)
Basophils Relative: 1 %
Eosinophils Absolute: 0.3 10*3/uL (ref 0.0–0.5)
Eosinophils Relative: 11 %
HCT: 33.1 % — ABNORMAL LOW (ref 36.0–46.0)
Hemoglobin: 11.3 g/dL — ABNORMAL LOW (ref 12.0–15.0)
Immature Granulocytes: 0 %
Lymphocytes Relative: 40 %
Lymphs Abs: 1.3 10*3/uL (ref 0.7–4.0)
MCH: 30.9 pg (ref 26.0–34.0)
MCHC: 34.1 g/dL (ref 30.0–36.0)
MCV: 90.4 fL (ref 80.0–100.0)
Monocytes Absolute: 0.2 10*3/uL (ref 0.1–1.0)
Monocytes Relative: 7 %
Neutro Abs: 1.3 10*3/uL — ABNORMAL LOW (ref 1.7–7.7)
Neutrophils Relative %: 41 %
Platelets: 203 10*3/uL (ref 150–400)
RBC: 3.66 MIL/uL — ABNORMAL LOW (ref 3.87–5.11)
RDW: 13.3 % (ref 11.5–15.5)
WBC: 3.2 10*3/uL — ABNORMAL LOW (ref 4.0–10.5)
nRBC: 0 % (ref 0.0–0.2)

## 2023-11-07 LAB — GLUCOSE, CAPILLARY
Glucose-Capillary: 106 mg/dL — ABNORMAL HIGH (ref 70–99)
Glucose-Capillary: 160 mg/dL — ABNORMAL HIGH (ref 70–99)
Glucose-Capillary: 121 mg/dL — ABNORMAL HIGH (ref 70–99)
Glucose-Capillary: 104 mg/dL — ABNORMAL HIGH (ref 70–99)

## 2023-11-07 LAB — PROTIME-INR
INR: 1.7 — ABNORMAL HIGH (ref 0.8–1.2)
Prothrombin Time: 20.2 s — ABNORMAL HIGH (ref 11.4–15.2)

## 2023-11-07 LAB — BASIC METABOLIC PANEL WITH GFR
Anion gap: 6 (ref 5–15)
BUN: <5 mg/dL — ABNORMAL LOW (ref 6–20)
CO2: 24 mmol/L (ref 22–32)
Calcium: 8.3 mg/dL — ABNORMAL LOW (ref 8.9–10.3)
Chloride: 107 mmol/L (ref 98–111)
Creatinine, Ser: 0.7 mg/dL (ref 0.44–1.00)
GFR, Estimated: >60 mL/min (ref >60)
Glucose, Bld: 127 mg/dL — ABNORMAL HIGH (ref 70–99)
Potassium: 3.3 mmol/L — ABNORMAL LOW (ref 3.5–5.1)
Sodium: 137 mmol/L (ref 135–145)

## 2023-11-07 MED ORDER — HYDROMORPHONE HCL 1 MG/ML IJ SOLN
1.0000 mg | INTRAMUSCULAR | Status: DC | PRN
  Administered 2023-11-07 – 2023-11-08 (×5): 1 mg via INTRAVENOUS
  Filled 2023-11-07 (×6): qty 1

## 2023-11-07 MED ORDER — WARFARIN SODIUM 5 MG PO TABS
5.0000 mg | ORAL_TABLET | Freq: Once | ORAL | Status: AC
  Administered 2023-11-07: 5 mg via ORAL
  Filled 2023-11-07: qty 1

## 2023-11-07 MED ORDER — BUPRENORPHINE HCL-NALOXONE HCL 8-2 MG SL SUBL
3.0000 | SUBLINGUAL_TABLET | Freq: Every day | SUBLINGUAL | Status: DC
  Administered 2023-11-08 – 2023-11-09 (×2): 3 via SUBLINGUAL
  Filled 2023-11-07 (×2): qty 3

## 2023-11-07 NOTE — Consult Note (Signed)
 PHARMACY - ANTICOAGULATION CONSULT NOTE  Pharmacy Consult for warfarin dosing Indication: aortic valve replacement with On-X  Allergies  Allergen Reactions   Latex Anaphylaxis, Hives and Itching    Other reaction(s): Other (See Comments) SKIN BURNING & PEELING    Nsaids Other (See Comments)    Serve ulcers; stomach aches, GERD.  Other Reaction(s): Other (See Comments)   Tomato Anaphylaxis, Hives and Itching   Gabapentin  Palpitations    Has heart condition; also makes jittery   Metformin      Other Reaction(s): Abdominal Pain  Causes pancreatitis to worsen   Lisinopril      Other Reaction(s): Angioedema   Tramadol Itching and Hives   Duloxetine Anxiety    Makes her jittery    Patient Measurements: Height: 5\' 6"  (167.6 cm) Weight: 120.6 kg (265 lb 14 oz) IBW/kg (Calculated) : 59.3 HEPARIN  DW (KG): 88.1  Vital Signs: Temp: 97.9 F (36.6 C) (04/23 0434) Temp Source: Oral (04/22 1940) BP: 114/63 (04/23 0434) Pulse Rate: 68 (04/23 0434)  Labs: Recent Labs    11/05/23 0612 11/05/23 0751 11/05/23 0751 11/06/23 0611 11/07/23 0332  HGB  --  12.7   < > 12.3 11.3*  HCT  --  37.3  --  36.4 33.1*  PLT  --  257  --  223 203  LABPROT 15.6*  --   --  18.0* 20.2*  INR 1.2  --   --  1.5* 1.7*  CREATININE 0.66  --   --  0.77 0.70   < > = values in this interval not displayed.    Estimated Creatinine Clearance: 121.2 mL/min (by C-G formula based on SCr of 0.7 mg/dL).   Medical History: Past Medical History:  Diagnosis Date   Arthritis    Asthma    Chronic pain disorder 03/19/2018   GERD (gastroesophageal reflux disease)    Headache    Heart murmur    History of trichomoniasis 03/19/2018   Hypertension    Intractable nausea and vomiting    Pancreatitis    PID (pelvic inflammatory disease) 02/14/2018   Uterine leiomyoma 03/19/2018    Medications:  Per last Anticoagulation clinic visit on 10/19/23 warfarin dose reduced to 3.375 mg daily.  Last dose per Med rec 4/19  am  Assessment: 43 yo female presenting to the ED with abdominal pain.  PMH includes asthma, chronic pancreatitis, chronic pain syndrome, AVR on chronic anticoagulation, T2DM, HTN, and HFpEF.  Patient being admitted for treatment of acute on chronic pancreatitis.  Goal of Therapy:  INR 1.5-2.0 per Anticoag clinic notes Monitor platelets by anticoagulation protocol: Yes    Date INR Warfarin Dose  4/20 1.2 7.5 mg  4/21 1.2 5 mg  4/22 1.5 5 mg  4/23 1.7      Plan:  INR remains therapeutic at 1.7 (goal INR 1.5-2.0) CBC stable, no signs/symptoms of bleeding reported, no new drug interactions noted Give warfarin 5 mg x1 this afternoon Daily INR while on inpatient and CBC at least weekly  Thank you for involving pharmacy in this patient's care.   Ananias Balls, PharmD Clinical Pharmacist 11/07/2023 7:21 AM

## 2023-11-07 NOTE — Progress Notes (Signed)
 Progress Note   Patient: Sarah Sosa EXB:[id number] DOB: Dec 16, 1980 DOA: 11/04/2023     2 DOS: the patient was seen and examined on 11/07/2023   Brief hospital course: 43yo with h/o mild intermittent asthma, chronic alcoholic pancreatitis, chronic pain, AS s/p AVR on Coumadin , DM, HTN, chronic HFpEF, and morbid obesity who presented on 4/20 with abdominal pain and n/v.  She was diagnosed with acute on chronic alcoholic pancreatitis and started on IVF and pain medication.  Assessment and Plan:  Acute alcoholic pancreatitis Now tolerating clears; will stop IVF Creon  36,000 units PO tid  IV dilaudid  2 mg q3 hr PRN spaced to q4h, cut to 1 mg q4h prn today Oxycodone  5 mg q4 hr PRN  Will add back Suboxone  on 4/24 and attempt to wean off other opiates She will not be discharged on additional opiates   Chronic pain syndrome I have reviewed this patient in the [place] Controlled Substances Reporting System.  She is receiving medications from multiple providers. She IS at particularly high risk of opioid misuse, diversion, or overdose.   Her UDS was positive for methadone  despite no Rx for this medication since 07/31/23 (30 day supply) She is supposed to be taking suboxone ; will add that back for her on 4/24 to try to limit her opiate needs   Recurrent alcoholic hepatitis Outpatient f/u with GI Stop alcohol - counseled about this today   Alcohol intoxication ETOH level 161 on presentation Folic acid  1 mg PO daily  MVI PO daily  Thiamine  100 mg PO daily  Start CIWA protocol with sparing benzos   HTN Chronic HFpEF Continue amlodipine , losartan , and spironolactone  Hold Lasix  for now   Aortic stenosis status post metal AVR No acute concern, consult pharmacy for redosing Coumadin    IIDM Recent A1c was 6.4, good control Hold Glucophage  Cover with moderate-scale SSI Carb modified diet    Asthma/COPD No symptoms signs of acute exacerbation Continue as needed albuterol           Consultants: None   Procedures: None   Antibiotics: None  30 Day Unplanned Readmission Risk Score    Flowsheet Row ED to Hosp-Admission (Current) from 11/04/2023 in [facility] (1A)  30 Day Unplanned Readmission Risk Score (%) 57.88 Filed at 11/07/2023 0801       This score is the patient's risk of an unplanned readmission within 30 days of being discharged (0 -100%). The score is based on dignosis, age, lab data, medications, orders, and past utilization.   Low:  0-14.9   Medium: 15-21.9   High: 22-29.9   Extreme: 30 and above           Subjective: Reports nausea yesterday, none today.  No vomiting.  Reports pain controlled with current meds and reluctant to change - we discussed the importance of limiting opiates.  She is willing to try to advance her diet, requests soft diet.   Objective: Vitals:   11/07/23 0726 11/07/23 1505  BP: 124/60 (!) 107/57  Pulse: 76 65  Resp: 17 17  Temp: 97.8 F (36.6 C) 98.2 F (36.8 C)  SpO2: 97% 95%    Intake/Output Summary (Last 24 hours) at 11/07/2023 1742 Last data filed at 11/07/2023 1300 Gross per 24 hour  Intake 720 ml  Output --  Net 720 ml   Filed Weights   11/04/23 0552  Weight: 120.6 kg    Exam:  General:  Appears calm and comfortable and is in NAD Eyes:  EOMI, normal lids, iris  ENT:  grossly normal hearing, lips & tongue, mmm Neck:  no LAD, masses or thyromegaly Cardiovascular:  RRR, valvular click appreciated. No LE edema.  Respiratory:   CTA bilaterally with no wheezes/rales/rhonchi.  Normal respiratory effort. Abdomen:  soft, NT, ND Skin:  no rash or induration seen on limited exam Musculoskeletal:  grossly normal tone BUE/BLE, good ROM, no bony abnormality Psychiatric:  grossly normal mood and affect, speech fluent and appropriate, AOx3 Neurologic:  CN 2-12 grossly intact, moves all extremities in coordinated fashion  Data Reviewed: I have reviewed the patient's lab  results since admission.  Pertinent labs for today include:   K+ 3.3 Glucose 127 WBC 3.2 Hgb 11.3 INR 1.7     Family Communication: None present  Disposition: Status is: Inpatient Remains inpatient appropriate because: ongoing monitoring     Time spent: 35 minutes  Unresulted Labs (From admission, onward)     Start     Ordered   11/08/23 0500  CBC with Differential/Platelet  Tomorrow morning,   R       Question:  Specimen collection method  Answer:  Lab=Lab collect   11/07/23 1742   11/08/23 0500  Comprehensive metabolic panel with GFR  Tomorrow morning,   R       Question:  Specimen collection method  Answer:  Lab=Lab collect   11/07/23 1742   11/05/23 0500  Protime-INR  Daily,   R      11/04/23 1306             Author: Lorita Rosa, MD 11/07/2023 5:42 PM  For on call review [email or web address].

## 2023-11-08 DIAGNOSIS — K852 Alcohol induced acute pancreatitis without necrosis or infection: Secondary | ICD-10-CM | POA: Diagnosis not present

## 2023-11-08 LAB — CBC WITH DIFFERENTIAL/PLATELET
Abs Immature Granulocytes: 0.01 10*3/uL (ref 0.00–0.07)
Basophils Absolute: 0 10*3/uL (ref 0.0–0.1)
Basophils Relative: 1 %
Eosinophils Absolute: 0.4 10*3/uL (ref 0.0–0.5)
Eosinophils Relative: 11 %
HCT: 34.9 % — ABNORMAL LOW (ref 36.0–46.0)
Hemoglobin: 11.7 g/dL — ABNORMAL LOW (ref 12.0–15.0)
Immature Granulocytes: 0 %
Lymphocytes Relative: 38 %
Lymphs Abs: 1.5 10*3/uL (ref 0.7–4.0)
MCH: 31 pg (ref 26.0–34.0)
MCHC: 33.5 g/dL (ref 30.0–36.0)
MCV: 92.3 fL (ref 80.0–100.0)
Monocytes Absolute: 0.2 10*3/uL (ref 0.1–1.0)
Monocytes Relative: 5 %
Neutro Abs: 1.8 10*3/uL (ref 1.7–7.7)
Neutrophils Relative %: 45 %
Platelets: 200 10*3/uL (ref 150–400)
RBC: 3.78 MIL/uL — ABNORMAL LOW (ref 3.87–5.11)
RDW: 13.3 % (ref 11.5–15.5)
WBC: 4 10*3/uL (ref 4.0–10.5)
nRBC: 0 % (ref 0.0–0.2)

## 2023-11-08 LAB — COMPREHENSIVE METABOLIC PANEL WITH GFR
ALT: 9 U/L (ref 0–44)
AST: 15 U/L (ref 15–41)
Albumin: 3.1 g/dL — ABNORMAL LOW (ref 3.5–5.0)
Alkaline Phosphatase: 62 U/L (ref 38–126)
Anion gap: 7 (ref 5–15)
BUN: <5 mg/dL — ABNORMAL LOW (ref 6–20)
CO2: 25 mmol/L (ref 22–32)
Calcium: 8.5 mg/dL — ABNORMAL LOW (ref 8.9–10.3)
Chloride: 106 mmol/L (ref 98–111)
Creatinine, Ser: 0.85 mg/dL (ref 0.44–1.00)
GFR, Estimated: >60 mL/min (ref >60)
Glucose, Bld: 110 mg/dL — ABNORMAL HIGH (ref 70–99)
Potassium: 3.3 mmol/L — ABNORMAL LOW (ref 3.5–5.1)
Sodium: 138 mmol/L (ref 135–145)
Total Bilirubin: 0.5 mg/dL (ref 0.0–1.2)
Total Protein: 6.2 g/dL — ABNORMAL LOW (ref 6.5–8.1)

## 2023-11-08 LAB — GLUCOSE, CAPILLARY
Glucose-Capillary: 125 mg/dL — ABNORMAL HIGH (ref 70–99)
Glucose-Capillary: 109 mg/dL — ABNORMAL HIGH (ref 70–99)
Glucose-Capillary: 111 mg/dL — ABNORMAL HIGH (ref 70–99)
Glucose-Capillary: 144 mg/dL — ABNORMAL HIGH (ref 70–99)

## 2023-11-08 LAB — PROTIME-INR
INR: 1.7 — ABNORMAL HIGH (ref 0.8–1.2)
Prothrombin Time: 20.1 s — ABNORMAL HIGH (ref 11.4–15.2)

## 2023-11-08 MED ORDER — POTASSIUM CHLORIDE 20 MEQ PO PACK
40.0000 meq | PACK | Freq: Once | ORAL | Status: AC
  Administered 2023-11-08: 40 meq via ORAL
  Filled 2023-11-08: qty 2

## 2023-11-08 MED ORDER — HYDROMORPHONE HCL 1 MG/ML IJ SOLN
0.5000 mg | INTRAMUSCULAR | Status: DC | PRN
  Administered 2023-11-08 – 2023-11-09 (×5): 0.5 mg via INTRAVENOUS
  Filled 2023-11-08 (×4): qty 0.5

## 2023-11-08 MED ORDER — WARFARIN SODIUM 5 MG PO TABS
5.0000 mg | ORAL_TABLET | Freq: Once | ORAL | Status: AC
  Administered 2023-11-08: 5 mg via ORAL
  Filled 2023-11-08: qty 1

## 2023-11-08 MED ORDER — AMITRIPTYLINE HCL 10 MG PO TABS
10.0000 mg | ORAL_TABLET | Freq: Every evening | ORAL | Status: DC | PRN
Start: 2023-11-08 — End: 2023-11-09

## 2023-11-08 MED ORDER — POTASSIUM CHLORIDE CRYS ER 20 MEQ PO TBCR
40.0000 meq | EXTENDED_RELEASE_TABLET | Freq: Once | ORAL | Status: DC
  Filled 2023-11-08: qty 2

## 2023-11-08 NOTE — Consult Note (Signed)
 PHARMACY - ANTICOAGULATION CONSULT NOTE  Pharmacy Consult for warfarin dosing Indication: aortic valve replacement with On-X  Allergies  Allergen Reactions   Latex Anaphylaxis, Hives and Itching    Other reaction(s): Other (See Comments) SKIN BURNING & PEELING    Nsaids Other (See Comments)    Serve ulcers; stomach aches, GERD.  Other Reaction(s): Other (See Comments)   Tomato Anaphylaxis, Hives and Itching   Gabapentin  Palpitations    Has heart condition; also makes jittery   Metformin      Other Reaction(s): Abdominal Pain  Causes pancreatitis to worsen   Lisinopril      Other Reaction(s): Angioedema   Tramadol Itching and Hives   Duloxetine Anxiety    Makes her jittery    Patient Measurements: Height: 5\' 6"  (167.6 cm) Weight: 120.6 kg (265 lb 14 oz) IBW/kg (Calculated) : 59.3 HEPARIN  DW (KG): 88.1  Vital Signs: Temp: 98.3 F (36.8 C) (04/24 0734) BP: 119/63 (04/24 0734) Pulse Rate: 70 (04/24 0734)  Labs: Recent Labs    11/06/23 0611 11/07/23 0332 11/08/23 0731  HGB 12.3 11.3* 11.7*  HCT 36.4 33.1* 34.9*  PLT 223 203 200  LABPROT 18.0* 20.2* 20.1*  INR 1.5* 1.7* 1.7*  CREATININE 0.77 0.70 0.85    Estimated Creatinine Clearance: 114.1 mL/min (by C-G formula based on SCr of 0.85 mg/dL).   Medical History: Past Medical History:  Diagnosis Date   Arthritis    Asthma    Chronic pain disorder 03/19/2018   GERD (gastroesophageal reflux disease)    Headache    Heart murmur    History of trichomoniasis 03/19/2018   Hypertension    Intractable nausea and vomiting    Pancreatitis    PID (pelvic inflammatory disease) 02/14/2018   Uterine leiomyoma 03/19/2018    Medications:  Per last Anticoagulation clinic visit on 10/19/23 warfarin dose reduced to 3.375 mg daily.  Last dose per Med rec 4/19 am  Assessment: 43 yo female presenting to the ED with abdominal pain.  PMH includes asthma, chronic pancreatitis, chronic pain syndrome, AVR on chronic  anticoagulation, T2DM, HTN, and HFpEF.  Patient being admitted for treatment of acute on chronic pancreatitis.  Goal of Therapy:  INR 1.5-2.0 per Anticoag clinic notes Monitor platelets by anticoagulation protocol: Yes    Date INR Warfarin Dose  4/20 1.2 7.5 mg  4/21 1.2 5 mg  4/22 1.5 5 mg  4/23 1.7 5 mg  4/24 1.7      Plan:  INR remains therapeutic at 1.7 (goal INR 1.5-2.0) CBC stable, no signs/symptoms of bleeding reported, no new drug interactions noted Give warfarin 5 mg x1 this afternoon Daily INR while on inpatient and CBC at least weekly  Thank you for involving pharmacy in this patient's care.   Ananias Balls, PharmD Clinical Pharmacist 11/08/2023 8:16 AM

## 2023-11-08 NOTE — TOC Initial Note (Signed)
 Transition of Care [facility]) - Initial/Assessment Note    Patient Details  Name: Sarah Sosa MRN: [id number] Date of Birth: November 05, 1980  [facility] CM/SW Contact:    Crayton Docker, RN Phone Number: 11/08/2023, 2:28 PM  Clinical Narrative:                  CM to patient's room regarding TOC screening assessment. CM introduced case management role and discharge care planning process. Patient verbalized understanding and agreement with case management interview. Per patient lives with twin 58 year old daughters and patient's father. Per patient, no home health/SNF experience. Per patient independent in ADLs. Per patient, has CPAP but "I don't use it every night, no." Per patient, has private transportation arranged. CM consult for substance use resources and education. CM provided list of substance use resources. Patient verbalized agreement to review.  Patient states has an appointment with primary care provider at [facility], next month. CM suggested patient to keep appointment.  Expected Discharge Plan: Home/Self Care Barriers to Discharge: Continued Medical Work up   Patient Goals and CMS Choice    Home/self care   Expected Discharge Plan and Services   Discharge Planning Services: CM Consult      Prior Living Arrangements/Services   Lives with:: Adult Children, Parents Patient language and need for interpreter reviewed:: No Do you feel safe going back to the place where you live?: Yes      Need for Family Participation in Patient Care: Yes (Comment) Care giver support system in place?: Yes (comment)   Criminal Activity/Legal Involvement Pertinent to Current Situation/Hospitalization: No - Comment as needed  Activities of Daily Living   ADL Screening (condition at time of admission) Independently performs ADLs?: Yes (appropriate for developmental age) Is the patient deaf or have difficulty hearing?: No Does the patient have difficulty seeing, even when  wearing glasses/contacts?: No Does the patient have difficulty concentrating, remembering, or making decisions?: No  Permission Sought/Granted Permission sought to share information with : Case Manager Permission granted to share information with : Yes, Verbal Permission Granted  Share Information with NAME: Calvin Spiller/Shantel Carrero/Ahmad Carriel     Permission granted to share info w Relationship: father/daughter/significant other  Permission granted to share info w Contact Information: [phone]  Emotional Assessment Appearance:: Appears stated age Attitude/Demeanor/Rapport: Engaged Affect (typically observed): Calm Orientation: : Oriented to Self, Oriented to Place, Oriented to  Time, Oriented to Situation Alcohol / Substance Use: Alcohol Use, Illicit Drugs, Tobacco Use (Last cigarette use on 11/04/2023; Last alcohol use on 11/01/2023; Last marijuana use on last month.) Psych Involvement: No (comment)  Admission diagnosis:  Hypokalemia [E87.6] Hypomagnesemia [E83.42] Pancreatitis [K85.90] Other chronic pain [G89.29] Nausea and vomiting, unspecified vomiting type [R11.2] Patient Active Problem List   Diagnosis Date Noted   Acute on chronic pancreatitis (HCC) 10/21/2023   Dyslipidemia 10/21/2023   Chronic pain syndrome 10/21/2023   Alcohol intoxication (HCC) 10/21/2023   Type 2 diabetes mellitus without complications (HCC) 10/21/2023   Status post aortic valve replacement with metallic valve 10/21/2023   Pancreatitis 09/15/2023   Secondary hypercoagulability disorder (HCC) 09/15/2023   Adnexal cyst 09/15/2023   Chest pain 09/15/2023   Chronic diastolic CHF (congestive heart failure) (HCC) 09/15/2023   DM2 (diabetes mellitus, type 2) (HCC) 09/15/2023   Acute pain 09/15/2023   Elevated troponin 07/13/2023   Subtherapeutic international normalized ratio (INR) 07/13/2023   Chronic pancreatitis (HCC) 07/13/2023   S/p aortic valve replacement with  'On-X" valve (INR target of  1.5 to 2) and ascending aortic graft ([facility], 03/2022) 07/13/2023   CAP (community acquired pneumonia) 07/13/2023   Opioid overdose, accidental or unintentional, initial encounter (HCC) 03/20/2023   Vomiting 03/20/2023   Obesity, Class III, BMI 40-49.9 (morbid obesity) (HCC) 03/20/2023   History of pancreatitis 03/20/2023   Acute respiratory failure with hypoxia (HCC) 03/20/2023   Chronic use of opiate drug for therapeutic purpose 06/02/2022   S/P ascending aortic replacement 04/12/2022   Hypomagnesemia 11/23/2021   Essential hypertension 11/20/2021   GERD without esophagitis 11/20/2021   (HFpEF) heart failure with preserved ejection fraction (HCC) 11/20/2021   Peripheral neuropathy 11/20/2021   Asthma, chronic 11/20/2021   Acute alcoholic pancreatitis 11/20/2021   Alcohol abuse 11/20/2021   Acute pancreatitis 11/20/2021   History of heavy alcohol consumption 05/22/2021   Recurrent pancreatitis 06/04/2020   Intractable nausea and vomiting    Marijuana use    Hypertensive urgency 04/03/2020   Hypokalemia 04/03/2020   Tobacco abuse 04/03/2020   History of substance abuse (HCC) 04/03/2020   Ileus (HCC) 04/21/2018   Postoperative ileus (HCC) 04/20/2018   Postoperative state 04/15/2018   Uterine leiomyoma 03/19/2018   Menorrhagia with regular cycle 03/19/2018   Chronic pain disorder 03/19/2018   Pelvic pain 03/19/2018   Primary hypertension 03/19/2018   PCP:  Cl[facility]:   [facility] [place] - [address] [place] [address] Phone: [phone] Fax: [phone]  [facility] [address] [place], [place] - [address] [place] [address] Phone: [phone] Fax: [phone]     Social Drivers of Health (SDOH) Social History: SDOH Screenings   Food Insecurity: No Food Insecurity (11/05/2023)  Housing: Low Risk  (11/05/2023)  Recent Concern: Housing - High Risk  (09/15/2023)   Received from [facility]  Transportation Needs: No Transportation Needs (11/05/2023)  Utilities: Not At Risk (11/05/2023)  Financial Resource Strain: High Risk (07/19/2023)   Received from [facility]  Social Connections: Moderately Isolated (06/05/2021)   Received from [facility]  Stress: No Stress Concern Present (08/31/2023)   Received from [facility]  Tobacco Use: High Risk (11/04/2023)   SDOH Interventions:     Readmission Risk Interventions    11/08/2023    2:16 PM 10/24/2023   11:27 AM 09/20/2023   10:58 AM  Readmission Risk Prevention Plan  Transportation Screening Complete Complete Complete  Medication Review [employer]) -- Complete Complete  PCP or Specialist appointment within 3-5 days of discharge Complete Complete Complete  HRI or Home Care Consult Not Complete  Complete  HRI or Home Care Consult Pt Refusal Comments Not applicable    SW Recovery Care/Counseling Consult  Complete Complete  Palliative Care Screening Not Applicable Not Applicable Not Applicable  Skilled Nursing Facility Not Applicable Not Applicable Not Applicable

## 2023-11-08 NOTE — Progress Notes (Signed)
 Progress Note   Patient: Sarah Sosa ZOX:[id number] DOB: [birth year] DOA: 11/04/2023     3 DOS: the patient was seen and examined on 11/08/2023   Brief hospital course: 43yo with h/o mild intermittent asthma, chronic alcoholic pancreatitis, chronic pain, AS s/p AVR on Coumadin , DM, HTN, chronic HFpEF, and morbid obesity who presented on 4/20 with abdominal pain and n/v.  She was diagnosed with acute on chronic alcoholic pancreatitis and started on IVF and pain medication.  Assessment and Plan:  Acute alcoholic pancreatitis Now tolerating clears; will stop IVF Creon  36,000 units PO tid  IV dilaudid  2 mg q3 hr PRN spaced to q4h, cut to 1 mg q4h prn to 0.5 mg q4h prn Oxycodone  5 mg q4 hr PRN  Added back Suboxone  on 4/24 and dc Dilaudid  However, she reported severe pain with eating and requested resumption of IV Dilaudid ; 0.5 mg q4h prn added with no escalation and diet will be changed back to CLD If ongoing IV pain medication is needed tomorrow, will plan to change to Toradol  She will not be discharged on additional opiates and so oxy will be weaned once Dilaudid  is off   Chronic pain syndrome I have reviewed this patient in the [place] Controlled Substances Reporting System.  She is receiving medications from multiple providers. She IS at particularly high risk of opioid misuse, diversion, or overdose.   Her UDS was positive for methadone  despite no Rx for this medication since 07/31/23 (30 day supply) She is supposed to be taking suboxone ; will add that back for her on 4/24 to try to limit her opiate needs   Recurrent alcoholic hepatitis Outpatient f/u with GI Stop alcohol - counseled about this    Alcohol intoxication ETOH level 161 on presentation Folic acid  1 mg PO daily  MVI PO daily  Thiamine  100 mg PO daily  Start CIWA protocol with sparing benzos   HTN Chronic HFpEF Continue amlodipine , losartan , and spironolactone  Hold Lasix  for now   Aortic stenosis status post metal  AVR No acute concern, consult pharmacy for redosing Coumadin  INR goal 1.5-2 per anticoagulation clinic notes   IIDM Recent A1c was 6.4, good control Hold Glucophage  Cover with moderate-scale SSI Carb modified diet    Asthma/COPD No symptoms signs of acute exacerbation Continue as needed albuterol          Consultants: None   Procedures: None   Antibiotics: None   30 Day Unplanned Readmission Risk Score    Flowsheet Row ED to Hosp-Admission (Current) from 11/04/2023 in [facility] (1A)  30 Day Unplanned Readmission Risk Score (%) 57.7 Filed at 11/08/2023 0801       This score is the patient's risk of an unplanned readmission within 30 days of being discharged (0 -100%). The score is based on dignosis, age, lab data, medications, orders, and past utilization.   Low:  0-14.9   Medium: 15-21.9   High: 22-29.9   Extreme: 30 and above           Subjective: Reports ongoing and uncontrolled pain.  But still able to tolerate a soft diet - no n/v but it "exacerbates" her pain.  We discussed today and she would like to still get Dilaudid  and be on a soft diet.  I explained that the IV pain medication will be discontinued if her diet stays soft.  She now says she prefers to go back to CLD and IV Dilaudid .   Objective: Vitals:   11/08/23 0734 11/08/23 1507  BP:  119/63 129/69  Pulse: 70 74  Resp: 17 16  Temp: 98.3 F (36.8 C) 98.3 F (36.8 C)  SpO2: 97% 100%    Intake/Output Summary (Last 24 hours) at 11/08/2023 1721 Last data filed at 11/08/2023 1200 Gross per 24 hour  Intake 360 ml  Output --  Net 360 ml   Filed Weights   11/04/23 0552  Weight: 120.6 kg    Exam:  General:  Appears calm and comfortable and is in NAD Eyes:  EOMI, normal lids, iris ENT:  grossly normal hearing, lips & tongue, mmm Neck:  no LAD, masses or thyromegaly Cardiovascular:  RRR, valvular click appreciated. No LE edema.  Respiratory:   CTA bilaterally  with no wheezes/rales/rhonchi.  Normal respiratory effort. Abdomen:  soft, NT, ND Skin:  no rash or induration seen on limited exam Musculoskeletal:  grossly normal tone BUE/BLE, good ROM, no bony abnormality Psychiatric:  grossly normal mood and affect, speech fluent and appropriate, AOx3 Neurologic:  CN 2-12 grossly intact, moves all extremities in coordinated fashion  Data Reviewed: I have reviewed the patient's lab results since admission.  Pertinent labs for today include:   K+ 3.3 Glucose 110 Albumin 3.1 WBC 4 Hgb 11.7, stable INR 1.7     Family Communication: None present  Disposition: Status is: Inpatient Remains inpatient appropriate because: ongoing tapering of narcotics     Time spent: 50 minutes  Unresulted Labs (From admission, onward)     Start     Ordered   11/05/23 0500  Protime-INR  Daily,   R      11/04/23 1306             Author: Lorita Rosa, MD 11/08/2023 5:21 PM  For on call review [email or web address].

## 2023-11-08 NOTE — Plan of Care (Signed)
  Problem: Education: Goal: Ability to describe self-care measures that may prevent or decrease complications (Diabetes Survival Skills Education) will improve Outcome: Progressing Goal: Individualized Educational Video(s) Outcome: Progressing   Problem: Coping: Goal: Ability to adjust to condition or change in health will improve Outcome: Progressing   Problem: Health Behavior/Discharge Planning: Goal: Ability to identify and utilize available resources and services will improve Outcome: Progressing

## 2023-11-09 DIAGNOSIS — K852 Alcohol induced acute pancreatitis without necrosis or infection: Secondary | ICD-10-CM | POA: Diagnosis not present

## 2023-11-09 LAB — BASIC METABOLIC PANEL WITH GFR
Anion gap: 5 (ref 5–15)
BUN: <5 mg/dL — ABNORMAL LOW (ref 6–20)
CO2: 22 mmol/L (ref 22–32)
Calcium: 8.8 mg/dL — ABNORMAL LOW (ref 8.9–10.3)
Chloride: 107 mmol/L (ref 98–111)
Creatinine, Ser: 0.9 mg/dL (ref 0.44–1.00)
GFR, Estimated: >60 mL/min (ref >60)
Glucose, Bld: 117 mg/dL — ABNORMAL HIGH (ref 70–99)
Potassium: 3.5 mmol/L (ref 3.5–5.1)
Sodium: 134 mmol/L — ABNORMAL LOW (ref 135–145)

## 2023-11-09 LAB — CBC WITH DIFFERENTIAL/PLATELET
Abs Immature Granulocytes: 0.03 10*3/uL (ref 0.00–0.07)
Basophils Absolute: 0 10*3/uL (ref 0.0–0.1)
Basophils Relative: 1 %
Eosinophils Absolute: 0.3 10*3/uL (ref 0.0–0.5)
Eosinophils Relative: 7 %
HCT: 37.7 % (ref 36.0–46.0)
Hemoglobin: 12.8 g/dL (ref 12.0–15.0)
Immature Granulocytes: 1 %
Lymphocytes Relative: 41 %
Lymphs Abs: 1.6 10*3/uL (ref 0.7–4.0)
MCH: 31 pg (ref 26.0–34.0)
MCHC: 34 g/dL (ref 30.0–36.0)
MCV: 91.3 fL (ref 80.0–100.0)
Monocytes Absolute: 0.2 10*3/uL (ref 0.1–1.0)
Monocytes Relative: 6 %
Neutro Abs: 1.8 10*3/uL (ref 1.7–7.7)
Neutrophils Relative %: 44 %
Platelets: 115 10*3/uL — ABNORMAL LOW (ref 150–400)
RBC: 4.13 MIL/uL (ref 3.87–5.11)
RDW: 13.3 % (ref 11.5–15.5)
Smear Review: NORMAL
WBC: 3.9 10*3/uL — ABNORMAL LOW (ref 4.0–10.5)
nRBC: 0 % (ref 0.0–0.2)

## 2023-11-09 LAB — GLUCOSE, CAPILLARY
Glucose-Capillary: 113 mg/dL — ABNORMAL HIGH (ref 70–99)
Glucose-Capillary: 155 mg/dL — ABNORMAL HIGH (ref 70–99)

## 2023-11-09 LAB — PROTIME-INR
INR: 1.3 — ABNORMAL HIGH (ref 0.8–1.2)
Prothrombin Time: 16.6 s — ABNORMAL HIGH (ref 11.4–15.2)

## 2023-11-09 MED ORDER — BUPRENORPHINE HCL-NALOXONE HCL 8-2 MG SL SUBL: 3.0000 | SUBLINGUAL_TABLET | Freq: Every day | SUBLINGUAL | Status: DC

## 2023-11-09 MED ORDER — IOHEXOL 9 MG/ML PO SOLN
500.0000 mL | ORAL | Status: AC
  Administered 2023-11-09 (×2): 500 mL via ORAL

## 2023-11-09 MED ORDER — WARFARIN SODIUM 7.5 MG PO TABS
7.5000 mg | ORAL_TABLET | Freq: Once | ORAL | Status: DC
  Filled 2023-11-09: qty 1

## 2023-11-09 NOTE — Plan of Care (Signed)
  Problem: Education: Goal: Ability to describe self-care measures that may prevent or decrease complications (Diabetes Survival Skills Education) will improve Outcome: Progressing Goal: Individualized Educational Video(s) Outcome: Not Applicable   Problem: Coping: Goal: Ability to adjust to condition or change in health will improve Outcome: Progressing   Problem: Fluid Volume: Goal: Ability to maintain a balanced intake and output will improve Outcome: Progressing   Problem: Health Behavior/Discharge Planning: Goal: Ability to identify and utilize available resources and services will improve Outcome: Progressing Goal: Ability to manage health-related needs will improve Outcome: Progressing   Problem: Metabolic: Goal: Ability to maintain appropriate glucose levels will improve Outcome: Progressing   Problem: Nutritional: Goal: Maintenance of adequate nutrition will improve Outcome: Progressing Goal: Progress toward achieving an optimal weight will improve Outcome: Progressing   Problem: Skin Integrity: Goal: Risk for impaired skin integrity will decrease Outcome: Progressing   Problem: Tissue Perfusion: Goal: Adequacy of tissue perfusion will improve Outcome: Progressing   Problem: Education: Goal: Knowledge of General Education information will improve Description: Including pain rating scale, medication(s)/side effects and non-pharmacologic comfort measures Outcome: Progressing   Problem: Health Behavior/Discharge Planning: Goal: Ability to manage health-related needs will improve Outcome: Progressing   Problem: Clinical Measurements: Goal: Ability to maintain clinical measurements within normal limits will improve Outcome: Progressing Goal: Will remain free from infection Outcome: Progressing Goal: Diagnostic test results will improve Outcome: Progressing Goal: Respiratory complications will improve Outcome: Progressing Goal: Cardiovascular complication  will be avoided Outcome: Progressing   Problem: Activity: Goal: Risk for activity intolerance will decrease Outcome: Progressing   Problem: Nutrition: Goal: Adequate nutrition will be maintained Outcome: Progressing   Problem: Coping: Goal: Level of anxiety will decrease Outcome: Progressing   Problem: Elimination: Goal: Will not experience complications related to bowel motility Outcome: Progressing Goal: Will not experience complications related to urinary retention Outcome: Progressing   Problem: Pain Managment: Goal: General experience of comfort will improve and/or be controlled Outcome: Not Progressing   Problem: Safety: Goal: Ability to remain free from injury will improve Outcome: Progressing   Problem: Skin Integrity: Goal: Risk for impaired skin integrity will decrease Outcome: Progressing

## 2023-11-09 NOTE — Discharge Summary (Signed)
 Physician Discharge Summary   Patient: Sarah Sosa MRN: [id number] DOB: [birth year]  Admit date:     11/04/2023  Discharge date: 11/09/23  Discharge Physician: Lorita Rosa   PCP: [facility]   Recommendations at discharge:   You have declined to have another CT scan to assess your abdominal pain STOP using all alcohol and illicit drugs Your current issues appear to be related to chronic pain and/or substance use d/o rather than to acute pancreatitis Resume home Suboxone  (last prescribed on 4/1) and follow up with pain clinic Clear liquid diet, advance as tolerated Continue Coumadin  with INR goal 1.5-2 as per anticoagulation clinic Follow up with [facility] PCP in 1-2 weeks Hold semaglutide until follow up appointment  Discharge Diagnoses: Active Problems:   Acute alcoholic pancreatitis   S/p aortic valve replacement with 'On-X" valve (INR target of 1.5 to 2) and ascending aortic graft ([facility], 03/2022)   Primary hypertension   Tobacco abuse   History of substance abuse (HCC)   Marijuana use   Obesity, Class III, BMI 40-49.9 (morbid obesity) (HCC)   History of heavy alcohol consumption   Chronic pain syndrome   Type 2 diabetes mellitus without complications [facility])    Hospital Course: 43yo with h/o mild intermittent asthma, chronic alcoholic pancreatitis, chronic pain, AS s/p AVR on Coumadin , DM, HTN, chronic HFpEF, and morbid obesity who presented on 4/20 with abdominal pain and n/v.  She was diagnosed with acute on chronic alcoholic pancreatitis and started on IVF and pain medication.  Assessment and Plan:  Acute alcoholic pancreatitis Now tolerating clears, can advance as tolerated at home Creon  36,000 units PO tid  IV dilaudid  stopped yesterday Oxycodone  5 mg q4 hr PRN will be stopped today at time of dc Added back Suboxone  on 4/24  She has reported severe pain with eating and diet was be changed back to CLD Last CT on 3/30 without evidence of  pancreatitis Offered CT today for re-revaluation and consideration of GI consult - she declined Will dc to home She will not be discharged on additional opiates She appears to have a chronic pain condition that is contributing to her recurrent abdominal pain; there was evidence of drug-seeking behavior throughout the hospitalization As such, would recommend careful evaluation for strong evidence of acute pancreatitis prior to future admissions   Chronic pain syndrome I have reviewed this patient in the [place] Controlled Substances Reporting System.  She is receiving medications from multiple providers. She IS at particularly high risk of opioid misuse, diversion, or overdose.   Her UDS was positive for methadone  despite no Rx for this medication since 07/31/23 (30 day supply); also positive for opiates, TCA, and THC Suboxone  was added back on 4/24 to try to limit her opiate needs Encourage outpatient pain management follow up, as chronic pain and/or substance use d/o appear to be her primary issues  Recurrent alcoholic hepatitis Outpatient f/u with GI Stop alcohol - counseled about this    Alcohol intoxication ETOH level 161 on presentation Folic acid  1 mg PO daily  MVI PO daily  Thiamine  100 mg PO daily  Start CIWA protocol with sparing benzos   HTN Chronic HFpEF Continue amlodipine , losartan , and spironolactone  Hold Lasix  for now   Aortic stenosis status post metal AVR No acute concern, consult pharmacy for redosing Coumadin  INR goal 1.5-2 per anticoagulation clinic notes   IIDM Recent A1c was 6.4, good control Hold Glucophage  Cover with moderate-scale SSI Carb modified diet    Asthma/COPD No symptoms signs  of acute exacerbation Continue as needed albuterol   Morbid obesity Body mass index is 42.91 kg/m.Aaron Aas  Weight loss should be encouraged Outpatient PCP/bariatric medicine f/u encouraged Significantly low or high BMI is associated with higher medical risk including  morbidity and mortality         Consultants: None   Procedures: None   Antibiotics: None   Pain control - [place]  Controlled Substance Reporting System database was reviewed. and patient was instructed, not to drive, operate heavy machinery, perform activities at heights, swimming or participation in water activities or provide baby-sitting services while on Pain, Sleep and Anxiety Medications; until their outpatient Physician has advised to do so again. Also recommended to not to take more than prescribed Pain, Sleep and Anxiety Medications.    Disposition: Home Diet recommendation:  Clear liquids, advance to Carb modified diet as tolerated DISCHARGE MEDICATION: Allergies as of 11/09/2023       Reactions   Latex Anaphylaxis, Hives, Itching   Other reaction(s): Other (See Comments) SKIN BURNING & PEELING   Nsaids Other (See Comments)   Serve ulcers; stomach aches, GERD. Other Reaction(s): Other (See Comments)   Tomato Anaphylaxis, Hives, Itching   Gabapentin  Palpitations   Has heart condition; also makes jittery   Metformin     Other Reaction(s): Abdominal Pain Causes pancreatitis to worsen   Lisinopril     Other Reaction(s): Angioedema   Tramadol Itching, Hives   Duloxetine Anxiety   Makes her jittery        Medication List     PAUSE taking these medications    Semaglutide (1 MG/DOSE) 4 MG/3ML Sopn Wait to take this until your doctor or other care provider tells you to start again. Inject 0.75 mLs into the skin once a week.       STOP taking these medications    methadone  10 MG tablet Commonly known as: DOLOPHINE    oxyCODONE  5 MG immediate release tablet Commonly known as: Oxy IR/ROXICODONE        TAKE these medications    albuterol  108 (90 Base) MCG/ACT inhaler Commonly known as: VENTOLIN  HFA Inhale 1-2 puffs into the lungs every 6 (six) hours as needed.   amitriptyline  10 MG tablet Commonly known as: ELAVIL  Take 10 mg by mouth at  bedtime as needed for sleep.   amLODipine  10 MG tablet Commonly known as: NORVASC  Take 1 tablet (10 mg total) by mouth daily.   atorvastatin  40 MG tablet Commonly known as: LIPITOR Take 40 mg by mouth daily.   buprenorphine -naloxone  8-2 mg Subl SL tablet Commonly known as: SUBOXONE  Place 3 tablets under the tongue daily. Start taking on: November 10, 2023   furosemide  40 MG tablet Commonly known as: LASIX  Take 0.5 tablets (20 mg total) by mouth daily as needed. Home med.   lipase/protease/amylase 78938 UNITS Cpep capsule Commonly known as: CREON  Take 1 capsule (36,000 Units total) by mouth 3 (three) times daily with meals.   losartan  50 MG tablet Commonly known as: COZAAR  Take 1 tablet (50 mg total) by mouth daily.   metFORMIN  500 MG tablet Commonly known as: GLUCOPHAGE  Take 500 mg by mouth every morning.   naloxone  4 MG/0.1ML Liqd nasal spray kit Commonly known as: NARCAN  Place 1 spray into the nose once as needed (to reverse overdose).   omeprazole 40 MG capsule Commonly known as: PRILOSEC Take 40 mg by mouth daily.   ondansetron  4 MG tablet Commonly known as: Zofran  Take 1 tablet (4 mg total) by mouth daily as needed for nausea  or vomiting.   spironolactone  25 MG tablet Commonly known as: ALDACTONE  Take 25 mg by mouth daily.   traZODone  50 MG tablet Commonly known as: DESYREL  Take 100 mg by mouth at bedtime.   warfarin 7.5 MG tablet Commonly known as: COUMADIN  Take 3.25-7.5 mg by mouth daily. Take 3.75mg   M/T/W/F/S/Sun and Th 7.5mg  (1 tab)        Discharge Exam:   Subjective: Reports ongoing abdominal pain with eating - but is documented to be eating meals regardless.  Declined CT scan for further evaluation.  Understands plan for discharge.   Objective: Vitals:   11/09/23 0508 11/09/23 0811  BP: 131/86 130/72  Pulse: 80 80  Resp: 14 16  Temp: 98.6 F (37 C) 98.7 F (37.1 C)  SpO2: 99% 97%    Intake/Output Summary (Last 24 hours) at  11/09/2023 1226 Last data filed at 11/09/2023 0900 Gross per 24 hour  Intake 720 ml  Output --  Net 720 ml   Filed Weights   11/04/23 0552  Weight: 120.6 kg    Exam:  General:  Appears calm and comfortable and is in NAD Eyes:  EOMI, normal lids, iris ENT:  grossly normal hearing, lips & tongue, mmm Neck:  no LAD, masses or thyromegaly Cardiovascular:  RRR, valvular click appreciated. No LE edema.  Respiratory:   CTA bilaterally with no wheezes/rales/rhonchi.  Normal respiratory effort. Abdomen:  soft, NT, ND Skin:  no rash or induration seen on limited exam Musculoskeletal:  grossly normal tone BUE/BLE, good ROM, no bony abnormality Psychiatric:  grossly normal mood and affect, speech fluent and appropriate, AOx3 Neurologic:  CN 2-12 grossly intact, moves all extremities in coordinated fashion  Data Reviewed: I have reviewed the patient's lab results since admission.  Pertinent labs for today include:  Na++ 134, not clinically significant Glucose 117 WBC 3.9 Platelets 115 INR 1.3     Condition at discharge: stable  The results of significant diagnostics from this hospitalization (including imaging, microbiology, ancillary and laboratory) are listed below for reference.   Imaging Studies: CT ABDOMEN PELVIS W CONTRAST Result Date: 10/14/2023 CLINICAL DATA:  Acute, nonlocalized abdominal pain EXAM: CT ABDOMEN AND PELVIS WITH CONTRAST TECHNIQUE: Multidetector CT imaging of the abdomen and pelvis was performed using the standard protocol following bolus administration of intravenous contrast. RADIATION DOSE REDUCTION: This exam was performed according to the departmental dose-optimization program which includes automated exposure control, adjustment of the mA and/or kV according to patient size and/or use of iterative reconstruction technique. CONTRAST:  OMNIPAQUE  IOHEXOL  300 MG/ML  SOLN COMPARISON:  09/17/2023 FINDINGS: Lower chest: Aortic valve replacement partially  covered. No acute finding Hepatobiliary: No focal liver abnormality.Cholecystectomy. Pancreas: Generalized atrophy for age. Acute inflammatory changes on prior have resolved. Spleen: Unremarkable. Adrenals/Urinary Tract: Negative adrenals. No hydronephrosis or stone. Unremarkable bladder. Stomach/Bowel:  No obstruction. No appendicitis. Vascular/Lymphatic: No acute vascular abnormality. Premature atheromatous calcification of the aorta and iliacs. No mass or adenopathy. Reproductive:Hysterectomy with symmetric adnexa. Other: No ascites or pneumoperitoneum. Twisted and somewhat hazy ileocolic mesentery is unchanged and likely from old insult. Musculoskeletal: No acute abnormalities. Degenerative spurring in the lumbar facets with sclerosis and spurring at the bilateral sacroiliac joints. IMPRESSION: No acute finding.  Resolved pancreatitis finding seen on prior. Electronically Signed   By: Ronnette Coke M.D.   On: 10/14/2023 07:48    Microbiology: Results for orders placed or performed during the hospital encounter of 08/03/23  Resp panel by RT-PCR (RSV, Flu A&B, Covid) Anterior Nasal Swab  Status: None   Collection Time: 08/03/23 10:11 AM   Specimen: Anterior Nasal Swab  Result Value Ref Range Status   SARS Coronavirus 2 by RT PCR NEGATIVE NEGATIVE Final    Comment: (NOTE) SARS-CoV-2 target nucleic acids are NOT DETECTED.  The SARS-CoV-2 RNA is generally detectable in upper respiratory specimens during the acute phase of infection. The lowest concentration of SARS-CoV-2 viral copies this assay can detect is 138 copies/mL. A negative result does not preclude SARS-Cov-2 infection and should not be used as the sole basis for treatment or other patient management decisions. A negative result may occur with  improper specimen collection/handling, submission of specimen other than nasopharyngeal swab, presence of viral mutation(s) within the areas targeted by this assay, and inadequate number of  viral copies(<138 copies/mL). A negative result must be combined with clinical observations, patient history, and epidemiological information. The expected result is Negative.  Fact Sheet for Patients:  BloggerCourse.com  Fact Sheet for Healthcare Providers:  SeriousBroker.it  This test is no t yet approved or cleared by the [place]  FDA and  has been authorized for detection and/or diagnosis of SARS-CoV-2 by FDA under an Emergency Use Authorization (EUA). This EUA will remain  in effect (meaning this test can be used) for the duration of the COVID-19 declaration under Section 564(b)(1) of the Act, 21 U.S.C.section 360bbb-3(b)(1), unless the authorization is terminated  or revoked sooner.       Influenza A by PCR NEGATIVE NEGATIVE Final   Influenza B by PCR NEGATIVE NEGATIVE Final    Comment: (NOTE) The Xpert Xpress SARS-CoV-2/FLU/RSV plus assay is intended as an aid in the diagnosis of influenza from Nasopharyngeal swab specimens and should not be used as a sole basis for treatment. Nasal washings and aspirates are unacceptable for Xpert Xpress SARS-CoV-2/FLU/RSV testing.  Fact Sheet for Patients: BloggerCourse.com  Fact Sheet for Healthcare Providers: SeriousBroker.it  This test is not yet approved or cleared by the [place]  FDA and has been authorized for detection and/or diagnosis of SARS-CoV-2 by FDA under an Emergency Use Authorization (EUA). This EUA will remain in effect (meaning this test can be used) for the duration of the COVID-19 declaration under Section 564(b)(1) of the Act, 21 U.S.C. section 360bbb-3(b)(1), unless the authorization is terminated or revoked.     Resp Syncytial Virus by PCR NEGATIVE NEGATIVE Final    Comment: (NOTE) Fact Sheet for Patients: BloggerCourse.com  Fact Sheet for Healthcare  Providers: SeriousBroker.it  This test is not yet approved or cleared by the [place]  FDA and has been authorized for detection and/or diagnosis of SARS-CoV-2 by FDA under an Emergency Use Authorization (EUA). This EUA will remain in effect (meaning this test can be used) for the duration of the COVID-19 declaration under Section 564(b)(1) of the Act, 21 U.S.C. section 360bbb-3(b)(1), unless the authorization is terminated or revoked.  Performed at [facility], [address] [place], [place] [address]     Labs: CBC: Recent Labs  Lab 11/04/23 [phone] 11/05/23 0751 11/06/23 [phone] 11/07/23 0332 11/08/23 0731 11/09/23 0757  WBC 7.5 6.7 4.6 3.2* 4.0 3.9*  NEUTROABS 4.3  --   --  1.3* 1.8 1.8  HGB 14.9 12.7 12.3 11.3* 11.7* 12.8  HCT 44.1 37.3 36.4 33.1* 34.9* 37.7  MCV 91.1 90.3 91.5 90.4 92.3 91.3  PLT 350 257 223 203 200 115*   Basic Metabolic Panel: Recent Labs  Lab 11/04/23 0629 11/05/23 0612 11/06/23 0611 11/07/23 0332 11/08/23 0731 11/09/23 0757  NA 141  137 138 137 138 134*  K 2.9* 3.3* 3.5 3.3* 3.3* 3.5  CL 102 102 106 107 106 107  CO2 21* 24 25 24 25 22   GLUCOSE 131* 101* 125* 127* 110* 117*  BUN 6 5* <5* <5* <5* <5*  CREATININE 0.64 0.66 0.77 0.70 0.85 0.90  CALCIUM  9.1 8.9 8.8* 8.3* 8.5* 8.8*  MG 1.5* 1.7 2.0  --   --   --   PHOS 2.4*  --  2.2*  --   --   --    Liver Function Tests: Recent Labs  Lab 11/04/23 0629 11/06/23 0611 11/08/23 0731  AST 23 53* 15  ALT 12 18 9   ALKPHOS 78 90 62  BILITOT 1.4* 1.2 0.5  PROT 8.0 6.7 6.2*  ALBUMIN 4.4 3.6 3.1*   CBG: Recent Labs  Lab 11/08/23 1152 11/08/23 1706 11/08/23 2131 11/09/23 0819 11/09/23 1137  GLUCAP 109* 111* 144* 113* 155*    Discharge time spent: greater than 30 minutes.  Signed: Lorita Rosa, MD [facility] 11/09/2023

## 2023-11-09 NOTE — Consult Note (Signed)
 PHARMACY - ANTICOAGULATION CONSULT NOTE  Pharmacy Consult for warfarin dosing Indication: aortic valve replacement with On-X  Allergies  Allergen Reactions   Latex Anaphylaxis, Hives and Itching    Other reaction(s): Other (See Comments) SKIN BURNING & PEELING    Nsaids Other (See Comments)    Serve ulcers; stomach aches, GERD.  Other Reaction(s): Other (See Comments)   Tomato Anaphylaxis, Hives and Itching   Gabapentin  Palpitations    Has heart condition; also makes jittery   Metformin      Other Reaction(s): Abdominal Pain  Causes pancreatitis to worsen   Lisinopril      Other Reaction(s): Angioedema   Tramadol Itching and Hives   Duloxetine Anxiety    Makes her jittery    Patient Measurements: Height: 5\' 6"  (167.6 cm) Weight: 120.6 kg (265 lb 14 oz) IBW/kg (Calculated) : 59.3 HEPARIN  DW (KG): 88.1  Vital Signs: Temp: 98.7 F (37.1 C) (04/25 0811) BP: 130/72 (04/25 0811) Pulse Rate: 80 (04/25 0811)  Labs: Recent Labs    11/07/23 0332 11/08/23 0731 11/09/23 0757 11/09/23 1052  HGB 11.3* 11.7* 12.8  --   HCT 33.1* 34.9* 37.7  --   PLT 203 200 115*  --   LABPROT 20.2* 20.1*  --  16.6*  INR 1.7* 1.7*  --  1.3*  CREATININE 0.70 0.85 0.90  --     Estimated Creatinine Clearance: 107.7 mL/min (by C-G formula based on SCr of 0.9 mg/dL).   Medical History: Past Medical History:  Diagnosis Date   Arthritis    Asthma    Chronic pain disorder 03/19/2018   GERD (gastroesophageal reflux disease)    Headache    Heart murmur    History of trichomoniasis 03/19/2018   Hypertension    Intractable nausea and vomiting    Pancreatitis    PID (pelvic inflammatory disease) 02/14/2018   Uterine leiomyoma 03/19/2018    Medications:  Per last Anticoagulation clinic visit on 10/19/23 warfarin dose reduced to 3.375 mg daily.  Last dose per Med rec 4/19 am  Assessment: 43 yo female presenting to the ED with abdominal pain.  PMH includes asthma, chronic pancreatitis,  chronic pain syndrome, AVR on chronic anticoagulation, T2DM, HTN, and HFpEF.  Patient being admitted for treatment of acute on chronic pancreatitis.  Goal of Therapy:  INR 1.5-2.0 per Anticoag clinic notes Monitor platelets by anticoagulation protocol: Yes    Date INR Warfarin Dose  4/20 1.2 7.5 mg  4/21 1.2 5 mg  4/22 1.5 5 mg  4/23 1.7 5 mg  4/24 1.7 5 mg  4/25 1.3      Plan:  INR subtherapeutic this morning at 1.3 (goal INR 1.5-2.0) CBC stable, no signs/symptoms of bleeding reported, no new drug interactions noted Give warfarin 7.5 mg x1 this afternoon Daily INR while on inpatient and CBC at least weekly  Thank you for involving pharmacy in this patient's care.   Ananias Balls, PharmD Clinical Pharmacist 11/09/2023 12:12 PM

## 2023-11-09 NOTE — TOC Transition Note (Signed)
 Transition of Care [facility]) - Discharge Note   Patient Details  Name: Sarah Sosa MRN: [id number] Date of Birth: [birth year]  Transition of Care [facility]) CM/SW Contact:  Crayton Docker, RN 11/09/2023, 1:39 PM   Clinical Narrative:     Discharge orders noted, discharge summary noted. Patient discharged to home/self care.   Final next level of care: Home/Self Care Barriers to Discharge: No Barriers Identified   Patient Goals and CMS Choice      Home/self care    Discharge Placement    Home/self care            Discharge Plan and Services Additional resources added to the After Visit Summary for     Discharge Planning Services: CM Consult             Home/self care   Social Drivers of Health (SDOH) Interventions SDOH Screenings   Food Insecurity: No Food Insecurity (11/05/2023)  Housing: Low Risk  (11/05/2023)  Recent Concern: Housing - High Risk (09/15/2023)   Received from [facility]  Transportation Needs: No Transportation Needs (11/05/2023)  Utilities: Not At Risk (11/05/2023)  Financial Resource Strain: High Risk (07/19/2023)   Received from [facility]  Social Connections: Moderately Isolated (06/05/2021)   Received from [facility], [facility]  Stress: No Stress Concern Present (08/31/2023)   Received from [facility]  Tobacco Use: High Risk (11/04/2023)     Readmission Risk Interventions    11/08/2023    2:16 PM 10/24/2023   11:27 AM 09/20/2023   10:58 AM  Readmission Risk Prevention Plan  Transportation Screening Complete Complete Complete  Medication Review Oceanographer) -- Complete Complete  PCP or Specialist appointment within 3-5 days of discharge Complete Complete Complete  HRI or Home Care Consult Not Complete  Complete  HRI or Home Care Consult Pt Refusal Comments Not applicable    SW Recovery Care/Counseling Consult  Complete Complete  Palliative Care  Screening Not Applicable Not Applicable Not Applicable  Skilled Nursing Facility Not Applicable Not Applicable Not Applicable

## 2023-12-06 ENCOUNTER — Emergency Department
Admission: EM | Admit: 2023-12-06 | Discharge: 2023-12-06 | Disposition: A | Discharge status: Home / Self Care | Attending: Emergency Medicine | Admitting: Emergency Medicine

## 2023-12-06 ENCOUNTER — Other Ambulatory Visit: Payer: Self-pay

## 2023-12-06 ENCOUNTER — Encounter: Payer: Self-pay | Admitting: Emergency Medicine

## 2023-12-06 DIAGNOSIS — Z7901 Long term (current) use of anticoagulants: Secondary | ICD-10-CM | POA: Insufficient documentation

## 2023-12-06 DIAGNOSIS — R791 Abnormal coagulation profile: Secondary | ICD-10-CM | POA: Insufficient documentation

## 2023-12-06 DIAGNOSIS — I509 Heart failure, unspecified: Secondary | ICD-10-CM | POA: Diagnosis not present

## 2023-12-06 DIAGNOSIS — R1013 Epigastric pain: Secondary | ICD-10-CM | POA: Insufficient documentation

## 2023-12-06 DIAGNOSIS — E1159 Type 2 diabetes mellitus with other circulatory complications: Secondary | ICD-10-CM | POA: Insufficient documentation

## 2023-12-06 DIAGNOSIS — G8929 Other chronic pain: Secondary | ICD-10-CM

## 2023-12-06 DIAGNOSIS — I110 Hypertensive heart disease with heart failure: Secondary | ICD-10-CM | POA: Diagnosis not present

## 2023-12-06 DIAGNOSIS — J45909 Unspecified asthma, uncomplicated: Secondary | ICD-10-CM | POA: Insufficient documentation

## 2023-12-06 LAB — COMPREHENSIVE METABOLIC PANEL WITH GFR
ALT: 47 U/L — ABNORMAL HIGH (ref 0–44)
AST: 46 U/L — ABNORMAL HIGH (ref 15–41)
Albumin: 3.4 g/dL — ABNORMAL LOW (ref 3.5–5.0)
Alkaline Phosphatase: 56 U/L (ref 38–126)
Anion gap: 7 (ref 5–15)
BUN: 6 mg/dL (ref 6–20)
CO2: 23 mmol/L (ref 22–32)
Calcium: 8.7 mg/dL — ABNORMAL LOW (ref 8.9–10.3)
Chloride: 110 mmol/L (ref 98–111)
Creatinine, Ser: 0.66 mg/dL (ref 0.44–1.00)
GFR, Estimated: >60 mL/min (ref >60)
Glucose, Bld: 109 mg/dL — ABNORMAL HIGH (ref 70–99)
Potassium: 3 mmol/L — ABNORMAL LOW (ref 3.5–5.1)
Sodium: 140 mmol/L (ref 135–145)
Total Bilirubin: 0.8 mg/dL (ref 0.0–1.2)
Total Protein: 6.7 g/dL (ref 6.5–8.1)

## 2023-12-06 LAB — CBC
HCT: 35.6 % — ABNORMAL LOW (ref 36.0–46.0)
Hemoglobin: 12 g/dL (ref 12.0–15.0)
MCH: 30.6 pg (ref 26.0–34.0)
MCHC: 33.7 g/dL (ref 30.0–36.0)
MCV: 90.8 fL (ref 80.0–100.0)
Platelets: 358 10*3/uL (ref 150–400)
RBC: 3.92 MIL/uL (ref 3.87–5.11)
RDW: 14 % (ref 11.5–15.5)
WBC: 4.7 10*3/uL (ref 4.0–10.5)
nRBC: 0 % (ref 0.0–0.2)

## 2023-12-06 LAB — MAGNESIUM: Magnesium: 1.4 mg/dL — ABNORMAL LOW (ref 1.7–2.4)

## 2023-12-06 LAB — LIPASE, BLOOD: Lipase: 23 U/L (ref 11–51)

## 2023-12-06 LAB — PROTIME-INR
INR: 1.2 (ref 0.8–1.2)
Prothrombin Time: 15.9 s — ABNORMAL HIGH (ref 11.4–15.2)

## 2023-12-06 MED ORDER — HYDROMORPHONE HCL 1 MG/ML IJ SOLN
1.0000 mg | Freq: Once | INTRAMUSCULAR | Status: AC
  Administered 2023-12-06: 1 mg via INTRAVENOUS
  Filled 2023-12-06: qty 1

## 2023-12-06 MED ORDER — POTASSIUM CHLORIDE CRYS ER 20 MEQ PO TBCR
40.0000 meq | EXTENDED_RELEASE_TABLET | Freq: Once | ORAL | Status: DC
  Filled 2023-12-06: qty 2

## 2023-12-06 MED ORDER — POTASSIUM CHLORIDE 20 MEQ PO PACK
40.0000 meq | PACK | Freq: Once | ORAL | Status: AC
Start: 2023-12-06 — End: 2023-12-06
  Administered 2023-12-06: 40 meq via ORAL
  Filled 2023-12-06: qty 2

## 2023-12-06 MED ORDER — ONDANSETRON HCL 4 MG/2ML IJ SOLN
4.0000 mg | Freq: Once | INTRAMUSCULAR | Status: AC
  Administered 2023-12-06: 4 mg via INTRAVENOUS
  Filled 2023-12-06: qty 2

## 2023-12-06 MED ORDER — MAGNESIUM SULFATE 2 GM/50ML IV SOLN
2.0000 g | Freq: Once | INTRAVENOUS | Status: AC
  Administered 2023-12-06: 2 g via INTRAVENOUS
  Filled 2023-12-06: qty 50

## 2023-12-06 NOTE — ED Provider Notes (Signed)
 [facility] Provider Note    Event Date/Time   First MD Initiated Contact with Patient 12/06/23 1618     (approximate)   History   Chief Complaint Abdominal Pain   HPI  Sarah Sosa is a 43 y.o. female with past medical history of asthma, chronic pancreatitis, chronic pain syndrome, aortic valve replacement on Coumadin , hypertension, diabetes, and CHF who presents to the ED complaining of abdominal pain.  Patient reports that she has been dealing with increasing epigastric pain over the past 2 days that she describes as sharp, associated with nausea and vomiting.  She states he was recently admitted to [facility] for "liver failure" related to Tylenol  use, and is concerned that she is having similar issues today.  She denies any fevers, dysuria, or flank pain.  She has not had any changes in her bowel movements.     Physical Exam   Triage Vital Signs: ED Triage Vitals  Encounter Vitals Group     BP 12/06/23 1449 (!) 156/92     Systolic BP Percentile --      Diastolic BP Percentile --      Pulse Rate 12/06/23 1449 78     Resp 12/06/23 1449 18     Temp 12/06/23 1449 97.8 F (36.6 C)     Temp Source 12/06/23 1449 Oral     SpO2 12/06/23 1449 98 %     Weight 12/06/23 1448 250 lb (113.4 kg)     Height 12/06/23 1448 5\' 6"  (1.676 m)     Head Circumference --      Peak Flow --      Pain Score 12/06/23 1447 10     Pain Loc --      Pain Education --      Exclude from Growth Chart --     Most recent vital signs: Vitals:   12/06/23 1449  BP: (!) 156/92  Pulse: 78  Resp: 18  Temp: 97.8 F (36.6 C)  SpO2: 98%    Constitutional: Alert and oriented. Eyes: Conjunctivae are normal. Head: Atraumatic. Nose: No congestion/rhinnorhea. Mouth/Throat: Mucous membranes are moist.  Cardiovascular: Normal rate, regular rhythm. Grossly normal heart sounds.  2+ radial pulses bilaterally. Respiratory: Normal respiratory effort.  No retractions. Lungs  CTAB. Gastrointestinal: Soft and tender to palpation in the epigastrium with no rebound or guarding. No distention. Musculoskeletal: No lower extremity tenderness nor edema.  Neurologic:  Normal speech and language. No gross focal neurologic deficits are appreciated.    ED Results / Procedures / Treatments   Labs (all labs ordered are listed, but only abnormal results are displayed) Labs Reviewed  COMPREHENSIVE METABOLIC PANEL WITH GFR - Abnormal; Notable for the following components:      Result Value   Potassium 3.0 (*)    Glucose, Bld 109 (*)    Calcium  8.7 (*)    Albumin 3.4 (*)    AST 46 (*)    ALT 47 (*)    All other components within normal limits  CBC - Abnormal; Notable for the following components:   HCT 35.6 (*)    All other components within normal limits  PROTIME-INR - Abnormal; Notable for the following components:   Prothrombin Time 15.9 (*)    All other components within normal limits  MAGNESIUM  - Abnormal; Notable for the following components:   Magnesium  1.4 (*)    All other components within normal limits  LIPASE, BLOOD  URINALYSIS, ROUTINE W REFLEX MICROSCOPIC  PROCEDURES:  Critical Care performed: No  Procedures   MEDICATIONS ORDERED IN ED: Medications  HYDROmorphone  (DILAUDID ) injection 1 mg (1 mg Intravenous Given 12/06/23 2011)  ondansetron  (ZOFRAN ) injection 4 mg (4 mg Intravenous Given 12/06/23 1958)  magnesium  sulfate IVPB 2 g 50 mL (2 g Intravenous New Bag/Given 12/06/23 2000)  potassium chloride  (KLOR-CON ) packet 40 mEq (40 mEq Oral Given 12/06/23 2007)     IMPRESSION / MDM / ASSESSMENT AND PLAN / ED COURSE  I reviewed the triage vital signs and the nursing notes.                              43 y.o. female with past medical history of hypertension, diabetes, CHF, aortic valve replacement on Coumadin , asthma, chronic pancreatitis, and chronic pain syndrome who presents to the ED complaining of epigastric pain with nausea and vomiting  over the past 2 days.  Patient's presentation is most consistent with acute presentation with potential threat to life or bodily function.  Differential diagnosis includes, but is not limited to, chronic pancreatitis, acute pancreatitis, gastritis, hepatitis, chronic pain syndrome.  Patient nontoxic-appearing and in no acute distress, vital signs are unremarkable.  Her abdomen is soft, she does have some tenderness in her epigastric area but no rebound or guarding noted.  Labs without significant anemia, leukocytosis, or AKI.  She does have mild hypokalemia that we will replete, LFTs with mild transaminitis but improved from her recent stay at [facility].  She had a right upper quadrant ultrasound while at [facility] 11 days ago that was unremarkable, has also had numerous CTs for similar presentations in the past few months.  Do not feel repeat imaging warranted at this time as this appears to be a chronic issue with reassuring labs.  We will treat with IV Dilaudid  and Zofran , hydrate with IV fluids and reassess.  Patient reports symptoms are improved on reassessment, she does have a subtherapeutic INR but states that she was already aware of this and has been prescribed Lovenox , has follow-up scheduled for recheck.  She is appropriate for discharge home with outpatient PCP and GI follow-up, was counseled to return to the ED for new or worsening symptoms.  Patient agrees with plan.      FINAL CLINICAL IMPRESSION(S) / ED DIAGNOSES   Final diagnoses:  Chronic abdominal pain  Subtherapeutic international normalized ratio (INR)     Rx / DC Orders   ED Discharge Orders     None        Note:  This document was prepared using Dragon voice recognition software and may include unintentional dictation errors.   Twilla Galea, MD 12/06/23 2113

## 2023-12-06 NOTE — ED Triage Notes (Signed)
 Pt via POV from home Pt c/o RUQ abd pain and nausea and vomiting since yesterday. Reports hx of liver disease and pancreatitis. Pt is A&OX4 and NAD

## 2023-12-09 ENCOUNTER — Inpatient Hospital Stay
Admission: EM | Admit: 2023-12-09 | Discharge: 2023-12-14 | DRG: 439 | Disposition: A | Discharge status: Home / Self Care | Attending: Osteopathic Medicine | Admitting: Osteopathic Medicine

## 2023-12-09 ENCOUNTER — Emergency Department

## 2023-12-09 ENCOUNTER — Other Ambulatory Visit: Payer: Self-pay

## 2023-12-09 DIAGNOSIS — E119 Type 2 diabetes mellitus without complications: Secondary | ICD-10-CM

## 2023-12-09 DIAGNOSIS — Z7985 Long-term (current) use of injectable non-insulin antidiabetic drugs: Secondary | ICD-10-CM

## 2023-12-09 DIAGNOSIS — Z7901 Long term (current) use of anticoagulants: Secondary | ICD-10-CM

## 2023-12-09 DIAGNOSIS — F17210 Nicotine dependence, cigarettes, uncomplicated: Secondary | ICD-10-CM | POA: Diagnosis present

## 2023-12-09 DIAGNOSIS — G894 Chronic pain syndrome: Secondary | ICD-10-CM | POA: Diagnosis present

## 2023-12-09 DIAGNOSIS — E66812 Obesity, class 2: Secondary | ICD-10-CM | POA: Diagnosis present

## 2023-12-09 DIAGNOSIS — I5030 Unspecified diastolic (congestive) heart failure: Secondary | ICD-10-CM | POA: Diagnosis present

## 2023-12-09 DIAGNOSIS — Z91018 Allergy to other foods: Secondary | ICD-10-CM

## 2023-12-09 DIAGNOSIS — I110 Hypertensive heart disease with heart failure: Secondary | ICD-10-CM | POA: Diagnosis present

## 2023-12-09 DIAGNOSIS — E876 Hypokalemia: Secondary | ICD-10-CM | POA: Diagnosis present

## 2023-12-09 DIAGNOSIS — F1010 Alcohol abuse, uncomplicated: Secondary | ICD-10-CM | POA: Diagnosis present

## 2023-12-09 DIAGNOSIS — Z7984 Long term (current) use of oral hypoglycemic drugs: Secondary | ICD-10-CM

## 2023-12-09 DIAGNOSIS — R1013 Epigastric pain: Secondary | ICD-10-CM

## 2023-12-09 DIAGNOSIS — K219 Gastro-esophageal reflux disease without esophagitis: Secondary | ICD-10-CM | POA: Diagnosis present

## 2023-12-09 DIAGNOSIS — Z952 Presence of prosthetic heart valve: Secondary | ICD-10-CM

## 2023-12-09 DIAGNOSIS — Z8249 Family history of ischemic heart disease and other diseases of the circulatory system: Secondary | ICD-10-CM

## 2023-12-09 DIAGNOSIS — I351 Nonrheumatic aortic (valve) insufficiency: Secondary | ICD-10-CM | POA: Diagnosis present

## 2023-12-09 DIAGNOSIS — Z765 Malingerer [conscious simulation]: Secondary | ICD-10-CM

## 2023-12-09 DIAGNOSIS — I5032 Chronic diastolic (congestive) heart failure: Secondary | ICD-10-CM | POA: Diagnosis present

## 2023-12-09 DIAGNOSIS — F1110 Opioid abuse, uncomplicated: Secondary | ICD-10-CM | POA: Diagnosis present

## 2023-12-09 DIAGNOSIS — I10 Essential (primary) hypertension: Secondary | ICD-10-CM | POA: Diagnosis present

## 2023-12-09 DIAGNOSIS — Y900 Blood alcohol level of less than 20 mg/100 ml: Secondary | ICD-10-CM | POA: Diagnosis present

## 2023-12-09 DIAGNOSIS — K861 Other chronic pancreatitis: Secondary | ICD-10-CM | POA: Diagnosis present

## 2023-12-09 DIAGNOSIS — F1190 Opioid use, unspecified, uncomplicated: Secondary | ICD-10-CM | POA: Diagnosis not present

## 2023-12-09 DIAGNOSIS — Z885 Allergy status to narcotic agent status: Secondary | ICD-10-CM

## 2023-12-09 DIAGNOSIS — K859 Acute pancreatitis without necrosis or infection, unspecified: Secondary | ICD-10-CM | POA: Diagnosis not present

## 2023-12-09 DIAGNOSIS — Z6839 Body mass index (BMI) 39.0-39.9, adult: Secondary | ICD-10-CM

## 2023-12-09 DIAGNOSIS — Z79899 Other long term (current) drug therapy: Secondary | ICD-10-CM

## 2023-12-09 DIAGNOSIS — Z886 Allergy status to analgesic agent status: Secondary | ICD-10-CM

## 2023-12-09 DIAGNOSIS — Z91040 Latex allergy status: Secondary | ICD-10-CM

## 2023-12-09 DIAGNOSIS — Z8261 Family history of arthritis: Secondary | ICD-10-CM

## 2023-12-09 DIAGNOSIS — J45909 Unspecified asthma, uncomplicated: Secondary | ICD-10-CM | POA: Diagnosis present

## 2023-12-09 DIAGNOSIS — Z888 Allergy status to other drugs, medicaments and biological substances status: Secondary | ICD-10-CM

## 2023-12-09 LAB — COMPREHENSIVE METABOLIC PANEL WITH GFR
ALT: 47 U/L — ABNORMAL HIGH (ref 0–44)
AST: 48 U/L — ABNORMAL HIGH (ref 15–41)
Albumin: 4.2 g/dL (ref 3.5–5.0)
Alkaline Phosphatase: 72 U/L (ref 38–126)
Anion gap: 12 (ref 5–15)
BUN: 6 mg/dL (ref 6–20)
CO2: 21 mmol/L — ABNORMAL LOW (ref 22–32)
Calcium: 9 mg/dL (ref 8.9–10.3)
Chloride: 105 mmol/L (ref 98–111)
Creatinine, Ser: 0.74 mg/dL (ref 0.44–1.00)
GFR, Estimated: >60 mL/min (ref >60)
Glucose, Bld: 129 mg/dL — ABNORMAL HIGH (ref 70–99)
Potassium: 3.2 mmol/L — ABNORMAL LOW (ref 3.5–5.1)
Sodium: 138 mmol/L (ref 135–145)
Total Bilirubin: 0.8 mg/dL (ref 0.0–1.2)
Total Protein: 7.9 g/dL (ref 6.5–8.1)

## 2023-12-09 LAB — LIPASE, BLOOD: Lipase: 126 U/L — ABNORMAL HIGH (ref 11–51)

## 2023-12-09 LAB — CBC
HCT: 39.9 % (ref 36.0–46.0)
Hemoglobin: 12.8 g/dL (ref 12.0–15.0)
MCH: 29.9 pg (ref 26.0–34.0)
MCHC: 32.1 g/dL (ref 30.0–36.0)
MCV: 93.2 fL (ref 80.0–100.0)
Platelets: 364 10*3/uL (ref 150–400)
RBC: 4.28 MIL/uL (ref 3.87–5.11)
RDW: 14 % (ref 11.5–15.5)
WBC: 6.2 10*3/uL (ref 4.0–10.5)
nRBC: 0 % (ref 0.0–0.2)

## 2023-12-09 LAB — ETHANOL: Alcohol, Ethyl (B): <15 mg/dL (ref <15)

## 2023-12-09 LAB — PROTIME-INR
INR: 1.5 — ABNORMAL HIGH (ref 0.8–1.2)
Prothrombin Time: 17.9 s — ABNORMAL HIGH (ref 11.4–15.2)

## 2023-12-09 LAB — ACETAMINOPHEN LEVEL: Acetaminophen (Tylenol), Serum: 24 ug/mL (ref 10–30)

## 2023-12-09 LAB — MAGNESIUM: Magnesium: 1.6 mg/dL — ABNORMAL LOW (ref 1.7–2.4)

## 2023-12-09 LAB — TROPONIN I (HIGH SENSITIVITY): Troponin I (High Sensitivity): 7 ng/L (ref <18)

## 2023-12-09 MED ORDER — DROPERIDOL 2.5 MG/ML IJ SOLN
1.2500 mg | Freq: Once | INTRAMUSCULAR | Status: AC
  Administered 2023-12-09: 1.25 mg via INTRAVENOUS
  Filled 2023-12-09: qty 2

## 2023-12-09 MED ORDER — ONDANSETRON HCL 4 MG/2ML IJ SOLN
4.0000 mg | Freq: Once | INTRAMUSCULAR | Status: AC
  Administered 2023-12-09: 4 mg via INTRAVENOUS
  Filled 2023-12-09: qty 2

## 2023-12-09 MED ORDER — POTASSIUM CHLORIDE CRYS ER 20 MEQ PO TBCR
40.0000 meq | EXTENDED_RELEASE_TABLET | Freq: Once | ORAL | Status: DC
Start: 2023-12-09 — End: 2023-12-11

## 2023-12-09 MED ORDER — OXYCODONE HCL 5 MG PO TABS
5.0000 mg | ORAL_TABLET | ORAL | Status: DC | PRN
  Administered 2023-12-09 – 2023-12-10 (×3): 10 mg via ORAL
  Filled 2023-12-09 (×3): qty 2

## 2023-12-09 MED ORDER — LACTATED RINGERS IV BOLUS
1000.0000 mL | Freq: Once | INTRAVENOUS | Status: AC
  Administered 2023-12-09: 1000 mL via INTRAVENOUS

## 2023-12-09 MED ORDER — ONDANSETRON HCL 4 MG/2ML IJ SOLN
4.0000 mg | Freq: Four times a day (QID) | INTRAMUSCULAR | Status: DC | PRN
Start: 2023-12-09 — End: 2023-12-14
  Administered 2023-12-11 – 2023-12-14 (×7): 4 mg via INTRAVENOUS
  Filled 2023-12-09 (×7): qty 2

## 2023-12-09 MED ORDER — AMLODIPINE BESYLATE 10 MG PO TABS
10.0000 mg | ORAL_TABLET | Freq: Every day | ORAL | Status: DC
Start: 2023-12-09 — End: 2023-12-14
  Administered 2023-12-10 – 2023-12-14 (×5): 10 mg via ORAL
  Filled 2023-12-09 (×5): qty 1

## 2023-12-09 MED ORDER — TRAZODONE HCL 50 MG PO TABS
100.0000 mg | ORAL_TABLET | Freq: Every day | ORAL | Status: DC
  Administered 2023-12-09 – 2023-12-13 (×5): 100 mg via ORAL
  Filled 2023-12-09 (×5): qty 2

## 2023-12-09 MED ORDER — SODIUM CHLORIDE 0.9% FLUSH
3.0000 mL | Freq: Two times a day (BID) | INTRAVENOUS | Status: DC
  Administered 2023-12-10 – 2023-12-14 (×8): 3 mL via INTRAVENOUS

## 2023-12-09 MED ORDER — PANTOPRAZOLE SODIUM 40 MG PO TBEC
40.0000 mg | DELAYED_RELEASE_TABLET | Freq: Every day | ORAL | Status: DC
  Administered 2023-12-10 – 2023-12-14 (×5): 40 mg via ORAL
  Filled 2023-12-09 (×5): qty 1

## 2023-12-09 MED ORDER — PANCRELIPASE (LIP-PROT-AMYL) 12000-38000 UNITS PO CPEP
36000.0000 [IU] | ORAL_CAPSULE | Freq: Three times a day (TID) | ORAL | Status: DC
Start: 2023-12-10 — End: 2023-12-14
  Administered 2023-12-10 – 2023-12-14 (×14): 36000 [IU] via ORAL
  Filled 2023-12-09 (×14): qty 3

## 2023-12-09 MED ORDER — ONDANSETRON HCL 4 MG PO TABS
4.0000 mg | ORAL_TABLET | Freq: Four times a day (QID) | ORAL | Status: DC | PRN
Start: 2023-12-09 — End: 2023-12-14
  Administered 2023-12-09 – 2023-12-11 (×2): 4 mg via ORAL
  Filled 2023-12-09 (×2): qty 1

## 2023-12-09 MED ORDER — ACETAMINOPHEN 325 MG PO TABS
975.0000 mg | ORAL_TABLET | Freq: Three times a day (TID) | ORAL | Status: DC
  Administered 2023-12-09 – 2023-12-11 (×6): 975 mg via ORAL
  Filled 2023-12-09 (×6): qty 3

## 2023-12-09 MED ORDER — HYDROMORPHONE HCL 1 MG/ML IJ SOLN
1.0000 mg | Freq: Once | INTRAMUSCULAR | Status: AC
  Administered 2023-12-09: 1 mg via INTRAVENOUS
  Filled 2023-12-09: qty 1

## 2023-12-09 MED ORDER — HYDROMORPHONE HCL 1 MG/ML IJ SOLN
1.0000 mg | INTRAMUSCULAR | Status: DC | PRN
  Administered 2023-12-09 – 2023-12-10 (×4): 1 mg via INTRAVENOUS
  Filled 2023-12-09 (×4): qty 1

## 2023-12-09 MED ORDER — LACTATED RINGERS IV SOLN: INTRAVENOUS | Status: DC

## 2023-12-09 MED ORDER — INSULIN ASPART 100 UNIT/ML IJ SOLN
0.0000 [IU] | Freq: Three times a day (TID) | INTRAMUSCULAR | Status: DC
  Administered 2023-12-10: 2 [IU] via SUBCUTANEOUS
  Administered 2023-12-14: 3 [IU] via SUBCUTANEOUS
  Filled 2023-12-09 (×2): qty 1

## 2023-12-09 MED ORDER — POLYETHYLENE GLYCOL 3350 17 G PO PACK: 17.0000 g | PACK | Freq: Every day | ORAL | Status: DC | PRN

## 2023-12-09 MED ORDER — IOHEXOL 300 MG/ML  SOLN
100.0000 mL | Freq: Once | INTRAMUSCULAR | Status: AC | PRN
  Administered 2023-12-09: 100 mL via INTRAVENOUS

## 2023-12-09 NOTE — Assessment & Plan Note (Signed)
 Patient has amlodipine , spironolactone  and losartan  on her medication list, however no recent refills.  - Restart amlodipine  now - Gradually introduce losartan  and spironolactone 

## 2023-12-09 NOTE — ED Notes (Signed)
 Called CCMD to add to monitoring

## 2023-12-09 NOTE — Assessment & Plan Note (Signed)
-  SSI, moderate

## 2023-12-09 NOTE — Assessment & Plan Note (Signed)
-   Warfarin per pharmacy dosing 

## 2023-12-09 NOTE — ED Notes (Signed)
 Pt states "I am a hard stick and they have to use the ultrasound to get my blood and IV".

## 2023-12-09 NOTE — Assessment & Plan Note (Signed)
 Patient has a long history of chronic pancreatitis with extremely frequent exacerbations when looking at admission and ED visit rate.  Lipase today is elevated compared to prior in addition to acute findings seen on CT imaging.  She is still able to tolerate p.o. intake other than solids.  - Telemetry monitoring - Start IV fluids - Pain control with Tylenol , oxycodone , Dilaudid 

## 2023-12-09 NOTE — Assessment & Plan Note (Addendum)
 Per chart review, patient has a history of long-term opioid use in the setting of pancreatitis and chronic pain, however, there is documented development of opioid use disorder.  She was previously on Suboxone  + methadone , then just Suboxone .  Although she does have reason for pain at this time, she has previously displayed manipulative behavior in order to receive higher doses of Dilaudid , in addition to malingering.   - Hold off on restarting Suboxone  at this time given patient preference

## 2023-12-09 NOTE — ED Provider Notes (Signed)
 [facility] Provider Note    Event Date/Time   First MD Initiated Contact with Patient 12/09/23 1554     (approximate)   History   Abdominal Pain (Chronic )   HPI  Sarah Sosa is a 43 y.o. female with history of chronic pancreatitis, chronic pain syndrome, aortic stenosis status post AVR on Coumadin , diabetes, heart failure, diabetes, presenting with abdominal pain.  States that has been ongoing since Thursday, epigastric, associated with nausea or vomiting.  States that she was admitted to [facility] in mid May because she had been taking too much Tylenol .  States that she takes no more than 3 g a day.  States that she cannot take NSAIDs because she is on Coumadin .  She denies any fever, urinary symptoms, chest pain or shortness of breath.  Does state that she has some diarrhea.  States that her gastroenterologist is retiring and she does not have any follow-up.  Patient states that she had completed her Lovenox  bridge and has been taking her Coumadin , states that she had not follow-up to recheck her INR.  States her INR levels are supposed to be 1.5-2.   On independent review, she was admitted to [facility] in May, she was found to have elevated liver enzymes thought secondary to Tylenol  overdose, had echo done there that showed 55 to 60% EF.  Physical Exam   Triage Vital Signs: ED Triage Vitals  Encounter Vitals Group     BP 12/09/23 1548 (!) 162/114     Systolic BP Percentile --      Diastolic BP Percentile --      Pulse Rate 12/09/23 1548 82     Resp 12/09/23 1548 18     Temp 12/09/23 1548 98.4 F (36.9 C)     Temp Source 12/09/23 1548 Oral     SpO2 12/09/23 1548 98 %     Weight --      Height 12/09/23 1549 5\' 6"  (1.676 m)     Head Circumference --      Peak Flow --      Pain Score 12/09/23 1549 10     Pain Loc --      Pain Education --      Exclude from Growth Chart --     Most recent vital signs: Vitals:   12/09/23 1630 12/09/23 1700  BP:  (!) 170/99 (!) 176/79  Pulse: 76 71  Resp: 12 14  Temp:    SpO2: 96% 98%     General: Awake, no distress.  CV:  Good peripheral perfusion.  Resp:  Normal effort.  Abd:  No distention.  Soft, mildly tender to the epigastric and suprapubic region without guarding Other:  Nontoxic-appearing   ED Results / Procedures / Treatments   Labs (all labs ordered are listed, but only abnormal results are displayed) Labs Reviewed  LIPASE, BLOOD - Abnormal; Notable for the following components:      Result Value   Lipase 126 (*)    All other components within normal limits  COMPREHENSIVE METABOLIC PANEL WITH GFR - Abnormal; Notable for the following components:   Potassium 3.2 (*)    CO2 21 (*)    Glucose, Bld 129 (*)    AST 48 (*)    ALT 47 (*)    All other components within normal limits  PROTIME-INR - Abnormal; Notable for the following components:   Prothrombin Time 17.9 (*)    INR 1.5 (*)    All other  components within normal limits  CBC  ETHANOL  ACETAMINOPHEN  LEVEL  URINALYSIS, W/ REFLEX TO CULTURE (INFECTION SUSPECTED)  TROPONIN I (HIGH SENSITIVITY)  TROPONIN I (HIGH SENSITIVITY)     EKG  EKG shows, EKG shows sinus rhythm, rate of 74, normal QRS, normal QTc, no ischemic ST elevation, T wave flattening in 3, aVF, not significant change compared to prior   RADIOLOGY On my independent interpretation, CT without obvious free air   PROCEDURES:  Critical Care performed: No  Procedures   MEDICATIONS ORDERED IN ED: Medications  droperidol  (INAPSINE ) 2.5 MG/ML injection 1.25 mg (1.25 mg Intravenous Given 12/09/23 1636)  iohexol  (OMNIPAQUE ) 300 MG/ML solution 100 mL (100 mLs Intravenous Contrast Given 12/09/23 1713)  HYDROmorphone  (DILAUDID ) injection 1 mg (1 mg Intravenous Given 12/09/23 1738)  ondansetron  (ZOFRAN ) injection 4 mg (4 mg Intravenous Given 12/09/23 1738)  lactated ringers  bolus 1,000 mL (1,000 mLs Intravenous New Bag/Given 12/09/23 1744)     IMPRESSION /  MDM / ASSESSMENT AND PLAN / ED COURSE  I reviewed the triage vital signs and the nursing notes.                              Differential diagnosis includes, but is not limited to, exacerbation of chronic pain syndrome, pancreatitis, GERD, gastritis, atypical ACS, UTI, gastroenteritis, colitis, viral infection, liver dysfunction.  Get labs, EKG, troponin, CT abdomen pelvis.  Discussed with patient about eventually following up outpatient with pain management for her chronic pain syndrome, also discussed trialing not opiates here.  On independent review, she has received droperidol  in the past with good effect.  Will give her a dose here.  Patient's presentation is most consistent with acute presentation with potential threat to life or bodily function.  Independent interpretation of labs and imaging below.  Clinical course as below.  Patient states her pain is improving slightly with the Dilaudid , she does not have any right lower quadrant abdominal pain, no guarding.  Given her pancreatitis, will plan to have her admitted for further management.  Consult to hospitalist was agreeable with the plan for admission and will evaluate the patient.  She is admitted.  The patient is on the cardiac monitor to evaluate for evidence of arrhythmia and/or significant heart rate changes.   Clinical Course as of 12/09/23 1805  Sun Dec 09, 2023  1753 CT ABDOMEN PELVIS W CONTRAST IMPRESSION: 1. Acute pancreatitis. No findings of pancreatic abscess, mass, or pseudocyst. 2. Submucosal fat deposition in the wall of the ascending, transverse, and descending colon and also in the appendix. This can have a weak association with inflammatory bowel disease. The appendix is also stably thickened at 0.9 cm in diameter, without surrounding periappendiceal inflammatory findings. I suspect that this thickening is probably due to the submucosal fat deposition, with chronic appendicitis as a less likely differential  diagnostic consideration. 3. Small supraumbilical hernia contains adipose tissue. 4. There is some swirling of mesenteric vessels in the right pelvis, but no bowel dilatation or bowel complication is observed. 5. Degenerative facet arthropathy in the lower lumbar spine. 6.  Aortic Atherosclerosis (ICD10-I70.0).   [TT]  1754 Independent review of labs, low leukocytosis, Tylenol  levels are not elevated, ethanol levels not elevated, troponin is not elevated, lipase is mildly elevated, electrolytes not severely deranged, LFTs are mildly elevated but this is consistent compared to prior. [TT]  1754 Given the CT abdomen findings of acute pancreatitis, she will need to be admitted for  pain management and antiemetics as well as hydration.  CT also showed submucosal fat deposition in the ascending transverse and descending colon and also in the appendix, radiologist suspects this might be due to submucosal fat deposition, as opposed to chronic appendicitis, she does not have any right lower quadrant pain at this time, suspect her symptoms are more likely due to her acute pancreatitis. [TT]  1758 INR(!): 1.5 At therapeutic level [TT]    Clinical Course User Index [TT] Drenda Gentle Richard Champion, MD     FINAL CLINICAL IMPRESSION(S) / ED DIAGNOSES   Final diagnoses:  Acute pancreatitis, unspecified complication status, unspecified pancreatitis type  Epigastric pain     Rx / DC Orders   ED Discharge Orders     None        Note:  This document was prepared using Dragon voice recognition software and may include unintentional dictation errors.    Shane Darling, MD 12/09/23 [phone]

## 2023-12-09 NOTE — Assessment & Plan Note (Signed)
 Patient appears euvolemic at this time.  - Hold home Lasix  given need for IV fluids - Daily weights

## 2023-12-09 NOTE — Progress Notes (Signed)
 PHARMACY - ANTICOAGULATION CONSULT NOTE  Pharmacy Consult for warfarin Indication: Aortic valve replacement with On-X valve  Allergies  Allergen Reactions   Latex Anaphylaxis, Hives and Itching    Other reaction(s): Other (See Comments) SKIN BURNING & PEELING    Nsaids Other (See Comments)    Serve ulcers; stomach aches, GERD.  Other Reaction(s): Other (See Comments)   Tomato Anaphylaxis, Hives and Itching   Gabapentin  Palpitations    Has heart condition; also makes jittery   Metformin      Other Reaction(s): Abdominal Pain  Causes pancreatitis to worsen   Lisinopril      Other Reaction(s): Angioedema   Tramadol Itching and Hives   Duloxetine Anxiety    Makes her jittery    Patient Measurements: Height: 5\' 6"  (167.6 cm) IBW/kg (Calculated) : 59.3  Vital Signs: Temp: 98.6 F (37 C) (05/25 1937) Temp Source: Oral (05/25 1548) BP: 146/84 (05/25 1937) Pulse Rate: 63 (05/25 1937)  Labs: Recent Labs    12/09/23 1614  HGB 12.8  HCT 39.9  PLT 364  LABPROT 17.9*  INR 1.5*  CREATININE 0.74  TROPONINIHS 7   Estimated Creatinine Clearance: 117 mL/min (by C-G formula based on SCr of 0.74 mg/dL).  Medical History: Past Medical History:  Diagnosis Date   Arthritis    Asthma    Chronic pain disorder 03/19/2018   GERD (gastroesophageal reflux disease)    Headache    Heart murmur    History of trichomoniasis 03/19/2018   Hypertension    Intractable nausea and vomiting    Pancreatitis    PID (pelvic inflammatory disease) 02/14/2018   Uterine leiomyoma 03/19/2018   Assessment: 43 y/o female presenting with abdominal pain, nausea, and vomiting. PMH significant for asthma, chronic pancreatitis, chronic pain syndrome, AVR on chronic anticoagulation (warfarin), T2DM, HTN, and HFpEF. Pharmacy has been consulted to resume and manage home warfarin.  Of note, patient last seen in anticoagulation clinic at [facility] on 10/19/23, where INR was found to be 5.1 (above goal of  1.5-2.0). At that time, provider adjusted warfarin regimen to 3.75 mg daily (TWD 26.25 mg). Per chart review, previous warfarin regimen prior to office visit on 4/4 was 7.5 mg on Thursdays and 3.75 mg all other days (TWD 30 mg).  INR on admission: 1.5 Last dose warfarin: 3.75 mg on 5/25 @ 1400 Baseline labs: hgb 12.8, plt 364  Goal of Therapy:  INR 1.5 - 2.0, confirmed with anticoagulation clinic notes from [facility] Monitor platelets by anticoagulation protocol: Yes   Plan:  INR therapeutic but at low end of goal at 1.5; CBC WNL Per patient, last dose of warfarin taken a few hours prior to admission, so will not give warfarin dose tonight Monitor daily INR while admitted Monitor CBC and signs/symptoms of bleeding  Thank you for involving pharmacy in this patient's care.   Ananias Balls, PharmD Clinical Pharmacist 12/09/2023 8:42 PM

## 2023-12-09 NOTE — H&P (Signed)
 History and Physical    Patient: Sarah Sosa JXB:[id number] DOB: June 29, 1981 DOA: 12/09/2023 DOS: the patient was seen and examined on 12/09/2023 PCP: Clinic, [facility]  Patient coming from: Home  Chief Complaint:  Chief Complaint  Patient presents with   Abdominal Pain    Chronic    HPI: Emili Mcloughlin is a 43 y.o. female with medical history significant of Chronic pancreatitis, opioid and alcohol use disorder, aortic insufficiency and ascending aortic aneurysm s/p aortic valve replacement and aneurysm repair on warfarin (September 2023), hypertension, type 2 diabetes, who presents to the ED due to abdominal pain.  Ms. Kilburg states that approximately 3 days ago she began to experience acute on chronic epigastric abdominal pain that radiates to her back.  She endorses nausea with vomiting only after eating solid food but has been able to tolerate all liquid intake.  She denies any diarrhea, chest pain, shortness of breath.  She denies any lower extremity swelling.  Ms. Perdomo states that she drink alcohol, approximately 2 glasses of wine, after her abdominal pain had already begun.  She notes that she uses alcohol still due to stress and believing it may help with her anticoagulant.  She notes that she is no longer taking Suboxone  after recent admission for Tylenol  overdose, at which time she was told that if it does not help her pain, she should not be taking it.  ED course: On arrival to the ED, patient was hypertensive at 162/114 with heart rate of 82.  She was saturating at 98% on room air.  She was afebrile at 98.4.  Initial workup notable for unremarkable CBC, potassium 3.2, creatinine 0.74, lipase 126, AST 48, ALT 47, GFR above 60.  Troponin negative.  INR 1.5.  Alcohol level negative.  CT of the abdomen was obtained that demonstrated acute on chronic pancreatitis in addition to nonspecific submucosal fat deposition in the colon and appendix.  Patient started on IV fluids and IV  pain control.  TRH contacted for admission.   Review of Systems: As mentioned in the history of present illness. All other systems reviewed and are negative.  Past Medical History:  Diagnosis Date   Arthritis    Asthma    Chronic pain disorder 03/19/2018   GERD (gastroesophageal reflux disease)    Headache    Heart murmur    History of trichomoniasis 03/19/2018   Hypertension    Intractable nausea and vomiting    Pancreatitis    PID (pelvic inflammatory disease) 02/14/2018   Uterine leiomyoma 03/19/2018   Past Surgical History:  Procedure Laterality Date   ABDOMINAL HYSTERECTOMY     AORTIC VALVE REPLACEMENT  03/2022   CHOLECYSTECTOMY     DENTAL SURGERY     HYSTERECTOMY ABDOMINAL WITH SALPINGECTOMY Bilateral 04/15/2018   Procedure: HYSTERECTOMY ABDOMINAL WITH BILATERAL SALPINGECTOMY;  Surgeon: Teresa Fender, MD;  Location: [facility] ORS;  Service: Gynecology;  Laterality: Bilateral;   kenn surgery Bilateral    KNEE ARTHROSCOPY Left 2012, 2013   Social History:  reports that she has been smoking cigarettes. She has a 11 pack-year smoking history. She has never used smokeless tobacco. She reports that she does not currently use alcohol. She reports current drug use. Drug: Marijuana.  Allergies  Allergen Reactions   Latex Anaphylaxis, Hives and Itching    Other reaction(s): Other (See Comments) SKIN BURNING & PEELING    Nsaids Other (See Comments)    Serve ulcers; stomach aches, GERD.  Other Reaction(s): Other (See Comments)   Tomato Anaphylaxis,  Hives and Itching   Gabapentin  Palpitations    Has heart condition; also makes jittery   Metformin      Other Reaction(s): Abdominal Pain  Causes pancreatitis to worsen   Lisinopril      Other Reaction(s): Angioedema   Tramadol Itching and Hives   Duloxetine Anxiety    Makes her jittery    Family History  Problem Relation Age of Onset   Fibroids Mother    Hypertension Mother    Arthritis Mother     Prior to Admission  medications   Medication Sig Start Date End Date Taking? Authorizing Provider  albuterol  (VENTOLIN  HFA) 108 (90 Base) MCG/ACT inhaler Inhale 1-2 puffs into the lungs every 6 (six) hours as needed. 07/26/23   [provider]  amitriptyline  (ELAVIL ) 10 MG tablet Take 10 mg by mouth at bedtime as needed for sleep. 11/02/22   [provider]  amLODipine  (NORVASC ) 10 MG tablet Take 1 tablet (10 mg total) by mouth daily. 07/19/23   Aisha Hove, MD  atorvastatin  (LIPITOR) 40 MG tablet Take 40 mg by mouth daily. 10/17/22   [provider]  buprenorphine -naloxone  (SUBOXONE ) 8-2 mg SUBL SL tablet Place 3 tablets under the tongue daily. 11/10/23   Lorita Rosa, MD  furosemide  (LASIX ) 40 MG tablet Take 0.5 tablets (20 mg total) by mouth daily as needed. Home med. 10/26/23   Garrison Kanner, MD  lipase/protease/amylase (CREON ) 36000 UNITS CPEP capsule Take 1 capsule (36,000 Units total) by mouth 3 (three) times daily with meals. 08/13/23   Tiajuana Fluke, MD  losartan  (COZAAR ) 50 MG tablet Take 1 tablet (50 mg total) by mouth daily. 07/19/23   Aisha Hove, MD  metFORMIN  (GLUCOPHAGE ) 500 MG tablet Take 500 mg by mouth every morning. 10/17/22   [provider]  naloxone  (NARCAN ) nasal spray 4 mg/0.1 mL Place 1 spray into the nose once as needed (to reverse overdose). 02/24/23   [provider]  omeprazole (PRILOSEC) 40 MG capsule Take 40 mg by mouth daily. 02/23/23   [provider]  ondansetron  (ZOFRAN ) 4 MG tablet Take 1 tablet (4 mg total) by mouth daily as needed for nausea or vomiting. 08/13/23 08/12/24  Tiajuana Fluke, MD  Semaglutide, 1 MG/DOSE, 4 MG/3ML SOPN Inject 0.75 mLs into the skin once a week. 10/19/23   [provider]  spironolactone  (ALDACTONE ) 25 MG tablet Take 25 mg by mouth daily. 02/03/22   [provider]  traZODone  (DESYREL ) 50 MG tablet Take 100 mg by mouth at bedtime. 09/07/21   [provider]  warfarin  (COUMADIN ) 7.5 MG tablet Take 3.25-7.5 mg by mouth daily. Take 3.75mg   M/T/W/F/S/Sun and Th 7.5mg  (1 tab)    [provider]    Physical Exam: Vitals:   12/09/23 1615 12/09/23 1630 12/09/23 1700 12/09/23 1937  BP: (!) 162/100 (!) 170/99 (!) 176/79 (!) 146/84  Pulse: 68 76 71 63  Resp: 12 12 14 18   Temp:    98.6 F (37 C)  TempSrc:      SpO2: 99% 96% 98% 99%  Height:       Physical Exam Vitals and nursing note reviewed.  Constitutional:      General: She is not in acute distress.    Appearance: She is obese. She is not toxic-appearing.  HENT:     Head: Normocephalic and atraumatic.     Mouth/Throat:     Mouth: Mucous membranes are moist.     Pharynx: Oropharynx is clear.  Eyes:  Conjunctiva/sclera: Conjunctivae normal.     Pupils: Pupils are equal, round, and reactive to light.  Cardiovascular:     Rate and Rhythm: Normal rate and regular rhythm.     Heart sounds: Murmur heard.  Pulmonary:     Effort: Pulmonary effort is normal.     Breath sounds: Normal breath sounds. No wheezing, rhonchi or rales.  Abdominal:     General: Bowel sounds are decreased.     Palpations: Abdomen is soft.     Tenderness: There is abdominal tenderness (Mild) in the epigastric area. There is no guarding.     Hernia: No hernia is present.  Musculoskeletal:     Right lower leg: No edema.     Left lower leg: No edema.  Skin:    General: Skin is warm and dry.  Neurological:     General: No focal deficit present.     Mental Status: She is alert and oriented to person, place, and time.  Psychiatric:        Mood and Affect: Mood normal.        Behavior: Behavior normal.    Data Reviewed: CBC with WBC of 6.2, hemoglobin of 12.8, platelets of 364 CMP with sodium of 138, potassium 3.2, bicarb 21, glucose 129, BUN 6, creatinine 0.74, AST 48, ALT 47, GFR above 60 Lipase 126 Troponin 7 INR 1.5 Tylenol  level 24 Alcohol level negative  EKG personally reviewed.  Sinus rhythm with rate  of 74.  No acute ischemic changes.  CT ABDOMEN PELVIS W CONTRAST Result Date: 12/09/2023 CLINICAL DATA:  Epigastric and suprapubic abdominal pain EXAM: CT ABDOMEN AND PELVIS WITH CONTRAST TECHNIQUE: Multidetector CT imaging of the abdomen and pelvis was performed using the standard protocol following bolus administration of intravenous contrast. RADIATION DOSE REDUCTION: This exam was performed according to the departmental dose-optimization program which includes automated exposure control, adjustment of the mA and/or kV according to patient size and/or use of iterative reconstruction technique. CONTRAST:  OMNIPAQUE  IOHEXOL  300 MG/ML  SOLN COMPARISON:  10/14/2023 FINDINGS: Lower chest: Aortic valve prosthesis visible on the scout image. Mild descending thoracic aortic atheromatous vascular calcification. Mild dependent atelectasis in the left lower lobe. Hepatobiliary: Cholecystectomy.  Otherwise unremarkable. Pancreas: Abnormal peripancreatic stranding compatible with acute pancreatitis. No findings of pancreatic abscess, mass, or pseudocyst. Spleen: Unremarkable Adrenals/Urinary Tract: Mild retained fetal lobulation of the kidneys. Adrenal glands normal. Urinary bladder unremarkable. Stomach/Bowel: Submucosal fat deposition in the wall of the ascending, transverse, and descending colon and also in the appendix. This can have a weak association with inflammatory bowel disease. Although the appendix is stable from 10/14/2023 and this appearance, the appendix is also stably thickened at 0.9 cm in diameter, without surrounding periappendiceal inflammatory findings. Vascular/Lymphatic: There continues to be swirling of the mesentery in the right pelvis as on image 51 series 2, without dilated bowel or specific complicating feature observed. Patent splenic vein. Atherosclerosis is present, including aortoiliac atherosclerotic disease. Reproductive: Uterus absent. Other: No supplemental non-categorized  findings. Musculoskeletal: Degenerative facet arthropathy in the lower lumbar spine. Small supraumbilical hernia contains adipose tissue on image 77 series 6. IMPRESSION: 1. Acute pancreatitis. No findings of pancreatic abscess, mass, or pseudocyst. 2. Submucosal fat deposition in the wall of the ascending, transverse, and descending colon and also in the appendix. This can have a weak association with inflammatory bowel disease. The appendix is also stably thickened at 0.9 cm in diameter, without surrounding periappendiceal inflammatory findings. I suspect that this thickening is probably due to  the submucosal fat deposition, with chronic appendicitis as a less likely differential diagnostic consideration. 3. Small supraumbilical hernia contains adipose tissue. 4. There is some swirling of mesenteric vessels in the right pelvis, but no bowel dilatation or bowel complication is observed. 5. Degenerative facet arthropathy in the lower lumbar spine. 6.  Aortic Atherosclerosis (ICD10-I70.0). Electronically Signed   By: Freida Jes M.D.   On: 12/09/2023 17:44   There are no new results to review at this time.  Assessment and Plan:  * Acute on chronic pancreatitis [facility]) Patient has a long history of chronic pancreatitis with extremely frequent exacerbations when looking at admission and ED visit rate.  Lipase today is elevated compared to prior in addition to acute findings seen on CT imaging.  She is still able to tolerate p.o. intake other than solids.  - Telemetry monitoring - Start IV fluids - Pain control with Tylenol , oxycodone , Dilaudid   Opioid use disorder Per chart review, patient has a history of long-term opioid use in the setting of pancreatitis and chronic pain, however, there is documented development of opioid use disorder.  She was previously on Suboxone  + methadone , then just Suboxone .  Although she does have reason for pain at this time, she has previously displayed manipulative  behavior in order to receive higher doses of Dilaudid , in addition to malingering.   - Hold off on restarting Suboxone  at this time given patient preference  Alcohol abuse Patient states that she continues to drink 1 to 2 glasses of wine daily.  She understands that this can become life-threatening given her comorbidities.  - CIWA monitoring without Ativan  - Folic acid , thiamine  and multivitamin daily  S/p aortic valve replacement with 'On-X" valve (INR target of 1.5 to 2) and ascending aortic graft ([facility], 03/2022) - Warfarin per pharmacy dosing  Essential hypertension Patient has amlodipine , spironolactone  and losartan  on her medication list, however no recent refills.  - Restart amlodipine  now - Gradually introduce losartan  and spironolactone   (HFpEF) heart failure with preserved ejection fraction (HCC) Patient appears euvolemic at this time.  - Hold home Lasix  given need for IV fluids - Daily weights  DM2 (diabetes mellitus, type 2) (HCC) - SSI, moderate  Advance Care Planning:   Code Status: Full Code   Consults: None  Family Communication: None  Severity of Illness: The appropriate patient status for this patient is OBSERVATION. Observation status is judged to be reasonable and necessary in order to provide the required intensity of service to ensure the patient's safety. The patient's presenting symptoms, physical exam findings, and initial radiographic and laboratory data in the context of their medical condition is felt to place them at decreased risk for further clinical deterioration. Furthermore, it is anticipated that the patient will be medically stable for discharge from the hospital within 2 midnights of admission.   Author: Avi Body, MD 12/09/2023 7:40 PM  For on call review [email or web address].

## 2023-12-09 NOTE — Assessment & Plan Note (Signed)
 Patient states that she continues to drink 1 to 2 glasses of wine daily.  She understands that this can become life-threatening given her comorbidities.  - CIWA monitoring without Ativan  - Folic acid , thiamine  and multivitamin daily

## 2023-12-09 NOTE — ED Triage Notes (Signed)
 Pt to ed from home via POV for abd pain. Pt has chronic pancreatitis and liver failure. Pt states "I dont have time to get in with [facility]". Pt was just seen on 5/22 for same. Pt is caox4, in no acute distress and ambulatory in triage.

## 2023-12-09 NOTE — ED Notes (Signed)
 Advised nurse that patient has ready bed

## 2023-12-10 DIAGNOSIS — Z6839 Body mass index (BMI) 39.0-39.9, adult: Secondary | ICD-10-CM | POA: Diagnosis not present

## 2023-12-10 DIAGNOSIS — R1013 Epigastric pain: Secondary | ICD-10-CM | POA: Diagnosis present

## 2023-12-10 DIAGNOSIS — Z8261 Family history of arthritis: Secondary | ICD-10-CM | POA: Diagnosis not present

## 2023-12-10 DIAGNOSIS — Z79899 Other long term (current) drug therapy: Secondary | ICD-10-CM | POA: Diagnosis not present

## 2023-12-10 DIAGNOSIS — F1010 Alcohol abuse, uncomplicated: Secondary | ICD-10-CM | POA: Diagnosis present

## 2023-12-10 DIAGNOSIS — K219 Gastro-esophageal reflux disease without esophagitis: Secondary | ICD-10-CM | POA: Diagnosis present

## 2023-12-10 DIAGNOSIS — Z7901 Long term (current) use of anticoagulants: Secondary | ICD-10-CM | POA: Diagnosis not present

## 2023-12-10 DIAGNOSIS — I5032 Chronic diastolic (congestive) heart failure: Secondary | ICD-10-CM | POA: Diagnosis present

## 2023-12-10 DIAGNOSIS — I351 Nonrheumatic aortic (valve) insufficiency: Secondary | ICD-10-CM | POA: Diagnosis present

## 2023-12-10 DIAGNOSIS — K859 Acute pancreatitis without necrosis or infection, unspecified: Secondary | ICD-10-CM | POA: Diagnosis present

## 2023-12-10 DIAGNOSIS — Z8249 Family history of ischemic heart disease and other diseases of the circulatory system: Secondary | ICD-10-CM | POA: Diagnosis not present

## 2023-12-10 DIAGNOSIS — Z7984 Long term (current) use of oral hypoglycemic drugs: Secondary | ICD-10-CM | POA: Diagnosis not present

## 2023-12-10 DIAGNOSIS — Z952 Presence of prosthetic heart valve: Secondary | ICD-10-CM | POA: Diagnosis not present

## 2023-12-10 DIAGNOSIS — E876 Hypokalemia: Secondary | ICD-10-CM | POA: Diagnosis present

## 2023-12-10 DIAGNOSIS — Z7985 Long-term (current) use of injectable non-insulin antidiabetic drugs: Secondary | ICD-10-CM | POA: Diagnosis not present

## 2023-12-10 DIAGNOSIS — F1110 Opioid abuse, uncomplicated: Secondary | ICD-10-CM | POA: Diagnosis present

## 2023-12-10 DIAGNOSIS — G894 Chronic pain syndrome: Secondary | ICD-10-CM | POA: Diagnosis present

## 2023-12-10 DIAGNOSIS — I110 Hypertensive heart disease with heart failure: Secondary | ICD-10-CM | POA: Diagnosis present

## 2023-12-10 DIAGNOSIS — E119 Type 2 diabetes mellitus without complications: Secondary | ICD-10-CM | POA: Diagnosis present

## 2023-12-10 DIAGNOSIS — Y900 Blood alcohol level of less than 20 mg/100 ml: Secondary | ICD-10-CM | POA: Diagnosis present

## 2023-12-10 DIAGNOSIS — K861 Other chronic pancreatitis: Secondary | ICD-10-CM | POA: Diagnosis present

## 2023-12-10 DIAGNOSIS — E66812 Obesity, class 2: Secondary | ICD-10-CM | POA: Diagnosis present

## 2023-12-10 DIAGNOSIS — J45909 Unspecified asthma, uncomplicated: Secondary | ICD-10-CM | POA: Diagnosis present

## 2023-12-10 DIAGNOSIS — F17210 Nicotine dependence, cigarettes, uncomplicated: Secondary | ICD-10-CM | POA: Diagnosis present

## 2023-12-10 DIAGNOSIS — Z91040 Latex allergy status: Secondary | ICD-10-CM | POA: Diagnosis not present

## 2023-12-10 DIAGNOSIS — Z886 Allergy status to analgesic agent status: Secondary | ICD-10-CM | POA: Diagnosis not present

## 2023-12-10 LAB — CBC
HCT: 35.5 % — ABNORMAL LOW (ref 36.0–46.0)
Hemoglobin: 11.7 g/dL — ABNORMAL LOW (ref 12.0–15.0)
MCH: 30 pg (ref 26.0–34.0)
MCHC: 33 g/dL (ref 30.0–36.0)
MCV: 91 fL (ref 80.0–100.0)
Platelets: 327 10*3/uL (ref 150–400)
RBC: 3.9 MIL/uL (ref 3.87–5.11)
RDW: 13.8 % (ref 11.5–15.5)
WBC: 9.1 10*3/uL (ref 4.0–10.5)
nRBC: 0 % (ref 0.0–0.2)

## 2023-12-10 LAB — COMPREHENSIVE METABOLIC PANEL WITH GFR
ALT: 34 U/L (ref 0–44)
AST: 31 U/L (ref 15–41)
Albumin: 3.5 g/dL (ref 3.5–5.0)
Alkaline Phosphatase: 61 U/L (ref 38–126)
Anion gap: 10 (ref 5–15)
BUN: 6 mg/dL (ref 6–20)
CO2: 21 mmol/L — ABNORMAL LOW (ref 22–32)
Calcium: 8.7 mg/dL — ABNORMAL LOW (ref 8.9–10.3)
Chloride: 103 mmol/L (ref 98–111)
Creatinine, Ser: 0.6 mg/dL (ref 0.44–1.00)
GFR, Estimated: >60 mL/min (ref >60)
Glucose, Bld: 130 mg/dL — ABNORMAL HIGH (ref 70–99)
Potassium: 3.4 mmol/L — ABNORMAL LOW (ref 3.5–5.1)
Sodium: 134 mmol/L — ABNORMAL LOW (ref 135–145)
Total Bilirubin: 0.7 mg/dL (ref 0.0–1.2)
Total Protein: 6.7 g/dL (ref 6.5–8.1)

## 2023-12-10 LAB — PHOSPHORUS: Phosphorus: 2.7 mg/dL (ref 2.5–4.6)

## 2023-12-10 LAB — MAGNESIUM: Magnesium: 1.3 mg/dL — ABNORMAL LOW (ref 1.7–2.4)

## 2023-12-10 LAB — PROTIME-INR
INR: 1.5 — ABNORMAL HIGH (ref 0.8–1.2)
Prothrombin Time: 18.6 s — ABNORMAL HIGH (ref 11.4–15.2)

## 2023-12-10 LAB — GLUCOSE, CAPILLARY
Glucose-Capillary: 103 mg/dL — ABNORMAL HIGH (ref 70–99)
Glucose-Capillary: 126 mg/dL — ABNORMAL HIGH (ref 70–99)
Glucose-Capillary: 117 mg/dL — ABNORMAL HIGH (ref 70–99)
Glucose-Capillary: 117 mg/dL — ABNORMAL HIGH (ref 70–99)

## 2023-12-10 MED ORDER — WARFARIN SODIUM 2.5 MG PO TABS
3.7500 mg | ORAL_TABLET | Freq: Once | ORAL | Status: AC
  Administered 2023-12-10: 3.75 mg via ORAL
  Filled 2023-12-10: qty 1

## 2023-12-10 MED ORDER — LOSARTAN POTASSIUM 50 MG PO TABS
50.0000 mg | ORAL_TABLET | Freq: Every day | ORAL | Status: DC
  Administered 2023-12-10 – 2023-12-14 (×5): 50 mg via ORAL
  Filled 2023-12-10 (×5): qty 1

## 2023-12-10 MED ORDER — SPIRONOLACTONE 25 MG PO TABS
25.0000 mg | ORAL_TABLET | Freq: Every day | ORAL | Status: DC
  Administered 2023-12-10 – 2023-12-14 (×5): 25 mg via ORAL
  Filled 2023-12-10 (×5): qty 1

## 2023-12-10 MED ORDER — HYDROMORPHONE HCL 1 MG/ML IJ SOLN
1.0000 mg | Freq: Four times a day (QID) | INTRAMUSCULAR | Status: DC | PRN
  Administered 2023-12-10 – 2023-12-11 (×4): 1 mg via INTRAVENOUS
  Filled 2023-12-10 (×4): qty 1

## 2023-12-10 MED ORDER — MAGNESIUM SULFATE 4 GM/100ML IV SOLN
4.0000 g | Freq: Once | INTRAVENOUS | Status: AC
  Administered 2023-12-10: 4 g via INTRAVENOUS
  Filled 2023-12-10: qty 100

## 2023-12-10 MED ORDER — OXYCODONE HCL 5 MG PO TABS: 5.0000 mg | ORAL_TABLET | ORAL | Status: DC | PRN

## 2023-12-10 MED ORDER — OXYCODONE HCL 5 MG PO TABS
10.0000 mg | ORAL_TABLET | ORAL | Status: DC | PRN
  Administered 2023-12-10 – 2023-12-14 (×19): 10 mg via ORAL
  Filled 2023-12-10 (×19): qty 2

## 2023-12-10 MED ORDER — WARFARIN - PHARMACIST DOSING INPATIENT: Freq: Every day | Status: DC

## 2023-12-10 MED ORDER — ATORVASTATIN CALCIUM 20 MG PO TABS
40.0000 mg | ORAL_TABLET | Freq: Every day | ORAL | Status: DC
  Administered 2023-12-10 – 2023-12-14 (×5): 40 mg via ORAL
  Filled 2023-12-10 (×5): qty 2

## 2023-12-10 NOTE — Consult Note (Addendum)
 PHARMACY CONSULT NOTE - FOLLOW UP  Pharmacy Consult for Electrolyte Monitoring and Replacement   Recent Labs: Potassium (mmol/L)  Date Value  12/10/2023 3.4 (L)   Magnesium  (mg/dL)  Date Value  16/04/9603 1.3 (L)   Calcium  (mg/dL)  Date Value  54/03/8118 8.7 (L)   Albumin (g/dL)  Date Value  14/78/2956 3.5   Phosphorus (mg/dL)  Date Value  21/30/8657 2.2 (L)   Sodium (mmol/L)  Date Value  12/10/2023 134 (L)   Assessment: SB is a 43 yo female who presented to the ED with abdominal pain, nausea and vomiting for the last 3 days. They have a history significant for chronic pancreatitis, opioid and alcohol use disorder. Pharmacy has been consulted to manage this patient's electrolytes while admitted.   Goal of Therapy:  Electrolytes WNL   Plan:  K = 3.4; Provider order 40 mEq of Kcl  Mg = 1.3, ordered 4g Mg Sulfate IV x 1  Phos = 2.7, no replacement indicated at this time  Continue to follow electrolytes with AM labs  Thank you for allowing pharmacy to participate in this patient's care.   Craven Do, PharmD Pharmacy Resident  12/10/2023 8:43 AM

## 2023-12-10 NOTE — Plan of Care (Signed)

## 2023-12-10 NOTE — Progress Notes (Signed)
 [facility]  - [place] [facility]   PATIENT NAME: Sarah Sosa    MR#:  [id number]  DATE OF BIRTH:  April 09, 1981  SUBJECTIVE:  no family at bedside no vomiting. Patient complains of pain. Takes her meds including IV per RN. No vomiting. Sitting up comfortably in bed. Agreeable to soft diet. Meds not want to try and sugar at present.    VITALS:  Blood pressure (!) 157/88, pulse 67, temperature 99 F (37.2 C), temperature source Oral, resp. rate 16, height 5\' 6"  (1.676 m), weight 116.4 kg, last menstrual period 03/25/2018, SpO2 98%.  PHYSICAL EXAMINATION:   GENERAL:  43 y.o.-year-old patient with no acute distress. Obese LUNGS: Normal breath sounds bilaterally, no wheezing CARDIOVASCULAR: S1, S2 normal. No murmur   ABDOMEN: Soft, generalized tender, nondistended.  EXTREMITIES: No  edema b/l.    NEUROLOGIC: nonfocal  patient is alert and awake, no tremors LABORATORY PANEL:  CBC Recent Labs  Lab 12/10/23 0449  WBC 9.1  HGB 11.7*  HCT 35.5*  PLT 327    Chemistries  Recent Labs  Lab 12/10/23 0449  NA 134*  K 3.4*  CL 103  CO2 21*  GLUCOSE 130*  BUN 6  CREATININE 0.60  CALCIUM  8.7*  MG 1.3*  AST 31  ALT 34  ALKPHOS 61  BILITOT 0.7   Cardiac Enzymes No results for input(s): "TROPONINI" in the last 168 hours. RADIOLOGY:  CT ABDOMEN PELVIS W CONTRAST Result Date: 12/09/2023 CLINICAL DATA:  Epigastric and suprapubic abdominal pain EXAM: CT ABDOMEN AND PELVIS WITH CONTRAST TECHNIQUE: Multidetector CT imaging of the abdomen and pelvis was performed using the standard protocol following bolus administration of intravenous contrast. RADIATION DOSE REDUCTION: This exam was performed according to the departmental dose-optimization program which includes automated exposure control, adjustment of the mA and/or kV according to patient size and/or use of iterative reconstruction technique. CONTRAST:  OMNIPAQUE  IOHEXOL  300 MG/ML  SOLN COMPARISON:   10/14/2023 FINDINGS: Lower chest: Aortic valve prosthesis visible on the scout image. Mild descending thoracic aortic atheromatous vascular calcification. Mild dependent atelectasis in the left lower lobe. Hepatobiliary: Cholecystectomy.  Otherwise unremarkable. Pancreas: Abnormal peripancreatic stranding compatible with acute pancreatitis. No findings of pancreatic abscess, mass, or pseudocyst. Spleen: Unremarkable Adrenals/Urinary Tract: Mild retained fetal lobulation of the kidneys. Adrenal glands normal. Urinary bladder unremarkable. Stomach/Bowel: Submucosal fat deposition in the wall of the ascending, transverse, and descending colon and also in the appendix. This can have a weak association with inflammatory bowel disease. Although the appendix is stable from 10/14/2023 and this appearance, the appendix is also stably thickened at 0.9 cm in diameter, without surrounding periappendiceal inflammatory findings. Vascular/Lymphatic: There continues to be swirling of the mesentery in the right pelvis as on image 51 series 2, without dilated bowel or specific complicating feature observed. Patent splenic vein. Atherosclerosis is present, including aortoiliac atherosclerotic disease. Reproductive: Uterus absent. Other: No supplemental non-categorized findings. Musculoskeletal: Degenerative facet arthropathy in the lower lumbar spine. Small supraumbilical hernia contains adipose tissue on image 77 series 6. IMPRESSION: 1. Acute pancreatitis. No findings of pancreatic abscess, mass, or pseudocyst. 2. Submucosal fat deposition in the wall of the ascending, transverse, and descending colon and also in the appendix. This can have a weak association with inflammatory bowel disease. The appendix is also stably thickened at 0.9 cm in diameter, without surrounding periappendiceal inflammatory findings. I suspect that this thickening is probably due to the submucosal fat deposition, with chronic appendicitis as a less likely  differential diagnostic  consideration. 3. Small supraumbilical hernia contains adipose tissue. 4. There is some swirling of mesenteric vessels in the right pelvis, but no bowel dilatation or bowel complication is observed. 5. Degenerative facet arthropathy in the lower lumbar spine. 6.  Aortic Atherosclerosis (ICD10-I70.0). Electronically Signed   By: Freida Jes M.D.   On: 12/09/2023 17:44    Assessment and Plan Sarah Sosa is a 43 y.o. female with medical history significant of Chronic pancreatitis, opioid and alcohol use disorder, aortic insufficiency and ascending aortic aneurysm s/p aortic valve replacement and aneurysm repair on warfarin (September 2023), hypertension, type 2 diabetes, who presents to the ED due to abdominal pain.   Pt has been seen at [place], [place] [facility] and [facility] --frequently. This pt pts 12th visit since Jan 2025  Acute on chronic pancreatitis [facility]) Patient has a long history of chronic pancreatitis with extremely frequent exacerbations when looking at admission and ED visit rate.  Lipase today is elevated compared to prior in addition to acute findings seen on CT imaging.  She is still able to tolerate p.o. intake other than solids. - received IV fluids - Pain control with Tylenol , oxycodone , Dilaudid  -- discussed with patient at length and I did send ambulatory referral for pain clinic at [facility]. Patient asked patient to follow-up with PCP and call do pain clinic if she does not hear from them. --advance to soft diet   Opioid use disorder --Per chart review, patient has a history of long-term opioid use in the setting of pancreatitis and chronic pain, however, there is documented development of opioid use disorder.  She was previously on Suboxone  + methadone .   Although she does have reason for pain at this time, she has previously displayed manipulative behavior in order to receive higher doses of Dilaudid , in addition to malingering ([facility] records)  Alcohol  abuse Patient states that she continues to drink 1 to 2 glasses of wine daily.  She understands that this can become life-threatening given her comorbidities.  - CIWA monitoring without Ativan  - Folic acid , thiamine  and multivitamin daily   S/p aortic valve replacement with 'On-X" valve (INR target of 1.5 to 2) and ascending aortic graft ([facility], 03/2022) - Warfarin per pharmacy dosing   Essential hypertension Patient has amlodipine , spironolactone  and losartan  on her medication list, however no recent refills.  - Restart amlodipine  and add losartan , spironolactone --since BP is high   (HFpEF) heart failure with preserved ejection fraction (HCC) Patient appears euvolemic at this time.   DM2 (diabetes mellitus, type 2) (HCC) - SSI, moderate   Advance Care Planning:   Code Status: Full Code   Consults: None Family communication :none today DVT Prophylaxis :warfarin Level of care: Telemetry Medical Status is: Inpatient Remains inpatient appropriate because: chronic pain, advancing diet    TOTAL TIME TAKING CARE OF THIS PATIENT: 35 minutes.  >50% time spent on counselling and coordination of care  Note: This dictation was prepared with Dragon dictation along with smaller phrase technology. Any transcriptional errors that result from this process are unintentional.  Melvinia Stager M.D    [facility]   CC: Primary care physician; Clinic[facility]

## 2023-12-10 NOTE — Progress Notes (Signed)
 Patient introduced to role of nurse navigator. Intake questions completed.   Patient currently lives with her two twin daughters (42 yrs old) and her father. She also has three other adult children (20 yrs, 23 yrs & 24 yrs).  She is NOT employed and was denied for SSDI. She states she will be reapplying again. Her father works and receives a retirement check. She DOES receive food stamps, but does state sometimes food runs out before the end of the month.  Patient states she has mobility limitations r/t to bilat knee pain and BLE swelling if she sits for "too long" or stands for "too long." Patient states her dad and her children are able to assist when needed. She also states has chronic pain, that is difficult to manage.  Patient DOES have a PCP she follows at [facility] She would be appreciate of a pain management referral to a [facility] specialist. MD made aware.  Patient states she feels like her increase in pancreatitis pain is related to her ozempic. Patient states she was 290 pounds in February and is now down to 250 pounds. Although she is happy with the weight loss and has seen some improvement in her knee pain, she states the "side effects" and "abdominal pain" might not be worth it. Encouraged to speak with her prescribing doctor about this.  Patient states she would like assistance with finding housing options. Currently she has not applied with the [employer] or the [employer]. Will provide applications.   Patient currently denies need for substance abuse tx, stating she only drinks "wine occasionally." Pt does endorse THC use.   Patient also denies need for mental health provider, despite endorses anxiety, especially when in populated/crowded areas.  Encouraged to reach out with questions or concerns.

## 2023-12-10 NOTE — Progress Notes (Addendum)
 PHARMACY - ANTICOAGULATION CONSULT NOTE  Pharmacy Consult for warfarin Indication: Aortic valve replacement with On-X valve  Allergies  Allergen Reactions   Latex Anaphylaxis, Hives and Itching    Other reaction(s): Other (See Comments) SKIN BURNING & PEELING    Nsaids Other (See Comments)    Serve ulcers; stomach aches, GERD.  Other Reaction(s): Other (See Comments)   Tomato Anaphylaxis, Hives and Itching   Gabapentin  Palpitations    Has heart condition; also makes jittery   Metformin      Other Reaction(s): Abdominal Pain  Causes pancreatitis to worsen   Lisinopril      Other Reaction(s): Angioedema   Tramadol Itching and Hives   Duloxetine Anxiety    Makes her jittery    Patient Measurements: Height: 5\' 6"  (167.6 cm) Weight: 116.4 kg (256 lb 9.9 oz) IBW/kg (Calculated) : 59.3 HEPARIN  DW (KG): 85.7  Vital Signs: Temp: 99 F (37.2 C) (05/26 0807) Temp Source: Oral (05/26 0807) BP: 157/88 (05/26 0807) Pulse Rate: 67 (05/26 0807)  Labs: Recent Labs    12/09/23 1614 12/10/23 0449  HGB 12.8 11.7*  HCT 39.9 35.5*  PLT 364 327  LABPROT 17.9* 18.6*  INR 1.5* 1.5*  CREATININE 0.74 0.60  TROPONINIHS 7  --    Estimated Creatinine Clearance: 118.7 mL/min (by C-G formula based on SCr of 0.6 mg/dL).  Medical History: Past Medical History:  Diagnosis Date   Arthritis    Asthma    Chronic pain disorder 03/19/2018   GERD (gastroesophageal reflux disease)    Headache    Heart murmur    History of trichomoniasis 03/19/2018   Hypertension    Intractable nausea and vomiting    Pancreatitis    PID (pelvic inflammatory disease) 02/14/2018   Uterine leiomyoma 03/19/2018   PTA Warfarin Regimen Last dose warfarin before admission: 3.75 mg on 5/25 @ 1400 Mon/Tue/Wed/Fri/Sat/Sun: 3.75 mg  Thus: 7.5 mg   Assessment: 43 y/o female presenting with abdominal pain, nausea, and vomiting. PMH significant for asthma, chronic pancreatitis, chronic pain syndrome, AVR on  chronic anticoagulation (warfarin), T2DM, HTN, and HFpEF. Pharmacy has been consulted to resume and manage home warfarin.  [facility] notes Of note, patient last seen in anticoagulation clinic at [facility] on 10/19/23, where INR was found to be 5.1 (above goal of 1.5-2.0). At that time, provider adjusted warfarin regimen to 3.75 mg daily (TWD 26.25 mg). Per chart review, previous warfarin regimen prior to office visit on 4/4 was 7.5 mg on Thursdays and 3.75 mg all other days (TWD 30 mg).  Baseline INR on admission: 1.5 Baseline labs: hgb 12.8, plt 364  Goal of Therapy:  INR 1.5 - 2.0, confirmed with anticoagulation clinic notes from [facility] Monitor platelets by anticoagulation protocol: Yes  INR  Date/Time  INR Assess  Warfarin Dose 12/09/23@1614  1.5 Just at goal Patient took dose before admission  12/10/23@0449  1.5 Just at goal  Give 3.75 mg warfarin    Plan:  -- INR therapeutic but at low end of goal at 1.5; CBC WNL -- Resume PTA warfarin; give 3.75 mg today @ 1600 -- Monitor daily INR while admitted -- Monitor CBC and signs/symptoms of bleeding  Thank you for involving pharmacy in this patient's care.   Craven Do, PharmD Pharmacy Resident  12/10/2023 9:11 AM

## 2023-12-10 NOTE — Plan of Care (Signed)
 Patient admitted to unit for reported abdominal pain/discomfort secondary to pancreatitis.  Patient arrives to floor via stretcher.  RN able to perform general assessment.  Skin remains intact upon inspection.  Lung fields remain clear throughout.  PIV to (L) AC remains free from any noted signs of complications/patent.  Flushes without difficulty.  LR initiated per MD order.  Patient noted to tolerate current IV therapy.  RN able to review with patient hospital admission information.  Patient has verbalized understanding of information reviewed.  Patient has received prn pain medication, as well as scheduled tylenol  medication for adequate pain management.  Patient noted to remain free from any noted signs of associated symptoms.  Telemetry monitoring initiated.  Patient remains free from any noted signs of acute cardiac complications.  Patient to continue to be monitored by hospital staff until discharge.

## 2023-12-11 DIAGNOSIS — K859 Acute pancreatitis without necrosis or infection, unspecified: Secondary | ICD-10-CM | POA: Diagnosis not present

## 2023-12-11 DIAGNOSIS — K861 Other chronic pancreatitis: Secondary | ICD-10-CM | POA: Diagnosis not present

## 2023-12-11 LAB — CBC
HCT: 37.9 % (ref 36.0–46.0)
Hemoglobin: 12.7 g/dL (ref 12.0–15.0)
MCH: 30.7 pg (ref 26.0–34.0)
MCHC: 33.5 g/dL (ref 30.0–36.0)
MCV: 91.5 fL (ref 80.0–100.0)
Platelets: 305 10*3/uL (ref 150–400)
RBC: 4.14 MIL/uL (ref 3.87–5.11)
RDW: 13.7 % (ref 11.5–15.5)
WBC: 6.3 10*3/uL (ref 4.0–10.5)
nRBC: 0 % (ref 0.0–0.2)

## 2023-12-11 LAB — PROTIME-INR
INR: 1.4 — ABNORMAL HIGH (ref 0.8–1.2)
Prothrombin Time: 17.4 s — ABNORMAL HIGH (ref 11.4–15.2)

## 2023-12-11 LAB — MAGNESIUM: Magnesium: 1.9 mg/dL (ref 1.7–2.4)

## 2023-12-11 LAB — GLUCOSE, CAPILLARY
Glucose-Capillary: 105 mg/dL — ABNORMAL HIGH (ref 70–99)
Glucose-Capillary: 114 mg/dL — ABNORMAL HIGH (ref 70–99)
Glucose-Capillary: 102 mg/dL — ABNORMAL HIGH (ref 70–99)
Glucose-Capillary: 106 mg/dL — ABNORMAL HIGH (ref 70–99)

## 2023-12-11 LAB — BASIC METABOLIC PANEL WITH GFR
Anion gap: 8 (ref 5–15)
BUN: 6 mg/dL (ref 6–20)
CO2: 24 mmol/L (ref 22–32)
Calcium: 8.9 mg/dL (ref 8.9–10.3)
Chloride: 106 mmol/L (ref 98–111)
Creatinine, Ser: 0.67 mg/dL (ref 0.44–1.00)
GFR, Estimated: >60 mL/min (ref >60)
Glucose, Bld: 109 mg/dL — ABNORMAL HIGH (ref 70–99)
Potassium: 3.4 mmol/L — ABNORMAL LOW (ref 3.5–5.1)
Sodium: 138 mmol/L (ref 135–145)

## 2023-12-11 LAB — LIPASE, BLOOD: Lipase: 133 U/L — ABNORMAL HIGH (ref 11–51)

## 2023-12-11 LAB — PHOSPHORUS: Phosphorus: 2.6 mg/dL (ref 2.5–4.6)

## 2023-12-11 MED ORDER — ACETAMINOPHEN 500 MG PO TABS
500.0000 mg | ORAL_TABLET | ORAL | Status: DC | PRN
  Administered 2023-12-12 (×3): 500 mg via ORAL
  Filled 2023-12-11 (×3): qty 1

## 2023-12-11 MED ORDER — WARFARIN SODIUM 2.5 MG PO TABS
3.7500 mg | ORAL_TABLET | Freq: Once | ORAL | Status: AC
  Administered 2023-12-11: 3.75 mg via ORAL
  Filled 2023-12-11: qty 1

## 2023-12-11 MED ORDER — HYDROMORPHONE HCL 1 MG/ML IJ SOLN
1.0000 mg | INTRAMUSCULAR | Status: AC | PRN
  Administered 2023-12-11 – 2023-12-12 (×6): 1 mg via INTRAVENOUS
  Filled 2023-12-11 (×6): qty 1

## 2023-12-11 MED ORDER — POTASSIUM CHLORIDE 20 MEQ PO PACK
40.0000 meq | PACK | Freq: Once | ORAL | Status: AC
  Administered 2023-12-11: 40 meq via ORAL
  Filled 2023-12-11: qty 2

## 2023-12-11 NOTE — Progress Notes (Signed)
 [facility]  - [place] [facility]   PATIENT NAME: Sarah Sosa    MR#:  [id number]  DATE OF BIRTH:  [birth year]  SUBJECTIVE:  no family at bedside  Patient complains of pain and looks rough! Lipase 133 Change to CLD today Pt asking for Dialudid every 4 hours    VITALS:  Blood pressure (!) 154/74, pulse 72, temperature 98.3 F (36.8 C), resp. rate 17, height 5\' 6"  (1.676 m), weight 116 kg, last menstrual period 03/25/2018, SpO2 92%.  PHYSICAL EXAMINATION:   GENERAL:  43 y.o.-year-old patient with no acute distress. Obese LUNGS: Normal breath sounds bilaterally, no wheezing CARDIOVASCULAR: S1, S2 normal. No murmur   ABDOMEN: Soft, generalized tender, nondistended.  EXTREMITIES: No  edema b/l.    NEUROLOGIC: nonfocal  patient is alert and awake, no tremors LABORATORY PANEL:  CBC Recent Labs  Lab 12/11/23 0535  WBC 6.3  HGB 12.7  HCT 37.9  PLT 305    Chemistries  Recent Labs  Lab 12/10/23 0449 12/11/23 0535  NA 134* 138  K 3.4* 3.4*  CL 103 106  CO2 21* 24  GLUCOSE 130* 109*  BUN 6 6  CREATININE 0.60 0.67  CALCIUM  8.7* 8.9  MG 1.3* 1.9  AST 31  --   ALT 34  --   ALKPHOS 61  --   BILITOT 0.7  --    Cardiac Enzymes No results for input(s): "TROPONINI" in the last 168 hours. RADIOLOGY:  CT ABDOMEN PELVIS W CONTRAST Result Date: 12/09/2023 CLINICAL DATA:  Epigastric and suprapubic abdominal pain EXAM: CT ABDOMEN AND PELVIS WITH CONTRAST TECHNIQUE: Multidetector CT imaging of the abdomen and pelvis was performed using the standard protocol following bolus administration of intravenous contrast. RADIATION DOSE REDUCTION: This exam was performed according to the departmental dose-optimization program which includes automated exposure control, adjustment of the mA and/or kV according to patient size and/or use of iterative reconstruction technique. CONTRAST:  OMNIPAQUE  IOHEXOL  300 MG/ML  SOLN COMPARISON:  10/14/2023 FINDINGS: Lower chest:  Aortic valve prosthesis visible on the scout image. Mild descending thoracic aortic atheromatous vascular calcification. Mild dependent atelectasis in the left lower lobe. Hepatobiliary: Cholecystectomy.  Otherwise unremarkable. Pancreas: Abnormal peripancreatic stranding compatible with acute pancreatitis. No findings of pancreatic abscess, mass, or pseudocyst. Spleen: Unremarkable Adrenals/Urinary Tract: Mild retained fetal lobulation of the kidneys. Adrenal glands normal. Urinary bladder unremarkable. Stomach/Bowel: Submucosal fat deposition in the wall of the ascending, transverse, and descending colon and also in the appendix. This can have a weak association with inflammatory bowel disease. Although the appendix is stable from 10/14/2023 and this appearance, the appendix is also stably thickened at 0.9 cm in diameter, without surrounding periappendiceal inflammatory findings. Vascular/Lymphatic: There continues to be swirling of the mesentery in the right pelvis as on image 51 series 2, without dilated bowel or specific complicating feature observed. Patent splenic vein. Atherosclerosis is present, including aortoiliac atherosclerotic disease. Reproductive: Uterus absent. Other: No supplemental non-categorized findings. Musculoskeletal: Degenerative facet arthropathy in the lower lumbar spine. Small supraumbilical hernia contains adipose tissue on image 77 series 6. IMPRESSION: 1. Acute pancreatitis. No findings of pancreatic abscess, mass, or pseudocyst. 2. Submucosal fat deposition in the wall of the ascending, transverse, and descending colon and also in the appendix. This can have a weak association with inflammatory bowel disease. The appendix is also stably thickened at 0.9 cm in diameter, without surrounding periappendiceal inflammatory findings. I suspect that this thickening is probably due to the submucosal fat deposition, with chronic  appendicitis as a less likely differential diagnostic  consideration. 3. Small supraumbilical hernia contains adipose tissue. 4. There is some swirling of mesenteric vessels in the right pelvis, but no bowel dilatation or bowel complication is observed. 5. Degenerative facet arthropathy in the lower lumbar spine. 6.  Aortic Atherosclerosis (ICD10-I70.0). Electronically Signed   By: Freida Jes M.D.   On: 12/09/2023 17:44    Assessment and Plan Sarah Sosa is a 43 y.o. female with medical history significant of Chronic pancreatitis, opioid and alcohol use disorder, aortic insufficiency and ascending aortic aneurysm s/p aortic valve replacement and aneurysm repair on warfarin (September 2023), hypertension, type 2 diabetes, who presents to the ED due to abdominal pain.   Pt has been seen at [place], [place] [facility] and [facility] --frequently. This pt pts 12th visit since Jan 2025  Acute on chronic pancreatitis [facility]) Patient has a long history of chronic pancreatitis with extremely frequent exacerbations when looking at admission and ED visit rate.  Lipase today is elevated compared to prior in addition to acute findings seen on CT imaging.  She is still able to tolerate p.o. intake other than solids. - received IV fluids - Pain control with Tylenol , oxycodone , Dilaudid  -- discussed with patient at length and I did send ambulatory referral for pain clinic at [facility]. Patient asked patient to follow-up with PCP and call do pain clinic if she does not hear from them. --lipase increasing upto 133. Change to CLD and cont IV/PO pain meds   Opioid use disorder --Per chart review, patient has a history of long-term opioid use in the setting of pancreatitis and chronic pain, however, there is documented development of opioid use disorder.  She was previously on Suboxone  + methadone .   Although she does have reason for pain at present, she has previously displayed manipulative behavior in order to receive higher doses of Dilaudid , in addition to malingering ([facility]  records)  Alcohol abuse --Patient states that she continues to drink 1 to 2 glasses of wine daily.  She understands that this can become life-threatening given her comorbidities.  - CIWA monitoring without Ativan  - Folic acid , thiamine  and multivitamin daily   S/p aortic valve replacement with 'On-X" valve (INR target of 1.5 to 2) and ascending aortic graft ([facility], 03/2022) - Warfarin per pharmacy dosing   Essential hypertension Patient has amlodipine , spironolactone  and losartan  on her medication list, however no recent refills.  - Restart amlodipine  and add losartan , spironolactone --since BP is high   (HFpEF) heart failure with preserved ejection fraction (HCC) Patient appears euvolemic at this time.   DM2 (diabetes mellitus, type 2) (HCC) - SSI, moderate   Advance Care Planning:   Code Status: Full Code   Consults: None Family communication :none today DVT Prophylaxis :warfarin Level of care: Telemetry Medical Status is: Inpatient Remains inpatient appropriate because: chronic pain, advancing diet    TOTAL TIME TAKING CARE OF THIS PATIENT: 35 minutes.  >50% time spent on counselling and coordination of care  Note: This dictation was prepared with Dragon dictation along with smaller phrase technology. Any transcriptional errors that result from this process are unintentional.  Melvinia Stager M.D    [facility]   CC: Primary care physician; Clinic, [facility]

## 2023-12-11 NOTE — Consult Note (Signed)
 PHARMACY CONSULT NOTE - FOLLOW UP  Pharmacy Consult for Electrolyte Monitoring and Replacement   Recent Labs: Potassium (mmol/L)  Date Value  12/11/2023 3.4 (L)   Magnesium  (mg/dL)  Date Value  29/56/2130 1.9   Calcium  (mg/dL)  Date Value  86/57/8469 8.9   Albumin (g/dL)  Date Value  62/95/2841 3.5   Phosphorus (mg/dL)  Date Value  32/44/0102 2.6   Sodium (mmol/L)  Date Value  12/11/2023 138   Assessment: Sarah Sosa is a 43 yo female who presented to the ED with abdominal pain, nausea and vomiting for the last 3 days. They have a history significant for chronic pancreatitis, opioid and alcohol use disorder. Pharmacy has been consulted to manage this patient's electrolytes while admitted.   Goal of Therapy:  Electrolytes WNL   Plan:  K+ remains 3.4; 40 mEq dose ordered 5/25 not given  Will retime dose for this morning and change to powder version per patient preference  No other electrolyte replacement indicated at this time  Follow electrolytes with AM labs  Thank you for allowing pharmacy to participate in this patient's care.   Sarah Sosa, PharmD Pharmacy Resident  12/11/2023 7:45 AM

## 2023-12-11 NOTE — Plan of Care (Signed)

## 2023-12-11 NOTE — Progress Notes (Signed)
 PHARMACY - ANTICOAGULATION CONSULT NOTE  Pharmacy Consult for warfarin Indication: Aortic valve replacement with On-X valve  Allergies  Allergen Reactions   Latex Anaphylaxis, Hives and Itching    Other reaction(s): Other (See Comments) SKIN BURNING & PEELING    Nsaids Other (See Comments)    Serve ulcers; stomach aches, GERD.  Other Reaction(s): Other (See Comments)   Tomato Anaphylaxis, Hives and Itching   Gabapentin  Palpitations    Has heart condition; also makes jittery   Metformin      Other Reaction(s): Abdominal Pain  Causes pancreatitis to worsen   Lisinopril      Other Reaction(s): Angioedema   Tramadol Itching and Hives   Duloxetine Anxiety    Makes her jittery   Patient Measurements: Height: 5\' 6"  (167.6 cm) Weight: 116 kg (255 lb 11.7 oz) IBW/kg (Calculated) : 59.3 HEPARIN  DW (KG): 85.7  Vital Signs: Temp: 98.3 F (36.8 C) (05/27 0738) BP: 154/74 (05/27 0738) Pulse Rate: 72 (05/27 0738)  Labs: Recent Labs    12/09/23 1614 12/10/23 0449 12/11/23 0535  HGB 12.8 11.7* 12.7  HCT 39.9 35.5* 37.9  PLT 364 327 305  LABPROT 17.9* 18.6* 17.4*  INR 1.5* 1.5* 1.4*  CREATININE 0.74 0.60 0.67  TROPONINIHS 7  --   --    Estimated Creatinine Clearance: 118.6 mL/min (by C-G formula based on SCr of 0.67 mg/dL).  Medical History: Past Medical History:  Diagnosis Date   Arthritis    Asthma    Chronic pain disorder 03/19/2018   GERD (gastroesophageal reflux disease)    Headache    Heart murmur    History of trichomoniasis 03/19/2018   Hypertension    Intractable nausea and vomiting    Pancreatitis    PID (pelvic inflammatory disease) 02/14/2018   Uterine leiomyoma 03/19/2018   PTA Warfarin Regimen Last dose warfarin before admission: 3.75 mg on 5/25 @ 1400 Mon/Tue/Wed/Fri/Sat/Sun: 3.75 mg  Thus: 7.5 mg   Assessment: 43 y/o female presenting with abdominal pain, nausea, and vomiting. PMH significant for asthma, chronic pancreatitis, chronic pain  syndrome, AVR on chronic anticoagulation (warfarin), T2DM, HTN, and HFpEF. Pharmacy has been consulted to resume and manage home warfarin.  [facility] notes: Of note, patient last seen in anticoagulation clinic at [facility] on 10/19/23, where INR was found to be 5.1 (above goal of 1.5-2.0). At that time, provider adjusted warfarin regimen to 3.75 mg daily (TWD 26.25 mg). Per chart review, previous warfarin regimen prior to office visit on 4/4 was 7.5 mg on Thursdays and 3.75 mg all other days (TWD 30 mg).  Baseline:  INR on admission: 1.5 Baseline labs: hgb 12.8, plt 364  Goal of Therapy:  INR 1.5 - 2.0, confirmed with anticoagulation clinic notes from [facility] Monitor platelets by anticoagulation protocol: Yes  INR  Date/Time  INR Assess        Warfarin Dose 12/09/23@1614  1.5 Therapeutic       Patient took dose before admission  12/10/23@0449  1.5 Therapeutic        3.75 mg 12/11/23@0535             1.4       Subtherapeutic     3.75 mg    Plan:  -- INR just slightly subtherapeutic today at 1.4  -- Will continue with home regimen of warfarin 3.75 mg today  -- Monitor daily INR while admitted -- No major drug-drug interactions with current regimen  -- CBC stable; continue to monitor and for any signs/symptoms of bleeding  Thank you for involving  pharmacy in this patient's care.   Pansy Bogus, PharmD Pharmacy Resident  12/11/2023 7:53 AM

## 2023-12-11 NOTE — Plan of Care (Signed)
 Patient continues to have chronic pain.  Remains free from any noted signs of associated symptoms.  Remains free from any noted signs of acute GI distress.  RN able to perform general assessment.  Skin remains intact.  Lung fields remain clear throughout.  PIV to left forearm remains patent.  Patient to continue to be monitored by hospital staff until discharged.

## 2023-12-12 DIAGNOSIS — K859 Acute pancreatitis without necrosis or infection, unspecified: Secondary | ICD-10-CM | POA: Diagnosis not present

## 2023-12-12 DIAGNOSIS — K861 Other chronic pancreatitis: Secondary | ICD-10-CM | POA: Diagnosis not present

## 2023-12-12 LAB — BASIC METABOLIC PANEL WITH GFR
Anion gap: 13 (ref 5–15)
BUN: <5 mg/dL — ABNORMAL LOW (ref 6–20)
CO2: 22 mmol/L (ref 22–32)
Calcium: 9.4 mg/dL (ref 8.9–10.3)
Chloride: 102 mmol/L (ref 98–111)
Creatinine, Ser: 0.62 mg/dL (ref 0.44–1.00)
GFR, Estimated: >60 mL/min (ref >60)
Glucose, Bld: 110 mg/dL — ABNORMAL HIGH (ref 70–99)
Potassium: 3.4 mmol/L — ABNORMAL LOW (ref 3.5–5.1)
Sodium: 137 mmol/L (ref 135–145)

## 2023-12-12 LAB — LIPASE, BLOOD: Lipase: 67 U/L — ABNORMAL HIGH (ref 11–51)

## 2023-12-12 LAB — GLUCOSE, CAPILLARY
Glucose-Capillary: 120 mg/dL — ABNORMAL HIGH (ref 70–99)
Glucose-Capillary: 104 mg/dL — ABNORMAL HIGH (ref 70–99)

## 2023-12-12 LAB — PROTIME-INR
INR: 1.4 — ABNORMAL HIGH (ref 0.8–1.2)
Prothrombin Time: 17.2 s — ABNORMAL HIGH (ref 11.4–15.2)

## 2023-12-12 LAB — MAGNESIUM: Magnesium: 1.6 mg/dL — ABNORMAL LOW (ref 1.7–2.4)

## 2023-12-12 MED ORDER — POTASSIUM CHLORIDE 20 MEQ PO PACK
40.0000 meq | PACK | Freq: Once | ORAL | Status: AC
  Administered 2023-12-12: 40 meq via ORAL
  Filled 2023-12-12: qty 2

## 2023-12-12 MED ORDER — BUPRENORPHINE HCL 2 MG SL SUBL
4.0000 mg | SUBLINGUAL_TABLET | Freq: Three times a day (TID) | SUBLINGUAL | Status: AC
  Administered 2023-12-12 – 2023-12-13 (×3): 4 mg via SUBLINGUAL
  Filled 2023-12-12 (×3): qty 2

## 2023-12-12 MED ORDER — MAGNESIUM SULFATE 2 GM/50ML IV SOLN
2.0000 g | Freq: Once | INTRAVENOUS | Status: AC
  Administered 2023-12-12: 2 g via INTRAVENOUS
  Filled 2023-12-12: qty 50

## 2023-12-12 MED ORDER — WARFARIN SODIUM 2.5 MG PO TABS
3.7500 mg | ORAL_TABLET | Freq: Once | ORAL | Status: AC
  Administered 2023-12-12: 3.75 mg via ORAL
  Filled 2023-12-12: qty 1

## 2023-12-12 MED ORDER — ACETAMINOPHEN 500 MG PO TABS
500.0000 mg | ORAL_TABLET | ORAL | Status: DC | PRN
  Administered 2023-12-12 – 2023-12-14 (×6): 500 mg via ORAL
  Filled 2023-12-12 (×6): qty 1

## 2023-12-12 NOTE — Consult Note (Signed)
 PHARMACY CONSULT NOTE - FOLLOW UP  Pharmacy Consult for Electrolyte Monitoring and Replacement   Recent Labs: Potassium (mmol/L)  Date Value  12/12/2023 3.4 (L)   Magnesium  (mg/dL)  Date Value  56/21/3086 1.6 (L)   Calcium  (mg/dL)  Date Value  57/84/6962 9.4   Albumin (g/dL)  Date Value  95/28/4132 3.5   Phosphorus (mg/dL)  Date Value  44/07/270 2.6   Sodium (mmol/L)  Date Value  12/12/2023 137   Assessment: Sarah Sosa is a 43 yo female who presented to the ED with abdominal pain, nausea and vomiting for the last 3 days. They have a history significant for chronic pancreatitis, opioid and alcohol use disorder. Pharmacy has been consulted to manage this patient's electrolytes while admitted.   Goal of Therapy:  Electrolytes WNL   Plan:  K+ 3.4 s/p KCl 40 mEq PO x 1 dose given yesterday  Will order an additional KCl 40 mEq PO x 1 dose  Mag 1.6 - will order magnesium  sulfate 2 gm IV x 1 dose  Follow electrolytes with AM labs  Thank you for allowing pharmacy to participate in this patient's care.   Sarah Sosa, PharmD Pharmacy Resident  12/12/2023 7:26 AM

## 2023-12-12 NOTE — Plan of Care (Signed)
  Problem: Metabolic: Goal: Ability to maintain appropriate glucose levels will improve Outcome: Progressing   Problem: Clinical Measurements: Goal: Will remain free from infection Outcome: Progressing   Problem: Coping: Goal: Ability to adjust to condition or change in health will improve Outcome: Not Progressing   Problem: Fluid Volume: Goal: Ability to maintain a balanced intake and output will improve Outcome: Not Progressing   Problem: Health Behavior/Discharge Planning: Goal: Ability to identify and utilize available resources and services will improve Outcome: Not Progressing Goal: Ability to manage health-related needs will improve Outcome: Not Progressing   Problem: Nutritional: Goal: Maintenance of adequate nutrition will improve Outcome: Not Progressing   Problem: Health Behavior/Discharge Planning: Goal: Ability to manage health-related needs will improve Outcome: Not Progressing   Problem: Clinical Measurements: Goal: Ability to maintain clinical measurements within normal limits will improve Outcome: Not Progressing

## 2023-12-12 NOTE — Progress Notes (Signed)
 PROGRESS NOTE    Sarah Sosa   XBJ:[id number] DOB: 03-08-1981  DOA: 12/09/2023 Date of Service: 12/12/23 which is hospital day 2  PCP: Clinic, [facility] course / significant events:   Sarah Sosa is a 43 y.o. female with medical history significant of chronic/recurrent pancreatitis, opioid and alcohol use disorder, aortic insufficiency and ascending aortic aneurysm s/p aortic valve replacement and aneurysm repair on warfarin (September 2023), hypertension, type 2 diabetes, who presents to the ED 12/09/23 due to abdominal pain. Multiple visits here and elsewhere for same / recurrent pancreatitis. This pt pts 12th visit since 07/2023. Slightly elevated lipase and hypokalemia, admitted to hospitalist service 05/25 for IV fluids, pain control. Lipase eventually trending down 05/28 and weaning off IV pain meds.   Of note - PDMP review, she is on suboxone  8-2 mg sl film tid outpatient. Multiple short courses oxycodone  or hydrocodone  IR.    Consultants:  none  Procedures/Surgeries: none      ASSESSMENT & PLAN:   Acute on chronic pancreatitis  Opioid use disorder in setting of chronic pain  Patient has a long history of chronic pancreatitis with extremely frequent exacerbations when looking at admission and ED visit rate.  Unable to tolerate solid po intake Lipase improving  Weaning off IV pain medication - have DC dilaudid   Continue w/ oxycodone  10 mg po q4h prn  Restart buprenorphine  (without naloxone  for now)  Continue CLD, advance to FLD if pt desired     Alcohol abuse Patient states that she continues to drink 1 to 2 glasses of wine daily.  She understands that this can become life-threatening given her comorbidities. CIWA monitoring without Ativan  Folic acid , thiamine  and multivitamin daily   S/p aortic valve replacement with 'On-X" valve (INR target of 1.5 to 2) and ascending aortic graft ([facility], 03/2022) Warfarin per pharmacy dosing   Essential  hypertension Patient has amlodipine , spironolactone  and losartan  on her medication list, however no recent refills. Restart amlodipine  and add losartan , spironolactone --since BP is high   (HFpEF) heart failure with preserved ejection fraction  Patient appears euvolemic at this time. Outpatient follow up Meds as above   DM2 (diabetes mellitus, type 2)  SSI, moderate    Class 2 based on BMI: Body mass index is 39.5 kg/m.Sarah Sosa Significantly low or high BMI is associated with higher medical risk.  Underweight - under 18  overweight - 25 to 29 obese - 30 or more Class 1 obesity: BMI of 30.0 to 34 Class 2 obesity: BMI of 35.0 to 39 Class 3 obesity: BMI of 40.0 to 49 Super Morbid Obesity: BMI 50-59 Super-super Morbid Obesity: BMI 60+ Healthy nutrition and physical activity advised as adjunct to other disease management and risk reduction treatments    DVT prophylaxis: warfarin IV fluids: can dc continuous IV fluids if toelrating po fluids  Nutrition: CLD advance to FLD as able  Central lines / other devices: none  Code Status: FULL CODE ACP documentation reviewed: none on file in [facility]YNCA  TOC needs: none at this tiime Medical barriers to dispo: transitinoing off IV meds onto po. Expected medical readiness for discharge tomorrow.              Subjective / Brief ROS:  Patient reports abd pain Denies CP/SOB.  Denies new weakness.  Tolerating diet clear liquids.  Reports no concerns w/ urination/defecation.   Family Communication: none at bedside     Objective Findings:  Vitals:   12/11/23 2210 12/12/23 0444 12/12/23 0500  12/12/23 0740  BP: (!) 150/88 (!) 163/98  (!) 148/106  Pulse: 78 73  80  Resp:  18  18  Temp:  98.4 F (36.9 C)  98.2 F (36.8 C)  TempSrc:      SpO2:  100%  93%  Weight:   111 kg   Height:       No intake or output data in the 24 hours ending 12/12/23 1337 Filed Weights   12/10/23 0440 12/11/23 0615 12/12/23 0500  Weight: 116.4 kg 116  kg 111 kg    Examination:  Physical Exam Constitutional:      General: She is not in acute distress. Abdominal:     General: Bowel sounds are normal.     Palpations: Abdomen is soft.     Tenderness: There is generalized abdominal tenderness. There is no rebound.  Skin:    General: Skin is warm and dry.  Neurological:     General: No focal deficit present.     Mental Status: She is alert and oriented to person, place, and time.  Psychiatric:        Mood and Affect: Mood is anxious.        Behavior: Behavior normal.          Scheduled Medications:   amLODipine   10 mg Oral Daily   atorvastatin   40 mg Oral Daily   buprenorphine   4 mg Sublingual TID   insulin  aspart  0-15 Units Subcutaneous TID WC   lipase/protease/amylase  36,000 Units Oral TID WC   losartan   50 mg Oral Daily   pantoprazole   40 mg Oral Daily   sodium chloride  flush  3 mL Intravenous Q12H   spironolactone   25 mg Oral Daily   traZODone   100 mg Oral QHS   warfarin  3.75 mg Oral ONCE-1600   Warfarin - Pharmacist Dosing Inpatient   Does not apply q1600    Continuous Infusions:   PRN Medications:  acetaminophen , ondansetron  **OR** ondansetron  (ZOFRAN ) IV, oxyCODONE , polyethylene glycol  Antimicrobials from admission:  Anti-infectives (From admission, onward)    None           Data Reviewed:  I have personally reviewed the following...  CBC: Recent Labs  Lab 12/06/23 1509 12/09/23 1614 12/10/23 0449 12/11/23 0535  WBC 4.7 6.2 9.1 6.3  HGB 12.0 12.8 11.7* 12.7  HCT 35.6* 39.9 35.5* 37.9  MCV 90.8 93.2 91.0 91.5  PLT 358 364 327 305   Basic Metabolic Panel: Recent Labs  Lab 12/06/23 1509 12/09/23 1614 12/10/23 0449 12/11/23 0535 12/12/23 0219  NA 140 138 134* 138 137  K 3.0* 3.2* 3.4* 3.4* 3.4*  CL 110 105 103 106 102  CO2 23 21* 21* 24 22  GLUCOSE 109* 129* 130* 109* 110*  BUN 6 6 6 6  <5*  CREATININE 0.66 0.74 0.60 0.67 0.62  CALCIUM  8.7* 9.0 8.7* 8.9 9.4  MG 1.4* 1.6*  1.3* 1.9 1.6*  PHOS  --   --  2.7 2.6  --    GFR: Estimated Creatinine Clearance: 115.7 mL/min (by C-G formula based on SCr of 0.62 mg/dL). Liver Function Tests: Recent Labs  Lab 12/06/23 1509 12/09/23 1614 12/10/23 0449  AST 46* 48* 31  ALT 47* 47* 34  ALKPHOS 56 72 61  BILITOT 0.8 0.8 0.7  PROT 6.7 7.9 6.7  ALBUMIN 3.4* 4.2 3.5   Recent Labs  Lab 12/06/23 1509 12/09/23 1614 12/11/23 0535 12/12/23 0219  LIPASE 23 126* 133* 67*   No results  for input(s): "AMMONIA" in the last 168 hours. Coagulation Profile: Recent Labs  Lab 12/06/23 2006 12/09/23 1614 12/10/23 0449 12/11/23 0535 12/12/23 0219  INR 1.2 1.5* 1.5* 1.4* 1.4*   Cardiac Enzymes: No results for input(s): "CKTOTAL", "CKMB", "CKMBINDEX", "TROPONINI" in the last 168 hours. BNP (last 3 results) No results for input(s): "PROBNP" in the last 8760 hours. HbA1C: No results for input(s): "HGBA1C" in the last 72 hours. CBG: Recent Labs  Lab 12/11/23 1126 12/11/23 1606 12/11/23 2138 12/12/23 0739 12/12/23 1233  GLUCAP 114* 102* 106* 120* 104*   Lipid Profile: No results for input(s): "CHOL", "HDL", "LDLCALC", "TRIG", "CHOLHDL", "LDLDIRECT" in the last 72 hours. Thyroid Function Tests: No results for input(s): "TSH", "T4TOTAL", "FREET4", "T3FREE", "THYROIDAB" in the last 72 hours. Anemia Panel: No results for input(s): "VITAMINB12", "FOLATE", "FERRITIN", "TIBC", "IRON", "RETICCTPCT" in the last 72 hours. Most Recent Urinalysis On File:     Component Value Date/Time   COLORURINE YELLOW (A) 11/04/2023 1800   APPEARANCEUR HAZY (A) 11/04/2023 1800   LABSPEC 1.020 11/04/2023 1800   PHURINE 5.0 11/04/2023 1800   GLUCOSEU NEGATIVE 11/04/2023 1800   HGBUR SMALL (A) 11/04/2023 1800   BILIRUBINUR NEGATIVE 11/04/2023 1800   KETONESUR 5 (A) 11/04/2023 1800   PROTEINUR 100 (A) 11/04/2023 1800   NITRITE NEGATIVE 11/04/2023 1800   LEUKOCYTESUR NEGATIVE 11/04/2023 1800   Sepsis  Labs: @LABRCNTIP (procalcitonin:4,lacticidven:4) Microbiology: No results found for this or any previous visit (from the past 240 hours).    Radiology Studies last 3 days: CT ABDOMEN PELVIS W CONTRAST Result Date: 12/09/2023 CLINICAL DATA:  Epigastric and suprapubic abdominal pain EXAM: CT ABDOMEN AND PELVIS WITH CONTRAST TECHNIQUE: Multidetector CT imaging of the abdomen and pelvis was performed using the standard protocol following bolus administration of intravenous contrast. RADIATION DOSE REDUCTION: This exam was performed according to the departmental dose-optimization program which includes automated exposure control, adjustment of the mA and/or kV according to patient size and/or use of iterative reconstruction technique. CONTRAST:  OMNIPAQUE  IOHEXOL  300 MG/ML  SOLN COMPARISON:  10/14/2023 FINDINGS: Lower chest: Aortic valve prosthesis visible on the scout image. Mild descending thoracic aortic atheromatous vascular calcification. Mild dependent atelectasis in the left lower lobe. Hepatobiliary: Cholecystectomy.  Otherwise unremarkable. Pancreas: Abnormal peripancreatic stranding compatible with acute pancreatitis. No findings of pancreatic abscess, mass, or pseudocyst. Spleen: Unremarkable Adrenals/Urinary Tract: Mild retained fetal lobulation of the kidneys. Adrenal glands normal. Urinary bladder unremarkable. Stomach/Bowel: Submucosal fat deposition in the wall of the ascending, transverse, and descending colon and also in the appendix. This can have a weak association with inflammatory bowel disease. Although the appendix is stable from 10/14/2023 and this appearance, the appendix is also stably thickened at 0.9 cm in diameter, without surrounding periappendiceal inflammatory findings. Vascular/Lymphatic: There continues to be swirling of the mesentery in the right pelvis as on image 51 series 2, without dilated bowel or specific complicating feature observed. Patent splenic vein.  Atherosclerosis is present, including aortoiliac atherosclerotic disease. Reproductive: Uterus absent. Other: No supplemental non-categorized findings. Musculoskeletal: Degenerative facet arthropathy in the lower lumbar spine. Small supraumbilical hernia contains adipose tissue on image 77 series 6. IMPRESSION: 1. Acute pancreatitis. No findings of pancreatic abscess, mass, or pseudocyst. 2. Submucosal fat deposition in the wall of the ascending, transverse, and descending colon and also in the appendix. This can have a weak association with inflammatory bowel disease. The appendix is also stably thickened at 0.9 cm in diameter, without surrounding periappendiceal inflammatory findings. I suspect that this thickening is probably due  to the submucosal fat deposition, with chronic appendicitis as a less likely differential diagnostic consideration. 3. Small supraumbilical hernia contains adipose tissue. 4. There is some swirling of mesenteric vessels in the right pelvis, but no bowel dilatation or bowel complication is observed. 5. Degenerative facet arthropathy in the lower lumbar spine. 6.  Aortic Atherosclerosis (ICD10-I70.0). Electronically Signed   By: Freida Jes M.D.   On: 12/09/2023 17:44         Erlean Mealor, DO [facility] 12/12/2023, 1:37 PM    Dictation software may have been used to generate the above note. Typos may occur and escape review in typed/dictated notes. Please contact Dr Authur Leghorn directly for clarity if needed.  Staff may message me via secure chat in Epic  but this may not receive an immediate response,  please page me for urgent matters!  If 7PM-7AM, please contact night coverage [email or web address]

## 2023-12-12 NOTE — Hospital Course (Addendum)
 Hospital course / significant events:   Sarah Sosa is a 43 y.o. female with medical history significant of chronic/recurrent pancreatitis, opioid and alcohol use disorder, aortic insufficiency and ascending aortic aneurysm s/p aortic valve replacement and aneurysm repair on warfarin (September 2023), hypertension, type 2 diabetes, who presents to the ED 12/09/23 due to abdominal pain. Multiple visits here and elsewhere for same / recurrent pancreatitis. This pt pts 12th visit since 07/2023. Slightly elevated lipase and hypokalemia, admitted to hospitalist service 05/25 for IV fluids, pain control. Lipase eventually trending down 05/28 and weaning off IV pain meds. Transitioning back onto buprenorphine  4 mg --> 8 mg. Lipase WNL 05/29.   Of note - PDMP review, she is on suboxone  8-2 mg sl film tid outpatient. Multiple short courses oxycodone  or hydrocodone  IR.    Consultants:  none  Procedures/Surgeries: none      ASSESSMENT & PLAN:   Acute on chronic pancreatitis  Opioid use disorder in setting of chronic pain  Patient has a long history of chronic pancreatitis with extremely frequent exacerbations when looking at admission and ED visit rate.  Unable to tolerate solid po intake Lipase improving  off IV pain medication Continue w/ oxycodone  10 mg po q4h prn  Restart buprenorphine  lower dose yesterday, up to 8 mg today  Continue CLD, advance to soft diet per patient request    Alcohol abuse Patient states that she continues to drink 1 to 2 glasses of wine daily.  She understands that this can become life-threatening given her comorbidities. Folic acid , thiamine  and multivitamin daily   S/p aortic valve replacement with 'On-X" valve (INR target of 1.5 to 2) and ascending aortic graft ([facility], 03/2022) Warfarin per pharmacy dosing   Essential hypertension Patient has amlodipine , spironolactone  and losartan  on her medication list, however no recent refills. Restart amlodipine  and add  losartan , spironolactone --since BP is high   (HFpEF) heart failure with preserved ejection fraction  Patient appears euvolemic at this time. Outpatient follow up Meds as above   DM2 (diabetes mellitus, type 2)  SSI, moderate    Class 2 based on BMI: Body mass index is 39.5 kg/m.Aaron Aas Significantly low or high BMI is associated with higher medical risk.  Underweight - under 18  overweight - 25 to 29 obese - 30 or more Class 1 obesity: BMI of 30.0 to 34 Class 2 obesity: BMI of 35.0 to 39 Class 3 obesity: BMI of 40.0 to 49 Super Morbid Obesity: BMI 50-59 Super-super Morbid Obesity: BMI 60+ Healthy nutrition and physical activity advised as adjunct to other disease management and risk reduction treatments    DVT prophylaxis: warfarin IV fluids: can dc continuous IV fluids if toelrating po fluids  Nutrition: CLD advance as able Central lines / other devices: none  Code Status: FULL CODE ACP documentation reviewed: none on file in [facility]CA  TOC needs: none at this tiime Medical barriers to dispo: transitioned off IV meds onto po. Expected medical readiness for discharge tomorrow if tolerating diet .

## 2023-12-12 NOTE — Progress Notes (Signed)
 PHARMACY - ANTICOAGULATION CONSULT NOTE  Pharmacy Consult for warfarin Indication: Aortic valve replacement with On-X valve  Allergies  Allergen Reactions   Latex Anaphylaxis, Hives and Itching    Other reaction(s): Other (See Comments) SKIN BURNING & PEELING    Nsaids Other (See Comments)    Serve ulcers; stomach aches, GERD.  Other Reaction(s): Other (See Comments)   Tomato Anaphylaxis, Hives and Itching   Gabapentin  Palpitations    Has heart condition; also makes jittery   Metformin      Other Reaction(s): Abdominal Pain  Causes pancreatitis to worsen   Lisinopril      Other Reaction(s): Angioedema   Tramadol Itching and Hives   Duloxetine Anxiety    Makes her jittery   Patient Measurements: Height: 5\' 6"  (167.6 cm) Weight: 111 kg (244 lb 11.4 oz) IBW/kg (Calculated) : 59.3 HEPARIN  DW (KG): 85.7  Vital Signs: Temp: 98.4 F (36.9 C) (05/28 0444) BP: 163/98 (05/28 0444) Pulse Rate: 73 (05/28 0444)  Labs: Recent Labs    12/09/23 1614 12/10/23 0449 12/11/23 0535 12/12/23 0219  HGB 12.8 11.7* 12.7  --   HCT 39.9 35.5* 37.9  --   PLT 364 327 305  --   LABPROT 17.9* 18.6* 17.4* 17.2*  INR 1.5* 1.5* 1.4* 1.4*  CREATININE 0.74 0.60 0.67 0.62  TROPONINIHS 7  --   --   --    Estimated Creatinine Clearance: 115.7 mL/min (by C-G formula based on SCr of 0.62 mg/dL).  Medical History: Past Medical History:  Diagnosis Date   Arthritis    Asthma    Chronic pain disorder 03/19/2018   GERD (gastroesophageal reflux disease)    Headache    Heart murmur    History of trichomoniasis 03/19/2018   Hypertension    Intractable nausea and vomiting    Pancreatitis    PID (pelvic inflammatory disease) 02/14/2018   Uterine leiomyoma 03/19/2018   PTA Warfarin Regimen Last dose warfarin before admission: 3.75 mg on 5/25 @ 1400 Mon/Tue/Wed/Fri/Sat/Sun: 3.75 mg  Thus: 7.5 mg   Assessment: 43 y/o female presenting with abdominal pain, nausea, and vomiting. PMH significant  for asthma, chronic pancreatitis, chronic pain syndrome, AVR on chronic anticoagulation (warfarin), T2DM, HTN, and HFpEF. Pharmacy has been consulted to resume and manage home warfarin.  [facility] notes: Of note, patient last seen in anticoagulation clinic at [facility] on 10/19/23, where INR was found to be 5.1 (above goal of 1.5-2.0). At that time, provider adjusted warfarin regimen to 3.75 mg daily (TWD 26.25 mg). Per chart review, previous warfarin regimen prior to office visit on 4/4 was 7.5 mg on Thursdays and 3.75 mg all other days (TWD 30 mg).  Baseline:  INR on admission: 1.5 Baseline labs: hgb 12.8, plt 364  Goal of Therapy:  INR 1.5 - 2.0, confirmed with anticoagulation clinic notes from [facility] Monitor platelets by anticoagulation protocol: Yes  INR  Date/Time  INR Assess        Warfarin Dose 12/09/23@1614  1.5 Therapeutic       Patient took dose before admission  12/10/23@0449  1.5 Therapeutic        3.75 mg 12/11/23@0535             1.4       Subtherapeutic     3.75 mg  12/12/23@0326             1.4       Subtherapeutic     3.75 mg    Plan:  INR remains just slightly subtherapeutic today at 1.4  Continue with home regimen of warfarin 3.75 mg today Home regimen includes larger 7.5 mg dose on Thursdays  May be able to adjust regimen to be more consistent throughout the week , ex. 4 mg daily  Monitor daily INR while admitted No major drug-drug interactions with current regimen  CBC stable; continue to monitor and for any signs/symptoms of bleeding  Thank you for involving pharmacy in this patient's care.   Analis Distler, PharmD Pharmacy Resident  12/12/2023 7:30 AM

## 2023-12-13 DIAGNOSIS — K859 Acute pancreatitis without necrosis or infection, unspecified: Secondary | ICD-10-CM | POA: Diagnosis not present

## 2023-12-13 DIAGNOSIS — K861 Other chronic pancreatitis: Secondary | ICD-10-CM | POA: Diagnosis not present

## 2023-12-13 LAB — COMPREHENSIVE METABOLIC PANEL WITH GFR
ALT: 19 U/L (ref 0–44)
AST: 19 U/L (ref 15–41)
Albumin: 3.7 g/dL (ref 3.5–5.0)
Alkaline Phosphatase: 58 U/L (ref 38–126)
Anion gap: 8 (ref 5–15)
BUN: 6 mg/dL (ref 6–20)
CO2: 24 mmol/L (ref 22–32)
Calcium: 9.4 mg/dL (ref 8.9–10.3)
Chloride: 104 mmol/L (ref 98–111)
Creatinine, Ser: 0.73 mg/dL (ref 0.44–1.00)
GFR, Estimated: >60 mL/min (ref >60)
Glucose, Bld: 100 mg/dL — ABNORMAL HIGH (ref 70–99)
Potassium: 3.7 mmol/L (ref 3.5–5.1)
Sodium: 136 mmol/L (ref 135–145)
Total Bilirubin: 0.7 mg/dL (ref 0.0–1.2)
Total Protein: 6.8 g/dL (ref 6.5–8.1)

## 2023-12-13 LAB — CBC
HCT: 37.7 % (ref 36.0–46.0)
Hemoglobin: 12.6 g/dL (ref 12.0–15.0)
MCH: 30.2 pg (ref 26.0–34.0)
MCHC: 33.4 g/dL (ref 30.0–36.0)
MCV: 90.4 fL (ref 80.0–100.0)
Platelets: 309 10*3/uL (ref 150–400)
RBC: 4.17 MIL/uL (ref 3.87–5.11)
RDW: 13.4 % (ref 11.5–15.5)
WBC: 4.9 10*3/uL (ref 4.0–10.5)
nRBC: 0 % (ref 0.0–0.2)

## 2023-12-13 LAB — MAGNESIUM: Magnesium: 1.7 mg/dL (ref 1.7–2.4)

## 2023-12-13 LAB — LIPASE, BLOOD: Lipase: 40 U/L (ref 11–51)

## 2023-12-13 LAB — PROTIME-INR
INR: 1.6 — ABNORMAL HIGH (ref 0.8–1.2)
Prothrombin Time: 19.3 s — ABNORMAL HIGH (ref 11.4–15.2)

## 2023-12-13 MED ORDER — BUPRENORPHINE HCL 2 MG SL SUBL
8.0000 mg | SUBLINGUAL_TABLET | Freq: Three times a day (TID) | SUBLINGUAL | Status: DC
  Administered 2023-12-13 – 2023-12-14 (×3): 8 mg via SUBLINGUAL
  Filled 2023-12-13 (×3): qty 4

## 2023-12-13 MED ORDER — MAGNESIUM SULFATE 2 GM/50ML IV SOLN
2.0000 g | Freq: Once | INTRAVENOUS | Status: AC
  Administered 2023-12-13: 2 g via INTRAVENOUS
  Filled 2023-12-13: qty 50

## 2023-12-13 MED ORDER — WARFARIN SODIUM 4 MG PO TABS
4.0000 mg | ORAL_TABLET | Freq: Once | ORAL | Status: AC
  Administered 2023-12-13: 4 mg via ORAL
  Filled 2023-12-13: qty 1

## 2023-12-13 NOTE — Progress Notes (Addendum)
 PHARMACY - ANTICOAGULATION CONSULT NOTE  Pharmacy Consult for warfarin Indication: Aortic valve replacement with On-X valve  Allergies  Allergen Reactions   Latex Anaphylaxis, Hives and Itching    Other reaction(s): Other (See Comments) SKIN BURNING & PEELING    Nsaids Other (See Comments)    Serve ulcers; stomach aches, GERD.  Other Reaction(s): Other (See Comments)   Tomato Anaphylaxis, Hives and Itching   Gabapentin  Palpitations    Has heart condition; also makes jittery   Metformin      Other Reaction(s): Abdominal Pain  Causes pancreatitis to worsen   Lisinopril      Other Reaction(s): Angioedema   Tramadol Itching and Hives   Duloxetine Anxiety    Makes her jittery   Patient Measurements: Height: 5\' 6"  (167.6 cm) Weight: 111 kg (244 lb 11.4 oz) IBW/kg (Calculated) : 59.3 HEPARIN  DW (KG): 85.7  Vital Signs: Temp: 98 F (36.7 C) (05/28 2001) Temp Source: Oral (05/28 2001) BP: 134/72 (05/28 2001) Pulse Rate: 66 (05/28 2001)  Labs: Recent Labs    12/11/23 0535 12/12/23 0219 12/13/23 0347  HGB 12.7  --  12.6  HCT 37.9  --  37.7  PLT 305  --  309  LABPROT 17.4* 17.2* 19.3*  INR 1.4* 1.4* 1.6*  CREATININE 0.67 0.62 0.73   Estimated Creatinine Clearance: 115.7 mL/min (by C-G formula based on SCr of 0.73 mg/dL).  Medical History: Past Medical History:  Diagnosis Date   Arthritis    Asthma    Chronic pain disorder 03/19/2018   GERD (gastroesophageal reflux disease)    Headache    Heart murmur    History of trichomoniasis 03/19/2018   Hypertension    Intractable nausea and vomiting    Pancreatitis    PID (pelvic inflammatory disease) 02/14/2018   Uterine leiomyoma 03/19/2018   PTA Warfarin Regimen 3.75 mg daily  Last dose warfarin before admission: 3.75 mg on 5/25 @ 1400  Assessment: 43 y/o female presenting with abdominal pain, nausea, and vomiting. PMH significant for asthma, chronic pancreatitis, chronic pain syndrome, AVR on chronic  anticoagulation (warfarin), T2DM, HTN, and HFpEF. Pharmacy has been consulted to resume and manage home warfarin.  [facility] notes: Of note, patient last seen in anticoagulation clinic at [facility] on 10/19/23, where INR was found to be 5.1 (above goal of 1.5-2.0). At that time, provider adjusted warfarin regimen to 3.75 mg daily (TWD 26.25 mg). Per chart review, previous warfarin regimen prior to office visit on 4/4 was 7.5 mg on Thursdays and 3.75 mg all other days (TWD 30 mg). At this time, regimen was adjusted to 3.75 mg daily.   Baseline:  INR on admission: 1.5 Baseline labs: hgb 12.8, plt 364  Goal of Therapy:  INR 1.5 - 2.0, confirmed with anticoagulation clinic notes from [facility] Monitor platelets by anticoagulation protocol: Yes  INR  Date/Time  INR Assess        Warfarin Dose 12/09/23@1614  1.5 Therapeutic       Patient took dose before admission  12/10/23@0449  1.5 Therapeutic        3.75 mg 12/11/23@0535             1.4       Subtherapeutic     3.75 mg  12/12/23@0326             1.4       Subtherapeutic     3.75 mg  12/13/23@0347             1.6       Therapeutic  4 mg            Plan:  INR therapeutic today at 1.6   Will plan to give warfarin 4 mg today From last anticoagulation visit note; most recent regimen 3.75 mg daily  Monitor daily INR while admitted No major drug-drug interactions with current regimen  CBC stable; continue to monitor and for any signs/symptoms of bleeding  Thank you for involving pharmacy in this patient's care.   Pansy Bogus, PharmD Pharmacy Resident  12/13/2023 7:22 AM

## 2023-12-13 NOTE — Progress Notes (Cosign Needed)
 Patient continues to have pain. Oxycodone  was administered due to her pain scale being scored as an 8 by the patient. Abdomen is soft but patient felt pain when palpated. Patient requested Zofran  due to feeling nauseous. Vital signs stable. Patient is laying in bed. Call bell within reach.

## 2023-12-13 NOTE — Plan of Care (Signed)
  Problem: Education: Goal: Ability to describe self-care measures that may prevent or decrease complications (Diabetes Survival Skills Education) will improve Outcome: Progressing   Problem: Fluid Volume: Goal: Ability to maintain a balanced intake and output will improve Outcome: Progressing   Problem: Health Behavior/Discharge Planning: Goal: Ability to identify and utilize available resources and services will improve Outcome: Progressing   Problem: Metabolic: Goal: Ability to maintain appropriate glucose levels will improve Outcome: Progressing   Problem: Health Behavior/Discharge Planning: Goal: Ability to manage health-related needs will improve Outcome: Progressing   Problem: Activity: Goal: Risk for activity intolerance will decrease Outcome: Progressing   Problem: Elimination: Goal: Will not experience complications related to bowel motility Outcome: Progressing   Problem: Skin Integrity: Goal: Risk for impaired skin integrity will decrease Outcome: Progressing

## 2023-12-13 NOTE — Progress Notes (Signed)
 PROGRESS NOTE    Sarah Sosa   ZOX:[id number] DOB: [birth year]  DOA: 12/09/2023 Date of Service: 12/13/23 which is hospital day 3  PCP: Clinic, [facility] course / significant events:   Sarah Sosa is a 43 y.o. female with medical history significant of chronic/recurrent pancreatitis, opioid and alcohol use disorder, aortic insufficiency and ascending aortic aneurysm s/p aortic valve replacement and aneurysm repair on warfarin (September 2023), hypertension, type 2 diabetes, who presents to the ED 12/09/23 due to abdominal pain. Multiple visits here and elsewhere for same / recurrent pancreatitis. This pt pts 12th visit since 07/2023. Slightly elevated lipase and hypokalemia, admitted to hospitalist service 05/25 for IV fluids, pain control. Lipase eventually trending down 05/28 and weaning off IV pain meds. Transitioning back onto buprenorphine  4 mg --> 8 mg. Lipase WNL 05/29.   Of note - PDMP review, she is on suboxone  8-2 mg sl film tid outpatient. Multiple short courses oxycodone  or hydrocodone  IR.    Consultants:  none  Procedures/Surgeries: none      ASSESSMENT & PLAN:   Acute on chronic pancreatitis  Opioid use disorder in setting of chronic pain  Patient has a long history of chronic pancreatitis with extremely frequent exacerbations when looking at admission and ED visit rate.  Unable to tolerate solid po intake Lipase improving  off IV pain medication Continue w/ oxycodone  10 mg po q4h prn  Restart buprenorphine  lower dose yesterday, up to 8 mg today  Continue CLD, advance to soft diet per patient request    Alcohol abuse Patient states that she continues to drink 1 to 2 glasses of wine daily.  She understands that this can become life-threatening given her comorbidities. Folic acid , thiamine  and multivitamin daily   S/p aortic valve replacement with 'On-X" valve (INR target of 1.5 to 2) and ascending aortic graft ([facility], 03/2022) Warfarin per  pharmacy dosing   Essential hypertension Patient has amlodipine , spironolactone  and losartan  on her medication list, however no recent refills. Restart amlodipine  and add losartan , spironolactone --since BP is high   (HFpEF) heart failure with preserved ejection fraction  Patient appears euvolemic at this time. Outpatient follow up Meds as above   DM2 (diabetes mellitus, type 2)  SSI, moderate    Class 2 based on BMI: Body mass index is 39.5 kg/m.Aaron Aas Significantly low or high BMI is associated with higher medical risk.  Underweight - under 18  overweight - 25 to 29 obese - 30 or more Class 1 obesity: BMI of 30.0 to 34 Class 2 obesity: BMI of 35.0 to 39 Class 3 obesity: BMI of 40.0 to 49 Super Morbid Obesity: BMI 50-59 Super-super Morbid Obesity: BMI 60+ Healthy nutrition and physical activity advised as adjunct to other disease management and risk reduction treatments    DVT prophylaxis: warfarin IV fluids: can dc continuous IV fluids if toelrating po fluids  Nutrition: CLD advance as able Central lines / other devices: none  Code Status: FULL CODE ACP documentation reviewed: none on file in [facility]  TOC needs: none at this tiime Medical barriers to dispo: transitioned off IV meds onto po. Expected medical readiness for discharge tomorrow if tolerating diet .              Subjective / Brief ROS:  Patient reports abd pain a bit better from yesterday, feels ready to try soft diet  Denies CP/SOB.  Denies new weakness.  Tolerating diet clear liquids.  Reports no concerns w/ urination/defecation.   Family Communication:  none at bedside     Objective Findings:  Vitals:   12/12/23 2001 12/13/23 0500 12/13/23 0806 12/13/23 0901  BP: 134/72  124/68 124/68  Pulse: 66  65   Resp: 16  18   Temp: 98 F (36.7 C)  98.7 F (37.1 C)   TempSrc: Oral  Oral   SpO2: 96%  95%   Weight:  111 kg    Height:        Intake/Output Summary (Last 24 hours) at 12/13/2023  1215 Last data filed at 12/12/2023 1857 Gross per 24 hour  Intake 240 ml  Output --  Net 240 ml   Filed Weights   12/11/23 0615 12/12/23 0500 12/13/23 0500  Weight: 116 kg 111 kg 111 kg    Examination:  Physical Exam Constitutional:      General: She is not in acute distress. Abdominal:     General: Bowel sounds are normal.     Palpations: Abdomen is soft.     Tenderness: There is generalized abdominal tenderness. There is no rebound.  Skin:    General: Skin is warm and dry.  Neurological:     General: No focal deficit present.     Mental Status: She is alert and oriented to person, place, and time.  Psychiatric:        Mood and Affect: Mood is anxious.        Behavior: Behavior normal.          Scheduled Medications:   amLODipine   10 mg Oral Daily   atorvastatin   40 mg Oral Daily   buprenorphine   8 mg Sublingual TID   insulin  aspart  0-15 Units Subcutaneous TID WC   lipase/protease/amylase  36,000 Units Oral TID WC   losartan   50 mg Oral Daily   pantoprazole   40 mg Oral Daily   sodium chloride  flush  3 mL Intravenous Q12H   spironolactone   25 mg Oral Daily   traZODone   100 mg Oral QHS   warfarin  4 mg Oral ONCE-1600   Warfarin - Pharmacist Dosing Inpatient   Does not apply q1600    Continuous Infusions:   PRN Medications:  acetaminophen , ondansetron  **OR** ondansetron  (ZOFRAN ) IV, oxyCODONE , polyethylene glycol  Antimicrobials from admission:  Anti-infectives (From admission, onward)    None           Data Reviewed:  I have personally reviewed the following...  CBC: Recent Labs  Lab 12/06/23 1509 12/09/23 1614 12/10/23 0449 12/11/23 0535 12/13/23 0347  WBC 4.7 6.2 9.1 6.3 4.9  HGB 12.0 12.8 11.7* 12.7 12.6  HCT 35.6* 39.9 35.5* 37.9 37.7  MCV 90.8 93.2 91.0 91.5 90.4  PLT 358 364 327 305 309   Basic Metabolic Panel: Recent Labs  Lab 12/09/23 1614 12/10/23 0449 12/11/23 0535 12/12/23 0219 12/13/23 0347  NA 138 134* 138 137  136  K 3.2* 3.4* 3.4* 3.4* 3.7  CL 105 103 106 102 104  CO2 21* 21* 24 22 24   GLUCOSE 129* 130* 109* 110* 100*  BUN 6 6 6  <5* 6  CREATININE 0.74 0.60 0.67 0.62 0.73  CALCIUM  9.0 8.7* 8.9 9.4 9.4  MG 1.6* 1.3* 1.9 1.6* 1.7  PHOS  --  2.7 2.6  --   --    GFR: Estimated Creatinine Clearance: 115.7 mL/min (by C-G formula based on SCr of 0.73 mg/dL). Liver Function Tests: Recent Labs  Lab 12/06/23 1509 12/09/23 1614 12/10/23 0449 12/13/23 0347  AST 46* 48* 31 19  ALT 47*  47* 34 19  ALKPHOS 56 72 61 58  BILITOT 0.8 0.8 0.7 0.7  PROT 6.7 7.9 6.7 6.8  ALBUMIN 3.4* 4.2 3.5 3.7   Recent Labs  Lab 12/06/23 1509 12/09/23 1614 12/11/23 0535 12/12/23 0219 12/13/23 0347  LIPASE 23 126* 133* 67* 40   No results for input(s): "AMMONIA" in the last 168 hours. Coagulation Profile: Recent Labs  Lab 12/09/23 1614 12/10/23 0449 12/11/23 0535 12/12/23 0219 12/13/23 0347  INR 1.5* 1.5* 1.4* 1.4* 1.6*   Cardiac Enzymes: No results for input(s): "CKTOTAL", "CKMB", "CKMBINDEX", "TROPONINI" in the last 168 hours. BNP (last 3 results) No results for input(s): "PROBNP" in the last 8760 hours. HbA1C: No results for input(s): "HGBA1C" in the last 72 hours. CBG: Recent Labs  Lab 12/11/23 1126 12/11/23 1606 12/11/23 2138 12/12/23 0739 12/12/23 1233  GLUCAP 114* 102* 106* 120* 104*   Lipid Profile: No results for input(s): "CHOL", "HDL", "LDLCALC", "TRIG", "CHOLHDL", "LDLDIRECT" in the last 72 hours. Thyroid Function Tests: No results for input(s): "TSH", "T4TOTAL", "FREET4", "T3FREE", "THYROIDAB" in the last 72 hours. Anemia Panel: No results for input(s): "VITAMINB12", "FOLATE", "FERRITIN", "TIBC", "IRON", "RETICCTPCT" in the last 72 hours. Most Recent Urinalysis On File:     Component Value Date/Time   COLORURINE YELLOW (A) 11/04/2023 1800   APPEARANCEUR HAZY (A) 11/04/2023 1800   LABSPEC 1.020 11/04/2023 1800   PHURINE 5.0 11/04/2023 1800   GLUCOSEU NEGATIVE 11/04/2023  1800   HGBUR SMALL (A) 11/04/2023 1800   BILIRUBINUR NEGATIVE 11/04/2023 1800   KETONESUR 5 (A) 11/04/2023 1800   PROTEINUR 100 (A) 11/04/2023 1800   NITRITE NEGATIVE 11/04/2023 1800   LEUKOCYTESUR NEGATIVE 11/04/2023 1800   Sepsis Labs: @LABRCNTIP (procalcitonin:4,lacticidven:4) Microbiology: No results found for this or any previous visit (from the past 240 hours).    Radiology Studies last 3 days: CT ABDOMEN PELVIS W CONTRAST Result Date: 12/09/2023 CLINICAL DATA:  Epigastric and suprapubic abdominal pain EXAM: CT ABDOMEN AND PELVIS WITH CONTRAST TECHNIQUE: Multidetector CT imaging of the abdomen and pelvis was performed using the standard protocol following bolus administration of intravenous contrast. RADIATION DOSE REDUCTION: This exam was performed according to the departmental dose-optimization program which includes automated exposure control, adjustment of the mA and/or kV according to patient size and/or use of iterative reconstruction technique. CONTRAST:  OMNIPAQUE  IOHEXOL  300 MG/ML  SOLN COMPARISON:  10/14/2023 FINDINGS: Lower chest: Aortic valve prosthesis visible on the scout image. Mild descending thoracic aortic atheromatous vascular calcification. Mild dependent atelectasis in the left lower lobe. Hepatobiliary: Cholecystectomy.  Otherwise unremarkable. Pancreas: Abnormal peripancreatic stranding compatible with acute pancreatitis. No findings of pancreatic abscess, mass, or pseudocyst. Spleen: Unremarkable Adrenals/Urinary Tract: Mild retained fetal lobulation of the kidneys. Adrenal glands normal. Urinary bladder unremarkable. Stomach/Bowel: Submucosal fat deposition in the wall of the ascending, transverse, and descending colon and also in the appendix. This can have a weak association with inflammatory bowel disease. Although the appendix is stable from 10/14/2023 and this appearance, the appendix is also stably thickened at 0.9 cm in diameter, without surrounding  periappendiceal inflammatory findings. Vascular/Lymphatic: There continues to be swirling of the mesentery in the right pelvis as on image 51 series 2, without dilated bowel or specific complicating feature observed. Patent splenic vein. Atherosclerosis is present, including aortoiliac atherosclerotic disease. Reproductive: Uterus absent. Other: No supplemental non-categorized findings. Musculoskeletal: Degenerative facet arthropathy in the lower lumbar spine. Small supraumbilical hernia contains adipose tissue on image 77 series 6. IMPRESSION: 1. Acute pancreatitis. No findings of pancreatic abscess, mass, or  pseudocyst. 2. Submucosal fat deposition in the wall of the ascending, transverse, and descending colon and also in the appendix. This can have a weak association with inflammatory bowel disease. The appendix is also stably thickened at 0.9 cm in diameter, without surrounding periappendiceal inflammatory findings. I suspect that this thickening is probably due to the submucosal fat deposition, with chronic appendicitis as a less likely differential diagnostic consideration. 3. Small supraumbilical hernia contains adipose tissue. 4. There is some swirling of mesenteric vessels in the right pelvis, but no bowel dilatation or bowel complication is observed. 5. Degenerative facet arthropathy in the lower lumbar spine. 6.  Aortic Atherosclerosis (ICD10-I70.0). Electronically Signed   By: Freida Jes M.D.   On: 12/09/2023 17:44         Melodi Sprung, DO [facility] 12/13/2023, 12:15 PM    Dictation software may have been used to generate the above note. Typos may occur and escape review in typed/dictated notes. Please contact Dr Authur Leghorn directly for clarity if needed.  Staff may message me via secure chat in Epic  but this may not receive an immediate response,  please page me for urgent matters!  If 7PM-7AM, please contact night coverage [email or web address]

## 2023-12-13 NOTE — TOC Progression Note (Signed)
 Transition of Care [facility]) - Progression Note    Patient Details  Name: Sarah Sosa MRN: [id number] Date of Birth: February 17, 1981  [facility] CM/SW Contact  Simra Fiebig A Aariz Maish, RN Phone Number: 12/13/2023, 9:14 AM  Clinical Narrative:    Chart reviewed. Noted that patient is weaning off IV pain medication to po medications.  I have added substance abuse resources to discharge instructions.    TOC will continue to follow.          Expected Discharge Plan and Services                                               Social Determinants of Health (SDOH) Interventions SDOH Screenings   Food Insecurity: No Food Insecurity (12/09/2023)  Housing: Low Risk  (12/09/2023)  Recent Concern: Housing - High Risk (09/15/2023)   Received from [facility]  Transportation Needs: No Transportation Needs (12/09/2023)  Utilities: Not At Risk (12/09/2023)  Financial Resource Strain: High Risk (07/19/2023)   Received from [facility]  Social Connections: Moderately Isolated (12/09/2023)  Stress: No Stress Concern Present (08/31/2023)   Received from [facility]  Tobacco Use: High Risk (12/09/2023)    Readmission Risk Interventions    11/08/2023    2:16 PM 10/24/2023   11:27 AM 09/20/2023   10:58 AM  Readmission Risk Prevention Plan  Transportation Screening Complete Complete Complete  Medication Review [employer]) -- Complete Complete  PCP or Specialist appointment within 3-5 days of discharge Complete Complete Complete  HRI or Home Care Consult Not Complete  Complete  HRI or Home Care Consult Pt Refusal Comments Not applicable    SW Recovery Care/Counseling Consult  Complete Complete  Palliative Care Screening Not Applicable Not Applicable Not Applicable  Skilled Nursing Facility Not Applicable Not Applicable Not Applicable

## 2023-12-13 NOTE — Consult Note (Signed)
 PHARMACY CONSULT NOTE - FOLLOW UP  Pharmacy Consult for Electrolyte Monitoring and Replacement   Recent Labs: Potassium (mmol/L)  Date Value  12/13/2023 3.7   Magnesium  (mg/dL)  Date Value  16/04/9603 1.7   Calcium  (mg/dL)  Date Value  54/03/8118 9.4   Albumin (g/dL)  Date Value  14/78/2956 3.7   Phosphorus (mg/dL)  Date Value  21/30/8657 2.6   Sodium (mmol/L)  Date Value  12/13/2023 136   Assessment: SB is a 43 yo female who presented to the ED with abdominal pain, nausea and vomiting for the last 3 days. They have a history significant for chronic pancreatitis, opioid and alcohol use disorder. Pharmacy has been consulted to manage this patient's electrolytes while admitted.   Goal of Therapy:  Electrolytes WNL   Plan:  Mag 1.7 s/p magnesium  sulfate 2 gm IV x 1 dose given yesterday  Will order an additional 2 gm IV x 1 dose this morning  Follow electrolytes with AM labs  Thank you for allowing pharmacy to participate in this patient's care.   Pansy Bogus, PharmD Pharmacy Resident  12/13/2023 7:18 AM

## 2023-12-13 NOTE — Discharge Instructions (Signed)

## 2023-12-14 ENCOUNTER — Other Ambulatory Visit: Payer: Self-pay

## 2023-12-14 ENCOUNTER — Other Ambulatory Visit (HOSPITAL_COMMUNITY): Payer: Self-pay

## 2023-12-14 DIAGNOSIS — K859 Acute pancreatitis without necrosis or infection, unspecified: Secondary | ICD-10-CM | POA: Diagnosis not present

## 2023-12-14 DIAGNOSIS — K861 Other chronic pancreatitis: Secondary | ICD-10-CM | POA: Diagnosis not present

## 2023-12-14 LAB — COMPREHENSIVE METABOLIC PANEL WITH GFR
ALT: 15 U/L (ref 0–44)
AST: 15 U/L (ref 15–41)
Albumin: 3.7 g/dL (ref 3.5–5.0)
Alkaline Phosphatase: 53 U/L (ref 38–126)
Anion gap: 7 (ref 5–15)
BUN: 13 mg/dL (ref 6–20)
CO2: 25 mmol/L (ref 22–32)
Calcium: 9.3 mg/dL (ref 8.9–10.3)
Chloride: 103 mmol/L (ref 98–111)
Creatinine, Ser: 0.96 mg/dL (ref 0.44–1.00)
GFR, Estimated: >60 mL/min (ref >60)
Glucose, Bld: 141 mg/dL — ABNORMAL HIGH (ref 70–99)
Potassium: 3.8 mmol/L (ref 3.5–5.1)
Sodium: 135 mmol/L (ref 135–145)
Total Bilirubin: 0.5 mg/dL (ref 0.0–1.2)
Total Protein: 6.8 g/dL (ref 6.5–8.1)

## 2023-12-14 LAB — CBC
HCT: 37.2 % (ref 36.0–46.0)
Hemoglobin: 12.4 g/dL (ref 12.0–15.0)
MCH: 30.3 pg (ref 26.0–34.0)
MCHC: 33.3 g/dL (ref 30.0–36.0)
MCV: 91 fL (ref 80.0–100.0)
Platelets: 309 10*3/uL (ref 150–400)
RBC: 4.09 MIL/uL (ref 3.87–5.11)
RDW: 13.7 % (ref 11.5–15.5)
WBC: 5.1 10*3/uL (ref 4.0–10.5)
nRBC: 0 % (ref 0.0–0.2)

## 2023-12-14 LAB — PROTIME-INR
INR: 1.9 — ABNORMAL HIGH (ref 0.8–1.2)
Prothrombin Time: 21.6 s — ABNORMAL HIGH (ref 11.4–15.2)

## 2023-12-14 LAB — LIPASE, BLOOD: Lipase: 40 U/L (ref 11–51)

## 2023-12-14 LAB — GLUCOSE, CAPILLARY: Glucose-Capillary: 152 mg/dL — ABNORMAL HIGH (ref 70–99)

## 2023-12-14 LAB — MAGNESIUM: Magnesium: 1.9 mg/dL (ref 1.7–2.4)

## 2023-12-14 MED ORDER — BUPRENORPHINE HCL 8 MG SL SUBL
8.0000 mg | SUBLINGUAL_TABLET | Freq: Three times a day (TID) | SUBLINGUAL | 0 refills | Status: DC
  Filled 2023-12-14: qty 15, 5d supply, fill #0

## 2023-12-14 MED ORDER — MAGNESIUM CHLORIDE 64 MG PO TBEC
1.0000 | DELAYED_RELEASE_TABLET | Freq: Once | ORAL | Status: AC
  Administered 2023-12-14: 64 mg via ORAL
  Filled 2023-12-14: qty 1

## 2023-12-14 MED ORDER — MAGNESIUM SULFATE 2 GM/50ML IV SOLN
2.0000 g | Freq: Once | INTRAVENOUS | Status: DC
  Filled 2023-12-14: qty 50

## 2023-12-14 MED ORDER — MAGNESIUM CHLORIDE 64 MG PO TBEC: 1.0000 | DELAYED_RELEASE_TABLET | Freq: Every day | ORAL | Status: AC

## 2023-12-14 MED ORDER — OXYCODONE HCL 5 MG PO TABS
5.0000 mg | ORAL_TABLET | ORAL | Status: DC | PRN
  Administered 2023-12-14: 5 mg via ORAL
  Filled 2023-12-14: qty 1

## 2023-12-14 MED ORDER — OXYCODONE HCL 5 MG PO TABS: 5.0000 mg | ORAL_TABLET | ORAL | 0 refills | Status: DC | PRN

## 2023-12-14 MED ORDER — OXYCODONE HCL 5 MG PO TABS
5.0000 mg | ORAL_TABLET | ORAL | 0 refills | Status: DC | PRN
  Filled 2023-12-14: qty 30, 5d supply, fill #0

## 2023-12-14 MED ORDER — WARFARIN SODIUM 3 MG PO TABS: 3.0000 mg | ORAL_TABLET | Freq: Once | ORAL | Status: DC

## 2023-12-14 MED ORDER — WARFARIN SODIUM 3 MG PO TABS
3.0000 mg | ORAL_TABLET | Freq: Once | ORAL | Status: AC
  Administered 2023-12-14: 3 mg via ORAL
  Filled 2023-12-14: qty 1

## 2023-12-14 MED ORDER — BUPRENORPHINE HCL 8 MG SL SUBL
SUBLINGUAL_TABLET | SUBLINGUAL | 0 refills | Status: DC
  Filled 2023-12-14: qty 9, 3d supply, fill #0

## 2023-12-14 MED ORDER — BUPRENORPHINE HCL 8 MG SL SUBL: 8.0000 mg | SUBLINGUAL_TABLET | Freq: Three times a day (TID) | SUBLINGUAL | 0 refills | Status: AC

## 2023-12-14 MED ORDER — WARFARIN SODIUM 7.5 MG PO TABS: 3.7500 mg | ORAL_TABLET | Freq: Every day | ORAL | Status: DC

## 2023-12-14 NOTE — Discharge Summary (Signed)
 Physician Discharge Summary   Patient: Sarah Sosa MRN: [id number]  DOB: [birth year]   Admit:     Date of Admission: 12/09/2023 Admitted from: home   Discharge: Date of discharge: 12/14/23 Disposition: Home Condition at discharge: good  CODE STATUS: FULL CODE     Discharge Physician: Melodi Sprung, DO [facility]     PCP: [facility], [facility]  Recommendations for Outpatient Follow-up:  Follow up with PCP Clinic, [facility] in 1-2 weeks   Discharge Instructions     Ambulatory referral to Pain Clinic   Complete by: As directed          Discharge Diagnoses: Principal Problem:   Acute on chronic pancreatitis [facility]) Active Problems:   Opioid use disorder   Alcohol abuse   Essential hypertension   S/p aortic valve replacement with 'On-X" valve (INR target of 1.5 to 2) and ascending aortic graft ([facility], 03/2022)   (HFpEF) heart failure with preserved ejection fraction (HCC)   DM2 (diabetes mellitus, type 2) [facility])        Hospital course / significant events:   Gera Inboden is a 43 y.o. female with medical history significant of chronic/recurrent pancreatitis, opioid and alcohol use disorder, aortic insufficiency and ascending aortic aneurysm s/p aortic valve replacement and aneurysm repair on warfarin (September 2023), hypertension, type 2 diabetes, who presents to the ED 12/09/23 due to abdominal pain. Multiple visits here and elsewhere for same / recurrent pancreatitis. This pt pts 12th visit since 07/2023. Slightly elevated lipase and hypokalemia, admitted to hospitalist service 05/25 for IV fluids, pain control. Lipase eventually trending down 05/28 and weaning off IV pain meds. Transitioning back onto buprenorphine  4 mg --> 8 mg. Lipase WNL 05/29.   Of note - PDMP review, she is on suboxone  8-2 mg sl film tid outpatient. Multiple short courses oxycodone  or hydrocodone  IR.    Consultants:   none  Procedures/Surgeries: none      ASSESSMENT & PLAN:   Acute on chronic pancreatitis  Acute abdominal pain d/t pancreatitis  Opioid use disorder in setting of chronic pain  Patient has a long history of chronic pancreatitis with frequent exacerbations  Lipase now normal x2 days  off IV pain medication Oxycodone  10 mg po q4h prn --> 5 mg  Buprenorphine  8 mg  Hold suboxone  for now, restart when completed buprenorphine  + oxycodone     Alcohol abuse Patient states that she continues to drink 1 to 2 glasses of wine daily.  She understands that this can become life-threatening given her comorbidities. Folic acid , thiamine  and multivitamin daily   S/p aortic valve replacement with 'On-X" valve (INR target of 1.5 to 2) and ascending aortic graft ([facility], 03/2022) Warfarin per pharmacy dosing - resume 3.75 mg daily tomorrow    Essential hypertension (HFpEF) heart failure with preserved ejection fraction  Patient appears euvolemic at this time. Outpatient follow up amlodipine , losartan , spironolactone    DM2 (diabetes mellitus, type 2)  STOP GLP1 d/t pancreatitis Outpatient follow up     Class 2 based on BMI: Body mass index is 39.5 kg/m.Aaron Aas Significantly low or high BMI is associated with higher medical risk.  Underweight - under 18  overweight - 25 to 29 obese - 30 or more Class 1 obesity: BMI of 30.0 to 34 Class 2 obesity: BMI of 35.0 to 39 Class 3 obesity: BMI of 40.0 to 49 Super Morbid Obesity: BMI 50-59 Super-super Morbid Obesity: BMI 60+ Healthy nutrition and physical activity advised as adjunct to other disease management  and risk reduction treatments       Discharge Instructions  Allergies as of 12/14/2023       Reactions   Latex Anaphylaxis, Hives, Itching   Other reaction(s): Other (See Comments) SKIN BURNING & PEELING   Nsaids Other (See Comments)   Serve ulcers; stomach aches, GERD. Other Reaction(s): Other (See Comments)   Tomato Anaphylaxis,  Hives, Itching   Gabapentin  Palpitations   Has heart condition; also makes jittery   Metformin     Other Reaction(s): Abdominal Pain Causes pancreatitis to worsen   Lisinopril     Other Reaction(s): Angioedema   Tramadol Itching, Hives   Duloxetine Anxiety   Makes her jittery        Medication List     PAUSE taking these medications    buprenorphine -naloxone  8-2 mg Subl SL tablet Wait to take this until: December 19, 2023 Commonly known as: SUBOXONE  Place 3 tablets under the tongue daily.       STOP taking these medications    enoxaparin  120 MG/0.8ML injection Commonly known as: LOVENOX    Semaglutide (1 MG/DOSE) 4 MG/3ML Sopn       TAKE these medications    albuterol  108 (90 Base) MCG/ACT inhaler Commonly known as: VENTOLIN  HFA Inhale 1-2 puffs into the lungs every 6 (six) hours as needed.   amitriptyline  10 MG tablet Commonly known as: ELAVIL  Take 10 mg by mouth 3 (three) times daily as needed for sleep (or pain).   amLODipine  10 MG tablet Commonly known as: NORVASC  Take 1 tablet (10 mg total) by mouth daily.   atorvastatin  40 MG tablet Commonly known as: LIPITOR Take 40 mg by mouth daily.   buprenorphine  8 MG Subl SL tablet Commonly known as: SUBUTEX  Place 1 tablet (8 mg total) under the tongue 3 (three) times daily for 15 doses.   furosemide  40 MG tablet Commonly known as: LASIX  Take 0.5 tablets (20 mg total) by mouth daily as needed. Home med.   lipase/protease/amylase 13244 UNITS Cpep capsule Commonly known as: CREON  Take 1 capsule (36,000 Units total) by mouth 3 (three) times daily with meals.   losartan  50 MG tablet Commonly known as: COZAAR  Take 1 tablet (50 mg total) by mouth daily.   magnesium  chloride 64 MG Tbec SR tablet Commonly known as: SLOW-MAG Take 1 tablet (64 mg total) by mouth daily.   metFORMIN  500 MG tablet Commonly known as: GLUCOPHAGE  Take 500 mg by mouth every morning.   naloxone  4 MG/0.1ML Liqd nasal spray  kit Commonly known as: NARCAN  Place 1 spray into the nose once as needed (to reverse overdose).   omeprazole 40 MG capsule Commonly known as: PRILOSEC Take 40 mg by mouth daily.   ondansetron  4 MG tablet Commonly known as: Zofran  Take 1 tablet (4 mg total) by mouth daily as needed for nausea or vomiting.   oxyCODONE  5 MG immediate release tablet Commonly known as: Oxy IR/ROXICODONE  Take 1 tablet (5 mg total) by mouth every 4 (four) hours as needed for moderate pain (pain score 4-6) or severe pain (pain score 7-10). What changed:  medication strength how much to take when to take this reasons to take this   spironolactone  25 MG tablet Commonly known as: ALDACTONE  Take 25 mg by mouth daily.   traZODone  50 MG tablet Commonly known as: DESYREL  Take 100 mg by mouth at bedtime.   warfarin 7.5 MG tablet Commonly known as: COUMADIN  Take 0.5 tablets (3.75 mg total) by mouth daily. What changed:  how much to take  additional instructions          Allergies  Allergen Reactions   Latex Anaphylaxis, Hives and Itching    Other reaction(s): Other (See Comments) SKIN BURNING & PEELING    Nsaids Other (See Comments)    Serve ulcers; stomach aches, GERD.  Other Reaction(s): Other (See Comments)   Tomato Anaphylaxis, Hives and Itching   Gabapentin  Palpitations    Has heart condition; also makes jittery   Metformin      Other Reaction(s): Abdominal Pain  Causes pancreatitis to worsen   Lisinopril      Other Reaction(s): Angioedema   Tramadol Itching and Hives   Duloxetine Anxiety    Makes her jittery     Subjective: pt reports po intake is better today, pain is controlled but still present/chronic. No nausea/vomiting   Discharge Exam: BP 118/67 (BP Location: Right Arm)   Pulse 75   Temp 97.9 F (36.6 C) (Oral)   Resp 18   Ht 5\' 6"  (1.676 m)   Wt 110.9 kg   LMP 03/25/2018 (Exact Date)   SpO2 96%   BMI 39.46 kg/m  General: Pt is alert, awake, not in acute  distress Cardiovascular: RRR, S1/S2 +murmur, no rubs, no gallops Respiratory: CTA bilaterally, no wheezing, no rhonchi Abdominal: Soft, NT, ND, bowel sounds + Extremities: no edema, no cyanosis     The results of significant diagnostics from this hospitalization (including imaging, microbiology, ancillary and laboratory) are listed below for reference.     Microbiology: No results found for this or any previous visit (from the past 240 hours).   Labs: BNP (last 3 results) No results for input(s): "BNP" in the last 8760 hours. Basic Metabolic Panel: Recent Labs  Lab 12/10/23 0449 12/11/23 0535 12/12/23 0219 12/13/23 0347 12/14/23 0415  NA 134* 138 137 136 135  K 3.4* 3.4* 3.4* 3.7 3.8  CL 103 106 102 104 103  CO2 21* 24 22 24 25   GLUCOSE 130* 109* 110* 100* 141*  BUN 6 6 <5* 6 13  CREATININE 0.60 0.67 0.62 0.73 0.96  CALCIUM  8.7* 8.9 9.4 9.4 9.3  MG 1.3* 1.9 1.6* 1.7 1.9  PHOS 2.7 2.6  --   --   --    Liver Function Tests: Recent Labs  Lab 12/09/23 1614 12/10/23 0449 12/13/23 0347 12/14/23 0415  AST 48* 31 19 15   ALT 47* 34 19 15  ALKPHOS 72 61 58 53  BILITOT 0.8 0.7 0.7 0.5  PROT 7.9 6.7 6.8 6.8  ALBUMIN 4.2 3.5 3.7 3.7   Recent Labs  Lab 12/09/23 1614 12/11/23 0535 12/12/23 0219 12/13/23 0347 12/14/23 0415  LIPASE 126* 133* 67* 40 40   No results for input(s): "AMMONIA" in the last 168 hours. CBC: Recent Labs  Lab 12/09/23 1614 12/10/23 0449 12/11/23 0535 12/13/23 0347 12/14/23 0415  WBC 6.2 9.1 6.3 4.9 5.1  HGB 12.8 11.7* 12.7 12.6 12.4  HCT 39.9 35.5* 37.9 37.7 37.2  MCV 93.2 91.0 91.5 90.4 91.0  PLT 364 327 305 309 309   Cardiac Enzymes: No results for input(s): "CKTOTAL", "CKMB", "CKMBINDEX", "TROPONINI" in the last 168 hours. BNP: Invalid input(s): "POCBNP" CBG: Recent Labs  Lab 12/11/23 1606 12/11/23 2138 12/12/23 0739 12/12/23 1233 12/14/23 1130  GLUCAP 102* 106* 120* 104* 152*   D-Dimer No results for input(s):  "DDIMER" in the last 72 hours. Hgb A1c No results for input(s): "HGBA1C" in the last 72 hours. Lipid Profile No results for input(s): "CHOL", "HDL", "LDLCALC", "TRIG", "CHOLHDL", "LDLDIRECT" in  the last 72 hours. Thyroid function studies No results for input(s): "TSH", "T4TOTAL", "T3FREE", "THYROIDAB" in the last 72 hours.  Invalid input(s): "FREET3" Anemia work up No results for input(s): "VITAMINB12", "FOLATE", "FERRITIN", "TIBC", "IRON", "RETICCTPCT" in the last 72 hours. Urinalysis    Component Value Date/Time   COLORURINE YELLOW (A) 11/04/2023 1800   APPEARANCEUR HAZY (A) 11/04/2023 1800   LABSPEC 1.020 11/04/2023 1800   PHURINE 5.0 11/04/2023 1800   GLUCOSEU NEGATIVE 11/04/2023 1800   HGBUR SMALL (A) 11/04/2023 1800   BILIRUBINUR NEGATIVE 11/04/2023 1800   KETONESUR 5 (A) 11/04/2023 1800   PROTEINUR 100 (A) 11/04/2023 1800   NITRITE NEGATIVE 11/04/2023 1800   LEUKOCYTESUR NEGATIVE 11/04/2023 1800   Sepsis Labs Recent Labs  Lab 12/10/23 0449 12/11/23 0535 12/13/23 0347 12/14/23 0415  WBC 9.1 6.3 4.9 5.1   Microbiology No results found for this or any previous visit (from the past 240 hours). Imaging CT ABDOMEN PELVIS W CONTRAST Result Date: 12/09/2023 CLINICAL DATA:  Epigastric and suprapubic abdominal pain EXAM: CT ABDOMEN AND PELVIS WITH CONTRAST TECHNIQUE: Multidetector CT imaging of the abdomen and pelvis was performed using the standard protocol following bolus administration of intravenous contrast. RADIATION DOSE REDUCTION: This exam was performed according to the departmental dose-optimization program which includes automated exposure control, adjustment of the mA and/or kV according to patient size and/or use of iterative reconstruction technique. CONTRAST:  OMNIPAQUE  IOHEXOL  300 MG/ML  SOLN COMPARISON:  10/14/2023 FINDINGS: Lower chest: Aortic valve prosthesis visible on the scout image. Mild descending thoracic aortic atheromatous vascular calcification.  Mild dependent atelectasis in the left lower lobe. Hepatobiliary: Cholecystectomy.  Otherwise unremarkable. Pancreas: Abnormal peripancreatic stranding compatible with acute pancreatitis. No findings of pancreatic abscess, mass, or pseudocyst. Spleen: Unremarkable Adrenals/Urinary Tract: Mild retained fetal lobulation of the kidneys. Adrenal glands normal. Urinary bladder unremarkable. Stomach/Bowel: Submucosal fat deposition in the wall of the ascending, transverse, and descending colon and also in the appendix. This can have a weak association with inflammatory bowel disease. Although the appendix is stable from 10/14/2023 and this appearance, the appendix is also stably thickened at 0.9 cm in diameter, without surrounding periappendiceal inflammatory findings. Vascular/Lymphatic: There continues to be swirling of the mesentery in the right pelvis as on image 51 series 2, without dilated bowel or specific complicating feature observed. Patent splenic vein. Atherosclerosis is present, including aortoiliac atherosclerotic disease. Reproductive: Uterus absent. Other: No supplemental non-categorized findings. Musculoskeletal: Degenerative facet arthropathy in the lower lumbar spine. Small supraumbilical hernia contains adipose tissue on image 77 series 6. IMPRESSION: 1. Acute pancreatitis. No findings of pancreatic abscess, mass, or pseudocyst. 2. Submucosal fat deposition in the wall of the ascending, transverse, and descending colon and also in the appendix. This can have a weak association with inflammatory bowel disease. The appendix is also stably thickened at 0.9 cm in diameter, without surrounding periappendiceal inflammatory findings. I suspect that this thickening is probably due to the submucosal fat deposition, with chronic appendicitis as a less likely differential diagnostic consideration. 3. Small supraumbilical hernia contains adipose tissue. 4. There is some swirling of mesenteric vessels in the right  pelvis, but no bowel dilatation or bowel complication is observed. 5. Degenerative facet arthropathy in the lower lumbar spine. 6.  Aortic Atherosclerosis (ICD10-I70.0). Electronically Signed   By: Freida Jes M.D.   On: 12/09/2023 17:44      Time coordinating discharge: over 30 minutes  SIGNED:  Shaima Sardinas DO [facility]

## 2023-12-14 NOTE — TOC Transition Note (Signed)
 Transition of Care [facility]) - Discharge Note   Patient Details  Name: Sarah Sosa MRN: [id number] Date of Birth: Mar 09, 1981  [facility] CM/SW Contact:  Elsie Halo, RN Phone Number: 12/14/2023, 12:34 PM   Clinical Narrative:     Patient is medically clear for dc to home. Patient refused SA resources. No other TOC needs identified.    Final next level of care: Home/Self Care Barriers to Discharge: Barriers Resolved   Patient Goals and CMS Choice            Discharge Placement                       Discharge Plan and Services Additional resources added to the After Visit Summary for                                       Social Drivers of Health (SDOH) Interventions SDOH Screenings   Food Insecurity: No Food Insecurity (12/09/2023)  Housing: Low Risk  (12/09/2023)  Recent Concern: Housing - High Risk (09/15/2023)   Received from [facility]  Transportation Needs: No Transportation Needs (12/09/2023)  Utilities: Not At Risk (12/09/2023)  Financial Resource Strain: High Risk (07/19/2023)   Received from [facility]  Social Connections: Moderately Isolated (12/09/2023)  Stress: No Stress Concern Present (08/31/2023)   Received from [facility]  Tobacco Use: High Risk (12/09/2023)     Readmission Risk Interventions    11/08/2023    2:16 PM 10/24/2023   11:27 AM 09/20/2023   10:58 AM  Readmission Risk Prevention Plan  Transportation Screening Complete Complete Complete  Medication Review Oceanographer) -- Complete Complete  PCP or Specialist appointment within 3-5 days of discharge Complete Complete Complete  HRI or Home Care Consult Not Complete  Complete  HRI or Home Care Consult Pt Refusal Comments Not applicable    SW Recovery Care/Counseling Consult  Complete Complete  Palliative Care Screening Not Applicable Not Applicable Not Applicable  Skilled Nursing Facility Not  Applicable Not Applicable Not Applicable

## 2023-12-14 NOTE — Progress Notes (Signed)
 PHARMACY - ANTICOAGULATION CONSULT NOTE  Pharmacy Consult for warfarin Indication: Aortic valve replacement with On-X valve  Allergies  Allergen Reactions   Latex Anaphylaxis, Hives and Itching    Other reaction(s): Other (See Comments) SKIN BURNING & PEELING    Nsaids Other (See Comments)    Serve ulcers; stomach aches, GERD.  Other Reaction(s): Other (See Comments)   Tomato Anaphylaxis, Hives and Itching   Gabapentin  Palpitations    Has heart condition; also makes jittery   Metformin      Other Reaction(s): Abdominal Pain  Causes pancreatitis to worsen   Lisinopril      Other Reaction(s): Angioedema   Tramadol Itching and Hives   Duloxetine Anxiety    Makes her jittery   Patient Measurements: Height: 5\' 6"  (167.6 cm) Weight: 110.9 kg (244 lb 7.8 oz) IBW/kg (Calculated) : 59.3 HEPARIN  DW (KG): 85.7  Vital Signs: Temp: 98 F (36.7 C) (05/30 0500) BP: 92/53 (05/30 0500) Pulse Rate: 71 (05/30 0500)  Labs: Recent Labs    12/12/23 0219 12/13/23 0347 12/14/23 0415  HGB  --  12.6 12.4  HCT  --  37.7 37.2  PLT  --  309 309  LABPROT 17.2* 19.3* 21.6*  INR 1.4* 1.6* 1.9*  CREATININE 0.62 0.73 0.96   Estimated Creatinine Clearance: 96.3 mL/min (by C-G formula based on SCr of 0.96 mg/dL).  Medical History: Past Medical History:  Diagnosis Date   Arthritis    Asthma    Chronic pain disorder 03/19/2018   GERD (gastroesophageal reflux disease)    Headache    Heart murmur    History of trichomoniasis 03/19/2018   Hypertension    Intractable nausea and vomiting    Pancreatitis    PID (pelvic inflammatory disease) 02/14/2018   Uterine leiomyoma 03/19/2018   PTA Warfarin Regimen 3.75 mg daily  Last dose warfarin before admission: 3.75 mg on 5/25 @ 1400  Assessment: 43 y/o female presenting with abdominal pain, nausea, and vomiting. PMH significant for asthma, chronic pancreatitis, chronic pain syndrome, AVR on chronic anticoagulation (warfarin), T2DM, HTN, and  HFpEF. Pharmacy has been consulted to resume and manage home warfarin.   [facility] notes: Of note, patient last seen in anticoagulation clinic at [facility] on 10/19/23, where INR was found to be 5.1 (above goal of 1.5-2.0). At that time, provider adjusted warfarin regimen to 3.75 mg daily (TWD 26.25 mg). Per chart review, previous warfarin regimen prior to office visit on 4/4 was 7.5 mg on Thursdays and 3.75 mg all other days (TWD 30 mg). At this time, regimen was adjusted to 3.75 mg daily.    Goal of Therapy:  INR 1.5 - 2.0, confirmed with anticoagulation clinic notes from [facility] Monitor platelets by anticoagulation protocol: Yes  INR  Date  INR Assess        Warfarin Dose 12/09/23 1.5 Therapeutic       Patient took dose before admission  12/10/23 1.5 Therapeutic         3.75 mg 12/11/23            1.4       Subtherapeutic   3.75 mg  12/12/23 1.4       Subtherapeutic 3.75 mg  12/13/23 1.6       Therapeutic  4 mg 12/14/23 1.9    3 mg   Plan:  INR is trending up but at goal. Will give warfarin 3 mg x 1 tonight. Pt was given a higher dose of warfarin 5/29, predict INR may continue to trend  up. Recommend restarting home dose of 3.75 mg daily, if INR stabilizes within goal. Daily INR. CBC at least every 3 days. Based on [facility] note, pt was supratherapeutic on 3.75 mg daily, may be reasonable to decrease daily dose to 3 mg daily if INR continues to trend up on the 3.75 mg daily regimen. (~20% of home regimen). Weight loss probably contributing to the sensitivity to warfarin. Pt was started on ozempic in feb and has had two hospitalizations for acute pancreatitis (end of March and end of May).   Thank you for involving pharmacy in this patient's care.   Diesel Lina S Lillyann Ahart, PharmD 12/14/2023 7:15 AM

## 2023-12-14 NOTE — Plan of Care (Signed)

## 2023-12-14 NOTE — Consult Note (Signed)
 PHARMACY CONSULT NOTE - FOLLOW UP  Pharmacy Consult for Electrolyte Monitoring and Replacement   Recent Labs: Potassium (mmol/L)  Date Value  12/14/2023 3.8   Magnesium  (mg/dL)  Date Value  16/04/9603 1.9   Calcium  (mg/dL)  Date Value  54/03/8118 9.3   Albumin (g/dL)  Date Value  14/78/2956 3.7   Phosphorus (mg/dL)  Date Value  21/30/8657 2.6   Sodium (mmol/L)  Date Value  12/14/2023 135   Assessment: SB is a 43 yo female who presented to the ED with abdominal pain, nausea and vomiting for the last 3 days. They have a history significant for chronic pancreatitis, opioid and alcohol use disorder. Pharmacy has been consulted to manage this patient's electrolytes while admitted. Scr stable.   Pt is on spironolactone  and losartan , which can increase potassium. Holding lasix .   Goal of Therapy:  Electrolytes WNL   Plan:  Mg 2 g IV x 1.  F/u with AM labs.   Thank you for allowing pharmacy to participate in this patient's care.   Johnanthony Wilden S Coreyon Nicotra, PharmD 12/14/2023 7:10 AM

## 2024-01-25 ENCOUNTER — Inpatient Hospital Stay
Admission: EM | Admit: 2024-01-25 | Discharge: 2024-01-30 | DRG: 439 | Disposition: A | Discharge status: Home / Self Care | Attending: Obstetrics and Gynecology | Admitting: Obstetrics and Gynecology

## 2024-01-25 ENCOUNTER — Emergency Department

## 2024-01-25 ENCOUNTER — Other Ambulatory Visit: Payer: Self-pay

## 2024-01-25 DIAGNOSIS — Z87891 Personal history of nicotine dependence: Secondary | ICD-10-CM

## 2024-01-25 DIAGNOSIS — E66812 Obesity, class 2: Secondary | ICD-10-CM | POA: Diagnosis present

## 2024-01-25 DIAGNOSIS — Z8249 Family history of ischemic heart disease and other diseases of the circulatory system: Secondary | ICD-10-CM | POA: Diagnosis not present

## 2024-01-25 DIAGNOSIS — Z7985 Long-term (current) use of injectable non-insulin antidiabetic drugs: Secondary | ICD-10-CM

## 2024-01-25 DIAGNOSIS — Z7901 Long term (current) use of anticoagulants: Secondary | ICD-10-CM

## 2024-01-25 DIAGNOSIS — G8929 Other chronic pain: Secondary | ICD-10-CM | POA: Diagnosis present

## 2024-01-25 DIAGNOSIS — E860 Dehydration: Secondary | ICD-10-CM | POA: Diagnosis present

## 2024-01-25 DIAGNOSIS — Z7984 Long term (current) use of oral hypoglycemic drugs: Secondary | ICD-10-CM | POA: Diagnosis not present

## 2024-01-25 DIAGNOSIS — I5032 Chronic diastolic (congestive) heart failure: Secondary | ICD-10-CM | POA: Diagnosis present

## 2024-01-25 DIAGNOSIS — Z90710 Acquired absence of both cervix and uterus: Secondary | ICD-10-CM

## 2024-01-25 DIAGNOSIS — E876 Hypokalemia: Secondary | ICD-10-CM | POA: Diagnosis present

## 2024-01-25 DIAGNOSIS — Z6839 Body mass index (BMI) 39.0-39.9, adult: Secondary | ICD-10-CM

## 2024-01-25 DIAGNOSIS — Z79899 Other long term (current) drug therapy: Secondary | ICD-10-CM | POA: Diagnosis not present

## 2024-01-25 DIAGNOSIS — Z79891 Long term (current) use of opiate analgesic: Secondary | ICD-10-CM

## 2024-01-25 DIAGNOSIS — E785 Hyperlipidemia, unspecified: Secondary | ICD-10-CM | POA: Diagnosis present

## 2024-01-25 DIAGNOSIS — I110 Hypertensive heart disease with heart failure: Secondary | ICD-10-CM | POA: Diagnosis present

## 2024-01-25 DIAGNOSIS — K852 Alcohol induced acute pancreatitis without necrosis or infection: Principal | ICD-10-CM | POA: Diagnosis present

## 2024-01-25 DIAGNOSIS — K861 Other chronic pancreatitis: Secondary | ICD-10-CM | POA: Diagnosis present

## 2024-01-25 DIAGNOSIS — E119 Type 2 diabetes mellitus without complications: Secondary | ICD-10-CM | POA: Diagnosis present

## 2024-01-25 DIAGNOSIS — J45909 Unspecified asthma, uncomplicated: Secondary | ICD-10-CM | POA: Diagnosis present

## 2024-01-25 DIAGNOSIS — Z8261 Family history of arthritis: Secondary | ICD-10-CM | POA: Diagnosis not present

## 2024-01-25 DIAGNOSIS — Z6841 Body Mass Index (BMI) 40.0 and over, adult: Secondary | ICD-10-CM

## 2024-01-25 DIAGNOSIS — Z952 Presence of prosthetic heart valve: Secondary | ICD-10-CM

## 2024-01-25 DIAGNOSIS — K858 Other acute pancreatitis without necrosis or infection: Secondary | ICD-10-CM | POA: Diagnosis not present

## 2024-01-25 DIAGNOSIS — R112 Nausea with vomiting, unspecified: Secondary | ICD-10-CM

## 2024-01-25 DIAGNOSIS — K859 Acute pancreatitis without necrosis or infection, unspecified: Principal | ICD-10-CM | POA: Diagnosis present

## 2024-01-25 LAB — COMPREHENSIVE METABOLIC PANEL WITH GFR
ALT: 10 U/L (ref 0–44)
AST: 19 U/L (ref 15–41)
Albumin: 3.9 g/dL (ref 3.5–5.0)
Alkaline Phosphatase: 75 U/L (ref 38–126)
Anion gap: 13 (ref 5–15)
BUN: 7 mg/dL (ref 6–20)
CO2: 25 mmol/L (ref 22–32)
Calcium: 9 mg/dL (ref 8.9–10.3)
Chloride: 97 mmol/L — ABNORMAL LOW (ref 98–111)
Creatinine, Ser: 0.73 mg/dL (ref 0.44–1.00)
GFR, Estimated: >60 mL/min (ref >60)
Glucose, Bld: 116 mg/dL — ABNORMAL HIGH (ref 70–99)
Potassium: 2.9 mmol/L — ABNORMAL LOW (ref 3.5–5.1)
Sodium: 135 mmol/L (ref 135–145)
Total Bilirubin: 1.4 mg/dL — ABNORMAL HIGH (ref 0.0–1.2)
Total Protein: 7.2 g/dL (ref 6.5–8.1)

## 2024-01-25 LAB — CBC
HCT: 41.5 % (ref 36.0–46.0)
Hemoglobin: 13.7 g/dL (ref 12.0–15.0)
MCH: 30.7 pg (ref 26.0–34.0)
MCHC: 33 g/dL (ref 30.0–36.0)
MCV: 93 fL (ref 80.0–100.0)
Platelets: 268 K/uL (ref 150–400)
RBC: 4.46 MIL/uL (ref 3.87–5.11)
RDW: 13.2 % (ref 11.5–15.5)
WBC: 6.2 K/uL (ref 4.0–10.5)
nRBC: 0 % (ref 0.0–0.2)

## 2024-01-25 LAB — URINALYSIS, ROUTINE W REFLEX MICROSCOPIC
Bilirubin Urine: NEGATIVE
Glucose, UA: NEGATIVE mg/dL
Hgb urine dipstick: NEGATIVE
Ketones, ur: 80 mg/dL — AB
Leukocytes,Ua: NEGATIVE
Nitrite: NEGATIVE
Protein, ur: 100 mg/dL — AB
Specific Gravity, Urine: >1.046 — ABNORMAL HIGH (ref 1.005–1.030)
pH: 6 (ref 5.0–8.0)

## 2024-01-25 LAB — PROTIME-INR
INR: 2.1 — ABNORMAL HIGH (ref 0.8–1.2)
Prothrombin Time: 24.9 s — ABNORMAL HIGH (ref 11.4–15.2)

## 2024-01-25 LAB — PHOSPHORUS: Phosphorus: 2.2 mg/dL — ABNORMAL LOW (ref 2.5–4.6)

## 2024-01-25 LAB — LIPASE, BLOOD: Lipase: 116 U/L — ABNORMAL HIGH (ref 11–51)

## 2024-01-25 LAB — MAGNESIUM: Magnesium: 1.3 mg/dL — ABNORMAL LOW (ref 1.7–2.4)

## 2024-01-25 LAB — CBG MONITORING, ED: Glucose-Capillary: 128 mg/dL — ABNORMAL HIGH (ref 70–99)

## 2024-01-25 MED ORDER — INSULIN ASPART 100 UNIT/ML IJ SOLN
0.0000 [IU] | Freq: Three times a day (TID) | INTRAMUSCULAR | Status: DC
  Administered 2024-01-25: 2 [IU] via SUBCUTANEOUS
  Filled 2024-01-25: qty 1

## 2024-01-25 MED ORDER — MORPHINE SULFATE (PF) 4 MG/ML IV SOLN
4.0000 mg | Freq: Once | INTRAVENOUS | Status: AC
  Administered 2024-01-25: 4 mg via INTRAVENOUS
  Filled 2024-01-25: qty 1

## 2024-01-25 MED ORDER — PANTOPRAZOLE SODIUM 40 MG PO TBEC
40.0000 mg | DELAYED_RELEASE_TABLET | Freq: Every day | ORAL | Status: DC
  Administered 2024-01-25 – 2024-01-30 (×6): 40 mg via ORAL
  Filled 2024-01-25 (×6): qty 1

## 2024-01-25 MED ORDER — SPIRONOLACTONE 25 MG PO TABS
25.0000 mg | ORAL_TABLET | Freq: Every day | ORAL | Status: DC
  Administered 2024-01-25 – 2024-01-30 (×6): 25 mg via ORAL
  Filled 2024-01-25 (×6): qty 1

## 2024-01-25 MED ORDER — ONDANSETRON HCL 4 MG/2ML IJ SOLN
4.0000 mg | Freq: Once | INTRAMUSCULAR | Status: AC
  Administered 2024-01-25: 4 mg via INTRAVENOUS
  Filled 2024-01-25: qty 2

## 2024-01-25 MED ORDER — SODIUM CHLORIDE 0.9 % IV BOLUS
1000.0000 mL | Freq: Once | INTRAVENOUS | Status: AC
  Administered 2024-01-25: 1000 mL via INTRAVENOUS

## 2024-01-25 MED ORDER — AMLODIPINE BESYLATE 10 MG PO TABS
10.0000 mg | ORAL_TABLET | Freq: Every day | ORAL | Status: DC
  Administered 2024-01-25 – 2024-01-30 (×6): 10 mg via ORAL
  Filled 2024-01-25 (×3): qty 1
  Filled 2024-01-25 (×2): qty 2
  Filled 2024-01-25: qty 1

## 2024-01-25 MED ORDER — FENTANYL CITRATE PF 50 MCG/ML IJ SOSY
100.0000 ug | PREFILLED_SYRINGE | Freq: Once | INTRAMUSCULAR | Status: AC
  Administered 2024-01-25: 100 ug via INTRAVENOUS
  Filled 2024-01-25: qty 2

## 2024-01-25 MED ORDER — BUPRENORPHINE HCL 2 MG SL SUBL
8.0000 mg | SUBLINGUAL_TABLET | Freq: Every day | SUBLINGUAL | Status: DC
  Administered 2024-01-25: 8 mg via SUBLINGUAL
  Filled 2024-01-25 (×2): qty 1

## 2024-01-25 MED ORDER — LOSARTAN POTASSIUM 50 MG PO TABS
50.0000 mg | ORAL_TABLET | Freq: Every day | ORAL | Status: DC
Start: 2024-01-25 — End: 2024-01-31
  Administered 2024-01-25 – 2024-01-30 (×6): 50 mg via ORAL
  Filled 2024-01-25 (×6): qty 1

## 2024-01-25 MED ORDER — HYDROMORPHONE HCL 1 MG/ML IJ SOLN
1.0000 mg | INTRAMUSCULAR | Status: DC | PRN
  Administered 2024-01-25 – 2024-01-27 (×17): 1 mg via INTRAVENOUS
  Filled 2024-01-25 (×17): qty 1

## 2024-01-25 MED ORDER — IOHEXOL 300 MG/ML  SOLN
80.0000 mL | Freq: Once | INTRAMUSCULAR | Status: AC | PRN
  Administered 2024-01-25: 80 mL via INTRAVENOUS

## 2024-01-25 MED ORDER — METFORMIN HCL 500 MG PO TABS
500.0000 mg | ORAL_TABLET | Freq: Every day | ORAL | Status: DC
  Administered 2024-01-28 – 2024-01-30 (×3): 500 mg via ORAL
  Filled 2024-01-25 (×3): qty 1

## 2024-01-25 MED ORDER — SODIUM CHLORIDE 0.9 % IV SOLN: INTRAVENOUS | Status: AC

## 2024-01-25 MED ORDER — WARFARIN SODIUM 3 MG PO TABS
3.0000 mg | ORAL_TABLET | Freq: Once | ORAL | Status: AC
  Administered 2024-01-25: 3 mg via ORAL
  Filled 2024-01-25 (×2): qty 1

## 2024-01-25 MED ORDER — ONDANSETRON HCL 4 MG/2ML IJ SOLN
4.0000 mg | Freq: Four times a day (QID) | INTRAMUSCULAR | Status: DC | PRN
  Administered 2024-01-26 – 2024-01-29 (×5): 4 mg via INTRAVENOUS
  Filled 2024-01-25 (×5): qty 2

## 2024-01-25 MED ORDER — ATORVASTATIN CALCIUM 20 MG PO TABS
40.0000 mg | ORAL_TABLET | Freq: Every day | ORAL | Status: DC
  Administered 2024-01-25 – 2024-01-30 (×6): 40 mg via ORAL
  Filled 2024-01-25 (×6): qty 2

## 2024-01-25 MED ORDER — POTASSIUM CHLORIDE 20 MEQ PO PACK
40.0000 meq | PACK | Freq: Once | ORAL | Status: AC
  Administered 2024-01-25: 40 meq via ORAL
  Filled 2024-01-25: qty 2

## 2024-01-25 MED ORDER — TRAZODONE HCL 50 MG PO TABS
100.0000 mg | ORAL_TABLET | Freq: Every day | ORAL | Status: DC
  Administered 2024-01-25 – 2024-01-29 (×5): 100 mg via ORAL
  Filled 2024-01-25: qty 1
  Filled 2024-01-25 (×4): qty 2

## 2024-01-25 MED ORDER — AMITRIPTYLINE HCL 10 MG PO TABS: 10.0000 mg | ORAL_TABLET | Freq: Three times a day (TID) | ORAL | Status: DC | PRN

## 2024-01-25 MED ORDER — WARFARIN - PHARMACIST DOSING INPATIENT
Freq: Every day | Status: DC
  Filled 2024-01-25: qty 1

## 2024-01-25 MED ORDER — ALBUTEROL SULFATE (2.5 MG/3ML) 0.083% IN NEBU: 3.0000 mL | INHALATION_SOLUTION | Freq: Four times a day (QID) | RESPIRATORY_TRACT | Status: DC | PRN

## 2024-01-25 MED ORDER — OXYCODONE HCL 5 MG PO TABS
5.0000 mg | ORAL_TABLET | ORAL | Status: DC | PRN
  Administered 2024-01-25 – 2024-01-28 (×4): 5 mg via ORAL
  Filled 2024-01-25 (×4): qty 1

## 2024-01-25 MED ORDER — POTASSIUM CHLORIDE CRYS ER 20 MEQ PO TBCR
40.0000 meq | EXTENDED_RELEASE_TABLET | ORAL | Status: DC
  Administered 2024-01-25 (×2): 40 meq via ORAL
  Filled 2024-01-25: qty 2

## 2024-01-25 NOTE — ED Triage Notes (Signed)
 Pt to ED via POV from home. Pt reports UTI like symptoms that started last week and this Wednesday started to have more symptoms concerning for pancreatitis. Pt reports LUQ pain, back pain, N/V, and painful urination. Pt is on antibiotic.

## 2024-01-25 NOTE — Progress Notes (Signed)
 PHARMACY - ANTICOAGULATION CONSULT NOTE   Pharmacy Consult for warfarin Indication: Aortic valve replacement with On-X valve   Allergies       Allergies  Allergen Reactions   Latex Anaphylaxis, Hives and Itching      Other reaction(s): Other (See Comments) SKIN BURNING & PEELING     Nsaids Other (See Comments)      Serve ulcers; stomach aches, GERD.   Other Reaction(s): Other (See Comments)   Tomato Anaphylaxis, Hives and Itching   Gabapentin  Palpitations      Has heart condition; also makes jittery   Metformin         Other Reaction(s): Abdominal Pain   Causes pancreatitis to worsen   Lisinopril         Other Reaction(s): Angioedema   Tramadol Itching and Hives   Duloxetine Anxiety      Makes her jittery      Patient Measurements: Height: 5' 6 (167.6 cm) Weight: 110.9 kg (244 lb 7.8 oz) IBW/kg (Calculated) : 59.3 HEPARIN  DW (KG): 85.7   Vital Signs: Temp: 98 F (36.7 C) (05/30 0500) BP: 92/53 (05/30 0500) Pulse Rate: 71 (05/30 0500)   Labs: Recent Labs (last 2 labs)       Recent Labs    12/12/23 0219 12/13/23 0347 12/14/23 0415  HGB  --  12.6 12.4  HCT  --  37.7 37.2  PLT  --  309 309  LABPROT 17.2* 19.3* 21.6*  INR 1.4* 1.6* 1.9*  CREATININE 0.62 0.73 0.96      Estimated Creatinine Clearance: 96.3 mL/min (by C-G formula based on SCr of 0.96 mg/dL).   Medical History:     Past Medical History:  Diagnosis Date   Arthritis     Asthma     Chronic pain disorder 03/19/2018   GERD (gastroesophageal reflux disease)     Headache     Heart murmur     History of trichomoniasis 03/19/2018   Hypertension     Intractable nausea and vomiting     Pancreatitis     PID (pelvic inflammatory disease) 02/14/2018   Uterine leiomyoma 03/19/2018        PTA Warfarin Regimen 3.75 mg daily (TWD 26.25 mg) Last dose warfarin before admission: 3.75 mg on 7/10 at 2100   Assessment: 43 y.o. female presenting with epigastric pain, radiating to back, which worsens  with meal intake. PMH includes asthma, chronic pancreatitis, chronic pain, AVR (On-X valve) on chronic anticoagulation with warfarin, T2DM, HTN, and HFpEF. Imaging reveals mild stranding in peripancreatic fat. Patient has not had an Ozempic dose for 3 weeks. She takes warfarin in setting of AVR. Pharmacy has been consulted to resume and manage home warfarin.  Weight loss from Ozempic possibly contributing to increased warfarin sensitivity, however patient also recently took Bactrim for UTI which can enhance warfarin's anticoagulant effects.  [facility] notes: Recent supratherapeutic INRs for which regimen was adjusted from 30mg  total weekly dose to 26.25mg  (3.75mg  daily) in early April. Office visit with PCP on 6/27 revealed INR of 2.6 for which patient instructed to hold that night's dose.   Goal of Therapy:  INR 1.5 - 2.0, confirmed with anticoagulation clinic notes from [facility] Monitor platelets by anticoagulation protocol: Yes  Date INR Warfarin Dose 0711 2.1 3mg    Plan: INR slightly supratherapeutic Give warfarin 3mg  PO x 1 tonight Daily INR CBC at least every 7 days on warfarin Thank you for involving pharmacy in this patient's care.  Will M. Lenon, PharmD Clinical Pharmacist 01/25/2024 8:35  PM

## 2024-01-25 NOTE — Progress Notes (Incomplete)
 PHARMACY - ANTICOAGULATION CONSULT NOTE   Pharmacy Consult for warfarin Indication: Aortic valve replacement with On-X valve   Allergies       Allergies  Allergen Reactions   Latex Anaphylaxis, Hives and Itching      Other reaction(s): Other (See Comments) SKIN BURNING & PEELING     Nsaids Other (See Comments)      Serve ulcers; stomach aches, GERD.   Other Reaction(s): Other (See Comments)   Tomato Anaphylaxis, Hives and Itching   Gabapentin  Palpitations      Has heart condition; also makes jittery   Metformin         Other Reaction(s): Abdominal Pain   Causes pancreatitis to worsen   Lisinopril         Other Reaction(s): Angioedema   Tramadol Itching and Hives   Duloxetine Anxiety      Makes her jittery      Patient Measurements: Height: 5' 6 (167.6 cm) Weight: 110.9 kg (244 lb 7.8 oz) IBW/kg (Calculated) : 59.3 HEPARIN  DW (KG): 85.7   Vital Signs: Temp: 98 F (36.7 C) (05/30 0500) BP: 92/53 (05/30 0500) Pulse Rate: 71 (05/30 0500)   Labs: Recent Labs (last 2 labs)       Recent Labs    12/12/23 0219 12/13/23 0347 12/14/23 0415  HGB  --  12.6 12.4  HCT  --  37.7 37.2  PLT  --  309 309  LABPROT 17.2* 19.3* 21.6*  INR 1.4* 1.6* 1.9*  CREATININE 0.62 0.73 0.96      Estimated Creatinine Clearance: 96.3 mL/min (by C-G formula based on SCr of 0.96 mg/dL).   Medical History:     Past Medical History:  Diagnosis Date   Arthritis     Asthma     Chronic pain disorder 03/19/2018   GERD (gastroesophageal reflux disease)     Headache     Heart murmur     History of trichomoniasis 03/19/2018   Hypertension     Intractable nausea and vomiting     Pancreatitis     PID (pelvic inflammatory disease) 02/14/2018   Uterine leiomyoma 03/19/2018        PTA Warfarin Regimen 3.75 mg daily  Last dose warfarin before admission: 3.75 mg on 7/10 at 2100   Assessment: 43 y/o female presenting with abdominal pain, nausea, and vomiting. PMH significant for asthma,  chronic pancreatitis, chronic pain syndrome, AVR on chronic anticoagulation (warfarin), T2DM, HTN, and HFpEF. Pharmacy has been consulted to resume and manage home warfarin.     [facility] notes: Of note, patient last seen in anticoagulation clinic at [facility] on 10/19/23, where INR was found to be 5.1 (above goal of 1.5-2.0). At that time, provider adjusted warfarin regimen to 3.75 mg daily (TWD 26.25 mg). Per chart review, previous warfarin regimen prior to office visit on 4/4 was 7.5 mg on Thursdays and 3.75 mg all other days (TWD 30 mg). At this time, regimen was adjusted to 3.75 mg daily.      Goal of Therapy:  INR 1.5 - 2.0, confirmed with anticoagulation clinic notes from [facility] Monitor platelets by anticoagulation protocol: Yes   Plan:  INR is trending up but at goal. Will give warfarin 3 mg x 1 tonight. Pt was given a higher dose of warfarin 5/29, predict INR may continue to trend up. Recommend restarting home dose of 3.75 mg daily, if INR stabilizes within goal. Daily INR. CBC at least every 3 days. Based on [facility] note, pt was supratherapeutic on 3.75 mg  daily, may be reasonable to decrease daily dose to 3 mg daily if INR continues to trend up on the 3.75 mg daily regimen. (~20% of home regimen). Weight loss probably contributing to the sensitivity to warfarin. Pt was started on ozempic in feb and has had two hospitalizations for acute pancreatitis (end of March and end of May).    Thank you for involving pharmacy in this patient's care.

## 2024-01-25 NOTE — H&P (Signed)
 History and Physical    Sarah Sosa:[id number] DOB: 25-Apr-1981 DOA: 01/25/2024  PCP: Clinic, [facility] (Confirm with patient/family/NH records and if not entered, this has to be entered at [facility] point of entry) Patient coming from: Home  I have personally briefly reviewed patient's old medical records in [facility]  Chief Complaint: Belly hurts  HPI: Sarah Sosa is a 43 y.o. female with medical history significant of chronic recurrent pancreatitis, opioid and alcohol abuse, aortic insufficiency and ascending aortic aneurysm status post aortic valve replacement and aneurysm repair on chronic warfarin therapy since September 2023, HTN, IIDM, morbid obesity, presented with worsening of abdominal pain.  Symptoms started 2 days ago, patient started develop epigastric abdominal pain, sharp-like radiating to the back and gradually getting worse, worsening with meal intake.  Patient reported that she still uses alcohol  occasionally and she blames her flareup of her pancreatitis mostly to Ozempic, the last dose was 3 weeks ago.  ED Course: Afebrile, nontachycardic blood pressure 150/100 O2 saturation 100% on room air.  CT abdomen pelvis showed signs of pancreatitis, lipase 115, K2.9 bicarb 25 BUN 7 creatinine 0.7 AST 19 ALT 11 total bilirubin 1.4  Review of Systems: As per HPI otherwise 14 point review of systems negative.    Past Medical History:  Diagnosis Date   Arthritis    Asthma    Chronic pain disorder 03/19/2018   GERD (gastroesophageal reflux disease)    Headache    Heart murmur    History of trichomoniasis 03/19/2018   Hypertension    Intractable nausea and vomiting    Pancreatitis    PID (pelvic inflammatory disease) 02/14/2018   Uterine leiomyoma 03/19/2018    Past Surgical History:  Procedure Laterality Date   ABDOMINAL HYSTERECTOMY     AORTIC VALVE REPLACEMENT  03/2022   CHOLECYSTECTOMY     DENTAL SURGERY     HYSTERECTOMY ABDOMINAL WITH  SALPINGECTOMY Bilateral 04/15/2018   Procedure: HYSTERECTOMY ABDOMINAL WITH BILATERAL SALPINGECTOMY;  Surgeon: Connell Davies, MD;  Location: [facility] ORS;  Service: Gynecology;  Laterality: Bilateral;   kenn surgery Bilateral    KNEE ARTHROSCOPY Left 2012, 2013     reports that she has been smoking cigarettes. She has a 11 pack-year smoking history. She has never used smokeless tobacco. She reports that she does not currently use alcohol. She reports current drug use. Drug: Marijuana.  Allergies  Allergen Reactions   Latex Anaphylaxis, Hives and Itching    Other reaction(s): Other (See Comments) SKIN BURNING & PEELING    Nsaids Other (See Comments)    Serve ulcers; stomach aches, GERD.  Other Reaction(s): Other (See Comments)   Tomato Anaphylaxis, Hives and Itching   Gabapentin  Palpitations    Has heart condition; also makes jittery   Metformin      Other Reaction(s): Abdominal Pain  Causes pancreatitis to worsen   Lisinopril      Other Reaction(s): Angioedema   Tramadol Itching and Hives   Duloxetine Anxiety    Makes her jittery    Family History  Problem Relation Age of Onset   Fibroids Mother    Hypertension Mother    Arthritis Mother      Prior to Admission medications   Medication Sig Start Date End Date Taking? Authorizing Provider  albuterol  (VENTOLIN  HFA) 108 (90 Base) MCG/ACT inhaler Inhale 1-2 puffs into the lungs every 6 (six) hours as needed. 07/26/23  Yes [provider]  amitriptyline  (ELAVIL ) 10 MG tablet Take 10 mg by mouth 3 (three) times  daily as needed for sleep (or pain). 11/02/22  Yes [provider]  amLODipine  (NORVASC ) 10 MG tablet Take 1 tablet (10 mg total) by mouth daily. 07/19/23  Yes Darci Pore, MD  atorvastatin  (LIPITOR) 40 MG tablet Take 40 mg by mouth daily. 10/17/22  Yes [provider]  buprenorphine  (SUBUTEX ) 8 MG SUBL SL tablet Place 8 mg under the tongue. Place 1 tablet (8 mg total) under the tongue 3 (three)  times daily 01/11/24  Yes [provider]  furosemide  (LASIX ) 40 MG tablet Take 0.5 tablets (20 mg total) by mouth daily as needed. Home med. 10/26/23  Yes Awanda City, MD  losartan  (COZAAR ) 50 MG tablet Take 1 tablet (50 mg total) by mouth daily. 07/19/23  Yes Darci Pore, MD  metFORMIN  (GLUCOPHAGE ) 500 MG tablet Take 500 mg by mouth every morning. 10/17/22  Yes [provider]  naloxone  (NARCAN ) nasal spray 4 mg/0.1 mL Place 1 spray into the nose once as needed (to reverse overdose). 02/24/23  Yes [provider]  omeprazole (PRILOSEC) 40 MG capsule Take 40 mg by mouth daily. 02/23/23  Yes [provider]  ondansetron  (ZOFRAN ) 4 MG tablet Take 1 tablet (4 mg total) by mouth daily as needed for nausea or vomiting. 08/13/23 08/12/24 Yes Sreenath, Sudheer B, MD  oxyCODONE  (OXY IR/ROXICODONE ) 5 MG immediate release tablet Take 1 tablet (5 mg total) by mouth every 4 (four) hours as needed for moderate pain (pain score 4-6) or severe pain (pain score 7-10). 12/14/23  Yes Alexander, Natalie, DO  OZEMPIC, 1 MG/DOSE, 4 MG/3ML SOPN Inject 1 mg into the skin once a week. 01/08/24  Yes [provider]  spironolactone  (ALDACTONE ) 25 MG tablet Take 25 mg by mouth daily. 02/03/22  Yes [provider]  traZODone  (DESYREL ) 50 MG tablet Take 100 mg by mouth at bedtime. 09/07/21  Yes [provider]  warfarin (COUMADIN ) 7.5 MG tablet Take 0.5 tablets (3.75 mg total) by mouth daily. 12/14/23  Yes Alexander, Natalie, DO  buprenorphine -naloxone  (SUBOXONE ) 8-2 mg SUBL SL tablet Place 3 tablets under the tongue daily. Patient not taking: Reported on 01/25/2024 11/10/23   Barbarann Nest, MD  lipase/protease/amylase (CREON ) 36000 UNITS CPEP capsule Take 1 capsule (36,000 Units total) by mouth 3 (three) times daily with meals. Patient not taking: Reported on 01/25/2024 08/13/23   Jhonny Calvin NOVAK, MD  magnesium  chloride (SLOW-MAG) 64 MG TBEC SR tablet Take 1 tablet (64  mg total) by mouth daily. Patient not taking: Reported on 01/25/2024 12/14/23   Marsa Edelman, DO    Physical Exam: Vitals:   01/25/24 1207 01/25/24 1402 01/25/24 1539  BP: (!) 156/106  (!) 168/108  Pulse: 83  75  Resp: 20  18  Temp: 98.6 F (37 C)  98.1 F (36.7 C)  TempSrc: Oral  Oral  SpO2: 98%  100%  Weight:  110.9 kg   Height:  5' 6 (1.676 m)     Constitutional: NAD, calm, comfortable Vitals:   01/25/24 1207 01/25/24 1402 01/25/24 1539  BP: (!) 156/106  (!) 168/108  Pulse: 83  75  Resp: 20  18  Temp: 98.6 F (37 C)  98.1 F (36.7 C)  TempSrc: Oral  Oral  SpO2: 98%  100%  Weight:  110.9 kg   Height:  5' 6 (1.676 m)    Eyes: PERRL, lids and conjunctivae normal ENMT: Mucous membranes are moist. Posterior pharynx clear of any exudate or lesions.Normal dentition.  Neck: normal, supple, no masses, no thyromegaly  Respiratory: clear to auscultation bilaterally, no wheezing, no crackles. Normal respiratory effort. No accessory muscle use.  Cardiovascular: Regular rate and rhythm, no murmurs / rubs / gallops. No extremity edema. 2+ pedal pulses. No carotid bruits.  Abdomen: Severe tenderness on epigastric area, no rebound no guarding, no masses palpated. No hepatosplenomegaly. Bowel sounds positive.  Musculoskeletal: no clubbing / cyanosis. No joint deformity upper and lower extremities. Good ROM, no contractures. Normal muscle tone.  Skin: no rashes, lesions, ulcers. No induration Neurologic: CN 2-12 grossly intact. Sensation intact, DTR normal. Strength 5/5 in all 4.  Psychiatric: Normal judgment and insight. Alert and oriented x 3. Normal mood.    Labs on Admission: I have personally reviewed following labs and imaging studies  CBC: Recent Labs  Lab 01/25/24 1208  WBC 6.2  HGB 13.7  HCT 41.5  MCV 93.0  PLT 268   Basic Metabolic Panel: Recent Labs  Lab 01/25/24 1208  NA 135  K 2.9*  CL 97*  CO2 25  GLUCOSE 116*  BUN 7  CREATININE 0.73  CALCIUM  9.0    GFR: Estimated Creatinine Clearance: 115.5 mL/min (by C-G formula based on SCr of 0.73 mg/dL). Liver Function Tests: Recent Labs  Lab 01/25/24 1208  AST 19  ALT 10  ALKPHOS 75  BILITOT 1.4*  PROT 7.2  ALBUMIN 3.9   Recent Labs  Lab 01/25/24 1208  LIPASE 116*   No results for input(s): AMMONIA in the last 168 hours. Coagulation Profile: No results for input(s): INR, PROTIME in the last 168 hours. Cardiac Enzymes: No results for input(s): CKTOTAL, CKMB, CKMBINDEX, TROPONINI in the last 168 hours. BNP (last 3 results) No results for input(s): PROBNP in the last 8760 hours. HbA1C: No results for input(s): HGBA1C in the last 72 hours. CBG: No results for input(s): GLUCAP in the last 168 hours. Lipid Profile: No results for input(s): CHOL, HDL, LDLCALC, TRIG, CHOLHDL, LDLDIRECT in the last 72 hours. Thyroid Function Tests: No results for input(s): TSH, T4TOTAL, FREET4, T3FREE, THYROIDAB in the last 72 hours. Anemia Panel: No results for input(s): VITAMINB12, FOLATE, FERRITIN, TIBC, IRON, RETICCTPCT in the last 72 hours. Urine analysis:    Component Value Date/Time   COLORURINE YELLOW (A) 01/25/2024 1530   APPEARANCEUR HAZY (A) 01/25/2024 1530   LABSPEC >1.046 (H) 01/25/2024 1530   PHURINE 6.0 01/25/2024 1530   GLUCOSEU NEGATIVE 01/25/2024 1530   HGBUR NEGATIVE 01/25/2024 1530   BILIRUBINUR NEGATIVE 01/25/2024 1530   KETONESUR 80 (A) 01/25/2024 1530   PROTEINUR 100 (A) 01/25/2024 1530   NITRITE NEGATIVE 01/25/2024 1530   LEUKOCYTESUR NEGATIVE 01/25/2024 1530    Radiological Exams on Admission: CT ABDOMEN PELVIS W CONTRAST Result Date: 01/25/2024 CLINICAL DATA:  Abdominal pain, history of pancreatitis EXAM: CT ABDOMEN AND PELVIS WITH CONTRAST TECHNIQUE: Multidetector CT imaging of the abdomen and pelvis was performed using the standard protocol following bolus administration of intravenous contrast. RADIATION DOSE  REDUCTION: This exam was performed according to the departmental dose-optimization program which includes automated exposure control, adjustment of the mA and/or kV according to patient size and/or use of iterative reconstruction technique. CONTRAST:  80mL OMNIPAQUE  IOHEXOL  300 MG/ML  SOLN COMPARISON:  CT of the abdomen pelvis performed Dec 09, 2023 FINDINGS: Lower chest: No acute abnormality. Hepatobiliary: Focal fatty infiltration of the anterior liver. Gallbladder is surgically absent. Pancreas: A focal calcification is present within the pancreatic body. Possible mild stranding surrounding the pancreas. No focal fluid collection is appreciated. Spleen: Normal in size without focal abnormality. Adrenals/Urinary  Tract: No hydronephrosis or significant nephrolithiasis. Urinary bladder is partially decompressed. Stomach/Bowel: No dilated loops of bowel are appreciated. Stable appearance of the appendix. Vascular/Lymphatic: Mildly prominent lymph nodes are present which may be reactive. Stable appearance of the mesenteric fat. Reproductive: Uterus and bilateral adnexa are unremarkable. Other: Small fat containing umbilical hernia. Musculoskeletal: No acute or significant osseous findings. IMPRESSION: 1. Mild stranding in the peripancreatic fat which can be observed in the setting of pancreatitis. There is no evidence of focal fluid collection, pseudocyst formation, or abscess formation. Electronically Signed   By: Maude Naegeli M.D.   On: 01/25/2024 15:22    EKG: Independently reviewed.  Sinus rhythm, no acute ST changes.  Assessment/Plan Active Problems:   Acute on chronic pancreatitis (HCC)   Pancreatitis  (please populate well all problems here in Problem List. (For example, if patient is on BP meds at home and you resume or decide to hold them, it is a problem that needs to be her. Same for CAD, COPD, HLD and so on)  Acute on chronic pancreatitis - Educated patient regarding abstinence from alcohol to  avoid further future pancreatitis flareup.  Patient however was unimpressed and continued complaining Ozempic.  In this case, I recommended patient to discontinue Ozempic and she agreed. - Conservative management, Dilaudid  alternated with oxycodone  - Continue home dose of Subutex  - N.p.o. tonight, IV fluid and as needed Zofran   Status post aortic valve replacement Chronic systemic anticoagulation - Consult pharmacy for warfarin redosing  HTN Chronic HFpEF - Euvolemic to mild dehydration - Continue home BP meds regimen including amlodipine  losartan  and spironolactone   IIDM - Discontinue GLP-1 - Continue metformin   Morbid obesity - BMI= 39 - Not a candidate for GLP-1 anymore given history of recurrent pancreatitis.  Consider outpatient bariatric evaluation  DVT prophylaxis: Coumadin  Code Status: Full code Family Communication: None at bedside Disposition Plan: Patient sick with pancreatitis requiring IV pain meds and p.o. hydration, expect more than 2 midnight hospital stay Consults called: None Admission status: MedSurg admission   Cort ONEIDA Mana MD [facility] Pager [phone]  01/25/2024, 4:45 PM

## 2024-01-25 NOTE — ED Provider Notes (Signed)
 [facility] Provider Note    Event Date/Time   First MD Initiated Contact with Patient 01/25/24 1255     (approximate)   History   Abdominal Pain   HPI Sarah Sosa is a 43 y.o. female with history of pancreatitis, chronic opioid use, DM2, HTN, HLD presenting today for upper abdominal pain.  Patient states she was treated for UTI-like symptoms approximately 1 week ago with Bactrim.  Was having improvement in those symptoms but in the past 2 days to start develop symptoms of upper abdominal pain associated with nausea and vomiting.  Also having diarrhea.  Reports resolution of dysuria symptoms.  States this feels similar to her prior pancreatitis episodes.  Chart review: Patient has had multiple admissions in the past for pancreatitis.     Physical Exam   Triage Vital Signs: ED Triage Vitals [01/25/24 1207]  Encounter Vitals Group     BP (!) 156/106     Girls Systolic BP Percentile      Girls Diastolic BP Percentile      Boys Systolic BP Percentile      Boys Diastolic BP Percentile      Pulse Rate 83     Resp 20     Temp 98.6 F (37 C)     Temp Source Oral     SpO2 98 %     Weight      Height      Head Circumference      Peak Flow      Pain Score 10     Pain Loc      Pain Education      Exclude from Growth Chart     Most recent vital signs: Vitals:   01/25/24 1207 01/25/24 1539  BP: (!) 156/106 (!) 168/108  Pulse: 83 75  Resp: 20 18  Temp: 98.6 F (37 C) 98.1 F (36.7 C)  SpO2: 98% 100%   Physical Exam: I have reviewed the vital signs and nursing notes. General: Awake, alert, no acute distress.  Nontoxic appearing. Head:  Atraumatic, normocephalic.   ENT:  EOM intact, PERRL. Oral mucosa is pink and moist with no lesions. Neck: Neck is supple with full range of motion, No meningeal signs. Cardiovascular:  RRR, No murmurs. Peripheral pulses palpable and equal bilaterally. Respiratory:  Symmetrical chest wall expansion.  No  rhonchi, rales, or wheezes.  Good air movement throughout.  No use of accessory muscles.   Musculoskeletal:  No cyanosis or edema. Moving extremities with full ROM Abdomen:  Soft, tenderness most prominent in the epigastric region but also mild in the right upper quadrant left upper quadrant, nondistended. Neuro:  GCS 15, moving all four extremities, interacting appropriately. Speech clear. Psych:  Calm, appropriate.   Skin:  Warm, dry, no rash.    ED Results / Procedures / Treatments   Labs (all labs ordered are listed, but only abnormal results are displayed) Labs Reviewed  LIPASE, BLOOD - Abnormal; Notable for the following components:      Result Value   Lipase 116 (*)    All other components within normal limits  COMPREHENSIVE METABOLIC PANEL WITH GFR - Abnormal; Notable for the following components:   Potassium 2.9 (*)    Chloride 97 (*)    Glucose, Bld 116 (*)    Total Bilirubin 1.4 (*)    All other components within normal limits  URINALYSIS, ROUTINE W REFLEX MICROSCOPIC - Abnormal; Notable for the following components:   Color, Urine YELLOW (*)  APPearance HAZY (*)    Specific Gravity, Urine >1.046 (*)    Ketones, ur 80 (*)    Protein, ur 100 (*)    Bacteria, UA RARE (*)    All other components within normal limits  CBC     EKG My EKG interpretation: Rate of 77, normal sinus rhythm, normal axis, normal intervals.  No acute ST elevations or depressions   RADIOLOGY Independently interpreted CT abdomen/pelvis showing evidence of pancreatitis   PROCEDURES:  Critical Care performed: No  Procedures   MEDICATIONS ORDERED IN ED: Medications  sodium chloride  0.9 % bolus 1,000 mL (1,000 mLs Intravenous New Bag/Given 01/25/24 1354)  ondansetron  (ZOFRAN ) injection 4 mg (4 mg Intravenous Given 01/25/24 1356)  morphine  (PF) 4 MG/ML injection 4 mg (4 mg Intravenous Given 01/25/24 1356)  fentaNYL  (SUBLIMAZE ) injection 100 mcg (100 mcg Intravenous Given 01/25/24 1441)   iohexol  (OMNIPAQUE ) 300 MG/ML solution 80 mL (80 mLs Intravenous Contrast Given 01/25/24 1445)     IMPRESSION / MDM / ASSESSMENT AND PLAN / ED COURSE  I reviewed the triage vital signs and the nursing notes.                              Differential diagnosis includes, but is not limited to, pancreatitis, pancreatic abscess, colitis, gastritis, enteritis, amongst other intra-abdominal etiologies  Patient's presentation is most consistent with exacerbation of chronic illness.  Patient is a 43 year old female with history of pancreatitis presenting for similar symptoms today with upper abdominal pain, nausea, vomiting, and diarrhea.  Uncomfortable on exam but vital signs stable.  Laboratory workup with unremarkable CBC.  CMP with mild hypokalemia likely from vomiting and slightly elevated T. bili at 1.4 although has had gallbladder out before.  Lipase elevated at 116 and likely most indicative of her current symptoms.  Will get CT abdomen/pelvis to rule out further pathology such as abscess or pseudocyst while giving 1 L fluids, Zofran , and morphine .  CT abdomen/pelvis shows evidence of pancreatitis.  Patient requiring additional pain medicine with  uncontrolled pain for pancreatitis.  Given ongoing symptoms, will admit to hospitalist for pancreatitis.  Clinical Course as of 01/25/24 1559  Fri Jan 25, 2024  1444 Reassessed.  Ongoing pain and will add fentanyl  to her regimen [DW]  1535 CT ABDOMEN PELVIS W CONTRAST Positive for pancreatitis [DW]    Clinical Course User Index [DW] Malvina Alm DASEN, MD     FINAL CLINICAL IMPRESSION(S) / ED DIAGNOSES   Final diagnoses:  Acute pancreatitis, unspecified complication status, unspecified pancreatitis type  Nausea and vomiting, unspecified vomiting type     Rx / DC Orders   ED Discharge Orders     None        Note:  This document was prepared using Dragon voice recognition software and may include unintentional dictation errors.    Malvina Alm DASEN, MD 01/25/24 [phone]

## 2024-01-25 NOTE — ED Notes (Signed)
 Patient aware that urine sample is needed; unable to provide at this time.

## 2024-01-26 DIAGNOSIS — K858 Other acute pancreatitis without necrosis or infection: Secondary | ICD-10-CM

## 2024-01-26 LAB — PROTIME-INR
INR: 2.1 — ABNORMAL HIGH (ref 0.8–1.2)
Prothrombin Time: 24.6 s — ABNORMAL HIGH (ref 11.4–15.2)

## 2024-01-26 LAB — BASIC METABOLIC PANEL WITH GFR
Anion gap: 11 (ref 5–15)
BUN: <5 mg/dL — ABNORMAL LOW (ref 6–20)
CO2: 24 mmol/L (ref 22–32)
Calcium: 8.6 mg/dL — ABNORMAL LOW (ref 8.9–10.3)
Chloride: 102 mmol/L (ref 98–111)
Creatinine, Ser: 0.59 mg/dL (ref 0.44–1.00)
GFR, Estimated: >60 mL/min (ref >60)
Glucose, Bld: 84 mg/dL (ref 70–99)
Potassium: 3.2 mmol/L — ABNORMAL LOW (ref 3.5–5.1)
Sodium: 137 mmol/L (ref 135–145)

## 2024-01-26 LAB — GLUCOSE, CAPILLARY
Glucose-Capillary: 106 mg/dL — ABNORMAL HIGH (ref 70–99)
Glucose-Capillary: 74 mg/dL (ref 70–99)

## 2024-01-26 LAB — CBG MONITORING, ED
Glucose-Capillary: 75 mg/dL (ref 70–99)
Glucose-Capillary: 78 mg/dL (ref 70–99)

## 2024-01-26 LAB — LIPASE, BLOOD: Lipase: 61 U/L — ABNORMAL HIGH (ref 11–51)

## 2024-01-26 MED ORDER — WARFARIN SODIUM 3 MG PO TABS
3.0000 mg | ORAL_TABLET | Freq: Once | ORAL | Status: AC
  Administered 2024-01-26: 3 mg via ORAL
  Filled 2024-01-26 (×3): qty 1

## 2024-01-26 MED ORDER — BUPRENORPHINE HCL 2 MG SL SUBL
8.0000 mg | SUBLINGUAL_TABLET | Freq: Three times a day (TID) | SUBLINGUAL | Status: DC
  Filled 2024-01-26 (×6): qty 4

## 2024-01-26 NOTE — Progress Notes (Signed)
 Pt refusing bed in lowest position.  This RN explained safety concerns, but pt expressed her wishes to leave the bed elevated because it's easier on her knees.  All four bed rails are up to prevent pt from falling out of bed.

## 2024-01-26 NOTE — Plan of Care (Signed)
  Problem: Coping: Goal: Ability to adjust to condition or change in health will improve Outcome: Progressing   Problem: Fluid Volume: Goal: Ability to maintain a balanced intake and output will improve Outcome: Progressing   Problem: Health Behavior/Discharge Planning: Goal: Ability to identify and utilize available resources and services will improve Outcome: Progressing Goal: Ability to manage health-related needs will improve Outcome: Progressing   Problem: Metabolic: Goal: Ability to maintain appropriate glucose levels will improve Outcome: Progressing   Problem: Nutritional: Goal: Maintenance of adequate nutrition will improve Outcome: Progressing   Problem: Skin Integrity: Goal: Risk for impaired skin integrity will decrease Outcome: Progressing   Problem: Education: Goal: Knowledge of General Education information will improve Description: Including pain rating scale, medication(s)/side effects and non-pharmacologic comfort measures Outcome: Progressing   Problem: Nutrition: Goal: Adequate nutrition will be maintained Outcome: Progressing   Problem: Coping: Goal: Level of anxiety will decrease Outcome: Progressing   Problem: Elimination: Goal: Will not experience complications related to bowel motility Outcome: Progressing   Problem: Pain Managment: Goal: General experience of comfort will improve and/or be controlled Outcome: Progressing

## 2024-01-26 NOTE — Progress Notes (Signed)
 PROGRESS NOTE    Sarah Sosa   FMW:[id number] DOB: 06-14-1981  DOA: 01/25/2024 Date of Service: 01/26/24 which is hospital day 1  PCP: Clinic, [facility] course / significant events:   HPI: Sarah Sosa is a 43 y.o. female with medical history significant of chronic recurrent pancreatitis, opioid and alcohol abuse, aortic insufficiency and ascending aortic aneurysm status post aortic valve replacement and aneurysm repair on chronic warfarin therapy since September 2023, HTN, IIDM, morbid obesity, presented with worsening of abdominal pain x2 days - epigastric abdominal pain, sharp-like radiating to the back and gradually getting worse, worsening with meal intake.  Patient reported that she still uses alcohol  occasionally and she blames her flareup of her pancreatitis mostly to Ozempic, the last dose was 3 weeks ago although was instructed to STOP this medication on most recent discharge.   07/11: admitted for pancreatitis, LIpase 116 07/12: lipase down to 61, minimal po intake    Consultants:  none  Procedures/Surgeries: none      ASSESSMENT & PLAN:   Acute on chronic pancreatitis Chronic pain / opiate use  NO EtOH - pt counseled STOP Ozempic per last DC summary - pt counseled Pain management:  Home regimen based on last note:  buprenorphine  tablets 8 mg three times daily. oxycodone  5 mg for breakthrough pain, 45 tablets monthly.  Here:  buprenorphine  tablets 8 mg three times daily. Oxycodone  5 mg po q4h pen mod-severe pain Dilaudid  1 mg q2h prn breakthrough pain, stop time tomorrow morning  Advance diet as tolerated   Status post aortic valve replacement Chronic systemic anticoagulation Consult pharmacy for warfarin redosing   HTN Chronic HFpEF Euvolemic to mild dehydration Continue home BP meds regimen including amlodipine  losartan  and spironolactone    IIDM Discontinue GLP-1 as previously directed  Continue metformin  SSI here       Class 2 obesity based on BMI: Body mass index is 39.46 kg/m.Sarah Sosa Significantly low or high BMI is associated with higher medical risk.  Underweight - under 18  overweight - 25 to 29 obese - 30 or more Class 1 obesity: BMI of 30.0 to 34 Class 2 obesity: BMI of 35.0 to 39 Class 3 obesity: BMI of 40.0 to 49 Super Morbid Obesity: BMI 50-59 Super-super Morbid Obesity: BMI 60+ Healthy nutrition and physical activity advised as adjunct to other disease management and risk reduction treatments    DVT prophylaxis: coumadin  IV fluids: continue NS 125 mL/h continuous IV fluids thru today Nutrition: advancing as tolerated Central lines / other devices: none  Code Status: FULL CODE ACP documentation reviewed: none on file in [facility]CA  TOC needs: none at this time Medical barriers to dispo: IV fluids, pain control. Expected medical readiness for discharge pending po intake and wean to home pain regimen.              Subjective / Brief ROS:  Patient reports persistent abd pain Denies CP/SOB.  Pain controlled.  Denies new weakness.  Tolerating sips liquids.  Reports no concerns w/ urination/defecation.   Family Communication: none at this time     Objective Findings:  Vitals:   01/25/24 2301 01/26/24 0405 01/26/24 0719 01/26/24 1138  BP: (!) 153/83 (!) 162/86 (!) 155/69 127/80  Pulse: 72 73 75 75  Resp: 18 18 18 17   Temp: 97.6 F (36.4 C) 98.6 F (37 C) 98.2 F (36.8 C) 98.4 F (36.9 C)  TempSrc: Oral Oral Oral Oral  SpO2: 97% 96% 92% 95%  Weight:  Height:       No intake or output data in the 24 hours ending 01/26/24 1409 Filed Weights   01/25/24 1402  Weight: 110.9 kg    Examination:  Physical Exam Constitutional:      General: She is not in acute distress.    Appearance: She is not ill-appearing.  Cardiovascular:     Rate and Rhythm: Normal rate and regular rhythm.     Heart sounds: Murmur heard.  Pulmonary:     Effort: Pulmonary effort is  normal.     Breath sounds: Normal breath sounds.  Abdominal:     General: Bowel sounds are normal.     Tenderness: There is abdominal tenderness (epigastric).  Skin:    General: Skin is warm and dry.  Neurological:     General: No focal deficit present.     Mental Status: She is alert and oriented to person, place, and time.  Psychiatric:        Mood and Affect: Mood normal.        Behavior: Behavior normal.          Scheduled Medications:   amLODipine   10 mg Oral Daily   atorvastatin   40 mg Oral Daily   buprenorphine   8 mg Sublingual TID   insulin  aspart  0-15 Units Subcutaneous TID WC   losartan   50 mg Oral Daily   [START ON 01/28/2024] metFORMIN   500 mg Oral Q breakfast   pantoprazole   40 mg Oral Daily   spironolactone   25 mg Oral Daily   traZODone   100 mg Oral QHS   warfarin  3 mg Oral ONCE-1600   Warfarin - Pharmacist Dosing Inpatient   Does not apply q1600    Continuous Infusions:  sodium chloride  125 mL/hr at 01/26/24 0403    PRN Medications:  albuterol , amitriptyline , HYDROmorphone  (DILAUDID ) injection, ondansetron  (ZOFRAN ) IV, oxyCODONE   Antimicrobials from admission:  Anti-infectives (From admission, onward)    None           Data Reviewed:  I have personally reviewed the following...  CBC: Recent Labs  Lab 01/25/24 1208  WBC 6.2  HGB 13.7  HCT 41.5  MCV 93.0  PLT 268   Basic Metabolic Panel: Recent Labs  Lab 01/25/24 1200 01/25/24 1208 01/26/24 0948  NA  --  135 137  K  --  2.9* 3.2*  CL  --  97* 102  CO2  --  25 24  GLUCOSE  --  116* 84  BUN  --  7 <5*  CREATININE  --  0.73 0.59  CALCIUM   --  9.0 8.6*  MG 1.3*  --   --   PHOS 2.2*  --   --    GFR: Estimated Creatinine Clearance: 115.5 mL/min (by C-G formula based on SCr of 0.59 mg/dL). Liver Function Tests: Recent Labs  Lab 01/25/24 1208  AST 19  ALT 10  ALKPHOS 75  BILITOT 1.4*  PROT 7.2  ALBUMIN 3.9   Recent Labs  Lab 01/25/24 1208 01/26/24 0948  LIPASE  116* 61*   No results for input(s): AMMONIA in the last 168 hours. Coagulation Profile: Recent Labs  Lab 01/25/24 1954 01/26/24 0948  INR 2.1* 2.1*   Cardiac Enzymes: No results for input(s): CKTOTAL, CKMB, CKMBINDEX, TROPONINI in the last 168 hours. BNP (last 3 results) No results for input(s): PROBNP in the last 8760 hours. HbA1C: No results for input(s): HGBA1C in the last 72 hours. CBG: Recent Labs  Lab 01/25/24 1728 01/26/24  0803 01/26/24 1140  GLUCAP 128* 75 78   Lipid Profile: No results for input(s): CHOL, HDL, LDLCALC, TRIG, CHOLHDL, LDLDIRECT in the last 72 hours. Thyroid Function Tests: No results for input(s): TSH, T4TOTAL, FREET4, T3FREE, THYROIDAB in the last 72 hours. Anemia Panel: No results for input(s): VITAMINB12, FOLATE, FERRITIN, TIBC, IRON, RETICCTPCT in the last 72 hours. Most Recent Urinalysis On File:     Component Value Date/Time   COLORURINE YELLOW (A) 01/25/2024 1530   APPEARANCEUR HAZY (A) 01/25/2024 1530   LABSPEC >1.046 (H) 01/25/2024 1530   PHURINE 6.0 01/25/2024 1530   GLUCOSEU NEGATIVE 01/25/2024 1530   HGBUR NEGATIVE 01/25/2024 1530   BILIRUBINUR NEGATIVE 01/25/2024 1530   KETONESUR 80 (A) 01/25/2024 1530   PROTEINUR 100 (A) 01/25/2024 1530   NITRITE NEGATIVE 01/25/2024 1530   LEUKOCYTESUR NEGATIVE 01/25/2024 1530   Sepsis Labs: @LABRCNTIP (procalcitonin:4,lacticidven:4) Microbiology: No results found for this or any previous visit (from the past 240 hours).    Radiology Studies last 3 days: CT ABDOMEN PELVIS W CONTRAST Result Date: 01/25/2024 CLINICAL DATA:  Abdominal pain, history of pancreatitis EXAM: CT ABDOMEN AND PELVIS WITH CONTRAST TECHNIQUE: Multidetector CT imaging of the abdomen and pelvis was performed using the standard protocol following bolus administration of intravenous contrast. RADIATION DOSE REDUCTION: This exam was performed according to the departmental  dose-optimization program which includes automated exposure control, adjustment of the mA and/or kV according to patient size and/or use of iterative reconstruction technique. CONTRAST:  80mL OMNIPAQUE  IOHEXOL  300 MG/ML  SOLN COMPARISON:  CT of the abdomen pelvis performed Dec 09, 2023 FINDINGS: Lower chest: No acute abnormality. Hepatobiliary: Focal fatty infiltration of the anterior liver. Gallbladder is surgically absent. Pancreas: A focal calcification is present within the pancreatic body. Possible mild stranding surrounding the pancreas. No focal fluid collection is appreciated. Spleen: Normal in size without focal abnormality. Adrenals/Urinary Tract: No hydronephrosis or significant nephrolithiasis. Urinary bladder is partially decompressed. Stomach/Bowel: No dilated loops of bowel are appreciated. Stable appearance of the appendix. Vascular/Lymphatic: Mildly prominent lymph nodes are present which may be reactive. Stable appearance of the mesenteric fat. Reproductive: Uterus and bilateral adnexa are unremarkable. Other: Small fat containing umbilical hernia. Musculoskeletal: No acute or significant osseous findings. IMPRESSION: 1. Mild stranding in the peripancreatic fat which can be observed in the setting of pancreatitis. There is no evidence of focal fluid collection, pseudocyst formation, or abscess formation. Electronically Signed   By: Maude Naegeli M.D.   On: 01/25/2024 15:22         Rebecka Oelkers, DO [facility] 01/26/2024, 2:09 PM    Dictation software may have been used to generate the above note. Typos may occur and escape review in typed/dictated notes. Please contact Dr Marsa directly for clarity if needed.  Staff may message me via secure chat in Epic  but this may not receive an immediate response,  please page me for urgent matters!  If 7PM-7AM, please contact night coverage [email or web address]

## 2024-01-26 NOTE — Progress Notes (Signed)
 PHARMACY - ANTICOAGULATION CONSULT NOTE   Pharmacy Consult for warfarin Indication: Aortic valve replacement with On-X valve   Allergies       Allergies  Allergen Reactions   Latex Anaphylaxis, Hives and Itching      Other reaction(s): Other (See Comments) SKIN BURNING & PEELING     Nsaids Other (See Comments)      Serve ulcers; stomach aches, GERD.   Other Reaction(s): Other (See Comments)   Tomato Anaphylaxis, Hives and Itching   Gabapentin  Palpitations      Has heart condition; also makes jittery   Metformin         Other Reaction(s): Abdominal Pain   Causes pancreatitis to worsen   Lisinopril         Other Reaction(s): Angioedema   Tramadol Itching and Hives   Duloxetine Anxiety      Makes her jittery      Patient Measurements: Height: 5' 6 (167.6 cm) Weight: 110.9 kg (244 lb 7.8 oz) IBW/kg (Calculated) : 59.3 HEPARIN  DW (KG): 85.7   Vital Signs: Temp: 98 F (36.7 C) (05/30 0500) BP: 92/53 (05/30 0500) Pulse Rate: 71 (05/30 0500)   Labs: Recent Labs (last 2 labs)       Recent Labs    12/12/23 0219 12/13/23 0347 12/14/23 0415  HGB  --  12.6 12.4  HCT  --  37.7 37.2  PLT  --  309 309  LABPROT 17.2* 19.3* 21.6*  INR 1.4* 1.6* 1.9*  CREATININE 0.62 0.73 0.96      Estimated Creatinine Clearance: 96.3 mL/min (by C-G formula based on SCr of 0.96 mg/dL).   Medical History:     Past Medical History:  Diagnosis Date   Arthritis     Asthma     Chronic pain disorder 03/19/2018   GERD (gastroesophageal reflux disease)     Headache     Heart murmur     History of trichomoniasis 03/19/2018   Hypertension     Intractable nausea and vomiting     Pancreatitis     PID (pelvic inflammatory disease) 02/14/2018   Uterine leiomyoma 03/19/2018        PTA Warfarin Regimen 3.75 mg daily (TWD 26.25 mg) Last dose warfarin before admission: 3.75 mg on 7/10 at 2100   Assessment: 43 y.o. female presenting with epigastric pain, radiating to back, which worsens  with meal intake. PMH includes asthma, chronic pancreatitis, chronic pain, AVR (On-X valve) on chronic anticoagulation with warfarin, T2DM, HTN, and HFpEF. Imaging reveals mild stranding in peripancreatic fat. Patient has not had an Ozempic dose for 3 weeks. She takes warfarin in setting of AVR. Pharmacy has been consulted to resume and manage home warfarin.  Weight loss from Ozempic possibly contributing to increased warfarin sensitivity, however patient also recently took Bactrim for UTI which can enhance warfarin's anticoagulant effects.  [facility] notes: Recent supratherapeutic INRs for which regimen was adjusted from 30mg  total weekly dose to 26.25mg  (3.75mg  daily) in early April. Office visit with PCP on 6/27 revealed INR of 2.6 for which patient instructed to hold that night's dose.   Goal of Therapy:  INR 1.5 - 2.0, confirmed with anticoagulation clinic notes from [facility] Monitor platelets by anticoagulation protocol: Yes  Date INR Warfarin Dose 0711 2.1 3mg  0712 2.1 3mg  ordered   Plan: INR slightly supratherapeutic Give warfarin 3mg  PO x 1 tonight Daily INR CBC at least every 7 days on warfarin Thank you for involving pharmacy in this patient's care.  Areatha Kalata A Keane Martelli, PharmD  Clinical Pharmacist 01/26/2024 11:04 AM

## 2024-01-26 NOTE — Hospital Course (Signed)
 Hospital course / significant events:   HPI: Sarah Sosa is a 43 y.o. female with medical history significant of chronic recurrent pancreatitis, opioid and alcohol abuse, aortic insufficiency and ascending aortic aneurysm status post aortic valve replacement and aneurysm repair on chronic warfarin therapy since September 2023, HTN, IIDM, morbid obesity, presented with worsening of abdominal pain x2 days - epigastric abdominal pain, sharp-like radiating to the back and gradually getting worse, worsening with meal intake.  Patient reported that she still uses alcohol  occasionally and she blames her flareup of her pancreatitis mostly to Ozempic, the last dose was 3 weeks ago although was instructed to STOP this medication on most recent discharge.   07/11: admitted for pancreatitis, LIpase 116 07/12: lipase down to 61, minimal po intake. Dilaudid  1 mg q2h prn 07/13: still minimal po / liquids only. Pt encouraged to try soft diet or full liquids. Dilaudid  reduced to 0.5 mg q2h prn 07/14: lipase normal. Dilaudid  to 0.5 mg q4h for breakthrough only, oxycodone  to 10 mg q6 07/15: still not eating solids   Consultants:  none  Procedures/Surgeries: none      ASSESSMENT & PLAN:   Acute on chronic pancreatitis Chronic pain / opiate use  NO EtOH - pt counseled STOP Ozempic per last DC summary - pt counseled Pain management:  Home regimen based on last note:  buprenorphine  tablets 8 mg three times daily. oxycodone  5 mg for breakthrough pain, 45 tablets monthly.  Here:  buprenorphine  tablets 8 mg three times daily. Oxycodone  10 mg po q6h pen mod-severe pain Dilaudid  0.5 mg q2h prn breakthrough pain Advance diet as tolerated   Status post aortic valve replacement Chronic systemic anticoagulation Consult pharmacy for warfarin dosing   HTN Chronic HFpEF Euvolemic to mild dehydration Continue home amlodipine  losartan  and spironolactone    IIDM Discontinue GLP-1 as previously directed   Pt declining Glc checks and insulin     Hypokalemia Hypomagnesemia Replace as needed Monitor BMP    Class 2 obesity based on BMI: Body mass index is 39.46 kg/m.SABRA Significantly low or high BMI is associated with higher medical risk.  Underweight - under 18  overweight - 25 to 29 obese - 30 or more Class 1 obesity: BMI of 30.0 to 34 Class 2 obesity: BMI of 35.0 to 39 Class 3 obesity: BMI of 40.0 to 49 Super Morbid Obesity: BMI 50-59 Super-super Morbid Obesity: BMI 60+ Healthy nutrition and physical activity advised as adjunct to other disease management and risk reduction treatments    DVT prophylaxis: coumadin  IV fluids: continue NS 125 mL/h continuous IV fluids thru today Nutrition: advancing as tolerated Central lines / other devices: none  Code Status: FULL CODE ACP documentation reviewed: none on file in [facility]CA  TOC needs: none at this time Medical barriers to dispo:  pain control. Expected medical readiness for discharge pending po intake and wean to home pain regimen. Hopefully tomorrow

## 2024-01-27 DIAGNOSIS — K858 Other acute pancreatitis without necrosis or infection: Secondary | ICD-10-CM | POA: Diagnosis not present

## 2024-01-27 LAB — BASIC METABOLIC PANEL WITH GFR
Anion gap: 8 (ref 5–15)
BUN: <5 mg/dL — ABNORMAL LOW (ref 6–20)
CO2: 24 mmol/L (ref 22–32)
Calcium: 8.7 mg/dL — ABNORMAL LOW (ref 8.9–10.3)
Chloride: 103 mmol/L (ref 98–111)
Creatinine, Ser: 0.34 mg/dL — ABNORMAL LOW (ref 0.44–1.00)
GFR, Estimated: >60 mL/min (ref >60)
Glucose, Bld: 87 mg/dL (ref 70–99)
Potassium: 3.1 mmol/L — ABNORMAL LOW (ref 3.5–5.1)
Sodium: 135 mmol/L (ref 135–145)

## 2024-01-27 LAB — PROTIME-INR
INR: 2.2 — ABNORMAL HIGH (ref 0.8–1.2)
Prothrombin Time: 25.6 s — ABNORMAL HIGH (ref 11.4–15.2)

## 2024-01-27 LAB — GLUCOSE, CAPILLARY
Glucose-Capillary: 108 mg/dL — ABNORMAL HIGH (ref 70–99)
Glucose-Capillary: 90 mg/dL (ref 70–99)
Glucose-Capillary: 118 mg/dL — ABNORMAL HIGH (ref 70–99)
Glucose-Capillary: 102 mg/dL — ABNORMAL HIGH (ref 70–99)

## 2024-01-27 LAB — LIPASE, BLOOD: Lipase: 63 U/L — ABNORMAL HIGH (ref 11–51)

## 2024-01-27 MED ORDER — POTASSIUM CHLORIDE CRYS ER 20 MEQ PO TBCR
40.0000 meq | EXTENDED_RELEASE_TABLET | Freq: Once | ORAL | Status: DC
Start: 2024-01-27 — End: 2024-01-27
  Filled 2024-01-27: qty 2

## 2024-01-27 MED ORDER — HYDROMORPHONE HCL 1 MG/ML IJ SOLN
0.5000 mg | INTRAMUSCULAR | Status: DC | PRN
  Administered 2024-01-27 – 2024-01-28 (×10): 0.5 mg via INTRAVENOUS
  Filled 2024-01-27 (×10): qty 0.5

## 2024-01-27 MED ORDER — POTASSIUM CHLORIDE 20 MEQ PO PACK
40.0000 meq | PACK | Freq: Once | ORAL | Status: AC
  Administered 2024-01-27: 40 meq via ORAL
  Filled 2024-01-27: qty 2

## 2024-01-27 MED ORDER — MAGNESIUM LACTATE 84 MG (7MEQ) PO TBCR
84.0000 mg | EXTENDED_RELEASE_TABLET | Freq: Once | ORAL | Status: DC
  Filled 2024-01-27: qty 1

## 2024-01-27 MED ORDER — WARFARIN SODIUM 2 MG PO TABS
2.0000 mg | ORAL_TABLET | Freq: Once | ORAL | Status: AC
  Administered 2024-01-27: 2 mg via ORAL
  Filled 2024-01-27: qty 1

## 2024-01-27 NOTE — Progress Notes (Addendum)
 PROGRESS NOTE    Sarah Sosa   FMW:[id number] DOB: 1981-04-23  DOA: 01/25/2024 Date of Service: 01/27/24 which is hospital day 2  PCP: Clinic, [facility] course / significant events:   HPI: Sarah Sosa is a 43 y.o. female with medical history significant of chronic recurrent pancreatitis, opioid and alcohol abuse, aortic insufficiency and ascending aortic aneurysm status post aortic valve replacement and aneurysm repair on chronic warfarin therapy since September 2023, HTN, IIDM, morbid obesity, presented with worsening of abdominal pain x2 days - epigastric abdominal pain, sharp-like radiating to the back and gradually getting worse, worsening with meal intake.  Patient reported that she still uses alcohol  occasionally and she blames her flareup of her pancreatitis mostly to Ozempic, the last dose was 3 weeks ago although was instructed to STOP this medication on most recent discharge.   07/11: admitted for pancreatitis, LIpase 116 07/12: lipase down to 61, minimal po intake  07/13: still minimal po / liquids only. Pt encouraged to try soft diet or full liquids. Dilaudid  reduced    Consultants:  none  Procedures/Surgeries: none      ASSESSMENT & PLAN:   Acute on chronic pancreatitis Chronic pain / opiate use  NO EtOH - pt counseled STOP Ozempic per last DC summary - pt counseled Pain management:  Home regimen based on last note:  buprenorphine  tablets 8 mg three times daily. oxycodone  5 mg for breakthrough pain, 45 tablets monthly.  Here:  buprenorphine  tablets 8 mg three times daily. Oxycodone  5 mg po q4h pen mod-severe pain Dilaudid  0.5 mg q2h prn breakthrough pain, stop time tomorrow morning  Advance diet as tolerated   Status post aortic valve replacement Chronic systemic anticoagulation Consult pharmacy for warfarin dosing   HTN Chronic HFpEF Euvolemic to mild dehydration Continue home amlodipine  losartan  and spironolactone     IIDM Discontinue GLP-1 as previously directed  Continue metformin  SSI here    Hypokalemia Hypomagnesemia Replace as needed Monitor BMP    Class 2 obesity based on BMI: Body mass index is 39.46 kg/m.SABRA Significantly low or high BMI is associated with higher medical risk.  Underweight - under 18  overweight - 25 to 29 obese - 30 or more Class 1 obesity: BMI of 30.0 to 34 Class 2 obesity: BMI of 35.0 to 39 Class 3 obesity: BMI of 40.0 to 49 Super Morbid Obesity: BMI 50-59 Super-super Morbid Obesity: BMI 60+ Healthy nutrition and physical activity advised as adjunct to other disease management and risk reduction treatments    DVT prophylaxis: coumadin  IV fluids: continue NS 125 mL/h continuous IV fluids thru today Nutrition: advancing as tolerated Central lines / other devices: none  Code Status: FULL CODE ACP documentation reviewed: none on file in [facility]  TOC needs: none at this time Medical barriers to dispo: IV fluids, pain control. Expected medical readiness for discharge pending po intake and wean to home pain regimen.              Subjective / Brief ROS:  Patient reports persistent abd pain about same as yesterday Denies CP/SOB.  Pain controlled.  Denies new weakness.  Tolerating sips liquids but hasn't tried to eat anything  Reports no concerns w/ urination/defecation.   Family Communication: none at this time     Objective Findings:  Vitals:   01/26/24 1442 01/26/24 2128 01/27/24 0415 01/27/24 0810  BP: (!) 158/81 (!) 143/112 125/72 123/70  Pulse: 77 83 71 79  Resp: 18   16  Temp: 98.3 F (36.8 C) 98.5 F (36.9 C) 98.3 F (36.8 C) 98.2 F (36.8 C)  TempSrc:   Oral   SpO2: 100% 98% 91% 90%  Weight:      Height:        Intake/Output Summary (Last 24 hours) at 01/27/2024 1431 Last data filed at 01/27/2024 1421 Gross per 24 hour  Intake 4514.67 ml  Output --  Net 4514.67 ml   Filed Weights   01/25/24 1402  Weight: 110.9 kg     Examination:  Physical Exam Constitutional:      General: She is not in acute distress.    Appearance: She is not ill-appearing.  Cardiovascular:     Rate and Rhythm: Normal rate and regular rhythm.     Heart sounds: Murmur heard.  Pulmonary:     Effort: Pulmonary effort is normal.     Breath sounds: Normal breath sounds.  Abdominal:     General: Bowel sounds are normal.     Tenderness: There is generalized abdominal tenderness (epigastric).  Skin:    General: Skin is warm and dry.  Neurological:     General: No focal deficit present.     Mental Status: She is alert and oriented to person, place, and time.  Psychiatric:        Mood and Affect: Mood normal.        Behavior: Behavior normal.          Scheduled Medications:   amLODipine   10 mg Oral Daily   atorvastatin   40 mg Oral Daily   buprenorphine   8 mg Sublingual TID   insulin  aspart  0-15 Units Subcutaneous TID WC   losartan   50 mg Oral Daily   magnesium   84 mg Oral Once   [START ON 01/28/2024] metFORMIN   500 mg Oral Q breakfast   pantoprazole   40 mg Oral Daily   spironolactone   25 mg Oral Daily   traZODone   100 mg Oral QHS   warfarin  2 mg Oral ONCE-1600   Warfarin - Pharmacist Dosing Inpatient   Does not apply q1600    Continuous Infusions:    PRN Medications:  albuterol , amitriptyline , HYDROmorphone  (DILAUDID ) injection, ondansetron  (ZOFRAN ) IV, oxyCODONE   Antimicrobials from admission:  Anti-infectives (From admission, onward)    None           Data Reviewed:  I have personally reviewed the following...  CBC: Recent Labs  Lab 01/25/24 1208  WBC 6.2  HGB 13.7  HCT 41.5  MCV 93.0  PLT 268   Basic Metabolic Panel: Recent Labs  Lab 01/25/24 1200 01/25/24 1208 01/26/24 0948 01/27/24 0404  NA  --  135 137 135  K  --  2.9* 3.2* 3.1*  CL  --  97* 102 103  CO2  --  25 24 24   GLUCOSE  --  116* 84 87  BUN  --  7 <5* <5*  CREATININE  --  0.73 0.59 0.34*  CALCIUM   --  9.0 8.6*  8.7*  MG 1.3*  --   --   --   PHOS 2.2*  --   --   --    GFR: Estimated Creatinine Clearance: 115.5 mL/min (A) (by C-G formula based on SCr of 0.34 mg/dL (L)). Liver Function Tests: Recent Labs  Lab 01/25/24 1208  AST 19  ALT 10  ALKPHOS 75  BILITOT 1.4*  PROT 7.2  ALBUMIN 3.9   Recent Labs  Lab 01/25/24 1208 01/26/24 0948 01/27/24 0404  LIPASE 116*  61* 63*   No results for input(s): AMMONIA in the last 168 hours. Coagulation Profile: Recent Labs  Lab 01/25/24 1954 01/26/24 0948 01/27/24 0404  INR 2.1* 2.1* 2.2*   Cardiac Enzymes: No results for input(s): CKTOTAL, CKMB, CKMBINDEX, TROPONINI in the last 168 hours. BNP (last 3 results) No results for input(s): PROBNP in the last 8760 hours. HbA1C: No results for input(s): HGBA1C in the last 72 hours. CBG: Recent Labs  Lab 01/26/24 1140 01/26/24 1742 01/26/24 2123 01/27/24 0807 01/27/24 1216  GLUCAP 78 106* 74 108* 90   Lipid Profile: No results for input(s): CHOL, HDL, LDLCALC, TRIG, CHOLHDL, LDLDIRECT in the last 72 hours. Thyroid Function Tests: No results for input(s): TSH, T4TOTAL, FREET4, T3FREE, THYROIDAB in the last 72 hours. Anemia Panel: No results for input(s): VITAMINB12, FOLATE, FERRITIN, TIBC, IRON, RETICCTPCT in the last 72 hours. Most Recent Urinalysis On File:     Component Value Date/Time   COLORURINE YELLOW (A) 01/25/2024 1530   APPEARANCEUR HAZY (A) 01/25/2024 1530   LABSPEC >1.046 (H) 01/25/2024 1530   PHURINE 6.0 01/25/2024 1530   GLUCOSEU NEGATIVE 01/25/2024 1530   HGBUR NEGATIVE 01/25/2024 1530   BILIRUBINUR NEGATIVE 01/25/2024 1530   KETONESUR 80 (A) 01/25/2024 1530   PROTEINUR 100 (A) 01/25/2024 1530   NITRITE NEGATIVE 01/25/2024 1530   LEUKOCYTESUR NEGATIVE 01/25/2024 1530   Sepsis Labs: @LABRCNTIP (procalcitonin:4,lacticidven:4) Microbiology: No results found for this or any previous visit (from the past 240 hours).     Radiology Studies last 3 days: CT ABDOMEN PELVIS W CONTRAST Result Date: 01/25/2024 CLINICAL DATA:  Abdominal pain, history of pancreatitis EXAM: CT ABDOMEN AND PELVIS WITH CONTRAST TECHNIQUE: Multidetector CT imaging of the abdomen and pelvis was performed using the standard protocol following bolus administration of intravenous contrast. RADIATION DOSE REDUCTION: This exam was performed according to the departmental dose-optimization program which includes automated exposure control, adjustment of the mA and/or kV according to patient size and/or use of iterative reconstruction technique. CONTRAST:  80mL OMNIPAQUE  IOHEXOL  300 MG/ML  SOLN COMPARISON:  CT of the abdomen pelvis performed Dec 09, 2023 FINDINGS: Lower chest: No acute abnormality. Hepatobiliary: Focal fatty infiltration of the anterior liver. Gallbladder is surgically absent. Pancreas: A focal calcification is present within the pancreatic body. Possible mild stranding surrounding the pancreas. No focal fluid collection is appreciated. Spleen: Normal in size without focal abnormality. Adrenals/Urinary Tract: No hydronephrosis or significant nephrolithiasis. Urinary bladder is partially decompressed. Stomach/Bowel: No dilated loops of bowel are appreciated. Stable appearance of the appendix. Vascular/Lymphatic: Mildly prominent lymph nodes are present which may be reactive. Stable appearance of the mesenteric fat. Reproductive: Uterus and bilateral adnexa are unremarkable. Other: Small fat containing umbilical hernia. Musculoskeletal: No acute or significant osseous findings. IMPRESSION: 1. Mild stranding in the peripancreatic fat which can be observed in the setting of pancreatitis. There is no evidence of focal fluid collection, pseudocyst formation, or abscess formation. Electronically Signed   By: Maude Naegeli M.D.   On: 01/25/2024 15:22         Aubreana Cornacchia, DO [facility] 01/27/2024, 2:31 PM    Dictation software may  have been used to generate the above note. Typos may occur and escape review in typed/dictated notes. Please contact Dr Marsa directly for clarity if needed.  Staff may message me via secure chat in Epic  but this may not receive an immediate response,  please page me for urgent matters!  If 7PM-7AM, please contact night coverage [email or web address]

## 2024-01-27 NOTE — Plan of Care (Signed)
  Problem: Education: Goal: Ability to describe self-care measures that may prevent or decrease complications (Diabetes Survival Skills Education) will improve Outcome: Progressing Goal: Individualized Educational Video(s) Outcome: Progressing   Problem: Coping: Goal: Ability to adjust to condition or change in health will improve Outcome: Progressing   Problem: Tissue Perfusion: Goal: Adequacy of tissue perfusion will improve Outcome: Progressing   Problem: Activity: Goal: Risk for activity intolerance will decrease Outcome: Progressing   Problem: Coping: Goal: Level of anxiety will decrease Outcome: Progressing   Problem: Pain Managment: Goal: General experience of comfort will improve and/or be controlled Outcome: Progressing

## 2024-01-27 NOTE — Plan of Care (Signed)

## 2024-01-27 NOTE — Progress Notes (Signed)
 PHARMACY - ANTICOAGULATION CONSULT NOTE   Pharmacy Consult for warfarin Indication: Aortic valve replacement with On-X valve   Allergies       Allergies  Allergen Reactions   Latex Anaphylaxis, Hives and Itching      Other reaction(s): Other (See Comments) SKIN BURNING & PEELING     Nsaids Other (See Comments)      Serve ulcers; stomach aches, GERD.   Other Reaction(s): Other (See Comments)   Tomato Anaphylaxis, Hives and Itching   Gabapentin  Palpitations      Has heart condition; also makes jittery   Metformin         Other Reaction(s): Abdominal Pain   Causes pancreatitis to worsen   Lisinopril         Other Reaction(s): Angioedema   Tramadol Itching and Hives   Duloxetine Anxiety      Makes her jittery      Patient Measurements: Height: 5' 6 (167.6 cm) Weight: 110.9 kg (244 lb 7.8 oz) IBW/kg (Calculated) : 59.3 HEPARIN  DW (KG): 85.7   Vital Signs: Temp: 98 F (36.7 C) (05/30 0500) BP: 92/53 (05/30 0500) Pulse Rate: 71 (05/30 0500)   Labs: Recent Labs (last 2 labs)       Recent Labs    12/12/23 0219 12/13/23 0347 12/14/23 0415  HGB  --  12.6 12.4  HCT  --  37.7 37.2  PLT  --  309 309  LABPROT 17.2* 19.3* 21.6*  INR 1.4* 1.6* 1.9*  CREATININE 0.62 0.73 0.96      Estimated Creatinine Clearance: 96.3 mL/min (by C-G formula based on SCr of 0.96 mg/dL).   Medical History:     Past Medical History:  Diagnosis Date   Arthritis     Asthma     Chronic pain disorder 03/19/2018   GERD (gastroesophageal reflux disease)     Headache     Heart murmur     History of trichomoniasis 03/19/2018   Hypertension     Intractable nausea and vomiting     Pancreatitis     PID (pelvic inflammatory disease) 02/14/2018   Uterine leiomyoma 03/19/2018        PTA Warfarin Regimen 3.75 mg daily (TWD 26.25 mg) Last dose warfarin before admission: 3.75 mg on 7/10 at 2100   Assessment: 43 y.o. female presenting with epigastric pain, radiating to back, which worsens  with meal intake. PMH includes asthma, chronic pancreatitis, chronic pain, AVR (On-X valve) on chronic anticoagulation with warfarin, T2DM, HTN, and HFpEF. Imaging reveals mild stranding in peripancreatic fat. Patient has not had an Ozempic dose for 3 weeks. She takes warfarin in setting of AVR. Pharmacy has been consulted to resume and manage home warfarin.  Weight loss from Ozempic possibly contributing to increased warfarin sensitivity, however patient also recently took Bactrim for UTI which can enhance warfarin's anticoagulant effects.  [facility] notes: Recent supratherapeutic INRs for which regimen was adjusted from 30mg  total weekly dose to 26.25mg  (3.75mg  daily) in early April. Office visit with PCP on 6/27 revealed INR of 2.6 for which patient instructed to hold that night's dose.   Goal of Therapy:  INR 1.5 - 2.0, confirmed with anticoagulation clinic notes from [facility] Monitor platelets by anticoagulation protocol: Yes  Date INR Warfarin Dose 0711 2.1 3mg  0712 2.1 3mg  ordered 0713 2.2 2mg  ordered   Plan: INR slightly supratherapeutic and trending up Give warfarin 2mg  PO x 1 tonight Daily INR CBC at least every 7 days on warfarin Thank you for involving pharmacy in this  patient's care.  Laiklyn Pilkenton A Marinell Igarashi, PharmD Clinical Pharmacist 01/27/2024 7:52 AM

## 2024-01-28 DIAGNOSIS — K858 Other acute pancreatitis without necrosis or infection: Secondary | ICD-10-CM | POA: Diagnosis not present

## 2024-01-28 LAB — BASIC METABOLIC PANEL WITH GFR
Anion gap: 8 (ref 5–15)
BUN: <5 mg/dL — ABNORMAL LOW (ref 6–20)
CO2: 26 mmol/L (ref 22–32)
Calcium: 8.8 mg/dL — ABNORMAL LOW (ref 8.9–10.3)
Chloride: 105 mmol/L (ref 98–111)
Creatinine, Ser: 0.58 mg/dL (ref 0.44–1.00)
GFR, Estimated: >60 mL/min (ref >60)
Glucose, Bld: 92 mg/dL (ref 70–99)
Potassium: 3.4 mmol/L — ABNORMAL LOW (ref 3.5–5.1)
Sodium: 139 mmol/L (ref 135–145)

## 2024-01-28 LAB — CBC
HCT: 37.4 % (ref 36.0–46.0)
Hemoglobin: 12.4 g/dL (ref 12.0–15.0)
MCH: 30.7 pg (ref 26.0–34.0)
MCHC: 33.2 g/dL (ref 30.0–36.0)
MCV: 92.6 fL (ref 80.0–100.0)
Platelets: 231 K/uL (ref 150–400)
RBC: 4.04 MIL/uL (ref 3.87–5.11)
RDW: 13.3 % (ref 11.5–15.5)
WBC: 4.6 K/uL (ref 4.0–10.5)
nRBC: 0 % (ref 0.0–0.2)

## 2024-01-28 LAB — GLUCOSE, CAPILLARY: Glucose-Capillary: 94 mg/dL (ref 70–99)

## 2024-01-28 LAB — PROTIME-INR
INR: 2.4 — ABNORMAL HIGH (ref 0.8–1.2)
Prothrombin Time: 27.1 s — ABNORMAL HIGH (ref 11.4–15.2)

## 2024-01-28 LAB — LIPASE, BLOOD: Lipase: 46 U/L (ref 11–51)

## 2024-01-28 MED ORDER — WARFARIN SODIUM 2 MG PO TABS
2.0000 mg | ORAL_TABLET | Freq: Once | ORAL | Status: AC
  Administered 2024-01-28: 2 mg via ORAL
  Filled 2024-01-28: qty 1

## 2024-01-28 MED ORDER — OXYCODONE HCL 5 MG PO TABS
10.0000 mg | ORAL_TABLET | Freq: Four times a day (QID) | ORAL | Status: DC | PRN
  Administered 2024-01-28 – 2024-01-30 (×6): 10 mg via ORAL
  Filled 2024-01-28 (×6): qty 2

## 2024-01-28 MED ORDER — HYDROMORPHONE HCL 1 MG/ML IJ SOLN
0.5000 mg | INTRAMUSCULAR | Status: DC | PRN
  Administered 2024-01-28 – 2024-01-30 (×10): 0.5 mg via INTRAVENOUS
  Filled 2024-01-28 (×10): qty 0.5

## 2024-01-28 NOTE — Plan of Care (Signed)
  Problem: Education: Goal: Ability to describe self-care measures that may prevent or decrease complications (Diabetes Survival Skills Education) will improve Outcome: Progressing   Problem: Fluid Volume: Goal: Ability to maintain a balanced intake and output will improve Outcome: Progressing   Problem: Metabolic: Goal: Ability to maintain appropriate glucose levels will improve Outcome: Progressing   Problem: Nutritional: Goal: Maintenance of adequate nutrition will improve Outcome: Progressing   Problem: Clinical Measurements: Goal: Will remain free from infection Outcome: Progressing   Problem: Activity: Goal: Risk for activity intolerance will decrease Outcome: Progressing   Problem: Coping: Goal: Level of anxiety will decrease Outcome: Progressing   Problem: Pain Managment: Goal: General experience of comfort will improve and/or be controlled Outcome: Progressing

## 2024-01-28 NOTE — Progress Notes (Signed)
 PROGRESS NOTE    Sarah Sosa   FMW:[id number] DOB: 07-30-1980  DOA: 01/25/2024 Date of Service: 01/28/24 which is hospital day 3  PCP: Clinic, [facility] course / significant events:   HPI: Sarah Sosa is a 43 y.o. female with medical history significant of chronic recurrent pancreatitis, opioid and alcohol abuse, aortic insufficiency and ascending aortic aneurysm status post aortic valve replacement and aneurysm repair on chronic warfarin therapy since September 2023, HTN, IIDM, morbid obesity, presented with worsening of abdominal pain x2 days - epigastric abdominal pain, sharp-like radiating to the back and gradually getting worse, worsening with meal intake.  Patient reported that she still uses alcohol  occasionally and she blames her flareup of her pancreatitis mostly to Ozempic, the last dose was 3 weeks ago although was instructed to STOP this medication on most recent discharge.   07/11: admitted for pancreatitis, LIpase 116 07/12: lipase down to 61, minimal po intake. Dilaudid  1 mg q2h prn 07/13: still minimal po / liquids only. Pt encouraged to try soft diet or full liquids. Dilaudid  reduced to 0.5 mg q2h prn 07/14: lipase normal. Dilaudid  to 0.5 mg q4h for breakthrough only, oxycodone  to 10 mg q6   Consultants:  none  Procedures/Surgeries: none      ASSESSMENT & PLAN:   Acute on chronic pancreatitis Chronic pain / opiate use  NO EtOH - pt counseled STOP Ozempic per last DC summary - pt counseled Pain management:  Home regimen based on last note:  buprenorphine  tablets 8 mg three times daily. oxycodone  5 mg for breakthrough pain, 45 tablets monthly.  Here:  buprenorphine  tablets 8 mg three times daily. Oxycodone  10 mg po q6h pen mod-severe pain Dilaudid  0.5 mg q2h prn breakthrough pain, stop time tomorrow morning  Advance diet as tolerated   Status post aortic valve replacement Chronic systemic anticoagulation Consult pharmacy for  warfarin dosing   HTN Chronic HFpEF Euvolemic to mild dehydration Continue home amlodipine  losartan  and spironolactone    IIDM Discontinue GLP-1 as previously directed  Continue metformin  SSI here    Hypokalemia Hypomagnesemia Replace as needed Monitor BMP    Class 2 obesity based on BMI: Body mass index is 39.46 kg/m.SABRA Significantly low or high BMI is associated with higher medical risk.  Underweight - under 18  overweight - 25 to 29 obese - 30 or more Class 1 obesity: BMI of 30.0 to 34 Class 2 obesity: BMI of 35.0 to 39 Class 3 obesity: BMI of 40.0 to 49 Super Morbid Obesity: BMI 50-59 Super-super Morbid Obesity: BMI 60+ Healthy nutrition and physical activity advised as adjunct to other disease management and risk reduction treatments    DVT prophylaxis: coumadin  IV fluids: continue NS 125 mL/h continuous IV fluids thru today Nutrition: advancing as tolerated Central lines / other devices: none  Code Status: FULL CODE ACP documentation reviewed: none on file in [facility]  TOC needs: none at this time Medical barriers to dispo:  pain control. Expected medical readiness for discharge pending po intake and wean to home pain regimen. Hopefully tomorrow or weds.              Subjective / Brief ROS:  Patient reports persistent abd pain a bit improved from yesterday would like to try advancing diet  Denies CP/SOB.  Pain controlled.  Denies new weakness.  Reports no concerns w/ urination/defecation.   Family Communication: none at this time     Objective Findings:  Vitals:   01/28/24 0003 01/28/24 9942  01/28/24 0512 01/28/24 0819  BP: 134/79  (!) 144/80 132/67  Pulse: 68  70 63  Resp:  16 15 18   Temp: 98.6 F (37 C)  98.6 F (37 C) 98 F (36.7 C)  TempSrc:    Oral  SpO2: 96%  95% 94%  Weight:      Height:        Intake/Output Summary (Last 24 hours) at 01/28/2024 1416 Last data filed at 01/27/2024 1911 Gross per 24 hour  Intake 800 ml  Output  --  Net 800 ml   Filed Weights   01/25/24 1402  Weight: 110.9 kg    Examination:  Physical Exam Constitutional:      General: She is not in acute distress.    Appearance: She is not ill-appearing.  Cardiovascular:     Rate and Rhythm: Normal rate and regular rhythm.     Heart sounds: Murmur heard.  Pulmonary:     Effort: Pulmonary effort is normal.     Breath sounds: Normal breath sounds.  Abdominal:     General: Bowel sounds are normal.     Tenderness: There is generalized abdominal tenderness (epigastric).  Skin:    General: Skin is warm and dry.  Neurological:     General: No focal deficit present.     Mental Status: She is alert and oriented to person, place, and time.  Psychiatric:        Mood and Affect: Mood normal.        Behavior: Behavior normal.          Scheduled Medications:   amLODipine   10 mg Oral Daily   atorvastatin   40 mg Oral Daily   buprenorphine   8 mg Sublingual TID   insulin  aspart  0-15 Units Subcutaneous TID WC   losartan   50 mg Oral Daily   magnesium   84 mg Oral Once   metFORMIN   500 mg Oral Q breakfast   pantoprazole   40 mg Oral Daily   spironolactone   25 mg Oral Daily   traZODone   100 mg Oral QHS   warfarin  2 mg Oral ONCE-1600   Warfarin - Pharmacist Dosing Inpatient   Does not apply q1600    Continuous Infusions:    PRN Medications:  albuterol , amitriptyline , HYDROmorphone  (DILAUDID ) injection, ondansetron  (ZOFRAN ) IV, oxyCODONE   Antimicrobials from admission:  Anti-infectives (From admission, onward)    None           Data Reviewed:  I have personally reviewed the following...  CBC: Recent Labs  Lab 01/25/24 1208 01/28/24 0534  WBC 6.2 4.6  HGB 13.7 12.4  HCT 41.5 37.4  MCV 93.0 92.6  PLT 268 231   Basic Metabolic Panel: Recent Labs  Lab 01/25/24 1200 01/25/24 1208 01/26/24 0948 01/27/24 0404 01/28/24 0534  NA  --  135 137 135 139  K  --  2.9* 3.2* 3.1* 3.4*  CL  --  97* 102 103 105  CO2  --   25 24 24 26   GLUCOSE  --  116* 84 87 92  BUN  --  7 <5* <5* <5*  CREATININE  --  0.73 0.59 0.34* 0.58  CALCIUM   --  9.0 8.6* 8.7* 8.8*  MG 1.3*  --   --   --   --   PHOS 2.2*  --   --   --   --    GFR: Estimated Creatinine Clearance: 115.5 mL/min (by C-G formula based on SCr of 0.58 mg/dL). Liver Function Tests:  Recent Labs  Lab 01/25/24 1208  AST 19  ALT 10  ALKPHOS 75  BILITOT 1.4*  PROT 7.2  ALBUMIN 3.9   Recent Labs  Lab 01/25/24 1208 01/26/24 0948 01/27/24 0404 01/28/24 0534  LIPASE 116* 61* 63* 46   No results for input(s): AMMONIA in the last 168 hours. Coagulation Profile: Recent Labs  Lab 01/25/24 1954 01/26/24 0948 01/27/24 0404 01/28/24 0534  INR 2.1* 2.1* 2.2* 2.4*   Cardiac Enzymes: No results for input(s): CKTOTAL, CKMB, CKMBINDEX, TROPONINI in the last 168 hours. BNP (last 3 results) No results for input(s): PROBNP in the last 8760 hours. HbA1C: No results for input(s): HGBA1C in the last 72 hours. CBG: Recent Labs  Lab 01/27/24 0807 01/27/24 1216 01/27/24 1727 01/27/24 2200 01/28/24 0824  GLUCAP 108* 90 118* 102* 94   Lipid Profile: No results for input(s): CHOL, HDL, LDLCALC, TRIG, CHOLHDL, LDLDIRECT in the last 72 hours. Thyroid Function Tests: No results for input(s): TSH, T4TOTAL, FREET4, T3FREE, THYROIDAB in the last 72 hours. Anemia Panel: No results for input(s): VITAMINB12, FOLATE, FERRITIN, TIBC, IRON, RETICCTPCT in the last 72 hours. Most Recent Urinalysis On File:     Component Value Date/Time   COLORURINE YELLOW (A) 01/25/2024 1530   APPEARANCEUR HAZY (A) 01/25/2024 1530   LABSPEC >1.046 (H) 01/25/2024 1530   PHURINE 6.0 01/25/2024 1530   GLUCOSEU NEGATIVE 01/25/2024 1530   HGBUR NEGATIVE 01/25/2024 1530   BILIRUBINUR NEGATIVE 01/25/2024 1530   KETONESUR 80 (A) 01/25/2024 1530   PROTEINUR 100 (A) 01/25/2024 1530   NITRITE NEGATIVE 01/25/2024 1530   LEUKOCYTESUR  NEGATIVE 01/25/2024 1530   Sepsis Labs: @LABRCNTIP (procalcitonin:4,lacticidven:4) Microbiology: No results found for this or any previous visit (from the past 240 hours).    Radiology Studies last 3 days: CT ABDOMEN PELVIS W CONTRAST Result Date: 01/25/2024 CLINICAL DATA:  Abdominal pain, history of pancreatitis EXAM: CT ABDOMEN AND PELVIS WITH CONTRAST TECHNIQUE: Multidetector CT imaging of the abdomen and pelvis was performed using the standard protocol following bolus administration of intravenous contrast. RADIATION DOSE REDUCTION: This exam was performed according to the departmental dose-optimization program which includes automated exposure control, adjustment of the mA and/or kV according to patient size and/or use of iterative reconstruction technique. CONTRAST:  80mL OMNIPAQUE  IOHEXOL  300 MG/ML  SOLN COMPARISON:  CT of the abdomen pelvis performed Dec 09, 2023 FINDINGS: Lower chest: No acute abnormality. Hepatobiliary: Focal fatty infiltration of the anterior liver. Gallbladder is surgically absent. Pancreas: A focal calcification is present within the pancreatic body. Possible mild stranding surrounding the pancreas. No focal fluid collection is appreciated. Spleen: Normal in size without focal abnormality. Adrenals/Urinary Tract: No hydronephrosis or significant nephrolithiasis. Urinary bladder is partially decompressed. Stomach/Bowel: No dilated loops of bowel are appreciated. Stable appearance of the appendix. Vascular/Lymphatic: Mildly prominent lymph nodes are present which may be reactive. Stable appearance of the mesenteric fat. Reproductive: Uterus and bilateral adnexa are unremarkable. Other: Small fat containing umbilical hernia. Musculoskeletal: No acute or significant osseous findings. IMPRESSION: 1. Mild stranding in the peripancreatic fat which can be observed in the setting of pancreatitis. There is no evidence of focal fluid collection, pseudocyst formation, or abscess formation.  Electronically Signed   By: Maude Naegeli M.D.   On: 01/25/2024 15:22         Larae Caison, DO [facility] 01/28/2024, 2:16 PM    Dictation software may have been used to generate the above note. Typos may occur and escape review in typed/dictated notes. Please contact Dr Marsa  directly for clarity if needed.  Staff may message me via secure chat in Epic  but this may not receive an immediate response,  please page me for urgent matters!  If 7PM-7AM, please contact night coverage [email or web address]

## 2024-01-28 NOTE — Progress Notes (Signed)
 Pt's bed elevated from the ground per patient request. Offered to lower the bed down to reduce the risk of falls. Charge RN Arland in the room. Pt educated on importance of bed in low position to prevent and reduce the risk of injury related to fall. Pt is alert and oriented X4 and understands the education information provided but continues to refuse lowering the bed. Pt did demonstrate getting in and out of bed in current high position and stating it is easier for her to get in and out of bed with bed in higher position.

## 2024-01-28 NOTE — Progress Notes (Signed)
 Patient refused blood glucose check.  Dr Marsa notified with no additional orders.

## 2024-01-28 NOTE — Progress Notes (Signed)
 PHARMACY - ANTICOAGULATION CONSULT NOTE   Pharmacy Consult for warfarin Indication: Aortic valve replacement with On-X valve   Allergies       Allergies  Allergen Reactions   Latex Anaphylaxis, Hives and Itching      Other reaction(s): Other (See Comments) SKIN BURNING & PEELING     Nsaids Other (See Comments)      Serve ulcers; stomach aches, GERD.   Other Reaction(s): Other (See Comments)   Tomato Anaphylaxis, Hives and Itching   Gabapentin  Palpitations      Has heart condition; also makes jittery   Metformin         Other Reaction(s): Abdominal Pain   Causes pancreatitis to worsen   Lisinopril         Other Reaction(s): Angioedema   Tramadol Itching and Hives   Duloxetine Anxiety      Makes her jittery      Patient Measurements: Height: 5' 6 (167.6 cm) Weight: 110.9 kg (244 lb 7.8 oz) IBW/kg (Calculated) : 59.3 HEPARIN  DW (KG): 85.7   Vital Signs: Temp: 98 F (36.7 C) (05/30 0500) BP: 92/53 (05/30 0500) Pulse Rate: 71 (05/30 0500)   Labs: Recent Labs (last 2 labs)       Recent Labs    12/12/23 0219 12/13/23 0347 12/14/23 0415  HGB  --  12.6 12.4  HCT  --  37.7 37.2  PLT  --  309 309  LABPROT 17.2* 19.3* 21.6*  INR 1.4* 1.6* 1.9*  CREATININE 0.62 0.73 0.96      Estimated Creatinine Clearance: 96.3 mL/min (by C-G formula based on SCr of 0.96 mg/dL).   Medical History:     Past Medical History:  Diagnosis Date   Arthritis     Asthma     Chronic pain disorder 03/19/2018   GERD (gastroesophageal reflux disease)     Headache     Heart murmur     History of trichomoniasis 03/19/2018   Hypertension     Intractable nausea and vomiting     Pancreatitis     PID (pelvic inflammatory disease) 02/14/2018   Uterine leiomyoma 03/19/2018        PTA Warfarin Regimen 3.75 mg daily (TWD 26.25 mg) Last dose warfarin before admission: 3.75 mg on 7/10 at 2100   Assessment: 43 y.o. female presenting with epigastric pain, radiating to back, which worsens  with meal intake. PMH includes asthma, chronic pancreatitis, chronic pain, AVR (On-X valve) on chronic anticoagulation with warfarin, T2DM, HTN, and HFpEF. Imaging reveals mild stranding in peripancreatic fat. Patient has not had an Ozempic dose for 3 weeks. She takes warfarin in setting of AVR. Pharmacy has been consulted to resume and manage home warfarin.  Weight loss from Ozempic possibly contributing to increased warfarin sensitivity, however patient also recently took Bactrim for UTI which can enhance warfarin's anticoagulant effects.  [facility] notes: Recent supratherapeutic INRs for which regimen was adjusted from 30mg  total weekly dose to 26.25mg  (3.75mg  daily) in early April. Office visit with PCP on 6/27 revealed INR of 2.6 for which patient instructed to hold that night's dose.   Goal of Therapy:  INR 1.5 - 2.0, confirmed with anticoagulation clinic notes from [facility] Monitor platelets by anticoagulation protocol: Yes  Date INR Warfarin Dose 0711 2.1 3 mg 0712 2.1 3 mg ordered 0713 2.2 2 mg ordered 0714 2.4 2 mg ordered   Plan: INR remains supratherapeutic and trending up No signs/symptoms of bleeding noted, CBC is stable and WNL Give warfarin 2 mg PO x  1 tonight Daily INR while inpatient CBC at least every 7 days on warfarin Thank you for involving pharmacy in this patient's care.   Damien Napoleon, PharmD Clinical Pharmacist 01/28/2024 7:21 AM

## 2024-01-29 DIAGNOSIS — K858 Other acute pancreatitis without necrosis or infection: Secondary | ICD-10-CM | POA: Diagnosis not present

## 2024-01-29 LAB — PROTIME-INR
INR: 2.2 — ABNORMAL HIGH (ref 0.8–1.2)
Prothrombin Time: 25.5 s — ABNORMAL HIGH (ref 11.4–15.2)

## 2024-01-29 LAB — LIPASE, BLOOD: Lipase: 48 U/L (ref 11–51)

## 2024-01-29 MED ORDER — POTASSIUM CHLORIDE 20 MEQ PO PACK
40.0000 meq | PACK | Freq: Once | ORAL | Status: AC
  Administered 2024-01-29: 40 meq via ORAL
  Filled 2024-01-29: qty 2

## 2024-01-29 MED ORDER — WARFARIN SODIUM 2 MG PO TABS
2.0000 mg | ORAL_TABLET | Freq: Once | ORAL | Status: AC
  Administered 2024-01-29: 2 mg via ORAL
  Filled 2024-01-29: qty 1

## 2024-01-29 NOTE — Plan of Care (Signed)

## 2024-01-29 NOTE — Progress Notes (Signed)
 PHARMACY - ANTICOAGULATION CONSULT NOTE   Pharmacy Consult for warfarin Indication: Aortic valve replacement with On-X valve   Allergies       Allergies  Allergen Reactions   Latex Anaphylaxis, Hives and Itching      Other reaction(s): Other (See Comments) SKIN BURNING & PEELING     Nsaids Other (See Comments)      Serve ulcers; stomach aches, GERD.   Other Reaction(s): Other (See Comments)   Tomato Anaphylaxis, Hives and Itching   Gabapentin  Palpitations      Has heart condition; also makes jittery   Metformin         Other Reaction(s): Abdominal Pain   Causes pancreatitis to worsen   Lisinopril         Other Reaction(s): Angioedema   Tramadol Itching and Hives   Duloxetine Anxiety      Makes her jittery      Patient Measurements: Height: 5' 6 (167.6 cm) Weight: 110.9 kg (244 lb 7.8 oz) IBW/kg (Calculated) : 59.3 HEPARIN  DW (KG): 85.7   Vital Signs: Temp: 98 F (36.7 C) (05/30 0500) BP: 92/53 (05/30 0500) Pulse Rate: 71 (05/30 0500)   Labs: Recent Labs (last 2 labs)       Recent Labs    12/12/23 0219 12/13/23 0347 12/14/23 0415  HGB  --  12.6 12.4  HCT  --  37.7 37.2  PLT  --  309 309  LABPROT 17.2* 19.3* 21.6*  INR 1.4* 1.6* 1.9*  CREATININE 0.62 0.73 0.96      Estimated Creatinine Clearance: 96.3 mL/min (by C-G formula based on SCr of 0.96 mg/dL).   Medical History:     Past Medical History:  Diagnosis Date   Arthritis     Asthma     Chronic pain disorder 03/19/2018   GERD (gastroesophageal reflux disease)     Headache     Heart murmur     History of trichomoniasis 03/19/2018   Hypertension     Intractable nausea and vomiting     Pancreatitis     PID (pelvic inflammatory disease) 02/14/2018   Uterine leiomyoma 03/19/2018        PTA Warfarin Regimen 3.75 mg daily (TWD 26.25 mg) Last dose warfarin before admission: 3.75 mg on 7/10 at 2100   Assessment: 43 y.o. female presenting with epigastric pain, radiating to back, which worsens  with meal intake. PMH includes asthma, chronic pancreatitis, chronic pain, AVR (On-X valve) on chronic anticoagulation with warfarin, T2DM, HTN, and HFpEF. Imaging reveals mild stranding in peripancreatic fat. Patient has not had an Ozempic dose for 3 weeks. She takes warfarin in setting of AVR. Pharmacy has been consulted to resume and manage home warfarin.  Weight loss from Ozempic possibly contributing to increased warfarin sensitivity, however patient also recently took Bactrim for UTI which can enhance warfarin's anticoagulant effects.  [facility] notes: Recent supratherapeutic INRs for which regimen was adjusted from 30mg  total weekly dose to 26.25mg  (3.75mg  daily) in early April. Office visit with PCP on 6/27 revealed INR of 2.6 for which patient instructed to hold that night's dose.   Goal of Therapy:  INR 1.5 - 2.0, confirmed with anticoagulation clinic notes from [facility] Monitor platelets by anticoagulation protocol: Yes  Date INR Warfarin Dose 0711 2.1 3 mg 0712 2.1 3 mg  0713 2.2 2 mg  0714 2.4 2 mg  0715 2.2 2 mg ordered   Plan: INR remains supratherapeutic but beginning to trend down No signs/symptoms of bleeding noted, CBC is stable and  WNL Give warfarin 2 mg PO x 1 tonight Daily INR while inpatient CBC at least every 7 days on warfarin Thank you for involving pharmacy in this patient's care.   Damien Napoleon, PharmD Clinical Pharmacist 01/29/2024 7:23 AM

## 2024-01-29 NOTE — Progress Notes (Signed)
 PROGRESS NOTE    Sarah Sosa   FMW:[id number] DOB: [birth year]  DOA: 01/25/2024 Date of Service: 01/29/24 which is hospital day 4  PCP: Clinic, [facility] course / significant events:   HPI: Sarah Sosa is a 43 y.o. female with medical history significant of chronic recurrent pancreatitis, opioid and alcohol abuse, aortic insufficiency and ascending aortic aneurysm status post aortic valve replacement and aneurysm repair on chronic warfarin therapy since September 2023, HTN, IIDM, morbid obesity, presented with worsening of abdominal pain x2 days - epigastric abdominal pain, sharp-like radiating to the back and gradually getting worse, worsening with meal intake.  Patient reported that she still uses alcohol  occasionally and she blames her flareup of her pancreatitis mostly to Ozempic, the last dose was 3 weeks ago although was instructed to STOP this medication on most recent discharge.   07/11: admitted for pancreatitis, LIpase 116 07/12: lipase down to 61, minimal po intake. Dilaudid  1 mg q2h prn 07/13: still minimal po / liquids only. Pt encouraged to try soft diet or full liquids. Dilaudid  reduced to 0.5 mg q2h prn 07/14: lipase normal. Dilaudid  to 0.5 mg q4h for breakthrough only, oxycodone  to 10 mg q6 07/15: still not eating solids   Consultants:  none  Procedures/Surgeries: none      ASSESSMENT & PLAN:   Acute on chronic pancreatitis Chronic pain / opiate use  NO EtOH - pt counseled STOP Ozempic per last DC summary - pt counseled Pain management:  Home regimen based on last note:  buprenorphine  tablets 8 mg three times daily. oxycodone  5 mg for breakthrough pain, 45 tablets monthly.  Here:  buprenorphine  tablets 8 mg three times daily. Oxycodone  10 mg po q6h pen mod-severe pain Dilaudid  0.5 mg q2h prn breakthrough pain Advance diet as tolerated   Status post aortic valve replacement Chronic systemic anticoagulation Consult pharmacy for  warfarin dosing   HTN Chronic HFpEF Euvolemic to mild dehydration Continue home amlodipine  losartan  and spironolactone    IIDM Discontinue GLP-1 as previously directed  Pt declining Glc checks and insulin     Hypokalemia Hypomagnesemia Replace as needed Monitor BMP    Class 2 obesity based on BMI: Body mass index is 39.46 kg/m.SABRA Significantly low or high BMI is associated with higher medical risk.  Underweight - under 18  overweight - 25 to 29 obese - 30 or more Class 1 obesity: BMI of 30.0 to 34 Class 2 obesity: BMI of 35.0 to 39 Class 3 obesity: BMI of 40.0 to 49 Super Morbid Obesity: BMI 50-59 Super-super Morbid Obesity: BMI 60+ Healthy nutrition and physical activity advised as adjunct to other disease management and risk reduction treatments    DVT prophylaxis: coumadin  IV fluids: continue NS 125 mL/h continuous IV fluids thru today Nutrition: advancing as tolerated Central lines / other devices: none  Code Status: FULL CODE ACP documentation reviewed: none on file in [facility]  TOC needs: none at this time Medical barriers to dispo:  pain control. Expected medical readiness for discharge pending po intake and wean to home pain regimen. Hopefully tomorrow              Subjective / Brief ROS:  Patient reports persistent abd pain a bit improved from yesterday - states still isn't eating any solids and significant pain w/ eating  Denies CP/SOB.  Denies new weakness.    Family Communication: none at this time     Objective Findings:  Vitals:   01/28/24 0819 01/28/24 2121 01/29/24 0422  01/29/24 0853  BP: 132/67 126/81 129/70 137/78  Pulse: 63 80 65   Resp: 18 18 16    Temp: 98 F (36.7 C) 97.9 F (36.6 C) 98.1 F (36.7 C)   TempSrc: Oral     SpO2: 94% 96% 93%   Weight:      Height:        Intake/Output Summary (Last 24 hours) at 01/29/2024 1359 Last data filed at 01/29/2024 0900 Gross per 24 hour  Intake 480 ml  Output --  Net 480 ml    Filed Weights   01/25/24 1402  Weight: 110.9 kg    Examination:  Physical Exam Constitutional:      General: She is not in acute distress.    Appearance: She is not ill-appearing.  Cardiovascular:     Rate and Rhythm: Normal rate and regular rhythm.     Heart sounds: Murmur heard.  Pulmonary:     Effort: Pulmonary effort is normal.     Breath sounds: Normal breath sounds.  Abdominal:     General: Bowel sounds are normal.     Tenderness: There is generalized abdominal tenderness (epigastric).  Skin:    General: Skin is warm and dry.  Neurological:     General: No focal deficit present.     Mental Status: She is alert and oriented to person, place, and time.  Psychiatric:        Mood and Affect: Mood normal.        Behavior: Behavior normal.          Scheduled Medications:   amLODipine   10 mg Oral Daily   atorvastatin   40 mg Oral Daily   buprenorphine   8 mg Sublingual TID   losartan   50 mg Oral Daily   magnesium   84 mg Oral Once   metFORMIN   500 mg Oral Q breakfast   pantoprazole   40 mg Oral Daily   spironolactone   25 mg Oral Daily   traZODone   100 mg Oral QHS   warfarin  2 mg Oral ONCE-1600   Warfarin - Pharmacist Dosing Inpatient   Does not apply q1600    Continuous Infusions:    PRN Medications:  albuterol , amitriptyline , HYDROmorphone  (DILAUDID ) injection, ondansetron  (ZOFRAN ) IV, oxyCODONE   Antimicrobials from admission:  Anti-infectives (From admission, onward)    None           Data Reviewed:  I have personally reviewed the following...  CBC: Recent Labs  Lab 01/25/24 1208 01/28/24 0534  WBC 6.2 4.6  HGB 13.7 12.4  HCT 41.5 37.4  MCV 93.0 92.6  PLT 268 231   Basic Metabolic Panel: Recent Labs  Lab 01/25/24 1200 01/25/24 1208 01/26/24 0948 01/27/24 0404 01/28/24 0534  NA  --  135 137 135 139  K  --  2.9* 3.2* 3.1* 3.4*  CL  --  97* 102 103 105  CO2  --  25 24 24 26   GLUCOSE  --  116* 84 87 92  BUN  --  7 <5* <5*  <5*  CREATININE  --  0.73 0.59 0.34* 0.58  CALCIUM   --  9.0 8.6* 8.7* 8.8*  MG 1.3*  --   --   --   --   PHOS 2.2*  --   --   --   --    GFR: Estimated Creatinine Clearance: 115.5 mL/min (by C-G formula based on SCr of 0.58 mg/dL). Liver Function Tests: Recent Labs  Lab 01/25/24 1208  AST 19  ALT 10  ALKPHOS 75  BILITOT 1.4*  PROT 7.2  ALBUMIN 3.9   Recent Labs  Lab 01/25/24 1208 01/26/24 0948 01/27/24 0404 01/28/24 0534 01/29/24 0550  LIPASE 116* 61* 63* 46 48   No results for input(s): AMMONIA in the last 168 hours. Coagulation Profile: Recent Labs  Lab 01/25/24 1954 01/26/24 0948 01/27/24 0404 01/28/24 0534 01/29/24 0550  INR 2.1* 2.1* 2.2* 2.4* 2.2*   Cardiac Enzymes: No results for input(s): CKTOTAL, CKMB, CKMBINDEX, TROPONINI in the last 168 hours. BNP (last 3 results) No results for input(s): PROBNP in the last 8760 hours. HbA1C: No results for input(s): HGBA1C in the last 72 hours. CBG: Recent Labs  Lab 01/27/24 0807 01/27/24 1216 01/27/24 1727 01/27/24 2200 01/28/24 0824  GLUCAP 108* 90 118* 102* 94   Lipid Profile: No results for input(s): CHOL, HDL, LDLCALC, TRIG, CHOLHDL, LDLDIRECT in the last 72 hours. Thyroid Function Tests: No results for input(s): TSH, T4TOTAL, FREET4, T3FREE, THYROIDAB in the last 72 hours. Anemia Panel: No results for input(s): VITAMINB12, FOLATE, FERRITIN, TIBC, IRON, RETICCTPCT in the last 72 hours. Most Recent Urinalysis On File:     Component Value Date/Time   COLORURINE YELLOW (A) 01/25/2024 1530   APPEARANCEUR HAZY (A) 01/25/2024 1530   LABSPEC >1.046 (H) 01/25/2024 1530   PHURINE 6.0 01/25/2024 1530   GLUCOSEU NEGATIVE 01/25/2024 1530   HGBUR NEGATIVE 01/25/2024 1530   BILIRUBINUR NEGATIVE 01/25/2024 1530   KETONESUR 80 (A) 01/25/2024 1530   PROTEINUR 100 (A) 01/25/2024 1530   NITRITE NEGATIVE 01/25/2024 1530   LEUKOCYTESUR NEGATIVE 01/25/2024 1530    Sepsis Labs: @LABRCNTIP (procalcitonin:4,lacticidven:4) Microbiology: No results found for this or any previous visit (from the past 240 hours).    Radiology Studies last 3 days: CT ABDOMEN PELVIS W CONTRAST Result Date: 01/25/2024 CLINICAL DATA:  Abdominal pain, history of pancreatitis EXAM: CT ABDOMEN AND PELVIS WITH CONTRAST TECHNIQUE: Multidetector CT imaging of the abdomen and pelvis was performed using the standard protocol following bolus administration of intravenous contrast. RADIATION DOSE REDUCTION: This exam was performed according to the departmental dose-optimization program which includes automated exposure control, adjustment of the mA and/or kV according to patient size and/or use of iterative reconstruction technique. CONTRAST:  80mL OMNIPAQUE  IOHEXOL  300 MG/ML  SOLN COMPARISON:  CT of the abdomen pelvis performed Dec 09, 2023 FINDINGS: Lower chest: No acute abnormality. Hepatobiliary: Focal fatty infiltration of the anterior liver. Gallbladder is surgically absent. Pancreas: A focal calcification is present within the pancreatic body. Possible mild stranding surrounding the pancreas. No focal fluid collection is appreciated. Spleen: Normal in size without focal abnormality. Adrenals/Urinary Tract: No hydronephrosis or significant nephrolithiasis. Urinary bladder is partially decompressed. Stomach/Bowel: No dilated loops of bowel are appreciated. Stable appearance of the appendix. Vascular/Lymphatic: Mildly prominent lymph nodes are present which may be reactive. Stable appearance of the mesenteric fat. Reproductive: Uterus and bilateral adnexa are unremarkable. Other: Small fat containing umbilical hernia. Musculoskeletal: No acute or significant osseous findings. IMPRESSION: 1. Mild stranding in the peripancreatic fat which can be observed in the setting of pancreatitis. There is no evidence of focal fluid collection, pseudocyst formation, or abscess formation. Electronically Signed    By: Maude Naegeli M.D.   On: 01/25/2024 15:22         Oakleigh Hesketh, DO [facility] 01/29/2024, 1:59 PM    Dictation software may have been used to generate the above note. Typos may occur and escape review in typed/dictated notes. Please contact Dr Marsa directly for clarity if needed.  Staff  may message me via secure chat in Epic  but this may not receive an immediate response,  please page me for urgent matters!  If 7PM-7AM, please contact night coverage [email or web address]

## 2024-01-30 ENCOUNTER — Other Ambulatory Visit: Payer: Self-pay

## 2024-01-30 DIAGNOSIS — K861 Other chronic pancreatitis: Secondary | ICD-10-CM

## 2024-01-30 DIAGNOSIS — K859 Acute pancreatitis without necrosis or infection, unspecified: Secondary | ICD-10-CM | POA: Diagnosis not present

## 2024-01-30 LAB — COMPREHENSIVE METABOLIC PANEL WITH GFR
ALT: 7 U/L (ref 0–44)
AST: 12 U/L — ABNORMAL LOW (ref 15–41)
Albumin: 3.1 g/dL — ABNORMAL LOW (ref 3.5–5.0)
Alkaline Phosphatase: 53 U/L (ref 38–126)
Anion gap: 8 (ref 5–15)
BUN: 6 mg/dL (ref 6–20)
CO2: 27 mmol/L (ref 22–32)
Calcium: 9.1 mg/dL (ref 8.9–10.3)
Chloride: 104 mmol/L (ref 98–111)
Creatinine, Ser: 0.66 mg/dL (ref 0.44–1.00)
GFR, Estimated: >60 mL/min (ref >60)
Glucose, Bld: 104 mg/dL — ABNORMAL HIGH (ref 70–99)
Potassium: 3.2 mmol/L — ABNORMAL LOW (ref 3.5–5.1)
Sodium: 139 mmol/L (ref 135–145)
Total Bilirubin: 0.7 mg/dL (ref 0.0–1.2)
Total Protein: 5.9 g/dL — ABNORMAL LOW (ref 6.5–8.1)

## 2024-01-30 LAB — LIPASE, BLOOD: Lipase: 35 U/L (ref 11–51)

## 2024-01-30 LAB — PROTIME-INR
INR: 1.8 — ABNORMAL HIGH (ref 0.8–1.2)
Prothrombin Time: 21.8 s — ABNORMAL HIGH (ref 11.4–15.2)

## 2024-01-30 MED ORDER — WARFARIN SODIUM 4 MG PO TABS
4.0000 mg | ORAL_TABLET | Freq: Once | ORAL | Status: AC
  Administered 2024-01-30: 4 mg via ORAL
  Filled 2024-01-30: qty 1

## 2024-01-30 MED ORDER — OXYCODONE HCL 5 MG PO TABS
5.0000 mg | ORAL_TABLET | ORAL | 0 refills | Status: DC | PRN
  Filled 2024-01-30: qty 15, 3d supply, fill #0

## 2024-01-30 NOTE — TOC Initial Note (Signed)
 Transition of Care [facility]) - Initial/Assessment Note    Patient Details  Name: Sarah Sosa MRN: [id number] Date of Birth: 1980/11/17  [facility] CM/SW Contact:    Dalia GORMAN Fuse, RN Phone Number: 01/30/2024, 2:27 PM  Clinical Narrative:                  TOC met with the patient in the room. She is from home with her adult children. She doesn't drive, but her father and her boyfriend drive her where she needs to go. Her PCP is [facility] at [facility] and she uses [facility] in [place], [place]. She has DME: [facility].   No TOC needs, please outreach if TOC needs are identified  Expected Discharge Plan: Home/Self Care Barriers to Discharge: Continued Medical Work up   Patient Goals and CMS Choice            Expected Discharge Plan and Services   Discharge Planning Services: CM Consult   Living arrangements for the past 2 months: Single Family Home                                      Prior Living Arrangements/Services Living arrangements for the past 2 months: Single Family Home Lives with:: Adult Children              Current home services: DME    Activities of Daily Living   ADL Screening (condition at time of admission) Independently performs ADLs?: Yes (appropriate for developmental age) Is the patient deaf or have difficulty hearing?: No Does the patient have difficulty seeing, even when wearing glasses/contacts?: No Does the patient have difficulty concentrating, remembering, or making decisions?: No  Permission Sought/Granted                  Emotional Assessment Appearance:: Appears stated age Attitude/Demeanor/Rapport: Apprehensive Affect (typically observed): Flat Orientation: : Oriented to Self, Oriented to Place, Oriented to Situation, Oriented to  Time   Psych Involvement: No (comment)  Admission diagnosis:  Pancreatitis [K85.90] Acute pancreatitis, unspecified complication status, unspecified pancreatitis type  [K85.90] Nausea and vomiting, unspecified vomiting type [R11.2] Patient Active Problem List   Diagnosis Date Noted   Pancreatitis 01/25/2024   Opioid use disorder 12/09/2023   Acute on chronic pancreatitis (HCC) 10/21/2023   Dyslipidemia 10/21/2023   Chronic pain syndrome 10/21/2023   Alcohol intoxication (HCC) 10/21/2023   Type 2 diabetes mellitus without complications (HCC) 10/21/2023   Status post aortic valve replacement with metallic valve 10/21/2023   Secondary hypercoagulability disorder (HCC) 09/15/2023   Adnexal cyst 09/15/2023   Chest pain 09/15/2023   Chronic diastolic CHF (congestive heart failure) (HCC) 09/15/2023   DM2 (diabetes mellitus, type 2) (HCC) 09/15/2023   Acute pain 09/15/2023   Elevated troponin 07/13/2023   Subtherapeutic international normalized ratio (INR) 07/13/2023   Chronic pancreatitis (HCC) 07/13/2023   S/p aortic valve replacement with 'On-X valve (INR target of 1.5 to 2) and ascending aortic graft ([facility], 03/2022) 07/13/2023   CAP (community acquired pneumonia) 07/13/2023   Opioid overdose, accidental or unintentional, initial encounter (HCC) 03/20/2023   Vomiting 03/20/2023   Obesity, Class III, BMI 40-49.9 (morbid obesity) 03/20/2023   History of pancreatitis 03/20/2023   Acute respiratory failure with hypoxia (HCC) 03/20/2023   Chronic use of opiate drug for therapeutic purpose 06/02/2022   S/P ascending aortic replacement 04/12/2022   Hypomagnesemia 11/23/2021   Essential hypertension  11/20/2021   GERD without esophagitis 11/20/2021   (HFpEF) heart failure with preserved ejection fraction (HCC) 11/20/2021   Peripheral neuropathy 11/20/2021   Asthma, chronic 11/20/2021   Acute alcoholic pancreatitis 11/20/2021   Alcohol abuse 11/20/2021   Acute pancreatitis 11/20/2021   History of heavy alcohol consumption 05/22/2021   Recurrent pancreatitis 06/04/2020   Intractable nausea and vomiting    Marijuana use    Hypertensive urgency 04/03/2020    Hypokalemia 04/03/2020   Tobacco abuse 04/03/2020   History of substance abuse (HCC) 04/03/2020   Ileus (HCC) 04/21/2018   Postoperative ileus (HCC) 04/20/2018   Postoperative state 04/15/2018   Uterine leiomyoma 03/19/2018   Menorrhagia with regular cycle 03/19/2018   Chronic pain disorder 03/19/2018   Pelvic pain 03/19/2018   Primary hypertension 03/19/2018   PCP:  [facility]:   [facility] [place] [place] - [address] [place] [address] Phone: [phone] Fax: [phone]  [facility] [address], [place] - [address] [place] [address] Phone: [phone] Fax: [phone]  [facility] [address] [place] [address] Phone: [phone] Fax: [phone]     Social Drivers of Health (SDOH) Social History: SDOH Screenings   Food Insecurity: No Food Insecurity (01/26/2024)  Housing: Low Risk  (01/26/2024)  Transportation Needs: No Transportation Needs (01/26/2024)  Utilities: Not At Risk (01/26/2024)  Financial Resource Strain: High Risk (07/19/2023)   Received from [facility]  Social Connections: Moderately Isolated (12/09/2023)  Stress: No Stress Concern Present (08/31/2023)   Received from [facility]  Tobacco Use: High Risk (01/25/2024)   SDOH Interventions:     Readmission Risk Interventions    11/08/2023    2:16 PM 10/24/2023   11:27 AM 09/20/2023   10:58 AM  Readmission Risk Prevention Plan  Transportation Screening Complete Complete Complete  Medication Review Ocean[facility]) -- Complete Complete  PCP or Specialist appointment within 3-5 days of discharge Complete Complete Complete  HRI or Home Care Consult Not Complete  Complete  HRI or Home Care Consult Pt Refusal Comments Not applicable    SW Recovery Care/Counseling Consult  Complete Complete  Palliative Care Screening Not  Applicable Not Applicable Not Applicable  Skilled Nursing Facility Not Applicable Not Applicable Not Applicable

## 2024-01-30 NOTE — Plan of Care (Signed)

## 2024-01-30 NOTE — Discharge Summary (Signed)
 Sarah Sosa FMW:[id number] DOB: May 09, 1981 DOA: 01/25/2024  PCP: [facility]  Admit date: 01/25/2024 Discharge date: 01/30/2024  Time spent: 35 minutes  Recommendations for Outpatient Follow-up:  Pcp f/u     Discharge Diagnoses:  Active Problems:   Acute on chronic pancreatitis (HCC)   Pancreatitis   Discharge Condition: improved  Diet recommendation: heart healthy  Filed Weights   01/25/24 1402  Weight: 110.9 kg    History of present illness:  From admission h and p Sarah Sosa is a 43 y.o. female with medical history significant of chronic recurrent pancreatitis, opioid and alcohol abuse, aortic insufficiency and ascending aortic aneurysm status post aortic valve replacement and aneurysm repair on chronic warfarin therapy since September 2023, HTN, IIDM, morbid obesity, presented with worsening of abdominal pain.   Symptoms started 2 days ago, patient started develop epigastric abdominal pain, sharp-like radiating to the back and gradually getting worse, worsening with meal intake.  Patient reported that she still uses alcohol  occasionally and she blames her flareup of her pancreatitis mostly to Ozempic, the last dose was 3 weeks ago.  Hospital Course:   Recurrent admissions for pancreatitis here with typical pancreatitis pain, ct showing mild peri-pancreatic fat stranding, no complications. Treated with opioids, fluids, and bowel rest. Now diet advanced, last vomiting 2 days ago, tolerating solid food, pain reasonably controlled (and likely about as good as it will be controlled given patient's chronic pain and opioid dependence). Discharging with instructions to continue to hold ozempic, avoid alcohol completely (continues to drink sometimes), and to follow-up with pcp. Other chronic conditions stable.   Procedures: none   Consultations: none  Discharge Exam: Vitals:   01/30/24 0752 01/30/24 1638  BP: 133/77 136/79  Pulse: 62 86  Resp: 15 18  Temp:  98 F (36.7 C) 98.4 F (36.9 C)  SpO2: 94% 98%    General: NAD Cardiovascular: RRR Respiratory: CTAB Abdomen: obese, soft, mild tenderness  Discharge Instructions   Discharge Instructions     Diet - low sodium heart healthy   Complete by: As directed    Increase activity slowly   Complete by: As directed       Allergies as of 01/30/2024       Reactions   Latex Anaphylaxis, Hives, Itching   Other reaction(s): Other (See Comments) SKIN BURNING & PEELING   Nsaids Other (See Comments)   Serve ulcers; stomach aches, GERD. Other Reaction(s): Other (See Comments)   Tomato Anaphylaxis, Hives, Itching   Gabapentin  Palpitations   Has heart condition; also makes jittery   Metformin     Other Reaction(s): Abdominal Pain Causes pancreatitis to worsen   Lisinopril     Other Reaction(s): Angioedema   Tramadol Itching, Hives   Duloxetine Anxiety   Makes her jittery        Medication List     STOP taking these medications    buprenorphine -naloxone  8-2 mg Subl SL tablet Commonly known as: SUBOXONE    Ozempic (1 MG/DOSE) 4 MG/3ML Sopn Generic drug: Semaglutide (1 MG/DOSE)       TAKE these medications    albuterol  108 (90 Base) MCG/ACT inhaler Commonly known as: VENTOLIN  HFA Inhale 1-2 puffs into the lungs every 6 (six) hours as needed.   amitriptyline  10 MG tablet Commonly known as: ELAVIL  Take 10 mg by mouth 3 (three) times daily as needed for sleep (or pain).   amLODipine  10 MG tablet Commonly known as: NORVASC  Take 1 tablet (10 mg total) by mouth daily.   atorvastatin  40  MG tablet Commonly known as: LIPITOR Take 40 mg by mouth daily.   buprenorphine  8 MG Subl SL tablet Commonly known as: SUBUTEX  Place 8 mg under the tongue. Place 1 tablet (8 mg total) under the tongue 3 (three) times daily   furosemide  40 MG tablet Commonly known as: LASIX  Take 0.5 tablets (20 mg total) by mouth daily as needed. Home med.   lipase/protease/amylase 63999 UNITS Cpep  capsule Commonly known as: CREON  Take 1 capsule (36,000 Units total) by mouth 3 (three) times daily with meals.   losartan  50 MG tablet Commonly known as: COZAAR  Take 1 tablet (50 mg total) by mouth daily.   magnesium  chloride 64 MG Tbec SR tablet Commonly known as: SLOW-MAG Take 1 tablet (64 mg total) by mouth daily.   metFORMIN  500 MG tablet Commonly known as: GLUCOPHAGE  Take 500 mg by mouth every morning.   naloxone  4 MG/0.1ML Liqd nasal spray kit Commonly known as: NARCAN  Place 1 spray into the nose once as needed (to reverse overdose).   omeprazole 40 MG capsule Commonly known as: PRILOSEC Take 40 mg by mouth daily.   ondansetron  4 MG tablet Commonly known as: Zofran  Take 1 tablet (4 mg total) by mouth daily as needed for nausea or vomiting.   oxyCODONE  5 MG immediate release tablet Commonly known as: Oxy IR/ROXICODONE  Take 1 tablet (5 mg total) by mouth every 4 (four) hours as needed for moderate pain (pain score 4-6) or severe pain (pain score 7-10).   spironolactone  25 MG tablet Commonly known as: ALDACTONE  Take 25 mg by mouth daily.   traZODone  50 MG tablet Commonly known as: DESYREL  Take 100 mg by mouth at bedtime.   warfarin 7.5 MG tablet Commonly known as: COUMADIN  Take 0.5 tablets (3.75 mg total) by mouth daily.       Allergies  Allergen Reactions   Latex Anaphylaxis, Hives and Itching    Other reaction(s): Other (See Comments) SKIN BURNING & PEELING    Nsaids Other (See Comments)    Serve ulcers; stomach aches, GERD.  Other Reaction(s): Other (See Comments)   Tomato Anaphylaxis, Hives and Itching   Gabapentin  Palpitations    Has heart condition; also makes jittery   Metformin      Other Reaction(s): Abdominal Pain  Causes pancreatitis to worsen   Lisinopril      Other Reaction(s): Angioedema   Tramadol Itching and Hives   Duloxetine Anxiety    Makes her jittery    Follow-up Information     [facility].    Contact information: [address] [place] [address] [phone]                  The results of significant diagnostics from this hospitalization (including imaging, microbiology, ancillary and laboratory) are listed below for reference.    Significant Diagnostic Studies: CT ABDOMEN PELVIS W CONTRAST Result Date: 01/25/2024 CLINICAL DATA:  Abdominal pain, history of pancreatitis EXAM: CT ABDOMEN AND PELVIS WITH CONTRAST TECHNIQUE: Multidetector CT imaging of the abdomen and pelvis was performed using the standard protocol following bolus administration of intravenous contrast. RADIATION DOSE REDUCTION: This exam was performed according to the departmental dose-optimization program which includes automated exposure control, adjustment of the mA and/or kV according to patient size and/or use of iterative reconstruction technique. CONTRAST:  80mL OMNIPAQUE  IOHEXOL  300 MG/ML  SOLN COMPARISON:  CT of the abdomen pelvis performed Dec 09, 2023 FINDINGS: Lower chest: No acute abnormality. Hepatobiliary: Focal fatty infiltration of the  anterior liver. Gallbladder is surgically absent. Pancreas: A focal calcification is present within the pancreatic body. Possible mild stranding surrounding the pancreas. No focal fluid collection is appreciated. Spleen: Normal in size without focal abnormality. Adrenals/Urinary Tract: No hydronephrosis or significant nephrolithiasis. Urinary bladder is partially decompressed. Stomach/Bowel: No dilated loops of bowel are appreciated. Stable appearance of the appendix. Vascular/Lymphatic: Mildly prominent lymph nodes are present which may be reactive. Stable appearance of the mesenteric fat. Reproductive: Uterus and bilateral adnexa are unremarkable. Other: Small fat containing umbilical hernia. Musculoskeletal: No acute or significant osseous findings. IMPRESSION: 1. Mild stranding in the peripancreatic fat which can be observed in the setting of  pancreatitis. There is no evidence of focal fluid collection, pseudocyst formation, or abscess formation. Electronically Signed   By: Maude Naegeli M.D.   On: 01/25/2024 15:22    Microbiology: No results found for this or any previous visit (from the past 240 hours).   Labs: Basic Metabolic Panel: Recent Labs  Lab 01/25/24 1200 01/25/24 1208 01/26/24 0948 01/27/24 0404 01/28/24 0534 01/30/24 0447  NA  --  135 137 135 139 139  K  --  2.9* 3.2* 3.1* 3.4* 3.2*  CL  --  97* 102 103 105 104  CO2  --  25 24 24 26 27   GLUCOSE  --  116* 84 87 92 104*  BUN  --  7 <5* <5* <5* 6  CREATININE  --  0.73 0.59 0.34* 0.58 0.66  CALCIUM   --  9.0 8.6* 8.7* 8.8* 9.1  MG 1.3*  --   --   --   --   --   PHOS 2.2*  --   --   --   --   --    Liver Function Tests: Recent Labs  Lab 01/25/24 1208 01/30/24 0447  AST 19 12*  ALT 10 7  ALKPHOS 75 53  BILITOT 1.4* 0.7  PROT 7.2 5.9*  ALBUMIN 3.9 3.1*   Recent Labs  Lab 01/26/24 0948 01/27/24 0404 01/28/24 0534 01/29/24 0550 01/30/24 0447  LIPASE 61* 63* 46 48 35   No results for input(s): AMMONIA in the last 168 hours. CBC: Recent Labs  Lab 01/25/24 1208 01/28/24 0534  WBC 6.2 4.6  HGB 13.7 12.4  HCT 41.5 37.4  MCV 93.0 92.6  PLT 268 231   Cardiac Enzymes: No results for input(s): CKTOTAL, CKMB, CKMBINDEX, TROPONINI in the last 168 hours. BNP: BNP (last 3 results) No results for input(s): BNP in the last 8760 hours.  ProBNP (last 3 results) No results for input(s): PROBNP in the last 8760 hours.  CBG: Recent Labs  Lab 01/27/24 0807 01/27/24 1216 01/27/24 1727 01/27/24 2200 01/28/24 0824  GLUCAP 108* 90 118* 102* 94       Signed:  Devaughn KATHEE Ban MD.  [facility] 01/30/2024, 4:55 PM

## 2024-01-30 NOTE — Progress Notes (Signed)
 PHARMACY - ANTICOAGULATION CONSULT NOTE   Pharmacy Consult for warfarin Indication: Aortic valve replacement with On-X valve   Allergies       Allergies  Allergen Reactions   Latex Anaphylaxis, Hives and Itching      Other reaction(s): Other (See Comments) SKIN BURNING & PEELING     Nsaids Other (See Comments)      Serve ulcers; stomach aches, GERD.   Other Reaction(s): Other (See Comments)   Tomato Anaphylaxis, Hives and Itching   Gabapentin  Palpitations      Has heart condition; also makes jittery   Metformin         Other Reaction(s): Abdominal Pain   Causes pancreatitis to worsen   Lisinopril         Other Reaction(s): Angioedema   Tramadol Itching and Hives   Duloxetine Anxiety      Makes her jittery      Patient Measurements: Height: 5' 6 (167.6 cm) Weight: 110.9 kg (244 lb 7.8 oz) IBW/kg (Calculated) : 59.3 HEPARIN  DW (KG): 85.7   Vital Signs: Temp: 98 F (36.7 C) (05/30 0500) BP: 92/53 (05/30 0500) Pulse Rate: 71 (05/30 0500)   Labs: Recent Labs (last 2 labs)       Recent Labs    12/12/23 0219 12/13/23 0347 12/14/23 0415  HGB  --  12.6 12.4  HCT  --  37.7 37.2  PLT  --  309 309  LABPROT 17.2* 19.3* 21.6*  INR 1.4* 1.6* 1.9*  CREATININE 0.62 0.73 0.96      Estimated Creatinine Clearance: 96.3 mL/min (by C-G formula based on SCr of 0.96 mg/dL).   Medical History:     Past Medical History:  Diagnosis Date   Arthritis     Asthma     Chronic pain disorder 03/19/2018   GERD (gastroesophageal reflux disease)     Headache     Heart murmur     History of trichomoniasis 03/19/2018   Hypertension     Intractable nausea and vomiting     Pancreatitis     PID (pelvic inflammatory disease) 02/14/2018   Uterine leiomyoma 03/19/2018        PTA Warfarin Regimen 3.75 mg daily (TWD 26.25 mg) Last dose warfarin before admission: 3.75 mg on 7/10 at 2100   Assessment: 43 y.o. female presenting with epigastric pain, radiating to back, which worsens  with meal intake. PMH includes asthma, chronic pancreatitis, chronic pain, AVR (On-X valve) on chronic anticoagulation with warfarin, T2DM, HTN, and HFpEF. Imaging reveals mild stranding in peripancreatic fat. Patient has not had an Ozempic dose for 3 weeks. She takes warfarin in setting of AVR. Pharmacy has been consulted to resume and manage home warfarin.  Weight loss from Ozempic possibly contributing to increased warfarin sensitivity, however patient also recently took Bactrim for UTI which can enhance warfarin's anticoagulant effects.  [facility] notes: Recent supratherapeutic INRs for which regimen was adjusted from 30mg  total weekly dose to 26.25mg  (3.75mg  daily) in early April. Office visit with PCP on 6/27 revealed INR of 2.6 for which patient instructed to hold that night's dose.   Goal of Therapy:  INR 1.5 - 2.0, confirmed with anticoagulation clinic notes from [facility] Monitor platelets by anticoagulation protocol: Yes  Date INR Warfarin Dose 0711 2.1 3 mg 0712 2.1 3 mg  0713 2.2 2 mg  0714 2.4 2 mg  0715 2.2 2 mg 0716 1.8 4 mg ordered   Plan: INR is therapeutic this AM, but anticipate to continue to trend down given  reduced doses in the last few days No signs/symptoms of bleeding noted, CBC is stable and WNL Give warfarin 4 mg PO x 1 tonight (~20% higher than home dose) Daily INR while inpatient CBC at least every 7 days on warfarin Thank you for involving pharmacy in this patient's care.   Damien Napoleon, PharmD Clinical Pharmacist 01/30/2024 7:26 AM

## 2024-03-31 ENCOUNTER — Other Ambulatory Visit: Payer: Self-pay

## 2024-03-31 ENCOUNTER — Observation Stay
Admission: EM | Admit: 2024-03-31 | Discharge: 2024-04-01 | Disposition: A | Discharge status: Home / Self Care | Payer: MEDICAID | Attending: Hospitalist | Admitting: Hospitalist

## 2024-03-31 DIAGNOSIS — F1290 Cannabis use, unspecified, uncomplicated: Secondary | ICD-10-CM | POA: Diagnosis not present

## 2024-03-31 DIAGNOSIS — I10 Essential (primary) hypertension: Secondary | ICD-10-CM | POA: Diagnosis not present

## 2024-03-31 DIAGNOSIS — F17210 Nicotine dependence, cigarettes, uncomplicated: Secondary | ICD-10-CM | POA: Diagnosis not present

## 2024-03-31 DIAGNOSIS — Z7984 Long term (current) use of oral hypoglycemic drugs: Secondary | ICD-10-CM | POA: Diagnosis not present

## 2024-03-31 DIAGNOSIS — T8209XA Other mechanical complication of heart valve prosthesis, initial encounter: Secondary | ICD-10-CM | POA: Diagnosis not present

## 2024-03-31 DIAGNOSIS — K861 Other chronic pancreatitis: Secondary | ICD-10-CM | POA: Diagnosis not present

## 2024-03-31 DIAGNOSIS — F1010 Alcohol abuse, uncomplicated: Secondary | ICD-10-CM | POA: Diagnosis not present

## 2024-03-31 DIAGNOSIS — Z952 Presence of prosthetic heart valve: Secondary | ICD-10-CM | POA: Diagnosis not present

## 2024-03-31 DIAGNOSIS — Z91040 Latex allergy status: Secondary | ICD-10-CM | POA: Diagnosis not present

## 2024-03-31 DIAGNOSIS — E119 Type 2 diabetes mellitus without complications: Secondary | ICD-10-CM | POA: Insufficient documentation

## 2024-03-31 DIAGNOSIS — R791 Abnormal coagulation profile: Secondary | ICD-10-CM | POA: Diagnosis present

## 2024-03-31 DIAGNOSIS — Z7901 Long term (current) use of anticoagulants: Secondary | ICD-10-CM | POA: Insufficient documentation

## 2024-03-31 DIAGNOSIS — E876 Hypokalemia: Secondary | ICD-10-CM | POA: Diagnosis present

## 2024-03-31 DIAGNOSIS — K859 Acute pancreatitis without necrosis or infection, unspecified: Principal | ICD-10-CM | POA: Diagnosis present

## 2024-03-31 DIAGNOSIS — Z79899 Other long term (current) drug therapy: Secondary | ICD-10-CM | POA: Diagnosis not present

## 2024-03-31 DIAGNOSIS — G894 Chronic pain syndrome: Secondary | ICD-10-CM | POA: Insufficient documentation

## 2024-03-31 DIAGNOSIS — R109 Unspecified abdominal pain: Secondary | ICD-10-CM | POA: Diagnosis present

## 2024-03-31 DIAGNOSIS — R112 Nausea with vomiting, unspecified: Secondary | ICD-10-CM | POA: Diagnosis present

## 2024-03-31 LAB — LIPASE, BLOOD: Lipase: 29 U/L (ref 11–51)

## 2024-03-31 LAB — COMPREHENSIVE METABOLIC PANEL WITH GFR
ALT: 33 U/L (ref 0–44)
AST: 92 U/L — ABNORMAL HIGH (ref 15–41)
Albumin: 4.1 g/dL (ref 3.5–5.0)
Alkaline Phosphatase: 89 U/L (ref 38–126)
Anion gap: 11 (ref 5–15)
BUN: 6 mg/dL (ref 6–20)
CO2: 24 mmol/L (ref 22–32)
Calcium: 8.8 mg/dL — ABNORMAL LOW (ref 8.9–10.3)
Chloride: 104 mmol/L (ref 98–111)
Creatinine, Ser: 0.67 mg/dL (ref 0.44–1.00)
GFR, Estimated: >60 mL/min (ref >60)
Glucose, Bld: 121 mg/dL — ABNORMAL HIGH (ref 70–99)
Potassium: 3.1 mmol/L — ABNORMAL LOW (ref 3.5–5.1)
Sodium: 139 mmol/L (ref 135–145)
Total Bilirubin: 1 mg/dL (ref 0.0–1.2)
Total Protein: 8 g/dL (ref 6.5–8.1)

## 2024-03-31 LAB — PROTIME-INR
INR: 1.4 — ABNORMAL HIGH (ref 0.8–1.2)
Prothrombin Time: 18.3 s — ABNORMAL HIGH (ref 11.4–15.2)

## 2024-03-31 LAB — CBC
HCT: 41.2 % (ref 36.0–46.0)
Hemoglobin: 13.7 g/dL (ref 12.0–15.0)
MCH: 31.3 pg (ref 26.0–34.0)
MCHC: 33.3 g/dL (ref 30.0–36.0)
MCV: 94.1 fL (ref 80.0–100.0)
Platelets: 257 K/uL (ref 150–400)
RBC: 4.38 MIL/uL (ref 3.87–5.11)
RDW: 13.3 % (ref 11.5–15.5)
WBC: 5.7 K/uL (ref 4.0–10.5)
nRBC: 0 % (ref 0.0–0.2)

## 2024-03-31 LAB — GLUCOSE, CAPILLARY
Glucose-Capillary: 112 mg/dL — ABNORMAL HIGH (ref 70–99)
Glucose-Capillary: 135 mg/dL — ABNORMAL HIGH (ref 70–99)

## 2024-03-31 LAB — MAGNESIUM: Magnesium: 1.3 mg/dL — ABNORMAL LOW (ref 1.7–2.4)

## 2024-03-31 LAB — HEMOGLOBIN A1C
Hgb A1c MFr Bld: 5.6 % (ref 4.8–5.6)
Mean Plasma Glucose: 114.02 mg/dL

## 2024-03-31 MED ORDER — OXYCODONE HCL 5 MG PO TABS
5.0000 mg | ORAL_TABLET | ORAL | Status: DC | PRN
  Administered 2024-03-31 – 2024-04-01 (×2): 5 mg via ORAL
  Filled 2024-03-31 (×2): qty 1

## 2024-03-31 MED ORDER — ONDANSETRON HCL 4 MG/2ML IJ SOLN
4.0000 mg | Freq: Four times a day (QID) | INTRAMUSCULAR | Status: DC | PRN
  Administered 2024-03-31 – 2024-04-01 (×3): 4 mg via INTRAVENOUS
  Filled 2024-03-31 (×3): qty 2

## 2024-03-31 MED ORDER — WARFARIN - PHARMACIST DOSING INPATIENT
Freq: Every day | Status: DC
  Filled 2024-03-31: qty 1

## 2024-03-31 MED ORDER — AMITRIPTYLINE HCL 10 MG PO TABS
10.0000 mg | ORAL_TABLET | Freq: Three times a day (TID) | ORAL | Status: DC | PRN
  Filled 2024-03-31: qty 1

## 2024-03-31 MED ORDER — HYDROMORPHONE HCL 1 MG/ML IJ SOLN
0.5000 mg | Freq: Once | INTRAMUSCULAR | Status: AC
  Administered 2024-03-31: 0.5 mg via INTRAVENOUS
  Filled 2024-03-31: qty 0.5

## 2024-03-31 MED ORDER — AMLODIPINE BESYLATE 10 MG PO TABS
10.0000 mg | ORAL_TABLET | Freq: Every day | ORAL | Status: DC
  Administered 2024-03-31 – 2024-04-01 (×2): 10 mg via ORAL
  Filled 2024-03-31: qty 1
  Filled 2024-03-31: qty 2

## 2024-03-31 MED ORDER — ACETAMINOPHEN 325 MG PO TABS: 650.0000 mg | ORAL_TABLET | Freq: Four times a day (QID) | ORAL | Status: DC | PRN

## 2024-03-31 MED ORDER — INSULIN ASPART 100 UNIT/ML IJ SOLN: 0.0000 [IU] | Freq: Every day | INTRAMUSCULAR | Status: DC

## 2024-03-31 MED ORDER — SODIUM CHLORIDE 0.9 % IV SOLN: 12.5000 mg | Freq: Four times a day (QID) | INTRAVENOUS | Status: DC | PRN

## 2024-03-31 MED ORDER — HYDROMORPHONE HCL 1 MG/ML IJ SOLN
1.0000 mg | Freq: Once | INTRAMUSCULAR | Status: AC
  Administered 2024-03-31: 1 mg via INTRAVENOUS
  Filled 2024-03-31: qty 1

## 2024-03-31 MED ORDER — LACTATED RINGERS IV BOLUS
1000.0000 mL | Freq: Once | INTRAVENOUS | Status: AC
  Administered 2024-03-31: 1000 mL via INTRAVENOUS

## 2024-03-31 MED ORDER — ALBUTEROL SULFATE (2.5 MG/3ML) 0.083% IN NEBU: 3.0000 mL | INHALATION_SOLUTION | Freq: Four times a day (QID) | RESPIRATORY_TRACT | Status: DC | PRN

## 2024-03-31 MED ORDER — TRAZODONE HCL 50 MG PO TABS
100.0000 mg | ORAL_TABLET | Freq: Every day | ORAL | Status: DC
  Administered 2024-03-31: 100 mg via ORAL
  Filled 2024-03-31: qty 2

## 2024-03-31 MED ORDER — ATORVASTATIN CALCIUM 20 MG PO TABS
40.0000 mg | ORAL_TABLET | Freq: Every day | ORAL | Status: DC
  Administered 2024-03-31 – 2024-04-01 (×2): 40 mg via ORAL
  Filled 2024-03-31 (×2): qty 2

## 2024-03-31 MED ORDER — BUPRENORPHINE HCL 2 MG SL SUBL
8.0000 mg | SUBLINGUAL_TABLET | Freq: Three times a day (TID) | SUBLINGUAL | Status: DC
  Administered 2024-04-01 (×2): 8 mg via SUBLINGUAL
  Filled 2024-03-31 (×3): qty 4

## 2024-03-31 MED ORDER — LOSARTAN POTASSIUM 50 MG PO TABS
50.0000 mg | ORAL_TABLET | Freq: Every day | ORAL | Status: DC
  Administered 2024-03-31 – 2024-04-01 (×2): 50 mg via ORAL
  Filled 2024-03-31 (×2): qty 1

## 2024-03-31 MED ORDER — ENOXAPARIN SODIUM 60 MG/0.6ML IJ SOSY
0.5000 mg/kg | PREFILLED_SYRINGE | INTRAMUSCULAR | Status: DC
  Administered 2024-03-31: 55 mg via SUBCUTANEOUS
  Filled 2024-03-31: qty 0.6

## 2024-03-31 MED ORDER — ONDANSETRON HCL 4 MG/2ML IJ SOLN
4.0000 mg | Freq: Once | INTRAMUSCULAR | Status: AC
  Administered 2024-03-31: 4 mg via INTRAVENOUS
  Filled 2024-03-31: qty 2

## 2024-03-31 MED ORDER — ACETAMINOPHEN 650 MG RE SUPP: 650.0000 mg | Freq: Four times a day (QID) | RECTAL | Status: DC | PRN

## 2024-03-31 MED ORDER — POLYETHYLENE GLYCOL 3350 17 G PO PACK: 17.0000 g | PACK | Freq: Every day | ORAL | Status: DC | PRN

## 2024-03-31 MED ORDER — MORPHINE SULFATE (PF) 2 MG/ML IV SOLN
2.0000 mg | INTRAVENOUS | Status: DC | PRN
  Administered 2024-03-31 – 2024-04-01 (×8): 2 mg via INTRAVENOUS
  Filled 2024-03-31 (×8): qty 1

## 2024-03-31 MED ORDER — SODIUM CHLORIDE 0.9 % IV SOLN: INTRAVENOUS | Status: AC

## 2024-03-31 MED ORDER — POTASSIUM CHLORIDE CRYS ER 20 MEQ PO TBCR
40.0000 meq | EXTENDED_RELEASE_TABLET | Freq: Once | ORAL | Status: DC
  Filled 2024-03-31: qty 2

## 2024-03-31 MED ORDER — MAGNESIUM SULFATE 2 GM/50ML IV SOLN
2.0000 g | Freq: Once | INTRAVENOUS | Status: AC
  Administered 2024-03-31: 2 g via INTRAVENOUS
  Filled 2024-03-31: qty 50

## 2024-03-31 MED ORDER — INSULIN ASPART 100 UNIT/ML IJ SOLN: 0.0000 [IU] | Freq: Three times a day (TID) | INTRAMUSCULAR | Status: DC

## 2024-03-31 MED ORDER — WARFARIN SODIUM 4 MG PO TABS
4.0000 mg | ORAL_TABLET | Freq: Once | ORAL | Status: AC
  Administered 2024-03-31: 4 mg via ORAL
  Filled 2024-03-31 (×2): qty 1

## 2024-03-31 MED ORDER — MAGNESIUM CHLORIDE 64 MG PO TBEC
1.0000 | DELAYED_RELEASE_TABLET | Freq: Every day | ORAL | Status: DC
  Administered 2024-03-31 – 2024-04-01 (×2): 64 mg via ORAL
  Filled 2024-03-31 (×3): qty 1

## 2024-03-31 MED ORDER — WARFARIN SODIUM 2.5 MG PO TABS: 3.7500 mg | ORAL_TABLET | Freq: Every day | ORAL | Status: DC

## 2024-03-31 NOTE — H&P (Signed)
 History and Physical    Sarah Sosa FMW:[id number] DOB: 12/04/1980 DOA: 03/31/2024  DOS: the patient was seen and examined on 03/31/2024  PCP: [facility]   Patient coming from: Home  I have personally briefly reviewed patient's old medical records in [facility]  Chief Complaint: Abdominal pain  HPI: Sarah Sosa is a pleasant 43 y.o. female with medical history significant for chronic pancreatitis, HTN, DM, ascending aortic aneurysm, aortic valve replacement on Coumadin , history of chronic alcohol use who presented to ED complaining of abdominal pain for 2 days.  Patient stated that she had this pain around epigastric region which is 10/10 in intensity, radiating to back, associated with nausea vomiting and diarrhea.  She describes her symptoms as similar to prior episodes of pancreatitis.  She denies recent use of alcohol.  She stated that she has been unable to keep anything down in the last 24 hours due to nausea and vomiting and abdominal pain.  She stated that she was not able to take her Coumadin  as she was vomiting.  She denies any fever or chills, chest pain, cough, palpitation, shortness of breath.  She denies any dysuria or flank pain.  She denies any hematemesis or melena.  ED Course: Upon arrival to the ED, patient is found to be hypertensive at 204/100, afebrile, hypokalemia, hypomagnesemia.  Her lipase was normal.  Hospitalist service was consulted for evaluation for admission for acute on chronic pancreatitis.  Review of Systems:  ROS  All other systems negative except as noted in the HPI.  Past Medical History:  Diagnosis Date   Arthritis    Asthma    Chronic pain disorder 03/19/2018   GERD (gastroesophageal reflux disease)    Headache    Heart murmur    History of trichomoniasis 03/19/2018   Hypertension    Intractable nausea and vomiting    Pancreatitis    PID (pelvic inflammatory disease) 02/14/2018   Uterine leiomyoma 03/19/2018    Past  Surgical History:  Procedure Laterality Date   ABDOMINAL HYSTERECTOMY     AORTIC VALVE REPLACEMENT  03/2022   CHOLECYSTECTOMY     DENTAL SURGERY     HYSTERECTOMY ABDOMINAL WITH SALPINGECTOMY Bilateral 04/15/2018   Procedure: HYSTERECTOMY ABDOMINAL WITH BILATERAL SALPINGECTOMY;  Surgeon: Connell Davies, MD;  Location: [facility] ORS;  Service: Gynecology;  Laterality: Bilateral;   kenn surgery Bilateral    KNEE ARTHROSCOPY Left 2012, 2013     reports that she has been smoking cigarettes. She has a 11 pack-year smoking history. She has never used smokeless tobacco. She reports that she does not currently use alcohol. She reports current drug use. Drug: Marijuana.  Allergies  Allergen Reactions   Latex Anaphylaxis, Hives and Itching    Other reaction(s): Other (See Comments) SKIN BURNING & PEELING    Nsaids Other (See Comments)    Serve ulcers; stomach aches, GERD.  Other Reaction(s): Other (See Comments)   Tomato Anaphylaxis, Hives and Itching   Gabapentin  Palpitations    Has heart condition; also makes jittery   Metformin      Other Reaction(s): Abdominal Pain  Causes pancreatitis to worsen   Lisinopril      Other Reaction(s): Angioedema   Tramadol Itching and Hives   Duloxetine Anxiety    Makes her jittery    Family History  Problem Relation Age of Onset   Fibroids Mother    Hypertension Mother    Arthritis Mother     Prior to Admission medications   Medication Sig Start Date  End Date Taking? Authorizing Provider  albuterol  (VENTOLIN  HFA) 108 (90 Base) MCG/ACT inhaler Inhale 1-2 puffs into the lungs every 6 (six) hours as needed. 07/26/23   [provider]  amitriptyline  (ELAVIL ) 10 MG tablet Take 10 mg by mouth 3 (three) times daily as needed for sleep (or pain). 11/02/22   [provider]  amLODipine  (NORVASC ) 10 MG tablet Take 1 tablet (10 mg total) by mouth daily. 07/19/23   Darci Pore, MD  atorvastatin  (LIPITOR) 40 MG tablet Take 40 mg by mouth  daily. 10/17/22   [provider]  buprenorphine  (SUBUTEX ) 8 MG SUBL SL tablet Place 8 mg under the tongue. Place 1 tablet (8 mg total) under the tongue 3 (three) times daily 01/11/24   [provider]  furosemide  (LASIX ) 40 MG tablet Take 0.5 tablets (20 mg total) by mouth daily as needed. Home med. 10/26/23   Awanda City, MD  lipase/protease/amylase (CREON ) 36000 UNITS CPEP capsule Take 1 capsule (36,000 Units total) by mouth 3 (three) times daily with meals. Patient not taking: Reported on 01/25/2024 08/13/23   Jhonny Calvin NOVAK, MD  losartan  (COZAAR ) 50 MG tablet Take 1 tablet (50 mg total) by mouth daily. 07/19/23   Darci Pore, MD  magnesium  chloride (SLOW-MAG) 64 MG TBEC SR tablet Take 1 tablet (64 mg total) by mouth daily. Patient not taking: Reported on 01/25/2024 12/14/23   Alexander, Natalie, DO  metFORMIN  (GLUCOPHAGE ) 500 MG tablet Take 500 mg by mouth every morning. 10/17/22   [provider]  naloxone  (NARCAN ) nasal spray 4 mg/0.1 mL Place 1 spray into the nose once as needed (to reverse overdose). 02/24/23   [provider]  omeprazole (PRILOSEC) 40 MG capsule Take 40 mg by mouth daily. 02/23/23   [provider]  ondansetron  (ZOFRAN ) 4 MG tablet Take 1 tablet (4 mg total) by mouth daily as needed for nausea or vomiting. 08/13/23 08/12/24  Jhonny Calvin NOVAK, MD  oxyCODONE  (OXY IR/ROXICODONE ) 5 MG immediate release tablet Take 1 tablet (5 mg total) by mouth every 4 (four) hours as needed for moderate pain (pain score 4-6) or severe pain (pain score 7-10). 01/30/24   Wouk, Devaughn Sayres, MD  spironolactone  (ALDACTONE ) 25 MG tablet Take 25 mg by mouth daily. 02/03/22   [provider]  traZODone  (DESYREL ) 50 MG tablet Take 100 mg by mouth at bedtime. 09/07/21   [provider]  warfarin (COUMADIN ) 7.5 MG tablet Take 0.5 tablets (3.75 mg total) by mouth daily. 12/14/23   Marsa Edelman, DO    Physical Exam: Vitals:   03/31/24  0251 03/31/24 0252 03/31/24 0715 03/31/24 0736  BP: (!) 204/100  (!) 162/98   Pulse: 75  72   Resp: 20  19   Temp: 98.8 F (37.1 C)   98.8 F (37.1 C)  TempSrc: Oral   Oral  SpO2: 98%  100%   Weight:  110.7 kg    Height:  5' 6 (1.676 m)      Physical Exam   Constitutional: Alert, awake, calm, comfortable HEENT: Neck supple Respiratory: Clear to auscultation B/L, no wheezing, no rales.  Cardiovascular: Regular rate and rhythm, no murmurs / rubs / gallops. No extremity edema. 2+ pedal pulses. No carotid bruits.  Abdomen: Soft, diffuse tenderness present but no rebound , Bowel sounds positive.  Musculoskeletal: no clubbing / cyanosis. Good ROM, no contractures. Normal muscle tone.  Skin: no rashes, lesions, ulcers. Neurologic: CN 2-12 grossly intact. Sensation intact, No focal deficit identified Psychiatric: Alert  and oriented x 3. Normal mood.    Labs on Admission: I have personally reviewed following labs and imaging studies  CBC: Recent Labs  Lab 03/31/24 0253  WBC 5.7  HGB 13.7  HCT 41.2  MCV 94.1  PLT 257   Basic Metabolic Panel: Recent Labs  Lab 03/31/24 0253  NA 139  K 3.1*  CL 104  CO2 24  GLUCOSE 121*  BUN 6  CREATININE 0.67  CALCIUM  8.8*  MG 1.3*   GFR: Estimated Creatinine Clearance: 115.5 mL/min (by C-G formula based on SCr of 0.67 mg/dL). Liver Function Tests: Recent Labs  Lab 03/31/24 0253  AST 92*  ALT 33  ALKPHOS 89  BILITOT 1.0  PROT 8.0  ALBUMIN 4.1   Recent Labs  Lab 03/31/24 0253  LIPASE 29   No results for input(s): AMMONIA in the last 168 hours. Coagulation Profile: Recent Labs  Lab 03/31/24 0253  INR 1.4*   Cardiac Enzymes: No results for input(s): CKTOTAL, CKMB, CKMBINDEX, TROPONINI, TROPONINIHS in the last 168 hours. BNP (last 3 results) No results for input(s): BNP in the last 8760 hours. HbA1C: No results for input(s): HGBA1C in the last 72 hours. CBG: No results for input(s): GLUCAP in the  last 168 hours. Lipid Profile: No results for input(s): CHOL, HDL, LDLCALC, TRIG, CHOLHDL, LDLDIRECT in the last 72 hours. Thyroid Function Tests: No results for input(s): TSH, T4TOTAL, FREET4, T3FREE, THYROIDAB in the last 72 hours. Anemia Panel: No results for input(s): VITAMINB12, FOLATE, FERRITIN, TIBC, IRON, RETICCTPCT in the last 72 hours. Urine analysis:    Component Value Date/Time   COLORURINE YELLOW (A) 01/25/2024 1530   APPEARANCEUR HAZY (A) 01/25/2024 1530   LABSPEC >1.046 (H) 01/25/2024 1530   PHURINE 6.0 01/25/2024 1530   GLUCOSEU NEGATIVE 01/25/2024 1530   HGBUR NEGATIVE 01/25/2024 1530   BILIRUBINUR NEGATIVE 01/25/2024 1530   KETONESUR 80 (A) 01/25/2024 1530   PROTEINUR 100 (A) 01/25/2024 1530   NITRITE NEGATIVE 01/25/2024 1530   LEUKOCYTESUR NEGATIVE 01/25/2024 1530    Radiological Exams on Admission: I have personally reviewed images No results found.  EKG: N/A    Assessment/Plan Principal Problem:   Acute on chronic pancreatitis [facility]) Active Problems:   Intractable nausea and vomiting   Hypokalemia   Alcohol abuse   Hypomagnesemia   Essential hypertension   Subtherapeutic international normalized ratio (INR)   S/p aortic valve replacement with 'On-X valve (INR target of 1.5 to 2) and ascending aortic graft ([facility], 03/2022)   DM2 (diabetes mellitus, type 2) (HCC)    Assessment and Plan: 43-year female admitted for abdominal pain due to acute on chronic pancreatitis.  Imaging has not been done and lipase have been normal.  1.  Abdominal pain nausea vomiting - May be related to acute on chronic pancreatitis - She does not have elevated lipase or imaging studies did not show any pancreatitis - She will be n.p.o., IV fluid, pain medications - Her pain will be monitored and if she is able to eat then we will reintroduce oral liquid diet. - Will give her antinausea medications  2.  Mechanical aortic valve - Continue  warfarin - Patient is subtherapeutic - Pharmacy to dose warfarin - Will give her a dose of Lovenox  to bridge  3.  Diabetes type 2 - Patient takes oral hypoglycemic medication metformin  - She will be on insulin  sliding scale while she is in the hospital  4.  HTN - Will continue to monitor blood pressure - Resume her home  medication but hold of diuretics Lasix  and spironolactone  today  5.  HLD - Continue Lipitor  6.  Chronic pain syndrome on buprenorphine  - Continue those medications  7.  Hypokalemia/hypomagnesemia - Magnesium  and potassium replaced - Will check potassium and magnesium  level    DVT prophylaxis: Lovenox  Code Status: Full Code Family Communication: None Disposition Plan: Home Consults called: None Admission status: Observation, Telemetry bed   Nena Rebel, MD [facility] 03/31/2024, 10:45 AM

## 2024-03-31 NOTE — Consult Note (Addendum)
 PHARMACY - ANTICOAGULATION CONSULT NOTE  Pharmacy Consult for Warfarin Indication:  aortic valve replacement  Allergies  Allergen Reactions   Latex Anaphylaxis, Hives and Itching    Other reaction(s): Other (See Comments) SKIN BURNING & PEELING    Nsaids Other (See Comments)    Serve ulcers; stomach aches, GERD.  Other Reaction(s): Other (See Comments)   Tomato Anaphylaxis, Hives and Itching   Gabapentin  Palpitations    Has heart condition; also makes jittery   Metformin      Other Reaction(s): Abdominal Pain  Causes pancreatitis to worsen   Lisinopril      Other Reaction(s): Angioedema   Tramadol Itching and Hives   Duloxetine Anxiety    Makes her jittery    Patient Measurements: Height: 5' 6 (167.6 cm) Weight: 110.7 kg (244 lb) IBW/kg (Calculated) : 59.3 HEPARIN  DW (KG): 85.1  Vital Signs: Temp: 98.8 F (37.1 C) (09/15 0736) Temp Source: Oral (09/15 0736) BP: 162/98 (09/15 0715) Pulse Rate: 72 (09/15 0715)  Labs: Recent Labs    03/31/24 0253  HGB 13.7  HCT 41.2  PLT 257  LABPROT 18.3*  INR 1.4*  CREATININE 0.67    Estimated Creatinine Clearance: 115.5 mL/min (by C-G formula based on SCr of 0.67 mg/dL).   Medical History: Past Medical History:  Diagnosis Date   Arthritis    Asthma    Chronic pain disorder 03/19/2018   GERD (gastroesophageal reflux disease)    Headache    Heart murmur    History of trichomoniasis 03/19/2018   Hypertension    Intractable nausea and vomiting    Pancreatitis    PID (pelvic inflammatory disease) 02/14/2018   Uterine leiomyoma 03/19/2018    Medications:  (Not in a hospital admission)  Scheduled:   amLODipine   10 mg Oral Daily   atorvastatin   40 mg Oral Daily   buprenorphine   8 mg Sublingual TID AC & HS   enoxaparin  (LOVENOX ) injection  0.5 mg/kg Subcutaneous Q24H   insulin  aspart  0-5 Units Subcutaneous QHS   insulin  aspart  0-9 Units Subcutaneous TID WC   losartan   50 mg Oral Daily   magnesium  chloride  1  tablet Oral Daily   potassium chloride   40 mEq Oral Once   traZODone   100 mg Oral QHS   warfarin  3.75 mg Oral Daily   Infusions:   sodium chloride      PRN: acetaminophen  **OR** acetaminophen , albuterol , amitriptyline , morphine  injection, oxyCODONE , polyethylene glycol Anti-infectives (From admission, onward)    None       Assessment: 43 y.o. female with medical history significant for chronic pancreatitis, HTN, DM, ascending aortic aneurysm, aortic valve replacement on Coumadin , history of chronic alcohol use who presented to ED complaining of abdominal pain for 2 days. Per last [facility] note (01/2024): Continue warfarin 3.75 mg daily (TWD 26.25 mg). CBC stable. DDI: enoxaparin  0.5 mg/kg daily for DVT ppx.   Date INR Warfarin Dose  9/15 1.4 3.75 mg          Goal of Therapy:  On-X valve (INR target of 1.5 to 2) Monitor platelets by anticoagulation protocol: Yes   Plan:  INR is slightly subtherapeutic. Will give warfarin 4 mg (close to home dose). Daily INR. CBC at least every 7 days. Pt has DVT ppx enoxaparin  ordered. Discontinue once INR > 1.5.   Cathaleen GORMAN Blanch, PharmD, BCPS 03/31/2024,10:47 AM

## 2024-03-31 NOTE — ED Provider Notes (Signed)
 [facility] Provider Note    Event Date/Time   First MD Initiated Contact with Patient 03/31/24 [phone]     (approximate)   History   Chief Complaint Abdominal Pain   HPI  Sarah Sosa is a 43 y.o. female with past medical history of hypertension, diabetes, ascending aortic aneurysm, aortic valve replacement on Coumadin , pancreatitis, and alcohol abuse who presents to the ED complaining of abdominal pain.  Patient reports that she has had about 2 days of increasing epigastric pain along with nausea, vomiting, diarrhea.  She describes her symptoms as similar to prior episodes of pancreatitis, denies recent alcohol abuse.  She states that she has been unable to keep anything down in the past 24 hours, including her Coumadin .  She denies any fevers, dysuria, or flank pain.  She has not had any blood in her emesis or stool.     Physical Exam   Triage Vital Signs: ED Triage Vitals  Encounter Vitals Group     BP 03/31/24 0251 (!) 204/100     Girls Systolic BP Percentile --      Girls Diastolic BP Percentile --      Boys Systolic BP Percentile --      Boys Diastolic BP Percentile --      Pulse Rate 03/31/24 0251 75     Resp 03/31/24 0251 20     Temp 03/31/24 0251 98.8 F (37.1 C)     Temp Source 03/31/24 0251 Oral     SpO2 03/31/24 0251 98 %     Weight 03/31/24 0252 244 lb (110.7 kg)     Height 03/31/24 0252 5' 6 (1.676 m)     Head Circumference --      Peak Flow --      Pain Score 03/31/24 0252 10     Pain Loc --      Pain Education --      Exclude from Growth Chart --     Most recent vital signs: Vitals:   03/31/24 0715 03/31/24 0736  BP: (!) 162/98   Pulse: 72   Resp: 19   Temp:  98.8 F (37.1 C)  SpO2: 100%     Constitutional: Alert and oriented. Eyes: Conjunctivae are normal. Head: Atraumatic. Nose: No congestion/rhinnorhea. Mouth/Throat: Mucous membranes are moist.  Cardiovascular: Normal rate, regular rhythm. Grossly normal heart  sounds.  2+ radial pulses bilaterally. Respiratory: Normal respiratory effort.  No retractions. Lungs CTAB. Gastrointestinal: Soft and tender to palpation in the upper abdomen diffusely without rebound or guarding. No distention. Musculoskeletal: No lower extremity tenderness nor edema.  Neurologic:  Normal speech and language. No gross focal neurologic deficits are appreciated.    ED Results / Procedures / Treatments   Labs (all labs ordered are listed, but only abnormal results are displayed) Labs Reviewed  COMPREHENSIVE METABOLIC PANEL WITH GFR - Abnormal; Notable for the following components:      Result Value   Potassium 3.1 (*)    Glucose, Bld 121 (*)    Calcium  8.8 (*)    AST 92 (*)    All other components within normal limits  MAGNESIUM  - Abnormal; Notable for the following components:   Magnesium  1.3 (*)    All other components within normal limits  PROTIME-INR - Abnormal; Notable for the following components:   Prothrombin Time 18.3 (*)    INR 1.4 (*)    All other components within normal limits  LIPASE, BLOOD  CBC  URINALYSIS, ROUTINE W REFLEX  MICROSCOPIC    PROCEDURES:  Critical Care performed: No  Procedures   MEDICATIONS ORDERED IN ED: Medications  potassium chloride  SA (KLOR-CON  M) CR tablet 40 mEq (40 mEq Oral Not Given 03/31/24 0820)  HYDROmorphone  (DILAUDID ) injection 1 mg (1 mg Intravenous Given 03/31/24 0820)  ondansetron  (ZOFRAN ) injection 4 mg (4 mg Intravenous Given 03/31/24 0820)  lactated ringers  bolus 1,000 mL (1,000 mLs Intravenous New Bag/Given 03/31/24 0821)  magnesium  sulfate IVPB 2 g 50 mL (0 g Intravenous Stopped 03/31/24 0920)  HYDROmorphone  (DILAUDID ) injection 0.5 mg (0.5 mg Intravenous Given 03/31/24 0933)     IMPRESSION / MDM / ASSESSMENT AND PLAN / ED COURSE  I reviewed the triage vital signs and the nursing notes.                              43 y.o. female with past medical history of hypertension, diabetes, ascending aortic  aneurysm, aortic valve replacement on Coumadin , recurrent pancreatitis, and alcohol abuse who presents to the ED complaining of 48 hours of epigastric pain with nausea, vomiting, and diarrhea.  Patient's presentation is most consistent with acute presentation with potential threat to life or bodily function.  Differential diagnosis includes, but is not limited to, gastritis, gastroenteritis, dehydration, electrolyte abnormality, AKI, pancreatitis, hepatitis, UTI.  Patient nontoxic-appearing and in no acute distress, vital signs remarkable for hypertension but otherwise reassuring.  Patient's abdomen is soft but she does have tenderness across her upper abdomen, greatest in the epigastrium.  She describes symptoms as similar to prior episodes of pancreatitis, labs are significant for mild hypokalemia but no AKI, anemia, leukocytosis, or LFT abnormality noted.  Lipase within normal limits, but patient with history of chronic pancreatitis.  We will treat symptomatically with IV Dilaudid  and Zofran , hydrate with IV fluids.  Do not feel CT imaging indicated at this time as I suspect acute on chronic pancreatitis and she has had 6 abdominal CTs so far this year showing uncomplicated pancreatitis.  Plan to reassess following symptomatic management.  Labs with subtherapeutic INR at 1.4, although patient informs me that her goal INR is 1.5-2.  She continues to have significant pain and nausea, unable to tolerate her medications.  Case discussed with hospitalist for admission.      FINAL CLINICAL IMPRESSION(S) / ED DIAGNOSES   Final diagnoses:  Chronic pancreatitis, unspecified pancreatitis type (HCC)  Intractable nausea and vomiting     Rx / DC Orders   ED Discharge Orders     None        Note:  This document was prepared using Dragon voice recognition software and may include unintentional dictation errors.   Willo Dunnings, MD 03/31/24 [phone]

## 2024-03-31 NOTE — ED Notes (Signed)
 Blue top sent to the lab at this time.

## 2024-03-31 NOTE — ED Triage Notes (Signed)
 Patient ambulatory to triage with complaints of worsening LLQ abdominal pain starting earlier today with vomiting and diarrhea. Patient endorses hx of pancreatitis.

## 2024-03-31 NOTE — ED Notes (Signed)
 Multiple IV attempts, pt is a difficult stick.

## 2024-04-01 ENCOUNTER — Other Ambulatory Visit: Payer: Self-pay

## 2024-04-01 DIAGNOSIS — K861 Other chronic pancreatitis: Secondary | ICD-10-CM | POA: Diagnosis not present

## 2024-04-01 DIAGNOSIS — K859 Acute pancreatitis without necrosis or infection, unspecified: Secondary | ICD-10-CM | POA: Diagnosis not present

## 2024-04-01 LAB — COMPREHENSIVE METABOLIC PANEL WITH GFR
ALT: 20 U/L (ref 0–44)
AST: 29 U/L (ref 15–41)
Albumin: 3.3 g/dL — ABNORMAL LOW (ref 3.5–5.0)
Alkaline Phosphatase: 66 U/L (ref 38–126)
Anion gap: 12 (ref 5–15)
BUN: 6 mg/dL (ref 6–20)
CO2: 25 mmol/L (ref 22–32)
Calcium: 8.6 mg/dL — ABNORMAL LOW (ref 8.9–10.3)
Chloride: 103 mmol/L (ref 98–111)
Creatinine, Ser: 0.62 mg/dL (ref 0.44–1.00)
GFR, Estimated: >60 mL/min (ref >60)
Glucose, Bld: 88 mg/dL (ref 70–99)
Potassium: 3.3 mmol/L — ABNORMAL LOW (ref 3.5–5.1)
Sodium: 140 mmol/L (ref 135–145)
Total Bilirubin: 1.2 mg/dL (ref 0.0–1.2)
Total Protein: 6.6 g/dL (ref 6.5–8.1)

## 2024-04-01 LAB — GLUCOSE, CAPILLARY
Glucose-Capillary: 85 mg/dL (ref 70–99)
Glucose-Capillary: 100 mg/dL — ABNORMAL HIGH (ref 70–99)

## 2024-04-01 LAB — CBC
HCT: 36.5 % (ref 36.0–46.0)
Hemoglobin: 11.8 g/dL — ABNORMAL LOW (ref 12.0–15.0)
MCH: 30.8 pg (ref 26.0–34.0)
MCHC: 32.3 g/dL (ref 30.0–36.0)
MCV: 95.3 fL (ref 80.0–100.0)
Platelets: 226 K/uL (ref 150–400)
RBC: 3.83 MIL/uL — ABNORMAL LOW (ref 3.87–5.11)
RDW: 13.1 % (ref 11.5–15.5)
WBC: 4.1 K/uL (ref 4.0–10.5)
nRBC: 0 % (ref 0.0–0.2)

## 2024-04-01 LAB — PROTIME-INR
INR: 1.4 — ABNORMAL HIGH (ref 0.8–1.2)
Prothrombin Time: 18.1 s — ABNORMAL HIGH (ref 11.4–15.2)

## 2024-04-01 LAB — MAGNESIUM: Magnesium: 1.4 mg/dL — ABNORMAL LOW (ref 1.7–2.4)

## 2024-04-01 MED ORDER — POTASSIUM CHLORIDE 10 MEQ/100ML IV SOLN
10.0000 meq | INTRAVENOUS | Status: AC
  Administered 2024-04-01 (×2): 10 meq via INTRAVENOUS
  Filled 2024-04-01 (×2): qty 100

## 2024-04-01 MED ORDER — MAGNESIUM SULFATE 4 GM/100ML IV SOLN
4.0000 g | Freq: Once | INTRAVENOUS | Status: AC
  Administered 2024-04-01: 4 g via INTRAVENOUS
  Filled 2024-04-01: qty 100

## 2024-04-01 MED ORDER — OXYCODONE HCL 5 MG PO TABS
5.0000 mg | ORAL_TABLET | Freq: Four times a day (QID) | ORAL | 0 refills | Status: DC | PRN
  Filled 2024-04-01: qty 20, 5d supply, fill #0

## 2024-04-01 MED ORDER — WARFARIN SODIUM 4 MG PO TABS
4.0000 mg | ORAL_TABLET | Freq: Once | ORAL | Status: AC
  Administered 2024-04-01: 4 mg via ORAL
  Filled 2024-04-01: qty 1

## 2024-04-01 NOTE — Consult Note (Signed)
 PHARMACY - ANTICOAGULATION CONSULT NOTE  Pharmacy Consult for Warfarin Indication:  aortic valve replacement  Allergies  Allergen Reactions   Latex Anaphylaxis, Hives and Itching    Other reaction(s): Other (See Comments) SKIN BURNING & PEELING    Nsaids Other (See Comments)    Serve ulcers; stomach aches, GERD.  Other Reaction(s): Other (See Comments)   Tomato Anaphylaxis, Hives and Itching   Gabapentin  Palpitations    Has heart condition; also makes jittery   Metformin      Other Reaction(s): Abdominal Pain  Causes pancreatitis to worsen   Lisinopril      Other Reaction(s): Angioedema   Semaglutide     Other Reaction(s): Other (See Comments)    Acute on chronic pancreatitis 09/2023   Tramadol Itching and Hives   Duloxetine Anxiety    Makes her jittery    Patient Measurements: Height: 5' 6 (167.6 cm) Weight: 110.7 kg (244 lb) IBW/kg (Calculated) : 59.3 HEPARIN  DW (KG): 85.1  Vital Signs: Temp: 99 F (37.2 C) (09/16 0554) Temp Source: Oral (09/16 0554) BP: 155/75 (09/16 0554) Pulse Rate: 58 (09/16 0554)  Labs: Recent Labs    03/31/24 0253 04/01/24 0336  HGB 13.7 11.8*  HCT 41.2 36.5  PLT 257 226  LABPROT 18.3* 18.1*  INR 1.4* 1.4*  CREATININE 0.67 0.62    Estimated Creatinine Clearance: 115.5 mL/min (by C-G formula based on SCr of 0.62 mg/dL).   Medical History: Past Medical History:  Diagnosis Date   Arthritis    Asthma    Chronic pain disorder 03/19/2018   GERD (gastroesophageal reflux disease)    Headache    Heart murmur    History of trichomoniasis 03/19/2018   Hypertension    Intractable nausea and vomiting    Pancreatitis    PID (pelvic inflammatory disease) 02/14/2018   Uterine leiomyoma 03/19/2018    Medications:  Medications Prior to Admission  Medication Sig Dispense Refill Last Dose/Taking   albuterol  (VENTOLIN  HFA) 108 (90 Base) MCG/ACT inhaler Inhale 1-2 puffs into the lungs every 6 (six) hours as needed.   Unknown    amitriptyline  (ELAVIL ) 10 MG tablet Take 10 mg by mouth 3 (three) times daily as needed for sleep (or pain).   03/29/2024   amLODipine  (NORVASC ) 10 MG tablet Take 1 tablet (10 mg total) by mouth daily. 30 tablet 2 03/29/2024   atorvastatin  (LIPITOR) 40 MG tablet Take 40 mg by mouth daily.   03/29/2024   buprenorphine  (SUBUTEX ) 8 MG SUBL SL tablet Place 8 mg under the tongue. Place 1 tablet (8 mg total) under the tongue 3 (three) times daily   03/30/2024   furosemide  (LASIX ) 40 MG tablet Take 0.5 tablets (20 mg total) by mouth daily as needed. Home med. (Patient taking differently: Take 40 mg by mouth daily as needed for edema. Home med.)   03/29/2024   lipase/protease/amylase (CREON ) 36000 UNITS CPEP capsule Take 1 capsule (36,000 Units total) by mouth 3 (three) times daily with meals. 90 capsule 0 Unknown   losartan  (COZAAR ) 50 MG tablet Take 1 tablet (50 mg total) by mouth daily. 30 tablet 2 03/29/2024   metFORMIN  (GLUCOPHAGE ) 500 MG tablet Take 500 mg by mouth every morning.   03/29/2024   naloxone  (NARCAN ) nasal spray 4 mg/0.1 mL Place 1 spray into the nose once as needed (to reverse overdose).   Taking As Needed   omeprazole (PRILOSEC) 40 MG capsule Take 40 mg by mouth daily.   03/29/2024   ondansetron  (ZOFRAN ) 4 MG tablet Take 1  tablet (4 mg total) by mouth daily as needed for nausea or vomiting. 30 tablet 1 Unknown   ondansetron  (ZOFRAN -ODT) 4 MG disintegrating tablet Take 4 mg by mouth every 8 (eight) hours as needed for nausea.   Taking As Needed   oxyCODONE  (OXY IR/ROXICODONE ) 5 MG immediate release tablet Take 1 tablet (5 mg total) by mouth every 4 (four) hours as needed for moderate pain (pain score 4-6) or severe pain (pain score 7-10). 15 tablet 0 03/29/2024   potassium chloride  20 MEQ/15ML (10%) SOLN Take 20 mEq by mouth 2 (two) times daily.   Taking   spironolactone  (ALDACTONE ) 25 MG tablet Take 25 mg by mouth daily.   03/29/2024   traZODone  (DESYREL ) 50 MG tablet Take 100 mg by mouth at  bedtime.   03/29/2024   warfarin (COUMADIN ) 7.5 MG tablet Take 0.5 tablets (3.75 mg total) by mouth daily.   03/29/2024   magnesium  chloride (SLOW-MAG) 64 MG TBEC SR tablet Take 1 tablet (64 mg total) by mouth daily. (Patient not taking: Reported on 01/25/2024)      Scheduled:   amLODipine   10 mg Oral Daily   atorvastatin   40 mg Oral Daily   buprenorphine   8 mg Sublingual TID   enoxaparin  (LOVENOX ) injection  0.5 mg/kg Subcutaneous Q24H   insulin  aspart  0-5 Units Subcutaneous QHS   insulin  aspart  0-9 Units Subcutaneous TID WC   losartan   50 mg Oral Daily   magnesium  chloride  1 tablet Oral Daily   potassium chloride   40 mEq Oral Once   traZODone   100 mg Oral QHS   Warfarin - Pharmacist Dosing Inpatient   Does not apply q1600   Infusions:   sodium chloride  125 mL/hr at 03/31/24 2329   promethazine  (PHENERGAN ) injection (IM or IVPB)     PRN: acetaminophen  **OR** acetaminophen , albuterol , morphine  injection, ondansetron  (ZOFRAN ) IV, oxyCODONE , polyethylene glycol, promethazine  (PHENERGAN ) injection (IM or IVPB) Anti-infectives (From admission, onward)    None       Assessment: 43 y.o. female with medical history significant for chronic pancreatitis, HTN, DM, ascending aortic aneurysm, aortic valve replacement on Coumadin , history of chronic alcohol use who presented to ED complaining of abdominal pain for 2 days. Per last [facility] note (01/2024): Continue warfarin 3.75 mg daily (TWD 26.25 mg). CBC stable. DDI: enoxaparin  0.5 mg/kg daily for DVT ppx.   Date INR Warfarin Dose  9/15 1.4 4 mg  9/16 1.4 4 mg    Goal of Therapy:  On-X valve (INR target of 1.5 to 2) Monitor platelets by anticoagulation protocol: Yes   Plan:  INR remains slightly subtherapeutic this AM, Hgb 11.8, plt 226 Give warfarin 4 mg PO x1 this afternoon Continue enoxaparin  55 mg (0.5 mg/kg) SQ daily for DVT prophylaxis until INR > 1.5 Monitor INR daily while inpatient Monitor CBC at least weekly  Thank you for  involving pharmacy in this patient's care.   Damien Napoleon, PharmD Clinical Pharmacist 04/01/2024 7:16 AM

## 2024-04-01 NOTE — Discharge Summary (Signed)
 Physician Discharge Summary   Sarah Sosa  female DOB: 10-11-1980  FMW:[id number]  PCP: [facility]  Admit date: 03/31/2024 Discharge date: 04/01/2024  Admitted From: home Disposition:  home CODE STATUS: Full code   Hospital Course:  For full details, please see H&P, progress notes, consult notes and ancillary notes.  Briefly,  Sarah Sosa is a pleasant 43 y.o. female with medical history significant for chronic pancreatitis, HTN, DM, ascending aortic aneurysm, aortic valve replacement on Coumadin , history of chronic alcohol use who presented to ED complaining of abdominal pain for 2 days associated with nausea vomiting and diarrhea.    She described her symptoms as similar to prior episodes of pancreatitis.  Of note, pt has had 8 hospitalizations for abdominal pain in 2025.  Abdominal pain nausea vomiting --lipase wnl.  No witnessed N/V.  Pt requested and received IV dilaudid  x2 + IV morphine  x8 in the first 24 hours of presentation.  IV opioids d/c'ed the day after admission, and then pt reported symptoms improved and asked to advance diet.  Pt tolerated low-fat diet and asked for discharge.   Chronic pain syndrome Opioid use disorder with pain med seeking behaviors --pt has current Rx for buprenorphine  and oxycodone .  At first, pt claimed she had nausea with buprenorphine  and therefore was asking for IV morphine , however, after IV morphine  was d/c'ed, pt had no problem taking buprenorphine . --Pt said she had run out of her home oxycodone , and won't see her prescriber for another week.  Pt said she would like to go home if she could get some oxycodone  to tie her over, so oxycodone  5 mg x20 tabs prescribed at discharge. --pt has had monthly admissions for abdominal pain and asked for large amount of IV opioids, with preference for IV dilaudid .    Mechanical aortic valve - Continue home warfarin  Diabetes type 2 --A1c 5.6. --resume home metformin  after  discharge.  HTN --cont home regimen as below   HLD - Continue Lipitor   Hypokalemia/hypomagnesemia --supplemented with IV potassium and IV mag   Discharge Diagnoses:  Principal Problem:   Acute on chronic pancreatitis [facility]) Active Problems:   Intractable nausea and vomiting   Hypokalemia   Alcohol abuse   Hypomagnesemia   Essential hypertension   Subtherapeutic international normalized ratio (INR)   S/p aortic valve replacement with 'On-X valve (INR target of 1.5 to 2) and ascending aortic graft ([facility], 03/2022)   DM2 (diabetes mellitus, type 2) (HCC)     Discharge Instructions:  Allergies as of 04/01/2024       Reactions   Latex Anaphylaxis, Hives, Itching   Other reaction(s): Other (See Comments) SKIN BURNING & PEELING   Nsaids Other (See Comments)   Serve ulcers; stomach aches, GERD. Other Reaction(s): Other (See Comments)   Tomato Anaphylaxis, Hives, Itching   Gabapentin  Palpitations   Has heart condition; also makes jittery   Metformin     Other Reaction(s): Abdominal Pain Causes pancreatitis to worsen   Lisinopril     Other Reaction(s): Angioedema   Semaglutide    Other Reaction(s): Other (See Comments)    Acute on chronic pancreatitis 09/2023   Tramadol Itching, Hives   Duloxetine Anxiety   Makes her jittery        Medication List     TAKE these medications    albuterol  108 (90 Base) MCG/ACT inhaler Commonly known as: VENTOLIN  HFA Inhale 1-2 puffs into the lungs every 6 (six) hours as needed.   amitriptyline  10 MG tablet Commonly  known as: ELAVIL  Take 10 mg by mouth 3 (three) times daily as needed for sleep (or pain).   amLODipine  10 MG tablet Commonly known as: NORVASC  Take 1 tablet (10 mg total) by mouth daily.   atorvastatin  40 MG tablet Commonly known as: LIPITOR Take 40 mg by mouth daily.   buprenorphine  8 MG Subl SL tablet Commonly known as: SUBUTEX  Place 8 mg under the tongue. Place 1 tablet (8 mg total) under the tongue 3  (three) times daily   furosemide  40 MG tablet Commonly known as: LASIX  Take 0.5 tablets (20 mg total) by mouth daily as needed. Home med. What changed:  how much to take reasons to take this   lipase/protease/amylase 63999 UNITS Cpep capsule Commonly known as: CREON  Take 1 capsule (36,000 Units total) by mouth 3 (three) times daily with meals.   losartan  50 MG tablet Commonly known as: COZAAR  Take 1 tablet (50 mg total) by mouth daily.   magnesium  chloride 64 MG Tbec SR tablet Commonly known as: SLOW-MAG Take 1 tablet (64 mg total) by mouth daily.   metFORMIN  500 MG tablet Commonly known as: GLUCOPHAGE  Take 500 mg by mouth every morning.   naloxone  4 MG/0.1ML Liqd nasal spray kit Commonly known as: NARCAN  Place 1 spray into the nose once as needed (to reverse overdose).   omeprazole 40 MG capsule Commonly known as: PRILOSEC Take 40 mg by mouth daily.   ondansetron  4 MG disintegrating tablet Commonly known as: ZOFRAN -ODT Take 4 mg by mouth every 8 (eight) hours as needed for nausea.   ondansetron  4 MG tablet Commonly known as: Zofran  Take 1 tablet (4 mg total) by mouth daily as needed for nausea or vomiting.   oxyCODONE  5 MG immediate release tablet Commonly known as: Oxy IR/ROXICODONE  Take 1 tablet (5 mg total) by mouth every 6 (six) hours as needed for moderate pain (pain score 4-6) or severe pain (pain score 7-10). What changed: when to take this   potassium chloride  20 MEQ/15ML (10%) Soln Take 20 mEq by mouth 2 (two) times daily.   spironolactone  25 MG tablet Commonly known as: ALDACTONE  Take 25 mg by mouth daily.   traZODone  50 MG tablet Commonly known as: DESYREL  Take 100 mg by mouth at bedtime.   warfarin 7.5 MG tablet Commonly known as: COUMADIN  Take 0.5 tablets (3.75 mg total) by mouth daily.         Follow-up Information     [facility].   Why: hospital follow up Contact information: [address]  [place] [address] [phone]                 Allergies  Allergen Reactions   Latex Anaphylaxis, Hives and Itching    Other reaction(s): Other (See Comments) SKIN BURNING & PEELING    Nsaids Other (See Comments)    Serve ulcers; stomach aches, GERD.  Other Reaction(s): Other (See Comments)   Tomato Anaphylaxis, Hives and Itching   Gabapentin  Palpitations    Has heart condition; also makes jittery   Metformin      Other Reaction(s): Abdominal Pain  Causes pancreatitis to worsen   Lisinopril      Other Reaction(s): Angioedema   Semaglutide     Other Reaction(s): Other (See Comments)    Acute on chronic pancreatitis 09/2023   Tramadol Itching and Hives   Duloxetine Anxiety    Makes her jittery     The results of significant diagnostics from this hospitalization (including imaging,  microbiology, ancillary and laboratory) are listed below for reference.   Consultations:   Procedures/Studies: No results found.    Labs: BNP (last 3 results) No results for input(s): BNP in the last 8760 hours. Basic Metabolic Panel: Recent Labs  Lab 03/31/24 0253 04/01/24 0336  NA 139 140  K 3.1* 3.3*  CL 104 103  CO2 24 25  GLUCOSE 121* 88  BUN 6 6  CREATININE 0.67 0.62  CALCIUM  8.8* 8.6*  MG 1.3* 1.4*   Liver Function Tests: Recent Labs  Lab 03/31/24 0253 04/01/24 0336  AST 92* 29  ALT 33 20  ALKPHOS 89 66  BILITOT 1.0 1.2  PROT 8.0 6.6  ALBUMIN 4.1 3.3*   Recent Labs  Lab 03/31/24 0253  LIPASE 29   No results for input(s): AMMONIA in the last 168 hours. CBC: Recent Labs  Lab 03/31/24 0253 04/01/24 0336  WBC 5.7 4.1  HGB 13.7 11.8*  HCT 41.2 36.5  MCV 94.1 95.3  PLT 257 226   Cardiac Enzymes: No results for input(s): CKTOTAL, CKMB, CKMBINDEX, TROPONINI in the last 168 hours. BNP: Invalid input(s): POCBNP CBG: Recent Labs  Lab 03/31/24 1640 03/31/24 2114 04/01/24 0802 04/01/24 1146  GLUCAP 112* 135* 85 100*    D-Dimer No results for input(s): DDIMER in the last 72 hours. Hgb A1c Recent Labs    03/31/24 0253  HGBA1C 5.6   Lipid Profile No results for input(s): CHOL, HDL, LDLCALC, TRIG, CHOLHDL, LDLDIRECT in the last 72 hours. Thyroid function studies No results for input(s): TSH, T4TOTAL, T3FREE, THYROIDAB in the last 72 hours.  Invalid input(s): FREET3 Anemia work up No results for input(s): VITAMINB12, FOLATE, FERRITIN, TIBC, IRON, RETICCTPCT in the last 72 hours. Urinalysis    Component Value Date/Time   COLORURINE YELLOW (A) 01/25/2024 1530   APPEARANCEUR HAZY (A) 01/25/2024 1530   LABSPEC >1.046 (H) 01/25/2024 1530   PHURINE 6.0 01/25/2024 1530   GLUCOSEU NEGATIVE 01/25/2024 1530   HGBUR NEGATIVE 01/25/2024 1530   BILIRUBINUR NEGATIVE 01/25/2024 1530   KETONESUR 80 (A) 01/25/2024 1530   PROTEINUR 100 (A) 01/25/2024 1530   NITRITE NEGATIVE 01/25/2024 1530   LEUKOCYTESUR NEGATIVE 01/25/2024 1530   Sepsis Labs Recent Labs  Lab 03/31/24 0253 04/01/24 0336  WBC 5.7 4.1   Microbiology No results found for this or any previous visit (from the past 240 hours).   Total time spend on discharging this patient, including the last patient exam, discussing the hospital stay, instructions for ongoing care as it relates to all pertinent caregivers, as well as preparing the medical discharge records, prescriptions, and/or referrals as applicable, is 50 minutes.    Ellouise Haber, MD  [facility] 04/01/2024, 3:51 PM

## 2024-04-01 NOTE — Plan of Care (Signed)
   Problem: Education: Goal: Knowledge of General Education information will improve Description: Including pain rating scale, medication(s)/side effects and non-pharmacologic comfort measures Outcome: Progressing   Problem: Pain Managment: Goal: General experience of comfort will improve and/or be controlled Outcome: Progressing   Problem: Safety: Goal: Ability to remain free from injury will improve Outcome: Progressing

## 2024-04-02 ENCOUNTER — Other Ambulatory Visit: Payer: Self-pay

## 2024-04-27 ENCOUNTER — Encounter: Payer: Self-pay | Admitting: Emergency Medicine

## 2024-04-27 ENCOUNTER — Other Ambulatory Visit: Payer: Self-pay

## 2024-04-27 ENCOUNTER — Inpatient Hospital Stay
Admission: EM | Admit: 2024-04-27 | Discharge: 2024-05-02 | DRG: 439 | Disposition: A | Discharge status: Home / Self Care | Payer: MEDICAID

## 2024-04-27 DIAGNOSIS — Z91040 Latex allergy status: Secondary | ICD-10-CM

## 2024-04-27 DIAGNOSIS — Z8249 Family history of ischemic heart disease and other diseases of the circulatory system: Secondary | ICD-10-CM

## 2024-04-27 DIAGNOSIS — Z7901 Long term (current) use of anticoagulants: Secondary | ICD-10-CM | POA: Diagnosis not present

## 2024-04-27 DIAGNOSIS — Z91148 Patient's other noncompliance with medication regimen for other reason: Secondary | ICD-10-CM

## 2024-04-27 DIAGNOSIS — Z79899 Other long term (current) drug therapy: Secondary | ICD-10-CM | POA: Diagnosis not present

## 2024-04-27 DIAGNOSIS — R791 Abnormal coagulation profile: Secondary | ICD-10-CM | POA: Diagnosis present

## 2024-04-27 DIAGNOSIS — E119 Type 2 diabetes mellitus without complications: Secondary | ICD-10-CM | POA: Diagnosis present

## 2024-04-27 DIAGNOSIS — I5030 Unspecified diastolic (congestive) heart failure: Secondary | ICD-10-CM | POA: Diagnosis present

## 2024-04-27 DIAGNOSIS — Z91018 Allergy to other foods: Secondary | ICD-10-CM

## 2024-04-27 DIAGNOSIS — K219 Gastro-esophageal reflux disease without esophagitis: Secondary | ICD-10-CM | POA: Diagnosis present

## 2024-04-27 DIAGNOSIS — Z888 Allergy status to other drugs, medicaments and biological substances status: Secondary | ICD-10-CM

## 2024-04-27 DIAGNOSIS — R112 Nausea with vomiting, unspecified: Secondary | ICD-10-CM | POA: Diagnosis present

## 2024-04-27 DIAGNOSIS — E785 Hyperlipidemia, unspecified: Secondary | ICD-10-CM | POA: Diagnosis present

## 2024-04-27 DIAGNOSIS — Z87892 Personal history of anaphylaxis: Secondary | ICD-10-CM

## 2024-04-27 DIAGNOSIS — K860 Alcohol-induced chronic pancreatitis: Secondary | ICD-10-CM | POA: Diagnosis present

## 2024-04-27 DIAGNOSIS — K861 Other chronic pancreatitis: Secondary | ICD-10-CM | POA: Diagnosis not present

## 2024-04-27 DIAGNOSIS — Z952 Presence of prosthetic heart valve: Secondary | ICD-10-CM

## 2024-04-27 DIAGNOSIS — Z79891 Long term (current) use of opiate analgesic: Secondary | ICD-10-CM | POA: Diagnosis not present

## 2024-04-27 DIAGNOSIS — K859 Acute pancreatitis without necrosis or infection, unspecified: Secondary | ICD-10-CM | POA: Diagnosis present

## 2024-04-27 DIAGNOSIS — Z7984 Long term (current) use of oral hypoglycemic drugs: Secondary | ICD-10-CM | POA: Diagnosis not present

## 2024-04-27 DIAGNOSIS — J45909 Unspecified asthma, uncomplicated: Secondary | ICD-10-CM | POA: Diagnosis present

## 2024-04-27 DIAGNOSIS — K852 Alcohol induced acute pancreatitis without necrosis or infection: Secondary | ICD-10-CM | POA: Diagnosis present

## 2024-04-27 DIAGNOSIS — Z8261 Family history of arthritis: Secondary | ICD-10-CM | POA: Diagnosis not present

## 2024-04-27 DIAGNOSIS — Z90710 Acquired absence of both cervix and uterus: Secondary | ICD-10-CM

## 2024-04-27 DIAGNOSIS — Z954 Presence of other heart-valve replacement: Secondary | ICD-10-CM

## 2024-04-27 DIAGNOSIS — E876 Hypokalemia: Secondary | ICD-10-CM | POA: Diagnosis present

## 2024-04-27 DIAGNOSIS — F1010 Alcohol abuse, uncomplicated: Secondary | ICD-10-CM | POA: Diagnosis present

## 2024-04-27 DIAGNOSIS — J9601 Acute respiratory failure with hypoxia: Secondary | ICD-10-CM | POA: Diagnosis present

## 2024-04-27 DIAGNOSIS — I5032 Chronic diastolic (congestive) heart failure: Secondary | ICD-10-CM | POA: Diagnosis not present

## 2024-04-27 DIAGNOSIS — T402X1A Poisoning by other opioids, accidental (unintentional), initial encounter: Secondary | ICD-10-CM | POA: Diagnosis present

## 2024-04-27 DIAGNOSIS — Z6839 Body mass index (BMI) 39.0-39.9, adult: Secondary | ICD-10-CM

## 2024-04-27 DIAGNOSIS — I10 Essential (primary) hypertension: Secondary | ICD-10-CM | POA: Diagnosis not present

## 2024-04-27 DIAGNOSIS — I110 Hypertensive heart disease with heart failure: Secondary | ICD-10-CM | POA: Diagnosis present

## 2024-04-27 DIAGNOSIS — G894 Chronic pain syndrome: Secondary | ICD-10-CM | POA: Diagnosis present

## 2024-04-27 DIAGNOSIS — Z885 Allergy status to narcotic agent status: Secondary | ICD-10-CM

## 2024-04-27 DIAGNOSIS — T50906A Underdosing of unspecified drugs, medicaments and biological substances, initial encounter: Secondary | ICD-10-CM | POA: Diagnosis present

## 2024-04-27 DIAGNOSIS — F17210 Nicotine dependence, cigarettes, uncomplicated: Secondary | ICD-10-CM | POA: Diagnosis present

## 2024-04-27 DIAGNOSIS — Z886 Allergy status to analgesic agent status: Secondary | ICD-10-CM

## 2024-04-27 LAB — CBC
HCT: 37.8 % (ref 36.0–46.0)
Hemoglobin: 13 g/dL (ref 12.0–15.0)
MCH: 31.9 pg (ref 26.0–34.0)
MCHC: 34.4 g/dL (ref 30.0–36.0)
MCV: 92.6 fL (ref 80.0–100.0)
Platelets: 215 K/uL (ref 150–400)
RBC: 4.08 MIL/uL (ref 3.87–5.11)
RDW: 14 % (ref 11.5–15.5)
WBC: 4.6 K/uL (ref 4.0–10.5)
nRBC: 0 % (ref 0.0–0.2)

## 2024-04-27 LAB — LIPASE, BLOOD: Lipase: 116 U/L — ABNORMAL HIGH (ref 11–51)

## 2024-04-27 LAB — COMPREHENSIVE METABOLIC PANEL WITH GFR
ALT: 14 U/L (ref 0–44)
AST: 30 U/L (ref 15–41)
Albumin: 3.8 g/dL (ref 3.5–5.0)
Alkaline Phosphatase: 78 U/L (ref 38–126)
Anion gap: 14 (ref 5–15)
BUN: 6 mg/dL (ref 6–20)
CO2: 27 mmol/L (ref 22–32)
Calcium: 8.1 mg/dL — ABNORMAL LOW (ref 8.9–10.3)
Chloride: 98 mmol/L (ref 98–111)
Creatinine, Ser: 0.67 mg/dL (ref 0.44–1.00)
GFR, Estimated: >60 mL/min (ref >60)
Glucose, Bld: 103 mg/dL — ABNORMAL HIGH (ref 70–99)
Potassium: 3.1 mmol/L — ABNORMAL LOW (ref 3.5–5.1)
Sodium: 139 mmol/L (ref 135–145)
Total Bilirubin: 0.5 mg/dL (ref 0.0–1.2)
Total Protein: 6.9 g/dL (ref 6.5–8.1)

## 2024-04-27 LAB — MAGNESIUM: Magnesium: 1 mg/dL — ABNORMAL LOW (ref 1.7–2.4)

## 2024-04-27 LAB — PROTIME-INR
INR: 1.6 — ABNORMAL HIGH (ref 0.8–1.2)
Prothrombin Time: 19.8 s — ABNORMAL HIGH (ref 11.4–15.2)

## 2024-04-27 LAB — CBG MONITORING, ED: Glucose-Capillary: 100 mg/dL — ABNORMAL HIGH (ref 70–99)

## 2024-04-27 MED ORDER — LORAZEPAM 2 MG/ML IJ SOLN: 1.0000 mg | INTRAMUSCULAR | Status: AC | PRN

## 2024-04-27 MED ORDER — MAGNESIUM SULFATE 2 GM/50ML IV SOLN
2.0000 g | Freq: Once | INTRAVENOUS | Status: AC
  Administered 2024-04-27: 2 g via INTRAVENOUS
  Filled 2024-04-27: qty 50

## 2024-04-27 MED ORDER — LORAZEPAM 1 MG PO TABS: 1.0000 mg | ORAL_TABLET | ORAL | Status: AC | PRN

## 2024-04-27 MED ORDER — ONDANSETRON HCL 4 MG/2ML IJ SOLN
4.0000 mg | Freq: Once | INTRAMUSCULAR | Status: AC
  Administered 2024-04-27: 4 mg via INTRAVENOUS
  Filled 2024-04-27: qty 2

## 2024-04-27 MED ORDER — INSULIN ASPART 100 UNIT/ML IJ SOLN: 0.0000 [IU] | Freq: Every day | INTRAMUSCULAR | Status: DC

## 2024-04-27 MED ORDER — THIAMINE HCL 100 MG/ML IJ SOLN: 100.0000 mg | Freq: Every day | INTRAMUSCULAR | Status: DC

## 2024-04-27 MED ORDER — HEPARIN BOLUS VIA INFUSION
4000.0000 [IU] | Freq: Once | INTRAVENOUS | Status: AC
Start: 2024-04-27 — End: 2024-04-28
  Administered 2024-04-28: 4000 [IU] via INTRAVENOUS
  Filled 2024-04-27: qty 4000

## 2024-04-27 MED ORDER — ADULT MULTIVITAMIN W/MINERALS CH
1.0000 | ORAL_TABLET | Freq: Every day | ORAL | Status: DC
  Filled 2024-04-27 (×3): qty 1

## 2024-04-27 MED ORDER — INSULIN ASPART 100 UNIT/ML IJ SOLN
0.0000 [IU] | Freq: Three times a day (TID) | INTRAMUSCULAR | Status: DC
  Administered 2024-04-29 (×2): 1 [IU] via SUBCUTANEOUS
  Filled 2024-04-27 (×2): qty 1

## 2024-04-27 MED ORDER — ONDANSETRON HCL 4 MG PO TABS
4.0000 mg | ORAL_TABLET | Freq: Four times a day (QID) | ORAL | Status: DC | PRN
  Administered 2024-04-30: 4 mg via ORAL
  Filled 2024-04-27 (×2): qty 1

## 2024-04-27 MED ORDER — HYDRALAZINE HCL 20 MG/ML IJ SOLN: 10.0000 mg | Freq: Four times a day (QID) | INTRAMUSCULAR | Status: DC | PRN

## 2024-04-27 MED ORDER — HYDROMORPHONE HCL 1 MG/ML IJ SOLN
1.0000 mg | Freq: Once | INTRAMUSCULAR | Status: AC
  Administered 2024-04-27: 1 mg via INTRAVENOUS
  Filled 2024-04-27: qty 1

## 2024-04-27 MED ORDER — MORPHINE SULFATE (PF) 4 MG/ML IV SOLN
4.0000 mg | Freq: Once | INTRAVENOUS | Status: AC
  Administered 2024-04-27: 4 mg via INTRAVENOUS
  Filled 2024-04-27: qty 1

## 2024-04-27 MED ORDER — POTASSIUM CHLORIDE 20 MEQ PO PACK
40.0000 meq | PACK | Freq: Every day | ORAL | Status: DC
Start: 2024-04-27 — End: 2024-04-29
  Administered 2024-04-27 – 2024-04-28 (×2): 40 meq via ORAL
  Filled 2024-04-27 (×2): qty 2

## 2024-04-27 MED ORDER — THIAMINE MONONITRATE 100 MG PO TABS
100.0000 mg | ORAL_TABLET | Freq: Every day | ORAL | Status: DC
  Administered 2024-04-28 – 2024-05-02 (×5): 100 mg via ORAL
  Filled 2024-04-27 (×5): qty 1

## 2024-04-27 MED ORDER — FOLIC ACID 1 MG PO TABS
1.0000 mg | ORAL_TABLET | Freq: Every day | ORAL | Status: DC
  Administered 2024-04-28 – 2024-05-02 (×5): 1 mg via ORAL
  Filled 2024-04-27 (×5): qty 1

## 2024-04-27 MED ORDER — POTASSIUM CHLORIDE CRYS ER 20 MEQ PO TBCR
40.0000 meq | EXTENDED_RELEASE_TABLET | Freq: Once | ORAL | Status: DC
  Filled 2024-04-27: qty 2

## 2024-04-27 MED ORDER — ONDANSETRON HCL 4 MG/2ML IJ SOLN
4.0000 mg | Freq: Four times a day (QID) | INTRAMUSCULAR | Status: DC | PRN
  Administered 2024-04-28 – 2024-04-30 (×7): 4 mg via INTRAVENOUS
  Filled 2024-04-27 (×7): qty 2

## 2024-04-27 MED ORDER — SODIUM CHLORIDE 0.9 % IV BOLUS
1000.0000 mL | Freq: Once | INTRAVENOUS | Status: AC
  Administered 2024-04-27: 1000 mL via INTRAVENOUS

## 2024-04-27 MED ORDER — HYDROMORPHONE HCL 1 MG/ML IJ SOLN
1.0000 mg | INTRAMUSCULAR | Status: DC | PRN
  Administered 2024-04-27 – 2024-04-30 (×26): 1 mg via INTRAVENOUS
  Filled 2024-04-27 (×26): qty 1

## 2024-04-27 MED ORDER — HEPARIN (PORCINE) 25000 UT/250ML-% IV SOLN
1350.0000 [IU]/h | INTRAVENOUS | Status: DC
  Administered 2024-04-28: 1200 [IU]/h via INTRAVENOUS
  Filled 2024-04-27 (×2): qty 250

## 2024-04-27 NOTE — Progress Notes (Signed)
 PHARMACY - ANTICOAGULATION CONSULT NOTE  Pharmacy Consult for Heparin  (briding warfarin) Indication: Mechanical Valve (subtherapeutic INR)   Allergies  Allergen Reactions   Latex Anaphylaxis, Hives and Itching    Other reaction(s): Other (See Comments) SKIN BURNING & PEELING    Lisinopril  Swelling    Other Reaction(s): Angioedema   Nsaids Other (See Comments)    GI Ulcers   Tomato Anaphylaxis, Hives and Itching   Gabapentin  Palpitations   Metformin      Other Reaction(s): Abdominal Pain  Causes pancreatitis to worsen   Semaglutide     Other Reaction(s): Other (See Comments)    Acute on chronic pancreatitis 09/2023   Duloxetine Anxiety    Makes her jittery   Tramadol Hives and Itching    Patient Measurements: Height: 5' 6 (167.6 cm) Weight: 110.7 kg (244 lb) IBW/kg (Calculated) : 59.3 HEPARIN  DW (KG): 85.1  Vital Signs: Temp: 98.8 F (37.1 C) (10/12 2123) Temp Source: Oral (10/12 2123) BP: 183/98 (10/12 2231) Pulse Rate: 83 (10/12 2231)  Labs: Recent Labs    04/27/24 1728 04/27/24 1953  HGB 13.0  --   HCT 37.8  --   PLT 215  --   LABPROT  --  19.8*  INR  --  1.6*  CREATININE 0.67  --     Estimated Creatinine Clearance: 115.5 mL/min (by C-G formula based on SCr of 0.67 mg/dL).   Medical History: Past Medical History:  Diagnosis Date   Arthritis    Asthma    Chronic pain disorder 03/19/2018   GERD (gastroesophageal reflux disease)    Headache    Heart murmur    History of trichomoniasis 03/19/2018   Hypertension    Intractable nausea and vomiting    Pancreatitis    PID (pelvic inflammatory disease) 02/14/2018   Uterine leiomyoma 03/19/2018    Medications:    Assessment: Pharmacy consulted to dose heparin  in this 43 year old female with On-X mechanical heart valve.  Pt was on warfarin 3.75 mg PO daily PTA,  last dose unknown.   Because of On-X valve , her goal INR is 1.5 - 2.0 (plus daily aspirin ).   Pt presents with pancreatitis and is  NPO so cannot take warfarin.   MD wants to anticoagulate with heparin .  10/12:  INR = 1.6 , therapeutic based on target range for On-X valve  CrCl = 115.5 ml/min   Goal of Therapy:  Heparin  level 0.3-0.7 units/ml INR :  1.5 - 2.0 - On-X type mechanical valve Monitor platelets by anticoagulation protocol: Yes   Plan:  Give 4000 units bolus x 1 Start heparin  infusion at 1200 units/hr Check anti-Xa level in 6 hours and daily while on heparin  Continue to monitor H&H and platelets  - check INR daily,  plan to resume warfarin once pt can tolerate PO  Natika Geyer D 04/27/2024,10:49 PM

## 2024-04-27 NOTE — ED Provider Notes (Signed)
 [facility] Provider Note    Event Date/Time   First MD Initiated Contact with Patient 04/27/24 1739     (approximate)   History   Abdominal Pain   HPI  Sarah Sosa is a 43 y.o. female with PMH of chronic pain disorder, pancreatitis, alcohol abuse, aortic valve replacement on warfarin, diabetes and hypertension presents for evaluation of epigastric abdominal pain that began yesterday.  Patient reports that this pain is consistent with previous episodes of her pancreatitis.  Patient states that she drank alcohol earlier this week.  She endorses nausea, vomiting and diarrhea.  Denies fevers and constipation.  No urinary symptoms.      Physical Exam   Triage Vital Signs: ED Triage Vitals  Encounter Vitals Group     BP 04/27/24 1703 (!) 196/108     Girls Systolic BP Percentile --      Girls Diastolic BP Percentile --      Boys Systolic BP Percentile --      Boys Diastolic BP Percentile --      Pulse Rate 04/27/24 1703 85     Resp --      Temp 04/27/24 1703 98.4 F (36.9 C)     Temp Source 04/27/24 1703 Oral     SpO2 04/27/24 1703 97 %     Weight 04/27/24 1704 244 lb (110.7 kg)     Height 04/27/24 1704 5' 6 (1.676 m)     Head Circumference --      Peak Flow --      Pain Score 04/27/24 1703 10     Pain Loc --      Pain Education --      Exclude from Growth Chart --     Most recent vital signs: Vitals:   04/27/24 1703 04/27/24 2123  BP: (!) 196/108   Pulse: 85   Temp: 98.4 F (36.9 C) 98.8 F (37.1 C)  SpO2: 97%    General: Awake, mild discomfort. CV:  Good peripheral perfusion.  RRR. Resp:  Normal effort.  CTAB. Abd:  No distention.  Soft, very tender to light palpation across the upper abdomen Other:     ED Results / Procedures / Treatments   Labs (all labs ordered are listed, but only abnormal results are displayed) Labs Reviewed  LIPASE, BLOOD - Abnormal; Notable for the following components:      Result Value   Lipase  116 (*)    All other components within normal limits  COMPREHENSIVE METABOLIC PANEL WITH GFR - Abnormal; Notable for the following components:   Potassium 3.1 (*)    Glucose, Bld 103 (*)    Calcium  8.1 (*)    All other components within normal limits  PROTIME-INR - Abnormal; Notable for the following components:   Prothrombin Time 19.8 (*)    INR 1.6 (*)    All other components within normal limits  MAGNESIUM  - Abnormal; Notable for the following components:   Magnesium  1.0 (*)    All other components within normal limits  CBC  URINALYSIS, ROUTINE W REFLEX MICROSCOPIC    PROCEDURES:  Critical Care performed: No  Procedures   MEDICATIONS ORDERED IN ED: Medications  potassium chloride  (KLOR-CON ) packet 40 mEq (40 mEq Oral Given 04/27/24 2003)  sodium chloride  0.9 % bolus 1,000 mL (0 mLs Intravenous Stopped 04/27/24 2112)  ondansetron  (ZOFRAN ) injection 4 mg (4 mg Intravenous Given 04/27/24 1954)  morphine  (PF) 4 MG/ML injection 4 mg (4 mg Intravenous Given 04/27/24 1954)  magnesium  sulfate IVPB 2 g 50 mL (0 g Intravenous Stopped 04/27/24 2112)  HYDROmorphone  (DILAUDID ) injection 1 mg (1 mg Intravenous Given 04/27/24 2118)     IMPRESSION / MDM / ASSESSMENT AND PLAN / ED COURSE  I reviewed the triage vital signs and the nursing notes.                             43 year old female presents for evaluation of abdominal pain.  Patient is hypertensive, vital signs stable otherwise.  Patient does appear uncomfortable on exam.  Differential diagnosis includes, but is not limited to, acute on chronic pancreatitis, biliary disease, electrolyte abnormality, AKI, gastroenteritis.  Patient's presentation is most consistent with exacerbation of chronic illness.  Patient reports that this pain is consistent with previous episodes of pancreatitis.  Review of patient's chart shows several admissions to the hospital for pancreatitis.  Patient is tender to palpation across the upper abdomen.   Will plan to get labs, if reassuring will hold off on imaging as patient has had 6 CT scans this year, the last 1 in July.  Will give patient IV fluids, nausea medication and pain medication and reassess.  Patient has been admitted to the hospital several times this year for pain management with the pancreatitis.  Will attempt to get her pain under control in the ED but if unable we will plan to admit for pain control.   Clinical Course as of 04/27/24 2221  Sun Apr 27, 2024  1915 CBC Unremarkable, no leukocytosis. [LD]  1915 Comprehensive metabolic panel(!) Hypokalemia, otherwise unremarkable.  Will check magnesium . [LD]  1915 Magnesium (!) Hypomagnesemia, will plan to replete with IV magnesium  and oral potassium.  If patient unable to tolerate p.o. will give IV potassium. [LD]  1916 Lipase, blood(!) Elevated, consistent with pancreatitis. [LD]  8042 Patient requesting the powdered drinkable potassium rather than the pill, will change the order. [LD]  2043 Patient reports minimal improvement in her pain with the morphine  and feels it has wore off, asking for more pain medication, will treat with dialudid which patient has been given on previous visits. [LD]  2132 Protime-INR(!) INR subtherapeutic [LD]  2200 Patient has not had much improvement in her pain.  Will plan to admit for pain control. [LD]  2218 Hospitalist agreeable to admit.  Patient reassessed and reports her pain is an 8 out of 10.  She is requesting further pain medication.  Hospitalist made aware. [LD]    Clinical Course User Index [LD] Cleaster Tinnie LABOR, PA-C     FINAL CLINICAL IMPRESSION(S) / ED DIAGNOSES   Final diagnoses:  Alcohol-induced acute pancreatitis, unspecified complication status     Rx / DC Orders   ED Discharge Orders     None        Note:  This document was prepared using Dragon voice recognition software and may include unintentional dictation errors.   Cleaster Tinnie LABOR, PA-C 04/27/24  2222    Floy Roberts, MD 04/29/24 1101

## 2024-04-27 NOTE — H&P (Signed)
 History and Physical    Patient: Sarah Sosa FMW:[id number] DOB: 02/18/1981 DOA: 04/27/2024 DOS: the patient was seen and examined on 04/27/2024 PCP: Clinic, [facility]  Patient coming from: Home  Chief Complaint:  Chief Complaint  Patient presents with   Abdominal Pain   HPI: Kerston Landeck is a 43 y.o. female with medical history significant of alcohol abuse, chronic pancreatitis, GERD, diet-controlled diabetes, history of aortic valve replacement, history of mechanical valve replacement on warfarin, asthma, medication noncompliance, who presented to the ER with intractable nausea vomiting and abdominal pain.  The pain is in the epigastric region and is persistent.  Rated as 10 out of 10.  Consistent with her previous pancreatitis.  Patient continues to drink alcohol despite having the pancreatitis.  Her last drink was earlier this week she said.  Patient has significant hypertension.  Also came in with hyperkalemia and subtherapeutic INR.  She has been admitted with acute on chronic pancreatitis.  Her lipase is elevated.  Review of Systems: As mentioned in the history of present illness. All other systems reviewed and are negative. Past Medical History:  Diagnosis Date   Arthritis    Asthma    Chronic pain disorder 03/19/2018   GERD (gastroesophageal reflux disease)    Headache    Heart murmur    History of trichomoniasis 03/19/2018   Hypertension    Intractable nausea and vomiting    Pancreatitis    PID (pelvic inflammatory disease) 02/14/2018   Uterine leiomyoma 03/19/2018   Past Surgical History:  Procedure Laterality Date   ABDOMINAL HYSTERECTOMY     AORTIC VALVE REPLACEMENT  03/2022   CHOLECYSTECTOMY     DENTAL SURGERY     HYSTERECTOMY ABDOMINAL WITH SALPINGECTOMY Bilateral 04/15/2018   Procedure: HYSTERECTOMY ABDOMINAL WITH BILATERAL SALPINGECTOMY;  Surgeon: Connell Davies, MD;  Location: [facility] ORS;  Service: Gynecology;  Laterality: Bilateral;   kenn surgery  Bilateral    KNEE ARTHROSCOPY Left 2012, 2013   Social History:  reports that she has been smoking cigarettes. She has a 11 pack-year smoking history. She has never used smokeless tobacco. She reports that she does not currently use alcohol. She reports current drug use. Drug: Marijuana.  Allergies  Allergen Reactions   Latex Anaphylaxis, Hives and Itching    Other reaction(s): Other (See Comments) SKIN BURNING & PEELING    Lisinopril  Swelling    Other Reaction(s): Angioedema   Nsaids Other (See Comments)    GI Ulcers   Tomato Anaphylaxis, Hives and Itching   Gabapentin  Palpitations   Metformin      Other Reaction(s): Abdominal Pain  Causes pancreatitis to worsen   Semaglutide     Other Reaction(s): Other (See Comments)    Acute on chronic pancreatitis 09/2023   Duloxetine Anxiety    Makes her jittery   Tramadol Hives and Itching    Family History  Problem Relation Age of Onset   Fibroids Mother    Hypertension Mother    Arthritis Mother     Prior to Admission medications   Medication Sig Start Date End Date Taking? Authorizing Provider  albuterol  (VENTOLIN  HFA) 108 (90 Base) MCG/ACT inhaler Inhale 1-2 puffs into the lungs every 6 (six) hours as needed. 07/26/23   [provider]  amitriptyline  (ELAVIL ) 10 MG tablet Take 10 mg by mouth 3 (three) times daily as needed for sleep (or pain). 11/02/22   [provider]  amLODipine  (NORVASC ) 10 MG tablet Take 1 tablet (10 mg total) by mouth daily. 07/19/23  Darci Pore, MD  atorvastatin  (LIPITOR) 40 MG tablet Take 40 mg by mouth daily. 10/17/22   [provider]  buprenorphine  (SUBUTEX ) 8 MG SUBL SL tablet Place 8 mg under the tongue. Place 1 tablet (8 mg total) under the tongue 3 (three) times daily 01/11/24   [provider]  furosemide  (LASIX ) 40 MG tablet Take 0.5 tablets (20 mg total) by mouth daily as needed. Home med. Patient taking differently: Take 40 mg by mouth daily as needed for  edema. Home med. 10/26/23   Awanda City, MD  lipase/protease/amylase (CREON ) 36000 UNITS CPEP capsule Take 1 capsule (36,000 Units total) by mouth 3 (three) times daily with meals. 08/13/23   Jhonny Calvin NOVAK, MD  losartan  (COZAAR ) 50 MG tablet Take 1 tablet (50 mg total) by mouth daily. 07/19/23   Darci Pore, MD  magnesium  chloride (SLOW-MAG) 64 MG TBEC SR tablet Take 1 tablet (64 mg total) by mouth daily. Patient not taking: Reported on 01/25/2024 12/14/23   Alexander, Natalie, DO  metFORMIN  (GLUCOPHAGE ) 500 MG tablet Take 500 mg by mouth every morning. 10/17/22   [provider]  naloxone  (NARCAN ) nasal spray 4 mg/0.1 mL Place 1 spray into the nose once as needed (to reverse overdose). 02/24/23   [provider]  omeprazole (PRILOSEC) 40 MG capsule Take 40 mg by mouth daily. 02/23/23   [provider]  ondansetron  (ZOFRAN ) 4 MG tablet Take 1 tablet (4 mg total) by mouth daily as needed for nausea or vomiting. 08/13/23 08/12/24  Jhonny Calvin NOVAK, MD  ondansetron  (ZOFRAN -ODT) 4 MG disintegrating tablet Take 4 mg by mouth every 8 (eight) hours as needed for nausea. 02/08/24   [provider]  oxyCODONE  (OXY IR/ROXICODONE ) 5 MG immediate release tablet Take 1 tablet (5 mg total) by mouth every 6 (six) hours as needed for moderate pain (pain score 4-6) or severe pain (pain score 7-10). 04/01/24   Awanda City, MD  potassium chloride  20 MEQ/15ML (10%) SOLN Take 20 mEq by mouth 2 (two) times daily. 03/14/24 06/17/24  [provider]  spironolactone  (ALDACTONE ) 25 MG tablet Take 25 mg by mouth daily. 02/03/22   [provider]  traZODone  (DESYREL ) 50 MG tablet Take 100 mg by mouth at bedtime. 09/07/21   [provider]  warfarin (COUMADIN ) 7.5 MG tablet Take 0.5 tablets (3.75 mg total) by mouth daily. 12/14/23   Marsa Edelman, DO    Physical Exam: Vitals:   04/27/24 1703 04/27/24 1704 04/27/24 2123  BP: (!) 196/108    Pulse: 85    Temp:  98.4 F (36.9 C)  98.8 F (37.1 C)  TempSrc: Oral  Oral  SpO2: 97%    Weight:  110.7 kg   Height:  5' 6 (1.676 m)    Constitutional: Acutely ill looking no distress  Eyes: PERRL, lids and conjunctivae normal ENMT: Mucous membranes are dry. Posterior pharynx clear of any exudate or lesions.Normal dentition.  Neck: normal, supple, no masses, no thyromegaly Respiratory: clear to auscultation bilaterally, no wheezing, no crackles. Normal respiratory effort. No accessory muscle use.  Cardiovascular: Regular rate and rhythm, no murmurs / rubs / gallops. No extremity edema. 2+ pedal pulses. No carotid bruits.  Abdomen: Diffusely tender no rebound no masses palpated. No hepatosplenomegaly. Bowel sounds positive.  Musculoskeletal: Good range of motion, no joint swelling or tenderness, Skin: no rashes, lesions, ulcers. No induration Neurologic: CN 2-12 grossly intact. Sensation intact, DTR normal. Strength 5/5 in all 4.  Psychiatric: Normal judgment and insight. Alert  and oriented x 3. Normal mood  Data Reviewed:  Temperature98.8, blood pressure 196/108, pulse 85, potassium 3.1 calcium  8.1 INR 1.6 glucose 103.  Lipase is 116, magnesium  1.0  Assessment and Plan:  #1 acute on chronic pancreatitis: Patient has alcoholic pancreatitis.  This symptoms could may have also been triggered by drinking.  Patient has continued to drink.  Will admit the patient.  Complete bowel rest.  IV pain control and IV fluids.  Nausea vomiting controlled.  #2 hypokalemia: Replete potassium.  #3 hypomagnesemia: Replete magnesium .  #4 alcohol abuse: Patient will be placed on the CIWA protocol.  Counseling provided  #5 uncontrolled hypertension: Will initiate hydralazine  and continue to monitor.  #6 GERD: Continue with PPIs  #7 heart failure with preserved ejection fraction: Continue close monitoring.  Appears to be compensated.  #8 status post aortic valve replacement: On warfarin.  Patient will be n.p.o. so we  will initiate IV heparin  drip.  Resume warfarin when able to eat and drink  #9 diet-controlled diabetes: Last A1c was 5.6.  Will have glucose surveillance.  Continue to monitor   Advance Care Planning:   Code Status: Prior full code  Consults: None  Family Communication: No family at bedside  Severity of Illness: The appropriate patient status for this patient is INPATIENT. Inpatient status is judged to be reasonable and necessary in order to provide the required intensity of service to ensure the patient's safety. The patient's presenting symptoms, physical exam findings, and initial radiographic and laboratory data in the context of their chronic comorbidities is felt to place them at high risk for further clinical deterioration. Furthermore, it is not anticipated that the patient will be medically stable for discharge from the hospital within 2 midnights of admission.   * I certify that at the point of admission it is my clinical judgment that the patient will require inpatient hospital care spanning beyond 2 midnights from the point of admission due to high intensity of service, high risk for further deterioration and high frequency of surveillance required.*  AuthorBETHA SIM KNOLL, MD 04/27/2024 10:22 PM  For on call review [email or web address].

## 2024-04-27 NOTE — ED Triage Notes (Signed)
 Pt in via POV, reports LUQ abdominal pain, progressing over the last day.  Patient states, I have Chronic Pancreatitis.  Denies alcohol use, reports N/V/D.  Ambulatory to triage, NAD noted at this time.

## 2024-04-28 DIAGNOSIS — E785 Hyperlipidemia, unspecified: Secondary | ICD-10-CM

## 2024-04-28 DIAGNOSIS — F1010 Alcohol abuse, uncomplicated: Secondary | ICD-10-CM | POA: Diagnosis not present

## 2024-04-28 DIAGNOSIS — Z952 Presence of prosthetic heart valve: Secondary | ICD-10-CM

## 2024-04-28 DIAGNOSIS — E119 Type 2 diabetes mellitus without complications: Secondary | ICD-10-CM

## 2024-04-28 DIAGNOSIS — R112 Nausea with vomiting, unspecified: Secondary | ICD-10-CM

## 2024-04-28 DIAGNOSIS — K861 Other chronic pancreatitis: Secondary | ICD-10-CM

## 2024-04-28 DIAGNOSIS — I5032 Chronic diastolic (congestive) heart failure: Secondary | ICD-10-CM

## 2024-04-28 DIAGNOSIS — E876 Hypokalemia: Secondary | ICD-10-CM

## 2024-04-28 DIAGNOSIS — K859 Acute pancreatitis without necrosis or infection, unspecified: Secondary | ICD-10-CM | POA: Diagnosis not present

## 2024-04-28 DIAGNOSIS — F17210 Nicotine dependence, cigarettes, uncomplicated: Secondary | ICD-10-CM

## 2024-04-28 DIAGNOSIS — K219 Gastro-esophageal reflux disease without esophagitis: Secondary | ICD-10-CM

## 2024-04-28 DIAGNOSIS — I10 Essential (primary) hypertension: Secondary | ICD-10-CM

## 2024-04-28 LAB — COMPREHENSIVE METABOLIC PANEL WITH GFR
ALT: 13 U/L (ref 0–44)
AST: 27 U/L (ref 15–41)
Albumin: 3.7 g/dL (ref 3.5–5.0)
Alkaline Phosphatase: 74 U/L (ref 38–126)
Anion gap: 15 (ref 5–15)
BUN: <5 mg/dL — ABNORMAL LOW (ref 6–20)
CO2: 25 mmol/L (ref 22–32)
Calcium: 8.3 mg/dL — ABNORMAL LOW (ref 8.9–10.3)
Chloride: 96 mmol/L — ABNORMAL LOW (ref 98–111)
Creatinine, Ser: 0.66 mg/dL (ref 0.44–1.00)
GFR, Estimated: >60 mL/min (ref >60)
Glucose, Bld: 119 mg/dL — ABNORMAL HIGH (ref 70–99)
Potassium: 3.4 mmol/L — ABNORMAL LOW (ref 3.5–5.1)
Sodium: 136 mmol/L (ref 135–145)
Total Bilirubin: 1 mg/dL (ref 0.0–1.2)
Total Protein: 7.1 g/dL (ref 6.5–8.1)

## 2024-04-28 LAB — CBC
HCT: 39.1 % (ref 36.0–46.0)
Hemoglobin: 12.9 g/dL (ref 12.0–15.0)
MCH: 31.1 pg (ref 26.0–34.0)
MCHC: 33 g/dL (ref 30.0–36.0)
MCV: 94.2 fL (ref 80.0–100.0)
Platelets: 207 K/uL (ref 150–400)
RBC: 4.15 MIL/uL (ref 3.87–5.11)
RDW: 14 % (ref 11.5–15.5)
WBC: 6.1 K/uL (ref 4.0–10.5)
nRBC: 0 % (ref 0.0–0.2)

## 2024-04-28 LAB — GLUCOSE, CAPILLARY
Glucose-Capillary: 114 mg/dL — ABNORMAL HIGH (ref 70–99)
Glucose-Capillary: 115 mg/dL — ABNORMAL HIGH (ref 70–99)
Glucose-Capillary: 111 mg/dL — ABNORMAL HIGH (ref 70–99)
Glucose-Capillary: 111 mg/dL — ABNORMAL HIGH (ref 70–99)

## 2024-04-28 LAB — PROTIME-INR
INR: 1.7 — ABNORMAL HIGH (ref 0.8–1.2)
Prothrombin Time: 21.1 s — ABNORMAL HIGH (ref 11.4–15.2)

## 2024-04-28 LAB — HEPARIN LEVEL (UNFRACTIONATED): Heparin Unfractionated: 0.29 [IU]/mL — ABNORMAL LOW (ref 0.30–0.70)

## 2024-04-28 LAB — MAGNESIUM: Magnesium: 1.4 mg/dL — ABNORMAL LOW (ref 1.7–2.4)

## 2024-04-28 MED ORDER — WARFARIN - PHARMACIST DOSING INPATIENT: Freq: Every day | Status: DC

## 2024-04-28 MED ORDER — MAGNESIUM SULFATE 4 GM/100ML IV SOLN
4.0000 g | Freq: Once | INTRAVENOUS | Status: AC
  Administered 2024-04-28: 4 g via INTRAVENOUS
  Filled 2024-04-28: qty 100

## 2024-04-28 MED ORDER — LOSARTAN POTASSIUM 50 MG PO TABS
50.0000 mg | ORAL_TABLET | Freq: Every day | ORAL | Status: DC
  Administered 2024-04-28 – 2024-05-02 (×5): 50 mg via ORAL
  Filled 2024-04-28 (×5): qty 1

## 2024-04-28 MED ORDER — TRAZODONE HCL 100 MG PO TABS
100.0000 mg | ORAL_TABLET | Freq: Every day | ORAL | Status: DC
  Administered 2024-04-28 – 2024-05-01 (×4): 100 mg via ORAL
  Filled 2024-04-28 (×4): qty 1

## 2024-04-28 MED ORDER — NICOTINE 14 MG/24HR TD PT24
14.0000 mg | MEDICATED_PATCH | Freq: Every day | TRANSDERMAL | Status: DC
  Filled 2024-04-28 (×3): qty 1

## 2024-04-28 MED ORDER — BUPRENORPHINE HCL 2 MG SL SUBL: 8.0000 mg | SUBLINGUAL_TABLET | Freq: Every day | SUBLINGUAL | Status: DC

## 2024-04-28 MED ORDER — PANTOPRAZOLE SODIUM 40 MG PO TBEC
40.0000 mg | DELAYED_RELEASE_TABLET | Freq: Every day | ORAL | Status: DC
  Administered 2024-04-28 – 2024-05-02 (×5): 40 mg via ORAL
  Filled 2024-04-28 (×6): qty 1

## 2024-04-28 MED ORDER — WARFARIN SODIUM 2.5 MG PO TABS
3.7500 mg | ORAL_TABLET | Freq: Once | ORAL | Status: AC
  Administered 2024-04-28: 3.75 mg via ORAL
  Filled 2024-04-28: qty 1

## 2024-04-28 MED ORDER — AMLODIPINE BESYLATE 10 MG PO TABS
10.0000 mg | ORAL_TABLET | Freq: Every day | ORAL | Status: AC
Start: 2024-04-28 — End: ?
  Administered 2024-04-28 – 2024-05-02 (×5): 10 mg via ORAL
  Filled 2024-04-28 (×5): qty 1

## 2024-04-28 MED ORDER — BUPRENORPHINE HCL 2 MG SL SUBL
8.0000 mg | SUBLINGUAL_TABLET | Freq: Three times a day (TID) | SUBLINGUAL | Status: AC
Start: 2024-04-28 — End: ?
  Administered 2024-04-30 – 2024-05-02 (×7): 8 mg via SUBLINGUAL
  Filled 2024-04-28 (×8): qty 4

## 2024-04-28 MED ORDER — HEPARIN BOLUS VIA INFUSION
1250.0000 [IU] | Freq: Once | INTRAVENOUS | Status: AC
  Administered 2024-04-28: 1250 [IU] via INTRAVENOUS
  Filled 2024-04-28: qty 1250

## 2024-04-28 NOTE — Assessment & Plan Note (Signed)
 Improving with potassium of 3.4 now. - Replace potassium and monitor

## 2024-04-28 NOTE — Progress Notes (Signed)
 Heparin  gtt verified running 66ml/hr.

## 2024-04-28 NOTE — Assessment & Plan Note (Signed)
 Secondary to acute on chronic pancreatitis. Seems improving -Continue supportive care

## 2024-04-28 NOTE — Progress Notes (Signed)
 Progress Note   Patient: Sarah Sosa FMW:[id number] DOB: 1981/04/11 DOA: 04/27/2024     1 DOS: the patient was seen and examined on 04/28/2024   Brief hospital course: Partly taken from H&P.  Sarah Sosa is a 43 y.o. female with medical history significant of alcohol abuse, chronic pancreatitis, GERD, diet-controlled diabetes, history of aortic valve replacement, history of mechanical valve replacement on warfarin, asthma, medication noncompliance, who presented to the ER with intractable nausea vomiting and abdominal pain.  Symptoms consistent with her prior episodes of pancreatitis.  Patient continued to drink alcohol.  Last drink was earlier this week.  On presentation vital stable, labs with hypokalemia, hypomagnesemia and subtherapeutic INR.  Lipase elevated at 116.  Patient was admitted for acute on chronic alcoholic pancreatitis.  10/13: Vital stable, CIWA score of 1, potassium improved to 3.4, magnesium  at 1.4 Vomiting improved so starting on clear liquid diet.  Assessment and Plan: * Acute on chronic pancreatitis (HCC) History of alcoholic pancreatitis and patient continued to drink.  Continued to have significant pain, nausea and vomiting improving. - Starting on clear liquid diet -Continue with supportive care  Intractable nausea and vomiting Secondary to acute on chronic pancreatitis. Seems improving -Continue supportive care  Hypomagnesemia Magnesium  of 1.4, improved from 1 -Replace magnesium  and monitor  Alcohol abuse Patient was counseled again. -Continue with CIWA protocol  Hypokalemia Improving with potassium of 3.4 now. - Replace potassium and monitor  Essential hypertension Blood pressure mildly elevated -Restarting home amlodipine  and losartan   (HFpEF) heart failure with preserved ejection fraction (HCC) Appears well compensated. - Continue to monitor -Holding spironolactone   GERD (gastroesophageal reflux disease) - Continue PPI  S/p aortic  valve replacement with 'On-X valve (INR target of 1.5 to 2) and ascending aortic graft ([facility], 03/2022) Patient was on Coumadin  with subtherapeutic INR on admission.  Per patient she was compliant with medications. - She was currently on heparin  infusion while she was n.p.o. -Switching heparin  with Coumadin  as tolerating clear liquid liquid diet now -Coumadin  per pharmacy  Dyslipidemia - Currently holding Crestor as she was still little nauseated  DM2 (diabetes mellitus, type 2) (HCC) Seems well-controlled, patient has diet controlled diabetes with A1c of 5.6. - Continue to monitor  Continuous dependence on cigarette smoking Counseling was provided. - Nicotine  patch as needed   Subjective: Patient was seen and examined today.  Still having significant upper abdominal pain, mild nausea but no more vomiting.  Patient still drinks significant amount of alcohol despite having multiple episodes of pancreatitis.  Physical Exam: Vitals:   04/27/24 2231 04/28/24 0127 04/28/24 0136 04/28/24 0633  BP: (!) 183/98 (!) 160/86 (!) 160/86 134/77  Pulse: 83 70 70 73  Resp: 18 20  19   Temp:  98.6 F (37 C)  98.8 F (37.1 C)  TempSrc:  Oral  Oral  SpO2: 97% 100%  92%  Weight:      Height:       General.  Morbidly obese lady, in no acute distress. Pulmonary.  Lungs clear bilaterally, normal respiratory effort. CV.  Regular rate and rhythm, no JVD, rub or murmur. Abdomen.  Soft, mild epigastric tenderness, nondistended, BS positive. CNS.  Alert and oriented .  No focal neurologic deficit. Extremities.  No edema, no cyanosis, pulses intact and symmetrical. Psychiatry.  Judgment and insight appears normal.   Data Reviewed: Prior data reviewed  Family Communication: Discussed with patient  Disposition: Status is: Inpatient Remains inpatient appropriate because: Severity of illness  Planned Discharge Destination: Home  DVT  prophylaxis.  Coumadin  Time spent: 50 minutes  This record has  been created using Conservation officer, historic buildings. Errors have been sought and corrected,but may not always be located. Such creation errors do not reflect on the standard of care.   Author: Amaryllis Dare, MD 04/28/2024 2:25 PM  For on call review [email or web address].

## 2024-04-28 NOTE — Assessment & Plan Note (Signed)
 Continue PPI.

## 2024-04-28 NOTE — Assessment & Plan Note (Signed)
 History of alcoholic pancreatitis and patient continued to drink.  Continued to have significant pain, nausea and vomiting improving. - Starting on clear liquid diet -Continue with supportive care

## 2024-04-28 NOTE — Assessment & Plan Note (Signed)
 Seems well-controlled, patient has diet controlled diabetes with A1c of 5.6. - Continue to monitor

## 2024-04-28 NOTE — Assessment & Plan Note (Signed)
 Patient was on Coumadin  with subtherapeutic INR on admission.  Per patient she was compliant with medications. - She was currently on heparin  infusion while she was n.p.o. -Switching heparin  with Coumadin  as tolerating clear liquid liquid diet now -Coumadin  per pharmacy

## 2024-04-28 NOTE — Assessment & Plan Note (Signed)
-   Currently holding Crestor as she was still little nauseated

## 2024-04-28 NOTE — Assessment & Plan Note (Signed)
Counseling was provided. -Nicotine patch as needed 

## 2024-04-28 NOTE — Progress Notes (Addendum)
 PHARMACY - ANTICOAGULATION CONSULT NOTE  Pharmacy Consult for Heparin  (briding warfarin) Indication: Mechanical Valve (subtherapeutic INR)   Allergies  Allergen Reactions   Latex Anaphylaxis, Hives and Itching    Other reaction(s): Other (See Comments) SKIN BURNING & PEELING    Lisinopril  Swelling    Other Reaction(s): Angioedema   Nsaids Other (See Comments)    GI Ulcers   Tomato Anaphylaxis, Hives and Itching   Gabapentin  Palpitations   Metformin      Other Reaction(s): Abdominal Pain  Causes pancreatitis to worsen   Semaglutide     Other Reaction(s): Other (See Comments)    Acute on chronic pancreatitis 09/2023   Duloxetine Anxiety    Makes her jittery   Tramadol Hives and Itching    Patient Measurements: Height: 5' 6 (167.6 cm) Weight: 110.7 kg (244 lb) IBW/kg (Calculated) : 59.3 HEPARIN  DW (KG): 85.1  Vital Signs: Temp: 98.8 F (37.1 C) (10/13 0633) Temp Source: Oral (10/13 0633) BP: 134/77 (10/13 0633) Pulse Rate: 73 (10/13 0633)  Labs: Recent Labs    04/27/24 1728 04/27/24 1953 04/28/24 0556 04/28/24 0740  HGB 13.0  --  12.9  --   HCT 37.8  --  39.1  --   PLT 215  --  207  --   LABPROT  --  19.8*  --  21.1*  INR  --  1.6*  --  1.7*  HEPARINUNFRC  --   --   --  0.29*  CREATININE 0.67  --  0.66  --     Estimated Creatinine Clearance: 115.5 mL/min (by C-G formula based on SCr of 0.66 mg/dL).   Medical History: Past Medical History:  Diagnosis Date   Arthritis    Asthma    Chronic pain disorder 03/19/2018   GERD (gastroesophageal reflux disease)    Headache    Heart murmur    History of trichomoniasis 03/19/2018   Hypertension    Intractable nausea and vomiting    Pancreatitis    PID (pelvic inflammatory disease) 02/14/2018   Uterine leiomyoma 03/19/2018    Medications:  Warfarin 3.75mg  PO daily (has 7.5mg  tablets) Last dose 10/12  ASSESSMENT: Pharmacy consulted to dose heparin  in this 43 year old female with On-X mechanical  heart valve.  Pt was on warfarin 3.75 mg PO daily PTA,  last dose unknown.   Because of On-X valve , her goal INR is 1.5 - 2.0 (plus daily aspirin ).   Pt presents with pancreatitis and is NPO so cannot take warfarin.   MD wants to anticoagulate with heparin   CrCl = 115.5 ml/min  Goal of Therapy: Heparin  level 0.3-0.7 units/ml INR :  1.5 - 2.0 - On-X type mechanical valve Monitor platelets by anticoagulation protocol: Yes   Warfarin Monitoring: 10/12: INR = 1.6 , therapeutic based on target range for On-X valve 10/13: INR = 1.7, therapeutic  Heparin  Monitoring: 10/13 @ 0740: HL 0.29, SUBtherapeutic   PLANS: -- Warfarin: Holding warfarin while patient NPO Continue checking INR daily Look to resume warfarin once patient can tolerate oral intake   -- Heparin : 10/13 @ 0740: HL 0.29, SUBtherapeutic Give 1250 units bolus x 1 Increase heparin  infusion to 1350 units/hr Check anti-Xa level in 6 hours and daily while on heparin  Continue to monitor H&H and platelets  Will M. Lenon, PharmD, BCPS Clinical Pharmacist 04/28/2024 9:09 AM

## 2024-04-28 NOTE — Assessment & Plan Note (Signed)
 Magnesium  of 1.4, improved from 1 -Replace magnesium  and monitor

## 2024-04-28 NOTE — Assessment & Plan Note (Signed)
 Appears well compensated. - Continue to monitor -Holding spironolactone 

## 2024-04-28 NOTE — Hospital Course (Addendum)
 Partly taken from H&P.  Sarah Sosa is a 43 y.o. female with medical history significant of alcohol abuse, chronic pancreatitis, GERD, diet-controlled diabetes, history of aortic valve replacement, history of mechanical valve replacement on warfarin, asthma, medication noncompliance, who presented to the ER with intractable nausea vomiting and abdominal pain.  Symptoms consistent with her prior episodes of pancreatitis.  Patient continued to drink alcohol.  Last drink was earlier this week.  On presentation vital stable, labs with hypokalemia, hypomagnesemia and subtherapeutic INR.  Lipase elevated at 116.  Patient was admitted for acute on chronic alcoholic pancreatitis.  10/13: Vital stable, CIWA score of 1, potassium improved to 3.4, magnesium  at 1.4 Vomiting improved so starting on clear liquid diet.  10/14: Vital stable, mild hypophosphatemia with phosphorus at 2.3.  Still significant pain and nausea, unable to tolerate any advancement of diet, only drinking ginger ale.

## 2024-04-28 NOTE — Assessment & Plan Note (Signed)
 Patient was counseled again. -Continue with CIWA protocol

## 2024-04-28 NOTE — Assessment & Plan Note (Signed)
 Blood pressure mildly elevated -Restarting home amlodipine  and losartan 

## 2024-04-29 DIAGNOSIS — E876 Hypokalemia: Secondary | ICD-10-CM | POA: Diagnosis not present

## 2024-04-29 DIAGNOSIS — K859 Acute pancreatitis without necrosis or infection, unspecified: Secondary | ICD-10-CM | POA: Diagnosis not present

## 2024-04-29 DIAGNOSIS — R112 Nausea with vomiting, unspecified: Secondary | ICD-10-CM | POA: Diagnosis not present

## 2024-04-29 DIAGNOSIS — F1010 Alcohol abuse, uncomplicated: Secondary | ICD-10-CM | POA: Diagnosis not present

## 2024-04-29 LAB — RENAL FUNCTION PANEL
Albumin: 4 g/dL (ref 3.5–5.0)
Anion gap: 14 (ref 5–15)
BUN: <5 mg/dL — ABNORMAL LOW (ref 6–20)
CO2: 24 mmol/L (ref 22–32)
Calcium: 8.9 mg/dL (ref 8.9–10.3)
Chloride: 99 mmol/L (ref 98–111)
Creatinine, Ser: 0.79 mg/dL (ref 0.44–1.00)
GFR, Estimated: >60 mL/min (ref >60)
Glucose, Bld: 104 mg/dL — ABNORMAL HIGH (ref 70–99)
Phosphorus: 2.3 mg/dL — ABNORMAL LOW (ref 2.5–4.6)
Potassium: 3.5 mmol/L (ref 3.5–5.1)
Sodium: 137 mmol/L (ref 135–145)

## 2024-04-29 LAB — URINALYSIS, ROUTINE W REFLEX MICROSCOPIC
Bilirubin Urine: NEGATIVE
Glucose, UA: NEGATIVE mg/dL
Ketones, ur: 5 mg/dL — AB
Leukocytes,Ua: NEGATIVE
Nitrite: NEGATIVE
Protein, ur: NEGATIVE mg/dL
Specific Gravity, Urine: 1.003 — ABNORMAL LOW (ref 1.005–1.030)
pH: 7 (ref 5.0–8.0)

## 2024-04-29 LAB — MAGNESIUM: Magnesium: 2.1 mg/dL (ref 1.7–2.4)

## 2024-04-29 LAB — GLUCOSE, CAPILLARY
Glucose-Capillary: 114 mg/dL — ABNORMAL HIGH (ref 70–99)
Glucose-Capillary: 132 mg/dL — ABNORMAL HIGH (ref 70–99)
Glucose-Capillary: 125 mg/dL — ABNORMAL HIGH (ref 70–99)
Glucose-Capillary: 93 mg/dL (ref 70–99)

## 2024-04-29 LAB — CBC
HCT: 40.5 % (ref 36.0–46.0)
Hemoglobin: 13.5 g/dL (ref 12.0–15.0)
MCH: 31.4 pg (ref 26.0–34.0)
MCHC: 33.3 g/dL (ref 30.0–36.0)
MCV: 94.2 fL (ref 80.0–100.0)
Platelets: 181 K/uL (ref 150–400)
RBC: 4.3 MIL/uL (ref 3.87–5.11)
RDW: 14 % (ref 11.5–15.5)
WBC: 4 K/uL (ref 4.0–10.5)
nRBC: 0 % (ref 0.0–0.2)

## 2024-04-29 LAB — PROTIME-INR
INR: 1.4 — ABNORMAL HIGH (ref 0.8–1.2)
Prothrombin Time: 18.1 s — ABNORMAL HIGH (ref 11.4–15.2)

## 2024-04-29 MED ORDER — WARFARIN SODIUM 2.5 MG PO TABS
3.7500 mg | ORAL_TABLET | Freq: Once | ORAL | Status: AC
  Administered 2024-04-29: 3.75 mg via ORAL
  Filled 2024-04-29: qty 1

## 2024-04-29 MED ORDER — K PHOS MONO-SOD PHOS DI & MONO 155-852-130 MG PO TABS
500.0000 mg | ORAL_TABLET | Freq: Two times a day (BID) | ORAL | Status: AC
  Administered 2024-04-29 (×2): 500 mg via ORAL
  Filled 2024-04-29 (×2): qty 2

## 2024-04-29 NOTE — Assessment & Plan Note (Addendum)
 Blood pressure mildly elevated - Continue home amlodipine  and losartan  -As needed hydralazine 

## 2024-04-29 NOTE — Progress Notes (Signed)
 Progress Note   Patient: Sarah Sosa FMW:[id number] DOB: 1980-12-20 DOA: 04/27/2024     2 DOS: the patient was seen and examined on 04/29/2024   Brief hospital course: Partly taken from H&P.  Sarah Sosa is a 43 y.o. female with medical history significant of alcohol abuse, chronic pancreatitis, GERD, diet-controlled diabetes, history of aortic valve replacement, history of mechanical valve replacement on warfarin, asthma, medication noncompliance, who presented to the ER with intractable nausea vomiting and abdominal pain.  Symptoms consistent with her prior episodes of pancreatitis.  Patient continued to drink alcohol.  Last drink was earlier this week.  On presentation vital stable, labs with hypokalemia, hypomagnesemia and subtherapeutic INR.  Lipase elevated at 116.  Patient was admitted for acute on chronic alcoholic pancreatitis.  10/13: Vital stable, CIWA score of 1, potassium improved to 3.4, magnesium  at 1.4 Vomiting improved so starting on clear liquid diet.  10/14: Vital stable, mild hypophosphatemia with phosphorus at 2.3.  Still significant pain and nausea, unable to tolerate any advancement of diet, only drinking ginger ale.  Assessment and Plan: * Acute on chronic pancreatitis (HCC) History of alcoholic pancreatitis and patient continued to drink.  Continued to have significant pain, and nausea - Starting on clear liquid diet-does not want any advancement due to persistent pain -Continue with supportive care  Intractable nausea and vomiting Secondary to acute on chronic pancreatitis. Persistent nausea and does not want any food -Continue supportive care  Hypomagnesemia Resolved.  Now has mild hypophosphatemia. - Monitor electrolytes and replete as needed  Alcohol abuse Patient was counseled again. -Continue with CIWA protocol  Hypokalemia Improved, potassium at 3.5 -Continue to monitor and replete as needed  Essential hypertension Blood pressure mildly  elevated - Continue home amlodipine  and losartan  -As needed hydralazine   (HFpEF) heart failure with preserved ejection fraction (HCC) Appears well compensated. - Continue to monitor -Holding spironolactone   GERD (gastroesophageal reflux disease) - Continue PPI  S/p aortic valve replacement with 'On-X valve (INR target of 1.5 to 2) and ascending aortic graft ([facility], 03/2022) Patient was on Coumadin  with subtherapeutic INR on admission.  Per patient she was compliant with medications. - She was currently on heparin  infusion while she was n.p.o. -Switching heparin  with Coumadin  as tolerating clear liquid liquid diet now -Coumadin  per pharmacy  Dyslipidemia - Currently holding Crestor as she was still little nauseated  DM2 (diabetes mellitus, type 2) (HCC) Seems well-controlled, patient has diet controlled diabetes with A1c of 5.6. - Continue to monitor  Continuous dependence on cigarette smoking Counseling was provided. - Nicotine  patch as needed   Subjective: Patient continued to have significant upper abdominal pain and nausea.  She does not want to take any thing else except ginger ale at this time.  Physical Exam: Vitals:   04/28/24 1632 04/28/24 2122 04/29/24 0508 04/29/24 0739  BP: (!) 148/91 136/73 (!) 155/86 (!) 145/96  Pulse: 68 65 60 65  Resp: 18 18 18 17   Temp: 98.1 F (36.7 C) 98.4 F (36.9 C) 98.2 F (36.8 C) 98.4 F (36.9 C)  TempSrc:      SpO2: 97% 99% 98% 99%  Weight:      Height:       General.  Morbidly obese lady, in no acute distress. Pulmonary.  Lungs clear bilaterally, normal respiratory effort. CV.  Regular rate and rhythm, no JVD, rub or murmur. Abdomen.  Soft, tender epigastrium and bilateral upper quadrants, nondistended, BS positive. CNS.  Alert and oriented .  No focal neurologic deficit. Extremities.  No edema, no cyanosis, pulses intact and symmetrical. Psychiatry.  Judgment and insight appears normal.   Data Reviewed: Prior data  reviewed  Family Communication: Discussed with patient  Disposition: Status is: Inpatient Remains inpatient appropriate because: Severity of illness  Planned Discharge Destination: Home  DVT prophylaxis.  Coumadin  Time spent: 50 minutes  This record has been created using Conservation officer, historic buildings. Errors have been sought and corrected,but may not always be located. Such creation errors do not reflect on the standard of care.   Author: Amaryllis Dare, MD 04/29/2024 2:17 PM  For on call review [email or web address].

## 2024-04-29 NOTE — Assessment & Plan Note (Signed)
 Improved, potassium at 3.5 -Continue to monitor and replete as needed

## 2024-04-29 NOTE — Assessment & Plan Note (Signed)
 Secondary to acute on chronic pancreatitis. Persistent nausea and does not want any food -Continue supportive care

## 2024-04-29 NOTE — Progress Notes (Signed)
 PHARMACY - ANTICOAGULATION CONSULT NOTE  Pharmacy Consult for warfarin Indication: Mechanical Valve  Allergies  Allergen Reactions   Latex Anaphylaxis, Hives and Itching    Other reaction(s): Other (See Comments) SKIN BURNING & PEELING    Lisinopril  Swelling    Other Reaction(s): Angioedema   Nsaids Other (See Comments)    GI Ulcers   Tomato Anaphylaxis, Hives and Itching   Gabapentin  Palpitations   Metformin      Other Reaction(s): Abdominal Pain  Causes pancreatitis to worsen   Semaglutide     Other Reaction(s): Other (See Comments)    Acute on chronic pancreatitis 09/2023   Duloxetine Anxiety    Makes her jittery   Tramadol Hives and Itching    Patient Measurements: Height: 5' 6 (167.6 cm) Weight: 110.7 kg (244 lb) IBW/kg (Calculated) : 59.3 HEPARIN  DW (KG): 85.1  Vital Signs: Temp: 98.4 F (36.9 C) (10/14 0739) BP: 145/96 (10/14 0739) Pulse Rate: 65 (10/14 0739)  Labs: Recent Labs    04/27/24 1728 04/27/24 1953 04/28/24 0556 04/28/24 0740 04/29/24 0604  HGB 13.0  --  12.9  --  13.5  HCT 37.8  --  39.1  --  40.5  PLT 215  --  207  --  181  LABPROT  --  19.8*  --  21.1* 18.1*  INR  --  1.6*  --  1.7* 1.4*  HEPARINUNFRC  --   --   --  0.29*  --   CREATININE 0.67  --  0.66  --  0.79    Estimated Creatinine Clearance: 115.5 mL/min (by C-G formula based on SCr of 0.79 mg/dL).   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  Pt was on  warfarin 3.75 mg PO daily PTA,  last dose unknown.   Because of On-X valve , her goal INR is 1.5 - 2.0 (plus daily aspirin ).   Pt presents with pancreatitis and is NPO so cannot take warfarin.   MD wants to anticoagulate with heparin .  Update 10/14: Pt resumed PO intake yesterday and heparin  drip now stopped.  Goal of Therapy: Heparin  level 0.3-0.7 units/ml INR :  1.5 - 2.0 - On-X type mechanical valve Monitor platelets by anticoagulation protocol: Yes  Warfarin Monitoring: 10/12: INR = 1.6 , therapeutic based on target range for On-X valve 10/13: INR = 1.7, therapeutic 10/14: INR = 1.4, slightly subtherapeutic  Date INR Warfarin Dose 10/13 1.7 3.75mg  10/14 1.4 3.75mg   PLANS: Give warfarin 3.75mg  PO x 1 today Continue checking INR daily CBC at least weekly on warfarin   Will M. Lenon, PharmD, BCPS Clinical Pharmacist 04/29/2024 7:51 AM

## 2024-04-29 NOTE — Plan of Care (Signed)
  Problem: Education: Goal: Ability to describe self-care measures that may prevent or decrease complications (Diabetes Survival Skills Education) will improve Outcome: Progressing   Problem: Coping: Goal: Ability to adjust to condition or change in health will improve Outcome: Progressing   Problem: Fluid Volume: Goal: Ability to maintain a balanced intake and output will improve Outcome: Progressing   Problem: Health Behavior/Discharge Planning: Goal: Ability to identify and utilize available resources and services will improve Outcome: Progressing Goal: Ability to manage health-related needs will improve Outcome: Progressing   Problem: Metabolic: Goal: Ability to maintain appropriate glucose levels will improve Outcome: Progressing   Problem: Nutritional: Goal: Maintenance of adequate nutrition will improve Outcome: Progressing Goal: Progress toward achieving an optimal weight will improve Outcome: Progressing   Problem: Skin Integrity: Goal: Risk for impaired skin integrity will decrease Outcome: Progressing   Problem: Tissue Perfusion: Goal: Adequacy of tissue perfusion will improve Outcome: Progressing   Problem: Education: Goal: Knowledge of General Education information will improve Description: Including pain rating scale, medication(s)/side effects and non-pharmacologic comfort measures Outcome: Progressing   Problem: Clinical Measurements: Goal: Ability to maintain clinical measurements within normal limits will improve Outcome: Progressing Goal: Will remain free from infection Outcome: Progressing Goal: Diagnostic test results will improve Outcome: Progressing Goal: Respiratory complications will improve Outcome: Progressing Goal: Cardiovascular complication will be avoided Outcome: Progressing   Problem: Coping: Goal: Level of anxiety will decrease Outcome: Progressing   Problem: Elimination: Goal: Will not experience complications related to  bowel motility Outcome: Progressing Goal: Will not experience complications related to urinary retention Outcome: Progressing   Problem: Safety: Goal: Ability to remain free from injury will improve Outcome: Progressing   Problem: Skin Integrity: Goal: Risk for impaired skin integrity will decrease Outcome: Progressing

## 2024-04-29 NOTE — Assessment & Plan Note (Signed)
 History of alcoholic pancreatitis and patient continued to drink.  Continued to have significant pain, and nausea - Starting on clear liquid diet-does not want any advancement due to persistent pain -Continue with supportive care

## 2024-04-29 NOTE — Assessment & Plan Note (Signed)
 Patient was counseled again. -Continue with CIWA protocol

## 2024-04-29 NOTE — Assessment & Plan Note (Signed)
 Resolved.  Now has mild hypophosphatemia. - Monitor electrolytes and replete as needed

## 2024-04-30 LAB — BASIC METABOLIC PANEL WITH GFR
Anion gap: 14 (ref 5–15)
BUN: <5 mg/dL — ABNORMAL LOW (ref 6–20)
CO2: 23 mmol/L (ref 22–32)
Calcium: 8.7 mg/dL — ABNORMAL LOW (ref 8.9–10.3)
Chloride: 99 mmol/L (ref 98–111)
Creatinine, Ser: 0.6 mg/dL (ref 0.44–1.00)
GFR, Estimated: >60 mL/min (ref >60)
Glucose, Bld: 92 mg/dL (ref 70–99)
Potassium: 3.4 mmol/L — ABNORMAL LOW (ref 3.5–5.1)
Sodium: 136 mmol/L (ref 135–145)

## 2024-04-30 LAB — GLUCOSE, CAPILLARY
Glucose-Capillary: 106 mg/dL — ABNORMAL HIGH (ref 70–99)
Glucose-Capillary: 98 mg/dL (ref 70–99)
Glucose-Capillary: 72 mg/dL (ref 70–99)
Glucose-Capillary: 95 mg/dL (ref 70–99)

## 2024-04-30 LAB — PHOSPHORUS: Phosphorus: 2.7 mg/dL (ref 2.5–4.6)

## 2024-04-30 LAB — MAGNESIUM: Magnesium: 1.6 mg/dL — ABNORMAL LOW (ref 1.7–2.4)

## 2024-04-30 LAB — PROTIME-INR
INR: 1.3 — ABNORMAL HIGH (ref 0.8–1.2)
Prothrombin Time: 16.8 s — ABNORMAL HIGH (ref 11.4–15.2)

## 2024-04-30 MED ORDER — WARFARIN SODIUM 5 MG PO TABS
5.0000 mg | ORAL_TABLET | Freq: Once | ORAL | Status: AC
  Administered 2024-04-30: 5 mg via ORAL
  Filled 2024-04-30: qty 1

## 2024-04-30 MED ORDER — OXYCODONE HCL 5 MG PO TABS
5.0000 mg | ORAL_TABLET | ORAL | Status: DC | PRN
  Administered 2024-04-30 – 2024-05-02 (×7): 5 mg via ORAL
  Filled 2024-04-30 (×7): qty 1

## 2024-04-30 MED ORDER — ATORVASTATIN CALCIUM 20 MG PO TABS
40.0000 mg | ORAL_TABLET | Freq: Every day | ORAL | Status: DC
  Administered 2024-04-30 – 2024-05-02 (×3): 40 mg via ORAL
  Filled 2024-04-30 (×3): qty 2

## 2024-04-30 MED ORDER — ALBUTEROL SULFATE (2.5 MG/3ML) 0.083% IN NEBU: 3.0000 mL | INHALATION_SOLUTION | Freq: Four times a day (QID) | RESPIRATORY_TRACT | Status: DC | PRN

## 2024-04-30 MED ORDER — MAGNESIUM SULFATE 2 GM/50ML IV SOLN
2.0000 g | Freq: Once | INTRAVENOUS | Status: AC
  Administered 2024-04-30: 2 g via INTRAVENOUS
  Filled 2024-04-30: qty 50

## 2024-04-30 MED ORDER — POTASSIUM CHLORIDE 20 MEQ PO PACK
40.0000 meq | PACK | Freq: Once | ORAL | Status: AC
  Administered 2024-04-30: 40 meq via ORAL
  Filled 2024-04-30: qty 2

## 2024-04-30 MED ORDER — OXYCODONE HCL 5 MG PO TABS: 2.5000 mg | ORAL_TABLET | ORAL | Status: DC | PRN

## 2024-04-30 MED ORDER — HYDROMORPHONE HCL 1 MG/ML IJ SOLN
0.6000 mg | INTRAMUSCULAR | Status: DC | PRN
  Administered 2024-04-30 – 2024-05-01 (×4): 0.6 mg via INTRAVENOUS
  Filled 2024-04-30 (×4): qty 1

## 2024-04-30 NOTE — Progress Notes (Signed)
 Progress Note   Patient: Sarah Sosa FMW:[id number] DOB: 1981-01-26 DOA: 04/27/2024     3 DOS: the patient was seen and examined on 04/30/2024   Brief hospital course: Sarah Sosa is a 43 y.o. female with medical history significant of alcohol abuse, chronic pancreatitis, GERD, diet-controlled diabetes, history of aortic valve replacement, history of mechanical valve replacement on warfarin, asthma, medication noncompliance, who presented to the ER with intractable nausea vomiting and abdominal pain.  Symptoms consistent with her prior episodes of pancreatitis.  Patient continued to drink alcohol.  Last drink was earlier this week. Hospital course as below   Assessment and Plan: Acute on chronic pancreatitis (HCC) History of alcoholic pancreatitis and patient continued to drink.  Continued to have significant pain, and nausea -Starting on clear liquid diet-does not want any advancement due to persistent pain -Continue with supportive care, encouraged to minimize use of IV Dilaudid , adjusted meds   Intractable nausea and vomiting - improving Secondary to acute on chronic pancreatitis. Persistent nausea and does not want any food -Continue supportive care   Hypomagnesemia Hypokalemia - Monitor electrolytes and replete as needed   Alcohol abuse Patient was counseled again. -Continue with CIWA protocol   Essential hypertension Blood pressure mildly elevated - Continue home amlodipine  and losartan  -As needed hydralazine    (HFpEF) heart failure with preserved ejection fraction (HCC) Appears well compensated. - Continue to monitor -Holding spironolactone    GERD (gastroesophageal reflux disease) - Continue PPI   S/p aortic valve replacement with 'On-X valve (INR target of 1.5 to 2) and ascending aortic graft ([facility], 03/2022) Patient was on Coumadin  with subtherapeutic INR on admission.  Per patient she was compliant with medications. - She was currently on heparin  infusion while  she was n.p.o. -Switching heparin  with Coumadin  as tolerating clear liquid liquid diet now -Coumadin  per pharmacy   Dyslipidemia - Currently holding Crestor as she was still little nauseated   DM2 (diabetes mellitus, type 2) (HCC) Seems well-controlled, patient has diet controlled diabetes with A1c of 5.6. - Continue to monitor  Chronic pain syndrome - On Buprenorphine  - On po Oxycodone  and IV Dilaudid  as above, will taper   Continuous dependence on cigarette smoking Counseling was provided. - Nicotine  patch as needed   Subjective: Patient continued to have significant upper abdominal pain and nausea.  Refuses diet advancement Encouraged to minimize use of Opioids  Physical Exam: Vitals:   04/29/24 0739 04/29/24 1549 04/29/24 1946 04/30/24 0359  BP: (!) 145/96 131/73 123/72 (!) 122/59  Pulse: 65 71 62 64  Resp: 17 18 18 16   Temp: 98.4 F (36.9 C) 98.1 F (36.7 C) 98.3 F (36.8 C) 98.4 F (36.9 C)  TempSrc:    Oral  SpO2: 99% 94% 96% 92%  Weight:      Height:       General.  Morbidly obese lady, in no acute distress. Pulmonary.  Lungs clear bilaterally, normal respiratory effort. CV.  Regular rate and rhythm, no JVD, rub or murmur. Abdomen.  Soft, tender epigastrium and bilateral upper quadrants, nondistended, BS positive. CNS.  Alert and oriented .  No focal neurologic deficit. Extremities.  No edema, no cyanosis, pulses intact and symmetrical. Psychiatry.  Judgment and insight appears normal.   Data Reviewed: Prior data reviewed  Family Communication: Discussed with patient  Disposition: Status is: Inpatient Remains inpatient appropriate because: Severity of illness  Planned Discharge Destination: Home  DVT prophylaxis.  Coumadin  Time spent: 50 minutes  This record has been created using Sales executive  software. Errors have been sought and corrected,but may not always be located. Such creation errors do not reflect on the standard of care.    Author: Laree Lock, MD 04/30/2024 8:49 AM  For on call review [email or web address].

## 2024-04-30 NOTE — Plan of Care (Signed)

## 2024-04-30 NOTE — TOC CM/SW Note (Signed)
 Transition of Care [facility]) CM/SW Note    Transition of Care [facility]) - Inpatient Brief Assessment   Patient Details  Name: Sarah Sosa MRN: [id number] Date of Birth: Jan 27, 1981  Transition of Care [facility]) CM/SW Contact:    Alvaro Louder, LCSW Phone Number: 04/30/2024, 4:41 PM   Clinical Narrative:  Smoke Cessation information placed in AVS. No other TOC needs at this time. Please consult if they arise.   TOC to follow for discharge  Transition of Care Asessment: Insurance and Status: Insurance coverage has been reviewed Patient has primary care physician: Yes Home environment has been reviewed: Single family home Prior level of function:: Oriented x4 Prior/Current Home Services: No current home services Social Drivers of Health Review: SDOH reviewed needs interventions   Transition of care needs: transition of care needs identified, TOC will continue to follow

## 2024-04-30 NOTE — Progress Notes (Signed)
 PHARMACY - ANTICOAGULATION CONSULT NOTE  Pharmacy Consult for warfarin Indication: Mechanical Valve  Allergies  Allergen Reactions   Latex Anaphylaxis, Hives and Itching    Other reaction(s): Other (See Comments) SKIN BURNING & PEELING    Lisinopril  Swelling    Other Reaction(s): Angioedema   Nsaids Other (See Comments)    GI Ulcers   Tomato Anaphylaxis, Hives and Itching   Gabapentin  Palpitations   Metformin      Other Reaction(s): Abdominal Pain  Causes pancreatitis to worsen   Semaglutide     Other Reaction(s): Other (See Comments)    Acute on chronic pancreatitis 09/2023   Duloxetine Anxiety    Makes her jittery   Tramadol Hives and Itching    Patient Measurements: Height: 5' 6 (167.6 cm) Weight: 110.7 kg (244 lb) IBW/kg (Calculated) : 59.3 HEPARIN  DW (KG): 85.1  Vital Signs: Temp: 98.4 F (36.9 C) (10/15 0359) Temp Source: Oral (10/15 0359) BP: 122/59 (10/15 0359) Pulse Rate: 64 (10/15 0359)  Labs: Recent Labs    04/27/24 1728 04/27/24 1953 04/28/24 0556 04/28/24 0740 04/29/24 0604 04/30/24 0355  HGB 13.0  --  12.9  --  13.5  --   HCT 37.8  --  39.1  --  40.5  --   PLT 215  --  207  --  181  --   LABPROT  --    < >  --  21.1* 18.1* 16.8*  INR  --    < >  --  1.7* 1.4* 1.3*  HEPARINUNFRC  --   --   --  0.29*  --   --   CREATININE 0.67  --  0.66  --  0.79  --    < > = values in this interval not displayed.    Estimated Creatinine Clearance: 115.5 mL/min (by C-G formula based on SCr of 0.79 mg/dL).   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  Pt was on warfarin 3.75 mg PO daily PTA,  last dose unknown.   Because of On-X valve , her goal INR is 1.5 - 2.0 (plus daily aspirin ).   Pt presents with pancreatitis and is NPO so cannot take warfarin.   MD wants to anticoagulate with heparin .  Update 10/14: Pt resumed PO intake yesterday and heparin  drip now stopped.  Goal of Therapy: Heparin  level 0.3-0.7 units/ml INR :  1.5 - 2.0 - On-X type mechanical valve Monitor platelets by anticoagulation protocol: Yes  Warfarin Monitoring: 10/12: INR = 1.6 , therapeutic based on target range for On-X valve 10/13: INR = 1.7, therapeutic 10/14: INR = 1.4, slightly subtherapeutic  Date INR Warfarin Dose 10/13 1.7 3.75mg  10/14 1.4 3.75mg  10/15 1.3 Will order 5mg   PLANS: Give warfarin 5mg  PO x 1 today Continue checking INR daily CBC at least weekly on warfarin   Will M. Lenon, PharmD, BCPS Clinical Pharmacist 04/30/2024 7:41 AM

## 2024-05-01 LAB — MAGNESIUM: Magnesium: 1.7 mg/dL (ref 1.7–2.4)

## 2024-05-01 LAB — BASIC METABOLIC PANEL WITH GFR
Anion gap: 12 (ref 5–15)
BUN: <5 mg/dL — ABNORMAL LOW (ref 6–20)
CO2: 25 mmol/L (ref 22–32)
Calcium: 9.1 mg/dL (ref 8.9–10.3)
Chloride: 102 mmol/L (ref 98–111)
Creatinine, Ser: 0.49 mg/dL (ref 0.44–1.00)
GFR, Estimated: >60 mL/min (ref >60)
Glucose, Bld: 78 mg/dL (ref 70–99)
Potassium: 3.8 mmol/L (ref 3.5–5.1)
Sodium: 139 mmol/L (ref 135–145)

## 2024-05-01 LAB — GLUCOSE, CAPILLARY: Glucose-Capillary: 91 mg/dL (ref 70–99)

## 2024-05-01 LAB — PROTIME-INR
INR: 1.2 (ref 0.8–1.2)
Prothrombin Time: 16 s — ABNORMAL HIGH (ref 11.4–15.2)

## 2024-05-01 MED ORDER — ENOXAPARIN SODIUM 120 MG/0.8ML IJ SOSY
110.0000 mg | PREFILLED_SYRINGE | Freq: Two times a day (BID) | INTRAMUSCULAR | Status: DC
  Administered 2024-05-01 – 2024-05-02 (×3): 110 mg via SUBCUTANEOUS
  Filled 2024-05-01 (×3): qty 0.74

## 2024-05-01 MED ORDER — ASPIRIN 81 MG PO TBEC
81.0000 mg | DELAYED_RELEASE_TABLET | Freq: Every day | ORAL | Status: AC
Start: 2024-05-01 — End: ?
  Administered 2024-05-01 – 2024-05-02 (×2): 81 mg via ORAL
  Filled 2024-05-01 (×2): qty 1

## 2024-05-01 MED ORDER — HYDROMORPHONE HCL 1 MG/ML IJ SOLN
0.4000 mg | INTRAMUSCULAR | Status: DC | PRN
  Administered 2024-05-01: 0.4 mg via INTRAVENOUS
  Filled 2024-05-01: qty 0.5

## 2024-05-01 MED ORDER — WARFARIN SODIUM 6 MG PO TABS
7.0000 mg | ORAL_TABLET | Freq: Once | ORAL | Status: AC
  Administered 2024-05-01: 7 mg via ORAL
  Filled 2024-05-01: qty 1

## 2024-05-01 MED ORDER — HYDROMORPHONE HCL 1 MG/ML IJ SOLN
0.2000 mg | INTRAMUSCULAR | Status: DC | PRN
  Administered 2024-05-01 – 2024-05-02 (×3): 0.2 mg via INTRAVENOUS
  Filled 2024-05-01 (×3): qty 0.5

## 2024-05-01 NOTE — Plan of Care (Signed)

## 2024-05-01 NOTE — Progress Notes (Signed)
 Progress Note   Patient: Sarah Sosa FMW:[id number] DOB: [birth year] DOA: 04/27/2024     4 DOS: the patient was seen and examined on 05/01/2024   Brief hospital course: Sarah Sosa is a 43 y.o. female with medical history significant of alcohol abuse, chronic pancreatitis, GERD, diet-controlled diabetes, history of aortic valve replacement, history of mechanical valve replacement on warfarin, asthma, medication noncompliance, who presented to the ER with intractable nausea vomiting and abdominal pain.  Symptoms consistent with her prior episodes of pancreatitis.  Patient continued to drink alcohol.  Last drink was earlier this week. Hospital course as below   Assessment and Plan: Acute on chronic pancreatitis (HCC) History of alcoholic pancreatitis and patient continued to drink.  Continued to have significant pain, and nausea -Pain improving, advanced to low fat diet -Continue with supportive care, encouraged to minimize use of IV Dilaudid , adjusted meds   Intractable nausea and vomiting - improving Secondary to acute on chronic pancreatitis. Persistent nausea and does not want any food -Continue supportive care   Hypomagnesemia Hypokalemia - Monitor electrolytes and replete as needed   Alcohol abuse Patient was counseled again. -Continue with CIWA protocol   Essential hypertension Blood pressure mildly elevated - Continue home amlodipine  and losartan  -As needed hydralazine    (HFpEF) heart failure with preserved ejection fraction (HCC) Appears well compensated. - Continue to monitor -Holding spironolactone    GERD (gastroesophageal reflux disease) - Continue PPI   S/p aortic valve replacement with 'On-X valve (INR target of 1.5 to 2) and ascending aortic graft ([facility], 03/2022) Patient was on Coumadin  with subtherapeutic INR on admission.  Per patient she was compliant with medications. - Lovenox  bridging as INR subtherapeutic for 3 days now - Coumadin  per pharmacy    Dyslipidemia - Currently holding Crestor as she was still little nauseated   DM2 (diabetes mellitus, type 2) (HCC) Seems well-controlled, patient has diet controlled diabetes with A1c of 5.6. - discontinue monitoring  Chronic pain syndrome - On Buprenorphine  - On po Oxycodone  and IV Dilaudid  as above, will taper   Continuous dependence on cigarette smoking Counseling was provided. - Nicotine  patch as needed   Subjective: Abd pain improving, advance to low fat diet. Minimize IV dilaudid  use Anticipate discharge tomorrow if stable  Physical Exam: Vitals:   04/30/24 2007 05/01/24 0501 05/01/24 0819 05/01/24 1538  BP: 121/75 (!) 150/77 112/62 109/69  Pulse: (!) 59 61 64 61  Resp: 17 18 16 16   Temp: 98.4 F (36.9 C) 98.5 F (36.9 C) 97.6 F (36.4 C) 97.9 F (36.6 C)  TempSrc: Oral Oral    SpO2: 97% 97% 91% 93%  Weight:      Height:       General.  Morbidly obese lady, in no acute distress. Pulmonary.  Lungs clear bilaterally, normal respiratory effort. CV.  Regular rate and rhythm, no JVD, rub or murmur. Abdomen.  Soft, mild epigastric tenderness, nondistended, BS positive. CNS.  Alert and oriented .  No focal neurologic deficit. Extremities.  No edema, no cyanosis, pulses intact and symmetrical. Psychiatry.  Judgment and insight appears normal.   Data Reviewed: Prior data reviewed  Family Communication: Discussed with patient  Disposition: Status is: Inpatient Remains inpatient appropriate because: Severity of illness  Planned Discharge Destination: Home  DVT prophylaxis.  Coumadin  Time spent: 50 minutes  This record has been created using Conservation officer, historic buildings. Errors have been sought and corrected,but may not always be located. Such creation errors do not reflect on the standard of care.  Author: Laree Lock, MD 05/01/2024 4:38 PM  For on call review [email or web address].

## 2024-05-01 NOTE — Plan of Care (Signed)
  Problem: Education: Goal: Ability to describe self-care measures that may prevent or decrease complications (Diabetes Survival Skills Education) will improve Outcome: Progressing   Problem: Coping: Goal: Ability to adjust to condition or change in health will improve Outcome: Progressing   Problem: Fluid Volume: Goal: Ability to maintain a balanced intake and output will improve Outcome: Progressing   Problem: Health Behavior/Discharge Planning: Goal: Ability to identify and utilize available resources and services will improve Outcome: Progressing Goal: Ability to manage health-related needs will improve Outcome: Progressing   Problem: Metabolic: Goal: Ability to maintain appropriate glucose levels will improve Outcome: Progressing   Problem: Nutritional: Goal: Maintenance of adequate nutrition will improve Outcome: Progressing Goal: Progress toward achieving an optimal weight will improve Outcome: Progressing   Problem: Skin Integrity: Goal: Risk for impaired skin integrity will decrease Outcome: Progressing   Problem: Tissue Perfusion: Goal: Adequacy of tissue perfusion will improve Outcome: Progressing   Problem: Education: Goal: Knowledge of General Education information will improve Description: Including pain rating scale, medication(s)/side effects and non-pharmacologic comfort measures Outcome: Progressing   Problem: Clinical Measurements: Goal: Ability to maintain clinical measurements within normal limits will improve Outcome: Progressing Goal: Will remain free from infection Outcome: Progressing Goal: Diagnostic test results will improve Outcome: Progressing Goal: Respiratory complications will improve Outcome: Progressing Goal: Cardiovascular complication will be avoided Outcome: Progressing   Problem: Activity: Goal: Risk for activity intolerance will decrease Outcome: Progressing   Problem: Nutrition: Goal: Adequate nutrition will be  maintained Outcome: Progressing   Problem: Coping: Goal: Level of anxiety will decrease Outcome: Progressing   Problem: Elimination: Goal: Will not experience complications related to bowel motility Outcome: Progressing Goal: Will not experience complications related to urinary retention Outcome: Progressing   Problem: Pain Managment: Goal: General experience of comfort will improve and/or be controlled Outcome: Progressing   Problem: Safety: Goal: Ability to remain free from injury will improve Outcome: Progressing

## 2024-05-01 NOTE — Progress Notes (Addendum)
 PHARMACY - ANTICOAGULATION CONSULT NOTE  Pharmacy Consult for warfarin Indication: Mechanical Valve  Allergies  Allergen Reactions   Latex Anaphylaxis, Hives and Itching    Other reaction(s): Other (See Comments) SKIN BURNING & PEELING    Lisinopril  Swelling    Other Reaction(s): Angioedema   Nsaids Other (See Comments)    GI Ulcers   Tomato Anaphylaxis, Hives and Itching   Gabapentin  Palpitations   Metformin      Other Reaction(s): Abdominal Pain  Causes pancreatitis to worsen   Semaglutide     Other Reaction(s): Other (See Comments)    Acute on chronic pancreatitis 09/2023   Duloxetine Anxiety    Makes her jittery   Tramadol Hives and Itching    Patient Measurements: Height: 5' 6 (167.6 cm) Weight: 110.7 kg (244 lb) IBW/kg (Calculated) : 59.3 HEPARIN  DW (KG): 85.1  Vital Signs: Temp: 98.5 F (36.9 C) (10/16 0501) Temp Source: Oral (10/16 0501) BP: 150/77 (10/16 0501) Pulse Rate: 61 (10/16 0501)  Labs: Recent Labs    04/28/24 0740 04/29/24 0604 04/30/24 0355 05/01/24 0448  HGB  --  13.5  --   --   HCT  --  40.5  --   --   PLT  --  181  --   --   LABPROT 21.1* 18.1* 16.8* 16.0*  INR 1.7* 1.4* 1.3* 1.2  HEPARINUNFRC 0.29*  --   --   --   CREATININE  --  0.79 0.60 0.49    Estimated Creatinine Clearance: 115.5 mL/min (by C-G formula based on SCr of 0.49 mg/dL).   Medical History: Past Medical History:  Diagnosis Date   Arthritis    Asthma    Chronic pain disorder 03/19/2018   GERD (gastroesophageal reflux disease)    Headache    Heart murmur    History of trichomoniasis 03/19/2018   Hypertension    Intractable nausea and vomiting    Pancreatitis    PID (pelvic inflammatory disease) 02/14/2018   Uterine leiomyoma 03/19/2018    Medications:  Warfarin 3.75mg  PO daily (has 7.5mg  tablets at home) Last dose prior to admission 10/12 Aspirin  81mg  PO daily  ASSESSMENT: Pharmacy consulted to dose heparin  in this 43 year old female with On-X  mechanical heart valve.  Pt was on warfarin 3.75 mg PO daily PTA,  last dose unknown.   Because of On-X valve , her goal INR is 1.5 - 2.0 (plus daily aspirin ).   Pt presents with pancreatitis and is NPO so cannot take warfarin.   MD wants to anticoagulate with heparin .  10/14: Pt resumed PO intake yesterday and heparin  drip now stopped.  Update 10/16: Adding Lovenox  due to 3 days of subtherapeutic INR.  Goal of Therapy: Heparin  level 0.3-0.7 units/ml INR :  1.5 - 2.0 - On-X type mechanical valve Monitor platelets by anticoagulation protocol: Yes  Date INR Warfarin Dose 10/13 1.7 3.75mg  10/14 1.4 3.75mg  10/15 1.3 5mg  10/16 1.2 7mg   PLANS: She has 7.5 mg tablets at home and per anticoag clinic notes from this year, her regimen has at times included 7.5 mg dose on 1-2 days per week. Will use the 7.5 mg as a guide when increasing the dose Give warfarin 7 mg PO x 1 today Initiate enoxaparin  1 mg/kg subQ q12H Continue checking INR daily CBC at least every 3 days   Will M. Lenon, PharmD, BCPS Clinical Pharmacist 05/01/2024 7:34 AM

## 2024-05-02 ENCOUNTER — Other Ambulatory Visit: Payer: Self-pay

## 2024-05-02 LAB — CBC
HCT: 33.3 % — ABNORMAL LOW (ref 36.0–46.0)
Hemoglobin: 11.1 g/dL — ABNORMAL LOW (ref 12.0–15.0)
MCH: 31.7 pg (ref 26.0–34.0)
MCHC: 33.3 g/dL (ref 30.0–36.0)
MCV: 95.1 fL (ref 80.0–100.0)
Platelets: 223 K/uL (ref 150–400)
RBC: 3.5 MIL/uL — ABNORMAL LOW (ref 3.87–5.11)
RDW: 14.3 % (ref 11.5–15.5)
WBC: 4.8 K/uL (ref 4.0–10.5)
nRBC: 0 % (ref 0.0–0.2)

## 2024-05-02 LAB — BASIC METABOLIC PANEL WITH GFR
Anion gap: 11 (ref 5–15)
BUN: 8 mg/dL (ref 6–20)
CO2: 26 mmol/L (ref 22–32)
Calcium: 9.2 mg/dL (ref 8.9–10.3)
Chloride: 101 mmol/L (ref 98–111)
Creatinine, Ser: 0.64 mg/dL (ref 0.44–1.00)
GFR, Estimated: >60 mL/min (ref >60)
Glucose, Bld: 122 mg/dL — ABNORMAL HIGH (ref 70–99)
Potassium: 3.4 mmol/L — ABNORMAL LOW (ref 3.5–5.1)
Sodium: 138 mmol/L (ref 135–145)

## 2024-05-02 LAB — PROTIME-INR
INR: 1.3 — ABNORMAL HIGH (ref 0.8–1.2)
Prothrombin Time: 17.3 s — ABNORMAL HIGH (ref 11.4–15.2)

## 2024-05-02 LAB — MAGNESIUM: Magnesium: 1.3 mg/dL — ABNORMAL LOW (ref 1.7–2.4)

## 2024-05-02 MED ORDER — WARFARIN SODIUM 7.5 MG PO TABS: 3.7500 mg | ORAL_TABLET | Freq: Every day | ORAL | Status: AC

## 2024-05-02 MED ORDER — WARFARIN SODIUM 7.5 MG PO TABS: 7.5000 mg | ORAL_TABLET | Freq: Once | ORAL | Status: DC

## 2024-05-02 MED ORDER — MAGNESIUM SULFATE 4 GM/100ML IV SOLN
4.0000 g | Freq: Once | INTRAVENOUS | Status: AC
  Administered 2024-05-02: 4 g via INTRAVENOUS
  Filled 2024-05-02: qty 100

## 2024-05-02 MED ORDER — WARFARIN SODIUM 5 MG PO TABS
5.0000 mg | ORAL_TABLET | Freq: Once | ORAL | Status: DC
  Filled 2024-05-02: qty 1

## 2024-05-02 MED ORDER — OXYCODONE HCL 5 MG PO TABS
5.0000 mg | ORAL_TABLET | Freq: Four times a day (QID) | ORAL | 0 refills | Status: AC | PRN
  Filled 2024-05-02: qty 12, 3d supply, fill #0

## 2024-05-02 MED ORDER — POTASSIUM CHLORIDE 20 MEQ PO PACK
40.0000 meq | PACK | Freq: Once | ORAL | Status: AC
  Administered 2024-05-02: 40 meq via ORAL
  Filled 2024-05-02: qty 2

## 2024-05-02 MED ORDER — ENOXAPARIN SODIUM 120 MG/0.8ML IJ SOSY
110.0000 mg | PREFILLED_SYRINGE | Freq: Two times a day (BID) | INTRAMUSCULAR | 0 refills | Status: DC
  Filled 2024-05-02: qty 6.4, 4d supply, fill #0

## 2024-05-02 NOTE — Discharge Summary (Signed)
 Physician Discharge Summary   Patient: Sarah Sosa MRN: [id number] DOB: Oct 26, 1980  Admit date:     04/27/2024  Discharge date: 05/02/24  Discharge Physician: Laree Lock   PCP: Clinic, [facility]   Recommendations at discharge:   Follow up with PCP - Repeat BMP, Mag, CBC in 5 days Follow up with [facility] in 3 days - INR check, on Lovenox  bridging  Discharge Diagnoses: Principal Problem:   Acute on chronic pancreatitis [facility]) Active Problems:   Intractable nausea and vomiting   Hypokalemia   Alcohol abuse   Hypomagnesemia   Essential hypertension   (HFpEF) heart failure with preserved ejection fraction (HCC)   GERD (gastroesophageal reflux disease)   S/p aortic valve replacement with 'On-X valve (INR target of 1.5 to 2) and ascending aortic graft ([facility], 03/2022)   Dyslipidemia   DM2 (diabetes mellitus, type 2) (HCC)   Continuous dependence on cigarette smoking   Hospital Course: Sarah Sosa is a 43 y.o. female with medical history significant of alcohol abuse, chronic pancreatitis, GERD, diet-controlled diabetes, history of aortic valve replacement, history of mechanical valve replacement on warfarin, asthma, medication noncompliance, who presented to the ER with intractable nausea vomiting and abdominal pain.  Symptoms consistent with her prior episodes of pancreatitis.  Patient continued to drink alcohol.  Last drink was earlier this week. Hospital course as below   Acute on chronic pancreatitis History of alcoholic pancreatitis and patient continued to drink -Pain improving, tolerating low fat diet -Alcohol cessation counselling   Hypomagnesemia Hypokalemia - Repleted. On potassium and Mag supplements at home - Follow up with PCP with repeat labs   Alcohol abuse Patient was counseled again  S/p aortic valve replacement with 'On-X valve (INR target of 1.5 to 2) and ascending aortic graft ([facility], 03/2022) Patient was on Coumadin  with  subtherapeutic INR since 4 days.  Per patient she was compliant with medications. - Lovenox  bridging as INR subtherapeutic. Inc Coumadin  dose 7.5mg  today, followed by daily 3.75mg  - Follow up with [facility] clinic in 3 days  Chronic pain syndrome - On Buprenorphine . Sent Oxycodone  12 tabs, as patient ran out of oxycodone  she refilled earlier   Essential hypertension   (HFpEF) heart failure with preserved ejection fraction Appears well compensated   GERD (gastroesophageal reflux disease)   Dyslipidemia   DM2 (diabetes mellitus, type 2)   Continuous dependence on cigarette smoking   Pain control - [place]  Controlled Substance Reporting System database was reviewed. and patient was instructed, not to drive, operate heavy machinery, perform activities at heights, swimming or participation in water activities or provide baby-sitting services while on Pain, Sleep and Anxiety Medications; until their outpatient Physician has advised to do so again. Also recommended to not to take more than prescribed Pain, Sleep and Anxiety Medications.   Consultants: None Procedures performed: None  Disposition: Home Diet recommendation:  Low fat diet DISCHARGE MEDICATION: Allergies as of 05/02/2024       Reactions   Latex Anaphylaxis, Hives, Itching   Other reaction(s): Other (See Comments) SKIN BURNING & PEELING   Lisinopril  Swelling   Other Reaction(s): Angioedema   Nsaids Other (See Comments)   GI Ulcers   Tomato Anaphylaxis, Hives, Itching   Gabapentin  Palpitations   Metformin     Other Reaction(s): Abdominal Pain Causes pancreatitis to worsen   Semaglutide    Other Reaction(s): Other (See Comments)    Acute on chronic pancreatitis 09/2023   Duloxetine Anxiety   Makes her jittery   Tramadol Hives,  Itching        Medication List     TAKE these medications    albuterol  108 (90 Base) MCG/ACT inhaler Commonly known as: VENTOLIN  HFA Inhale 1-2 puffs into the  lungs every 6 (six) hours as needed.   amitriptyline  10 MG tablet Commonly known as: ELAVIL  Take 10 mg by mouth 3 (three) times daily as needed for sleep (or pain).   amLODipine  10 MG tablet Commonly known as: NORVASC  Take 1 tablet (10 mg total) by mouth daily.   atorvastatin  40 MG tablet Commonly known as: LIPITOR Take 40 mg by mouth daily.   buprenorphine  8 MG Subl SL tablet Commonly known as: SUBUTEX  Place 8 mg under the tongue. Place 1 tablet (8 mg total) under the tongue 3 (three) times daily   enoxaparin  120 MG/0.8ML injection Commonly known as: LOVENOX  Inject 0.74 mLs (110 mg total) into the skin every 12 (twelve) hours for 4 days.   furosemide  40 MG tablet Commonly known as: LASIX  Take 0.5 tablets (20 mg total) by mouth daily as needed. Home med. What changed:  how much to take reasons to take this   lipase/protease/amylase 63999 UNITS Cpep capsule Commonly known as: CREON  Take 1 capsule (36,000 Units total) by mouth 3 (three) times daily with meals.   losartan  50 MG tablet Commonly known as: COZAAR  Take 1 tablet (50 mg total) by mouth daily.   magnesium  chloride 64 MG Tbec SR tablet Commonly known as: SLOW-MAG Take 1 tablet (64 mg total) by mouth daily.   metFORMIN  500 MG tablet Commonly known as: GLUCOPHAGE  Take 500 mg by mouth every morning.   naloxone  4 MG/0.1ML Liqd nasal spray kit Commonly known as: NARCAN  Place 1 spray into the nose once as needed (to reverse overdose).   omeprazole 40 MG capsule Commonly known as: PRILOSEC Take 40 mg by mouth daily.   ondansetron  4 MG disintegrating tablet Commonly known as: ZOFRAN -ODT Take 4 mg by mouth every 8 (eight) hours as needed for nausea.   oxyCODONE  5 MG immediate release tablet Commonly known as: Oxy IR/ROXICODONE  Take 1 tablet (5 mg total) by mouth every 6 (six) hours as needed for up to 3 days for moderate pain (pain score 4-6) or severe pain (pain score 7-10).   potassium chloride  20 MEQ/15ML  (10%) Soln Take 20 mEq by mouth 2 (two) times daily.   spironolactone  25 MG tablet Commonly known as: ALDACTONE  Take 25 mg by mouth daily.   traZODone  50 MG tablet Commonly known as: DESYREL  Take 100 mg by mouth at bedtime.   warfarin 7.5 MG tablet Commonly known as: COUMADIN  Take 1 tablet (7.5 mg total) by mouth once for 1 dose. What changed: You were already taking a medication with the same name, and this prescription was added. Make sure you understand how and when to take each.   warfarin 7.5 MG tablet Commonly known as: COUMADIN  Take 0.5 tablets (3.75 mg total) by mouth daily. Start taking on: May 03, 2024 What changed: Another medication with the same name was added. Make sure you understand how and when to take each.        Discharge Exam: Filed Weights   04/27/24 1704  Weight: 110.7 kg   General.  Morbidly obese lady, in no acute distress Pulmonary.  Lungs clear bilaterally, normal respiratory effort CV.  Regular rate and rhythm, no JVD, rub or murmur Abdomen.  Soft, mild epigastric tenderness, nondistended, BS positive CNS.  Alert and oriented .  No focal neurologic  deficit Extremities.  No edema, no cyanosis, pulses intact and symmetrical Psychiatry.  Judgment and insight appears normal  Condition at discharge: good  The results of significant diagnostics from this hospitalization (including imaging, microbiology, ancillary and laboratory) are listed below for reference.   Imaging Studies: No results found.  Microbiology: Results for orders placed or performed during the hospital encounter of 08/03/23  Resp panel by RT-PCR (RSV, Flu A&B, Covid) Anterior Nasal Swab     Status: None   Collection Time: 08/03/23 10:11 AM   Specimen: Anterior Nasal Swab  Result Value Ref Range Status   SARS Coronavirus 2 by RT PCR NEGATIVE NEGATIVE Final    Comment: (NOTE) SARS-CoV-2 target nucleic acids are NOT DETECTED.  The SARS-CoV-2 RNA is generally detectable in  upper respiratory specimens during the acute phase of infection. The lowest concentration of SARS-CoV-2 viral copies this assay can detect is 138 copies/mL. A negative result does not preclude SARS-Cov-2 infection and should not be used as the sole basis for treatment or other patient management decisions. A negative result may occur with  improper specimen collection/handling, submission of specimen other than nasopharyngeal swab, presence of viral mutation(s) within the areas targeted by this assay, and inadequate number of viral copies(<138 copies/mL). A negative result must be combined with clinical observations, patient history, and epidemiological information. The expected result is Negative.  Fact Sheet for Patients:  BloggerCourse.com  Fact Sheet for Healthcare Providers:  SeriousBroker.it  This test is no t yet approved or cleared by the [place]  FDA and  has been authorized for detection and/or diagnosis of SARS-CoV-2 by FDA under an Emergency Use Authorization (EUA). This EUA will remain  in effect (meaning this test can be used) for the duration of the COVID-19 declaration under Section 564(b)(1) of the Act, 21 U.S.C.section 360bbb-3(b)(1), unless the authorization is terminated  or revoked sooner.       Influenza A by PCR NEGATIVE NEGATIVE Final   Influenza B by PCR NEGATIVE NEGATIVE Final    Comment: (NOTE) The Xpert Xpress SARS-CoV-2/FLU/RSV plus assay is intended as an aid in the diagnosis of influenza from Nasopharyngeal swab specimens and should not be used as a sole basis for treatment. Nasal washings and aspirates are unacceptable for Xpert Xpress SARS-CoV-2/FLU/RSV testing.  Fact Sheet for Patients: BloggerCourse.com  Fact Sheet for Healthcare Providers: SeriousBroker.it  This test is not yet approved or cleared by the [place]  FDA and has been  authorized for detection and/or diagnosis of SARS-CoV-2 by FDA under an Emergency Use Authorization (EUA). This EUA will remain in effect (meaning this test can be used) for the duration of the COVID-19 declaration under Section 564(b)(1) of the Act, 21 U.S.C. section 360bbb-3(b)(1), unless the authorization is terminated or revoked.     Resp Syncytial Virus by PCR NEGATIVE NEGATIVE Final    Comment: (NOTE) Fact Sheet for Patients: BloggerCourse.com  Fact Sheet for Healthcare Providers: SeriousBroker.it  This test is not yet approved or cleared by the [place]  FDA and has been authorized for detection and/or diagnosis of SARS-CoV-2 by FDA under an Emergency Use Authorization (EUA). This EUA will remain in effect (meaning this test can be used) for the duration of the COVID-19 declaration under Section 564(b)(1) of the Act, 21 U.S.C. section 360bbb-3(b)(1), unless the authorization is terminated or revoked.  Performed at [facility], [address] [place], [place] [address]     Labs: CBC: Recent Labs  Lab 04/27/24 1728 04/28/24 9443 04/29/24 0604 05/02/24 9244  WBC 4.6 6.1 4.0 4.8  HGB 13.0 12.9 13.5 11.1*  HCT 37.8 39.1 40.5 33.3*  MCV 92.6 94.2 94.2 95.1  PLT 215 207 181 223   Basic Metabolic Panel: Recent Labs  Lab 04/28/24 0556 04/28/24 0740 04/29/24 0604 04/30/24 0355 05/01/24 0448 05/02/24 0755  NA 136  --  137 136 139 138  K 3.4*  --  3.5 3.4* 3.8 3.4*  CL 96*  --  99 99 102 101  CO2 25  --  24 23 25 26   GLUCOSE 119*  --  104* 92 78 122*  BUN <5*  --  <5* <5* <5* 8  CREATININE 0.66  --  0.79 0.60 0.49 0.64  CALCIUM  8.3*  --  8.9 8.7* 9.1 9.2  MG  --  1.4* 2.1 1.6* 1.7 1.3*  PHOS  --   --  2.3* 2.7  --   --    Liver Function Tests: Recent Labs  Lab 04/27/24 1728 04/28/24 0556 04/29/24 0604  AST 30 27  --   ALT 14 13  --   ALKPHOS 78 74  --   BILITOT 0.5 1.0  --   PROT 6.9  7.1  --   ALBUMIN 3.8 3.7 4.0   CBG: Recent Labs  Lab 04/30/24 0904 04/30/24 1132 04/30/24 1606 04/30/24 2119 05/01/24 0820  GLUCAP 106* 98 72 95 91    Discharge time spent: greater than 30 minutes.  Signed: Laree Lock, MD [facility] 05/02/2024

## 2024-05-02 NOTE — Progress Notes (Signed)
 PHARMACY - ANTICOAGULATION CONSULT NOTE  Pharmacy Consult for warfarin Indication: Mechanical Valve  Allergies  Allergen Reactions   Latex Anaphylaxis, Hives and Itching    Other reaction(s): Other (See Comments) SKIN BURNING & PEELING    Lisinopril  Swelling    Other Reaction(s): Angioedema   Nsaids Other (See Comments)    GI Ulcers   Tomato Anaphylaxis, Hives and Itching   Gabapentin  Palpitations   Metformin      Other Reaction(s): Abdominal Pain  Causes pancreatitis to worsen   Semaglutide     Other Reaction(s): Other (See Comments)    Acute on chronic pancreatitis 09/2023   Duloxetine Anxiety    Makes her jittery   Tramadol Hives and Itching    Patient Measurements: Height: 5' 6 (167.6 cm) Weight: 110.7 kg (244 lb) IBW/kg (Calculated) : 59.3 HEPARIN  DW (KG): 85.1  Vital Signs: Temp: 97.6 F (36.4 C) (10/17 0503) BP: 158/81 (10/17 0503) Pulse Rate: 48 (10/17 0503)  Labs: Recent Labs    04/30/24 0355 05/01/24 0448 05/02/24 0755  HGB  --   --  11.1*  HCT  --   --  33.3*  PLT  --   --  223  LABPROT 16.8* 16.0* 17.3*  INR 1.3* 1.2 1.3*  CREATININE 0.60 0.49 0.64    Estimated Creatinine Clearance: 115.5 mL/min (by C-G formula based on SCr of 0.64 mg/dL).   Medical History: Past Medical History:  Diagnosis Date   Arthritis    Asthma    Chronic pain disorder 03/19/2018   GERD (gastroesophageal reflux disease)    Headache    Heart murmur    History of trichomoniasis 03/19/2018   Hypertension    Intractable nausea and vomiting    Pancreatitis    PID (pelvic inflammatory disease) 02/14/2018   Uterine leiomyoma 03/19/2018    Medications:  Warfarin 3.75mg  PO daily (has 7.5mg  tablets at home) Last dose prior to admission 10/12 Aspirin  81mg  PO daily  ASSESSMENT: Pharmacy consulted to dose heparin  in this 43 year old female with On-X mechanical heart valve.  Pt was on warfarin 3.75 mg PO daily PTA,  last dose unknown.   Because of On-X valve ,  her goal INR is 1.5 - 2.0 (plus daily aspirin ).   Pt presents with pancreatitis and is NPO so cannot take warfarin.   MD wants to anticoagulate with heparin .  10/14: Pt resumed PO intake yesterday and heparin  drip now stopped.  Update 10/16: Adding Lovenox  due to 3 days of subtherapeutic INR.  Goal of Therapy: Heparin  level 0.3-0.7 units/ml INR :  1.5-2.0 -- On-X type mechanical valve Monitor platelets by anticoagulation protocol: Yes  Date INR Warfarin Dose 10/13 1.7 3.75mg  10/14 1.4 3.75mg  10/15 1.3 5mg  10/16 1.2 7mg  10/17 1.3 5mg   PLANS: She has 7.5 mg tablets at home and per anticoag clinic notes from this year, her regimen has at times included 7.5 mg dose on 1-2 days per week. INR today likely not yet fully reflective of 7mg  dose from 10/16. Give warfarin 5 mg PO x 1 today Continue enoxaparin  1 mg/kg subQ q12H while INR subtherapeutic Continue checking INR daily CBC at least every 3 days   Will M. Lenon, PharmD, BCPS Clinical Pharmacist 05/02/2024 8:50 AM

## 2024-05-14 NOTE — ED Provider Notes (Signed)
 [facility] Provider Note Room: A 08/A 08  Medical Decision Making   Medical Decision Making 43 year old female with a history of recurrent hypokalemia and hypomagnesemia presented with muscle spasms in the left arm and generalized fatigue. She reported chronic issues with electrolyte replacement and was advised by her outpatient provider to seek emergency care after abnormal laboratory results.    - Order repeat laboratory tests to confirm magnesium  and potassium levels - Order EKG to assess cardiac electrical activity - Anticipate hospital admission for overnight monitoring and electrolyte replacement   Progress Notes     ED Course as of 05/15/24 0130  Wed May 14, 2024  2210 Patient states she is unable to take tablet form of potassium and would like the packet.  Heart rate has improved to upper 80s while sitting in the chair     MDM Elements Discussion of Management with other Physicians, QHP or Appropriate Source: Admitting team: Hospitalist  Independent Interpretation of Studies: EKG(s):    I have reviewed recent and relevant previous record, including: Outpatient labs & studies: Outpatient potassium and magnesium  reviewed            Disposition   Clinical Impression:     Diagnosis Comment Added By Time Added   Hypomagnesemia  Saddie Llano, DO 05/14/2024 10:07 PM   Hypokalemia  Saddie Llano, DO 05/14/2024 10:08 PM   Muscle cramping  Saddie Llano, DO 05/14/2024 10:08 PM   Fatigue, unspecified type  Saddie Llano, DO 05/14/2024 10:08 PM      Final Disposition: Admit  History   Chief Complaint in Triage  Patient presents with  . Abnormal Lab    Pt presents after getting a call back from their primary care doctor for abnormal potassium and magnesium  both being low.     History of Present Illness Sarah Sosa is a 43 year old female who presents with electrolyte imbalances and muscle spasms. She was referred by a  healthcare provider due to low potassium and magnesium  levels.  She reports a history of requiring potassium and magnesium  replacement in the past. She reports taking a daily potassium supplement in the form of an orange drink mixed with a beverage like Sprite. She has attempted to take over-the-counter magnesium  but reports discontinuing it after her numbers had improved.   She experiences spasms in her left arm, described as a 'frog jumping' sensation, which have been persistent and are accompanied by generalized weakness and fatigue.   No fever, fatigue, nausea, vomiting, diarrhea, or dysuria.  Addition Historian(s):      MEDICATIONS:  Patient's Medications  Previous Medications   ACETAMINOPHEN  (TYLENOL ) 80 MG CHEWABLE TABLET    Chew 500 mg 3 (three) times a day as needed. 2 TABS TID PRN   AMITRIPTYLINE  (ELAVIL ) 10 MG TABLET    Take 1 tablet (10 mg total) by mouth nightly.   AMLODIPINE  (NORVASC ) 10 MG TABLET    Take 1 tablet (10 mg total) by mouth nightly. Lastdose 10/28 AT hs   ASPIRIN  81 MG CHEWABLE TABLET    Chew 1 tablet (81 mg total) daily.   ATORVASTATIN  (LIPITOR) 40 MG TABLET    Take 1 tablet (40 mg total) by mouth daily.   BUPRENORPHINE  HCL (SUBUTEX ) 8 MG SUBL SL TABLET    Place 1 tablet (8 mg total) under the tongue 3 (three) times a day.   CREON  36,000-114,000- 180,000 UNIT CPDR    Take Two capsule by mouth before each meal. Take One capsule before  each snack.   FUROSEMIDE  (LASIX ) 40 MG TABLET    Take 1 tablet (40 mg total) by mouth daily as needed.   LOSARTAN  (COZAAR ) 50 MG TABLET    Take 1 tablet (50 mg total) by mouth daily.   METFORMIN  (GLUCOPHAGE ) 500 MG TABLET    Take 1 tablet (500 mg total) by mouth daily with breakfast.   OMEPRAZOLE (PRILOSEC) 40 MG CAPSULE    Take 1 capsule (40 mg total) by mouth daily.   OXYCODONE  (ROXICODONE ) 5 MG IMMEDIATE RELEASE TABLET    Take 1 tablet (5 mg total) by mouth 4 (four) times a day. 5-10 mg every for hours   SPIRONOLACTONE   (ALDACTONE ) 25 MG TABLET    Take 1 tablet (25 mg total) by mouth nightly. Last dose 10/28 at night time   TRAZODONE  (DESYREL ) 50 MG TABLET    Take 2 tablets (100 mg total) by mouth nightly.   WARFARIN (COUMADIN ) 7.5 MG TABLET    Take 3.25 mg by mouth nightly. Note dose and timing Take half tablet (total dose is 3.25 mg) every night  Modified Medications   No medications on file    ALLERGIES: Allergies[1]  PAST MEDICAL HISTORY:  Past Medical History[2]  PAST SURGICAL HISTORY: Past Surgical History[3]  SOCIAL HISTORY:  Social History   Tobacco Use  . Smoking status: Every Day    Types: Cigarettes  . Smokeless tobacco: Never  Substance Use Topics  . Alcohol use: Yes     Social Drivers of Health with Concerns   Tobacco Use: High Risk (05/14/2024)  Alcohol Use: Unknown (04/10/2023)   FAMILY HISTORY:  Family History[4]    Physical Exam    ED Triage Vitals [05/14/24 1931]  Temp 97.4 F (36.3 C)  Temp Source Oral  Heart Rate 113  BP 135/75  Respirations 18  SpO2 97 %   Reviewed vital signs and nursing note as charted by RN.  Physical Exam CONSTITUTIONAL: Well appearing, well nourished, alert and oriented and responds appropriately to questions. HEAD: Normocephalic, appears atraumatic. EYES: Conjunctivae clear, sclerae non icteric. ENT: Moist mucous membranes. NECK: Patient is ranging it without evidence of meningismus. CARDIOVASCULAR: Skin is well perfused, no jugular venous distention.  S1-S2 with no rubs gallops or murmurs RESPIRATORY: Normal chest excursion without splinting or tachypnea.  No wheezes rales or rhonchi  ABDOMINAL: Nondistended.  Soft nontender with no rebound or guarding. EXTREMITIES: No long bone deformities. SKIN: Normal color for age and race, warm, dry, no acute lesions noted. NEUROLOGICAL: Alert, oriented, moves all extremities. PSYCHIATRIC: The patient's mood and manner are appropriate, grooming and personal hygiene are appropriate.       Results    I personally reviewed the results in the chart as listed below  Results     Results for orders placed or performed during the hospital encounter of 05/14/24  CBC with Differential  Result Value Ref Range   Differential Percent Diff %    Differential Absolute Diff Absolute    WBC 7.3 3.6 - 11.2 K/uL   RBC 4.09 3.63 - 4.92 M/uL   Hemoglobin 13.1 10.9 - 14.3 g/dL   Hematocrit 39 31 - 42 %   Mean Cell Volume 95 74 - 96 fL   Mean Cell Hemoglobin 32 24 - 33 pg   Mean Cell Hemoglobin Concentration 34 33 - 36 g/dL   RDW 84.8 87.6 - 82.9 %   Platelet Count 285 150 - 450 K/uL   Mean Platelet Volume 8.6 7.5 - 11.2 fL  Neutrophils 54 43 - 77 %   Lymphocytes 37 16 - 44 %   Monocytes 4 (L) 5 - 13 %   Eosinophils 4 1 - 8 %   Basophils 1 0 - 1 %   Neutrophils Absolute 3.9 1.8 - 7.8 K/uL   Lymphocytes Absolute 2.7 1.0 - 3.0 K/uL   Monocytes Absolute 0.3 0.3 - 1.0 K/uL   Eosinophils Absolute 0.3 0.0 - 0.5 K/uL   Basophils Absolute 0.1 0.0 - 0.1 K/uL   Nucleated RBCS 0 /100 WBC  BMP  Result Value Ref Range   Sodium 139 136 - 145 mmol/L   Potassium 3.1 (L) 3.5 - 5.1 mmol/L   Chloride 101 98 - 107 mmol/L   CO2 24 21 - 31 mmol/L   BUN 12 7 - 25 mg/dL   Creatinine 9.07 9.39 - 1.20 mg/dL   Glucose, Random 880 70 - 199 mg/dL   Calcium , Total 8.3 (L) 8.6 - 10.3 mg/dL   Osmolality (calculated) 278 270 - 295 mOsm/kg   Anion Gap 14 (H) 4 - 12  Magnesium  Level  Result Value Ref Range   Magnesium  0.9 (LL) 1.7 - 2.4 mg/dL  eGFR (CKD-EPI)  Result Value Ref Range   eGFR >60 mL/min/1.66m2  PT/INR  Result Value Ref Range   PT 19.8 (H) 9.9 - 12.7 sec   INR 1.71 <1.30  Lab Add On  Result Value Ref Range   Lab Add On PROCESSED   CK  Result Value Ref Range   CK 90 30 - 223 IU/L  Phosphorus  Result Value Ref Range   Phosphorus 4.2 2.5 - 5.0 mg/dL  Hepatic Function Panel  Result Value Ref Range   Albumin 4.4 3.5 - 5.7 g/dL   Bilirubin, Total 0.3 0.3 - 1.0 mg/dL    Bilirubin, Direct 0.0 0.0 - 0.2 mg/dL   Alkaline Phosphatase 66 34 - 104 IU/L   Protein, Total 7.5 6.4 - 8.9 g/dL   ALT 10 7 - 52 IU/L   AST 14 13 - 39 IU/L   Albumin/Globulin Ratio 1.4 1.2 - 2.3   Imaging Results   None    ECG Results          ECG 12 lead; (Final result)  Result time 05/14/24 21:10:46    Final result             Impression:   Normal sinus rhythm Possible Left atrial enlargement LVH ( R in aVL , Sokolow-Lyon , Cornell product , Romhilt-Estes ) Cannot rule out Septal infarct , age undetermined ST & T wave abnormality, consider inferior ischemia Abnormal ECG No previous ECGs available Confirmed by HATLEVOLL, KARA ([id number]) on 05/14/2024 9:10:41 PM          Narrative:   Ventricular Rate: 96 Atrial Rate: 96 P-R Interval: 188 QRS Duration: 104 Q-T Interval: 340 QTC Calculation(Bazett): 429 P Axis: 33 R Axis: 16 T Axis: -49                          Text in this note was generated using an ambient documentation service.  I discussed the potential to use a recording device to record and summarize our discussion today.  All persons present during the encounter consented to its use.       [1] Allergies Allergen Reactions  . Latex, Natural Rubber Anaphylaxis, Hives, Itching and Other (See Comments)    SKIN BURNING & PEELING   . Nsaids (Non-Steroidal Anti-Inflammatory Drug)  Other (See Comments)    Serve ulcers; stomach aches, GERD.  . Tomato Anaphylaxis, Hives and Itching  . Gabapentin  Palpitations    Has heart condition; also makes jittery  . Metformin      Other Reaction(s): Abdominal Pain  Other Reaction(s): Abdominal Pain    Causes pancreatitis to worsen  Causes pancreatitis to worsen  . Lisinopril      Other Reaction(s): Angioedema  . Semaglutide (Weight Loss)     Other Reaction(s): Other (See Comments)  Acute on chronic pancreatitis 09/2023  . Tramadol Hives and Itching  . Duloxetine Anxiety    Makes her jittery   [2] Past Medical History: Diagnosis Date  . Aortic stenosis   . DM type 2 (diabetes mellitus, type 2)   . Hypertension   [3] Past Surgical History: Procedure Laterality Date  . AORTIC VALVE REPLACEMENT     On-X valve.  Low-dose anticoagulation is appropriate for this valve  . CHOLECYSTECTOMY    . HYSTERECTOMY    . KNEE SURGERY    . MOUTH SURGERY    [4] History reviewed. No pertinent family history.  Saddie Llano, DO 05/15/24 0130

## 2024-05-16 NOTE — Discharge Summary (Signed)
 Discharge Summary  Date of Admission: 05/14/2024 Name:  Sarah Sosa  Date of Discharge:  05/16/2024 DOB:  [birth year] Discharging Team:  Hospitalist Medicine MRN:  [id number] Consultants:  None Primary Care Physician: Jerilynn Kayla Dar, MD  Discharge Diagnoses:    Hypomagnesemia   Primary hypertension   H/O aortic valve replacement   Chronic pancreatitis (CMS/HCC v28)   Hypokalemia      Discharge Medications     PAUSE taking these medications    furosemide  40 MG tablet Wait to take this until your doctor or other care provider tells you to start again. Discuss with your provider if you need to continue this medication Take 1 tablet (40 mg total) by mouth daily as needed. Commonly known as: LASIX    losartan  50 MG tablet Wait to take this until your doctor or other care provider tells you to start again. Discuss with your provider if you need to continue this medication Take 1 tablet (50 mg total) by mouth nightly. Commonly known as: COZAAR    spironolactone  25 MG tablet Wait to take this until your doctor or other care provider tells you to start again. Discuss with your provider if you need to continue this medication Take 1 tablet (25 mg total) by mouth nightly. Commonly known as: ALDACTONE        .    acetaminophen  160 MG chewable tablet Chew 2 tablets (320 mg total) 3 (three) times a day as needed. Commonly known as: TYLENOL    aluminum-magnesium  hydroxide-simethicone  200-200-20 mg/5 mL Susp suspension Take 20 mL by mouth every 4 (four) hours as needed for indigestion or heartburn. Commonly known as: MAALOX   amitriptyline  10 MG tablet Take 1 tablet (10 mg total) by mouth nightly. Commonly known as: ELAVIL    amLODIPine  10 MG tablet Take 1 tablet (10 mg total) by mouth nightly. Lastdose 10/28 AT hs Commonly known as: NORVASC    aspirin  81 MG chewable tablet Chew 1 tablet (81 mg total) daily.   atorvastatin  40 MG tablet Take 1 tablet  (40 mg total) by mouth nightly. Commonly known as: LIPITOR   buprenorphine  HCL 8 mg Subl SL tablet Place 1 tablet (8 mg total) under the tongue 3 (three) times a day. Commonly known as: SUBUTEX    Creon  36,000-114,000- 180,000 unit Cpdr Take Two capsule by mouth before each meal. Take One capsule before each snack. Generic drug: lipase-protease-amylase (pork)   cyanocobalamin  (vitamin B-12) 2,000 mcg Tab Take 2,000 mcg by mouth daily. Start taking on: May 17, 2024   famotidine  20 mg tablet Take 1 tablet (20 mg total) by mouth 2 (two) times a day. Commonly known as: PEPCID    folic acid  1 MG tablet Take 1 tablet (1 mg total) by mouth daily. Commonly known as: FOLVITE  Start taking on: May 17, 2024   guaiFENesin 100 mg/5 mL syrup Take 10 mL (200 mg total) by mouth every 4 (four) hours as needed for cough. Commonly known as: ROBITUSSIN   magnesium  oxide 400 mg (241.3 mg magnesium ) tablet Take 1 tablet (400 mg total) by mouth 2 (two) times a day for 30 days. Commonly known as: MAG-OX   melatonin 3 mg Tab tablet Take 1 tablet (3 mg total) by mouth nightly as needed for sleep.   metformin  500 MG tablet Take 1 tablet (500 mg total) by mouth daily with breakfast. Commonly known as: GLUCOPHAGE    multivitamin 400 mcg tablet Take 1 tablet by mouth daily. Commonly known as: THERAGRAN Start taking on: May 17, 2024  oxyCODONE  5 MG immediate release tablet Take 1 tablet (5 mg total) by mouth 4 (four) times a day. One to 2 tablets every four hours as needed Commonly known as: ROXICODONE    polyethylene glycol 17 gram packet Take 17 g by mouth daily as needed for constipation. Commonly known as: MIRALAX    thiamine  100 mg tablet Take 1 tablet (100 mg total) by mouth daily. Commonly known as: VITAMIN B-1   trazodone  50 MG tablet Take 2 tablets (100 mg total) by mouth nightly. Commonly known as: DESYREL    warfarin 7.5 MG tablet Take 3.25 mg by mouth nightly. Note  dose and timing Take half tablet (total dose is 3.25 mg) every night Commonly known as: COUMADIN        Other Discharge Instructions:  Disposition:  Home Follow Up: PCP, pain medication specialist, gastroenterology Activity:  As tolerated Diet: Diabetic, low-fat, low-salt, low-cholesterol  BP 150/87 (BP Location: Left upper arm, BP Position: Sitting, Cuff Size: Large Adult ([place]))   Pulse 69   Temp 98.4 F (36.9 C) (Oral)   Resp 18   Ht 1.676 m (5' 6)   Wt (!) 110.5 kg (243 lb 9.7 oz)   SpO2 100%   BMI 39.32 kg/m   Results:   Last Weight:  (!) 110.5 kg (243 lb 9.7 oz) Lab Results  Component Value Date   WBC 6.0 05/16/2024   HGB 12.7 05/16/2024   HCT 38 05/16/2024   PLT 234 05/16/2024   INR 2.27 05/16/2024   NA 137 05/16/2024   K 4.3 05/16/2024   CL 103 05/16/2024   CO2 28 05/16/2024   BUN 18 05/16/2024   CREAT 0.83 05/16/2024   GLU 128 05/16/2024   CA 9.6 05/16/2024   MG 1.5 (L) 05/16/2024   PHOS 3.6 05/15/2024   ALB 3.7 05/15/2024   TP 6.4 05/15/2024   ALT 8 05/15/2024   AST 14 05/15/2024   BILIT 0.3 05/15/2024   CPK 66 05/15/2024   TSH 0.44 (L) 05/15/2024   PA1C 6.0 (H) 05/15/2024   CHOL 179 05/15/2024   LDL 74 05/15/2024   HDL 79 05/15/2024   TRIG 130 05/15/2024   UWBC <5 05/15/2024   URBC <3 05/15/2024   UBAC TRACE 05/15/2024   ULEU TRACE (A) 05/15/2024   UNIT NEGATIVE 05/15/2024   Unresulted Labs     None      Radiology:  ECG 12 lead; Result Date: 05/14/2024 Normal sinus rhythm Possible Left atrial enlargement LVH ( R in aVL , Sokolow-Lyon , Cornell product , Romhilt-Estes ) Cannot rule out Septal infarct , age undetermined ST & T wave abnormality, consider inferior ischemia Abnormal ECG No previous ECGs available Confirmed by HATLEVOLL, KARA ([id number]) on 05/14/2024 9:10:41 PM  History of Present Illness:  Please see admission note for full details regarding the patient's presentation.  In brief, this 43 y.o. female with history of  mechanical aortic valve replacement, chronic pancreatitis, T2DM, HTN, obesity class III, and alcohol use presented with hypomagnesemia and hypokalemia.  Hospital Course:   The patient, a 43 year old female with a history of chronic pancreatitis, type 2 diabetes, hypertension, mechanical aortic valve replacement, class III obesity, and alcohol use, was admitted due to hypomagnesemia and hypokalemia. She reported a reduction in alcohol consumption. Initial solutions included supplementing electrolytes and adjusting medications. Upon admission, the patient received intravenous magnesium  sulfate for hypomagnesemia, likely secondary to PPI use, which was discontinued and changed to Pepcid . Potassium supplementation improved her hypokalemia. The patient's hypertensive medications including diuretics  were held due to relative hypotension observed during hospital monitoring. Chronic warfarin management for her aortic valve was adjusted to maintain her INR within a therapeutic range. Throughout hospitalization, her diabetes remained controlled on insulin , and hyperlipidemia management continued with atorvastatin . A suspicion of hyperthyroidism was noted with a low TSH level however free T4 within normal limits. Acute liver failure was reviewed with stable liver function tests. Her pain and pancreatitis management ensued with home medications.   Patient is clinically stable.  Denies any acute complaints.  No fevers, chills, chest pain or shortness of breath.  No leukocytosis.  Received another dose of IV magnesium  today prior to discharge.  Advised to continue magnesium  p.o. supplementation on discharge.  PPI has been changed to Pepcid  as PPI may have been causing low magnesium .  Patient with low/normal BPs in hospital likely due to blood pressure medication side effects.  Patient has advised to hold Lasix , spironolactone  and losartan  for now pending monitoring of blood pressures at home and follow-up with PCP within 3 to  5 days.  Okay to resume amlodipine  for blood pressure for now as BP improved today.  Patient will need to follow-up with her PCP, gastroenterologist and pain medication specialist and consultants as outpatient.  Discharge plan discussed with patient.  Discharge Exam: Vital signs were: heart rate 69 beats/min, blood pressure within a 24-hour range of 87-150/52-87 mmHg, temperature 36.9C, respiratory rate 18 breaths/min, and oxygen saturation 100%. The patient was alert and oriented, with normal respiratory and cardiovascular exam findings. Significant Diagnostic Studies: ECG showed normal sinus rhythm with left atrial enlargement and potential left ventricular hypertrophy.  Laboratory studies included a corrected low magnesium  level, normalized potassium levels, and stable liver function tests. No deep vein thromboembolism was noted on bilateral lower extremity ultrasound.   Follow-up Orders: The patient is advised to follow up with her primary care physician and a pain management specialist. She should adhere to her prescribed medications, including warfarin management. Her outpatient care is to include monitoring of blood pressure, electrolytes, glucose levels, and liver function. Further evaluation of her thyroid function and regular monitoring for possible complications associated with her chronic conditions are recommended.   The patient was instructed on indications for seeking medical attention.  After physical examination and a review of our findings and recommendations, the patient was cleared for discharge.  I spent more than 30 minutes in the discharge of this patient.  Deretha CHRISTELLA Manas, DO  Portions of this record may have been dictated using voice recognition software.  Unintentional errors in spelling and vocabulary are possible and may sometimes remain uncorrected.

## 2024-06-08 ENCOUNTER — Emergency Department: Payer: MEDICAID

## 2024-06-08 ENCOUNTER — Inpatient Hospital Stay
Admission: EM | Admit: 2024-06-08 | Discharge: 2024-06-12 | DRG: 689 | Disposition: A | Discharge status: Home / Self Care | Payer: MEDICAID | Attending: Internal Medicine | Admitting: Internal Medicine

## 2024-06-08 ENCOUNTER — Other Ambulatory Visit: Payer: Self-pay

## 2024-06-08 DIAGNOSIS — I110 Hypertensive heart disease with heart failure: Secondary | ICD-10-CM | POA: Diagnosis present

## 2024-06-08 DIAGNOSIS — G894 Chronic pain syndrome: Secondary | ICD-10-CM | POA: Diagnosis present

## 2024-06-08 DIAGNOSIS — K852 Alcohol induced acute pancreatitis without necrosis or infection: Secondary | ICD-10-CM | POA: Diagnosis present

## 2024-06-08 DIAGNOSIS — Z7901 Long term (current) use of anticoagulants: Secondary | ICD-10-CM

## 2024-06-08 DIAGNOSIS — B9620 Unspecified Escherichia coli [E. coli] as the cause of diseases classified elsewhere: Secondary | ICD-10-CM | POA: Diagnosis present

## 2024-06-08 DIAGNOSIS — E119 Type 2 diabetes mellitus without complications: Secondary | ICD-10-CM | POA: Diagnosis present

## 2024-06-08 DIAGNOSIS — K861 Other chronic pancreatitis: Secondary | ICD-10-CM | POA: Diagnosis present

## 2024-06-08 DIAGNOSIS — I5032 Chronic diastolic (congestive) heart failure: Secondary | ICD-10-CM | POA: Diagnosis present

## 2024-06-08 DIAGNOSIS — F17210 Nicotine dependence, cigarettes, uncomplicated: Secondary | ICD-10-CM | POA: Diagnosis present

## 2024-06-08 DIAGNOSIS — Z8249 Family history of ischemic heart disease and other diseases of the circulatory system: Secondary | ICD-10-CM

## 2024-06-08 DIAGNOSIS — Z6841 Body Mass Index (BMI) 40.0 and over, adult: Secondary | ICD-10-CM

## 2024-06-08 DIAGNOSIS — Z885 Allergy status to narcotic agent status: Secondary | ICD-10-CM

## 2024-06-08 DIAGNOSIS — J45909 Unspecified asthma, uncomplicated: Secondary | ICD-10-CM | POA: Diagnosis present

## 2024-06-08 DIAGNOSIS — R791 Abnormal coagulation profile: Secondary | ICD-10-CM | POA: Diagnosis present

## 2024-06-08 DIAGNOSIS — Z90710 Acquired absence of both cervix and uterus: Secondary | ICD-10-CM

## 2024-06-08 DIAGNOSIS — I10 Essential (primary) hypertension: Secondary | ICD-10-CM | POA: Diagnosis not present

## 2024-06-08 DIAGNOSIS — Z91040 Latex allergy status: Secondary | ICD-10-CM

## 2024-06-08 DIAGNOSIS — Z952 Presence of prosthetic heart valve: Secondary | ICD-10-CM

## 2024-06-08 DIAGNOSIS — I5030 Unspecified diastolic (congestive) heart failure: Secondary | ICD-10-CM | POA: Diagnosis present

## 2024-06-08 DIAGNOSIS — N390 Urinary tract infection, site not specified: Principal | ICD-10-CM | POA: Diagnosis present

## 2024-06-08 DIAGNOSIS — Z886 Allergy status to analgesic agent status: Secondary | ICD-10-CM

## 2024-06-08 DIAGNOSIS — K6289 Other specified diseases of anus and rectum: Secondary | ICD-10-CM | POA: Diagnosis present

## 2024-06-08 DIAGNOSIS — E785 Hyperlipidemia, unspecified: Secondary | ICD-10-CM | POA: Diagnosis present

## 2024-06-08 DIAGNOSIS — F1911 Other psychoactive substance abuse, in remission: Secondary | ICD-10-CM | POA: Diagnosis present

## 2024-06-08 DIAGNOSIS — Z888 Allergy status to other drugs, medicaments and biological substances status: Secondary | ICD-10-CM

## 2024-06-08 DIAGNOSIS — Z79899 Other long term (current) drug therapy: Secondary | ICD-10-CM

## 2024-06-08 DIAGNOSIS — E860 Dehydration: Secondary | ICD-10-CM | POA: Diagnosis present

## 2024-06-08 DIAGNOSIS — Z8261 Family history of arthritis: Secondary | ICD-10-CM

## 2024-06-08 DIAGNOSIS — F1010 Alcohol abuse, uncomplicated: Secondary | ICD-10-CM | POA: Diagnosis not present

## 2024-06-08 DIAGNOSIS — E876 Hypokalemia: Secondary | ICD-10-CM | POA: Diagnosis present

## 2024-06-08 DIAGNOSIS — F1020 Alcohol dependence, uncomplicated: Secondary | ICD-10-CM | POA: Diagnosis present

## 2024-06-08 DIAGNOSIS — Z7984 Long term (current) use of oral hypoglycemic drugs: Secondary | ICD-10-CM

## 2024-06-08 DIAGNOSIS — Z91018 Allergy to other foods: Secondary | ICD-10-CM

## 2024-06-08 DIAGNOSIS — R112 Nausea with vomiting, unspecified: Secondary | ICD-10-CM | POA: Diagnosis present

## 2024-06-08 DIAGNOSIS — K219 Gastro-esophageal reflux disease without esophagitis: Secondary | ICD-10-CM | POA: Diagnosis present

## 2024-06-08 DIAGNOSIS — E559 Vitamin D deficiency, unspecified: Secondary | ICD-10-CM | POA: Diagnosis present

## 2024-06-08 DIAGNOSIS — K649 Unspecified hemorrhoids: Secondary | ICD-10-CM | POA: Diagnosis present

## 2024-06-08 DIAGNOSIS — K859 Acute pancreatitis without necrosis or infection, unspecified: Principal | ICD-10-CM

## 2024-06-08 DIAGNOSIS — K5900 Constipation, unspecified: Secondary | ICD-10-CM | POA: Diagnosis present

## 2024-06-08 LAB — URINALYSIS, ROUTINE W REFLEX MICROSCOPIC
Bilirubin Urine: NEGATIVE
Glucose, UA: >=500 mg/dL — AB
Ketones, ur: 20 mg/dL — AB
Nitrite: NEGATIVE
Protein, ur: >=300 mg/dL — AB
Specific Gravity, Urine: 1.031 — ABNORMAL HIGH (ref 1.005–1.030)
WBC, UA: >50 WBC/hpf (ref 0–5)
pH: 5 (ref 5.0–8.0)

## 2024-06-08 LAB — COMPREHENSIVE METABOLIC PANEL WITH GFR
ALT: 18 U/L (ref 0–44)
AST: 68 U/L — ABNORMAL HIGH (ref 15–41)
Albumin: 4.1 g/dL (ref 3.5–5.0)
Alkaline Phosphatase: 110 U/L (ref 38–126)
Anion gap: 17 — ABNORMAL HIGH (ref 5–15)
BUN: 7 mg/dL (ref 6–20)
CO2: 21 mmol/L — ABNORMAL LOW (ref 22–32)
Calcium: 8.6 mg/dL — ABNORMAL LOW (ref 8.9–10.3)
Chloride: 98 mmol/L (ref 98–111)
Creatinine, Ser: 0.75 mg/dL (ref 0.44–1.00)
GFR, Estimated: >60 mL/min (ref >60)
Glucose, Bld: 114 mg/dL — ABNORMAL HIGH (ref 70–99)
Potassium: 3.4 mmol/L — ABNORMAL LOW (ref 3.5–5.1)
Sodium: 136 mmol/L (ref 135–145)
Total Bilirubin: 0.7 mg/dL (ref 0.0–1.2)
Total Protein: 7.4 g/dL (ref 6.5–8.1)

## 2024-06-08 LAB — CBC
HCT: 38.4 % (ref 36.0–46.0)
Hemoglobin: 12.8 g/dL (ref 12.0–15.0)
MCH: 31.3 pg (ref 26.0–34.0)
MCHC: 33.3 g/dL (ref 30.0–36.0)
MCV: 93.9 fL (ref 80.0–100.0)
Platelets: 251 K/uL (ref 150–400)
RBC: 4.09 MIL/uL (ref 3.87–5.11)
RDW: 13.9 % (ref 11.5–15.5)
WBC: 6.1 K/uL (ref 4.0–10.5)
nRBC: 0 % (ref 0.0–0.2)

## 2024-06-08 LAB — MAGNESIUM: Magnesium: 1 mg/dL — ABNORMAL LOW (ref 1.7–2.4)

## 2024-06-08 LAB — LIPASE, BLOOD: Lipase: 231 U/L — ABNORMAL HIGH (ref 11–51)

## 2024-06-08 LAB — PREGNANCY, URINE: Preg Test, Ur: NEGATIVE

## 2024-06-08 LAB — GLUCOSE, CAPILLARY: Glucose-Capillary: 94 mg/dL (ref 70–99)

## 2024-06-08 MED ORDER — HYDROMORPHONE HCL 1 MG/ML IJ SOLN
1.0000 mg | INTRAMUSCULAR | Status: DC | PRN
  Administered 2024-06-08 – 2024-06-09 (×6): 1 mg via INTRAVENOUS
  Filled 2024-06-08 (×6): qty 1

## 2024-06-08 MED ORDER — ONDANSETRON HCL 4 MG/2ML IJ SOLN
4.0000 mg | Freq: Once | INTRAMUSCULAR | Status: AC
  Administered 2024-06-08: 4 mg via INTRAVENOUS
  Filled 2024-06-08: qty 2

## 2024-06-08 MED ORDER — TRAZODONE HCL 100 MG PO TABS
100.0000 mg | ORAL_TABLET | Freq: Every day | ORAL | Status: DC
  Administered 2024-06-09 – 2024-06-11 (×4): 100 mg via ORAL
  Filled 2024-06-08 (×4): qty 1

## 2024-06-08 MED ORDER — SODIUM CHLORIDE 0.9 % IV SOLN
1.0000 g | Freq: Once | INTRAVENOUS | Status: AC
  Administered 2024-06-08: 1 g via INTRAVENOUS
  Filled 2024-06-08: qty 10

## 2024-06-08 MED ORDER — MAGNESIUM SULFATE 2 GM/50ML IV SOLN
2.0000 g | Freq: Once | INTRAVENOUS | Status: AC
  Administered 2024-06-08: 2 g via INTRAVENOUS
  Filled 2024-06-08: qty 50

## 2024-06-08 MED ORDER — INSULIN ASPART 100 UNIT/ML IJ SOLN
0.0000 [IU] | Freq: Three times a day (TID) | INTRAMUSCULAR | Status: DC
  Administered 2024-06-09: 3 [IU] via SUBCUTANEOUS
  Administered 2024-06-10 – 2024-06-11 (×4): 2 [IU] via SUBCUTANEOUS
  Filled 2024-06-08 (×3): qty 2
  Filled 2024-06-08: qty 3

## 2024-06-08 MED ORDER — MORPHINE SULFATE (PF) 2 MG/ML IV SOLN
2.0000 mg | Freq: Once | INTRAVENOUS | Status: AC
  Administered 2024-06-08: 2 mg via INTRAVENOUS
  Filled 2024-06-08: qty 1

## 2024-06-08 MED ORDER — SODIUM CHLORIDE 0.9 % IV BOLUS
1000.0000 mL | Freq: Once | INTRAVENOUS | Status: AC
  Administered 2024-06-08: 1000 mL via INTRAVENOUS

## 2024-06-08 MED ORDER — IOHEXOL 300 MG/ML  SOLN
100.0000 mL | Freq: Once | INTRAMUSCULAR | Status: AC | PRN
  Administered 2024-06-08: 100 mL via INTRAVENOUS

## 2024-06-08 MED ORDER — HYDROMORPHONE HCL 1 MG/ML IJ SOLN
1.0000 mg | Freq: Once | INTRAMUSCULAR | Status: AC
  Administered 2024-06-08: 1 mg via INTRAVENOUS
  Filled 2024-06-08: qty 1

## 2024-06-08 MED ORDER — LACTATED RINGERS IV SOLN: INTRAVENOUS | Status: AC

## 2024-06-08 MED ORDER — ONDANSETRON HCL 4 MG/2ML IJ SOLN
4.0000 mg | Freq: Four times a day (QID) | INTRAMUSCULAR | Status: DC | PRN
  Administered 2024-06-08 – 2024-06-10 (×2): 4 mg via INTRAVENOUS
  Filled 2024-06-08 (×2): qty 2

## 2024-06-08 MED ORDER — ONDANSETRON HCL 4 MG PO TABS: 4.0000 mg | ORAL_TABLET | Freq: Four times a day (QID) | ORAL | Status: DC | PRN

## 2024-06-08 MED ORDER — INSULIN ASPART 100 UNIT/ML IJ SOLN
0.0000 [IU] | Freq: Every day | INTRAMUSCULAR | Status: DC
  Administered 2024-06-10: 2 [IU] via SUBCUTANEOUS
  Filled 2024-06-08: qty 2

## 2024-06-08 NOTE — Discharge Instructions (Addendum)
Keep log of your sugars at home °

## 2024-06-08 NOTE — ED Provider Notes (Signed)
-----------------------------------------   5:53 PM on 06/08/2024 -----------------------------------------  I have personally seen and evaluated the patient.  Patient is complaining of abdominal pain nausea vomiting.  Patient's lab work shows anion gap of 17 consistent with dehydration.  No significant LFT elevation however the patient's lipase is elevated 231.  Patient has chronic alcoholic otherwise not drink in the last couple days.  Suspect likely alcohol-induced pancreatitis that is led to her nausea and vomiting unable to tolerate fluids at home.  CBC is normal with a normal white blood cell count magnesium  is low at 1.0 will replete with IV magnesium .  Patient's urinalysis also appears to show urinary tract infection with many bacteria and greater than 50 white cells.  Will treat with IV Rocephin .  Pregnancy test is negative.  CT scan of the abdomen does not show any significant findings besides acute pancreatitis.  Given the patient's inability to tolerate oral medications or fluids will admit to the hospital service for further workup and treatment.   Dorothyann Drivers, MD 06/08/24 1754

## 2024-06-08 NOTE — ED Notes (Signed)
 Phlebotomist at bedside.

## 2024-06-08 NOTE — ED Triage Notes (Signed)
 Pt comes with UTI pt states this started yesterday. Pt states pain and cloudy urine. Pt states nausea and vomiting. Pt states hx of pancreatitis.

## 2024-06-08 NOTE — ED Provider Notes (Addendum)
 [facility] Provider Note    Event Date/Time   First MD Initiated Contact with Patient 06/08/24 1354     (approximate)   History   Abdominal Pain and UTI   HPI  Sarah Sosa is a 43 y.o. female  with a past medical history of mechanical aortic valve replacement, type 2 diabetes, chronic pancreatitis, alcohol abuse, PID, asthma, chronic pain disorder, on warfarin presents to the emergency department with nausea, vomiting, epigastric pain, dysuria, and increased urinary frequency that started yesterday. Denies chest pain, SOB, abnormal vaginal discharge, vaginal bleeding, flank or back pain, fever. Reports she has not used alcohol today or in the last week. Patient is on warfarin.  Recent hospitalization with discharge on 10/31 for acute on chronic pancreatitis, hypokalemia, hypomagnesemia, primary HTN.  Physical Exam   Triage Vital Signs: ED Triage Vitals  Encounter Vitals Group     BP 06/08/24 1340 (!) 192/112     Girls Systolic BP Percentile --      Girls Diastolic BP Percentile --      Boys Systolic BP Percentile --      Boys Diastolic BP Percentile --      Pulse Rate 06/08/24 1340 81     Resp 06/08/24 1340 18     Temp 06/08/24 1340 98 F (36.7 C)     Temp src --      SpO2 06/08/24 1340 100 %     Weight 06/08/24 1339 244 lb (110.7 kg)     Height 06/08/24 1339 5' 6 (1.676 m)     Head Circumference --      Peak Flow --      Pain Score 06/08/24 1339 10     Pain Loc --      Pain Education --      Exclude from Growth Chart --     Most recent vital signs: Vitals:   06/08/24 1654 06/08/24 1756  BP: (!) 163/74   Pulse: (!) 57   Resp: 16   Temp:  97.7 F (36.5 C)  SpO2: 100%     General: Awake, appears to be in a lot of pain. Neck: Supple. CV: Good peripheral perfusion. Regular rate, 81 bpm. Respiratory:Normal respiratory effort.  No respiratory distress. CTAB. GI: Soft, non-distended. TTP in epigastric region.  Skin:Warm, dry, intact.  No rashes, lesions, or ecchymosis. No cyanosis or pallor. Neurological: A&Ox4 to person, place, time, and situation.  No CVA tenderness b/l.   ED Results / Procedures / Treatments   Labs (all labs ordered are listed, but only abnormal results are displayed) Labs Reviewed  LIPASE, BLOOD - Abnormal; Notable for the following components:      Result Value   Lipase 231 (*)    All other components within normal limits  COMPREHENSIVE METABOLIC PANEL WITH GFR - Abnormal; Notable for the following components:   Potassium 3.4 (*)    CO2 21 (*)    Glucose, Bld 114 (*)    Calcium  8.6 (*)    AST 68 (*)    Anion gap 17 (*)    All other components within normal limits  URINALYSIS, ROUTINE W REFLEX MICROSCOPIC - Abnormal; Notable for the following components:   Color, Urine YELLOW (*)    APPearance CLOUDY (*)    Specific Gravity, Urine 1.031 (*)    Glucose, UA >=500 (*)    Hgb urine dipstick SMALL (*)    Ketones, ur 20 (*)    Protein, ur >=300 (*)  Leukocytes,Ua SMALL (*)    Bacteria, UA MANY (*)    Non Squamous Epithelial PRESENT (*)    All other components within normal limits  MAGNESIUM  - Abnormal; Notable for the following components:   Magnesium  1.0 (*)    All other components within normal limits  CBC  PREGNANCY, URINE  POC URINE PREG, ED     EKG     RADIOLOGY  CT abdomen pelvis IMPRESSION: 1. Findings compatible with acute pancreatitis . 2. Trace free fluid in the cul-de-sac.   PROCEDURES:  Critical Care performed: No   Procedures   MEDICATIONS ORDERED IN ED: Medications  cefTRIAXone  (ROCEPHIN ) 1 g in sodium chloride  0.9 % 100 mL IVPB (1 g Intravenous New Bag/Given 06/08/24 1758)  magnesium  sulfate IVPB 2 g 50 mL (has no administration in time range)  ondansetron  (ZOFRAN ) injection 4 mg (4 mg Intravenous Given 06/08/24 1645)  sodium chloride  0.9 % bolus 1,000 mL (1,000 mLs Intravenous New Bag/Given 06/08/24 1649)  HYDROmorphone  (DILAUDID ) injection 1 mg  (1 mg Intravenous Given 06/08/24 1647)  iohexol  (OMNIPAQUE ) 300 MG/ML solution 100 mL (100 mLs Intravenous Contrast Given 06/08/24 1717)  morphine  (PF) 2 MG/ML injection 2 mg (2 mg Intravenous Given 06/08/24 1754)     IMPRESSION / MDM / ASSESSMENT AND PLAN / ED COURSE  I reviewed the triage vital signs and the nursing notes.                              Differential diagnosis includes, but is not limited to, acute on chronic pancreatitis, nephrolithiasis, bowel obstruction, alcohol use, hypomagnesemia  Patient's presentation is most consistent with acute presentation with potential threat to life or bodily function.  Clinical Course as of 06/08/24 MAURINE Repress Jun 08, 2024  1422 Late entry. Patient here with epigastric pain and dysuria that started yesterday.  Patient has history of chronic pancreatitis, type 2 DM with recent hospital admission for hypokalemia and hypomagnesemia d/c'd on 10/31. Patient states this pain feels like her acute pancreatitis episodes. CBC unremarkable w/ normal WBC count 6.1. Magnesium  low at 1.0, Lipase elevated at 231, AST 68, anion gap 17. UPT neg. UA glucosuria >/= 500, small Hgb, protein >/=300, ketones 20, small leukocytes, non squamous epith cells present. CT Abdomen Pelvis pending. Patient started on fluids, zofran , 1 dose dilaudid  [SD]  1741 Patient updated on plan. She is shaking from her pain. Will treat UTI w/ Rocephin  IV. Repleting Magnesium  w/ 2g IV. Given 1 dose of morphine . Awaiting CT abdomen pelvis results. [SD]  1754 CT abdomen pelvis IMPRESSION: 1. Findings compatible with acute pancreatitis . 2. Trace free fluid in the cul-de-sac.  [SD]  1754 Will consult hospitalist team for admission for acute pancreatitis. [SD]  1821 Hospitalist team consulted Dr. Sim-- will admit patient and they will take over her care from this point. [SD]    Clinical Course User Index [SD] Sheron Salm, PA-C     FINAL CLINICAL IMPRESSION(S) / ED DIAGNOSES    Final diagnoses:  Acute pancreatitis, unspecified complication status, unspecified pancreatitis type     Rx / DC Orders   ED Discharge Orders     None        Note:  This document was prepared using Dragon voice recognition software and may include unintentional dictation errors.     Sheron Salm, PA-C 06/08/24 1824    [address], [place], PA-C 06/08/24 MAURINE Dorothyann Drivers, MD 06/08/24 AMOS

## 2024-06-08 NOTE — Progress Notes (Signed)
 Pt requesrs bed to be elevated. States it hurts her knees when she has to get up. Encouraged to put bed down due to safety. She refuses. Verified by Emmie Sou RN < charge nurse.

## 2024-06-08 NOTE — H&P (Addendum)
 History and Physical    Patient: Sarah Sosa FMW:[id number] DOB: [birth year] DOA: 06/08/2024 DOS: the patient was seen and examined on 06/08/2024 PCP: Clinic, [facility]  Patient coming from: Home  Chief Complaint:  Chief Complaint  Patient presents with   Abdominal Pain   UTI   HPI: [name] is a 43 y.o. female with medical history significant of alcohol abuse, history of pancreatitis, GERD, asthma, chronic pain syndrome, essential hypertension, history of pelvic inflammatory disease, recurrent UTI who presented to the ER with suspicion of UTI.  Patient says she has been having cloudy urine and dysuria.  Also some nausea and vomiting.  Has had previous history of pancreatitis.  In the ER patient was seen and evaluated.  Urinalysis suggested possible UTI.  Further studies showed acute pancreatitis.  Lipase is more than 200.  Patient being admitted for management of both pancreatitis and UTI.  Review of Systems: As mentioned in the history of present illness. All other systems reviewed and are negative. Past Medical History:  Diagnosis Date   Arthritis    Asthma    Chronic pain disorder 03/19/2018   GERD (gastroesophageal reflux disease)    Headache    Heart murmur    History of trichomoniasis 03/19/2018   Hypertension    Intractable nausea and vomiting    Pancreatitis    PID (pelvic inflammatory disease) 02/14/2018   Uterine leiomyoma 03/19/2018   Past Surgical History:  Procedure Laterality Date   ABDOMINAL HYSTERECTOMY     AORTIC VALVE REPLACEMENT  03/2022   CHOLECYSTECTOMY     DENTAL SURGERY     HYSTERECTOMY ABDOMINAL WITH SALPINGECTOMY Bilateral 04/15/2018   Procedure: HYSTERECTOMY ABDOMINAL WITH BILATERAL SALPINGECTOMY;  Surgeon: Connell Davies, MD;  Location: [facility] ORS;  Service: Gynecology;  Laterality: Bilateral;   kenn surgery Bilateral    KNEE ARTHROSCOPY Left 2012, 2013   Social History:  reports that she has been smoking cigarettes. She has a 11  pack-year smoking history. She has never used smokeless tobacco. She reports that she does not currently use alcohol. She reports current drug use. Drug: Marijuana.  Allergies  Allergen Reactions   Latex Anaphylaxis, Hives and Itching    Other reaction(s): Other (See Comments) SKIN BURNING & PEELING    Lisinopril  Swelling    Other Reaction(s): Angioedema   Nsaids Other (See Comments)    GI Ulcers   Tomato Anaphylaxis, Hives and Itching   Gabapentin  Palpitations   Metformin      Other Reaction(s): Abdominal Pain  Causes pancreatitis to worsen   Semaglutide     Other Reaction(s): Other (See Comments)    Acute on chronic pancreatitis 09/2023   Duloxetine Anxiety    Makes her jittery   Tramadol Hives and Itching    Family History  Problem Relation Age of Onset   Fibroids Mother    Hypertension Mother    Arthritis Mother     Prior to Admission medications   Medication Sig Start Date End Date Taking? Authorizing Provider  albuterol  (VENTOLIN  HFA) 108 (90 Base) MCG/ACT inhaler Inhale 1-2 puffs into the lungs every 6 (six) hours as needed. 07/26/23   [provider]  amitriptyline  (ELAVIL ) 10 MG tablet Take 10 mg by mouth 3 (three) times daily as needed for sleep (or pain). 11/02/22   [provider]  amLODipine  (NORVASC ) 10 MG tablet Take 1 tablet (10 mg total) by mouth daily. 07/19/23   Darci Pore, MD  atorvastatin  (LIPITOR) 40 MG tablet Take 40 mg by mouth daily.  10/17/22   [provider]  buprenorphine  (SUBUTEX ) 8 MG SUBL SL tablet Place 8 mg under the tongue. Place 1 tablet (8 mg total) under the tongue 3 (three) times daily 01/11/24   [provider]  enoxaparin  (LOVENOX ) 120 MG/0.8ML injection Inject 0.74 mLs (110 mg total) into the skin every 12 (twelve) hours for 4 days. 05/02/24 05/06/24  Jerelene Critchley, MD  furosemide  (LASIX ) 40 MG tablet Take 0.5 tablets (20 mg total) by mouth daily as needed. Home med. Patient taking differently:  Take 40 mg by mouth daily as needed for edema. Home med. 10/26/23   Awanda City, MD  lipase/protease/amylase (CREON ) 36000 UNITS CPEP capsule Take 1 capsule (36,000 Units total) by mouth 3 (three) times daily with meals. Patient not taking: Reported on 04/28/2024 08/13/23   Jhonny Calvin NOVAK, MD  losartan  (COZAAR ) 50 MG tablet Take 1 tablet (50 mg total) by mouth daily. 07/19/23   Darci Pore, MD  magnesium  chloride (SLOW-MAG) 64 MG TBEC SR tablet Take 1 tablet (64 mg total) by mouth daily. Patient not taking: Reported on 01/25/2024 12/14/23   Alexander, Natalie, DO  metFORMIN  (GLUCOPHAGE ) 500 MG tablet Take 500 mg by mouth every morning. 10/17/22   [provider]  naloxone  (NARCAN ) nasal spray 4 mg/0.1 mL Place 1 spray into the nose once as needed (to reverse overdose). 02/24/23   [provider]  omeprazole (PRILOSEC) 40 MG capsule Take 40 mg by mouth daily. 02/23/23   [provider]  ondansetron  (ZOFRAN -ODT) 4 MG disintegrating tablet Take 4 mg by mouth every 8 (eight) hours as needed for nausea. 02/08/24   [provider]  potassium chloride  20 MEQ/15ML (10%) SOLN Take 20 mEq by mouth 2 (two) times daily. 03/14/24 06/17/24  [provider]  spironolactone  (ALDACTONE ) 25 MG tablet Take 25 mg by mouth daily. 02/03/22   [provider]  traZODone  (DESYREL ) 50 MG tablet Take 100 mg by mouth at bedtime. 09/07/21   [provider]  warfarin (COUMADIN ) 7.5 MG tablet Take 0.5 tablets (3.75 mg total) by mouth daily. 05/03/24   Jerelene Critchley, MD  warfarin (COUMADIN ) 7.5 MG tablet Take 1 tablet (7.5 mg total) by mouth once for 1 dose. 05/02/24 05/02/24  Jerelene Critchley, MD    Physical Exam: Vitals:   06/08/24 1339 06/08/24 1340 06/08/24 1654 06/08/24 1756  BP:  (!) 192/112 (!) 163/74   Pulse:  81 (!) 57   Resp:  18 16   Temp:  98 F (36.7 C)  97.7 F (36.5 C)  TempSrc:    Axillary  SpO2:  100% 100%   Weight: 110.7 kg     Height: 5'  6 (1.676 m)      Constitutional: Acutely ill looking, NAD, calm, comfortable Eyes: PERRL, lids and conjunctivae normal ENMT: Mucous membranes are dry.  Posterior pharynx clear of any exudate or lesions.Normal dentition.  Neck: normal, supple, no masses, no thyromegaly Respiratory: clear to auscultation bilaterally, no wheezing, no crackles. Normal respiratory effort. No accessory muscle use.  Cardiovascular: Regular rate and rhythm, no murmurs / rubs / gallops. No extremity edema. 2+ pedal pulses. No carotid bruits.  Abdomen: Soft, full abdomen with tenderness., no masses palpated. No hepatosplenomegaly. Bowel sounds positive.  Musculoskeletal: Good range of motion, no joint swelling or tenderness, Skin: no rashes, lesions, ulcers. No induration Neurologic: CN 2-12 grossly intact. Sensation intact, DTR normal. Strength 5/5 in all 4.  Psychiatric: Normal judgment and insight. Alert and oriented x 3. Normal mood  Data Reviewed:  Temperature 98 blood pressure 150/68, pulse 57.  White count of 6.1, potassium 3.4 CO2 21 calcium  8.6 glucose 140 magnesium  1.0.  AST 68, urinalysis showed cloudy urine many bacteria WBC more than 50.  CT abdomen pelvis showed acute pancreatitis.  Lipase is 230  Assessment and Plan:  #1 acute alcoholic pancreatitis: Patient will be admitted.  Initiate bowel rest.  Pain control.  Nausea vomiting control.  Aggressive hydration.  Continue other supportive care.  #2 UTI: Initiate IV Rocephin .  Continue antibiotic. Continue to monitor  #3 hypomagnesemia: Continue to replete  #4 hypokalemia: Continue to replete  #5 chronic alcoholism: Counseling provided.  Will watch patient closely for withdrawal.  Per patient she has not drank alcohol in some days.  #6 essential hypertension: Continue home blood pressure medications.  #7 GERD: Continue PPIs  #8 history of aortic valve replacement: On warfarin.  Patient is subtherapeutic INR.  Continue to monitor  #9 heart  failure with preserved ejection fraction: Stable at baseline  #10 hyperlipidemia: Continue statin  #11 type 2 diabetes: Sliding scale insulin .    Advance Care Planning:   Code Status: Full Code   Consults: None  Family Communication: No family at bedside  Severity of Illness: The appropriate patient status for this patient is INPATIENT. Inpatient status is judged to be reasonable and necessary in order to provide the required intensity of service to ensure the patient's safety. The patient's presenting symptoms, physical exam findings, and initial radiographic and laboratory data in the context of their chronic comorbidities is felt to place them at high risk for further clinical deterioration. Furthermore, it is not anticipated that the patient will be medically stable for discharge from the hospital within 2 midnights of admission.   * I certify that at the point of admission it is my clinical judgment that the patient will require inpatient hospital care spanning beyond 2 midnights from the point of admission due to high intensity of service, high risk for further deterioration and high frequency of surveillance required.*  AuthorBETHA SIM KNOLL, MD 06/08/2024 8:02 PM  For on call review [email or web address].

## 2024-06-09 DIAGNOSIS — K852 Alcohol induced acute pancreatitis without necrosis or infection: Secondary | ICD-10-CM | POA: Diagnosis not present

## 2024-06-09 LAB — MAGNESIUM: Magnesium: 1.6 mg/dL — ABNORMAL LOW (ref 1.7–2.4)

## 2024-06-09 LAB — CBC
HCT: 36.5 % (ref 36.0–46.0)
Hemoglobin: 12.2 g/dL (ref 12.0–15.0)
MCH: 32.3 pg (ref 26.0–34.0)
MCHC: 33.4 g/dL (ref 30.0–36.0)
MCV: 96.6 fL (ref 80.0–100.0)
Platelets: 196 K/uL (ref 150–400)
RBC: 3.78 MIL/uL — ABNORMAL LOW (ref 3.87–5.11)
RDW: 14.2 % (ref 11.5–15.5)
WBC: 6.2 K/uL (ref 4.0–10.5)
nRBC: 0 % (ref 0.0–0.2)

## 2024-06-09 LAB — HEPATIC FUNCTION PANEL
ALT: 79 U/L — ABNORMAL HIGH (ref 0–44)
AST: 350 U/L — ABNORMAL HIGH (ref 15–41)
Albumin: 3.7 g/dL (ref 3.5–5.0)
Alkaline Phosphatase: 98 U/L (ref 38–126)
Bilirubin, Direct: 0.2 mg/dL (ref 0.0–0.2)
Indirect Bilirubin: 0.5 mg/dL (ref 0.3–0.9)
Total Bilirubin: 0.7 mg/dL (ref 0.0–1.2)
Total Protein: 6.5 g/dL (ref 6.5–8.1)

## 2024-06-09 LAB — PROTIME-INR
INR: 2.2 — ABNORMAL HIGH (ref 0.8–1.2)
Prothrombin Time: 25.3 s — ABNORMAL HIGH (ref 11.4–15.2)

## 2024-06-09 LAB — URINE DRUG SCREEN
Amphetamines: NEGATIVE
Barbiturates: NEGATIVE
Benzodiazepines: NEGATIVE
Cocaine: NEGATIVE
Fentanyl: NEGATIVE
Methadone Scn, Ur: NEGATIVE
Opiates: NEGATIVE
Tetrahydrocannabinol: POSITIVE — AB

## 2024-06-09 LAB — BASIC METABOLIC PANEL WITH GFR
Anion gap: 15 (ref 5–15)
BUN: 7 mg/dL (ref 6–20)
CO2: 21 mmol/L — ABNORMAL LOW (ref 22–32)
Calcium: 8.6 mg/dL — ABNORMAL LOW (ref 8.9–10.3)
Chloride: 101 mmol/L (ref 98–111)
Creatinine, Ser: 0.67 mg/dL (ref 0.44–1.00)
GFR, Estimated: >60 mL/min (ref >60)
Glucose, Bld: 66 mg/dL — ABNORMAL LOW (ref 70–99)
Potassium: 3.8 mmol/L (ref 3.5–5.1)
Sodium: 136 mmol/L (ref 135–145)

## 2024-06-09 LAB — GLUCOSE, CAPILLARY
Glucose-Capillary: 81 mg/dL (ref 70–99)
Glucose-Capillary: 147 mg/dL — ABNORMAL HIGH (ref 70–99)
Glucose-Capillary: 104 mg/dL — ABNORMAL HIGH (ref 70–99)
Glucose-Capillary: 137 mg/dL — ABNORMAL HIGH (ref 70–99)

## 2024-06-09 LAB — PHOSPHORUS: Phosphorus: 2.3 mg/dL — ABNORMAL LOW (ref 2.5–4.6)

## 2024-06-09 LAB — LIPASE, BLOOD: Lipase: 115 U/L — ABNORMAL HIGH (ref 11–51)

## 2024-06-09 LAB — VITAMIN D 25 HYDROXY (VIT D DEFICIENCY, FRACTURES): Vit D, 25-Hydroxy: 6.01 ng/mL — ABNORMAL LOW (ref 30–100)

## 2024-06-09 MED ORDER — PANTOPRAZOLE SODIUM 40 MG PO TBEC
40.0000 mg | DELAYED_RELEASE_TABLET | Freq: Two times a day (BID) | ORAL | Status: DC
  Administered 2024-06-09 – 2024-06-12 (×6): 40 mg via ORAL
  Filled 2024-06-09 (×6): qty 1

## 2024-06-09 MED ORDER — OXYCODONE HCL 5 MG PO TABS
5.0000 mg | ORAL_TABLET | Freq: Four times a day (QID) | ORAL | Status: DC | PRN
  Administered 2024-06-09: 10 mg via ORAL
  Filled 2024-06-09: qty 2

## 2024-06-09 MED ORDER — THIAMINE MONONITRATE 100 MG PO TABS
100.0000 mg | ORAL_TABLET | Freq: Every day | ORAL | Status: DC
  Administered 2024-06-09 – 2024-06-12 (×4): 100 mg via ORAL
  Filled 2024-06-09 (×4): qty 1

## 2024-06-09 MED ORDER — HYDROMORPHONE HCL 2 MG PO TABS
2.0000 mg | ORAL_TABLET | ORAL | Status: DC | PRN
  Administered 2024-06-09 – 2024-06-12 (×14): 2 mg via ORAL
  Filled 2024-06-09 (×15): qty 1

## 2024-06-09 MED ORDER — IPRATROPIUM-ALBUTEROL 0.5-2.5 (3) MG/3ML IN SOLN
3.0000 mL | Freq: Four times a day (QID) | RESPIRATORY_TRACT | Status: DC | PRN
Start: 2024-06-09 — End: 2024-06-12

## 2024-06-09 MED ORDER — MAGNESIUM SULFATE 2 GM/50ML IV SOLN
2.0000 g | Freq: Once | INTRAVENOUS | Status: AC
  Administered 2024-06-09: 2 g via INTRAVENOUS
  Filled 2024-06-09: qty 50

## 2024-06-09 MED ORDER — CYANOCOBALAMIN 1000 MCG/ML IJ SOLN
1000.0000 ug | Freq: Every day | INTRAMUSCULAR | Status: DC
  Administered 2024-06-09 – 2024-06-10 (×2): 1000 ug via INTRAMUSCULAR
  Filled 2024-06-09 (×3): qty 1

## 2024-06-09 MED ORDER — ADULT MULTIVITAMIN W/MINERALS CH
1.0000 | ORAL_TABLET | Freq: Every day | ORAL | Status: DC
  Administered 2024-06-12: 1 via ORAL
  Filled 2024-06-09 (×3): qty 1

## 2024-06-09 MED ORDER — THIAMINE HCL 100 MG/ML IJ SOLN: 100.0000 mg | Freq: Every day | INTRAMUSCULAR | Status: DC

## 2024-06-09 MED ORDER — K PHOS MONO-SOD PHOS DI & MONO 155-852-130 MG PO TABS
500.0000 mg | ORAL_TABLET | Freq: Once | ORAL | Status: DC
Start: 2024-06-09 — End: 2024-06-10
  Filled 2024-06-09: qty 2

## 2024-06-09 MED ORDER — ACETAMINOPHEN 325 MG PO TABS
650.0000 mg | ORAL_TABLET | Freq: Three times a day (TID) | ORAL | Status: DC
  Administered 2024-06-09 – 2024-06-12 (×10): 650 mg via ORAL
  Filled 2024-06-09 (×10): qty 2

## 2024-06-09 MED ORDER — FOLIC ACID 1 MG PO TABS
1.0000 mg | ORAL_TABLET | Freq: Every day | ORAL | Status: DC
  Administered 2024-06-09 – 2024-06-12 (×4): 1 mg via ORAL
  Filled 2024-06-09 (×4): qty 1

## 2024-06-09 MED ORDER — SODIUM CHLORIDE 0.9 % IV SOLN
1.0000 g | INTRAVENOUS | Status: DC
  Administered 2024-06-09 – 2024-06-10 (×2): 1 g via INTRAVENOUS
  Filled 2024-06-09 (×3): qty 10

## 2024-06-09 MED ORDER — VITAMIN D (ERGOCALCIFEROL) 1.25 MG (50000 UNIT) PO CAPS
50000.0000 [IU] | ORAL_CAPSULE | ORAL | Status: DC
  Administered 2024-06-09: 50000 [IU] via ORAL
  Filled 2024-06-09 (×2): qty 1

## 2024-06-09 MED ORDER — HYDROMORPHONE HCL 1 MG/ML IJ SOLN: 1.0000 mg | Freq: Four times a day (QID) | INTRAMUSCULAR | Status: DC | PRN

## 2024-06-09 MED ORDER — VITAMIN B-12 1000 MCG PO TABS: 1000.0000 ug | ORAL_TABLET | Freq: Every day | ORAL | Status: DC

## 2024-06-09 MED ORDER — WARFARIN - PHARMACIST DOSING INPATIENT: Freq: Every day | Status: DC

## 2024-06-09 MED ORDER — HYDRALAZINE HCL 50 MG PO TABS: 50.0000 mg | ORAL_TABLET | Freq: Four times a day (QID) | ORAL | Status: DC | PRN

## 2024-06-09 MED ORDER — HYDROMORPHONE HCL 1 MG/ML IJ SOLN
1.0000 mg | INTRAMUSCULAR | Status: DC | PRN
  Administered 2024-06-09 – 2024-06-11 (×10): 1 mg via INTRAVENOUS
  Filled 2024-06-09 (×10): qty 1

## 2024-06-09 MED ORDER — LORAZEPAM 1 MG PO TABS: 1.0000 mg | ORAL_TABLET | ORAL | Status: DC | PRN

## 2024-06-09 MED ORDER — HYDRALAZINE HCL 20 MG/ML IJ SOLN
10.0000 mg | Freq: Four times a day (QID) | INTRAMUSCULAR | Status: DC | PRN
  Administered 2024-06-12: 10 mg via INTRAVENOUS
  Filled 2024-06-09: qty 1

## 2024-06-09 MED ORDER — LORAZEPAM 2 MG/ML IJ SOLN: 1.0000 mg | INTRAMUSCULAR | Status: DC | PRN

## 2024-06-09 MED ORDER — WARFARIN SODIUM 3 MG PO TABS
3.0000 mg | ORAL_TABLET | Freq: Once | ORAL | Status: AC
  Administered 2024-06-09: 3 mg via ORAL
  Filled 2024-06-09: qty 1

## 2024-06-09 NOTE — Progress Notes (Signed)
 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  Allergies  Allergen Reactions   Latex Anaphylaxis, Hives and Itching    Other reaction(s): Other (See Comments) SKIN BURNING & PEELING    Lisinopril  Swelling    Other Reaction(s): Angioedema   Nsaids Other (See Comments)    GI Ulcers   Tomato Anaphylaxis, Hives and Itching   Gabapentin  Palpitations   Metformin      Other Reaction(s): Abdominal Pain  Causes pancreatitis to worsen   Semaglutide     Other Reaction(s): Other (See Comments)    Acute on chronic pancreatitis 09/2023   Duloxetine Anxiety    Makes her jittery   Tramadol Hives and Itching    Patient Measurements: Height: 5' 6 (167.6 cm) Weight: 112.9 kg (248 lb 12.8 oz) IBW/kg (Calculated) : 59.3 HEPARIN  DW (KG): 85.7  Vital Signs: Temp: 98.7 F (37.1 C) (11/24 0812) Temp Source: Oral (11/24 0812) BP: 149/63 (11/24 0812) Pulse Rate: 62 (11/24 0812)  Labs: Recent Labs    06/08/24 1411  HGB 12.8  HCT 38.4  PLT 251  CREATININE 0.75    Estimated Creatinine Clearance: 116.7 mL/min (by C-G formula based on SCr of 0.75 mg/dL).   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  Pt was on warfarin 3.75 mg PO daily PTA,  last dose unknown.   Because of On-X valve , her goal INR is 1.5 - 2.0 (plus daily aspirin ).    Goal of Therapy: INR :   1.5 - 2.0 -- On-X type mechanical aortic valve Monitor platelets by anticoagulation protocol: Yes   Plan:  --INR slightly supratherapeutic: will order 3 mg warfarin x 1 for today --INR daily to guide therapy --CBC at least once weekly  Sarah Sosa 06/09/2024,8:22 AM

## 2024-06-09 NOTE — Progress Notes (Signed)
 PHARMACY CONSULT NOTE - FOLLOW UP  Pharmacy Consult for Electrolyte Monitoring and Replacement   Recent Labs: Potassium (mmol/L)  Date Value  06/09/2024 3.8   Magnesium  (mg/dL)  Date Value  88/75/7974 1.6 (L)   Calcium  (mg/dL)  Date Value  88/75/7974 8.6 (L)   Albumin (g/dL)  Date Value  88/75/7974 3.7   Phosphorus (mg/dL)  Date Value  88/75/7974 2.3 (L)   Sodium (mmol/L)  Date Value  06/09/2024 136     Assessment: 43 y.o. female with medical history significant of alcohol abuse, history of pancreatitis, GERD, asthma, chronic pain syndrome, essential hypertension, history of pelvic inflammatory disease, recurrent UTI who presented to the ER with suspicion of UTI. Pharmacy is asked to follow and replace electrolytes  Goal of Therapy:  Electrolytes WNL  Plan:  ---2 grams IV magnesium  sulfate x 1 ---500 mg po k-phos neutral x 1 (contains phosphorus 16 mMol, potassium 2.2 mEq) ---recheck electrolytes in am  Sarah Sosa ,PharmD Clinical Pharmacist 06/09/2024 10:04 AM

## 2024-06-09 NOTE — Progress Notes (Signed)
 Spoke with lab who stated they could add on the new order for urine culture and drug screen to the urine already collected in ED.

## 2024-06-09 NOTE — Progress Notes (Signed)
 [facility] Progress Note  Patient: Sarah Sosa    FMW:[id number]  DOA: 06/08/2024     Date of Service: the patient was seen and examined on 06/09/2024  Chief Complaint  Patient presents with   Abdominal Pain   UTI   Brief hospital course:  Sarah Sosa is a 43 y.o. female with PMH of severe aortic stenosis s/p mechanical AVR 04/10/2022, Ascending Aortic Aneurysm s/p repair 03/2022, chronic diastolic heart failure, hypertension, hyperlipidemia, alcohol induced chronic pancreatitis, Alcohol abuse, GERD, Asthma, chronic pain syndrome, history of pelvic inflammatory disease, recurrent UTI, as reviewed from EMR, presented to [facility] ED with complaining of abdominal pain and UTI symptoms.    ED workup: Lipase elevated UA positive for UTI CT a/p: 1. Findings compatible with acute pancreatitis . 2. Trace free fluid in the cul-de-sac.  TRH was consulted for admission and further management as below.   Assessment and Plan:  # Acute pancreatitis secondary to EtOH abuse Continue IV fluid for hydration As needed pain meds Clear liquid diet PPI for GI prophy Trend Lipase   # EtOH abuse, abstinence counseling done. Continue to monitor on CIWA protocol while withdrawal symptoms Continue thiamine , multivitamin and folic acid   # UTI UA positive Continue ceftriaxone  IV Follow urine culture  # Severe aortic stenosis s/p mechanical AVR 04/10/2022, Ascending Aortic Aneurysm s/p repair 03/2022,  Continue Coumadin  and monitor INR.  Pharmacy consulted    # Chronic diastolic heart failure, HTN & HLD - It seems patient is not taking any of her home medications as per med rec done by pharmacy We will continue to hold medications for now Monitor BP and titrate medications accordingly Use hydralazine  as needed  # Vitamin B12 level 257, goal >400: Started vitamin B12 1000 mcg IM injection daily during hospital stay, followed by oral supplement.  Follow-up PCP to repeat vitamin B12 level  after 3 to 6 months.  # Vitamin D  deficiency: started vitamin D  50,000 units p.o. weekly, follow with PCP to repeat vitamin D  level after 3 to 6 months.  # Hypomagnesemia, mag repleted. # Hypophosphatemia, Phos repleted. Monitor electrolytes and replete as needed.  # Prediabetic Patient was on metformin  at home which has been held for now Continue NovoLog  sliding scale Monitor CBG   Body mass index is 40.16 kg/m.  Interventions:  Diet: CLD DVT Prophylaxis: Coumadin   Advance goals of care discussion: Full code  Family Communication: family was not present at bedside, at the time of interview.  The pt provided permission to discuss medical plan with the family. Opportunity was given to ask question and all questions were answered satisfactorily.   Disposition:  Pt is from Home, admitted with acute pancreatitis and UTI, still has intractable abdominal pain, which precludes a safe discharge. Discharge to home, when stable, may need few days to improve.  Subjective: No significant events overnight.  Patient is still complaining of significant abdominal pain requiring IV pain medications.  Denied any other complaints.  Physical Exam: General: NAD, lying comfortably Appear in no distress, affect appropriate Eyes: PERRLA ENT: Oral Mucosa Clear, moist  Neck: no JVD,  Cardiovascular: S1 and S2 Present, no Murmur,  Respiratory: good respiratory effort, Bilateral Air entry equal and Decreased, no Crackles, no wheezes Abdomen: BS present, Soft, obese and upper Abd tenderness,  Skin: no rashes Extremities: no Pedal edema, no calf tenderness Neurologic: without any new focal findings Gait not checked due to patient safety concerns  Vitals:   06/08/24 2127 06/08/24 2321 06/09/24 0522 06/09/24 9187  BP:  (!) 161/79 (!) 148/77 (!) 149/63  Pulse:  62 60 62  Resp:  16 16 16   Temp:  97.9 F (36.6 C) 98.3 F (36.8 C) 98.7 F (37.1 C)  TempSrc:  Oral Oral Oral  SpO2:  100% 98% 95%   Weight: 112.9 kg     Height: 5' 6 (1.676 m)       Intake/Output Summary (Last 24 hours) at 06/09/2024 0816 Last data filed at 06/09/2024 0157 Gross per 24 hour  Intake 8.8 ml  Output --  Net 8.8 ml   Filed Weights   06/08/24 1339 06/08/24 2127  Weight: 110.7 kg 112.9 kg    Data Reviewed: I have personally reviewed and interpreted daily labs, tele strips, imagings as discussed above. I reviewed all nursing notes, pharmacy notes, vitals, pertinent old records I have discussed plan of care as described above with RN and patient/family.  CBC: Recent Labs  Lab 06/08/24 1411  WBC 6.1  HGB 12.8  HCT 38.4  MCV 93.9  PLT 251   Basic Metabolic Panel: Recent Labs  Lab 06/08/24 1411  NA 136  K 3.4*  CL 98  CO2 21*  GLUCOSE 114*  BUN 7  CREATININE 0.75  CALCIUM  8.6*  MG 1.0*    Studies: CT ABDOMEN PELVIS W CONTRAST Result Date: 06/08/2024 EXAM: CT ABDOMEN AND PELVIS WITH CONTRAST 06/08/2024 05:40:13 PM TECHNIQUE: CT of the abdomen and pelvis was performed with the administration of 100 mL of iohexol  (OMNIPAQUE ) 300 MG/ML solution. Multiplanar reformatted images are provided for review. Automated exposure control, iterative reconstruction, and/or weight-based adjustment of the mA/kV was utilized to reduce the radiation dose to as low as reasonably achievable. COMPARISON: 01/25/2024 CLINICAL HISTORY: abdominal pain, epigastric FINDINGS: LOWER CHEST: No acute abnormality. LIVER: The liver is unremarkable. GALLBLADDER AND BILE DUCTS: Prior cholecystectomy. No biliary ductal dilatation. SPLEEN: No acute abnormality. PANCREAS: Mild stranding around the pancreas which may reflect acute pancreatitis. No ductal dilatation. ADRENAL GLANDS: No acute abnormality. KIDNEYS, URETERS AND BLADDER: No stones in the kidneys or ureters. No hydronephrosis. No perinephric or periureteral stranding. Urinary bladder is unremarkable. GI AND BOWEL: Stomach demonstrates no acute abnormality. There is no  bowel obstruction. PERITONEUM AND RETROPERITONEUM: Trace free fluid in the cul-de-sac. No free air. VASCULATURE: Aorta is normal in caliber. LYMPH NODES: No lymphadenopathy. REPRODUCTIVE ORGANS: No acute abnormality. BONES AND SOFT TISSUES: No acute osseous abnormality. No focal soft tissue abnormality. IMPRESSION: 1. Findings compatible with acute pancreatitis . 2. Trace free fluid in the cul-de-sac. Electronically signed by: Kevin Dover MD 06/08/2024 05:45 PM EST RP Workstation: [id number]    Scheduled Meds:  cyanocobalamin   1,000 mcg Intramuscular Q1200   Followed by   NOREEN ON 06/16/2024] vitamin B-12  1,000 mcg Oral Daily   insulin  aspart  0-15 Units Subcutaneous TID WC   insulin  aspart  0-5 Units Subcutaneous QHS   traZODone   100 mg Oral QHS   Continuous Infusions:  cefTRIAXone  (ROCEPHIN )  IV     lactated ringers  125 mL/hr at 06/09/24 0504   PRN Meds: HYDROmorphone  (DILAUDID ) injection, ondansetron  **OR** ondansetron  (ZOFRAN ) IV  Time spent: 55 minutes  Author: ELVAN SOR. MD [facility] 06/09/2024 8:16 AM  To reach On-call, see care teams to locate the attending and reach out to them via [email or web address]. If 7PM-7AM, please contact night-coverage If you still have difficulty reaching the attending provider, please page the [facility] (Director on Call) for [facility] on amion for assistance.

## 2024-06-10 DIAGNOSIS — K852 Alcohol induced acute pancreatitis without necrosis or infection: Secondary | ICD-10-CM | POA: Diagnosis not present

## 2024-06-10 LAB — CBC
HCT: 37.1 % (ref 36.0–46.0)
Hemoglobin: 12.6 g/dL (ref 12.0–15.0)
MCH: 32.1 pg (ref 26.0–34.0)
MCHC: 34 g/dL (ref 30.0–36.0)
MCV: 94.6 fL (ref 80.0–100.0)
Platelets: 194 K/uL (ref 150–400)
RBC: 3.92 MIL/uL (ref 3.87–5.11)
RDW: 13.9 % (ref 11.5–15.5)
WBC: 3.8 K/uL — ABNORMAL LOW (ref 4.0–10.5)
nRBC: 0 % (ref 0.0–0.2)

## 2024-06-10 LAB — LIPASE, BLOOD: Lipase: 77 U/L — ABNORMAL HIGH (ref 11–51)

## 2024-06-10 LAB — HEPATIC FUNCTION PANEL
ALT: 56 U/L — ABNORMAL HIGH (ref 0–44)
AST: 103 U/L — ABNORMAL HIGH (ref 15–41)
Albumin: 3.6 g/dL (ref 3.5–5.0)
Alkaline Phosphatase: 95 U/L (ref 38–126)
Bilirubin, Direct: 0.3 mg/dL — ABNORMAL HIGH (ref 0.0–0.2)
Indirect Bilirubin: 0.2 mg/dL — ABNORMAL LOW (ref 0.3–0.9)
Total Bilirubin: 0.4 mg/dL (ref 0.0–1.2)
Total Protein: 6.6 g/dL (ref 6.5–8.1)

## 2024-06-10 LAB — PHOSPHORUS: Phosphorus: 2.1 mg/dL — ABNORMAL LOW (ref 2.5–4.6)

## 2024-06-10 LAB — PROTIME-INR
INR: 1.8 — ABNORMAL HIGH (ref 0.8–1.2)
Prothrombin Time: 21.7 s — ABNORMAL HIGH (ref 11.4–15.2)

## 2024-06-10 LAB — BASIC METABOLIC PANEL WITH GFR
Anion gap: 10 (ref 5–15)
BUN: <5 mg/dL — ABNORMAL LOW (ref 6–20)
CO2: 25 mmol/L (ref 22–32)
Calcium: 9.1 mg/dL (ref 8.9–10.3)
Chloride: 103 mmol/L (ref 98–111)
Creatinine, Ser: 0.72 mg/dL (ref 0.44–1.00)
GFR, Estimated: >60 mL/min (ref >60)
Glucose, Bld: 135 mg/dL — ABNORMAL HIGH (ref 70–99)
Potassium: 3.4 mmol/L — ABNORMAL LOW (ref 3.5–5.1)
Sodium: 138 mmol/L (ref 135–145)

## 2024-06-10 LAB — GLUCOSE, CAPILLARY
Glucose-Capillary: 128 mg/dL — ABNORMAL HIGH (ref 70–99)
Glucose-Capillary: 123 mg/dL — ABNORMAL HIGH (ref 70–99)

## 2024-06-10 LAB — MAGNESIUM: Magnesium: 1.6 mg/dL — ABNORMAL LOW (ref 1.7–2.4)

## 2024-06-10 MED ORDER — ALUM & MAG HYDROXIDE-SIMETH 200-200-20 MG/5ML PO SUSP
15.0000 mL | Freq: Four times a day (QID) | ORAL | Status: DC | PRN
Start: 2024-06-10 — End: 2024-06-12
  Administered 2024-06-10 – 2024-06-11 (×4): 15 mL via ORAL
  Filled 2024-06-10 (×4): qty 30

## 2024-06-10 MED ORDER — POLYETHYLENE GLYCOL 3350 17 G PO PACK
17.0000 g | PACK | Freq: Two times a day (BID) | ORAL | Status: DC
  Administered 2024-06-10 – 2024-06-12 (×4): 17 g via ORAL
  Filled 2024-06-10 (×4): qty 1

## 2024-06-10 MED ORDER — BISACODYL 5 MG PO TBEC
10.0000 mg | DELAYED_RELEASE_TABLET | Freq: Once | ORAL | Status: AC
  Administered 2024-06-10: 10 mg via ORAL
  Filled 2024-06-10: qty 2

## 2024-06-10 MED ORDER — POTASSIUM PHOSPHATES 15 MMOLE/5ML IV SOLN
15.0000 mmol | Freq: Once | INTRAVENOUS | Status: AC
  Administered 2024-06-10: 15 mmol via INTRAVENOUS
  Filled 2024-06-10: qty 5

## 2024-06-10 MED ORDER — LOSARTAN POTASSIUM 50 MG PO TABS
50.0000 mg | ORAL_TABLET | Freq: Every day | ORAL | Status: DC
  Administered 2024-06-10 – 2024-06-12 (×3): 50 mg via ORAL
  Filled 2024-06-10 (×3): qty 1

## 2024-06-10 MED ORDER — SPIRONOLACTONE 25 MG PO TABS
25.0000 mg | ORAL_TABLET | Freq: Every day | ORAL | Status: DC
  Administered 2024-06-10 – 2024-06-12 (×3): 25 mg via ORAL
  Filled 2024-06-10 (×3): qty 1

## 2024-06-10 MED ORDER — MAGNESIUM SULFATE 2 GM/50ML IV SOLN
2.0000 g | Freq: Once | INTRAVENOUS | Status: AC
  Administered 2024-06-10: 2 g via INTRAVENOUS
  Filled 2024-06-10: qty 50

## 2024-06-10 MED ORDER — HYDROCORTISONE ACETATE 25 MG RE SUPP
25.0000 mg | Freq: Two times a day (BID) | RECTAL | Status: AC
  Administered 2024-06-10 – 2024-06-11 (×2): 25 mg via RECTAL
  Filled 2024-06-10 (×2): qty 1

## 2024-06-10 MED ORDER — WARFARIN SODIUM 3 MG PO TABS
3.5000 mg | ORAL_TABLET | Freq: Once | ORAL | Status: AC
  Administered 2024-06-10: 3.5 mg via ORAL
  Filled 2024-06-10: qty 1

## 2024-06-10 MED ORDER — BISACODYL 5 MG PO TBEC
10.0000 mg | DELAYED_RELEASE_TABLET | Freq: Every day | ORAL | Status: DC
  Administered 2024-06-11: 10 mg via ORAL
  Filled 2024-06-10: qty 2

## 2024-06-10 MED ORDER — WARFARIN SODIUM 3 MG PO TABS
3.3750 mg | ORAL_TABLET | Freq: Once | ORAL | Status: DC
  Filled 2024-06-10: qty 1

## 2024-06-10 MED ORDER — AMLODIPINE BESYLATE 5 MG PO TABS
5.0000 mg | ORAL_TABLET | Freq: Every day | ORAL | Status: DC
  Administered 2024-06-11: 5 mg via ORAL
  Filled 2024-06-10: qty 1

## 2024-06-10 MED ORDER — BISACODYL 10 MG RE SUPP: 10.0000 mg | Freq: Every day | RECTAL | Status: DC | PRN

## 2024-06-10 NOTE — Progress Notes (Signed)
 [facility] Progress Note  Patient: Sarah Sosa    FMW:[id number]  DOA: 06/08/2024     Date of Service: the patient was seen and examined on 06/10/2024  Chief Complaint  Patient presents with   Abdominal Pain   UTI   Brief hospital course:  Sarah Sosa is a 43 y.o. female with PMH of severe aortic stenosis s/p mechanical AVR 04/10/2022, Ascending Aortic Aneurysm s/p repair 03/2022, chronic diastolic heart failure, hypertension, hyperlipidemia, alcohol induced chronic pancreatitis, Alcohol abuse, GERD, Asthma, chronic pain syndrome, history of pelvic inflammatory disease, recurrent UTI, as reviewed from EMR, presented to [facility] ED with complaining of abdominal pain and UTI symptoms.    ED workup: Lipase elevated UA positive for UTI CT a/p: 1. Findings compatible with acute pancreatitis . 2. Trace free fluid in the cul-de-sac.  TRH was consulted for admission and further management as below.   Assessment and Plan:  # Acute pancreatitis secondary to EtOH abuse Continue IV fluid for hydration As needed pain meds Clear liquid diet PPI for GI ppx Trend Lipase   # EtOH abuse, abstinence counseling done. Continue to monitor on CIWA protocol while withdrawal symptoms Continue thiamine , multivitamin and folic acid   # UTI UA positive Continue ceftriaxone  IV Follow urine culture, showing E. coli, follow sensitivity report  # Severe aortic stenosis s/p mechanical AVR 04/10/2022, Ascending Aortic Aneurysm s/p repair 03/2022,  Continue Coumadin  and monitor INR.  Pharmacy consulted    # Chronic diastolic heart failure, HTN & HLD 11/25 resumed losartan  50 mg and Aldactone  25 mg p.o. daily and  Started amlodipine  at low-dose 5 mg p.o. daily  Monitor BP and titrate medications accordingly Use hydralazine  as needed  # Vitamin B12 level 257, goal >400: Started vitamin B12 1000 mcg IM injection daily during hospital stay, followed by oral supplement.  Follow-up PCP to repeat  vitamin B12 level after 3 to 6 months.  # Vitamin D  deficiency: started vitamin D  50,000 units p.o. weekly, follow with PCP to repeat vitamin D  level after 3 to 6 months.  # Hypomagnesemia, mag repleted. # Hypophosphatemia, Phos repleted. Monitor electrolytes and replete as needed.  # Prediabetic Patient was on metformin  at home which has been held for now Continue NovoLog  sliding scale Monitor CBG  # Constipation, started laxatives 11/25 C/o rectal pain possible hemorrhoids, hydrocortisone  suppositories twice daily x 2 doses   Body mass index is 40.16 kg/m.  Interventions:  Diet: CLD DVT Prophylaxis: Coumadin   Advance goals of care discussion: Full code  Family Communication: family was not present at bedside, at the time of interview.  The pt provided permission to discuss medical plan with the family. Opportunity was given to ask question and all questions were answered satisfactorily.   Disposition:  Pt is from Home, admitted with acute pancreatitis and UTI, still has intractable abdominal pain, which precludes a safe discharge. Discharge to home, when stable, may need few days to improve.  Subjective: No significant events overnight.  Patient still has significant abdominal pain at 2 9/10, tolerating clear liquid diet well but not ready to advance yet due to significant pain Denied any other complaints.   Physical Exam: General: NAD, lying comfortably Appear in no distress, affect appropriate Eyes: PERRLA ENT: Oral Mucosa Clear, moist  Neck: no JVD,  Cardiovascular: S1 and S2 Present, no Murmur,  Respiratory: good respiratory effort, Bilateral Air entry equal and Decreased, no Crackles, no wheezes Abdomen: BS present, Soft, obese and upper Abd tenderness,  Skin: no rashes Extremities: no  Pedal edema, no calf tenderness Neurologic: without any new focal findings Gait not checked due to patient safety concerns  Vitals:   06/09/24 1432 06/09/24 2136 06/10/24 0448  06/10/24 0803  BP: 119/62 (!) 161/88 (!) 155/76 (!) 169/77  Pulse: (!) 56 61 61 66  Resp: 16 16 16 18   Temp: 97.9 F (36.6 C) 98.2 F (36.8 C) 98 F (36.7 C) 98.5 F (36.9 C)  TempSrc: Oral Oral Oral   SpO2: 100% 100% 99% 100%  Weight:      Height:       No intake or output data in the 24 hours ending 06/10/24 1525  Filed Weights   06/08/24 1339 06/08/24 2127  Weight: 110.7 kg 112.9 kg    Data Reviewed: I have personally reviewed and interpreted daily labs, tele strips, imagings as discussed above. I reviewed all nursing notes, pharmacy notes, vitals, pertinent old records I have discussed plan of care as described above with RN and patient/family.  CBC: Recent Labs  Lab 06/08/24 1411 06/09/24 0834 06/10/24 0643  WBC 6.1 6.2 3.8*  HGB 12.8 12.2 12.6  HCT 38.4 36.5 37.1  MCV 93.9 96.6 94.6  PLT 251 196 194   Basic Metabolic Panel: Recent Labs  Lab 06/08/24 1411 06/09/24 0834 06/10/24 0643  NA 136 136 138  K 3.4* 3.8 3.4*  CL 98 101 103  CO2 21* 21* 25  GLUCOSE 114* 66* 135*  BUN 7 7 <5*  CREATININE 0.75 0.67 0.72  CALCIUM  8.6* 8.6* 9.1  MG 1.0* 1.6* 1.6*  PHOS  --  2.3* 2.1*    Studies: No results found.   Scheduled Meds:  acetaminophen   650 mg Oral TID   [START ON 06/11/2024] amLODipine   5 mg Oral Daily   [START ON 06/11/2024] bisacodyl   10 mg Oral QHS   bisacodyl   10 mg Oral Once   cyanocobalamin   1,000 mcg Intramuscular Q1200   Followed by   NOREEN ON 06/16/2024] vitamin B-12  1,000 mcg Oral Daily   folic acid   1 mg Oral Daily   hydrocortisone   25 mg Rectal BID   insulin  aspart  0-15 Units Subcutaneous TID WC   insulin  aspart  0-5 Units Subcutaneous QHS   losartan   50 mg Oral Daily   multivitamin with minerals  1 tablet Oral Daily   pantoprazole   40 mg Oral BID   polyethylene glycol  17 g Oral BID   spironolactone   25 mg Oral Daily   thiamine   100 mg Oral Daily   Or   thiamine   100 mg Intravenous Daily   traZODone   100 mg Oral QHS    Vitamin D  (Ergocalciferol )  50,000 Units Oral Q7 days   warfarin  3.5 mg Oral ONCE-1600   Warfarin - Pharmacist Dosing Inpatient   Does not apply q1600   Continuous Infusions:  cefTRIAXone  (ROCEPHIN )  IV 1 g (06/09/24 1627)   potassium PHOSPHATE  IVPB (in mmol) 15 mmol (06/10/24 1055)   PRN Meds: alum & mag hydroxide-simeth, bisacodyl , hydrALAZINE  **OR** hydrALAZINE , HYDROmorphone  (DILAUDID ) injection, HYDROmorphone , ipratropium-albuterol , LORazepam  **OR** LORazepam , ondansetron  **OR** ondansetron  (ZOFRAN ) IV  Time spent: 55 minutes  Author: ELVAN SOR. MD [facility] 06/10/2024 3:25 PM  To reach On-call, see care teams to locate the attending and reach out to them via [email or web address]. If 7PM-7AM, please contact night-coverage If you still have difficulty reaching the attending provider, please page the [facility] (Director on Call) for [facility] on amion for assistance.

## 2024-06-10 NOTE — Progress Notes (Signed)
 PHARMACY CONSULT NOTE - FOLLOW UP  Pharmacy Consult for Electrolyte Monitoring and Replacement   Recent Labs: Potassium (mmol/L)  Date Value  06/10/2024 3.4 (L)   Magnesium  (mg/dL)  Date Value  88/74/7974 1.6 (L)   Calcium  (mg/dL)  Date Value  88/74/7974 9.1   Albumin (g/dL)  Date Value  88/74/7974 3.6   Phosphorus (mg/dL)  Date Value  88/74/7974 2.1 (L)   Sodium (mmol/L)  Date Value  06/10/2024 138     Assessment: 43 y.o. female with medical history significant of alcohol abuse, history of pancreatitis, GERD, asthma, chronic pain syndrome, essential hypertension, history of pelvic inflammatory disease, recurrent UTI who presented to the ER with suspicion of UTI. Pharmacy is asked to follow and replace electrolytes  Goal of Therapy:  Electrolytes WNL  Plan:  ---2 grams IV magnesium  sulfate x 1 ---15 mmol IV potassium phosphate  x 1 (contains 22 mEq IV potassium) ---recheck electrolytes in am  Adriana JONETTA Bolster ,PharmD Clinical Pharmacist 06/10/2024 7:59 AM

## 2024-06-10 NOTE — TOC CM/SW Note (Signed)
 RNCM met with patient at the bedside, RNCM introduced role and explained that discharge planning would be discussed. When I started talking to her and asking her questions, she said that a lady was in here yesterday asking me the same questions and she didn't want to answer them again. She was in a lot of pain and visibly uncomfortable. Stated that she didn't have any questions or concerns. Resource Guide Adult OP Counseling/Substance Abuse Resource Guide Adult Residential Substance Abuse added to AVS. RNCM will continue to follow for discharge planning needs.

## 2024-06-10 NOTE — Progress Notes (Addendum)
 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  Allergies  Allergen Reactions   Latex Anaphylaxis, Hives and Itching    Other reaction(s): Other (See Comments) SKIN BURNING & PEELING    Lisinopril  Swelling    Other Reaction(s): Angioedema   Nsaids Other (See Comments)    GI Ulcers   Tomato Anaphylaxis, Hives and Itching   Gabapentin  Palpitations   Metformin      Other Reaction(s): Abdominal Pain  Causes pancreatitis to worsen   Semaglutide     Other Reaction(s): Other (See Comments)    Acute on chronic pancreatitis 09/2023   Duloxetine Anxiety    Makes her jittery   Tramadol Hives and Itching    Patient Measurements: Height: 5' 6 (167.6 cm) Weight: 112.9 kg (248 lb 12.8 oz) IBW/kg (Calculated) : 59.3 HEPARIN  DW (KG): 85.7  Vital Signs: Temp: 98 F (36.7 C) (11/25 0448) Temp Source: Oral (11/25 0448) BP: 155/76 (11/25 0448) Pulse Rate: 61 (11/25 0448)  Labs: Recent Labs    06/08/24 1411 06/09/24 0834 06/10/24 0643  HGB 12.8 12.2 12.6  HCT 38.4 36.5 37.1  PLT 251 196 194  LABPROT  --  25.3* 21.7*  INR  --  2.2* 1.8*  CREATININE 0.75 0.67  --     Estimated Creatinine Clearance: 116.7 mL/min (by C-G formula based on SCr of 0.67 mg/dL).   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  Pt was on warfarin 3.75 mg PO daily PTA,  last dose  unknown.   Because of On-X valve , her goal INR is 1.5 - 2.0 (plus daily aspirin ).    Goal of Therapy: INR :  1.5 - 2.0 -- On-X type mechanical aortic valve Monitor platelets by anticoagulation protocol: Yes   Plan:  --INR now therapeutic: will order 3.5 mg warfarin x 1 for today (system does not allow administration of 3.375mg  dose) --INR daily to guide therapy --CBC at least once weekly  Adriana JONETTA Bolster 06/10/2024,7:29 AM

## 2024-06-10 NOTE — Plan of Care (Signed)
  Problem: Skin Integrity: Goal: Risk for impaired skin integrity will decrease Outcome: Progressing   Problem: Tissue Perfusion: Goal: Adequacy of tissue perfusion will improve Outcome: Progressing   Problem: Clinical Measurements: Goal: Ability to maintain clinical measurements within normal limits will improve Outcome: Progressing Goal: Respiratory complications will improve Outcome: Progressing Goal: Cardiovascular complication will be avoided Outcome: Progressing   Problem: Coping: Goal: Ability to adjust to condition or change in health will improve Outcome: Not Progressing

## 2024-06-11 ENCOUNTER — Other Ambulatory Visit: Payer: Self-pay

## 2024-06-11 DIAGNOSIS — F1911 Other psychoactive substance abuse, in remission: Secondary | ICD-10-CM

## 2024-06-11 DIAGNOSIS — K852 Alcohol induced acute pancreatitis without necrosis or infection: Secondary | ICD-10-CM | POA: Diagnosis not present

## 2024-06-11 DIAGNOSIS — F1010 Alcohol abuse, uncomplicated: Secondary | ICD-10-CM | POA: Diagnosis not present

## 2024-06-11 DIAGNOSIS — I5032 Chronic diastolic (congestive) heart failure: Secondary | ICD-10-CM

## 2024-06-11 LAB — HEPATIC FUNCTION PANEL
ALT: 42 U/L (ref 0–44)
AST: 43 U/L — ABNORMAL HIGH (ref 15–41)
Albumin: 3.9 g/dL (ref 3.5–5.0)
Alkaline Phosphatase: 94 U/L (ref 38–126)
Bilirubin, Direct: 0.2 mg/dL (ref 0.0–0.2)
Indirect Bilirubin: 0.3 mg/dL (ref 0.3–0.9)
Total Bilirubin: 0.5 mg/dL (ref 0.0–1.2)
Total Protein: 7.2 g/dL (ref 6.5–8.1)

## 2024-06-11 LAB — GLUCOSE, CAPILLARY
Glucose-Capillary: 128 mg/dL — ABNORMAL HIGH (ref 70–99)
Glucose-Capillary: 123 mg/dL — ABNORMAL HIGH (ref 70–99)
Glucose-Capillary: 133 mg/dL — ABNORMAL HIGH (ref 70–99)
Glucose-Capillary: 109 mg/dL — ABNORMAL HIGH (ref 70–99)

## 2024-06-11 LAB — PHOSPHORUS: Phosphorus: 2.5 mg/dL (ref 2.5–4.6)

## 2024-06-11 LAB — LIPASE, BLOOD: Lipase: 63 U/L — ABNORMAL HIGH (ref 11–51)

## 2024-06-11 LAB — BASIC METABOLIC PANEL WITH GFR
Anion gap: 11 (ref 5–15)
BUN: <5 mg/dL — ABNORMAL LOW (ref 6–20)
CO2: 26 mmol/L (ref 22–32)
Calcium: 9.4 mg/dL (ref 8.9–10.3)
Chloride: 100 mmol/L (ref 98–111)
Creatinine, Ser: 0.65 mg/dL (ref 0.44–1.00)
GFR, Estimated: >60 mL/min (ref >60)
Glucose, Bld: 122 mg/dL — ABNORMAL HIGH (ref 70–99)
Potassium: 3.3 mmol/L — ABNORMAL LOW (ref 3.5–5.1)
Sodium: 137 mmol/L (ref 135–145)

## 2024-06-11 LAB — CBC
HCT: 40 % (ref 36.0–46.0)
Hemoglobin: 13.6 g/dL (ref 12.0–15.0)
MCH: 31.9 pg (ref 26.0–34.0)
MCHC: 34 g/dL (ref 30.0–36.0)
MCV: 93.7 fL (ref 80.0–100.0)
Platelets: 207 K/uL (ref 150–400)
RBC: 4.27 MIL/uL (ref 3.87–5.11)
RDW: 13.7 % (ref 11.5–15.5)
WBC: 3.3 K/uL — ABNORMAL LOW (ref 4.0–10.5)
nRBC: 0 % (ref 0.0–0.2)

## 2024-06-11 LAB — URINE CULTURE: Culture: >=100000 — AB

## 2024-06-11 LAB — PROTIME-INR
INR: 1.7 — ABNORMAL HIGH (ref 0.8–1.2)
Prothrombin Time: 20.4 s — ABNORMAL HIGH (ref 11.4–15.2)

## 2024-06-11 LAB — MAGNESIUM: Magnesium: 1.6 mg/dL — ABNORMAL LOW (ref 1.7–2.4)

## 2024-06-11 MED ORDER — CEPHALEXIN 500 MG PO CAPS
500.0000 mg | ORAL_CAPSULE | Freq: Four times a day (QID) | ORAL | Status: DC
  Administered 2024-06-11 – 2024-06-12 (×4): 500 mg via ORAL
  Filled 2024-06-11 (×4): qty 1

## 2024-06-11 MED ORDER — AMLODIPINE BESYLATE 10 MG PO TABS
10.0000 mg | ORAL_TABLET | Freq: Every day | ORAL | Status: DC
  Administered 2024-06-12: 10 mg via ORAL
  Filled 2024-06-11: qty 1

## 2024-06-11 MED ORDER — WARFARIN SODIUM 3 MG PO TABS
3.5000 mg | ORAL_TABLET | Freq: Once | ORAL | Status: AC
  Administered 2024-06-11: 3.5 mg via ORAL
  Filled 2024-06-11: qty 1

## 2024-06-11 MED ORDER — HYDROMORPHONE HCL 2 MG PO TABS
2.0000 mg | ORAL_TABLET | Freq: Three times a day (TID) | ORAL | 0 refills | Status: DC | PRN
  Filled 2024-06-11: qty 15, 5d supply, fill #0

## 2024-06-11 MED ORDER — POTASSIUM CHLORIDE 20 MEQ PO PACK
40.0000 meq | PACK | Freq: Once | ORAL | Status: AC
  Administered 2024-06-11: 40 meq via ORAL
  Filled 2024-06-11: qty 2

## 2024-06-11 MED ORDER — MAGNESIUM SULFATE 2 GM/50ML IV SOLN
2.0000 g | Freq: Once | INTRAVENOUS | Status: AC
  Administered 2024-06-11: 2 g via INTRAVENOUS
  Filled 2024-06-11: qty 50

## 2024-06-11 MED ORDER — HYDROMORPHONE HCL 1 MG/ML IJ SOLN
1.0000 mg | Freq: Four times a day (QID) | INTRAMUSCULAR | Status: DC | PRN
  Administered 2024-06-11 – 2024-06-12 (×4): 1 mg via INTRAVENOUS
  Filled 2024-06-11 (×4): qty 1

## 2024-06-11 NOTE — Plan of Care (Signed)

## 2024-06-11 NOTE — TOC Initial Note (Signed)
 Transition of Care [facility]) - Initial/Assessment Note    Patient Details  Name: Sarah Sosa MRN: [id number] Date of Birth: February 04, 1981  Transition of Care [facility]) CM/SW Contact:    Sarah ONEIDA Haddock, RN Phone Number: 06/11/2024, 10:14 AM  Clinical Narrative:                  Late entry.  Met with patient at bedside 11/24  Admitted qnm:Jrluz pancreatitis secondary to EtOH abuse  Admitted from: home with Children PCP: Niess  Current home health/prior home health/DME: [facility], which she states she does not need to use  Patient states at discharge her daughter has her care and will transport    Expected Discharge Plan: Home/Self Care     Patient Goals and CMS Choice            Expected Discharge Plan and Services       Living arrangements for the past 2 months: Apartment                                      Prior Living Arrangements/Services Living arrangements for the past 2 months: Apartment Lives with:: Adult Children              Current home services: DME    Activities of Daily Living   ADL Screening (condition at time of admission) Independently performs ADLs?: Yes (appropriate for developmental age) Is the patient deaf or have difficulty hearing?: No Does the patient have difficulty seeing, even when wearing glasses/contacts?: No Does the patient have difficulty concentrating, remembering, or making decisions?: No  Permission Sought/Granted                  Emotional Assessment              Admission diagnosis:  Acute alcoholic pancreatitis [K85.20] Acute pancreatitis, unspecified complication status, unspecified pancreatitis type [K85.90] Patient Active Problem List   Diagnosis Date Noted   Continuous dependence on cigarette smoking 04/28/2024   Pancreatitis 01/25/2024   Opioid use disorder 12/09/2023   Acute on chronic pancreatitis (HCC) 10/21/2023   Dyslipidemia 10/21/2023   Chronic pain syndrome 10/21/2023   Alcohol  intoxication 10/21/2023   Type 2 diabetes mellitus without complications (HCC) 10/21/2023   Status post aortic valve replacement with metallic valve 10/21/2023   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   Adnexal cyst 09/15/2023   Chest pain 09/15/2023   Chronic diastolic CHF (congestive heart failure) (HCC) 09/15/2023   DM2 (diabetes mellitus, type 2) (HCC) 09/15/2023   Acute pain 09/15/2023   Elevated troponin 07/13/2023   Subtherapeutic international normalized ratio (INR) 07/13/2023   Chronic pancreatitis (HCC) 07/13/2023   S/p aortic valve replacement with 'On-X valve (INR target of 1.5 to 2) and ascending aortic graft ([facility], 03/2022) 07/13/2023   CAP (community acquired pneumonia) 07/13/2023   Opioid overdose, accidental or unintentional, initial encounter (HCC) 03/20/2023   Vomiting 03/20/2023   Obesity, Class III, BMI 40-49.9 (morbid obesity) (HCC) 03/20/2023   History of pancreatitis 03/20/2023   Acute respiratory failure with hypoxia (HCC) 03/20/2023   Chronic use of opiate drug for therapeutic purpose 06/02/2022   S/P ascending aortic replacement 04/12/2022   Hypomagnesemia 11/23/2021   Essential hypertension 11/20/2021   GERD (gastroesophageal reflux disease) 11/20/2021   (HFpEF) heart failure with preserved ejection fraction (HCC) 11/20/2021   Peripheral neuropathy 11/20/2021   Asthma, chronic 11/20/2021   Acute alcoholic pancreatitis 11/20/2021  Alcohol abuse 11/20/2021   Acute pancreatitis 11/20/2021   History of heavy alcohol consumption 05/22/2021   Recurrent pancreatitis 06/04/2020   Intractable nausea and vomiting    Marijuana use    Hypertensive urgency 04/03/2020   Hypokalemia 04/03/2020   Tobacco abuse 04/03/2020   History of substance abuse (HCC) 04/03/2020   Ileus (HCC) 04/21/2018   Postoperative ileus (HCC) 04/20/2018   Postoperative state 04/15/2018   Uterine leiomyoma 03/19/2018   Menorrhagia with regular cycle 03/19/2018   Chronic pain  disorder 03/19/2018   Pelvic pain 03/19/2018   Primary hypertension 03/19/2018   PCP:  [facility]:   [facility] [place] - [address] [place] [address] Phone: [phone] Fax: [phone]  [facility] [address], [place] - [address] [place] [address] Phone: [phone] Fax: [phone]  [facility] [address] [place] [address] Phone: [phone] Fax: [phone]     Social Drivers of Health (SDOH) Social History: SDOH Screenings   Food Insecurity: No Food Insecurity (06/08/2024)  Housing: Low Risk  (06/08/2024)  Transportation Needs: No Transportation Needs (06/08/2024)  Utilities: Not At Risk (06/08/2024)  Financial Resource Strain: High Risk (07/19/2023)   Received from [facility]  Social Connections: Moderately Isolated (12/09/2023)  Stress: No Stress Concern Present (08/31/2023)   Received from [facility]  Tobacco Use: High Risk (06/08/2024)   SDOH Interventions:     Readmission Risk Interventions    06/11/2024   10:13 AM 11/08/2023    2:16 PM 10/24/2023   11:27 AM  Readmission Risk Prevention Plan  Transportation Screening Complete Complete Complete  Medication Review Oceanographer) Complete -- Complete  PCP or Specialist appointment within 3-5 days of discharge  Complete Complete  HRI or Home Care Consult -- Not Complete   HRI or Home Care Consult Pt Refusal Comments  Not applicable   SW Recovery Care/Counseling Consult   Complete  Palliative Care Screening Not Applicable Not Applicable Not Applicable  Skilled Nursing Facility Not Applicable Not Applicable Not Applicable

## 2024-06-11 NOTE — Plan of Care (Signed)

## 2024-06-11 NOTE — Progress Notes (Signed)
 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  Allergies  Allergen Reactions   Latex Anaphylaxis, Hives and Itching    Other reaction(s): Other (See Comments) SKIN BURNING & PEELING    Lisinopril  Swelling    Other Reaction(s): Angioedema   Nsaids Other (See Comments)    GI Ulcers   Tomato Anaphylaxis, Hives and Itching   Gabapentin  Palpitations   Metformin      Other Reaction(s): Abdominal Pain  Causes pancreatitis to worsen   Semaglutide     Other Reaction(s): Other (See Comments)    Acute on chronic pancreatitis 09/2023   Duloxetine Anxiety    Makes her jittery   Tramadol Hives and Itching    Patient Measurements: Height: 5' 6 (167.6 cm) Weight: 112.9 kg (248 lb 12.8 oz) IBW/kg (Calculated) : 59.3 HEPARIN  DW (KG): 85.7  Vital Signs: Temp: 98.8 F (37.1 C) (11/26 0337) BP: 173/85 (11/26 0337) Pulse Rate: 61 (11/26 0337)  Labs: Recent Labs    06/09/24 0834 06/10/24 0643 06/11/24 0335  HGB 12.2 12.6 13.6  HCT 36.5 37.1 40.0  PLT 196 194 207  LABPROT 25.3* 21.7* 20.4*  INR 2.2* 1.8* 1.7*  CREATININE 0.67 0.72 0.65    Estimated Creatinine Clearance: 116.7 mL/min (by C-G formula based on SCr of 0.65 mg/dL).   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  Pt was on warfarin 3.75 mg PO daily PTA,  last dose unknown.   Because of On-X  valve , her goal INR is 1.5 - 2.0 (plus daily aspirin ).    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

## 2024-06-11 NOTE — Progress Notes (Signed)
 PHARMACY CONSULT NOTE - FOLLOW UP  Pharmacy Consult for Electrolyte Monitoring and Replacement   Recent Labs: Potassium (mmol/L)  Date Value  06/11/2024 3.3 (L)   Magnesium  (mg/dL)  Date Value  88/73/7974 1.6 (L)   Calcium  (mg/dL)  Date Value  88/73/7974 9.4   Albumin (g/dL)  Date Value  88/73/7974 3.9   Phosphorus (mg/dL)  Date Value  88/73/7974 2.5   Sodium (mmol/L)  Date Value  06/11/2024 137     Assessment: 43 y.o. female with medical history significant of alcohol abuse, history of pancreatitis, GERD, asthma, chronic pain syndrome, essential hypertension, history of pelvic inflammatory disease, recurrent UTI who presented to the ER with suspicion of UTI. Pharmacy is asked to follow and replace electrolytes  Goal of Therapy:  Electrolytes WNL  Plan:  ---2 grams IV magnesium  sulfate x 1 --- KCL 40 mEq PO x 1 dose  ---recheck electrolytes in am  Estill CHRISTELLA Lutes, PharmD, BCPS Clinical Pharmacist 06/11/2024 7:11 AM

## 2024-06-11 NOTE — Progress Notes (Signed)
 [facility]  - [place] [facility]   PATIENT NAME: Sarah Sosa    MR#:  [id number]  DATE OF BIRTH:  11/18/1980  SUBJECTIVE:  no family at bedside. Patient has been recurrently admitted for acute on chronic pancreatitis. Per RN asking for IV pain meds quite frequently. Tolerating liquid diet. Discussed with her regarding advancing to soft alternate with clear she is agreeable. Discussed with patient I will be adjusting some of the pain medicine.    VITALS:  Blood pressure (!) 193/98, pulse 63, temperature 98.8 F (37.1 C), resp. rate 18, height 5' 6 (1.676 m), weight 112.9 kg, last menstrual period 03/25/2018, SpO2 99%.  PHYSICAL EXAMINATION:   GENERAL:  43 y.o.-year-old patient with no acute distress. obese LUNGS: Normal breath sounds bilaterally, no wheezing CARDIOVASCULAR: S1, S2 normal. No murmur   ABDOMEN: Soft, diffusely tender, nondistended.Bowel sounds present.  EXTREMITIES: No  edema b/l.    NEUROLOGIC: nonfocal  patient is alert and awake   LABORATORY PANEL:  CBC Recent Labs  Lab 06/11/24 0335  WBC 3.3*  HGB 13.6  HCT 40.0  PLT 207    Chemistries  Recent Labs  Lab 06/11/24 0335  NA 137  K 3.3*  CL 100  CO2 26  GLUCOSE 122*  BUN <5*  CREATININE 0.65  CALCIUM  9.4  MG 1.6*  AST 43*  ALT 42  ALKPHOS 94  BILITOT 0.5    Assessment and Plan  Topanga Walker is a 43 y.o. female with PMH of severe aortic stenosis s/p mechanical AVR 04/10/2022, Ascending Aortic Aneurysm s/p repair 03/2022, chronic diastolic heart failure, hypertension, hyperlipidemia, alcohol induced chronic pancreatitis, Alcohol abuse, GERD, Asthma, chronic pain syndrome, history of pelvic inflammatory disease, recurrent UTI, as reviewed from EMR, presented to [facility] ED with complaining of abdominal pain and UTI symptoms.    ED workup: Lipase elevated UA positive for UTI CT a/p: 1. Findings compatible with acute pancreatitis . 2. Trace free fluid in the cul-de-sac.  #  Acute pancreatitis secondary to EtOH abuse (pt does not give reliable info) --recieved IV fluid for hydration --As needed pain meds--weaning down --Clear liquid diet--soft diet --PPI for GI ppx  -- LFTs and lipase trending down   # EtOH abuse, abstinence counseling done. -- No signs symptoms of withdrawal. Patient does not give reliable history of alcohol consumption. --Continue thiamine , multivitamin and folic acid    #E. coli UTI --UA positive --Continue ceftriaxone  IV-- change to PO now since pain sensitive  # Severe aortic stenosis s/p mechanical AVR 04/10/2022, Ascending Aortic Aneurysm s/p repair 03/2022,  -- Coumadin  and monitor INR. -- Pharmacy consulted    # Chronic diastolic heart failure, HTN & HLD --11/25 resumed losartan  50 mg and Aldactone  25 mg p.o. daily and  Started amlodipine  at low-dose 5 mg p.o. daily  Monitor BP and titrate medications accordingly Use hydralazine  as needed    # Vitamin D  deficiency: started vitamin D  50,000 units p.o. weekly, follow with PCP to repeat vitamin D  level after 3 to 6 months.   # Hypomagnesemia, mag repleted. # Hypophosphatemia, Phos repleted. Monitor electrolytes and replete as needed.   # Prediabetic Patient was on metformin  at home which has been held for now Continue NovoLog  sliding scale Monitor CBG   # Constipation, started laxatives 11/25 C/o rectal pain possible hemorrhoids, hydrocortisone  suppositories twice daily x 2 doses  Chronic pain syndrome and Opioid use disorder (old records) --discussed with pt that labs are showing improvement and I will be decreasing her  IV dilaudid . --also recommended f/u pain clinic --takes Suboxone      Body mass index is 40.16 kg/m.  Interventions: Procedures: Family communication :none Consults :none CODE STATUS: full DVT Prophylaxis :warfarin Level of care: Med-Surg Status is: Inpatient Remains inpatient appropriate because: monitor one more day    TOTAL TIME TAKING CARE  OF THIS PATIENT: 35 minutes.  >50% time spent on counselling and coordination of care  Note: This dictation was prepared with Dragon dictation along with smaller phrase technology. Any transcriptional errors that result from this process are unintentional.  Leita Blanch M.D    [facility]   CC: Primary care physician; [facility]

## 2024-06-12 DIAGNOSIS — K852 Alcohol induced acute pancreatitis without necrosis or infection: Secondary | ICD-10-CM | POA: Diagnosis not present

## 2024-06-12 DIAGNOSIS — K219 Gastro-esophageal reflux disease without esophagitis: Secondary | ICD-10-CM | POA: Diagnosis not present

## 2024-06-12 DIAGNOSIS — F1911 Other psychoactive substance abuse, in remission: Secondary | ICD-10-CM | POA: Diagnosis not present

## 2024-06-12 LAB — BASIC METABOLIC PANEL WITH GFR
Anion gap: 10 (ref 5–15)
BUN: <5 mg/dL — ABNORMAL LOW (ref 6–20)
CO2: 25 mmol/L (ref 22–32)
Calcium: 9.2 mg/dL (ref 8.9–10.3)
Chloride: 102 mmol/L (ref 98–111)
Creatinine, Ser: 0.63 mg/dL (ref 0.44–1.00)
GFR, Estimated: >60 mL/min (ref >60)
Glucose, Bld: 110 mg/dL — ABNORMAL HIGH (ref 70–99)
Potassium: 3.5 mmol/L (ref 3.5–5.1)
Sodium: 136 mmol/L (ref 135–145)

## 2024-06-12 LAB — MAGNESIUM: Magnesium: 1.7 mg/dL (ref 1.7–2.4)

## 2024-06-12 LAB — PHOSPHORUS: Phosphorus: 2.3 mg/dL — ABNORMAL LOW (ref 2.5–4.6)

## 2024-06-12 LAB — PROTIME-INR
INR: 1.6 — ABNORMAL HIGH (ref 0.8–1.2)
Prothrombin Time: 20 s — ABNORMAL HIGH (ref 11.4–15.2)

## 2024-06-12 MED ORDER — MAGNESIUM SULFATE 2 GM/50ML IV SOLN
2.0000 g | Freq: Once | INTRAVENOUS | Status: DC
  Filled 2024-06-12: qty 50

## 2024-06-12 MED ORDER — ADULT MULTIVITAMIN W/MINERALS CH: 1.0000 | ORAL_TABLET | Freq: Every day | ORAL | 1 refills | Status: AC

## 2024-06-12 MED ORDER — CEPHALEXIN 500 MG PO CAPS: 500.0000 mg | ORAL_CAPSULE | Freq: Four times a day (QID) | ORAL | 0 refills | Status: AC

## 2024-06-12 MED ORDER — MAGNESIUM CHLORIDE 64 MG PO TBEC
2.0000 | DELAYED_RELEASE_TABLET | Freq: Every day | ORAL | Status: DC
  Administered 2024-06-12: 128 mg via ORAL
  Filled 2024-06-12: qty 2

## 2024-06-12 MED ORDER — VITAMIN D (ERGOCALCIFEROL) 1.25 MG (50000 UNIT) PO CAPS: 50000.0000 [IU] | ORAL_CAPSULE | ORAL | 4 refills | Status: AC

## 2024-06-12 MED ORDER — WARFARIN SODIUM 3 MG PO TABS
3.5000 mg | ORAL_TABLET | Freq: Once | ORAL | Status: DC
  Filled 2024-06-12: qty 1

## 2024-06-12 MED ORDER — HYDROMORPHONE HCL 2 MG PO TABS
2.0000 mg | ORAL_TABLET | Freq: Four times a day (QID) | ORAL | Status: DC | PRN
  Administered 2024-06-12: 2 mg via ORAL
  Filled 2024-06-12: qty 1

## 2024-06-12 MED ORDER — HYDROMORPHONE HCL 2 MG PO TABS: 2.0000 mg | ORAL_TABLET | Freq: Three times a day (TID) | ORAL | 0 refills | Status: DC | PRN

## 2024-06-12 MED ORDER — K PHOS MONO-SOD PHOS DI & MONO 155-852-130 MG PO TABS
500.0000 mg | ORAL_TABLET | ORAL | Status: AC
Start: 2024-06-12 — End: 2024-06-12
  Administered 2024-06-12 (×2): 500 mg via ORAL
  Filled 2024-06-12 (×2): qty 2

## 2024-06-12 NOTE — Discharge Summary (Signed)
 Physician Discharge Summary   Patient: Sarah Sosa MRN: [id number] DOB: 1981-02-03  Admit date:     06/08/2024  Discharge date: 06/12/24  Discharge Physician: Leita Blanch   PCP: [facility], [facility]   Recommendations at discharge:    Keep log of your sugars and d/w your PCP other modes of Diabetes management. Defer to PCP regarding endocrinology referral for management of DM-2 F/u PCP in 1-2 weeks  Discharge Diagnoses: Principal Problem:   Acute alcoholic pancreatitis Active Problems:   Intractable nausea and vomiting   Chronic pancreatitis (HCC)   Alcohol abuse   Hypomagnesemia   Essential hypertension   Subtherapeutic international normalized ratio (INR)   (HFpEF) heart failure with preserved ejection fraction (HCC)   GERD (gastroesophageal reflux disease)   S/p aortic valve replacement with 'On-X valve (INR target of 1.5 to 2) and ascending aortic graft ([facility], 03/2022)   Dyslipidemia   DM2 (diabetes mellitus, type 2) (HCC)   Primary hypertension   History of substance abuse (HCC)   Sarah Sosa is a 42 y.o. female with PMH of severe aortic stenosis s/p mechanical AVR 04/10/2022, Ascending Aortic Aneurysm s/p repair 03/2022, chronic diastolic heart failure, hypertension, hyperlipidemia, alcohol induced chronic pancreatitis, Alcohol abuse, GERD, Asthma, chronic pain syndrome, history of pelvic inflammatory disease, recurrent UTI, as reviewed from EMR, presented to [facility] ED with complaining of abdominal pain and UTI symptoms.     ED workup: Lipase elevated UA positive for UTI CT abd/pelvis: 1. Findings compatible with acute pancreatitis . 2. Trace free fluid in the cul-de-sac.   # Acute on Chronic pancreatitis  --recieved IV fluid for hydration --As needed pain meds--weaning down --Clear liquid diet--soft diet --PPI for GI ppx  -- LFTs and lipase trending down --pt will resume creon  at discharge  Chronic pain syndrome and Opioid use disorder (old  records) --discussed with pt that labs are showing improvement  --also recommended f/u pain clinic --takes Suboxone  --I have given rx for dilaudid  #15 tabs    # EtOH abuse, abstinence counseling done. -- No signs symptoms of withdrawal. Patient tells me she has not consumed in couple weeks  --Continue MVI --no s/s of WD   #E. coli UTI --UA positive --Continue ceftriaxone  IV-- change to PO now since pan sensitive   # Severe aortic stenosis s/p mechanical AVR 04/10/2022, Ascending Aortic Aneurysm s/p repair 03/2022 -- Coumadin  and monitor INR. -- Pharmacy consulted    # Chronic diastolic heart failure, HTN & HLD --cont home meds    # Vitamin D  deficiency: started vitamin D  50,000 units p.o. weekly, follow with PCP    # Hypomagnesemia # Hypophosphatemia Monitor electrolytes and replete as needed.   # Prediabetic Patient was on metformin  at home which has been held for now A1c 5.6%  # Constipation, started laxatives 11/25 C/o rectal pain possible hemorrhoids, hydrocortisone  suppositories twice daily x 2 doses  D/c home     Body mass index is 40.16 kg/m.  Family communication :none Consults :none CODE STATUS: full DVT Prophylaxis :warfarin     Pain control - [place]  Controlled Substance Reporting System database was reviewed. and patient was instructed, not to drive, operate heavy machinery, perform activities at heights, swimming or participation in water activities or provide baby-sitting services while on Pain, Sleep and Anxiety Medications; until their outpatient Physician has advised to do so again. Also recommended to not to take more than prescribed Pain, Sleep and Anxiety Medications.  Disposition: Home Diet recommendation:  Cardiac and Carb modified diet DISCHARGE  MEDICATION: Allergies as of 06/12/2024       Reactions   Latex Anaphylaxis, Hives, Itching   Other reaction(s): Other (See Comments) SKIN BURNING & PEELING   Lisinopril  Swelling   Other  Reaction(s): Angioedema   Nsaids Other (See Comments)   GI Ulcers   Tomato Anaphylaxis, Hives, Itching   Gabapentin  Palpitations   Metformin     Other Reaction(s): Abdominal Pain Causes pancreatitis to worsen   Semaglutide    Other Reaction(s): Other (See Comments)    Acute on chronic pancreatitis 09/2023   Duloxetine Anxiety   Makes her jittery   Tramadol Hives, Itching        Medication List     STOP taking these medications    metFORMIN  500 MG tablet Commonly known as: GLUCOPHAGE        TAKE these medications    albuterol  108 (90 Base) MCG/ACT inhaler Commonly known as: VENTOLIN  HFA Inhale 1-2 puffs into the lungs every 6 (six) hours as needed.   amLODipine  10 MG tablet Commonly known as: NORVASC  Take 1 tablet (10 mg total) by mouth daily.   atorvastatin  40 MG tablet Commonly known as: LIPITOR Take 40 mg by mouth daily.   buprenorphine  8 MG Subl SL tablet Commonly known as: SUBUTEX  Place 8 mg under the tongue. Place 1 tablet (8 mg total) under the tongue 3 (three) times daily   cephALEXin  500 MG capsule Commonly known as: KEFLEX  Take 1 capsule (500 mg total) by mouth every 6 (six) hours for 2 doses.   furosemide  40 MG tablet Commonly known as: LASIX  Take 0.5 tablets (20 mg total) by mouth daily as needed. Home med. What changed:  how much to take reasons to take this   HYDROmorphone  2 MG tablet Commonly known as: DILAUDID  Take 1 tablet (2 mg total) by mouth every 8 (eight) hours as needed for severe pain (pain score 7-10).   lipase/protease/amylase 63999 UNITS Cpep capsule Commonly known as: CREON  Take 1 capsule (36,000 Units total) by mouth 3 (three) times daily with meals. What changed: how much to take   losartan  50 MG tablet Commonly known as: COZAAR  Take 1 tablet (50 mg total) by mouth daily.   magnesium  chloride 64 MG Tbec SR tablet Commonly known as: SLOW-MAG Take 1 tablet (64 mg total) by mouth daily.   multivitamin with minerals  Tabs tablet Take 1 tablet by mouth daily. Start taking on: June 13, 2024   naloxone  4 MG/0.1ML Liqd nasal spray kit Commonly known as: NARCAN  Place 1 spray into the nose once as needed (to reverse overdose).   omeprazole 40 MG capsule Commonly known as: PRILOSEC Take 40 mg by mouth daily.   ondansetron  4 MG disintegrating tablet Commonly known as: ZOFRAN -ODT Take 4 mg by mouth every 8 (eight) hours as needed for nausea.   potassium chloride  20 MEQ/15ML (10%) Soln Take 20 mEq by mouth 2 (two) times daily.   spironolactone  25 MG tablet Commonly known as: ALDACTONE  Take 25 mg by mouth daily.   traZODone  50 MG tablet Commonly known as: DESYREL  Take 100 mg by mouth at bedtime.   Vitamin D  (Ergocalciferol ) 1.25 MG (50000 UNIT) Caps capsule Commonly known as: DRISDOL  Take 1 capsule (50,000 Units total) by mouth every 7 (seven) days. Start taking on: June 16, 2024   warfarin 7.5 MG tablet Commonly known as: COUMADIN  Take 0.5 tablets (3.75 mg total) by mouth daily.        Follow-up Information     [facility]inic, [facility]. Schedule  an appointment as soon as possible for a visit in 1 week(s).   Contact information: [address] [place] [address] [phone]                 Filed Weights   06/08/24 1339 06/08/24 2127  Weight: 110.7 kg 112.9 kg     Condition at discharge: fair  The results of significant diagnostics from this hospitalization (including imaging, microbiology, ancillary and laboratory) are listed below for reference.   Imaging Studies: CT ABDOMEN PELVIS W CONTRAST Result Date: 06/08/2024 EXAM: CT ABDOMEN AND PELVIS WITH CONTRAST 06/08/2024 05:40:13 PM TECHNIQUE: CT of the abdomen and pelvis was performed with the administration of 100 mL of iohexol  (OMNIPAQUE ) 300 MG/ML solution. Multiplanar reformatted images are provided for review. Automated exposure control, iterative reconstruction, and/or weight-based adjustment of  the mA/kV was utilized to reduce the radiation dose to as low as reasonably achievable. COMPARISON: 01/25/2024 CLINICAL HISTORY: abdominal pain, epigastric FINDINGS: LOWER CHEST: No acute abnormality. LIVER: The liver is unremarkable. GALLBLADDER AND BILE DUCTS: Prior cholecystectomy. No biliary ductal dilatation. SPLEEN: No acute abnormality. PANCREAS: Mild stranding around the pancreas which may reflect acute pancreatitis. No ductal dilatation. ADRENAL GLANDS: No acute abnormality. KIDNEYS, URETERS AND BLADDER: No stones in the kidneys or ureters. No hydronephrosis. No perinephric or periureteral stranding. Urinary bladder is unremarkable. GI AND BOWEL: Stomach demonstrates no acute abnormality. There is no bowel obstruction. PERITONEUM AND RETROPERITONEUM: Trace free fluid in the cul-de-sac. No free air. VASCULATURE: Aorta is normal in caliber. LYMPH NODES: No lymphadenopathy. REPRODUCTIVE ORGANS: No acute abnormality. BONES AND SOFT TISSUES: No acute osseous abnormality. No focal soft tissue abnormality. IMPRESSION: 1. Findings compatible with acute pancreatitis . 2. Trace free fluid in the cul-de-sac. Electronically signed by: Franky Crease MD 06/08/2024 05:45 PM EST RP Workstation: [id number]    Microbiology: Results for orders placed or performed during the hospital encounter of 06/08/24  Urine Culture (for pregnant, neutropenic or urologic patients or patients with an indwelling urinary catheter)     Status: Abnormal   Collection Time: 06/08/24  1:36 PM   Specimen: Urine, Clean Catch  Result Value Ref Range Status   Specimen Description   Final    URINE, CLEAN CATCH Performed at [facility], [address] [place], [place] [address]    Special Requests   Final    NONE Performed at [facility], [address], [place], [place] [address]    Culture >=100,000 COLONIES/mL ESCHERICHIA COLI (A)  Final   Report Status 06/11/2024 FINAL  Final   Organism ID, Bacteria  ESCHERICHIA COLI (A)  Final      Susceptibility   Escherichia coli - MIC*    AMPICILLIN 4 SENSITIVE Sensitive     CEFAZOLIN  (URINE) Value in next row Sensitive      <=1 SENSITIVEThis is a modified FDA-approved test that has been validated and its performance characteristics determined by the reporting laboratory.  This laboratory is certified under the Clinical Laboratory Improvement Amendments CLIA as qualified to perform high complexity clinical laboratory testing.    CEFEPIME Value in next row Sensitive      <=1 SENSITIVEThis is a modified FDA-approved test that has been validated and its performance characteristics determined by the reporting laboratory.  This laboratory is certified under the Clinical Laboratory Improvement Amendments CLIA as qualified to perform high complexity clinical laboratory testing.    ERTAPENEM Value in next row Sensitive      <=1 SENSITIVEThis is a modified  FDA-approved test that has been validated and its performance characteristics determined by the reporting laboratory.  This laboratory is certified under the Clinical Laboratory Improvement Amendments CLIA as qualified to perform high complexity clinical laboratory testing.    CEFTRIAXONE  Value in next row Sensitive      <=1 SENSITIVEThis is a modified FDA-approved test that has been validated and its performance characteristics determined by the reporting laboratory.  This laboratory is certified under the Clinical Laboratory Improvement Amendments CLIA as qualified to perform high complexity clinical laboratory testing.    CIPROFLOXACIN Value in next row Sensitive      <=1 SENSITIVEThis is a modified FDA-approved test that has been validated and its performance characteristics determined by the reporting laboratory.  This laboratory is certified under the Clinical Laboratory Improvement Amendments CLIA as qualified to perform high complexity clinical laboratory testing.    GENTAMICIN Value in next row Sensitive       <=1 SENSITIVEThis is a modified FDA-approved test that has been validated and its performance characteristics determined by the reporting laboratory.  This laboratory is certified under the Clinical Laboratory Improvement Amendments CLIA as qualified to perform high complexity clinical laboratory testing.    NITROFURANTOIN Value in next row Sensitive      <=1 SENSITIVEThis is a modified FDA-approved test that has been validated and its performance characteristics determined by the reporting laboratory.  This laboratory is certified under the Clinical Laboratory Improvement Amendments CLIA as qualified to perform high complexity clinical laboratory testing.    TRIMETH/SULFA Value in next row Sensitive      <=1 SENSITIVEThis is a modified FDA-approved test that has been validated and its performance characteristics determined by the reporting laboratory.  This laboratory is certified under the Clinical Laboratory Improvement Amendments CLIA as qualified to perform high complexity clinical laboratory testing.    AMPICILLIN/SULBACTAM Value in next row Sensitive      <=1 SENSITIVEThis is a modified FDA-approved test that has been validated and its performance characteristics determined by the reporting laboratory.  This laboratory is certified under the Clinical Laboratory Improvement Amendments CLIA as qualified to perform high complexity clinical laboratory testing.    PIP/TAZO Value in next row Sensitive      <=4 SENSITIVEThis is a modified FDA-approved test that has been validated and its performance characteristics determined by the reporting laboratory.  This laboratory is certified under the Clinical Laboratory Improvement Amendments CLIA as qualified to perform high complexity clinical laboratory testing.    MEROPENEM Value in next row Sensitive      <=4 SENSITIVEThis is a modified FDA-approved test that has been validated and its performance characteristics determined by the reporting laboratory.   This laboratory is certified under the Clinical Laboratory Improvement Amendments CLIA as qualified to perform high complexity clinical laboratory testing.    * >=100,000 COLONIES/mL ESCHERICHIA COLI    Labs: CBC: Recent Labs  Lab 06/08/24 1411 06/09/24 0834 06/10/24 0643 06/11/24 0335  WBC 6.1 6.2 3.8* 3.3*  HGB 12.8 12.2 12.6 13.6  HCT 38.4 36.5 37.1 40.0  MCV 93.9 96.6 94.6 93.7  PLT 251 196 194 207   Basic Metabolic Panel: Recent Labs  Lab 06/08/24 1411 06/09/24 0834 06/10/24 0643 06/11/24 0335 06/12/24 0344  NA 136 136 138 137 136  K 3.4* 3.8 3.4* 3.3* 3.5  CL 98 101 103 100 102  CO2 21* 21* 25 26 25   GLUCOSE 114* 66* 135* 122* 110*  BUN 7 7 <5* <5* <5*  CREATININE 0.75 0.67 0.72 0.65  0.63  CALCIUM  8.6* 8.6* 9.1 9.4 9.2  MG 1.0* 1.6* 1.6* 1.6* 1.7  PHOS  --  2.3* 2.1* 2.5 2.3*   Liver Function Tests: Recent Labs  Lab 06/08/24 1411 06/09/24 0834 06/10/24 0643 06/11/24 0335  AST 68* 350* 103* 43*  ALT 18 79* 56* 42  ALKPHOS 110 98 95 94  BILITOT 0.7 0.7 0.4 0.5  PROT 7.4 6.5 6.6 7.2  ALBUMIN 4.1 3.7 3.6 3.9   CBG: Recent Labs  Lab 06/10/24 2153 06/11/24 0813 06/11/24 1140 06/11/24 1626 06/11/24 2117  GLUCAP 123* 128* 123* 133* 109*    Discharge time spent: greater than 30 minutes.  Signed: Leita Blanch, MD [facility] 06/12/2024

## 2024-06-12 NOTE — Progress Notes (Signed)
 PHARMACY CONSULT NOTE - FOLLOW UP  Pharmacy Consult for Electrolyte Monitoring and Replacement   Recent Labs: Potassium (mmol/L)  Date Value  06/12/2024 3.5   Magnesium  (mg/dL)  Date Value  88/72/7974 1.7   Calcium  (mg/dL)  Date Value  88/72/7974 9.2   Albumin (g/dL)  Date Value  88/73/7974 3.9   Phosphorus (mg/dL)  Date Value  88/72/7974 2.3 (L)   Sodium (mmol/L)  Date Value  06/12/2024 136     Assessment: 43 y.o. female with medical history significant of alcohol abuse, history of pancreatitis, GERD, asthma, chronic pain syndrome, essential hypertension, history of pelvic inflammatory disease, recurrent UTI who presented to the ER with suspicion of UTI. Pharmacy is asked to follow and replace electrolytes  Goal of Therapy:  Electrolytes WNL  Plan:  ---2g IV magnesium  sulfate x 1 --- Kphos 500mg  PO x 2 doses  ---recheck electrolytes in am  Rune Mendez A Pennye Beeghly, PharmD, BCPS Clinical Pharmacist 06/12/2024 7:40 AM

## 2024-06-12 NOTE — Progress Notes (Signed)
 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  Allergies  Allergen Reactions   Latex Anaphylaxis, Hives and Itching    Other reaction(s): Other (See Comments) SKIN BURNING & PEELING    Lisinopril  Swelling    Other Reaction(s): Angioedema   Nsaids Other (See Comments)    GI Ulcers   Tomato Anaphylaxis, Hives and Itching   Gabapentin  Palpitations   Metformin      Other Reaction(s): Abdominal Pain  Causes pancreatitis to worsen   Semaglutide     Other Reaction(s): Other (See Comments)    Acute on chronic pancreatitis 09/2023   Duloxetine Anxiety    Makes her jittery   Tramadol Hives and Itching    Patient Measurements: Height: 5' 6 (167.6 cm) Weight: 112.9 kg (248 lb 12.8 oz) IBW/kg (Calculated) : 59.3 HEPARIN  DW (KG): 85.7  Vital Signs: Temp: 97.5 F (36.4 C) (11/27 0408) BP: 146/69 (11/27 0459) Pulse Rate: 51 (11/27 0459)  Labs: Recent Labs    06/09/24 0834 06/10/24 0643 06/11/24 0335 06/12/24 0344  HGB 12.2 12.6 13.6  --   HCT 36.5 37.1 40.0  --   PLT 196 194 207  --   LABPROT 25.3* 21.7* 20.4* 20.0*  INR 2.2* 1.8* 1.7* 1.6*  CREATININE 0.67 0.72 0.65 0.63    Estimated Creatinine Clearance: 116.7 mL/min (by C-G formula based on SCr of 0.63 mg/dL).   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  Pt was on warfarin 3.75 mg PO  daily PTA,  last dose unknown.   Because of On-X valve , her goal INR is 1.5 - 2.0 (plus daily aspirin ).    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

## 2024-06-12 NOTE — Plan of Care (Signed)

## 2024-06-12 NOTE — Plan of Care (Signed)

## 2024-07-12 ENCOUNTER — Emergency Department: Payer: MEDICAID

## 2024-07-12 ENCOUNTER — Emergency Department
Admission: EM | Admit: 2024-07-12 | Discharge: 2024-07-12 | Disposition: A | Discharge status: Home / Self Care | Payer: MEDICAID | Attending: Emergency Medicine | Admitting: Emergency Medicine

## 2024-07-12 ENCOUNTER — Other Ambulatory Visit: Payer: Self-pay

## 2024-07-12 DIAGNOSIS — I10 Essential (primary) hypertension: Secondary | ICD-10-CM | POA: Diagnosis not present

## 2024-07-12 DIAGNOSIS — K861 Other chronic pancreatitis: Secondary | ICD-10-CM | POA: Insufficient documentation

## 2024-07-12 DIAGNOSIS — R002 Palpitations: Secondary | ICD-10-CM

## 2024-07-12 DIAGNOSIS — R109 Unspecified abdominal pain: Secondary | ICD-10-CM | POA: Diagnosis present

## 2024-07-12 DIAGNOSIS — F419 Anxiety disorder, unspecified: Secondary | ICD-10-CM | POA: Insufficient documentation

## 2024-07-12 LAB — BASIC METABOLIC PANEL WITH GFR
Anion gap: 20 — ABNORMAL HIGH (ref 5–15)
BUN: 6 mg/dL (ref 6–20)
CO2: 23 mmol/L (ref 22–32)
Calcium: 9.4 mg/dL (ref 8.9–10.3)
Chloride: 97 mmol/L — ABNORMAL LOW (ref 98–111)
Creatinine, Ser: 0.75 mg/dL (ref 0.44–1.00)
GFR, Estimated: >60 mL/min
Glucose, Bld: 118 mg/dL — ABNORMAL HIGH (ref 70–99)
Potassium: 3.2 mmol/L — ABNORMAL LOW (ref 3.5–5.1)
Sodium: 141 mmol/L (ref 135–145)

## 2024-07-12 LAB — HEPATIC FUNCTION PANEL
ALT: 14 U/L (ref 0–44)
AST: 25 U/L (ref 15–41)
Albumin: 3.9 g/dL (ref 3.5–5.0)
Alkaline Phosphatase: 82 U/L (ref 38–126)
Bilirubin, Direct: 0.2 mg/dL (ref 0.0–0.2)
Indirect Bilirubin: 0.2 mg/dL — ABNORMAL LOW (ref 0.3–0.9)
Total Bilirubin: 0.5 mg/dL (ref 0.0–1.2)
Total Protein: 6.7 g/dL (ref 6.5–8.1)

## 2024-07-12 LAB — CBC
HCT: 44.1 % (ref 36.0–46.0)
Hemoglobin: 14.6 g/dL (ref 12.0–15.0)
MCH: 31.6 pg (ref 26.0–34.0)
MCHC: 33.1 g/dL (ref 30.0–36.0)
MCV: 95.5 fL (ref 80.0–100.0)
Platelets: 276 K/uL (ref 150–400)
RBC: 4.62 MIL/uL (ref 3.87–5.11)
RDW: 13.4 % (ref 11.5–15.5)
WBC: 8.8 K/uL (ref 4.0–10.5)
nRBC: 0 % (ref 0.0–0.2)

## 2024-07-12 LAB — TROPONIN T, HIGH SENSITIVITY
Troponin T High Sensitivity: <15 ng/L (ref 0–19)
Troponin T High Sensitivity: <15 ng/L (ref 0–19)

## 2024-07-12 LAB — LIPASE, BLOOD: Lipase: 34 U/L (ref 11–51)

## 2024-07-12 MED ORDER — LACTATED RINGERS IV BOLUS
1000.0000 mL | Freq: Once | INTRAVENOUS | Status: AC
  Administered 2024-07-12: 1000 mL via INTRAVENOUS

## 2024-07-12 MED ORDER — HYDROMORPHONE HCL 1 MG/ML IJ SOLN
1.0000 mg | Freq: Once | INTRAMUSCULAR | Status: AC
  Administered 2024-07-12: 1 mg via INTRAVENOUS
  Filled 2024-07-12: qty 1

## 2024-07-12 MED ORDER — LORAZEPAM 2 MG PO TABS
2.0000 mg | ORAL_TABLET | Freq: Once | ORAL | Status: DC
  Filled 2024-07-12: qty 1

## 2024-07-12 NOTE — Discharge Instructions (Signed)
 Please follow-up with your doctor within the next several days for recheck/reevaluation.  Return to the emergency department for any worsening pain or any other symptom personally concerning to yourself.

## 2024-07-12 NOTE — ED Notes (Signed)
 IV team at bedside

## 2024-07-12 NOTE — ED Provider Notes (Signed)
 "  [facility] Provider Note    Event Date/Time   First MD Initiated Contact with Patient 07/12/24 1643     (approximate)  History   Chief Complaint: Chest Pain  HPI  Sarah Sosa is a 43 y.o. female with a past medical history of chronic pain on oxycodone  at home, hypertension, chronic pancreatitis, presents to the emergency department for worsening abdominal pain.  According to the patient for the last 3 to 4 days she has had worsening abdominal pain consistent with a flare of pancreatitis.  Patient states her daughter was giving birth and she did not want to leave her daughter so she has been dealing with the pain at home.  She states today she felt like her heart was skipping beats and she became shaky and very concerned/came to the emergency department.  Here the patient is hyperventilating appears very anxious, states she has had panic attacks before that this feels different.  No pleuritic chest pain.  Patient states pain in the upper abdomen consistent with her chronic pancreatitis.  Physical Exam   Triage Vital Signs: ED Triage Vitals  Encounter Vitals Group     BP 07/12/24 1623 (!) 156/97     Girls Systolic BP Percentile --      Girls Diastolic BP Percentile --      Boys Systolic BP Percentile --      Boys Diastolic BP Percentile --      Pulse Rate 07/12/24 1623 (!) 129     Resp 07/12/24 1623 (!) 22     Temp 07/12/24 1623 98.3 F (36.8 C)     Temp Source 07/12/24 1623 Oral     SpO2 07/12/24 1622 98 %     Weight 07/12/24 1624 248 lb 14.4 oz (112.9 kg)     Height --      Head Circumference --      Peak Flow --      Pain Score 07/12/24 1651 9     Pain Loc --      Pain Education --      Exclude from Growth Chart --     Most recent vital signs: Vitals:   07/12/24 1633 07/12/24 1647  BP: (!) 144/95   Pulse: 100   Resp: (!) 30   Temp:    SpO2: 100% 100%    General: Awake, moderately anxious appearing. CV:  Good peripheral perfusion.   Regular rate and rhythm  Resp:  Normal effort.  Equal breath sounds bilaterally.  Abd:  No distention.  Soft, moderate epigastric tenderness.  No rebound or guarding.  ED Results / Procedures / Treatments   EKG  EKG viewed and interpreted by myself shows sinus tachycardia 124 bpm with a narrow QRS, normal axis, normal intervals, no concerning ST changes.  RADIOLOGY  I have reviewed interpret the chest x-ray images.  No consolidation on my evaluation.  Sternotomy wires present.   MEDICATIONS ORDERED IN ED: Medications  LORazepam  (ATIVAN ) tablet 2 mg (has no administration in time range)  lactated ringers  bolus 1,000 mL (has no administration in time range)     IMPRESSION / MDM / ASSESSMENT AND PLAN / ED COURSE  I reviewed the triage vital signs and the nursing notes.  Patient's presentation is most consistent with acute presentation with potential threat to life or bodily function.  Patient presents emergency department for upper abdominal pain and heart palpitations today now very anxious appearing during my evaluation.  Patient remains tachypneic hyperventilating states that  she feels a tingling sensation in both of her hands.  We will dose Ativan  and reassess.  We will check labs we will continue to closely monitor.  Patient has a difficult IV stick we will dose oral Ativan  while awaiting IV access.  Patient's labs have begun to result showing a reassuring CBC, chemistry shows an anion gap of 20, normal blood glucose normal bicarb.  I have added on LFTs a troponin and lipase.  Lipase is normal however given the patient's chronic pancreatitis this does not necessarily rule out pancreatitis flare.  Patient is feeling much better after medication.  Patient has received fluids.  Will discharge patient home with outpatient follow-up.  Discussed with patient to continue plenty of oral hydration.  Patient agreeable to plan of care.  FINAL CLINICAL IMPRESSION(S) / ED DIAGNOSES    Anxiety Abdominal pain Chronic pancreatitis  Note:  This document was prepared using Dragon voice recognition software and may include unintentional dictation errors.   Dorothyann Drivers, MD 07/12/24 2010  "

## 2024-07-12 NOTE — ED Triage Notes (Addendum)
 ARrives anxious, hyperventilating. C/O chest pain, chest heaviness.  Onset just PTA. States felt heart stop beating and then took a deep breath to help.  States feels like she is having a pancreatic attack x past several days.  AAOx3.  Skin warm and dry. NAD

## 2024-07-26 ENCOUNTER — Emergency Department
Admission: EM | Admit: 2024-07-26 | Discharge: 2024-07-26 | Disposition: A | Discharge status: Home / Self Care | Payer: MEDICAID

## 2024-07-26 ENCOUNTER — Emergency Department: Payer: MEDICAID

## 2024-07-26 ENCOUNTER — Other Ambulatory Visit: Payer: Self-pay

## 2024-07-26 DIAGNOSIS — J069 Acute upper respiratory infection, unspecified: Secondary | ICD-10-CM | POA: Insufficient documentation

## 2024-07-26 DIAGNOSIS — E119 Type 2 diabetes mellitus without complications: Secondary | ICD-10-CM | POA: Diagnosis not present

## 2024-07-26 DIAGNOSIS — R61 Generalized hyperhidrosis: Secondary | ICD-10-CM | POA: Insufficient documentation

## 2024-07-26 DIAGNOSIS — Z5321 Procedure and treatment not carried out due to patient leaving prior to being seen by health care provider: Secondary | ICD-10-CM | POA: Diagnosis not present

## 2024-07-26 DIAGNOSIS — F17200 Nicotine dependence, unspecified, uncomplicated: Secondary | ICD-10-CM | POA: Insufficient documentation

## 2024-07-26 DIAGNOSIS — I509 Heart failure, unspecified: Secondary | ICD-10-CM | POA: Diagnosis not present

## 2024-07-26 DIAGNOSIS — R059 Cough, unspecified: Secondary | ICD-10-CM | POA: Diagnosis present

## 2024-07-26 DIAGNOSIS — R0789 Other chest pain: Secondary | ICD-10-CM | POA: Diagnosis present

## 2024-07-26 HISTORY — DX: Type 2 diabetes mellitus without complications: E11.9

## 2024-07-26 HISTORY — DX: Heart failure, unspecified: I50.9

## 2024-07-26 LAB — BASIC METABOLIC PANEL WITH GFR
Anion gap: 12 (ref 5–15)
BUN: 6 mg/dL (ref 6–20)
CO2: 24 mmol/L (ref 22–32)
Calcium: 9.5 mg/dL (ref 8.9–10.3)
Chloride: 102 mmol/L (ref 98–111)
Creatinine, Ser: 0.81 mg/dL (ref 0.44–1.00)
GFR, Estimated: >60 mL/min
Glucose, Bld: 90 mg/dL (ref 70–99)
Potassium: 4.8 mmol/L (ref 3.5–5.1)
Sodium: 137 mmol/L (ref 135–145)

## 2024-07-26 LAB — CBC
HCT: 38.7 % (ref 36.0–46.0)
Hemoglobin: 12.9 g/dL (ref 12.0–15.0)
MCH: 32.3 pg (ref 26.0–34.0)
MCHC: 33.3 g/dL (ref 30.0–36.0)
MCV: 97 fL (ref 80.0–100.0)
Platelets: 230 K/uL (ref 150–400)
RBC: 3.99 MIL/uL (ref 3.87–5.11)
RDW: 13.2 % (ref 11.5–15.5)
WBC: 5 K/uL (ref 4.0–10.5)
nRBC: 0 % (ref 0.0–0.2)

## 2024-07-26 LAB — RESP PANEL BY RT-PCR (RSV, FLU A&B, COVID)  RVPGX2
Influenza A by PCR: NEGATIVE
Influenza B by PCR: NEGATIVE
Resp Syncytial Virus by PCR: NEGATIVE
SARS Coronavirus 2 by RT PCR: NEGATIVE

## 2024-07-26 LAB — TROPONIN T, HIGH SENSITIVITY: Troponin T High Sensitivity: <15 ng/L (ref 0–19)

## 2024-07-26 MED ORDER — ACETAMINOPHEN 325 MG PO TABS
650.0000 mg | ORAL_TABLET | Freq: Once | ORAL | Status: AC
  Administered 2024-07-26: 650 mg via ORAL
  Filled 2024-07-26: qty 2

## 2024-07-26 NOTE — Discharge Instructions (Addendum)
 Your chest x-ray, flu swab, and labs are reassuring.  Please follow-up with your outpatient provider.  Please return for any new, worsening, or changing symptoms or other concerns.  It was a pleasure caring for you today.

## 2024-07-26 NOTE — ED Notes (Signed)
Pt stepped outside to smoke

## 2024-07-26 NOTE — ED Notes (Signed)
Lab at bedside collecting blood work.

## 2024-07-26 NOTE — ED Triage Notes (Incomplete)
 First nurse note: Pt to ED via [facility] from home. 8/10 centralized CP. Hx of etoh induced pancreatitis. 324mg  ASA PTA.   160/87 84 HR  99% RA

## 2024-07-26 NOTE — ED Notes (Signed)
 Pt refusing blood work

## 2024-07-26 NOTE — ED Provider Notes (Signed)
 "  [facility] Provider Note    Event Date/Time   First MD Initiated Contact with Patient 07/26/24 1450     (approximate)   History   Chest Pain and Cough   HPI  Sarah Sosa is a 44 y.o. female with a past medical history of chronic pancreatitis, opiate use disorder, type 2 diabetes, heart failure, aortic valve replacement on blood thinners who presents today for evaluation of cough.  Patient reports that she has had cough and fever for the past 2 days.  She does not feel short of breath.  She denies chest pain unless she has coughing.  She has not had any dyspnea on exertion.  She does not have any symptoms currently.  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   Adnexal cyst 09/15/2023   Chest pain 09/15/2023   Chronic diastolic CHF (congestive heart failure) (HCC) 09/15/2023   DM2 (diabetes mellitus, type 2) (HCC) 09/15/2023   Acute pain 09/15/2023   Elevated troponin 07/13/2023   Subtherapeutic international normalized ratio (INR) 07/13/2023   Chronic pancreatitis (HCC) 07/13/2023   S/p aortic valve replacement with 'On-X valve (INR target of 1.5 to 2) and ascending aortic graft ([facility], 03/2022) 07/13/2023   CAP (community acquired pneumonia) 07/13/2023   Opioid overdose, accidental or unintentional, initial encounter (HCC) 03/20/2023   Vomiting 03/20/2023   Obesity, Class III, BMI 40-49.9 (morbid obesity) (HCC) 03/20/2023   History of pancreatitis 03/20/2023   Acute respiratory failure with hypoxia  (HCC) 03/20/2023   Chronic use of opiate drug for therapeutic purpose 06/02/2022   S/P ascending aortic replacement 04/12/2022   Hypomagnesemia 11/23/2021   Essential hypertension 11/20/2021   GERD (gastroesophageal reflux disease) 11/20/2021   (HFpEF) heart failure with preserved ejection fraction (HCC) 11/20/2021   Peripheral neuropathy 11/20/2021   Asthma, chronic 11/20/2021   Acute alcoholic pancreatitis 11/20/2021   Alcohol abuse 11/20/2021   Acute pancreatitis 11/20/2021   History of heavy alcohol consumption 05/22/2021   Recurrent pancreatitis 06/04/2020   Intractable nausea and vomiting    Marijuana use    Hypertensive urgency 04/03/2020   Hypokalemia 04/03/2020   Tobacco abuse 04/03/2020   History of substance abuse (HCC) 04/03/2020   Ileus (HCC) 04/21/2018   Postoperative ileus (HCC) 04/20/2018   Postoperative state 04/15/2018   Uterine leiomyoma 03/19/2018   Menorrhagia with regular cycle 03/19/2018   Chronic pain disorder 03/19/2018   Pelvic pain 03/19/2018   Primary hypertension 03/19/2018          Physical Exam   Triage Vital Signs: ED Triage Vitals  Encounter Vitals Group     BP 07/26/24 1325 (!) 148/82     Girls Systolic BP Percentile --      Girls Diastolic BP Percentile --      Boys Systolic BP Percentile --      Boys Diastolic BP Percentile --      Pulse Rate 07/26/24 1325 82     Resp 07/26/24 1325 20     Temp 07/26/24 1325 98.6 F (37 C)     Temp Source 07/26/24 1325 Oral  SpO2 07/26/24 1325 98 %     Weight 07/26/24 1327 249 lb 1.9 oz (113 kg)     Height 07/26/24 1327 5' 4 (1.626 m)     Head Circumference --      Peak Flow --      Pain Score 07/26/24 1326 8     Pain Loc --      Pain Education --      Exclude from Growth Chart --     Most recent vital signs: Vitals:   07/26/24 1607 07/26/24 1712  BP: (!) 147/82 (!) 144/59  Pulse: 61 60  Resp: 16 16  Temp: 98.4 F (36.9 C) 98.6 F (37 C)  SpO2: 98% 97%    Physical  Exam Vitals and nursing note reviewed.  Constitutional:      General: Awake and alert. No acute distress.    Appearance: Normal appearance. The patient is normal weight.  HENT:     Head: Normocephalic and atraumatic.     Mouth: Mucous membranes are moist.  Eyes:     General: PERRL. Normal EOMs        Right eye: No discharge.        Left eye: No discharge.     Conjunctiva/sclera: Conjunctivae normal.  Cardiovascular:     Rate and Rhythm: Normal rate and regular rhythm.     Pulses: Normal pulses.  No JVD Pulmonary:     Effort: Pulmonary effort is normal. No respiratory distress.  No accessory muscle use, able to speak easily in complete sentences    Breath sounds: Normal breath sounds.  Abdominal:     Abdomen is soft. There is no abdominal tenderness. No rebound or guarding. No distention. Musculoskeletal:        General: No swelling. Normal range of motion.     Cervical back: Normal range of motion and neck supple.  No calf pain or leg swelling Skin:    General: Skin is warm and dry.     Capillary Refill: Capillary refill takes less than 2 seconds.     Findings: No rash.  Neurological:     Mental Status: The patient is awake and alert.      ED Results / Procedures / Treatments   Labs (all labs ordered are listed, but only abnormal results are displayed) Labs Reviewed  RESP PANEL BY RT-PCR (RSV, FLU A&B, COVID)  [id number]  BASIC METABOLIC PANEL WITH GFR  CBC  TROPONIN T, HIGH SENSITIVITY  TROPONIN T, HIGH SENSITIVITY     EKG     RADIOLOGY I independently reviewed and interpreted imaging and agree with radiologists findings.     PROCEDURES:  Critical Care performed:   Procedures   MEDICATIONS ORDERED IN ED: Medications  acetaminophen  (TYLENOL ) tablet 650 mg (650 mg Oral Given 07/26/24 1609)     IMPRESSION / MDM / ASSESSMENT AND PLAN / ED COURSE  I reviewed the triage vital signs and the nursing notes.   Differential diagnosis includes, but is not  limited to, influenza, COVID-19, bronchitis, pneumonia, other viral URI.  Patient is awake and alert, hemodynamically stable and afebrile.  She is nontoxic in appearance.  Her lungs appear to auscultation bilaterally, though she does have a dry cough on exam as well as nasal congestion.  Further workup is indicated.  Labs obtained are overall quite reassuring, no leukocytosis, normal electrolytes, normal creatinine, and negative troponin.  No need for repeat troponin given that her symptoms have been ongoing for a couple of days.  She does not have any chest pain currently.  Her chest x-ray does not reveal any evidence of cardiopulmonary abnormality and her COVID/flu/RSV swab is negative.  She does not appear to be volume overloaded, no no pitting edema, no rales, no JVD, no orthopnea, and no dyspnea on exertion.  No tachycardia or hypoxia, no pleurisy, no hemoptysis, no exogenous hormone use or recent hospitalizations, no clinical signs or symptoms of DVT, and I do not suspect pulmonary embolism today.  Symptoms of cough, nasal congestion are consistent with viral etiology especially with known sick contacts.  We discussed return precautions and outpatient follow-up.  Patient understands and agrees with plan.  Discharged in stable condition.   Patient's presentation is most consistent with acute presentation with potential threat to life or bodily function.     FINAL CLINICAL IMPRESSION(S) / ED DIAGNOSES   Final diagnoses:  Viral URI with cough     Rx / DC Orders   ED Discharge Orders     None        Note:  This document was prepared using Dragon voice recognition software and may include unintentional dictation errors.   Isa Hitz E, PA-C 07/26/24 1743    Mulvihill, Caitlin M, MD 07/26/24 1924  "

## 2024-07-26 NOTE — ED Notes (Signed)
 Pt stating she is going to leave because her father is in the lobby. This RN strongly recommended to stay and get bloodwork and explained importance of troponins. Discussed with pt for several minutes. Pt decided to leave.

## 2024-07-26 NOTE — ED Notes (Signed)
 Lab is sending another phlebotomist to get pt blood.

## 2024-07-26 NOTE — ED Triage Notes (Signed)
 Pt was just discharged from ED, seen today for cough and when she left the lobby she began feeling chest tightness (less than 20 minutes ago). Pt was diaphoretic this time on arrival, less so now. Current smoker. ED tech brought EKG immediately and showed to EDP. Respirations are unlabored.

## 2024-07-26 NOTE — ED Triage Notes (Signed)
 Pt to ED from home [facility] for cough since 2 days, 101 temp today, and chest heaviness today and pain with coughing. Sounds hoarse, states this happens when she coughs a lot. Denies sore throat.  EMS gave 1 NTG spray en route. Respirations unlabored, skin dry. Hx chronic pancreatitis.

## 2024-07-26 NOTE — ED Notes (Signed)
 Lab came and drew labs.

## 2024-08-11 ENCOUNTER — Other Ambulatory Visit: Payer: Self-pay

## 2024-08-11 ENCOUNTER — Inpatient Hospital Stay
Admission: EM | Admit: 2024-08-11 | Discharge: 2024-08-14 | DRG: 439 | Disposition: A | Discharge status: Home / Self Care | Payer: MEDICAID | Attending: Internal Medicine | Admitting: Internal Medicine

## 2024-08-11 DIAGNOSIS — F1020 Alcohol dependence, uncomplicated: Secondary | ICD-10-CM | POA: Diagnosis present

## 2024-08-11 DIAGNOSIS — K859 Acute pancreatitis without necrosis or infection, unspecified: Secondary | ICD-10-CM | POA: Diagnosis not present

## 2024-08-11 DIAGNOSIS — Z7901 Long term (current) use of anticoagulants: Secondary | ICD-10-CM | POA: Diagnosis not present

## 2024-08-11 DIAGNOSIS — R000 Tachycardia, unspecified: Secondary | ICD-10-CM | POA: Diagnosis present

## 2024-08-11 DIAGNOSIS — I5032 Chronic diastolic (congestive) heart failure: Secondary | ICD-10-CM | POA: Diagnosis present

## 2024-08-11 DIAGNOSIS — Z8619 Personal history of other infectious and parasitic diseases: Secondary | ICD-10-CM | POA: Diagnosis not present

## 2024-08-11 DIAGNOSIS — Z79899 Other long term (current) drug therapy: Secondary | ICD-10-CM

## 2024-08-11 DIAGNOSIS — E119 Type 2 diabetes mellitus without complications: Secondary | ICD-10-CM | POA: Diagnosis present

## 2024-08-11 DIAGNOSIS — J4489 Other specified chronic obstructive pulmonary disease: Secondary | ICD-10-CM | POA: Diagnosis present

## 2024-08-11 DIAGNOSIS — Z885 Allergy status to narcotic agent status: Secondary | ICD-10-CM

## 2024-08-11 DIAGNOSIS — F419 Anxiety disorder, unspecified: Secondary | ICD-10-CM | POA: Diagnosis present

## 2024-08-11 DIAGNOSIS — Z8679 Personal history of other diseases of the circulatory system: Secondary | ICD-10-CM | POA: Diagnosis not present

## 2024-08-11 DIAGNOSIS — K861 Other chronic pancreatitis: Secondary | ICD-10-CM

## 2024-08-11 DIAGNOSIS — K860 Alcohol-induced chronic pancreatitis: Secondary | ICD-10-CM | POA: Diagnosis present

## 2024-08-11 DIAGNOSIS — Z8261 Family history of arthritis: Secondary | ICD-10-CM | POA: Diagnosis not present

## 2024-08-11 DIAGNOSIS — M1990 Unspecified osteoarthritis, unspecified site: Secondary | ICD-10-CM | POA: Diagnosis present

## 2024-08-11 DIAGNOSIS — Z90710 Acquired absence of both cervix and uterus: Secondary | ICD-10-CM | POA: Diagnosis not present

## 2024-08-11 DIAGNOSIS — E785 Hyperlipidemia, unspecified: Secondary | ICD-10-CM | POA: Diagnosis present

## 2024-08-11 DIAGNOSIS — Z8249 Family history of ischemic heart disease and other diseases of the circulatory system: Secondary | ICD-10-CM

## 2024-08-11 DIAGNOSIS — E66813 Obesity, class 3: Secondary | ICD-10-CM | POA: Diagnosis present

## 2024-08-11 DIAGNOSIS — F17210 Nicotine dependence, cigarettes, uncomplicated: Secondary | ICD-10-CM | POA: Diagnosis present

## 2024-08-11 DIAGNOSIS — Z6841 Body Mass Index (BMI) 40.0 and over, adult: Secondary | ICD-10-CM

## 2024-08-11 DIAGNOSIS — R1012 Left upper quadrant pain: Secondary | ICD-10-CM | POA: Diagnosis present

## 2024-08-11 DIAGNOSIS — Z954 Presence of other heart-valve replacement: Secondary | ICD-10-CM

## 2024-08-11 DIAGNOSIS — I110 Hypertensive heart disease with heart failure: Secondary | ICD-10-CM | POA: Diagnosis present

## 2024-08-11 DIAGNOSIS — Z9049 Acquired absence of other specified parts of digestive tract: Secondary | ICD-10-CM

## 2024-08-11 DIAGNOSIS — Z888 Allergy status to other drugs, medicaments and biological substances status: Secondary | ICD-10-CM

## 2024-08-11 DIAGNOSIS — Z952 Presence of prosthetic heart valve: Secondary | ICD-10-CM | POA: Diagnosis not present

## 2024-08-11 DIAGNOSIS — G894 Chronic pain syndrome: Secondary | ICD-10-CM | POA: Diagnosis present

## 2024-08-11 DIAGNOSIS — Z91040 Latex allergy status: Secondary | ICD-10-CM

## 2024-08-11 DIAGNOSIS — K852 Alcohol induced acute pancreatitis without necrosis or infection: Secondary | ICD-10-CM | POA: Diagnosis present

## 2024-08-11 DIAGNOSIS — K219 Gastro-esophageal reflux disease without esophagitis: Secondary | ICD-10-CM | POA: Diagnosis present

## 2024-08-11 DIAGNOSIS — J449 Chronic obstructive pulmonary disease, unspecified: Secondary | ICD-10-CM | POA: Insufficient documentation

## 2024-08-11 DIAGNOSIS — Z886 Allergy status to analgesic agent status: Secondary | ICD-10-CM

## 2024-08-11 DIAGNOSIS — I5030 Unspecified diastolic (congestive) heart failure: Secondary | ICD-10-CM | POA: Diagnosis present

## 2024-08-11 DIAGNOSIS — Z91018 Allergy to other foods: Secondary | ICD-10-CM

## 2024-08-11 LAB — COMPREHENSIVE METABOLIC PANEL WITH GFR
ALT: 16 U/L (ref 0–44)
AST: 51 U/L — ABNORMAL HIGH (ref 15–41)
Albumin: 4.3 g/dL (ref 3.5–5.0)
Alkaline Phosphatase: 89 U/L (ref 38–126)
Anion gap: 20 — ABNORMAL HIGH (ref 5–15)
BUN: 9 mg/dL (ref 6–20)
CO2: 22 mmol/L (ref 22–32)
Calcium: 9.2 mg/dL (ref 8.9–10.3)
Chloride: 96 mmol/L — ABNORMAL LOW (ref 98–111)
Creatinine, Ser: 0.81 mg/dL (ref 0.44–1.00)
GFR, Estimated: >60 mL/min
Glucose, Bld: 150 mg/dL — ABNORMAL HIGH (ref 70–99)
Potassium: 3.8 mmol/L (ref 3.5–5.1)
Sodium: 137 mmol/L (ref 135–145)
Total Bilirubin: 1.1 mg/dL (ref 0.0–1.2)
Total Protein: 7.8 g/dL (ref 6.5–8.1)

## 2024-08-11 LAB — CBC
HCT: 40 % (ref 36.0–46.0)
Hemoglobin: 13.4 g/dL (ref 12.0–15.0)
MCH: 32.1 pg (ref 26.0–34.0)
MCHC: 33.5 g/dL (ref 30.0–36.0)
MCV: 95.9 fL (ref 80.0–100.0)
Platelets: 235 10*3/uL (ref 150–400)
RBC: 4.17 MIL/uL (ref 3.87–5.11)
RDW: 13.1 % (ref 11.5–15.5)
WBC: 5.9 10*3/uL (ref 4.0–10.5)
nRBC: 0 % (ref 0.0–0.2)

## 2024-08-11 LAB — BASIC METABOLIC PANEL WITH GFR
Anion gap: 13 (ref 5–15)
BUN: 8 mg/dL (ref 6–20)
CO2: 25 mmol/L (ref 22–32)
Calcium: 8.2 mg/dL — ABNORMAL LOW (ref 8.9–10.3)
Chloride: 99 mmol/L (ref 98–111)
Creatinine, Ser: 0.7 mg/dL (ref 0.44–1.00)
GFR, Estimated: >60 mL/min
Glucose, Bld: 99 mg/dL (ref 70–99)
Potassium: 4.6 mmol/L (ref 3.5–5.1)
Sodium: 136 mmol/L (ref 135–145)

## 2024-08-11 LAB — PROTIME-INR
INR: 2.1 — ABNORMAL HIGH (ref 0.8–1.2)
Prothrombin Time: 24.6 s — ABNORMAL HIGH (ref 11.4–15.2)

## 2024-08-11 LAB — TROPONIN T, HIGH SENSITIVITY: Troponin T High Sensitivity: 12 ng/L (ref 0–19)

## 2024-08-11 LAB — ETHANOL: Alcohol, Ethyl (B): <15 mg/dL

## 2024-08-11 LAB — TRIGLYCERIDES: Triglycerides: 77 mg/dL

## 2024-08-11 LAB — MAGNESIUM
Magnesium: 0.8 mg/dL — CL (ref 1.7–2.4)
Magnesium: 2 mg/dL (ref 1.7–2.4)

## 2024-08-11 LAB — LIPASE, BLOOD: Lipase: 150 U/L — ABNORMAL HIGH (ref 11–51)

## 2024-08-11 LAB — PHOSPHORUS: Phosphorus: 2.2 mg/dL — ABNORMAL LOW (ref 2.5–4.6)

## 2024-08-11 MED ORDER — WARFARIN SODIUM 2.5 MG PO TABS: 3.7500 mg | ORAL_TABLET | Freq: Every day | ORAL | Status: DC

## 2024-08-11 MED ORDER — POTASSIUM CHLORIDE IN NACL 20-0.9 MEQ/L-% IV SOLN
INTRAVENOUS | Status: DC
  Filled 2024-08-11 (×2): qty 1000

## 2024-08-11 MED ORDER — SPIRONOLACTONE 25 MG PO TABS: 25.0000 mg | ORAL_TABLET | Freq: Every day | ORAL | Status: DC

## 2024-08-11 MED ORDER — HYDROMORPHONE HCL 1 MG/ML IJ SOLN
1.0000 mg | Freq: Once | INTRAMUSCULAR | Status: AC
  Administered 2024-08-11: 1 mg via INTRAVENOUS
  Filled 2024-08-11: qty 1

## 2024-08-11 MED ORDER — BUPRENORPHINE HCL 2 MG SL SUBL
8.0000 mg | SUBLINGUAL_TABLET | Freq: Three times a day (TID) | SUBLINGUAL | Status: DC
  Filled 2024-08-11 (×4): qty 4

## 2024-08-11 MED ORDER — ALBUTEROL SULFATE HFA 108 (90 BASE) MCG/ACT IN AERS: 1.0000 | INHALATION_SPRAY | Freq: Four times a day (QID) | RESPIRATORY_TRACT | Status: DC | PRN

## 2024-08-11 MED ORDER — MAGNESIUM SULFATE 2 GM/50ML IV SOLN
2.0000 g | Freq: Once | INTRAVENOUS | Status: AC
  Administered 2024-08-11: 2 g via INTRAVENOUS
  Filled 2024-08-11: qty 50

## 2024-08-11 MED ORDER — SODIUM CHLORIDE 0.9 % IV SOLN: INTRAVENOUS | Status: DC

## 2024-08-11 MED ORDER — THIAMINE MONONITRATE 100 MG PO TABS
100.0000 mg | ORAL_TABLET | Freq: Every day | ORAL | Status: DC
  Administered 2024-08-11 – 2024-08-14 (×4): 100 mg via ORAL
  Filled 2024-08-11 (×4): qty 1

## 2024-08-11 MED ORDER — HYDROMORPHONE HCL 2 MG PO TABS
2.0000 mg | ORAL_TABLET | Freq: Four times a day (QID) | ORAL | Status: DC | PRN
  Administered 2024-08-11 – 2024-08-13 (×7): 2 mg via ORAL
  Filled 2024-08-11 (×8): qty 1

## 2024-08-11 MED ORDER — TRAZODONE HCL 100 MG PO TABS
100.0000 mg | ORAL_TABLET | Freq: Every day | ORAL | Status: DC
  Administered 2024-08-11: 100 mg via ORAL
  Filled 2024-08-11 (×4): qty 1

## 2024-08-11 MED ORDER — WARFARIN SODIUM 2.5 MG PO TABS
3.7500 mg | ORAL_TABLET | Freq: Every day | ORAL | Status: DC
  Administered 2024-08-11: 3.75 mg via ORAL
  Filled 2024-08-11: qty 1

## 2024-08-11 MED ORDER — ONDANSETRON HCL 4 MG PO TABS: 4.0000 mg | ORAL_TABLET | Freq: Four times a day (QID) | ORAL | Status: DC | PRN

## 2024-08-11 MED ORDER — LACTATED RINGERS IV SOLN: INTRAVENOUS | Status: DC

## 2024-08-11 MED ORDER — ALBUTEROL SULFATE (2.5 MG/3ML) 0.083% IN NEBU: 2.5000 mg | INHALATION_SOLUTION | Freq: Four times a day (QID) | RESPIRATORY_TRACT | Status: DC | PRN

## 2024-08-11 MED ORDER — SODIUM PHOSPHATES 45 MMOLE/15ML IV SOLN
30.0000 mmol | Freq: Once | INTRAVENOUS | Status: AC
  Administered 2024-08-11: 30 mmol via INTRAVENOUS
  Filled 2024-08-11: qty 10

## 2024-08-11 MED ORDER — WARFARIN SODIUM 7.5 MG PO TABS: 7.5000 mg | ORAL_TABLET | ORAL | Status: DC

## 2024-08-11 MED ORDER — ADULT MULTIVITAMIN W/MINERALS CH
1.0000 | ORAL_TABLET | Freq: Every day | ORAL | Status: DC
  Administered 2024-08-11 – 2024-08-14 (×4): 1 via ORAL
  Filled 2024-08-11 (×4): qty 1

## 2024-08-11 MED ORDER — AMLODIPINE BESYLATE 10 MG PO TABS
10.0000 mg | ORAL_TABLET | Freq: Every day | ORAL | Status: DC
  Administered 2024-08-11: 10 mg via ORAL
  Filled 2024-08-11 (×2): qty 1

## 2024-08-11 MED ORDER — NALOXONE HCL 4 MG/0.1ML NA LIQD: 1.0000 | Freq: Once | NASAL | Status: DC | PRN

## 2024-08-11 MED ORDER — MAGNESIUM SULFATE 2 GM/50ML IV SOLN: 2.0000 g | Freq: Once | INTRAVENOUS | Status: DC

## 2024-08-11 MED ORDER — TRAZODONE HCL 50 MG PO TABS: 25.0000 mg | ORAL_TABLET | Freq: Every evening | ORAL | Status: DC | PRN

## 2024-08-11 MED ORDER — SODIUM CHLORIDE 0.9 % IV BOLUS (SEPSIS)
1000.0000 mL | Freq: Once | INTRAVENOUS | Status: AC
  Administered 2024-08-11: 1000 mL via INTRAVENOUS

## 2024-08-11 MED ORDER — MAGNESIUM HYDROXIDE 400 MG/5ML PO SUSP: 30.0000 mL | Freq: Every day | ORAL | Status: DC | PRN

## 2024-08-11 MED ORDER — ATORVASTATIN CALCIUM 20 MG PO TABS
40.0000 mg | ORAL_TABLET | Freq: Every day | ORAL | Status: DC
  Administered 2024-08-11 – 2024-08-14 (×4): 40 mg via ORAL
  Filled 2024-08-11 (×4): qty 2

## 2024-08-11 MED ORDER — PANTOPRAZOLE SODIUM 40 MG IV SOLR
40.0000 mg | Freq: Two times a day (BID) | INTRAVENOUS | Status: DC
  Administered 2024-08-11 – 2024-08-14 (×7): 40 mg via INTRAVENOUS
  Filled 2024-08-11 (×7): qty 10

## 2024-08-11 MED ORDER — PANCRELIPASE (LIP-PROT-AMYL) 36000-114000 UNITS PO CPEP
216000.0000 [IU] | ORAL_CAPSULE | Freq: Three times a day (TID) | ORAL | Status: DC
  Administered 2024-08-12 – 2024-08-13 (×3): 216000 [IU] via ORAL
  Filled 2024-08-11 (×4): qty 6

## 2024-08-11 MED ORDER — MAGNESIUM CHLORIDE 64 MG PO TBEC
1.0000 | DELAYED_RELEASE_TABLET | Freq: Every day | ORAL | Status: DC
  Administered 2024-08-11 – 2024-08-14 (×4): 64 mg via ORAL
  Filled 2024-08-11 (×5): qty 1

## 2024-08-11 MED ORDER — WARFARIN - PHYSICIAN DOSING INPATIENT
Freq: Every day | Status: DC
  Filled 2024-08-11: qty 1

## 2024-08-11 MED ORDER — WARFARIN SODIUM 2.5 MG PO TABS: 3.7500 mg | ORAL_TABLET | ORAL | Status: DC

## 2024-08-11 MED ORDER — HYDROMORPHONE HCL 2 MG PO TABS: 2.0000 mg | ORAL_TABLET | Freq: Three times a day (TID) | ORAL | Status: DC | PRN

## 2024-08-11 MED ORDER — ACETAMINOPHEN 650 MG RE SUPP: 650.0000 mg | Freq: Four times a day (QID) | RECTAL | Status: DC | PRN

## 2024-08-11 MED ORDER — VITAMIN D (ERGOCALCIFEROL) 1.25 MG (50000 UNIT) PO CAPS: 50000.0000 [IU] | ORAL_CAPSULE | ORAL | Status: DC

## 2024-08-11 MED ORDER — HYDROMORPHONE HCL 1 MG/ML IJ SOLN
0.5000 mg | INTRAMUSCULAR | Status: DC | PRN
  Administered 2024-08-11: 0.5 mg via INTRAVENOUS
  Filled 2024-08-11: qty 0.5

## 2024-08-11 MED ORDER — HYDROMORPHONE HCL 1 MG/ML IJ SOLN
1.0000 mg | INTRAMUSCULAR | Status: DC | PRN
  Administered 2024-08-11 – 2024-08-12 (×5): 1 mg via INTRAVENOUS
  Filled 2024-08-11 (×5): qty 1

## 2024-08-11 MED ORDER — LOSARTAN POTASSIUM 50 MG PO TABS
50.0000 mg | ORAL_TABLET | Freq: Every day | ORAL | Status: DC
  Administered 2024-08-11 – 2024-08-14 (×3): 50 mg via ORAL
  Filled 2024-08-11 (×4): qty 1

## 2024-08-11 MED ORDER — ONDANSETRON HCL 4 MG/2ML IJ SOLN
4.0000 mg | Freq: Four times a day (QID) | INTRAMUSCULAR | Status: DC | PRN
  Administered 2024-08-11 – 2024-08-13 (×2): 4 mg via INTRAVENOUS
  Filled 2024-08-11 (×2): qty 2

## 2024-08-11 MED ORDER — ONDANSETRON HCL 4 MG/2ML IJ SOLN
4.0000 mg | Freq: Once | INTRAMUSCULAR | Status: AC
  Administered 2024-08-11: 4 mg via INTRAVENOUS
  Filled 2024-08-11: qty 2

## 2024-08-11 MED ORDER — FOLIC ACID 1 MG PO TABS
1.0000 mg | ORAL_TABLET | Freq: Every day | ORAL | Status: DC
  Administered 2024-08-11 – 2024-08-14 (×4): 1 mg via ORAL
  Filled 2024-08-11 (×4): qty 1

## 2024-08-11 MED ORDER — THIAMINE HCL 100 MG/ML IJ SOLN
100.0000 mg | Freq: Once | INTRAMUSCULAR | Status: AC
  Administered 2024-08-11: 100 mg via INTRAVENOUS
  Filled 2024-08-11: qty 2

## 2024-08-11 MED ORDER — FAMOTIDINE 20 MG PO TABS
20.0000 mg | ORAL_TABLET | Freq: Two times a day (BID) | ORAL | Status: DC
  Administered 2024-08-11 – 2024-08-14 (×7): 20 mg via ORAL
  Filled 2024-08-11 (×7): qty 1

## 2024-08-11 MED ORDER — ACETAMINOPHEN 325 MG PO TABS
650.0000 mg | ORAL_TABLET | Freq: Four times a day (QID) | ORAL | Status: DC | PRN
  Administered 2024-08-11 – 2024-08-13 (×7): 650 mg via ORAL
  Filled 2024-08-11 (×7): qty 2

## 2024-08-11 MED ORDER — ENOXAPARIN SODIUM 40 MG/0.4ML IJ SOSY: 40.0000 mg | PREFILLED_SYRINGE | INTRAMUSCULAR | Status: DC

## 2024-08-11 NOTE — ED Triage Notes (Signed)
 Pt to ED vis EMS from  home, pt reports upper abd pain that radiates into her back, pt reports pain is same as when she has had pancreatitis flare up in past, pt reports she has been dx with chronic pancreatitis. Pt reports n/v.

## 2024-08-11 NOTE — Assessment & Plan Note (Signed)
-   Will continue bronchodilator therapy.

## 2024-08-11 NOTE — Progress Notes (Signed)
 Patient was educated on safety measures including non-skid socks and keeping the bed the lowest position.   Patient refused.

## 2024-08-11 NOTE — Assessment & Plan Note (Signed)
-   Will continue pain medications with improvement of her nausea and vomiting.

## 2024-08-11 NOTE — Assessment & Plan Note (Signed)
 Will continue statin therapy

## 2024-08-11 NOTE — Assessment & Plan Note (Addendum)
-   Will continue Lasix , Aldactone  and Jardiance as well as Cozaar .

## 2024-08-11 NOTE — H&P (Addendum)
 "     [facility]   PATIENT NAME: Sarah Sosa    MR#:  [id number]  DATE OF BIRTH:  [birth year]  DATE OF ADMISSION:  08/11/2024  PRIMARY CARE PHYSICIAN: [facility]   Patient is coming from: Home  REQUESTING/REFERRING PHYSICIAN: Ward, Josette SAILOR, DO  CHIEF COMPLAINT:   Chief Complaint  Patient presents with   Abdominal Pain    HISTORY OF PRESENT ILLNESS:  Sarah Sosa is a 44 y.o. African-American female with medical history significant for asthma, osteoarthritis, CHF, type 2 diabetes mellitus, status post aortic valve replacement on Coumadin , GERD, which essential hypertension and pancreatitis, who presented to the emergency room with acute onset of left upper quadrant abdominal pain with associated nausea and vomiting.  She has not been able to keep solid food and only a small amount of fluid.  She has not been able to keep chronic pain medications either.  No bilious or bloody vomitus.  She admitted to loose and occasionally watery diarrhea without melena or rectal bleeding per rectum.  No dysuria, oliguria or hematuria, urgency or frequency or flank pain.  No bleeding diathesis.  ED Course: When the patient came to the ER, BP 165/109 with otherwise normal vital signs.  Labs revealed blood glucose of 150 and chloride 96 with anion gap of 20, serum lipase 150 and AST 51.  PT was 2.1 and INR 24.6.  Alcohol level was less than 15. EKG as reviewed by me : EKG showed normal sinus rhythm with rate of 81 with anteroseptal Q waves Imaging: None.  The patient was given 1 mg of IV Dilaudid , 4 mg of IV Dilaudid , 1 L bolus of IV normal saline 100 mg of IV thiamine .  She will be admitted to a telemetry bed for further evaluation and management. PAST MEDICAL HISTORY:   Past Medical History:  Diagnosis Date   Arthritis    Asthma    CHF (congestive heart failure) (HCC)    Chronic pain disorder 03/19/2018   Diabetes mellitus without complication (HCC)    type 2   GERD  (gastroesophageal reflux disease)    Headache    Heart murmur    History of trichomoniasis 03/19/2018   Hypertension    Intractable nausea and vomiting    Pancreatitis    PID (pelvic inflammatory disease) 02/14/2018   Uterine leiomyoma 03/19/2018    PAST SURGICAL HISTORY:   Past Surgical History:  Procedure Laterality Date   ABDOMINAL HYSTERECTOMY     AORTIC VALVE REPLACEMENT  03/2022   CHOLECYSTECTOMY     DENTAL SURGERY     HYSTERECTOMY ABDOMINAL WITH SALPINGECTOMY Bilateral 04/15/2018   Procedure: HYSTERECTOMY ABDOMINAL WITH BILATERAL SALPINGECTOMY;  Surgeon: Connell Davies, MD;  Location: [facility] ORS;  Service: Gynecology;  Laterality: Bilateral;   kenn surgery Bilateral    KNEE ARTHROSCOPY Left 2012, 2013    SOCIAL HISTORY:   Social History   Tobacco Use   Smoking status: Some Days    Current packs/day: 0.50    Average packs/day: 0.5 packs/day for 22.0 years (11.0 ttl pk-yrs)    Types: Cigarettes   Smokeless tobacco: Never  Substance Use Topics   Alcohol use: Not Currently    FAMILY HISTORY:   Family History  Problem Relation Age of Onset   Fibroids Mother    Hypertension Mother    Arthritis Mother     DRUG ALLERGIES:  Allergies[1]  REVIEW OF SYSTEMS:   ROS As per history of present illness. All pertinent systems were reviewed  above. Constitutional, HEENT, cardiovascular, respiratory, GI, GU, musculoskeletal, neuro, psychiatric, endocrine, integumentary and hematologic systems were reviewed and are otherwise negative/unremarkable except for positive findings mentioned above in the HPI.   MEDICATIONS AT HOME:   Prior to Admission medications  Medication Sig Start Date End Date Taking? Authorizing Provider  albuterol  (VENTOLIN  HFA) 108 (90 Base) MCG/ACT inhaler Inhale 1-2 puffs into the lungs every 6 (six) hours as needed. 07/26/23   [provider]  amLODipine  (NORVASC ) 10 MG tablet Take 1 tablet (10 mg total) by mouth daily. 07/19/23   Darci Pore, MD  atorvastatin  (LIPITOR) 40 MG tablet Take 40 mg by mouth daily. 10/17/22   [provider]  buprenorphine  (SUBUTEX ) 8 MG SUBL SL tablet Place 8 mg under the tongue. Place 1 tablet (8 mg total) under the tongue 3 (three) times daily 01/11/24   [provider]  furosemide  (LASIX ) 40 MG tablet Take 0.5 tablets (20 mg total) by mouth daily as needed. Home med. Patient taking differently: Take 40 mg by mouth daily as needed for edema. Home med. 10/26/23   Awanda City, MD  HYDROmorphone  (DILAUDID ) 2 MG tablet Take 1 tablet (2 mg total) by mouth every 8 (eight) hours as needed for severe pain (pain score 7-10). 06/12/24   Patel, Sona, MD  lipase/protease/amylase (CREON ) 36000 UNITS CPEP capsule Take 1 capsule (36,000 Units total) by mouth 3 (three) times daily with meals. Patient taking differently: Take 216,000 Units by mouth 3 (three) times daily with meals. 08/13/23   Jhonny Calvin NOVAK, MD  losartan  (COZAAR ) 50 MG tablet Take 1 tablet (50 mg total) by mouth daily. 07/19/23   Darci Pore, MD  magnesium  chloride (SLOW-MAG) 64 MG TBEC SR tablet Take 1 tablet (64 mg total) by mouth daily. 12/14/23   Alexander, Natalie, DO  Multiple Vitamin (MULTIVITAMIN WITH MINERALS) TABS tablet Take 1 tablet by mouth daily. 06/13/24   Patel, Sona, MD  naloxone  (NARCAN ) nasal spray 4 mg/0.1 mL Place 1 spray into the nose once as needed (to reverse overdose). 02/24/23   [provider]  omeprazole (PRILOSEC) 40 MG capsule Take 40 mg by mouth daily. 02/23/23   [provider]  ondansetron  (ZOFRAN -ODT) 4 MG disintegrating tablet Take 4 mg by mouth every 8 (eight) hours as needed for nausea. 02/08/24   [provider]  spironolactone  (ALDACTONE ) 25 MG tablet Take 25 mg by mouth daily. 02/03/22   [provider]  traZODone  (DESYREL ) 50 MG tablet Take 100 mg by mouth at bedtime. 09/07/21   [provider]  Vitamin D , Ergocalciferol , (DRISDOL ) 1.25 MG  (50000 UNIT) CAPS capsule Take 1 capsule (50,000 Units total) by mouth every 7 (seven) days. 06/16/24   Patel, Sona, MD  warfarin (COUMADIN ) 7.5 MG tablet Take 0.5 tablets (3.75 mg total) by mouth daily. 05/03/24   Jerelene Critchley, MD      VITAL SIGNS:  Blood pressure (!) 165/109, pulse 88, temperature 98.8 F (37.1 C), resp. rate 18, height 5' 4 (1.626 m), weight 113 kg, last menstrual period 03/25/2018, SpO2 98%.  PHYSICAL EXAMINATION:  Physical Exam  GENERAL:  44 y.o.-year-old African-American female patient lying in the bed with no acute distress.  EYES: Pupils equal, round, reactive to light and accommodation. No scleral icterus. Extraocular muscles intact.  HEENT: Head atraumatic, normocephalic. Oropharynx and nasopharynx clear.  NECK:  Supple, no jugular venous distention. No thyroid enlargement, no tenderness.  LUNGS: Normal breath sounds bilaterally, no wheezing, rales,rhonchi or crepitation. No use of accessory muscles  of respiration.  CARDIOVASCULAR: Regular rate and rhythm, S1, S2 normal. No murmurs, rubs, or gallops.  ABDOMEN: Soft, nondistended, with epigastric and left upper quadrant abdominal tenderness without rebound tenderness guarding or rigidity. Bowel sounds present. No organomegaly or mass.  EXTREMITIES: No pedal edema, cyanosis, or clubbing.  NEUROLOGIC: Cranial nerves II through XII are intact. Muscle strength 5/5 in all extremities. Sensation intact. Gait not checked.  PSYCHIATRIC: The patient is alert and oriented x 3.  Normal affect and good eye contact. SKIN: No obvious rash, lesion, or ulcer.   LABORATORY PANEL:   CBC Recent Labs  Lab 08/11/24 0339  WBC 5.9  HGB 13.4  HCT 40.0  PLT 235   ------------------------------------------------------------------------------------------------------------------  Chemistries  Recent Labs  Lab 08/11/24 0339  NA 137  K 3.8  CL 96*  CO2 22  GLUCOSE 150*  BUN 9  CREATININE 0.81  CALCIUM  9.2  MG 0.8*   AST 51*  ALT 16  ALKPHOS 89  BILITOT 1.1   ------------------------------------------------------------------------------------------------------------------  Cardiac Enzymes No results for input(s): TROPONINI in the last 168 hours. ------------------------------------------------------------------------------------------------------------------  RADIOLOGY:  No results found.    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 - The patient will be admitted to a telemetry bed. - She will be kept NPO. - Will follow serum lipase levels. - Pain management will be provided. - As needed antiemetics will be given. - Will continue Creon .  Hypomagnesemia - Aggressive magnesium  replacement will be provided.  (HFpEF) heart failure with preserved ejection fraction (HCC) - Will continue Lasix , Aldactone  and Jardiance as well as Cozaar .  Status post aortic valve replacement with metallic valve - Will continue Coumadin .  Dyslipidemia - Will continue statin therapy.  Chronic obstructive pulmonary disease (COPD) (HCC) - Will continue bronchodilator therapy.  Controlled diabetes mellitus type II without complication (HCC) - The patient will be placed on supplemental coverage with NovoLog .  Chronic pain syndrome - Will continue pain medications with improvement of her nausea and vomiting.   DVT prophylaxis: Coumadin . Advanced Care Planning:  Code Status: full code.  Family Communication:  The plan of care was discussed in details with the patient (and family). I answered all questions. The patient agreed to proceed with the above mentioned plan. Further management will depend upon hospital course. Disposition Plan: Back to previous home environment Consults called: none.  All the records are reviewed and case discussed with ED provider.  Status is: Inpatient  At the time of the  admission, it appears that the appropriate admission status for this patient is inpatient.  This is judged to be reasonable and necessary in order to provide the required intensity of service to ensure the patient's safety given the presenting symptoms, physical exam findings and initial radiographic and laboratory data in the context of comorbid conditions.  The patient requires inpatient status due to high intensity of service, high risk of further deterioration and high frequency of surveillance required.  I certify that at the time of admission, it is my clinical judgment that the patient will require inpatient hospital care extending more than 2 midnights.                            Dispo: The patient is from: Home              Anticipated d/c is to: Home  Patient currently is not medically stable to d/c.              Difficult to place patient: No  Madison DELENA Peaches M.D on 08/11/2024 at 5:38 AM  [facility]   From 7 PM-7 AM, contact night-coverage [email or web address]  CC: Primary care physician; [facility]     [1]  Allergies Allergen Reactions   Latex Anaphylaxis, Hives and Itching    Other reaction(s): Other (See Comments) SKIN BURNING & PEELING    Lisinopril  Swelling    Other Reaction(s): Angioedema   Nsaids Other (See Comments)    GI Ulcers   Tomato Anaphylaxis, Hives and Itching   Gabapentin  Palpitations   Metformin      Other Reaction(s): Abdominal Pain  Causes pancreatitis to worsen   Semaglutide     Other Reaction(s): Other (See Comments)    Acute on chronic pancreatitis 09/2023   Duloxetine Anxiety    Makes her jittery   Tramadol Hives and Itching   "

## 2024-08-11 NOTE — Assessment & Plan Note (Addendum)
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 - The patient will be admitted to a telemetry bed. - She will be kept NPO. - Will follow serum lipase levels. - Pain management will be provided. - As needed antiemetics will be given. - Will continue Creon .

## 2024-08-11 NOTE — Progress Notes (Signed)
 Patient admitted for acute on chronic pancreatitis Assessment plan as below.  Rest of the details per H&P today No charge   Acute on chronic pancreatitis (HCC) likely alcoholic acute on chronic pancreatitis, TG pending Clear liquid diet, gentle IV fluids due to poor po intake Pain management with Subutex , Tylenol , IV Dilaudid , p.o. Dilaudid  as needed Antiemetics as needed continue Creon .  Alcohol use No symptoms/signs of withdrawal, last drink 01/22 FA, MV, thiamine  Monitor for withdrawal  Severe aortic stenosis s/p mechanical AVR 04/10/2022, Ascending Aortic Aneurysm s/p repair 03/2022 Coumadin  and monitor INR, per pharmacy   Chronic diastolic heart failure, HTN & HLD Hold Lasix , Aldactone .  Due to poor p.o. intake Continue Jardiance, Cozaar , Amlodipine     Hypomagnesemia Hypophosphatemia Monitor electrolytes and replete as needed.  GERD PPI, Pepcid    Chronic obstructive pulmonary disease (COPD) Tobacco use bronchodilator therapy. Counseled cessation, refused nicotine  patches/gum   Prediabetes monitor blood glucose   Chronic pain syndrome Subutex  Follow-up with pain clinic  Obesity class III Body mass index is 42.76 kg/m. Outpatient follow up for lifestyle modification and risk factor management

## 2024-08-11 NOTE — Plan of Care (Signed)

## 2024-08-11 NOTE — ED Notes (Signed)
 Called CCMD to place pt on cardiac monitor

## 2024-08-11 NOTE — Assessment & Plan Note (Signed)
 Aggressive magnesium  replacement will be provided.

## 2024-08-11 NOTE — Assessment & Plan Note (Signed)
-   Will continue Coumadin .

## 2024-08-11 NOTE — Assessment & Plan Note (Signed)
-   The patient will be placed on supplemental coverage with NovoLog. 

## 2024-08-11 NOTE — ED Provider Notes (Addendum)
 "  [facility] Provider [facility]    Event Date/Time   First MD Initiated Contact with Patient 08/11/24 [id number]     (approximate)   History   Abdominal Pain   HPI  Sarah Sosa is a 44 y.o. female with history of CHF, hypertension, diabetes, GERD, alcohol-induced pancreatitis, alcohol use disorder, aortic valve replacement on Coumadin  who presents to the emergency department with complaints of left upper quadrant abdominal pain, nausea and vomiting.  Unable to keep food down and only a small amount of liquid.  She is on chronic pain medication at home but states she is unable to keep this down either.  No fevers.  Has had some diarrhea.  No dysuria, hematuria, vaginal bleeding or discharge.  No chest pain or shortness of breath.  States this feels like her chronic pancreatitis.  States she last drank alcohol 4 days ago.  Denies any history of alcohol withdrawal seizures, DTs.  She is tremulous here today and states that that has never happened to her before.  She states she is not sure if it is from pain.  Patient has had previous hysterectomy and bilateral salpingectomy, cholecystectomy.   History provided by patient and EMS.    Past Medical History:  Diagnosis Date   Arthritis    Asthma    CHF (congestive heart failure) (HCC)    Chronic pain disorder 03/19/2018   Diabetes mellitus without complication (HCC)    type 2   GERD (gastroesophageal reflux disease)    Headache    Heart murmur    History of trichomoniasis 03/19/2018   Hypertension    Intractable nausea and vomiting    Pancreatitis    PID (pelvic inflammatory disease) 02/14/2018   Uterine leiomyoma 03/19/2018    Past Surgical History:  Procedure Laterality Date   ABDOMINAL HYSTERECTOMY     AORTIC VALVE REPLACEMENT  03/2022   CHOLECYSTECTOMY     DENTAL SURGERY     HYSTERECTOMY ABDOMINAL WITH SALPINGECTOMY Bilateral 04/15/2018   Procedure: HYSTERECTOMY ABDOMINAL WITH BILATERAL SALPINGECTOMY;   Surgeon: Connell Davies, MD;  Location: [facility] ORS;  Service: Gynecology;  Laterality: Bilateral;   kenn surgery Bilateral    KNEE ARTHROSCOPY Left 2012, 2013    MEDICATIONS:  Prior to Admission medications  Medication Sig Start Date End Date Taking? Authorizing Provider  albuterol  (VENTOLIN  HFA) 108 (90 Base) MCG/ACT inhaler Inhale 1-2 puffs into the lungs every 6 (six) hours as needed. 07/26/23   [provider]  amLODipine  (NORVASC ) 10 MG tablet Take 1 tablet (10 mg total) by mouth daily. 07/19/23   Darci Pore, MD  atorvastatin  (LIPITOR) 40 MG tablet Take 40 mg by mouth daily. 10/17/22   [provider]  buprenorphine  (SUBUTEX ) 8 MG SUBL SL tablet Place 8 mg under the tongue. Place 1 tablet (8 mg total) under the tongue 3 (three) times daily 01/11/24   [provider]  furosemide  (LASIX ) 40 MG tablet Take 0.5 tablets (20 mg total) by mouth daily as needed. Home med. Patient taking differently: Take 40 mg by mouth daily as needed for edema. Home med. 10/26/23   Awanda City, MD  HYDROmorphone  (DILAUDID ) 2 MG tablet Take 1 tablet (2 mg total) by mouth every 8 (eight) hours as needed for severe pain (pain score 7-10). 06/12/24   Patel, Sona, MD  lipase/protease/amylase (CREON ) 36000 UNITS CPEP capsule Take 1 capsule (36,000 Units total) by mouth 3 (three) times daily with meals. Patient taking differently: Take 216,000 Units by mouth 3 (three)  times daily with meals. 08/13/23   Jhonny Calvin NOVAK, MD  losartan  (COZAAR ) 50 MG tablet Take 1 tablet (50 mg total) by mouth daily. 07/19/23   Darci Pore, MD  magnesium  chloride (SLOW-MAG) 64 MG TBEC SR tablet Take 1 tablet (64 mg total) by mouth daily. 12/14/23   Alexander, Natalie, DO  Multiple Vitamin (MULTIVITAMIN WITH MINERALS) TABS tablet Take 1 tablet by mouth daily. 06/13/24   Patel, Sona, MD  naloxone  (NARCAN ) nasal spray 4 mg/0.1 mL Place 1 spray into the nose once as needed (to reverse overdose). 02/24/23    [provider]  omeprazole (PRILOSEC) 40 MG capsule Take 40 mg by mouth daily. 02/23/23   [provider]  ondansetron  (ZOFRAN -ODT) 4 MG disintegrating tablet Take 4 mg by mouth every 8 (eight) hours as needed for nausea. 02/08/24   [provider]  spironolactone  (ALDACTONE ) 25 MG tablet Take 25 mg by mouth daily. 02/03/22   [provider]  traZODone  (DESYREL ) 50 MG tablet Take 100 mg by mouth at bedtime. 09/07/21   [provider]  Vitamin D , Ergocalciferol , (DRISDOL ) 1.25 MG (50000 UNIT) CAPS capsule Take 1 capsule (50,000 Units total) by mouth every 7 (seven) days. 06/16/24   Patel, Sona, MD  warfarin (COUMADIN ) 7.5 MG tablet Take 0.5 tablets (3.75 mg total) by mouth daily. 05/03/24   Jerelene Critchley, MD    Physical Exam   Triage Vital Signs: ED Triage Vitals  Encounter Vitals Group     BP 08/11/24 0332 (!) 165/109     Girls Systolic BP Percentile --      Girls Diastolic BP Percentile --      Boys Systolic BP Percentile --      Boys Diastolic BP Percentile --      Pulse Rate 08/11/24 0332 88     Resp 08/11/24 0332 18     Temp 08/11/24 0332 98.8 F (37.1 C)     Temp src --      SpO2 08/11/24 0332 98 %     Weight 08/11/24 0331 249 lb 1.9 oz (113 kg)     Height 08/11/24 0331 5' 4 (1.626 m)     Head Circumference --      Peak Flow --      Pain Score 08/11/24 0331 10     Pain Loc --      Pain Education --      Exclude from Growth Chart --     Most recent vital signs: Vitals:   08/11/24 0332  BP: (!) 165/109  Pulse: 88  Resp: 18  Temp: 98.8 F (37.1 C)  SpO2: 98%    CONSTITUTIONAL: Alert, responds appropriately to questions.  Chronically ill-appearing HEAD: Normocephalic, atraumatic EYES: Conjunctivae clear, pupils appear equal, sclera nonicteric ENT: normal nose; moist mucous membranes NECK: Supple, normal ROM CARD: RRR; S1 and S2 appreciated RESP: Normal chest excursion without splinting or tachypnea; breath sounds clear  and equal bilaterally; no wheezes, no rhonchi, no rales, no hypoxia or respiratory distress, speaking full sentences ABD/GI: Non-distended; soft, tender in the epigastric region and left upper quadrant, no guarding or rebound, no tenderness at McBurney's point BACK: The back appears normal EXT: Normal ROM in all joints; no deformity noted, no edema SKIN: Normal color for age and race; warm; no rash on exposed skin NEURO: Moves all extremities equally, normal speech, mildly tremulous PSYCH: The patient's mood and manner are appropriate.   ED Results / Procedures / Treatments   LABS: (all labs ordered  are listed, but only abnormal results are displayed) Labs Reviewed  LIPASE, BLOOD - Abnormal; Notable for the following components:      Result Value   Lipase 150 (*)    All other components within normal limits  COMPREHENSIVE METABOLIC PANEL WITH GFR - Abnormal; Notable for the following components:   Chloride 96 (*)    Glucose, Bld 150 (*)    AST 51 (*)    Anion gap 20 (*)    All other components within normal limits  PROTIME-INR - Abnormal; Notable for the following components:   Prothrombin Time 24.6 (*)    INR 2.1 (*)    All other components within normal limits  CBC  ETHANOL  URINALYSIS, ROUTINE W REFLEX MICROSCOPIC  MAGNESIUM   TROPONIN T, HIGH SENSITIVITY     EKG:  EKG Interpretation Date/Time:  Monday August 11 2024 04:02:56 EST Ventricular Rate:  81 PR Interval:  164 QRS Duration:  100 QT Interval:  356 QTC Calculation: 414 R Axis:   55  Text Interpretation: Sinus rhythm Probable left atrial enlargement Anteroseptal infarct, old Baseline wander in lead(s) I III aVL Confirmed by Neomi Neptune [phone]) on 08/11/2024 4:04:51 AM         RADIOLOGY: My personal review and interpretation of imaging:    I have personally reviewed all radiology reports.   No results found.   PROCEDURES:  Critical Care performed: YES   CRITICAL CARE Performed by: Neptune Neomi   Total critical care time: 30 minutes  Critical care time was exclusive of separately billable procedures and treating other patients.  Critical care was necessary to treat or prevent imminent or life-threatening deterioration.  Critical care was time spent personally by me on the following activities: development of treatment plan with patient and/or surrogate as well as nursing, discussions with consultants, evaluation of patient's response to treatment, examination of patient, obtaining history from patient or surrogate, ordering and performing treatments and interventions, ordering and review of laboratory studies, ordering and review of radiographic studies, pulse oximetry and re-evaluation of patient's condition.     SABRA1-3 Lead EKG Interpretation  Performed by: Lannie Heaps, Neptune SAILOR, DO Authorized by: Dondrea Clendenin, Neptune SAILOR, DO     Interpretation: normal     ECG rate:  88   ECG rate assessment: normal     Rhythm: sinus rhythm     Ectopy: none     Conduction: normal       IMPRESSION / MDM / ASSESSMENT AND PLAN / ED COURSE  I reviewed the triage vital signs and the nursing notes.    Patient here with left upper abdominal pain, vomiting.  History of alcohol-induced pancreatitis.  The patient is on the cardiac monitor to evaluate for evidence of arrhythmia and/or significant heart rate changes.   DIFFERENTIAL DIAGNOSIS (includes but not limited to):   Alcohol-induced pancreatitis, gastritis, GERD, gastroenteritis, dehydration, less likely ACS, doubt appendicitis, bowel obstruction, perforation   Patient's presentation is most consistent with acute presentation with potential threat to life or bodily function.   PLAN: Will obtain labs, urine, EKG, give IV fluids, pain and nausea medicine.  Will give IV thiamine  as well.  She is slightly tremulous and hypertensive but states that she has never had alcohol withdrawal before.  Will continue to monitor and give either Ativan  or  phenobarbital as needed.  She does not have a surgical abdomen today.  She has a very well-documented history of acute on chronic pancreatitis induced from alcohol use disorder.  Patient  has had many CT scans (12 since Feb 2024 in our system) and I feel risk of radiation exposure outweighs any benefits.  Her last CT scan on 06/08/2024 showed acute uncomplicated pancreatitis.   MEDICATIONS GIVEN IN ED: Medications  0.9 %  sodium chloride  infusion (0 mLs Intravenous Hold 08/11/24 0356)  HYDROmorphone  (DILAUDID ) injection 1 mg (has no administration in time range)  sodium chloride  0.9 % bolus 1,000 mL (1,000 mLs Intravenous New Bag/Given 08/11/24 0351)  HYDROmorphone  (DILAUDID ) injection 1 mg (1 mg Intravenous Given 08/11/24 0347)  ondansetron  (ZOFRAN ) injection 4 mg (4 mg Intravenous Given 08/11/24 0346)  thiamine  (VITAMIN B1) injection 100 mg (100 mg Intravenous Given 08/11/24 0348)     ED COURSE: Patient's labs show lipase of 150 consistent with her history of recurrent pancreatitis.  Normal hemoglobin.  AST slightly elevated but otherwise normal LFTs.  INR is 2.1.  Ethanol level negative.  Patient reports nausea improved but pain came back quickly after Dilaudid .  It looks like she has been previously admitted when she has had flares of her pancreatitis and she feels she needs admission again.  Will reach out to the hospitalist for admission for acute on chronic alcohol induced pancreatitis.   Patient's magnesium  level has come back at 0.8.  Will give IV replacement and keep on cardiac monitoring.  No hypokalemia.  No EKG interval abnormalities.  CONSULTS:  Consulted and discussed patient's case with hospitalist, Dr. Lawence.  I have recommended admission and consulting physician agrees and will place admission orders.  Patient (and family if present) agree with this plan.   I reviewed all nursing notes, vitals, pertinent previous records.  All labs, EKGs, imaging ordered have been independently  reviewed and interpreted by myself.    OUTSIDE RECORDS REVIEWED: Reviewed last admission in November 2025.       FINAL CLINICAL IMPRESSION(S) / ED DIAGNOSES   Final diagnoses:  Alcohol-induced chronic pancreatitis (HCC)     Rx / DC Orders   ED Discharge Orders     None        Note:  This document was prepared using Dragon voice recognition software and may include unintentional dictation errors.   Ozias Dicenzo, Josette SAILOR, DO 08/11/24 0448    Francisca Harbuck, Josette SAILOR, DO 08/11/24 9465  "

## 2024-08-11 NOTE — Progress Notes (Signed)
 " PROGRESS NOTE    Shivonne Schwartzman  FMW:[id number] DOB: [birth year] DOA: 08/11/2024 PCP: [facility]  Chief Complaint  Patient presents with   Abdominal Pain    Hospital Course:  Sarah Sosa is a 44 y.o. African-American female with medical history significant for asthma, osteoarthritis, CHF, type 2 diabetes mellitus, status post aortic valve replacement on Coumadin , GERD, which essential hypertension and pancreatitis, who presented to the emergency room with acute onset of left upper quadrant abdominal pain with associated nausea and vomiting.  Patient admitted for acute on chronic pancreatitis.  Hospital course as below   Subjective: Reports symptoms improving, nausea/vomiting resolved. Abdominal pain better and able to tolerate p.o. intake Advance to low-fat diet Wean opioids, anticipate discharge in 1 to 2 days   Objective: Vitals:   08/11/24 0331 08/11/24 0332 08/11/24 0647 08/11/24 0745  BP:  (!) 165/109 (!) 143/88 (!) 135/94  Pulse:  88 81 65  Resp:  18  16  Temp:  98.8 F (37.1 C) 98.5 F (36.9 C) 98.6 F (37 C)  TempSrc:   Oral   SpO2:  98% 99% 95%  Weight: 113 kg     Height: 5' 4 (1.626 m)      No intake or output data in the 24 hours ending 08/11/24 0834 Filed Weights   08/11/24 0331  Weight: 113 kg    Examination: General:  44 y.o.-year-old African-American female patient lying in the bed with no acute distress.  Neck:  Supple, no jugular venous distention Lungs: Normal breath sounds bilaterally, no wheezing, rales,rhonchi or crepitation Cardio: Regular rate and rhythm, S1, S2 normal. No murmurs, rubs, or gallops.  Abd: Soft, nondistended, with mild epigastric and left upper quadrant abdominal tenderness without rebound tenderness guarding or rigidity Ext: No pedal edema, cyanosis, or clubbing.  Neuro: Cranial nerves II through XII are intact. Muscle strength 5/5 in all extremities Psych: The patient is alert and oriented x 3.  Normal affect and  good eye contact.  Assessment & Plan:  Acute on chronic pancreatitis (HCC) likely alcoholic acute on chronic pancreatitis Advance to low-fat diet, discontinue IV fluids Pain management with Subutex , Tylenol , IV Dilaudid  wean q6, p.o. Dilaudid  as needed Antiemetics as needed continue Creon   Alcohol use No symptoms/signs of withdrawal, last drink 01/22 FA, MV, thiamine  Monitor for withdrawal  Severe aortic stenosis s/p mechanical AVR 04/10/2022, Ascending Aortic Aneurysm s/p repair 03/2022 Coumadin  and monitor INR, per pharmacy   Chronic diastolic heart failure, HTN & HLD Resume home Lasix , Aldactone  Continue Jardiance, Cozaar , Amlodipine     Hypomagnesemia Hypophosphatemia Monitor electrolytes and replete as needed  GERD PPI, Pepcid    Chronic obstructive pulmonary disease (COPD) Tobacco use bronchodilator therapy. Counseled cessation, refused nicotine  patches/gum   Prediabetes SSI, monitor blood glucose   Chronic pain syndrome Subutex  Follow-up with pain clinic  Obesity class III Body mass index is 42.76 kg/m. Outpatient follow up for lifestyle modification and risk factor management   DVT prophylaxis: Warfarin   Code Status: Full Code Disposition:  Home  Consultants:  None  Procedures:  None  Antimicrobials:  Anti-infectives (From admission, onward)    None       Data Reviewed: I have personally reviewed following labs and imaging studies CBC: Recent Labs  Lab 08/11/24 0339  WBC 5.9  HGB 13.4  HCT 40.0  MCV 95.9  PLT 235   Basic Metabolic Panel: Recent Labs  Lab 08/11/24 0339  NA 137  K 3.8  CL 96*  CO2 22  GLUCOSE  150*  BUN 9  CREATININE 0.81  CALCIUM  9.2  MG 0.8*   GFR: Estimated Creatinine Clearance: 111.4 mL/min (by C-G formula based on SCr of 0.81 mg/dL). Liver Function Tests: Recent Labs  Lab 08/11/24 0339  AST 51*  ALT 16  ALKPHOS 89  BILITOT 1.1  PROT 7.8  ALBUMIN 4.3   CBG: No results for input(s): GLUCAP  in the last 168 hours.  No results found for this or any previous visit (from the past 240 hours).   Radiology Studies: No results found.  Scheduled Meds:  amLODipine   10 mg Oral Daily   atorvastatin   40 mg Oral Daily   buprenorphine   8 mg Sublingual TID   lipase/protease/amylase  216,000 Units Oral TID WC   losartan   50 mg Oral Daily   magnesium  chloride  1 tablet Oral Daily   multivitamin with minerals  1 tablet Oral Daily   pantoprazole  (PROTONIX ) IV  40 mg Intravenous Q12H   spironolactone   25 mg Oral Daily   traZODone   100 mg Oral QHS   warfarin  3.75 mg Oral q1600   Warfarin - Physician Dosing Inpatient   Does not apply q1600   Continuous Infusions:  sodium chloride  125 mL/hr at 08/11/24 0454   0.9 % NaCl with KCl 20 mEq / L 100 mL/hr at 08/11/24 0531   magnesium  sulfate bolus IVPB 2 g (08/11/24 0754)     LOS: 0 days  MDM: Patient is high risk for one or more organ failure.  They necessitate ongoing hospitalization for continued IV therapies and subsequent lab monitoring. Total time spent interpreting labs and vitals, reviewing the medical record, coordinating care amongst consultants and care team members, directly assessing and discussing care with the patient and/or family: 55 min Laree Lock, MD [facility]  To contact the attending physician between 7A-7P please use Epic Chat. To contact the covering physician during after hours 7P-7A, please review Amion.  08/11/2024, 8:34 AM   *This document has been created with the assistance of dictation software. Please excuse typographical errors. *   "

## 2024-08-12 LAB — MAGNESIUM: Magnesium: 1.4 mg/dL — ABNORMAL LOW (ref 1.7–2.4)

## 2024-08-12 LAB — CBC
HCT: 34.7 % — ABNORMAL LOW (ref 36.0–46.0)
Hemoglobin: 11.5 g/dL — ABNORMAL LOW (ref 12.0–15.0)
MCH: 32 pg (ref 26.0–34.0)
MCHC: 33.1 g/dL (ref 30.0–36.0)
MCV: 96.7 fL (ref 80.0–100.0)
Platelets: 176 10*3/uL (ref 150–400)
RBC: 3.59 MIL/uL — ABNORMAL LOW (ref 3.87–5.11)
RDW: 12.7 % (ref 11.5–15.5)
WBC: 3.1 10*3/uL — ABNORMAL LOW (ref 4.0–10.5)
nRBC: 0 % (ref 0.0–0.2)

## 2024-08-12 LAB — BASIC METABOLIC PANEL WITH GFR
Anion gap: 9 (ref 5–15)
BUN: 6 mg/dL (ref 6–20)
CO2: 26 mmol/L (ref 22–32)
Calcium: 8.7 mg/dL — ABNORMAL LOW (ref 8.9–10.3)
Chloride: 101 mmol/L (ref 98–111)
Creatinine, Ser: 0.69 mg/dL (ref 0.44–1.00)
GFR, Estimated: >60 mL/min
Glucose, Bld: 122 mg/dL — ABNORMAL HIGH (ref 70–99)
Potassium: 3.6 mmol/L (ref 3.5–5.1)
Sodium: 135 mmol/L (ref 135–145)

## 2024-08-12 LAB — GLUCOSE, CAPILLARY: Glucose-Capillary: 185 mg/dL — ABNORMAL HIGH (ref 70–99)

## 2024-08-12 LAB — PROTIME-INR
INR: 3.7 — ABNORMAL HIGH (ref 0.8–1.2)
Prothrombin Time: 38.4 s — ABNORMAL HIGH (ref 11.4–15.2)

## 2024-08-12 LAB — LIPASE, BLOOD: Lipase: 68 U/L — ABNORMAL HIGH (ref 11–51)

## 2024-08-12 LAB — PHOSPHORUS: Phosphorus: 3 mg/dL (ref 2.5–4.6)

## 2024-08-12 MED ORDER — MAGNESIUM SULFATE 4 GM/100ML IV SOLN
4.0000 g | Freq: Once | INTRAVENOUS | Status: AC
  Administered 2024-08-12: 4 g via INTRAVENOUS
  Filled 2024-08-12: qty 100

## 2024-08-12 MED ORDER — HYDROXYZINE HCL 25 MG PO TABS: 25.0000 mg | ORAL_TABLET | Freq: Three times a day (TID) | ORAL | Status: DC | PRN

## 2024-08-12 MED ORDER — HYDROMORPHONE HCL 1 MG/ML IJ SOLN
1.0000 mg | Freq: Four times a day (QID) | INTRAMUSCULAR | Status: DC | PRN
  Administered 2024-08-12: 1 mg via INTRAVENOUS
  Filled 2024-08-12: qty 1

## 2024-08-12 MED ORDER — HYDROMORPHONE HCL 1 MG/ML IJ SOLN
0.5000 mg | Freq: Four times a day (QID) | INTRAMUSCULAR | Status: DC | PRN
  Administered 2024-08-13: 0.5 mg via INTRAVENOUS
  Filled 2024-08-12: qty 0.5

## 2024-08-12 MED ORDER — POLYETHYLENE GLYCOL 3350 17 G PO PACK
17.0000 g | PACK | Freq: Every day | ORAL | Status: DC
  Administered 2024-08-12 – 2024-08-14 (×3): 17 g via ORAL
  Filled 2024-08-12 (×3): qty 1

## 2024-08-12 MED ORDER — PHENYLEPHRINE-MINERAL OIL-PET 0.25-14-74.9 % RE OINT
1.0000 | TOPICAL_OINTMENT | Freq: Two times a day (BID) | RECTAL | Status: DC | PRN
  Administered 2024-08-12: 1 via RECTAL
  Filled 2024-08-12: qty 57

## 2024-08-12 MED ORDER — WARFARIN - PHARMACIST DOSING INPATIENT: Freq: Every day | Status: DC

## 2024-08-12 MED ORDER — SPIRONOLACTONE 25 MG PO TABS
25.0000 mg | ORAL_TABLET | Freq: Every day | ORAL | Status: DC
  Administered 2024-08-13 – 2024-08-14 (×2): 25 mg via ORAL
  Filled 2024-08-12 (×3): qty 1

## 2024-08-12 MED ORDER — FUROSEMIDE 40 MG PO TABS
40.0000 mg | ORAL_TABLET | Freq: Every day | ORAL | Status: DC
  Administered 2024-08-13: 40 mg via ORAL
  Filled 2024-08-12 (×2): qty 1

## 2024-08-12 NOTE — Plan of Care (Signed)

## 2024-08-12 NOTE — Plan of Care (Signed)

## 2024-08-12 NOTE — Discharge Instructions (Signed)

## 2024-08-12 NOTE — Progress Notes (Signed)
" °  ° °      CROSS COVER NOTE  NAME: Sarah Sosa MRN: [id number] DOB : 1980-10-24 ATTENDING PHYSICIAN: Jerelene Critchley, MD    Date of Service   08/12/2024   HPI/Events of Note   Subjective: Message received from RN p[patient here for acute on chronic pancreatitis. I just want to inform you that patient is complaining of SOB, chest tightness. hands are shaking, with complain of left arm numbness and she said she feels like she is going to pass out. initial v/s shows 17/108 map 127, RR 22, O2 sat 99 on room air, HR 131. blood sugar 182, EKG done Shows Sinus Tachycardia. latest V/s 147/84 map 101 HR 108, RR 20, O2 sat 100 room air. afebrile. Please review. Thank you.    On arrival to bedside patient is seated in bed and anxious. She is no longer short of breath but is describing chest palpitations causing chest tightness with radiation to her left arm and increased anxiety since the palpitations. She reports this first started in Late December and has happened twice since that time with the most recent being approximately two weeks ago. She denies chest pain, nausea, vomiting. Troponin negative 08/11/24.   HPI: 44 y.o. African-American female with medical history significant for asthma, osteoarthritis, CHF, type 2 diabetes mellitus, status post aortic valve replacement on Coumadin , GERD, which essential hypertension and pancreatitis, who presented to the emergency room with acute onset of left upper quadrant abdominal pain with associated nausea and vomiting. Currently being treated for acute on chronic pancreatitis.  Objective:   Vitals:   08/12/24 1948 08/12/24 2228 08/12/24 2230 08/12/24 2239  BP: 132/80 (!) 170/108 (!) 168/99 (!) 147/84  Pulse: 88 (!) 131 (!) 138 (!) 108  Resp: 18 (!) 22 20 20   Temp: 98.2 F (36.8 C) 98.2 F (36.8 C)  98.7 F (37.1 C)  TempSrc:  Oral  Oral  SpO2: 100% 99% 100% 100%  Weight:      Height:        Physical Exam Vitals and nursing note reviewed.   HENT:     Head: Normocephalic.  Cardiovascular:     Rate and Rhythm: Regular rhythm. Tachycardia present.  Pulmonary:     Effort: Pulmonary effort is normal.     Breath sounds: Normal breath sounds.  Abdominal:     General: Bowel sounds are normal.     Palpations: Abdomen is soft.  Skin:    General: Skin is warm and dry.  Neurological:     Mental Status: She is alert.       Interventions   Assessment/Plan: #Palpitations EKG --> Sinus tachycardia without ischemic changes Place patient on ordered telemetry Atarax  PRN Check Troponin with AM labs       To reach the provider On-Call:   7AM- 7PM see care teams to locate the attending and reach out to them via [email or web address] Password: [email or web address] 7PM-7AM contact night-coverage If you still have difficulty reaching the appropriate provider, please page the [facility] (Director on Call) for [facility] on amion for assistance  This document was prepared using Conservation officer, historic buildings and may include unintentional dictation errors.  Rockie Rams  FNP-BC, PMHNP-BC Nurse Practitioner [facility] [place]  "

## 2024-08-12 NOTE — Progress Notes (Signed)
 PHARMACY - ANTICOAGULATION CONSULT NOTE  Pharmacy Consult for warfarin Indication: On-X mechanical aortic valve  Allergies[1]  Patient Measurements: Height: 5' 4 (162.6 cm) Weight: 113 kg (249 lb 1.9 oz) IBW/kg (Calculated) : 54.7 HEPARIN  DW (KG): 81.8  Vital Signs: Temp: 99.2 F (37.3 C) (01/27 0801) Temp Source: Oral (01/27 0352) BP: 113/72 (01/27 0801) Pulse Rate: 61 (01/27 0801)  Labs: Recent Labs    08/11/24 0339 08/11/24 1006 08/12/24 0542  HGB 13.4  --  11.5*  HCT 40.0  --  34.7*  PLT 235  --  176  LABPROT 24.6*  --   --   INR 2.1*  --   --   CREATININE 0.81 0.70 0.69    Estimated Creatinine Clearance: 112.8 mL/min (by C-G formula based on SCr of 0.69 mg/dL).   Medical History: Past Medical History:  Diagnosis Date   Arthritis    Asthma    CHF (congestive heart failure) (HCC)    Chronic pain disorder 03/19/2018   Diabetes mellitus without complication (HCC)    type 2   GERD (gastroesophageal reflux disease)    Headache    Heart murmur    History of trichomoniasis 03/19/2018   Hypertension    Intractable nausea and vomiting    Pancreatitis    PID (pelvic inflammatory disease) 02/14/2018   Uterine leiomyoma 03/19/2018   Assessment: 44 y/o female presenting with LUQ abdominal pain. PMH significant for asthma, osteoarthritis, CHF, type 2 diabetes mellitus, status post aortic valve replacement on Coumadin , GERD, which essential hypertension and chronic pancreatitis. Pharmacy has been consulted to resume and manage warfarin.  Home regimen: warfarin 3.375 mg (26.25 mg weekly dose) Last warfarin dose PTA: 3.375 mg on 1/25 @ 1200 Baseline labs:: hgb 13.4, plt 235, INR 2.1  Date INR Warfarin Dose 1/27 2.1 3.375 mg 1/28 3.7 Held  Goal of Therapy:  INR 1.5 - 2.0 (On-X valve) Monitor platelets by anticoagulation protocol: Yes   Plan:  INR supratherapeutic at 3.7 - per RN, no signs/symptoms of bleeding, hgb 11.5, plt 176 Will hold warfarin dose today  and re-check in AM Daily INR while inpatient CBC at least every 72 hours per protocol  Thank you for involving pharmacy in this patient's care.   Damien Napoleon, PharmD Clinical Pharmacist 08/12/2024 8:20 AM     [1]  Allergies Allergen Reactions   Latex Anaphylaxis, Hives and Itching    Other reaction(s): Other (See Comments) SKIN BURNING & PEELING    Lisinopril  Swelling    Other Reaction(s): Angioedema   Nsaids Other (See Comments)    GI Ulcers   Tomato Anaphylaxis, Hives and Itching   Gabapentin  Palpitations   Metformin      Other Reaction(s): Abdominal Pain  Causes pancreatitis to worsen   Semaglutide     Other Reaction(s): Other (See Comments)    Acute on chronic pancreatitis 09/2023   Duloxetine Anxiety    Makes her jittery   Tramadol Hives and Itching

## 2024-08-12 NOTE — TOC CM/SW Note (Signed)
 Transition of Care [facility]) - Inpatient Brief Assessment   Patient Details  Name: Sarah Sosa MRN: [id number] Date of Birth: 23-Apr-1981  Transition of Care [facility]) CM/SW Contact:    Daved JONETTA Hamilton, RN Phone Number: 08/12/2024, 6:16 PM   Clinical Narrative:   Transition of Care (TOC) Screening Note   Patient Details  Name: Sarah Sosa Date of Birth: 01-31-1981   Transition of Care [facility]) CM/SW Contact:    Daved JONETTA Hamilton, RN Phone Number: 08/12/2024, 6:16 PM   Social resources added to AVS ReAdmission Screen Complete Transition of Care Department [facility]) has reviewed patient and no TOC needs have been identified at this time.  If new patient transition needs arise, please place a TOC consult.    Transition of Care Asessment: Insurance and Status: Insurance coverage has been reviewed Patient has primary care physician: Yes   Prior level of function:: Independent Prior/Current Home Services: No current home services Social Drivers of Health Review: SDOH reviewed interventions complete Readmission risk has been reviewed: Yes (Readmission screen complete) Transition of care needs: no transition of care needs at this time

## 2024-08-13 DIAGNOSIS — K859 Acute pancreatitis without necrosis or infection, unspecified: Secondary | ICD-10-CM | POA: Diagnosis not present

## 2024-08-13 DIAGNOSIS — K861 Other chronic pancreatitis: Secondary | ICD-10-CM | POA: Diagnosis not present

## 2024-08-13 LAB — BASIC METABOLIC PANEL WITH GFR
Anion gap: 9 (ref 5–15)
BUN: 8 mg/dL (ref 6–20)
CO2: 27 mmol/L (ref 22–32)
Calcium: 9.2 mg/dL (ref 8.9–10.3)
Chloride: 103 mmol/L (ref 98–111)
Creatinine, Ser: 0.86 mg/dL (ref 0.44–1.00)
GFR, Estimated: >60 mL/min
Glucose, Bld: 162 mg/dL — ABNORMAL HIGH (ref 70–99)
Potassium: 3.6 mmol/L (ref 3.5–5.1)
Sodium: 139 mmol/L (ref 135–145)

## 2024-08-13 LAB — PROTIME-INR
INR: 2 — ABNORMAL HIGH (ref 0.8–1.2)
Prothrombin Time: 23.5 s — ABNORMAL HIGH (ref 11.4–15.2)

## 2024-08-13 LAB — CBC
HCT: 35.2 % — ABNORMAL LOW (ref 36.0–46.0)
Hemoglobin: 11.8 g/dL — ABNORMAL LOW (ref 12.0–15.0)
MCH: 32.2 pg (ref 26.0–34.0)
MCHC: 33.5 g/dL (ref 30.0–36.0)
MCV: 96.2 fL (ref 80.0–100.0)
Platelets: 172 10*3/uL (ref 150–400)
RBC: 3.66 MIL/uL — ABNORMAL LOW (ref 3.87–5.11)
RDW: 12.8 % (ref 11.5–15.5)
WBC: 5.5 10*3/uL (ref 4.0–10.5)
nRBC: 0 % (ref 0.0–0.2)

## 2024-08-13 LAB — MAGNESIUM: Magnesium: 1.6 mg/dL — ABNORMAL LOW (ref 1.7–2.4)

## 2024-08-13 LAB — MISC LABCORP TEST (SEND OUT): Labcorp test code: 83935

## 2024-08-13 LAB — PHOSPHORUS: Phosphorus: 2.8 mg/dL (ref 2.5–4.6)

## 2024-08-13 LAB — TROPONIN T, HIGH SENSITIVITY: Troponin T High Sensitivity: 16 ng/L (ref 0–19)

## 2024-08-13 MED ORDER — HYDROMORPHONE HCL 1 MG/ML IJ SOLN
1.0000 mg | Freq: Four times a day (QID) | INTRAMUSCULAR | Status: DC | PRN
  Administered 2024-08-13 – 2024-08-14 (×3): 1 mg via INTRAVENOUS
  Filled 2024-08-13 (×3): qty 1

## 2024-08-13 MED ORDER — WARFARIN SODIUM 3 MG PO TABS
3.5000 mg | ORAL_TABLET | Freq: Once | ORAL | Status: AC
  Administered 2024-08-13: 3.5 mg via ORAL
  Filled 2024-08-13: qty 1

## 2024-08-13 MED ORDER — OXYCODONE HCL 5 MG PO TABS
10.0000 mg | ORAL_TABLET | Freq: Four times a day (QID) | ORAL | Status: DC | PRN
  Administered 2024-08-13 – 2024-08-14 (×3): 10 mg via ORAL
  Filled 2024-08-13 (×4): qty 2

## 2024-08-13 MED ORDER — MAGNESIUM SULFATE 2 GM/50ML IV SOLN: 2.0000 g | Freq: Once | INTRAVENOUS | Status: DC

## 2024-08-13 NOTE — Progress Notes (Signed)
" °   08/12/24 2228  Assess: MEWS Score  Temp 98.2 F (36.8 C)  BP (!) 170/108  MAP (mmHg) 127  Pulse Rate (!) 131  Resp (!) 22  SpO2 99 %  O2 Device Room Air  Assess: MEWS Score  MEWS Temp 0  MEWS Systolic 0  MEWS Pulse 3  MEWS RR 1  MEWS LOC 0  MEWS Score 4  MEWS Score Color Red  Assess: if the MEWS score is Yellow or Red  Were vital signs accurate and taken at a resting state? No, vital signs rechecked  Notify: Charge Nurse/RN  Name of Charge Nurse/RN Notified Mliss Heater RN  Provider Notification  Provider Name/Title Izetta Rams NP  Date Provider Notified 08/12/24  Time Provider Notified 2250  Method of Notification Page (secured chat)  Notification Reason Change in status  Provider response See new orders  Date of Provider Response 08/12/24  Time of Provider Response 2255  Assess: SIRS CRITERIA  SIRS Temperature  0  SIRS Respirations  1  SIRS Pulse 1  SIRS WBC 0  SIRS Score Sum  2   Patient was complaining of SOB, chest tightness. hands are shaking, with complain of left arm numbness and she said she feels like she is going to pass out. Shown above was the initial vital signs upon assessment. CBG 182, EKG done shows Sinus Tachycardia.  Patient shows no signs of acute distress. Refused PRN atarax  po as ordered. Care ongoing  "

## 2024-08-13 NOTE — Progress Notes (Signed)
 " Progress Note   Patient: Sarah Sosa FMW:[id number] DOB: 16-Mar-1981 DOA: 08/11/2024     2 DOS: the patient was seen and examined on 08/13/2024   Brief hospital course:  Sarah Sosa is a 44 y.o. African-American female with medical history significant for asthma, osteoarthritis, CHF, type 2 diabetes mellitus, status post aortic valve replacement on Coumadin , GERD, which essential hypertension and pancreatitis, who presented to the emergency room with acute onset of left upper quadrant abdominal pain with associated nausea and vomiting.  Patient admitted for acute on chronic pancreatitis.  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 acute on chronic pancreatitis Pain management with Subutex ,  Continue Dilaudid  and oxycodone  Diet advanced Antiemetics as needed continue Creon    Alcohol use No symptoms/signs of withdrawal, last drink 01/22 FA, MV, thiamine  Monitor for withdrawal   Severe aortic stenosis s/p mechanical AVR 04/10/2022, Ascending Aortic Aneurysm s/p repair 03/2022 Coumadin  and monitor INR, per pharmacy   Chronic diastolic heart failure, HTN & HLD Resume home Lasix , Aldactone  Continue Jardiance, Cozaar , Amlodipine     Hypomagnesemia Hypophosphatemia Monitor electrolytes and replete as needed   GERD PPI, Pepcid    Chronic obstructive pulmonary disease (COPD) Tobacco use bronchodilator therapy. Counseled cessation, refused nicotine  patches/gum   Prediabetes SSI, monitor blood glucose   Chronic pain syndrome Subutex  Follow-up with pain clinic   Obesity class III Body mass index is 42.76 kg/m. Outpatient follow up for lifestyle modification and risk factor management     DVT prophylaxis: Warfarin   Code Status: Full Code Disposition:  Home    Examination: General:  44 y.o.-year-old African-American female patient lying in the bed with no acute distress.  Neck:  Supple, no jugular venous  distention Lungs: Normal breath sounds bilaterally, no wheezing, rales,rhonchi or crepitation Cardio: Regular rate and rhythm, S1, S2 normal. No murmurs, rubs, or gallops.  Abd: Soft, nondistended, with mild epigastric and left upper quadrant abdominal tenderness without rebound tenderness guarding or rigidity Ext: No pedal edema, cyanosis, or clubbing.  Neuro: Cranial nerves II through XII are intact. Muscle strength 5/5 in all extremities Psych: The patient is alert and oriented x 3.  Normal affect and good eye contact.  Subjective: Abdominal pain with meals-better today than yesterday Pain medication adjusted  Vitals:   08/12/24 2333 08/13/24 0344 08/13/24 0813 08/13/24 1530  BP: 134/85 105/62 119/73 116/84  Pulse: 86 60 72 90  Resp: 18 17 18 18   Temp: 98.2 F (36.8 C) 98.4 F (36.9 C) 98.3 F (36.8 C)   TempSrc: Oral  Oral   SpO2: 100% 100% 99% 100%  Weight:      Height:        Data Reviewed:    Latest Ref Rng & Units 08/13/2024    7:21 AM 08/12/2024    5:42 AM 08/11/2024    3:39 AM  CBC  WBC 4.0 - 10.5 K/uL 5.5  3.1  5.9   Hemoglobin 12.0 - 15.0 g/dL 88.1  88.4  86.5   Hematocrit 36.0 - 46.0 % 35.2  34.7  40.0   Platelets 150 - 400 K/uL 172  176  235        Latest Ref Rng & Units 08/13/2024    7:21 AM 08/12/2024    5:42 AM 08/11/2024   10:06 AM  BMP  Glucose 70 - 99 mg/dL 837  877  99   BUN 6 - 20 mg/dL 8  6  8  Creatinine 0.44 - 1.00 mg/dL 9.13  9.30  9.29   Sodium 135 - 145 mmol/L 139  135  136   Potassium 3.5 - 5.1 mmol/L 3.6  3.6  4.6   Chloride 98 - 111 mmol/L 103  101  99   CO2 22 - 32 mmol/L 27  26  25    Calcium  8.9 - 10.3 mg/dL 9.2  8.7  8.2     Author: Drue ONEIDA Potter, MD 08/13/2024 5:08 PM  For on call review [email or web address].  "

## 2024-08-13 NOTE — Plan of Care (Signed)

## 2024-08-13 NOTE — Plan of Care (Signed)
   Problem: Education: Goal: Knowledge of General Education information will improve Description: Including pain rating scale, medication(s)/side effects and non-pharmacologic comfort measures Outcome: Progressing   Problem: Pain Managment: Goal: General experience of comfort will improve and/or be controlled Outcome: Progressing   Problem: Safety: Goal: Ability to remain free from injury will improve Outcome: Progressing

## 2024-08-13 NOTE — Progress Notes (Addendum)
 PHARMACY - ANTICOAGULATION CONSULT NOTE  Pharmacy Consult for warfarin Indication: On-X mechanical aortic valve  Allergies[1]  Patient Measurements: Height: 5' 4 (162.6 cm) Weight: 113 kg (249 lb 1.9 oz) IBW/kg (Calculated) : 54.7 HEPARIN  DW (KG): 81.8  Vital Signs: Temp: 98.4 F (36.9 C) (01/28 0344) Temp Source: Oral (01/27 2333) BP: 105/62 (01/28 0344) Pulse Rate: 60 (01/28 0344)  Labs: Recent Labs    08/11/24 0339 08/11/24 1006 08/12/24 0542 08/12/24 1009 08/13/24 0721  HGB 13.4  --  11.5*  --  11.8*  HCT 40.0  --  34.7*  --  35.2*  PLT 235  --  176  --  172  LABPROT 24.6*  --   --  38.4* 23.5*  INR 2.1*  --   --  3.7* 2.0*  CREATININE 0.81 0.70 0.69  --   --     Estimated Creatinine Clearance: 112.8 mL/min (by C-G formula based on SCr of 0.69 mg/dL).   Medical History: Past Medical History:  Diagnosis Date   Arthritis    Asthma    CHF (congestive heart failure) (HCC)    Chronic pain disorder 03/19/2018   Diabetes mellitus without complication (HCC)    type 2   GERD (gastroesophageal reflux disease)    Headache    Heart murmur    History of trichomoniasis 03/19/2018   Hypertension    Intractable nausea and vomiting    Pancreatitis    PID (pelvic inflammatory disease) 02/14/2018   Uterine leiomyoma 03/19/2018   Assessment: 44 y/o female presenting with LUQ abdominal pain. PMH significant for asthma, osteoarthritis, CHF, type 2 diabetes mellitus, status post aortic valve replacement on Coumadin , GERD, which essential hypertension and chronic pancreatitis. Pharmacy has been consulted to resume and manage warfarin.  Home regimen: warfarin 3.375 mg (26.25 mg weekly dose) Last warfarin dose PTA: 3.375 mg on 1/25 @ 1200 Baseline labs:: hgb 13.4, plt 235, INR 2.1  Date INR Warfarin Dose 1/26 2.1 3.75 mg 1/27 3.7 Held 1/28 2.0 3.5 mg  Goal of Therapy:  INR 1.5 - 2.0 (On-X valve) Monitor platelets by anticoagulation protocol: Yes   Plan:  INR  therapeutic at 2.0 - CBC stable Give warfarin 3.5 mg x1 this afternoon (~ near home dose) Daily INR while inpatient CBC at least every 72 hours per protocol  Thank you for involving pharmacy in this patient's care.   Damien Napoleon, PharmD Clinical Pharmacist 08/13/2024 8:18 AM      [1]  Allergies Allergen Reactions   Latex Anaphylaxis, Hives and Itching    Other reaction(s): Other (See Comments) SKIN BURNING & PEELING    Lisinopril  Swelling    Other Reaction(s): Angioedema   Nsaids Other (See Comments)    GI Ulcers   Tomato Anaphylaxis, Hives and Itching   Gabapentin  Palpitations   Metformin      Other Reaction(s): Abdominal Pain  Causes pancreatitis to worsen   Semaglutide     Other Reaction(s): Other (See Comments)    Acute on chronic pancreatitis 09/2023   Duloxetine Anxiety    Makes her jittery   Tramadol Hives and Itching

## 2024-08-14 DIAGNOSIS — K861 Other chronic pancreatitis: Secondary | ICD-10-CM | POA: Diagnosis not present

## 2024-08-14 DIAGNOSIS — K859 Acute pancreatitis without necrosis or infection, unspecified: Secondary | ICD-10-CM | POA: Diagnosis not present

## 2024-08-14 LAB — BASIC METABOLIC PANEL WITH GFR
Anion gap: 9 (ref 5–15)
BUN: 10 mg/dL (ref 6–20)
CO2: 27 mmol/L (ref 22–32)
Calcium: 9.4 mg/dL (ref 8.9–10.3)
Chloride: 102 mmol/L (ref 98–111)
Creatinine, Ser: 0.91 mg/dL (ref 0.44–1.00)
GFR, Estimated: >60 mL/min
Glucose, Bld: 139 mg/dL — ABNORMAL HIGH (ref 70–99)
Potassium: 3.9 mmol/L (ref 3.5–5.1)
Sodium: 138 mmol/L (ref 135–145)

## 2024-08-14 LAB — CBC WITH DIFFERENTIAL/PLATELET
Abs Immature Granulocytes: 0.02 10*3/uL (ref 0.00–0.07)
Basophils Absolute: 0 10*3/uL (ref 0.0–0.1)
Basophils Relative: 1 %
Eosinophils Absolute: 0.2 10*3/uL (ref 0.0–0.5)
Eosinophils Relative: 4 %
HCT: 35.9 % — ABNORMAL LOW (ref 36.0–46.0)
Hemoglobin: 11.8 g/dL — ABNORMAL LOW (ref 12.0–15.0)
Immature Granulocytes: 0 %
Lymphocytes Relative: 36 %
Lymphs Abs: 2 10*3/uL (ref 0.7–4.0)
MCH: 32.1 pg (ref 26.0–34.0)
MCHC: 32.9 g/dL (ref 30.0–36.0)
MCV: 97.6 fL (ref 80.0–100.0)
Monocytes Absolute: 0.4 10*3/uL (ref 0.1–1.0)
Monocytes Relative: 7 %
Neutro Abs: 2.9 10*3/uL (ref 1.7–7.7)
Neutrophils Relative %: 52 %
Platelets: 171 10*3/uL (ref 150–400)
RBC: 3.68 MIL/uL — ABNORMAL LOW (ref 3.87–5.11)
RDW: 12.8 % (ref 11.5–15.5)
WBC: 5.5 10*3/uL (ref 4.0–10.5)
nRBC: 0 % (ref 0.0–0.2)

## 2024-08-14 LAB — PROTIME-INR
INR: 1.3 — ABNORMAL HIGH (ref 0.8–1.2)
Prothrombin Time: 16.7 s — ABNORMAL HIGH (ref 11.4–15.2)

## 2024-08-14 LAB — MAGNESIUM: Magnesium: 1.2 mg/dL — ABNORMAL LOW (ref 1.7–2.4)

## 2024-08-14 MED ORDER — MAGNESIUM SULFATE 2 GM/50ML IV SOLN
2.0000 g | Freq: Once | INTRAVENOUS | Status: AC
  Administered 2024-08-14: 2 g via INTRAVENOUS
  Filled 2024-08-14: qty 50

## 2024-08-14 MED ORDER — WARFARIN SODIUM 5 MG PO TABS
5.0000 mg | ORAL_TABLET | Freq: Once | ORAL | Status: DC
  Filled 2024-08-14: qty 1

## 2024-08-14 NOTE — Progress Notes (Signed)
 PHARMACY - ANTICOAGULATION CONSULT NOTE  Pharmacy Consult for warfarin Indication: On-X mechanical aortic valve  Allergies[1]  Patient Measurements: Height: 5' 4 (162.6 cm) Weight: 113 kg (249 lb 1.9 oz) IBW/kg (Calculated) : 54.7 HEPARIN  DW (KG): 81.8  Vital Signs: Temp: 97.9 F (36.6 C) (01/29 0822) BP: 144/81 (01/29 0822) Pulse Rate: 64 (01/29 0822)  Labs: Recent Labs    08/11/24 1006 08/12/24 0542 08/12/24 0542 08/12/24 1009 08/13/24 0721 08/14/24 0853  HGB  --  11.5*   < >  --  11.8* 11.8*  HCT  --  34.7*  --   --  35.2* 35.9*  PLT  --  176  --   --  172 171  LABPROT  --   --   --  38.4* 23.5* 16.7*  INR  --   --   --  3.7* 2.0* 1.3*  CREATININE 0.70 0.69  --   --  0.86  --    < > = values in this interval not displayed.    Estimated Creatinine Clearance: 104.9 mL/min (by C-G formula based on SCr of 0.86 mg/dL).   Medical History: Past Medical History:  Diagnosis Date   Arthritis    Asthma    CHF (congestive heart failure) (HCC)    Chronic pain disorder 03/19/2018   Diabetes mellitus without complication (HCC)    type 2   GERD (gastroesophageal reflux disease)    Headache    Heart murmur    History of trichomoniasis 03/19/2018   Hypertension    Intractable nausea and vomiting    Pancreatitis    PID (pelvic inflammatory disease) 02/14/2018   Uterine leiomyoma 03/19/2018   Assessment: 44 y/o female presenting with LUQ abdominal pain. PMH significant for asthma, osteoarthritis, CHF, type 2 diabetes mellitus, status post aortic valve replacement on Coumadin , GERD, which essential hypertension and chronic pancreatitis. Pharmacy has been consulted to resume and manage warfarin.  Home regimen: warfarin 3.375 mg (26.25 mg weekly dose) Last warfarin dose PTA: 3.375 mg on 1/25 @ 1200 Baseline labs:: hgb 13.4, plt 235, INR 2.1  Date INR Warfarin Dose 1/26 2.1 3.75 mg 1/27 3.7 Held 1/28 2.0 3.5 mg 1/29 1.3 5 mg  Goal of Therapy:  INR 1.5 - 2.0 (On-X  valve) Monitor platelets by anticoagulation protocol: Yes   Plan:  INR slightly subtherapeutic this AM and trending down - CBC stable Give warfarin 5 mg x1 this afternoon (~1.5x home dose) Daily INR while inpatient CBC at least every 72 hours per protocol  Thank you for involving pharmacy in this patient's care.   Damien Napoleon, PharmD Clinical Pharmacist 08/14/2024 9:23 AM    [1]  Allergies Allergen Reactions   Latex Anaphylaxis, Hives and Itching    Other reaction(s): Other (See Comments) SKIN BURNING & PEELING    Lisinopril  Swelling    Other Reaction(s): Angioedema   Nsaids Other (See Comments)    GI Ulcers   Tomato Anaphylaxis, Hives and Itching   Gabapentin  Palpitations   Metformin      Other Reaction(s): Abdominal Pain  Causes pancreatitis to worsen   Semaglutide     Other Reaction(s): Other (See Comments)    Acute on chronic pancreatitis 09/2023   Duloxetine Anxiety    Makes her jittery   Tramadol Hives and Itching

## 2024-08-14 NOTE — Discharge Summary (Signed)
 " Physician Discharge Summary   Patient: Sarah Sosa MRN: [id number] DOB: [birth year]  Admit date:     08/11/2024  Discharge date: 08/14/24  Discharge Physician: Drue ONEIDA Potter   PCP: [facility]   Recommendations at discharge:  Follow-up with primary care physician  Discharge Diagnoses: Principal Problem:   Acute on chronic pancreatitis (HCC) Active Problems:   Hypomagnesemia   (HFpEF) heart failure with preserved ejection fraction (HCC)   Status post aortic valve replacement with metallic valve   Dyslipidemia   Chronic pain syndrome   Controlled diabetes mellitus type II without complication (HCC)   Chronic obstructive pulmonary disease (COPD) (HCC)  Resolved Problems:   * No resolved hospital problems. *  Hospital Course:  Sarah Sosa is a 44 y.o. African-American female with medical history significant for asthma, osteoarthritis, CHF, type 2 diabetes mellitus, status post aortic valve replacement on Coumadin , GERD, which essential hypertension and pancreatitis, who presented to the emergency room with acute onset of left upper quadrant abdominal pain with associated nausea and vomiting.  Patient admitted for acute on chronic pancreatitis.  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 acute on chronic pancreatitis Pain management with Subutex ,  Continue Dilaudid  and oxycodone  Diet advanced Antiemetics as needed continue Creon    Alcohol use No symptoms/signs of withdrawal, last drink 01/22 FA, MV, thiamine  Monitor for withdrawal   Severe aortic stenosis s/p mechanical AVR 04/10/2022, Ascending Aortic Aneurysm s/p repair 03/2022 Coumadin  and monitor INR, per pharmacy   Chronic diastolic heart failure, HTN & HLD Resume home Lasix , Aldactone  Continue Jardiance, Cozaar , Amlodipine     Hypomagnesemia Hypophosphatemia Monitor electrolytes and replete as needed   GERD PPI, Pepcid    Chronic obstructive  pulmonary disease (COPD) Tobacco use bronchodilator therapy. Counseled cessation, refused nicotine  patches/gum   Prediabetes SSI, monitor blood glucose   Chronic pain syndrome Subutex  Follow-up with pain clinic   Obesity class III Body mass index is 42.76 kg/m. Outpatient follow up for lifestyle modification and risk factor management   Consultants: None Procedures performed: None Disposition: Home Diet recommendation:  Cardiac diet DISCHARGE MEDICATION: Allergies as of 08/14/2024       Reactions   Latex Anaphylaxis, Hives, Itching   Other reaction(s): Other (See Comments) SKIN BURNING & PEELING   Lisinopril  Swelling   Other Reaction(s): Angioedema   Nsaids Other (See Comments)   GI Ulcers   Tomato Anaphylaxis, Hives, Itching   Gabapentin  Palpitations   Metformin     Other Reaction(s): Abdominal Pain Causes pancreatitis to worsen   Semaglutide    Other Reaction(s): Other (See Comments)    Acute on chronic pancreatitis 09/2023   Duloxetine Anxiety   Makes her jittery   Tramadol Hives, Itching        Medication List     STOP taking these medications    HYDROmorphone  2 MG tablet Commonly known as: DILAUDID    omeprazole 40 MG capsule Commonly known as: PRILOSEC       TAKE these medications    albuterol  108 (90 Base) MCG/ACT inhaler Commonly known as: VENTOLIN  HFA Inhale 1-2 puffs into the lungs every 6 (six) hours as needed.   amLODipine  10 MG tablet Commonly known as: NORVASC  Take 1 tablet (10 mg total) by mouth daily.   atorvastatin  40 MG tablet Commonly known as: LIPITOR Take 40 mg by mouth daily.   buprenorphine  8 MG Subl SL tablet Commonly known as: SUBUTEX  Place 8 mg under the tongue. Place 1 tablet (8  mg total) under the tongue 3 (three) times daily   famotidine  20 MG tablet Commonly known as: PEPCID  Take 20 mg by mouth 2 (two) times daily.   folic acid  1 MG tablet Commonly known as: FOLVITE  Take 1 mg by mouth daily.    furosemide  40 MG tablet Commonly known as: LASIX  Take 0.5 tablets (20 mg total) by mouth daily as needed. Home med. What changed:  how much to take reasons to take this   lipase/protease/amylase 63999 UNITS Cpep capsule Commonly known as: CREON  Take 1 capsule (36,000 Units total) by mouth 3 (three) times daily with meals. What changed: how much to take   losartan  50 MG tablet Commonly known as: COZAAR  Take 1 tablet (50 mg total) by mouth daily.   magnesium  chloride 64 MG Tbec SR tablet Commonly known as: SLOW-MAG Take 1 tablet (64 mg total) by mouth daily.   multivitamin with minerals Tabs tablet Take 1 tablet by mouth daily.   naloxone  4 MG/0.1ML Liqd nasal spray kit Commonly known as: NARCAN  Place 1 spray into the nose once as needed (to reverse overdose).   ondansetron  4 MG disintegrating tablet Commonly known as: ZOFRAN -ODT Take 4 mg by mouth every 8 (eight) hours as needed for nausea.   oxyCODONE  5 MG immediate release tablet Commonly known as: Oxy IR/ROXICODONE  Take 5 mg by mouth every 6 (six) hours as needed.   polyethylene glycol 17 g packet Commonly known as: MIRALAX  / GLYCOLAX  Take 17 g by mouth daily as needed for mild constipation.   potassium chloride  20 MEQ/15ML (10%) Soln Take 20 mEq by mouth 2 (two) times daily.   spironolactone  25 MG tablet Commonly known as: ALDACTONE  Take 25 mg by mouth daily.   traZODone  50 MG tablet Commonly known as: DESYREL  Take 100 mg by mouth at bedtime.   Vitamin D  (Ergocalciferol ) 1.25 MG (50000 UNIT) Caps capsule Commonly known as: DRISDOL  Take 1 capsule (50,000 Units total) by mouth every 7 (seven) days.   warfarin 7.5 MG tablet Commonly known as: COUMADIN  Take 0.5 tablets (3.75 mg total) by mouth daily.        Discharge Exam: Filed Weights   08/11/24 0331  Weight: 113 kg   Neck:  Supple, no jugular venous distention Lungs: Normal breath sounds bilaterally, no wheezing, rales,rhonchi or  crepitation Cardio: Regular rate and rhythm, S1, S2 normal. No murmurs, rubs, or gallops.  Abd: Soft, nondistended, with mild epigastric and left upper quadrant abdominal tenderness without rebound tenderness guarding or rigidity Ext: No pedal edema, cyanosis, or clubbing.  Neuro: Cranial nerves II through XII are intact. Muscle strength 5/5 in all extremities Psych: The patient is alert and oriented x 3.  Normal affect and good eye contact.  Condition at discharge: good  The results of significant diagnostics from this hospitalization (including imaging, microbiology, ancillary and laboratory) are listed below for reference.   Imaging Studies: DG Chest 2 View Result Date: 07/26/2024 CLINICAL DATA:  Chest pain.  Fever.  Cough. EXAM: CHEST - 2 VIEW COMPARISON:  07/12/2024 FINDINGS: The heart size and mediastinal contours are within normal limits. Prior aortic valve replacement again noted. Both lungs are clear. The visualized skeletal structures are unremarkable. IMPRESSION: No active cardiopulmonary disease. Electronically Signed   By: Norleen DELENA Kil M.D.   On: 07/26/2024 14:09    Microbiology: Results for orders placed or performed during the hospital encounter of 07/26/24  Resp panel by RT-PCR (RSV, Flu A&B, Covid) Anterior Nasal Swab     Status: None  Collection Time: 07/26/24  1:30 PM   Specimen: Anterior Nasal Swab  Result Value Ref Range Status   SARS Coronavirus 2 by RT PCR NEGATIVE NEGATIVE Final    Comment: (NOTE) SARS-CoV-2 target nucleic acids are NOT DETECTED.  The SARS-CoV-2 RNA is generally detectable in upper respiratory specimens during the acute phase of infection. The lowest concentration of SARS-CoV-2 viral copies this assay can detect is 138 copies/mL. A negative result does not preclude SARS-Cov-2 infection and should not be used as the sole basis for treatment or other patient management decisions. A negative result may occur with  improper specimen  collection/handling, submission of specimen other than nasopharyngeal swab, presence of viral mutation(s) within the areas targeted by this assay, and inadequate number of viral copies(<138 copies/mL). A negative result must be combined with clinical observations, patient history, and epidemiological information. The expected result is Negative.  Fact Sheet for Patients:  bloggercourse.com  Fact Sheet for Healthcare Providers:  seriousbroker.it  This test is no t yet approved or cleared by the [place]  FDA and  has been authorized for detection and/or diagnosis of SARS-CoV-2 by FDA under an Emergency Use Authorization (EUA). This EUA will remain  in effect (meaning this test can be used) for the duration of the COVID-19 declaration under Section 564(b)(1) of the Act, 21 U.S.C.section 360bbb-3(b)(1), unless the authorization is terminated  or revoked sooner.       Influenza A by PCR NEGATIVE NEGATIVE Final   Influenza B by PCR NEGATIVE NEGATIVE Final    Comment: (NOTE) The Xpert Xpress SARS-CoV-2/FLU/RSV plus assay is intended as an aid in the diagnosis of influenza from Nasopharyngeal swab specimens and should not be used as a sole basis for treatment. Nasal washings and aspirates are unacceptable for Xpert Xpress SARS-CoV-2/FLU/RSV testing.  Fact Sheet for Patients: bloggercourse.com  Fact Sheet for Healthcare Providers: seriousbroker.it  This test is not yet approved or cleared by the [place]  FDA and has been authorized for detection and/or diagnosis of SARS-CoV-2 by FDA under an Emergency Use Authorization (EUA). This EUA will remain in effect (meaning this test can be used) for the duration of the COVID-19 declaration under Section 564(b)(1) of the Act, 21 U.S.C. section 360bbb-3(b)(1), unless the authorization is terminated or revoked.     Resp Syncytial  Virus by PCR NEGATIVE NEGATIVE Final    Comment: (NOTE) Fact Sheet for Patients: bloggercourse.com  Fact Sheet for Healthcare Providers: seriousbroker.it  This test is not yet approved or cleared by the [place]  FDA and has been authorized for detection and/or diagnosis of SARS-CoV-2 by FDA under an Emergency Use Authorization (EUA). This EUA will remain in effect (meaning this test can be used) for the duration of the COVID-19 declaration under Section 564(b)(1) of the Act, 21 U.S.C. section 360bbb-3(b)(1), unless the authorization is terminated or revoked.  Performed at [facility], [address] [place], [place] [address]     Labs: CBC: Recent Labs  Lab 08/11/24 [phone] 08/12/24 0542 08/13/24 0721 08/14/24 0853  WBC 5.9 3.1* 5.5 5.5  NEUTROABS  --   --   --  2.9  HGB 13.4 11.5* 11.8* 11.8*  HCT 40.0 34.7* 35.2* 35.9*  MCV 95.9 96.7 96.2 97.6  PLT 235 176 172 171   Basic Metabolic Panel: Recent Labs  Lab 08/11/24 0339 08/11/24 1006 08/12/24 0542 08/13/24 0721 08/14/24 0853  NA 137 136 135 139 138  K 3.8 4.6 3.6 3.6 3.9  CL 96* 99 101 103 102  CO2 22 25 26 27 27   GLUCOSE 150* 99 122* 162* 139*  BUN 9 8 6 8 10   CREATININE 0.81 0.70 0.69 0.86 0.91  CALCIUM  9.2 8.2* 8.7* 9.2 9.4  MG 0.8* 2.0 1.4* 1.6* 1.2*  PHOS  --  2.2* 3.0 2.8  --    Liver Function Tests: Recent Labs  Lab 08/11/24 0339  AST 51*  ALT 16  ALKPHOS 89  BILITOT 1.1  PROT 7.8  ALBUMIN 4.3   CBG: Recent Labs  Lab 08/12/24 2230  GLUCAP 185*    Discharge time spent:  37 minutes.  Signed: Drue ONEIDA Potter, MD [facility] 08/14/2024 "

## 2024-08-14 NOTE — Plan of Care (Signed)
